# Patient Record
Sex: Female | Born: 1940
Health system: Southern US, Community
[De-identification: ages and names within clinical notes are randomized; demographics above are authoritative.]

## PROBLEM LIST (undated history)

## (undated) DIAGNOSIS — L03113 Cellulitis of right upper limb: Secondary | ICD-10-CM

## (undated) DIAGNOSIS — Z889 Allergy status to unspecified drugs, medicaments and biological substances status: Secondary | ICD-10-CM

## (undated) DIAGNOSIS — K219 Gastro-esophageal reflux disease without esophagitis: Secondary | ICD-10-CM

## (undated) DIAGNOSIS — Z9289 Personal history of other medical treatment: Secondary | ICD-10-CM

## (undated) DIAGNOSIS — Z950 Presence of cardiac pacemaker: Secondary | ICD-10-CM

## (undated) DIAGNOSIS — G43909 Migraine, unspecified, not intractable, without status migrainosus: Secondary | ICD-10-CM

## (undated) DIAGNOSIS — I480 Paroxysmal atrial fibrillation: Secondary | ICD-10-CM

## (undated) DIAGNOSIS — K862 Cyst of pancreas: Secondary | ICD-10-CM

## (undated) DIAGNOSIS — D649 Anemia, unspecified: Secondary | ICD-10-CM

## (undated) DIAGNOSIS — I10 Essential (primary) hypertension: Secondary | ICD-10-CM

## (undated) DIAGNOSIS — R079 Chest pain, unspecified: Secondary | ICD-10-CM

## (undated) DIAGNOSIS — E0590 Thyrotoxicosis, unspecified without thyrotoxic crisis or storm: Secondary | ICD-10-CM

## (undated) DIAGNOSIS — M1990 Unspecified osteoarthritis, unspecified site: Secondary | ICD-10-CM

## (undated) DIAGNOSIS — E232 Diabetes insipidus: Secondary | ICD-10-CM

## (undated) DIAGNOSIS — Z87442 Personal history of urinary calculi: Secondary | ICD-10-CM

## (undated) DIAGNOSIS — F419 Anxiety disorder, unspecified: Secondary | ICD-10-CM

## (undated) DIAGNOSIS — E785 Hyperlipidemia, unspecified: Secondary | ICD-10-CM

## (undated) DIAGNOSIS — C641 Malignant neoplasm of right kidney, except renal pelvis: Secondary | ICD-10-CM

## (undated) DIAGNOSIS — K589 Irritable bowel syndrome without diarrhea: Secondary | ICD-10-CM

## (undated) DIAGNOSIS — C3490 Malignant neoplasm of unspecified part of unspecified bronchus or lung: Secondary | ICD-10-CM

## (undated) DIAGNOSIS — IMO0002 Reserved for concepts with insufficient information to code with codable children: Secondary | ICD-10-CM

## (undated) HISTORY — DX: Essential (primary) hypertension: I10

## (undated) HISTORY — DX: Paroxysmal atrial fibrillation: I48.0

## (undated) HISTORY — DX: Migraine, unspecified, not intractable, without status migrainosus: G43.909

## (undated) HISTORY — DX: Reserved for concepts with insufficient information to code with codable children: IMO0002

## (undated) HISTORY — DX: Personal history of urinary calculi: Z87.442

## (undated) HISTORY — PX: CERVICAL SPINE SURGERY: SHX589

## (undated) HISTORY — PX: HAMMER TOE SURGERY: SHX385

## (undated) HISTORY — PX: BREAST SURGERY: SHX581

## (undated) HISTORY — PX: HERNIA REPAIR: SHX51

## (undated) HISTORY — DX: Personal history of other medical treatment: Z92.89

## (undated) HISTORY — DX: Hyperlipidemia, unspecified: E78.5

## (undated) HISTORY — DX: Diabetes insipidus: E23.2

## (undated) HISTORY — DX: Irritable bowel syndrome, unspecified: K58.9

## (undated) HISTORY — DX: Anemia, unspecified: D64.9

## (undated) HISTORY — PX: TUBAL LIGATION: SHX77

## (undated) HISTORY — PX: ABDOMINAL HYSTERECTOMY: SHX81

## (undated) HISTORY — DX: Cellulitis of right upper limb: L03.113

## (undated) HISTORY — PX: COLONOSCOPY: SHX174

---
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
## 2000-11-02 ENCOUNTER — Emergency Department (HOSPITAL_COMMUNITY): Admission: EM | Admit: 2000-11-02 | Discharge: 2000-11-02 | Payer: Self-pay

## 2002-11-17 ENCOUNTER — Encounter: Payer: Self-pay | Admitting: Cardiology

## 2002-11-23 ENCOUNTER — Encounter: Payer: Self-pay | Admitting: *Deleted

## 2002-11-23 ENCOUNTER — Ambulatory Visit (HOSPITAL_COMMUNITY): Admission: RE | Admit: 2002-11-23 | Discharge: 2002-11-24 | Payer: Self-pay | Admitting: Cardiology

## 2002-11-23 ENCOUNTER — Encounter: Payer: Self-pay | Admitting: Cardiology

## 2002-12-08 ENCOUNTER — Encounter: Payer: Self-pay | Admitting: Cardiology

## 2003-08-06 HISTORY — PX: BREAST EXCISIONAL BIOPSY: SUR124

## 2004-04-06 ENCOUNTER — Ambulatory Visit (HOSPITAL_COMMUNITY): Admission: RE | Admit: 2004-04-06 | Discharge: 2004-04-06 | Payer: Self-pay | Admitting: Internal Medicine

## 2004-09-06 ENCOUNTER — Ambulatory Visit: Payer: Self-pay | Admitting: Internal Medicine

## 2004-09-07 ENCOUNTER — Ambulatory Visit (HOSPITAL_COMMUNITY): Admission: RE | Admit: 2004-09-07 | Discharge: 2004-09-07 | Payer: Self-pay | Admitting: Internal Medicine

## 2004-12-17 ENCOUNTER — Ambulatory Visit: Payer: Self-pay | Admitting: Internal Medicine

## 2004-12-24 ENCOUNTER — Encounter: Payer: Self-pay | Admitting: Cardiology

## 2005-04-01 ENCOUNTER — Ambulatory Visit (HOSPITAL_COMMUNITY): Admission: RE | Admit: 2005-04-01 | Discharge: 2005-04-01 | Payer: Self-pay | Admitting: Internal Medicine

## 2005-04-23 ENCOUNTER — Ambulatory Visit (HOSPITAL_COMMUNITY): Admission: RE | Admit: 2005-04-23 | Discharge: 2005-04-23 | Payer: Self-pay | Admitting: Ophthalmology

## 2005-07-11 ENCOUNTER — Ambulatory Visit: Payer: Self-pay | Admitting: Internal Medicine

## 2006-02-19 ENCOUNTER — Ambulatory Visit: Payer: Self-pay | Admitting: Internal Medicine

## 2006-02-25 ENCOUNTER — Ambulatory Visit (HOSPITAL_COMMUNITY): Admission: RE | Admit: 2006-02-25 | Discharge: 2006-02-25 | Payer: Self-pay | Admitting: Internal Medicine

## 2006-07-11 ENCOUNTER — Ambulatory Visit: Payer: Self-pay | Admitting: Internal Medicine

## 2006-09-04 ENCOUNTER — Ambulatory Visit (HOSPITAL_COMMUNITY): Admission: RE | Admit: 2006-09-04 | Discharge: 2006-09-04 | Payer: Self-pay | Admitting: Internal Medicine

## 2006-09-04 ENCOUNTER — Ambulatory Visit: Payer: Self-pay | Admitting: Internal Medicine

## 2006-09-19 ENCOUNTER — Ambulatory Visit: Payer: Self-pay | Admitting: Internal Medicine

## 2007-01-19 ENCOUNTER — Ambulatory Visit: Payer: Self-pay | Admitting: Gastroenterology

## 2007-12-23 ENCOUNTER — Encounter: Admission: RE | Admit: 2007-12-23 | Discharge: 2007-12-23 | Payer: Self-pay | Admitting: Gastroenterology

## 2008-02-17 ENCOUNTER — Encounter: Admission: RE | Admit: 2008-02-17 | Discharge: 2008-02-17 | Payer: Self-pay | Admitting: Gastroenterology

## 2008-05-11 ENCOUNTER — Ambulatory Visit (HOSPITAL_COMMUNITY): Admission: RE | Admit: 2008-05-11 | Discharge: 2008-05-11 | Payer: Self-pay | Admitting: Internal Medicine

## 2008-06-09 ENCOUNTER — Ambulatory Visit (HOSPITAL_COMMUNITY): Admission: RE | Admit: 2008-06-09 | Discharge: 2008-06-09 | Payer: Self-pay | Admitting: Internal Medicine

## 2008-07-06 ENCOUNTER — Encounter: Admission: RE | Admit: 2008-07-06 | Discharge: 2008-07-06 | Payer: Self-pay | Admitting: Gastroenterology

## 2008-08-19 ENCOUNTER — Ambulatory Visit (HOSPITAL_COMMUNITY): Admission: RE | Admit: 2008-08-19 | Discharge: 2008-08-19 | Payer: Self-pay | Admitting: Gastroenterology

## 2008-08-22 ENCOUNTER — Ambulatory Visit (HOSPITAL_COMMUNITY): Admission: RE | Admit: 2008-08-22 | Discharge: 2008-08-22 | Payer: Self-pay | Admitting: Ophthalmology

## 2009-05-15 ENCOUNTER — Ambulatory Visit: Payer: Self-pay | Admitting: Endocrinology

## 2009-05-15 DIAGNOSIS — IMO0002 Reserved for concepts with insufficient information to code with codable children: Secondary | ICD-10-CM

## 2009-05-15 DIAGNOSIS — E890 Postprocedural hypothyroidism: Secondary | ICD-10-CM

## 2009-05-15 DIAGNOSIS — F17200 Nicotine dependence, unspecified, uncomplicated: Secondary | ICD-10-CM

## 2009-05-15 DIAGNOSIS — E876 Hypokalemia: Secondary | ICD-10-CM

## 2009-05-15 DIAGNOSIS — I10 Essential (primary) hypertension: Secondary | ICD-10-CM

## 2009-05-15 DIAGNOSIS — F411 Generalized anxiety disorder: Secondary | ICD-10-CM

## 2009-05-15 LAB — CONVERTED CEMR LAB: TSH: 0.87 microintl units/mL (ref 0.35–5.50)

## 2009-08-10 ENCOUNTER — Encounter: Admission: RE | Admit: 2009-08-10 | Discharge: 2009-08-10 | Payer: Self-pay | Admitting: Gastroenterology

## 2009-08-21 ENCOUNTER — Encounter: Payer: Self-pay | Admitting: Cardiology

## 2009-09-18 ENCOUNTER — Encounter: Payer: Self-pay | Admitting: Cardiology

## 2009-09-19 ENCOUNTER — Telehealth (INDEPENDENT_AMBULATORY_CARE_PROVIDER_SITE_OTHER): Payer: Self-pay | Admitting: *Deleted

## 2009-09-27 ENCOUNTER — Encounter (INDEPENDENT_AMBULATORY_CARE_PROVIDER_SITE_OTHER): Payer: Self-pay | Admitting: *Deleted

## 2009-09-27 ENCOUNTER — Ambulatory Visit: Payer: Self-pay | Admitting: Cardiology

## 2009-09-27 DIAGNOSIS — I340 Nonrheumatic mitral (valve) insufficiency: Secondary | ICD-10-CM | POA: Insufficient documentation

## 2009-09-27 DIAGNOSIS — R0789 Other chest pain: Secondary | ICD-10-CM

## 2009-10-03 ENCOUNTER — Encounter: Payer: Self-pay | Admitting: Cardiology

## 2009-10-03 ENCOUNTER — Ambulatory Visit: Payer: Self-pay | Admitting: Cardiology

## 2009-10-10 ENCOUNTER — Encounter: Payer: Self-pay | Admitting: Cardiology

## 2009-10-11 ENCOUNTER — Ambulatory Visit (HOSPITAL_COMMUNITY): Admission: RE | Admit: 2009-10-11 | Discharge: 2009-10-11 | Payer: Self-pay | Admitting: Unknown Physician Specialty

## 2009-10-12 ENCOUNTER — Encounter (INDEPENDENT_AMBULATORY_CARE_PROVIDER_SITE_OTHER): Payer: Self-pay | Admitting: *Deleted

## 2009-10-17 ENCOUNTER — Encounter: Payer: Self-pay | Admitting: Cardiology

## 2009-10-17 ENCOUNTER — Ambulatory Visit: Payer: Self-pay | Admitting: Cardiology

## 2009-10-30 ENCOUNTER — Telehealth (INDEPENDENT_AMBULATORY_CARE_PROVIDER_SITE_OTHER): Payer: Self-pay | Admitting: *Deleted

## 2010-09-04 NOTE — Assessment & Plan Note (Signed)
Summary: np-cardiac eval   Visit Type:  Initial Consult Referring Provider:  Dr. Ebbie Ridge Primary Provider:  [name], MD  CC:  Heart Murmur/HTN.  History of Present Illness: The patient is referred for cardiac evaluation after she was noted to have a heart murmur and difficult to control hypertension. Her past cardiac history includes a catheterization demonstrating mild coronary plaque in 2004.  She recently saw a new primary physician in [place] where her daughter lives. The predominant complaint has been weight loss. She does describe some chest "tightness". This happened sporadically. She also has a "catching" discomfort that happens more with certain movements. The chest tightness is mild and sporadic. It is short lived and not associated with nausea vomiting or diaphoresis. She does not describe palpitations, presyncope or syncope. She does not describe shortness of breath, PND or orthopnea. Of note she reports fluctuating blood pressures but it sounds like it typically runs slightly high despite her ongoing therapy.  Preventive Screening-Counseling & Management  Alcohol-Tobacco     Smoking Status: current     Packs/Day: 0.5  Comments: Smoked off/on about 25 yrs  Current Medications (verified): 1)  Potassium Chloride Crys Cr 20 Meq Cr-Tabs (Potassium Chloride Crys Cr) .... Take Two Tablets By Mouth Twice A Day 2)  Propranolol Hcl 80 Mg Tabs (Propranolol Hcl) .... Take 1 Tablet By Mouth Once A Day 3)  Alprazolam 0.5 Mg Tabs (Alprazolam) .Marland Kitchen.. 1 Tab 3 Times Daily 4)  Indapamide 2.5 Mg Tabs (Indapamide) .... Take 1 Tablet By Mouth Once A Day 5)  Levothyroxine Sodium 88 Mcg Tabs (Levothyroxine Sodium) .... Take 1 Tablet By Mouth Once A Day 6)  Aspirin 81 Mg Tbec (Aspirin) .... Take One Tablet By Mouth Daily 7)  Super Calcium/d 600-125 Mg-Unit Tabs (Calcium-Vitamin D) .... Take 2 Tablets By Mouth Once Daily 8)  Fish Oil 1000 Mg Caps (Omega-3 Fatty Acids) .... Take 1  Capsule By Mouth Once A Day 9)  Tylenol 325 Mg Tabs (Acetaminophen) .... As Needed 10)  Amlodipine Besylate 2.5 Mg Tabs (Amlodipine Besylate) .... Take 1 Tablet By Mouth Once A Day  Allergies (verified): 1)  Codeine 2)  * Tetanus  Comments:  Nurse/Medical Assistant: The patient's medications were reviewed with the patient and were updated in the Medication List. Pt brought medication bottles to office visit.  Cyril Loosen, RN, BSN (September 27, 2009 1:51 PM)  Past History:  Past Medical History: HTN x years IBS Herniated Disc Hyperthyroid  Past Surgical History: Tubal Ligation Partial hysterectomy Cervical spine surgery Hammer toe surgery bilateral  Family History: Father "heart bursted" age 72.  Mother died age  85 sudden death.  Two daughters  have uncertain type of thyroid problems  Social History: Widowed Retired Four children Tobacco 1/2 ppd (previous 1ppd) x off and on since age 20sSmoking Status:  current Packs/Day:  0.5  Review of Systems       Wt loss (10 pounds 5 years), insomnia, polyuria.  Otherwis as stated in the HPI and negative for all other systems.  Vital Signs:  Patient profile:   70 year old female Height:      63 inches Weight:      113.50 pounds Pulse rate:   61 / minute BP sitting:   172 / 99  (left arm) Cuff size:   regular  Vitals Entered By: Cyril Loosen, RN, BSN (September 27, 2009 1:45 PM)  Serial Vital Signs/Assessments:  Time      Position  BP  Pulse  Resp  Temp     By 2:42 PM             175/96   60                    Carlye Grippe  CC: Heart Murmur/HTN   Physical Exam  General:  Well developed, well nourished, in no acute distress. Head:  normocephalic and atraumatic Eyes:  PERRLA/EOM intact; conjunctiva and lids normal. Mouth:  Edentulousl. Oral mucosa normal. Neck:  Neck supple, no JVD. No masses, thyromegaly or abnormal cervical nodes. Chest Wall:  no deformities or breast masses noted Lungs:  Clear  bilaterally to auscultation and percussion. Abdomen:  Bowel sounds positive; abdomen soft and non-tender without masses, organomegaly, or hernias noted. No hepatosplenomegaly. Msk:  Back normal, normal gait. Muscle strength and tone normal. Extremities:  No clubbing or cyanosis. Neurologic:  Alert and oriented x 3. Skin:  Intact without lesions or rashes. Cervical Nodes:  no significant adenopathy Axillary Nodes:  no significant adenopathy Inguinal Nodes:  no significant adenopathy Psych:  Normal affect.   Detailed Cardiovascular Exam  Neck    Carotids: Carotids full and equal bilaterally without bruits.      Neck Veins: Normal, no JVD.    Heart    Inspection: no deformities or lifts noted.      Palpation: normal PMI with no thrills palpable.      Auscultation: regular rate and rhythm, S1, S2 without murmurs, rubs, gallops, or clicks.    Vascular    Abdominal Aorta: no palpable masses, pulsations, or audible bruits.      Femoral Pulses: normal femoral pulses bilaterally.      Pedal Pulses: normal pedal pulses bilaterally.      Radial Pulses: normal radial pulses bilaterally.      Peripheral Circulation: no clubbing, cyanosis, or edema noted with normal capillary refill.     EKG  Procedure date:  09/27/2009  Findings:      sus bradycardia, rate 55, axis within normal limits, intervals within normal limits, no acute ST-T wave changes.  Impression & Recommendations:  Problem # 1:  MURMUR (ICD-785.2) Assessment Unchanged There was a mention of a heart murmur. However, I do not appreciate one. She is thin and perhaps a flow murmur was appreciated when she saw her primary physician but is not present today. No further evaluation for this is warranted.  Problem # 2:  HYPERTENSION (ICD-401.9)  I have suggested the addition of Norvasc 2.5 mg daily to her regimen. Orders: EKG w/ Interpretation (93000) GXT (GXT)  Her updated medication list for this problem includes:     Propranolol Hcl 80 Mg Tabs (Propranolol hcl) .Marland Kitchen... Take 1 tablet by mouth once a day    Indapamide 2.5 Mg Tabs (Indapamide) .Marland Kitchen... Take 1 tablet by mouth once a day    Aspirin 81 Mg Tbec (Aspirin) .Marland Kitchen... Take one tablet by mouth daily    Amlodipine Besylate 2.5 Mg Tabs (Amlodipine besylate) .Marland Kitchen... Take 1 tablet by mouth once a day  Problem # 3:  CHEST DISCOMFORT (ICD-786.59) She does have some chest discomfort. It is atypical. I think the pretest probability of obstructive coronary disease is low. Plain old exercise treadmill testing(POET) is indicated  Problem # 4:  SMOKER (ICD-305.1) She understands the need to stop smoking.  Patient Instructions: 1)  Your physician has requested that you have an exercise tolerance test.  For further information please visit [email or web address]  Please also follow instruction sheet,  as given. 2)  Your physician has recommended you make the following change in your medication: START AMLODIPINE BESYLATE 2.5MG  DAILY. This medication has been sent to your pharmacy. 3)  No follow up appointment needed. Prescriptions: AMLODIPINE BESYLATE 2.5 MG TABS (AMLODIPINE BESYLATE) Take 1 tablet by mouth once a day  #30 x 6   Entered by:   Carlye Grippe   Authorized by:   Rollene Rotunda, MD, [facility]   Signed by:   Carlye Grippe on 09/27/2009   Method used:   Electronically to        [facility]* (retail)       [address]       [place], [place]  [address]       Ph: [phone]       Fax: [phone]   RxID:   [id number]   Appended Document: np-cardiac eval faxed to Mickel Fuchs.

## 2010-09-04 NOTE — Letter (Signed)
Summary: Graded Exercise Tolerance Test  [facility] [address]   [place], [place] [address]   Phone: [phone]  Fax: [phone]      [facility] Cardiovascular Services  Graded Exercise Tolerance Test    [facility]  Appointment Date:_  Appointment Time:_   Your doctor has ordered a stress test to help determine the condition of your  heart during exercise. If you take blood pressure medicine , ask your doctor if you should take it the day of your test.  YOU MAY TAKE YOUR MEDICATION THE DAY OF YOUR TEST.You may eat a light meal before your test.  Please be sure to bring the copy of your order with you.   You should dress comfortably, for example: Sweat pants, shorts, or skirt, Loose                                                                                                       short sleeved T-shirt, Rubber soled lace-up shoes (tennis shoes)  You will need to arrive 15 minutes before your appointment time. You will also need to enter at the Main Entrance of the hospital and go to the registration desk. They will direct you to the Cardiovascular Department on the third floor.  You will need to plan on being at the hospital for one hour from registration for this appointment.

## 2010-09-04 NOTE — Letter (Signed)
Summary: External Correspondence/ OFFICE NOTES 09/18/09 THRU 09/10/07 DAY  External Correspondence/ OFFICE NOTES 09/18/09 THRU 09/10/07 DAYSPRINGS   Imported By: Dorise Hiss 09/25/2009 16:35:01  _____________________________________________________________________  External Attachment:    Type:   Image     Comment:   External Document

## 2010-09-04 NOTE — Progress Notes (Signed)
Summary: Office Visit  Office Visit   Imported By: Zachary George 09/26/2009 16:54:21  _____________________________________________________________________  External Attachment:    Type:   Image     Comment:   External Document

## 2010-09-04 NOTE — Progress Notes (Signed)
Summary: Records request from [facility]  Request for records received from [facility]. Request forwarded to [employer]. Wilder Glade  September 19, 2009 4:31 PM

## 2010-09-04 NOTE — Progress Notes (Signed)
Summary: returning call  Phone Note Call from Patient Call back at Home Phone [phone]   Caller: Patient Call For: Vicky Summary of Call: calling to get test scheduled. Initial call taken by: Carlye Grippe,  October 30, 2009 4:53 PM  Follow-up for Phone Call        called and left a message with Patient. Patient had her Lexiscan test done what else am I suppose to be scheduling? Follow-up by: Zachary George,  November 03, 2009 9:39 AM  Additional Follow-up for Phone Call Additional follow up Details #1::        patient informed via message machine of normal stress test results.  Additional Follow-up by: Carlye Grippe,  November 07, 2009 9:13 AM

## 2010-09-04 NOTE — Cardiovascular Report (Signed)
Summary: Cardiac Cath Other  Cardiac Cath Other   Imported By: Zachary George 09/26/2009 16:55:02  _____________________________________________________________________  External Attachment:    Type:   Image     Comment:   External Document

## 2010-09-04 NOTE — Letter (Signed)
Summary: [facility], [facility] at [facility] [address]   [place], [place] [address]   Phone: [phone]  Fax: [phone]    10/12/2009  [facility] [address] [place], [place]  [address]  Dear Ms. Sublett,   Enclosed is a copy of your Hospital order for the [facility] that was ordered by Dr. Antoine Poche.  The hospital does not schedule these test in the afternoons. If the date is not suitable with you please call our office at [phone] ext [phone] and I will be happy to re-schedule. I was unable to reach you by phone.           Sincerely,   Zachary George Patient Care Coordinator

## 2010-09-04 NOTE — Miscellaneous (Signed)
Summary: LEXISCAN ORDER  Clinical Lists Changes  Orders: Added new Referral order of Nuclear Med (Nuc Med) - Signed

## 2010-09-04 NOTE — Progress Notes (Signed)
Summary: Office Visit  Office Visit   Imported By: Zachary George 09/26/2009 16:55:29  _____________________________________________________________________  External Attachment:    Type:   Image     Comment:   External Document

## 2010-12-18 NOTE — Assessment & Plan Note (Signed)
NAMEARTHURINE, Nichols                  CHART#:  [id number]   DATE:  01/19/2007                       DOB:  06-29-1941   CHIEF COMPLAINT:  Abdominal soreness and nausea.   SUBJECTIVE:  Caroline Nichols is a 70 year old female who has had extensive  workup for chronic abdominal pain.  Please see Dr. Tiffany Kocher  September 19, 2006, office note and Dr. Patty Sermons detailed note from  September 04, 2006.  She has history of adrenal insufficiency.  She also  has history of cystic lesion in the head of the pancreas.  She had a CT  of the abdomen with and without contrast at [facility].  She was found to have two tiny cystic  lesions in the pancreas, possibly pseudocyst or small cystic neoplasms,  nephrolithiasis, renal cysts and atherosclerosis.  She had a recent  hydrogen breath test for bacterial overgrowth which was negative.  It is  quite concerning that she is having such severe hypoglycemia.  She tells  me she continues to have episodes of sweating.  She has discontinued her  Metamucil due to loose stools.  She was having 5-6 a day, now she is  having about two per day since she stopped.  She denies any rectal  bleeding or melena.  She complains of severe upper abdominal pain.  She  also has bilateral lower quadrant pain with defecation.  She describes  numbness in her mouth and dry mouth.  She tells me she is off Librax as  she ran out, and she tells me it did not make a difference.  Therefore,  she does not care to resume it.  She tells me she has had weight loss.  She is eating three meals a day, occasional snacks.  She complains of a  significant amount of gas and bloating.  Denies any fever or chills.   CURRENT MEDICATIONS:  Take the list from January 18, 2007.   ALLERGIES:  CODEINE, TETANUS.   OBJECTIVE:  VITAL SIGNS:  Weight 110.5 pounds, height 62-1/2 inches,  temperature 98.5, blood pressure 122/78, pulse 72.  GENERAL:  Caroline Nichols is an elderly female  who is alert, oriented,  pleasant, cooperative and in no acute distress.  HEENT:  Pupils are clear, nonicteric.  Conjunctivae are pink.  Oropharynx pink and moist without any lesions.  CHEST:  Heart regular rate and rhythm.  Normal S1, S2.  ABDOMEN:  Positive bowel sounds x4.  No bruits auscultated.  Soft,  nondistended.  She does have moderate tenderness to her entire abdomen  on deep palpation.  There is no rebound tenderness or guarding.  No  hepatosplenomegaly or mass.  EXTREMITIES:  Without clubbing or edema bilaterally.   IMPRESSION:  Caroline Nichols is a 70 year old female with multiple issues.  She had symptoms of weakness.  She has severe episodes of hypoglycemia.  She has possible history of adrenal insufficiency, but has had two  normal fasting cortisol levels on prednisone therapy recently.  She has  been seen in conjunction with [facility] and had a CT with pancreatic protocol which shows two tiny cystic  lesions in the pancreas, possible pseudocyst or small cystic neoplasms,  nephrolithiasis, renal cysts and atherosclerosis.  I feel IBS could be  contributing but not necessarily  the cause of all of her symptoms.   PLAN:  1. Irritable bowel syndrome literature given for her review.  2. Would like her to increase her Levsin to before meals and at      bedtime.  3. I would like her to be seen and evaluated by Dr. Karilyn Cota, and she is      choosing to follow him to [place], [place].       Caroline Nichols, N.P.  Electronically Signed     Caroline Nichols, M.D.  Electronically Signed    KJ/MEDQ  D:  01/19/2007  T:  01/20/2007  Job:  [id number]   cc:   [name]

## 2010-12-21 NOTE — Discharge Summary (Signed)
NAME:  Caroline Nichols, Caroline Nichols                           ACCOUNT NO.:  [id number]   MEDICAL RECORD NO.:  [id number]                   PATIENT TYPE:  OIB   LOCATION:  4731                                 FACILITY:  [facility]   PHYSICIAN:  Salvadore Farber, M.D.             DATE OF BIRTH:  1940-11-25   DATE OF ADMISSION:  11/23/2002  DATE OF DISCHARGE:                           DISCHARGE SUMMARY - REFERRING   PROCEDURE:  1. Cardiac catheterization.  2. Left ventriculogram.   HOSPITAL COURSE:  Caroline Nichols is a 70 year old female with no known history of  coronary artery disease. She was referred to Dr. Andee Lineman by Dr. Dimas Aguas for  substernal chest pain dating back about a month. She smokes about a half a  pack a day, and her cardiac risk factors include hypertension. She also has  a family history of premature coronary artery disease. She was evaluated by  Dr. Andee Lineman who felt with suspicious symptoms and multiple cardiac risk  factors she needed further evaluation and catheterization. She was admitted  for catheterization on 11/23/02.   A cardiac catheterization showed mild disease in the left main LAD. The  circumflex had three OMs and there was a 30% stenosis in the mid OM, and the  circumflex was dominant. The RCA was nondominant and had no critical  disease. Her EF was greater than 55% with no wall motion abnormalities and  no MR. Medical management was recommended.   The patient spiked a temperature to 101.5 which responded to Tylenol and did  this again the following day. A UA was performed which showed only trace  blood, 0 to 2 WBCs, 0 to 2 RBCs, and rare bacteria. It was felt that this  does not indicated a urinary tract infection. Chest x-ray pending at the  time of dictation, but the patient's lungs are clear, and her oxygen  saturation is 96% on room air. She does have a productive cough. She was  felt that she was stable for discharge with her chest x-ray to be followed  up and the  patient to see Dr. Dimas Aguas later this week if her symptoms do not  resolve. Of note, her potassium post catheterization was 3.1, and she is on  potassium at 20 mEq b.i.d. on a daily basis, but she will receive a total of  80 mEq today and then resume her home dose with followup with Dr. Dimas Aguas.  Caroline Nichols was considered stable for discharge on 11/24/02.   LABORATORY DATA:  Hemoglobin 13.4, hematocrit 37.7, WBCs 4.5, platelets 197.  Sodium 137, potassium 3.1, chloride 102, CO2 31, BUN 9, creatinine 0.9,  glucose 102.   DISCHARGE CONDITION:  Stable.   DISCHARGE DIAGNOSES:  1. Substernal chest pain, no critical coronary artery disease by     catheterization this admission with medical therapy and risk factor     reduction recommended.  2. Ongoing tobacco use.  3. Hypertension.  4. Dyslipidemia.  5. Hypokalemia.  6. Family history of premature coronary artery disease.  7. Intolerance to tetanus vaccine and codeine.  8. History of hypothyroidism.  9. History of mitral valve prolapse.  10.      Status post tubal ligation, hysterectomy, and herniated disk     repair.   DISCHARGE INSTRUCTIONS:  Her activity level was to include no driving,  sexual, or strenuous activity for two days. She is to call the office with  problems with the catheterization site. She is to follow up with Dr. Dimas Aguas  as needed. She has an appointment with the P.A. or Dr. Andee Lineman on 5/5 at  1:30.   DISCHARGE MEDICATIONS:  1. Lipitor 10 mg q.d.  2. K-Dur 20 mEq b.i.d.  3. Citrucel q.d.  4. Vitamins q.d.  5. Trazodone b.i.d.  6. ________  XL 80 mg q.d.  7. Lozol 2.5 mg q.d.  8. Premarin q.d.  9. Xanax q.h.s.  10.      Synthroid 125 mcg q.d.     Caroline Nichols, P.A. [facility]                  Salvadore Farber, M.D.    RG/MEDQ  D:  11/24/2002  T:  11/25/2002  Job:  [id number]   cc:   [name]  [address]  [place]  [place] [address]  Fax: [phone]

## 2010-12-21 NOTE — Cardiovascular Report (Signed)
NAME:  Caroline Nichols, Caroline Nichols                           ACCOUNT NO.:  [id number]   MEDICAL RECORD NO.:  [id number]                   PATIENT TYPE:  OIB   LOCATION:  2899                                 FACILITY:  [facility]   PHYSICIAN:  Veneda Melter, M.D.                   DATE OF BIRTH:  04-14-1941   DATE OF PROCEDURE:  11/23/2002  DATE OF DISCHARGE:                              CARDIAC CATHETERIZATION   PROCEDURES PERFORMED:  1. Left heart catheterization.  2. Left ventriculogram.  3. Selective coronary angiography.   DIAGNOSES:  1. Mild coronary artery disease by angiogram.  2. Normal left ventricular systolic function.   HISTORY:  The patient is a 70 year old black female who presents with  substernal chest discomfort occurring with exertion.  The patient has a  history of hypertension and tobacco use as well as a family history of  coronary artery disease, and presented with increasing chest discomfort and  shortness of breath with activity.  She has had a negative stress test  several years ago, however, due to recurrence and progression of symptoms,  she is referred for cardiac assessment.   TECHNIQUE:  Informed consent was obtained.  The patient was brought to the  catheterization lab.  A 6-French sheath was placed in the right femoral  artery using the modified Seldinger technique.  Then 6-French JL4 and JR4  catheters were then used to engage the left and right coronary arteries, and  selective angiography performed in various projections using manual  injection of contrast.  A 6-French pigtail catheter was then advanced to the  left ventricle and a left ventriculogram performed using power injections of  contrast.  At the termination of the case, the catheters and sheath were  removed, and manual pressure applied until adequate hemostasis was achieved.  The patient tolerated the procedure well and was transferred to the floor in  stable condition.   FINDINGS:  1. Left Main  Trunk:  Large-caliber vessel with mild irregularities of 15% in     the mid section.  2. Left Anterior Descending:  This begins as a large-caliber vessel and     tapers as it wraps around the apex.  It provides several small diagonal     branches in the mid section.  The LAD has mild disease of 20% in the     proximal segment.  There is mild diffuse disease of 20% in the mid and     distal sections.  Corkscrewing of the vessel is noted distally consistent     with hypertrophic heart disease.  3. Left Circumflex Artery:  This is a medium-caliber vessel and provides a     large first marginal branch and proximal segment, 2 smaller marginal     branches distally.  The left circumflex system is dominant.  There is     diffuse narrowings of 30% in the AV circumflex  as well as mild disease in     the marginal branches.  4. Right Coronary Artery:  The right coronary artery is nondominant.  It is     a small-caliber vessel that provides 2 RV marginal branches.  There are     mild irregularities in the right coronary artery.  5. Left Ventriculogram:  Normal end-systolic and end-diastolic dimensions.     Overall, left ventricular function is well preserved.  Ejection fraction     is greater than 55%.  No mitral regurgitation.  LV pressure is 140/zero,     aortic is 140/70.  LVEDP equals 15.    ASSESSMENT AND PLAN:  The patient is a 70 year old female with noncritical  coronary artery disease and well-preserved left ventricular function.  Continue medical therapy and aggressive risk factor modification will be  pursued.                                               Veneda Melter, M.D.    NG/MEDQ  D:  11/23/2002  T:  11/24/2002  Job:  [id number]   cc:   [name]  [address]  [place]  [place] [address]  Fax: [phone]   Learta Codding, M.D. [facility]  [address]  [address]  [place]  [place] [address]

## 2011-05-06 LAB — MISCELLANEOUS TEST

## 2011-05-06 LAB — BASIC METABOLIC PANEL
CO2: 28
Calcium: 9.4
Chloride: 102
Creatinine, Ser: 0.62
GFR calc Af Amer: >60
GFR calc non Af Amer: >60
Glucose, Bld: 82
Sodium: 137

## 2011-05-06 LAB — CORTISOL: Cortisol, Plasma: 8.7

## 2011-05-07 LAB — GASTRIN: Gastrin: 48 pg/mL (ref 13–115)

## 2012-02-28 ENCOUNTER — Encounter: Payer: Self-pay | Admitting: *Deleted

## 2012-02-28 DIAGNOSIS — K589 Irritable bowel syndrome without diarrhea: Secondary | ICD-10-CM | POA: Insufficient documentation

## 2012-02-28 DIAGNOSIS — M47816 Spondylosis without myelopathy or radiculopathy, lumbar region: Secondary | ICD-10-CM | POA: Insufficient documentation

## 2012-06-26 ENCOUNTER — Encounter (INDEPENDENT_AMBULATORY_CARE_PROVIDER_SITE_OTHER): Payer: Self-pay | Admitting: Internal Medicine

## 2012-06-26 DIAGNOSIS — D72819 Decreased white blood cell count, unspecified: Secondary | ICD-10-CM

## 2012-06-26 DIAGNOSIS — D509 Iron deficiency anemia, unspecified: Secondary | ICD-10-CM

## 2012-06-26 DIAGNOSIS — R634 Abnormal weight loss: Secondary | ICD-10-CM

## 2012-06-26 DIAGNOSIS — E039 Hypothyroidism, unspecified: Secondary | ICD-10-CM

## 2012-07-06 DIAGNOSIS — D509 Iron deficiency anemia, unspecified: Secondary | ICD-10-CM

## 2012-07-13 ENCOUNTER — Encounter: Payer: Medicare Other | Admitting: Internal Medicine

## 2012-07-13 DIAGNOSIS — K5520 Angiodysplasia of colon without hemorrhage: Secondary | ICD-10-CM

## 2012-07-13 DIAGNOSIS — K909 Intestinal malabsorption, unspecified: Secondary | ICD-10-CM

## 2012-07-13 DIAGNOSIS — D72819 Decreased white blood cell count, unspecified: Secondary | ICD-10-CM

## 2012-07-13 DIAGNOSIS — D509 Iron deficiency anemia, unspecified: Secondary | ICD-10-CM

## 2012-07-20 DIAGNOSIS — D509 Iron deficiency anemia, unspecified: Secondary | ICD-10-CM

## 2012-10-21 ENCOUNTER — Encounter: Payer: Medicare Other | Admitting: Internal Medicine

## 2012-11-11 ENCOUNTER — Encounter (INDEPENDENT_AMBULATORY_CARE_PROVIDER_SITE_OTHER): Payer: Medicare Other

## 2012-11-11 DIAGNOSIS — D649 Anemia, unspecified: Secondary | ICD-10-CM

## 2012-11-11 DIAGNOSIS — D72819 Decreased white blood cell count, unspecified: Secondary | ICD-10-CM

## 2012-11-11 DIAGNOSIS — E119 Type 2 diabetes mellitus without complications: Secondary | ICD-10-CM

## 2012-12-09 DIAGNOSIS — N951 Menopausal and female climacteric states: Secondary | ICD-10-CM | POA: Insufficient documentation

## 2012-12-09 DIAGNOSIS — M4322 Fusion of spine, cervical region: Secondary | ICD-10-CM | POA: Insufficient documentation

## 2012-12-09 DIAGNOSIS — R634 Abnormal weight loss: Secondary | ICD-10-CM | POA: Insufficient documentation

## 2012-12-09 DIAGNOSIS — K922 Gastrointestinal hemorrhage, unspecified: Secondary | ICD-10-CM | POA: Insufficient documentation

## 2012-12-09 DIAGNOSIS — G629 Polyneuropathy, unspecified: Secondary | ICD-10-CM | POA: Insufficient documentation

## 2012-12-09 DIAGNOSIS — R001 Bradycardia, unspecified: Secondary | ICD-10-CM | POA: Insufficient documentation

## 2012-12-09 DIAGNOSIS — M25469 Effusion, unspecified knee: Secondary | ICD-10-CM | POA: Insufficient documentation

## 2012-12-09 DIAGNOSIS — IMO0001 Reserved for inherently not codable concepts without codable children: Secondary | ICD-10-CM | POA: Insufficient documentation

## 2012-12-09 DIAGNOSIS — R358 Other polyuria: Secondary | ICD-10-CM | POA: Insufficient documentation

## 2012-12-09 DIAGNOSIS — F17200 Nicotine dependence, unspecified, uncomplicated: Secondary | ICD-10-CM | POA: Insufficient documentation

## 2012-12-09 DIAGNOSIS — Z87891 Personal history of nicotine dependence: Secondary | ICD-10-CM | POA: Insufficient documentation

## 2012-12-26 ENCOUNTER — Emergency Department (HOSPITAL_COMMUNITY): Payer: Medicare Other

## 2012-12-26 ENCOUNTER — Inpatient Hospital Stay (HOSPITAL_COMMUNITY)
Admission: EM | Admit: 2012-12-26 | Discharge: 2013-01-04 | DRG: 602 | Disposition: A | Discharge status: Home Health | Payer: Medicare Other | Attending: Internal Medicine | Admitting: Internal Medicine

## 2012-12-26 ENCOUNTER — Encounter (HOSPITAL_COMMUNITY): Payer: Self-pay | Admitting: *Deleted

## 2012-12-26 DIAGNOSIS — L03119 Cellulitis of unspecified part of limb: Principal | ICD-10-CM

## 2012-12-26 DIAGNOSIS — I498 Other specified cardiac arrhythmias: Secondary | ICD-10-CM | POA: Diagnosis not present

## 2012-12-26 DIAGNOSIS — L02519 Cutaneous abscess of unspecified hand: Principal | ICD-10-CM | POA: Diagnosis present

## 2012-12-26 DIAGNOSIS — E876 Hypokalemia: Secondary | ICD-10-CM

## 2012-12-26 DIAGNOSIS — E41 Nutritional marasmus: Secondary | ICD-10-CM | POA: Diagnosis present

## 2012-12-26 DIAGNOSIS — Y9210 Unspecified residential institution as the place of occurrence of the external cause: Secondary | ICD-10-CM | POA: Diagnosis not present

## 2012-12-26 DIAGNOSIS — Z79899 Other long term (current) drug therapy: Secondary | ICD-10-CM

## 2012-12-26 DIAGNOSIS — IMO0002 Reserved for concepts with insufficient information to code with codable children: Secondary | ICD-10-CM

## 2012-12-26 DIAGNOSIS — M1990 Unspecified osteoarthritis, unspecified site: Secondary | ICD-10-CM

## 2012-12-26 DIAGNOSIS — R0789 Other chest pain: Secondary | ICD-10-CM

## 2012-12-26 DIAGNOSIS — L03011 Cellulitis of right finger: Secondary | ICD-10-CM | POA: Diagnosis present

## 2012-12-26 DIAGNOSIS — R197 Diarrhea, unspecified: Secondary | ICD-10-CM | POA: Diagnosis not present

## 2012-12-26 DIAGNOSIS — D649 Anemia, unspecified: Secondary | ICD-10-CM | POA: Diagnosis not present

## 2012-12-26 DIAGNOSIS — Z7982 Long term (current) use of aspirin: Secondary | ICD-10-CM

## 2012-12-26 DIAGNOSIS — E232 Diabetes insipidus: Secondary | ICD-10-CM | POA: Diagnosis present

## 2012-12-26 DIAGNOSIS — I440 Atrioventricular block, first degree: Secondary | ICD-10-CM | POA: Diagnosis not present

## 2012-12-26 DIAGNOSIS — M25519 Pain in unspecified shoulder: Secondary | ICD-10-CM | POA: Diagnosis present

## 2012-12-26 DIAGNOSIS — F17200 Nicotine dependence, unspecified, uncomplicated: Secondary | ICD-10-CM | POA: Diagnosis present

## 2012-12-26 DIAGNOSIS — M25511 Pain in right shoulder: Secondary | ICD-10-CM

## 2012-12-26 DIAGNOSIS — R079 Chest pain, unspecified: Secondary | ICD-10-CM

## 2012-12-26 DIAGNOSIS — E871 Hypo-osmolality and hyponatremia: Secondary | ICD-10-CM | POA: Diagnosis present

## 2012-12-26 DIAGNOSIS — L03113 Cellulitis of right upper limb: Secondary | ICD-10-CM

## 2012-12-26 DIAGNOSIS — I10 Essential (primary) hypertension: Secondary | ICD-10-CM | POA: Diagnosis present

## 2012-12-26 DIAGNOSIS — K589 Irritable bowel syndrome without diarrhea: Secondary | ICD-10-CM

## 2012-12-26 DIAGNOSIS — L03019 Cellulitis of unspecified finger: Secondary | ICD-10-CM | POA: Diagnosis present

## 2012-12-26 DIAGNOSIS — E890 Postprocedural hypothyroidism: Secondary | ICD-10-CM | POA: Diagnosis present

## 2012-12-26 DIAGNOSIS — K219 Gastro-esophageal reflux disease without esophagitis: Secondary | ICD-10-CM | POA: Diagnosis present

## 2012-12-26 DIAGNOSIS — T368X5A Adverse effect of other systemic antibiotics, initial encounter: Secondary | ICD-10-CM | POA: Diagnosis not present

## 2012-12-26 DIAGNOSIS — F411 Generalized anxiety disorder: Secondary | ICD-10-CM

## 2012-12-26 DIAGNOSIS — M069 Rheumatoid arthritis, unspecified: Secondary | ICD-10-CM | POA: Diagnosis present

## 2012-12-26 DIAGNOSIS — D72819 Decreased white blood cell count, unspecified: Secondary | ICD-10-CM | POA: Diagnosis not present

## 2012-12-26 DIAGNOSIS — R011 Cardiac murmur, unspecified: Secondary | ICD-10-CM

## 2012-12-26 DIAGNOSIS — E039 Hypothyroidism, unspecified: Secondary | ICD-10-CM

## 2012-12-26 HISTORY — DX: Cyst of pancreas: K86.2

## 2012-12-26 HISTORY — DX: Thyrotoxicosis, unspecified without thyrotoxic crisis or storm: E05.90

## 2012-12-26 HISTORY — DX: Unspecified osteoarthritis, unspecified site: M19.90

## 2012-12-26 LAB — CBC WITH DIFFERENTIAL/PLATELET
Basophils Absolute: 0 10*3/uL (ref 0.0–0.1)
HCT: 35.7 % — ABNORMAL LOW (ref 36.0–46.0)
Lymphocytes Relative: 49 % — ABNORMAL HIGH (ref 12–46)
Monocytes Relative: 23 % — ABNORMAL HIGH (ref 3–12)
Neutrophils Relative %: 28 % — ABNORMAL LOW (ref 43–77)
Platelets: 234 10*3/uL (ref 150–400)
RDW: 15.2 % (ref 11.5–15.5)
WBC: 5.1 10*3/uL (ref 4.0–10.5)

## 2012-12-26 LAB — COMPREHENSIVE METABOLIC PANEL
ALT: 14 U/L (ref 0–35)
AST: 18 U/L (ref 0–37)
CO2: 28 mEq/L (ref 19–32)
Chloride: 94 mEq/L — ABNORMAL LOW (ref 96–112)
GFR calc non Af Amer: >90 mL/min (ref >90)
Sodium: 133 mEq/L — ABNORMAL LOW (ref 135–145)
Total Bilirubin: 0.5 mg/dL (ref 0.3–1.2)

## 2012-12-26 LAB — SEDIMENTATION RATE: Sed Rate: 77 mm/hr — ABNORMAL HIGH (ref 0–22)

## 2012-12-26 MED ORDER — VANCOMYCIN HCL IN DEXTROSE 1-5 GM/200ML-% IV SOLN
1000.0000 mg | Freq: Once | INTRAVENOUS | Status: AC
  Administered 2012-12-26: 1000 mg via INTRAVENOUS
  Filled 2012-12-26: qty 200

## 2012-12-26 MED ORDER — MORPHINE SULFATE 4 MG/ML IJ SOLN
2.0000 mg | INTRAMUSCULAR | Status: DC | PRN
  Administered 2012-12-26 (×2): 2 mg via INTRAVENOUS
  Filled 2012-12-26 (×2): qty 1

## 2012-12-26 MED ORDER — SODIUM CHLORIDE 0.9 % IV BOLUS (SEPSIS)
500.0000 mL | Freq: Once | INTRAVENOUS | Status: AC
  Administered 2012-12-26: 500 mL via INTRAVENOUS

## 2012-12-26 MED ORDER — SODIUM CHLORIDE 0.9 % IV SOLN
INTRAVENOUS | Status: DC
  Administered 2012-12-26: 23:00:00 via INTRAVENOUS

## 2012-12-26 NOTE — ED Provider Notes (Signed)
History  This chart was scribed for Ward Givens, MD by Bennett Scrape, ED Scribe. This patient was seen in room APA03/APA03 and the patient's care was started at 6:46 PM.  CSN: [id number]  Arrival date & time 12/26/12  4098   First MD Initiated Contact with Patient 12/26/12 1846      Chief Complaint  Patient presents with  . Wrist Pain     The history is provided by the patient. No language interpreter was used.   HPI Comments: Caroline Nichols is a 72 y.o. female brought in by ambulance, who presents to the Emergency Department complaining of gradual onset, gradually worsening, constant left wrist pain described as aching with associated swelling. Pt states that she drove 3 days ago for 15 to 20 minutes and felt an aching pain in bilateral clavicles shortly afterwards. She reports that she then felt soreness in her entire left arm 2 days ago upon waking with radiation of the pain into her left wrist with swelling today.  She denies any known insect bites or injuries but states that there was a bruise in the right antecubital region earlier in the week after having blood drawnthat resolved on its own. She also had a small bruise on the dorsum of her right hand of uncertain etiology that has resolved.  She denies having itching prior to the onset. She denies having any prior skin infections. She reports prior arthritis diagnoses but denies being on any medications. She states that she experienced chills and a "dry throat" after having her thyroid medication changed from 25 mg to 112 mg in the past week and admits that at baseline her feet "burn and tingle" but she denies any recent fevers, chills or nausea, vomiting as associated symptoms. She does describe loss of appetite. She denies any recent admissions in the past 3 months. Pt is a current everyday smoker but denies alcohol use.  PCP is Dr. Dimas Aguas in [facility] Dr. Avel Peace is endocrinologist.   She is following up with [facility] for the thyroid, next  appointment is on June 11th.  Pt lives alone  Past Medical History  Diagnosis Date  . Hypertension   . Thyroid disease     hyper  . IBS (irritable bowel syndrome)   . Herniated disc   . Pancreatic cyst     Past Surgical History  Procedure Laterality Date  . Tubal ligation    . Cervical spine surgery    . Hammer toe surgery    . Other surgical history      partial hysterectomy  . Hernia repair      History reviewed. No pertinent family history.  History  Substance Use Topics  . Smoking status: Current Every Day Smoker -- 0.50 packs/day    Types: Cigarettes  . Smokeless tobacco: Not on file  . Alcohol Use: No  lives at home Lives alone  OB History   Grav Para Term Preterm Abortions TAB SAB Ect Mult Living   4 4 4              Review of Systems  Constitutional: Negative for fever, chills and appetite change.  Musculoskeletal: Positive for joint swelling (left wrist) and arthralgias (left wrist pain).  Skin: Positive for color change. Negative for wound.  All other systems reviewed and are negative.    Allergies  Codeine and Tetanus toxoid  Home Medications   Current Outpatient Rx  Name  Route  Sig  Dispense  Refill  . ALPRAZolam Prudy Feeler)  0.5 MG tablet   Oral   Take 0.5 mg by mouth 3 (three) times daily as needed for anxiety.          Marland Kitchen aspirin EC 81 MG tablet   Oral   Take 81 mg by mouth daily.         . Calcium-Vitamin D (SUPER CALCIUM/D) 600-125 MG-UNIT TABS   Oral   Take by mouth. Taking 2 daily         . desmopressin (DDAVP) 0.2 MG tablet   Oral   Take 0.2 mg by mouth at bedtime.         Marland Kitchen levothyroxine (SYNTHROID, LEVOTHROID) 112 MCG tablet   Oral   Take 112 mcg by mouth daily before breakfast.         . Omega-3 Fatty Acids (FISH OIL) 1000 MG CAPS   Oral   Take by mouth daily.         . potassium chloride (K-DUR,KLOR-CON) 10 MEQ tablet   Oral   Take 40 mEq by mouth 2 (two) times daily.         . propranolol (INDERAL) 80  MG tablet   Oral   Take 80 mg by mouth daily.           Triage Vitals: BP 123/76  Pulse 49  Temp(Src) 99.3 F (37.4 C) (Oral)  Resp 18  SpO2 99%  Vital signs normal except bradycardia   Physical Exam  Nursing note and vitals reviewed. Constitutional: She is oriented to person, place, and time. No distress.  Frail elderly female  HENT:  Head: Normocephalic and atraumatic.  Eyes: EOM are normal.  Neck: Neck supple. No tracheal deviation present.  Cardiovascular: Normal rate.   Pulmonary/Chest: Effort normal. No respiratory distress.    Erythema and swelling over the clavicular-sternal junction bilaterally   Musculoskeletal: Normal range of motion.       Hands: Flexion of the DIP of the right little finger that's congential (also on LLF). Diffuse erythema and swelling of the proximal middle finger, moderate erythema and swelling of dorsum of the right hand that extends over the ulnar styloid process and radial aspect of the right wrist, erythema extends over the MCP joint of the right thumb, small puncture type erythematous dot in the middle of the right palm, red streak from the right ulnar prominence that is 4 cm long  Lymphadenopathy:    She has no axillary adenopathy.  But the right axilla is tender to palpation  Neurological: She is alert and oriented to person, place, and time.  Skin: Skin is warm and dry.  Psychiatric: She has a normal mood and affect. Her behavior is normal.    ED Course  Procedures (including critical care time)  Medications  morphine 4 MG/ML injection 2 mg (2 mg Intravenous Given 12/26/12 2212)  sodium chloride 0.9 % bolus 500 mL (0 mLs Intravenous Stopped 12/26/12 2230)  vancomycin (VANCOCIN) IVPB 1000 mg/200 mL premix (0 mg Intravenous Stopped 12/26/12 2208)    DIAGNOSTIC STUDIES: Oxygen Saturation is 99% on room air, normal by my interpretation.    COORDINATION OF CARE: 6:58 PM-Discussed treatment plan which includes medications and xrays  of the clavicles and right wrist with pt at bedside and pt agreed to plan.   10:00 PM-Consult complete with Dr. Rito Ehrlich, hospitalist. Patient case explained and discussed. Dr. Rito Ehrlich agrees to evaluate the patient in the ED for further evaluation and treatment. Call ended at 10:04 PM.  Results for orders placed during  the hospital encounter of 12/26/12  CULTURE, BLOOD (ROUTINE X 2)      Result Value Range   Specimen Description LEFT ANTECUBITAL     Special Requests BOTTLES DRAWN AEROBIC AND ANAEROBIC 4CC     Culture PENDING     Report Status PENDING    CULTURE, BLOOD (ROUTINE X 2)      Result Value Range   Specimen Description BLOOD LEFT HAND     Special Requests BOTTLES DRAWN AEROBIC AND ANAEROBIC 4CC     Culture PENDING     Report Status PENDING    CBC WITH DIFFERENTIAL      Result Value Range   WBC 5.1  4.0 - 10.5 K/uL   RBC 3.94  3.87 - 5.11 MIL/uL   Hemoglobin 12.2  12.0 - 15.0 g/dL   HCT 47.8 (*) 29.5 - 62.1 %   MCV 90.6  78.0 - 100.0 fL   MCH 31.0  26.0 - 34.0 pg   MCHC 34.2  30.0 - 36.0 g/dL   RDW 30.8  65.7 - 84.6 %   Platelets 234  150 - 400 K/uL   Neutrophils Relative % 28 (*) 43 - 77 %   Lymphocytes Relative 49 (*) 12 - 46 %   Monocytes Relative 23 (*) 3 - 12 %   Eosinophils Relative 0  0 - 5 %   Basophils Relative 0  0 - 1 %   Neutro Abs 1.4 (*) 1.7 - 7.7 K/uL   Lymphs Abs 2.5  0.7 - 4.0 K/uL   Monocytes Absolute 1.2 (*) 0.1 - 1.0 K/uL   Eosinophils Absolute 0.0  0.0 - 0.7 K/uL   Basophils Absolute 0.0  0.0 - 0.1 K/uL  COMPREHENSIVE METABOLIC PANEL      Result Value Range   Sodium 133 (*) 135 - 145 mEq/L   Potassium 3.3 (*) 3.5 - 5.1 mEq/L   Chloride 94 (*) 96 - 112 mEq/L   CO2 28  19 - 32 mEq/L   Glucose, Bld 111 (*) 70 - 99 mg/dL   BUN 10  6 - 23 mg/dL   Creatinine, Ser 9.62  0.50 - 1.10 mg/dL   Calcium 95.2  8.4 - 84.1 mg/dL   Total Protein 8.2  6.0 - 8.3 g/dL   Albumin 3.5  3.5 - 5.2 g/dL   AST 18  0 - 37 U/L   ALT 14  0 - 35 U/L   Alkaline  Phosphatase 88  39 - 117 U/L   Total Bilirubin 0.5  0.3 - 1.2 mg/dL   GFR calc non Af Amer >90  >90 mL/min   GFR calc Af Amer >90  >90 mL/min  SEDIMENTATION RATE      Result Value Range   Sed Rate 77 (*) 0 - 22 mm/hr   Laboratory interpretation all normal except for natremia, hypokalemia, very elevated sedimentation rate    Dg Clavicle Left  12/26/2012   *RADIOLOGY REPORT*  Clinical Data: Painful swollen sternoclavicular joint and shoulder pain  LEFT CLAVICLE - 2+ VIEWS  Comparison: Chest radiograph 10/22/2012  Findings: There is mild degenerative change at the acromioclavicular joint.  Artifact overlies the distal clavicle on one view.  There is no clavicular destruction or fracture.  Mild left glenohumeral joint degenerative change.  IMPRESSION: No acute osseous abnormality of the left clavicle.  If there is strong clinical suspicion for sternoclavicular osteomyelitis or other radiographically occult finding, consider CT of the upper chest with contrast for further evaluation (contrast should  be injected in the contralateral right side).   Original Report Authenticated By: Christiana Pellant, M.D.   Dg Clavicle Right  12/26/2012   *RADIOLOGY REPORT*  Clinical Data: Pain and swelling of the sternoclavicular joint  RIGHT CLAVICLE - 2+ VIEWS  Comparison: The chest radiograph 10/22/2012  Findings: No clavicular fracture identified.  Mild right AC joint degenerative change.  Right lung apex is clear.  IMPRESSION: No acute abnormality.   Original Report Authenticated By: Christiana Pellant, M.D.   Dg Hand Complete Right  12/26/2012   *RADIOLOGY REPORT*  Clinical Data: Right hand pain and swelling and  RIGHT HAND - COMPLETE 3+ VIEW  Comparison: None.  Findings: There is no evidence of fracture or dislocation. Significant degenerative change is noted at the first carpometacarpal joint, with mild surrounding bone formation and joint space loss.  Remaining visualized joint spaces are grossly preserved.  The  carpal rows are grossly intact, and demonstrate normal alignment.  No significant soft tissue abnormalities are characterized on radiograph.  IMPRESSION:  1.  No evidence of fracture or dislocation. 2.  Significant osteoarthritis at the first carpometacarpal joint.   Original Report Authenticated By: Tonia Ghent, M.D.     1. Cellulitis of multiple sites of right hand and fingers   2. Hypokalemia   3. Hyponatremia   4. Unspecified essential hypertension   5. Cellulitis of hand, right   6. Cellulitis of wrist   7. Cellulitis of fifth finger, right    Disposition per Dr Lucy Chris, MD, FACEP    MDM    I personally performed the services described in this documentation, which was scribed in my presence. The recorded information has been reviewed and considered.  Devoria Albe, MD, Armando Gang    Ward Givens, MD 12/26/12 [phone]

## 2012-12-26 NOTE — ED Notes (Signed)
Patient with pain w/out injury to R wrist and 5th finger.  Swelling on dorsum of wrist and 5th finger.

## 2012-12-26 NOTE — H&P (Signed)
[facility] History and Physical  [name]:[id number] DOB: 11-18-1940 DOA: 12/26/2012   PCP: [name], MD  Specialists: She is followed by Dr. Fransico Him, endocrinologist  Chief Complaint: Pain in the right hand and the clavicular bones  HPI: Caroline Nichols is a 72 y.o. female with a past medical history of hypothyroidism, acid reflux disease, hypertension, who was in her usual state of health till Friday morning, when she noticed that she had pain and swelling in the right fifth finger. The swelling and the pain started migrating down to her hand and into her wrist. She also noticed pain in her collar bones. The symptoms started getting worse over the course of the day yesterday and today and so, she decided to come in to the hospital. She's had chills and she thinks she may have had a fever, but she's not sure. She's unable to move that right hand and the right wrist. She's unable to make a fist. Pain is 10/10 in intensity. The hand was warm to touch. She denies any injuries or insect bites, but cannot be 100% sure. She feels pain in her muscles and bones. She has been nauseous without any vomiting. She is very concerned about her symptoms.  Home Medications: Prior to Admission medications   Medication Sig Start Date End Date Taking? Authorizing Provider  ALPRAZolam Prudy Feeler) 0.5 MG tablet Take 0.5 mg by mouth 3 (three) times daily as needed for anxiety.    Yes Historical Provider, MD  aspirin EC 81 MG tablet Take 81 mg by mouth daily.   Yes Historical Provider, MD  Calcium-Vitamin D (SUPER CALCIUM/D) 600-125 MG-UNIT TABS Take by mouth. Taking 2 daily   Yes Historical Provider, MD  desmopressin (DDAVP) 0.2 MG tablet Take 0.2 mg by mouth at bedtime.   Yes Historical Provider, MD  levothyroxine (SYNTHROID, LEVOTHROID) 112 MCG tablet Take 112 mcg by mouth daily before breakfast.   Yes Historical Provider, MD  Omega-3 Fatty Acids (FISH OIL) 1000 MG CAPS Take by mouth daily.   Yes  Historical Provider, MD  potassium chloride (K-DUR,KLOR-CON) 10 MEQ tablet Take 40 mEq by mouth 2 (two) times daily.   Yes Historical Provider, MD  propranolol (INDERAL) 80 MG tablet Take 80 mg by mouth daily.   Yes Historical Provider, MD    Allergies:  Allergies  Allergen Reactions  . Codeine     REACTION: NAUSEA  . Tetanus Toxoid     REACTION: ARM SWELLING,REDNESS    Past Medical History: Past Medical History  Diagnosis Date  . Hypertension   . Thyroid disease     hyper  . IBS (irritable bowel syndrome)   . Herniated disc   . Pancreatic cyst     Past Surgical History  Procedure Laterality Date  . Tubal ligation    . Cervical spine surgery    . Hammer toe surgery    . Other surgical history      partial hysterectomy  . Hernia repair      Social History:  reports that she has been smoking Cigarettes.  She has been smoking about 0.50 packs per day. She does not have any smokeless tobacco history on file. She reports that she does not drink alcohol or use illicit drugs.  Living Situation: She lives by herself Activity Level: Usually independent with daily activities. Occasionally uses a cane   Family History: There is history of asthma, heart attack, diabetes, and renal failure in the family  Review of Systems - History  obtained from the patient General ROS: positive for  - chills Psychological ROS: positive for - anxiety Ophthalmic ROS: negative ENT ROS: negative Allergy and Immunology ROS: negative Hematological and Lymphatic ROS: negative Endocrine ROS: negative Respiratory ROS: no cough, shortness of breath, or wheezing Cardiovascular ROS: pain over clavicular bones Gastrointestinal ROS: no abdominal pain, change in bowel habits, or black or bloody stools Genito-Urinary ROS: no dysuria, trouble voiding, or hematuria Musculoskeletal ROS: as in hpi Neurological ROS: no TIA or stroke symptoms Dermatological ROS: negative  Physical Examination  Filed Vitals:    12/26/12 1836 12/26/12 2259  BP: 123/76 165/91  Pulse: 49 65  Temp: 99.3 F (37.4 C) 97.8 F (36.6 C)  TempSrc: Oral Oral  Resp: 18 20  SpO2: 99% 99%    General appearance: alert, cooperative, appears stated age and no distress Head: Normocephalic, without obvious abnormality, atraumatic Eyes: conjunctivae/corneas clear. PERRL, EOM's intact.  Throat: lips, mucosa, and tongue normal; teeth and gums normal Neck: no adenopathy, no carotid bruit, no JVD, supple, symmetrical, trachea midline and thyroid not enlarged, symmetric, no tenderness/mass/nodules Resp: clear to auscultation bilaterally Cardio: regular rate and rhythm, S1, S2 normal, no murmur, click, rub or gallop GI: soft, non-tender; bowel sounds normal; no masses,  no organomegaly Extremities: Right hand is swollen. Inflammation and swelling of the right fifth digit is also noted. Erythema is noted involving the fifth digit as well as the hand dorsally as well as the wrist joint. Range of motion is severely restricted. Radial pulses are well palpable. Pulses: 2+ and symmetric Skin: Erythema over right wrist and right 5th finger. Lymph nodes: Cervical, supraclavicular, and axillary nodes normal. Neurologic: Alert and oriented x 3. No focal deficits.  Laboratory Data: Results for orders placed during the hospital encounter of 12/26/12 (from the past 48 hour(s))  CULTURE, BLOOD (ROUTINE X 2)     Status: None   Collection Time    12/26/12  7:39 PM      Result Value Range   Specimen Description LEFT ANTECUBITAL     Special Requests BOTTLES DRAWN AEROBIC AND ANAEROBIC 4CC     Culture PENDING     Report Status PENDING    CBC WITH DIFFERENTIAL     Status: Abnormal   Collection Time    12/26/12  7:45 PM      Result Value Range   WBC 5.1  4.0 - 10.5 K/uL   RBC 3.94  3.87 - 5.11 MIL/uL   Hemoglobin 12.2  12.0 - 15.0 g/dL   HCT 16.1 (*) 09.6 - 04.5 %   MCV 90.6  78.0 - 100.0 fL   MCH 31.0  26.0 - 34.0 pg   MCHC 34.2  30.0 -  36.0 g/dL   RDW 40.9  81.1 - 91.4 %   Platelets 234  150 - 400 K/uL   Neutrophils Relative % 28 (*) 43 - 77 %   Lymphocytes Relative 49 (*) 12 - 46 %   Monocytes Relative 23 (*) 3 - 12 %   Eosinophils Relative 0  0 - 5 %   Basophils Relative 0  0 - 1 %   Neutro Abs 1.4 (*) 1.7 - 7.7 K/uL   Lymphs Abs 2.5  0.7 - 4.0 K/uL   Monocytes Absolute 1.2 (*) 0.1 - 1.0 K/uL   Eosinophils Absolute 0.0  0.0 - 0.7 K/uL   Basophils Absolute 0.0  0.0 - 0.1 K/uL  COMPREHENSIVE METABOLIC PANEL     Status: Abnormal   Collection Time  12/26/12  7:45 PM      Result Value Range   Sodium 133 (*) 135 - 145 mEq/L   Potassium 3.3 (*) 3.5 - 5.1 mEq/L   Chloride 94 (*) 96 - 112 mEq/L   CO2 28  19 - 32 mEq/L   Glucose, Bld 111 (*) 70 - 99 mg/dL   BUN 10  6 - 23 mg/dL   Creatinine, Ser 4.09  0.50 - 1.10 mg/dL   Calcium 81.1  8.4 - 91.4 mg/dL   Total Protein 8.2  6.0 - 8.3 g/dL   Albumin 3.5  3.5 - 5.2 g/dL   AST 18  0 - 37 U/L   ALT 14  0 - 35 U/L   Alkaline Phosphatase 88  39 - 117 U/L   Total Bilirubin 0.5  0.3 - 1.2 mg/dL   GFR calc non Af Amer >90  >90 mL/min   GFR calc Af Amer >90  >90 mL/min   Comment:            The eGFR has been calculated     using the CKD EPI equation.     This calculation has not been     validated in all clinical     situations.     eGFR's persistently     <90 mL/min signify     possible Chronic Kidney Disease.  SEDIMENTATION RATE     Status: Abnormal   Collection Time    12/26/12  7:45 PM      Result Value Range   Sed Rate 77 (*) 0 - 22 mm/hr  CULTURE, BLOOD (ROUTINE X 2)     Status: None   Collection Time    12/26/12  7:48 PM      Result Value Range   Specimen Description BLOOD LEFT HAND     Special Requests BOTTLES DRAWN AEROBIC AND ANAEROBIC 4CC     Culture PENDING     Report Status PENDING      Radiology Reports: Dg Clavicle Left  12/26/2012   *RADIOLOGY REPORT*  Clinical Data: Painful swollen sternoclavicular joint and shoulder pain  LEFT CLAVICLE -  2+ VIEWS  Comparison: Chest radiograph 10/22/2012  Findings: There is mild degenerative change at the acromioclavicular joint.  Artifact overlies the distal clavicle on one view.  There is no clavicular destruction or fracture.  Mild left glenohumeral joint degenerative change.  IMPRESSION: No acute osseous abnormality of the left clavicle.  If there is strong clinical suspicion for sternoclavicular osteomyelitis or other radiographically occult finding, consider CT of the upper chest with contrast for further evaluation (contrast should be injected in the contralateral right side).   Original Report Authenticated By: Christiana Pellant, M.D.   Dg Clavicle Right  12/26/2012   *RADIOLOGY REPORT*  Clinical Data: Pain and swelling of the sternoclavicular joint  RIGHT CLAVICLE - 2+ VIEWS  Comparison: The chest radiograph 10/22/2012  Findings: No clavicular fracture identified.  Mild right AC joint degenerative change.  Right lung apex is clear.  IMPRESSION: No acute abnormality.   Original Report Authenticated By: Christiana Pellant, M.D.   Dg Hand Complete Right  12/26/2012   *RADIOLOGY REPORT*  Clinical Data: Right hand pain and swelling and  RIGHT HAND - COMPLETE 3+ VIEW  Comparison: None.  Findings: There is no evidence of fracture or dislocation. Significant degenerative change is noted at the first carpometacarpal joint, with mild surrounding bone formation and joint space loss.  Remaining visualized joint spaces are grossly preserved.  The carpal  rows are grossly intact, and demonstrate normal alignment.  No significant soft tissue abnormalities are characterized on radiograph.  IMPRESSION:  1.  No evidence of fracture or dislocation. 2.  Significant osteoarthritis at the first carpometacarpal joint.   Original Report Authenticated By: Tonia Ghent, M.D.    Problem List  Principal Problem:   Cellulitis of multiple sites of right hand and fingers Active Problems:   Other postablative hypothyroidism    SMOKER   HYPERTENSION   Pain in clavicular joint   Hypokalemia   Hyponatremia   Assessment: This is a 72 year old, African American female, who presents with right hand pain, and clavicle pain. White count is normal. However, she does have a low-grade fever. Concern is about tenosynovitis of the hand. Differential diagnoses include autoimmune process.  Plan: #1 right hand pain with possible cellulitis/tenosynovitis: I have discussed with the hand surgeon, Dr. Izora Ribas and the he will evaluate the patient when the patient arrives at [facility]. [facility]. She will be started on vancomycin. Blood cultures have been obtained. We will check ANA and rheumatoid factor level. Uric acid level will also be checked.  #2 inflammation of the sternoclavicular joints: Etiology is unclear. X-ray did not show anything obvious. However, we will proceed with CT scan of the chest to better delineate this area. ESR is noted to be elevated.  #2 hyponatremia and hyperkalemia: This will be repleted intravenously.  #3 history of hypothyroidism: Check TSH and free T4. Continue with l-thyroxine.  #5 recent diagnosis of diabetes insipidus: Continue with DDAVP.  #6 tobacco abuse: Nicotine patch will be prescribed.  #7 history of hypertension: Continue with antihypertensive regimen  DVT Prophylaxis: SCDs Code Status: He is a full code Family Communication: Discussed with the patient and her daughter  Disposition Plan: Unclear for now   Further management decisions will depend on results of further testing and patient's response to treatment.  [facility]  [facility] Pager [phone]  If 7PM-7AM, please contact night-coverage [email or web address] Password [facility]  12/26/2012, 11:05 PM

## 2012-12-27 ENCOUNTER — Inpatient Hospital Stay (HOSPITAL_COMMUNITY): Payer: Medicare Other

## 2012-12-27 ENCOUNTER — Encounter (HOSPITAL_COMMUNITY): Payer: Self-pay | Admitting: *Deleted

## 2012-12-27 ENCOUNTER — Other Ambulatory Visit (HOSPITAL_COMMUNITY): Payer: Medicare Other

## 2012-12-27 DIAGNOSIS — M129 Arthropathy, unspecified: Secondary | ICD-10-CM

## 2012-12-27 LAB — URINALYSIS, ROUTINE W REFLEX MICROSCOPIC
Bilirubin Urine: NEGATIVE
Glucose, UA: NEGATIVE mg/dL
Specific Gravity, Urine: 1.011 (ref 1.005–1.030)
Urobilinogen, UA: 0.2 mg/dL (ref 0.0–1.0)
pH: 7 (ref 5.0–8.0)

## 2012-12-27 LAB — CBC
HCT: 32.9 % — ABNORMAL LOW (ref 36.0–46.0)
MCV: 89.4 fL (ref 78.0–100.0)
RDW: 15.3 % (ref 11.5–15.5)
WBC: 4.8 10*3/uL (ref 4.0–10.5)

## 2012-12-27 LAB — COMPREHENSIVE METABOLIC PANEL
ALT: 12 U/L (ref 0–35)
AST: 16 U/L (ref 0–37)
Albumin: 3.1 g/dL — ABNORMAL LOW (ref 3.5–5.2)
CO2: 26 mEq/L (ref 19–32)
Calcium: 9.2 mg/dL (ref 8.4–10.5)
Creatinine, Ser: 0.47 mg/dL — ABNORMAL LOW (ref 0.50–1.10)
GFR calc non Af Amer: >90 mL/min (ref >90)
Sodium: 138 mEq/L (ref 135–145)
Total Protein: 7.3 g/dL (ref 6.0–8.3)

## 2012-12-27 LAB — URINE MICROSCOPIC-ADD ON

## 2012-12-27 LAB — GLUCOSE, CAPILLARY: Glucose-Capillary: 98 mg/dL (ref 70–99)

## 2012-12-27 LAB — HEMOGLOBIN A1C: Hgb A1c MFr Bld: 5.2 % (ref <5.7)

## 2012-12-27 LAB — RHEUMATOID FACTOR: Rhuematoid fact SerPl-aCnc: 86 IU/mL — ABNORMAL HIGH (ref <=14)

## 2012-12-27 MED ORDER — ONDANSETRON HCL 4 MG/2ML IJ SOLN
4.0000 mg | Freq: Four times a day (QID) | INTRAMUSCULAR | Status: DC | PRN
  Administered 2012-12-27 – 2013-01-03 (×3): 4 mg via INTRAVENOUS
  Filled 2012-12-27 (×3): qty 2

## 2012-12-27 MED ORDER — PROPRANOLOL HCL 80 MG PO TABS: 80.0000 mg | ORAL_TABLET | Freq: Every day | ORAL | Status: DC

## 2012-12-27 MED ORDER — POTASSIUM CHLORIDE IN NACL 20-0.9 MEQ/L-% IV SOLN
INTRAVENOUS | Status: DC
  Administered 2012-12-27: 100 mL/h via INTRAVENOUS
  Filled 2012-12-27 (×3): qty 1000

## 2012-12-27 MED ORDER — ENOXAPARIN SODIUM 40 MG/0.4ML ~~LOC~~ SOLN
40.0000 mg | SUBCUTANEOUS | Status: DC
  Filled 2012-12-27: qty 0.4

## 2012-12-27 MED ORDER — ALPRAZOLAM 0.5 MG PO TABS
0.5000 mg | ORAL_TABLET | Freq: Three times a day (TID) | ORAL | Status: DC | PRN
  Administered 2012-12-27 – 2013-01-04 (×19): 0.5 mg via ORAL
  Filled 2012-12-27 (×20): qty 1

## 2012-12-27 MED ORDER — ENOXAPARIN SODIUM 30 MG/0.3ML ~~LOC~~ SOLN
30.0000 mg | SUBCUTANEOUS | Status: DC
Start: 2012-12-27 — End: 2013-01-02
  Administered 2012-12-27 – 2013-01-01 (×6): 30 mg via SUBCUTANEOUS
  Filled 2012-12-27 (×7): qty 0.3

## 2012-12-27 MED ORDER — DESMOPRESSIN ACETATE 0.2 MG PO TABS
0.2000 mg | ORAL_TABLET | Freq: Every day | ORAL | Status: DC
  Administered 2012-12-27 – 2013-01-03 (×8): 0.2 mg via ORAL
  Filled 2012-12-27 (×12): qty 1

## 2012-12-27 MED ORDER — NAPROXEN 500 MG PO TABS
500.0000 mg | ORAL_TABLET | Freq: Three times a day (TID) | ORAL | Status: DC
  Administered 2012-12-27 – 2012-12-29 (×5): 500 mg via ORAL
  Filled 2012-12-27 (×9): qty 1

## 2012-12-27 MED ORDER — NICOTINE 14 MG/24HR TD PT24
14.0000 mg | MEDICATED_PATCH | Freq: Every day | TRANSDERMAL | Status: DC
  Administered 2012-12-27 – 2013-01-04 (×9): 14 mg via TRANSDERMAL
  Filled 2012-12-27 (×10): qty 1

## 2012-12-27 MED ORDER — PIPERACILLIN-TAZOBACTAM 3.375 G IVPB
3.3750 g | Freq: Three times a day (TID) | INTRAVENOUS | Status: DC
  Administered 2012-12-27 – 2012-12-29 (×5): 3.375 g via INTRAVENOUS
  Filled 2012-12-27 (×8): qty 50

## 2012-12-27 MED ORDER — POTASSIUM CHLORIDE CRYS ER 20 MEQ PO TBCR
40.0000 meq | EXTENDED_RELEASE_TABLET | Freq: Once | ORAL | Status: AC
  Administered 2012-12-27: 40 meq via ORAL
  Filled 2012-12-27: qty 2

## 2012-12-27 MED ORDER — LEVOTHYROXINE SODIUM 112 MCG PO TABS
112.0000 ug | ORAL_TABLET | Freq: Every day | ORAL | Status: DC
  Administered 2012-12-27 – 2012-12-31 (×5): 112 ug via ORAL
  Filled 2012-12-27 (×7): qty 1

## 2012-12-27 MED ORDER — HYDRALAZINE HCL 20 MG/ML IJ SOLN
10.0000 mg | Freq: Four times a day (QID) | INTRAMUSCULAR | Status: DC | PRN
  Filled 2012-12-27: qty 1

## 2012-12-27 MED ORDER — ACETAMINOPHEN 650 MG RE SUPP: 650.0000 mg | Freq: Four times a day (QID) | RECTAL | Status: DC | PRN

## 2012-12-27 MED ORDER — VANCOMYCIN HCL IN DEXTROSE 750-5 MG/150ML-% IV SOLN
750.0000 mg | INTRAVENOUS | Status: DC
  Administered 2012-12-27 – 2012-12-29 (×3): 750 mg via INTRAVENOUS
  Filled 2012-12-27 (×3): qty 150

## 2012-12-27 MED ORDER — OXYCODONE HCL 5 MG PO TABS
5.0000 mg | ORAL_TABLET | ORAL | Status: DC | PRN
  Administered 2012-12-30 – 2013-01-04 (×10): 5 mg via ORAL
  Filled 2012-12-27 (×10): qty 1

## 2012-12-27 MED ORDER — DOCUSATE SODIUM 100 MG PO CAPS
100.0000 mg | ORAL_CAPSULE | Freq: Two times a day (BID) | ORAL | Status: DC
  Administered 2012-12-27 (×2): 100 mg via ORAL
  Filled 2012-12-27 (×4): qty 1

## 2012-12-27 MED ORDER — PROPRANOLOL HCL ER 80 MG PO CP24
80.0000 mg | ORAL_CAPSULE | Freq: Every day | ORAL | Status: DC
  Administered 2012-12-27 – 2012-12-31 (×3): 80 mg via ORAL
  Filled 2012-12-27 (×6): qty 1

## 2012-12-27 MED ORDER — ACETAMINOPHEN 325 MG PO TABS
650.0000 mg | ORAL_TABLET | Freq: Four times a day (QID) | ORAL | Status: DC | PRN
  Administered 2012-12-31: 650 mg via ORAL
  Filled 2012-12-27: qty 2

## 2012-12-27 MED ORDER — IOHEXOL 300 MG/ML  SOLN
80.0000 mL | Freq: Once | INTRAMUSCULAR | Status: AC | PRN
  Administered 2012-12-27: 80 mL via INTRAVENOUS

## 2012-12-27 MED ORDER — ONDANSETRON HCL 4 MG PO TABS
4.0000 mg | ORAL_TABLET | Freq: Four times a day (QID) | ORAL | Status: DC | PRN
  Administered 2013-01-01: 4 mg via ORAL
  Filled 2012-12-27: qty 1

## 2012-12-27 MED ORDER — MORPHINE SULFATE 2 MG/ML IJ SOLN
1.0000 mg | INTRAMUSCULAR | Status: DC | PRN
  Administered 2012-12-27 (×2): 1 mg via INTRAVENOUS
  Filled 2012-12-27 (×2): qty 1

## 2012-12-27 MED ORDER — PANTOPRAZOLE SODIUM 40 MG PO TBEC
40.0000 mg | DELAYED_RELEASE_TABLET | Freq: Every day | ORAL | Status: DC
  Administered 2012-12-27 – 2012-12-29 (×3): 40 mg via ORAL
  Filled 2012-12-27 (×3): qty 1

## 2012-12-27 MED ORDER — ASPIRIN EC 81 MG PO TBEC
81.0000 mg | DELAYED_RELEASE_TABLET | Freq: Every day | ORAL | Status: DC
  Administered 2012-12-27 – 2013-01-04 (×9): 81 mg via ORAL
  Filled 2012-12-27 (×10): qty 1

## 2012-12-27 NOTE — Progress Notes (Signed)
At 0815, patient complaining of nausea. Small amount of emesis with slight greenish stringy sputum. Zofran given IV. IV hep locked OK with Dr. Jerral Ralph. Patient up to bedside commode with standby assist. Right hand swollen, tender. Brace on, secure. Patient remains NPO. Patient to CT chest via bed. To monitor comfort. Sherlyn Lees

## 2012-12-27 NOTE — Consult Note (Signed)
Reason for Consult:swelling in R finger, hand Referring Physician: Internal Medicine Cc:  My hand and finger hurt Caroline Nichols is an 72 y.o. right handed female.  HPI: pt was awakened Friday am with pain in RSF and R wrist, denies trauma, states it became worse and then presented to ER Sat.  States has had multiple episodes with joint swelling including knees which have had to be drained, both hands and occasionally toes.  Currently swelling, pain localized to [facility] and wrist  Past Medical History  Diagnosis Date  . Hypertension   . Thyroid disease     hyper  . IBS (irritable bowel syndrome)   . Herniated disc   . Pancreatic cyst   . Arthritis   . Hyperthyroidism     Past Surgical History  Procedure Laterality Date  . Tubal ligation    . Cervical spine surgery    . Hammer toe surgery    . Other surgical history      partial hysterectomy  . Hernia repair    . Abdominal hysterectomy      History reviewed. No pertinent family history.  Social History:  reports that she has been smoking Cigarettes.  She has been smoking about 0.50 packs per day. She does not have any smokeless tobacco history on file. She reports that she does not drink alcohol or use illicit drugs.  Allergies:  Allergies  Allergen Reactions  . Codeine     REACTION: NAUSEA  . Tetanus Toxoid     REACTION: ARM SWELLING,REDNESS    Medications: I have reviewed the patient's current medications.  Results for orders placed during the hospital encounter of 12/26/12 (from the past 48 hour(s))  CULTURE, BLOOD (ROUTINE X 2)     Status: None   Collection Time    12/26/12  7:39 PM      Result Value Range   Specimen Description LEFT ANTECUBITAL     Special Requests BOTTLES DRAWN AEROBIC AND ANAEROBIC 4CC     Culture NO GROWTH 1 DAY     Report Status PENDING    CBC WITH DIFFERENTIAL     Status: Abnormal   Collection Time    12/26/12  7:45 PM      Result Value Range   WBC 5.1  4.0 - 10.5 K/uL   RBC 3.94  3.87 -  5.11 MIL/uL   Hemoglobin 12.2  12.0 - 15.0 g/dL   HCT 16.1 (*) 09.6 - 04.5 %   MCV 90.6  78.0 - 100.0 fL   MCH 31.0  26.0 - 34.0 pg   MCHC 34.2  30.0 - 36.0 g/dL   RDW 40.9  81.1 - 91.4 %   Platelets 234  150 - 400 K/uL   Neutrophils Relative % 28 (*) 43 - 77 %   Lymphocytes Relative 49 (*) 12 - 46 %   Monocytes Relative 23 (*) 3 - 12 %   Eosinophils Relative 0  0 - 5 %   Basophils Relative 0  0 - 1 %   Neutro Abs 1.4 (*) 1.7 - 7.7 K/uL   Lymphs Abs 2.5  0.7 - 4.0 K/uL   Monocytes Absolute 1.2 (*) 0.1 - 1.0 K/uL   Eosinophils Absolute 0.0  0.0 - 0.7 K/uL   Basophils Absolute 0.0  0.0 - 0.1 K/uL  COMPREHENSIVE METABOLIC PANEL     Status: Abnormal   Collection Time    12/26/12  7:45 PM      Result Value Range  Sodium 133 (*) 135 - 145 mEq/L   Potassium 3.3 (*) 3.5 - 5.1 mEq/L   Chloride 94 (*) 96 - 112 mEq/L   CO2 28  19 - 32 mEq/L   Glucose, Bld 111 (*) 70 - 99 mg/dL   BUN 10  6 - 23 mg/dL   Creatinine, Ser 1.61  0.50 - 1.10 mg/dL   Calcium 09.6  8.4 - 04.5 mg/dL   Total Protein 8.2  6.0 - 8.3 g/dL   Albumin 3.5  3.5 - 5.2 g/dL   AST 18  0 - 37 U/L   ALT 14  0 - 35 U/L   Alkaline Phosphatase 88  39 - 117 U/L   Total Bilirubin 0.5  0.3 - 1.2 mg/dL   GFR calc non Af Amer >90  >90 mL/min   GFR calc Af Amer >90  >90 mL/min   Comment:            The eGFR has been calculated     using the CKD EPI equation.     This calculation has not been     validated in all clinical     situations.     eGFR's persistently     <90 mL/min signify     possible Chronic Kidney Disease.  SEDIMENTATION RATE     Status: Abnormal   Collection Time    12/26/12  7:45 PM      Result Value Range   Sed Rate 77 (*) 0 - 22 mm/hr  CULTURE, BLOOD (ROUTINE X 2)     Status: None   Collection Time    12/26/12  7:48 PM      Result Value Range   Specimen Description BLOOD LEFT HAND     Special Requests BOTTLES DRAWN AEROBIC AND ANAEROBIC 4CC     Culture NO GROWTH 1 DAY     Report Status PENDING     URIC ACID     Status: None   Collection Time    12/27/12  2:14 AM      Result Value Range   Uric Acid, Serum 2.7  2.4 - 7.0 mg/dL  CBC     Status: Abnormal   Collection Time    12/27/12  2:14 AM      Result Value Range   WBC 4.8  4.0 - 10.5 K/uL   RBC 3.68 (*) 3.87 - 5.11 MIL/uL   Hemoglobin 11.1 (*) 12.0 - 15.0 g/dL   HCT 40.9 (*) 81.1 - 91.4 %   MCV 89.4  78.0 - 100.0 fL   MCH 30.2  26.0 - 34.0 pg   MCHC 33.7  30.0 - 36.0 g/dL   RDW 78.2  95.6 - 21.3 %   Platelets 201  150 - 400 K/uL  COMPREHENSIVE METABOLIC PANEL     Status: Abnormal   Collection Time    12/27/12  2:14 AM      Result Value Range   Sodium 138  135 - 145 mEq/L   Potassium 3.3 (*) 3.5 - 5.1 mEq/L   Chloride 102  96 - 112 mEq/L   CO2 26  19 - 32 mEq/L   Glucose, Bld 120 (*) 70 - 99 mg/dL   BUN 7  6 - 23 mg/dL   Creatinine, Ser 0.86 (*) 0.50 - 1.10 mg/dL   Calcium 9.2  8.4 - 57.8 mg/dL   Total Protein 7.3  6.0 - 8.3 g/dL   Albumin 3.1 (*) 3.5 - 5.2 g/dL   AST  16  0 - 37 U/L   ALT 12  0 - 35 U/L   Alkaline Phosphatase 77  39 - 117 U/L   Total Bilirubin 0.6  0.3 - 1.2 mg/dL   GFR calc non Af Amer >90  >90 mL/min   GFR calc Af Amer >90  >90 mL/min   Comment:            The eGFR has been calculated     using the CKD EPI equation.     This calculation has not been     validated in all clinical     situations.     eGFR's persistently     <90 mL/min signify     possible Chronic Kidney Disease.  URINALYSIS, ROUTINE W REFLEX MICROSCOPIC     Status: Abnormal   Collection Time    12/27/12  5:15 AM      Result Value Range   Color, Urine YELLOW  YELLOW   APPearance CLEAR  CLEAR   Specific Gravity, Urine 1.011  1.005 - 1.030   pH 7.0  5.0 - 8.0   Glucose, UA NEGATIVE  NEGATIVE mg/dL   Hgb urine dipstick MODERATE (*) NEGATIVE   Bilirubin Urine NEGATIVE  NEGATIVE   Ketones, ur NEGATIVE  NEGATIVE mg/dL   Protein, ur NEGATIVE  NEGATIVE mg/dL   Urobilinogen, UA 0.2  0.0 - 1.0 mg/dL   Nitrite NEGATIVE   NEGATIVE   Leukocytes, UA NEGATIVE  NEGATIVE  URINE MICROSCOPIC-ADD ON     Status: Abnormal   Collection Time    12/27/12  5:15 AM      Result Value Range   Squamous Epithelial / LPF RARE  RARE   WBC, UA 0-2  <3 WBC/hpf   RBC / HPF 11-20  <3 RBC/hpf   Bacteria, UA FEW (*) RARE   Urine-Other MUCOUS PRESENT    GLUCOSE, CAPILLARY     Status: None   Collection Time    12/27/12  7:31 AM      Result Value Range   Glucose-Capillary 98  70 - 99 mg/dL   Comment 1 Documented in Chart     Comment 2 Notify RN      Dg Clavicle Left  12/26/2012   *RADIOLOGY REPORT*  Clinical Data: Painful swollen sternoclavicular joint and shoulder pain  LEFT CLAVICLE - 2+ VIEWS  Comparison: Chest radiograph 10/22/2012  Findings: There is mild degenerative change at the acromioclavicular joint.  Artifact overlies the distal clavicle on one view.  There is no clavicular destruction or fracture.  Mild left glenohumeral joint degenerative change.  IMPRESSION: No acute osseous abnormality of the left clavicle.  If there is strong clinical suspicion for sternoclavicular osteomyelitis or other radiographically occult finding, consider CT of the upper chest with contrast for further evaluation (contrast should be injected in the contralateral right side).   Original Report Authenticated By: Christiana Pellant, M.D.   Dg Clavicle Right  12/26/2012   *RADIOLOGY REPORT*  Clinical Data: Pain and swelling of the sternoclavicular joint  RIGHT CLAVICLE - 2+ VIEWS  Comparison: The chest radiograph 10/22/2012  Findings: No clavicular fracture identified.  Mild right AC joint degenerative change.  Right lung apex is clear.  IMPRESSION: No acute abnormality.   Original Report Authenticated By: Christiana Pellant, M.D.   Dg Hand Complete Right  12/26/2012   *RADIOLOGY REPORT*  Clinical Data: Right hand pain and swelling and  RIGHT HAND - COMPLETE 3+ VIEW  Comparison: None.  Findings: There is no evidence  of fracture or dislocation. Significant  degenerative change is noted at the first carpometacarpal joint, with mild surrounding bone formation and joint space loss.  Remaining visualized joint spaces are grossly preserved.  The carpal rows are grossly intact, and demonstrate normal alignment.  No significant soft tissue abnormalities are characterized on radiograph.  IMPRESSION:  1.  No evidence of fracture or dislocation. 2.  Significant osteoarthritis at the first carpometacarpal joint.   Original Report Authenticated By: Tonia Ghent, M.D.    Pertinent items are noted in HPI. Temp:  [97.8 F (36.6 C)-99.3 F (37.4 C)] 97.8 F (36.6 C) (05/25 0556) Pulse Rate:  [49-65] 57 (05/25 0556) Resp:  [18-20] 18 (05/25 0556) BP: (123-180)/(70-91) 163/70 mmHg (05/25 0815) SpO2:  [99 %-100 %] 99 % (05/25 0556) Weight:  [47.219 kg (104 lb 1.6 oz)] 47.219 kg (104 lb 1.6 oz) (05/25 0029) General appearance: alert and cooperative Resp: clear to auscultation bilaterally Cardio: regular rate and rhythm GI: soft, non-tender; bowel sounds normal; no masses,  no organomegaly Bilateral Sterno clavicular joints swollen painful to palpation, RSF with arthritic deformity but erythematous and swollen mostly along prox phalanx/pipj, no lbvious abscess or site of fluctuance; R wrist tender diffusely radially and ulnarly with some erythema extending to distal forearm. able to move fingers but extreme flexion hurts. grossly n/v intact; LUE wnl with evidence of arthritis of fingers   Assessment/Plan: Inflammation of RSF, R wrist - ? Whether this is infectious based on history of other joint involvement in past, no recent infections, WBC   Plan: Would continue BS abx for now, Add anti-inflammatory ( ?steroid), cont to follow results of inflammatory arthropathy; pt may eat as no surgical intervention warranted at this point.  Aunica Dauphinee CHRISTOPHER 12/27/2012, 11:07 AM

## 2012-12-27 NOTE — Progress Notes (Signed)
12/27/2012 To add zosyn to vancomycin for R hand cellulits/tenosynotivis.  Wt 47 kg. Creat 0.47 Plan: zosyn 3.375 gm IV q8h, infuse each dose over 4 hours Got vanc 1 gm 4/24 at 2036, to start vanc 750 q24 tonight We will f/u daily Herby Abraham, Pharm.D. [phone] 12/27/2012 11:24 AM

## 2012-12-27 NOTE — Progress Notes (Signed)
PATIENT DETAILS Name: Caroline Nichols Age: 72 y.o. Sex: female Date of Birth: [birth year] Admit Date: 12/26/2012 Admitting Physician Osvaldo Shipper, MD [id number], Caryn Bee, MD  Subjective: Hand essentially unchanged  Assessment/Plan: Principal Problem:   Cellulitis of multiple sites of right hand and fingers -not sure if this is infectious or a Rheumatologic -will add Zosyn, and at the same time start on Naprosyn -if no response to this-may need steroids -await Hand eval  Active Problems: Hx of Arthritis -given hx of multiple joints involved-likely she does have a rheumatologic disorder -await ANA, RA Factor, add CCP -trial on Naprosyn, if no improvement will try steroids  HTN -uncontrolled -will stop IVF and see if this improves, suspect pain playing a role as well.If still no improvement will add a second agent -c/w Inderal -on prn Hydralazine as well  Hypothyroidism -c/w Levothyroxine -TSH pending    SMOKER -counseled extensively -on transdermal nicotine  Diabetic Insipidus -c/w DDAVP -monitor Na carefully    Hypokalemia -replete and recheck  Disposition: Remain inpatient  DVT Prophylaxis: Prophylactic Lovenox  Code Status: Full code   Family Communication None  Procedures:  None  CONSULTS:  orthopedic surgery   MEDICATIONS: Scheduled Meds: . aspirin EC  81 mg Oral Daily  . desmopressin  0.2 mg Oral QHS  . docusate sodium  100 mg Oral BID  . levothyroxine  112 mcg Oral QAC breakfast  . naproxen  500 mg Oral TID WC  . nicotine  14 mg Transdermal Daily  . propranolol ER  80 mg Oral Daily  . vancomycin  750 mg Intravenous Q24H   Continuous Infusions: . 0.9 % NaCl with KCl 20 mEq / L 100 mL/hr (12/27/12 0143)   PRN Meds:.acetaminophen, acetaminophen, ALPRAZolam, hydrALAZINE, morphine injection, ondansetron (ZOFRAN) IV, ondansetron, oxyCODONE  Antibiotics: Anti-infectives   Start     Dose/Rate Route Frequency Ordered Stop   12/27/12 2200   vancomycin (VANCOCIN) IVPB 750 mg/150 ml premix     750 mg 150 mL/hr over 60 Minutes Intravenous Every 24 hours 12/27/12 0113     12/26/12 1945  vancomycin (VANCOCIN) IVPB 1000 mg/200 mL premix     1,000 mg 200 mL/hr over 60 Minutes Intravenous  Once 12/26/12 1940 12/26/12 2208       PHYSICAL EXAM: Vital signs in last 24 hours: Filed Vitals:   12/27/12 0029 12/27/12 0556 12/27/12 0648 12/27/12 0815  BP: 180/72 179/76 170/80 163/70  Pulse: 61 57    Temp: 98.1 F (36.7 C) 97.8 F (36.6 C)    TempSrc:      Resp: 18 18    Height: 5\' 2"  (1.575 m)     Weight: 47.219 kg (104 lb 1.6 oz)     SpO2: 100% 99%      Weight change:  Filed Weights   12/27/12 0029  Weight: 47.219 kg (104 lb 1.6 oz)   Body mass index is 19.04 kg/(m^2).   Gen Exam: Awake and alert with clear speech.   Neck: Supple, No JVD.   Chest: B/L Clear.   CVS: S1 S2 Regular, no murmurs.  Abdomen: soft, BS +, non tender, non distended.  Extremities: no edema, lower extremities warm to touch.Right hand is swollen. Inflammation and swelling of the right fifth digit is also noted. Erythema is noted involving the fifth digit as well as the hand dorsally as well as the wrist joint. Range of motion is severely restricted. Radial pulses are well palpable. Neurologic: Non Focal.   Skin: No Rash.   Wounds:  N/A.    Intake/Output from previous day:  Intake/Output Summary (Last 24 hours) at 12/27/12 1120 Last data filed at 12/27/12 0557  Gross per 24 hour  Intake      0 ml  Output      0 ml  Net      0 ml     LAB RESULTS: CBC  Recent Labs Lab 12/26/12 1945 12/27/12 0214  WBC 5.1 4.8  HGB 12.2 11.1*  HCT 35.7* 32.9*  PLT 234 201  MCV 90.6 89.4  MCH 31.0 30.2  MCHC 34.2 33.7  RDW 15.2 15.3  LYMPHSABS 2.5  --   MONOABS 1.2*  --   EOSABS 0.0  --   BASOSABS 0.0  --     Chemistries   Recent Labs Lab 12/26/12 1945 12/27/12 0214  NA 133* 138  K 3.3* 3.3*  CL 94* 102  CO2 28 26  GLUCOSE 111* 120*   BUN 10 7  CREATININE 0.57 0.47*  CALCIUM 10.1 9.2    CBG:  Recent Labs Lab 12/27/12 0731  GLUCAP 98    GFR Estimated Creatinine Clearance: 48.1 ml/min (by C-G formula based on Cr of 0.47).  Coagulation profile No results found for this basename: INR, PROTIME,  in the last 168 hours  Cardiac Enzymes No results found for this basename: CK, CKMB, TROPONINI, MYOGLOBIN,  in the last 168 hours  No components found with this basename: POCBNP,  No results found for this basename: DDIMER,  in the last 72 hours No results found for this basename: HGBA1C,  in the last 72 hours No results found for this basename: CHOL, HDL, LDLCALC, TRIG, CHOLHDL, LDLDIRECT,  in the last 72 hours No results found for this basename: TSH, T4TOTAL, FREET3, T3FREE, THYROIDAB,  in the last 72 hours No results found for this basename: VITAMINB12, FOLATE, FERRITIN, TIBC, IRON, RETICCTPCT,  in the last 72 hours No results found for this basename: LIPASE, AMYLASE,  in the last 72 hours  Urine Studies No results found for this basename: UACOL, UAPR, USPG, UPH, UTP, UGL, UKET, UBIL, UHGB, UNIT, UROB, ULEU, UEPI, UWBC, URBC, UBAC, CAST, CRYS, UCOM, BILUA,  in the last 72 hours  MICROBIOLOGY: Recent Results (from the past 240 hour(s))  CULTURE, BLOOD (ROUTINE X 2)     Status: None   Collection Time    12/26/12  7:39 PM      Result Value Range Status   Specimen Description LEFT ANTECUBITAL   Final   Special Requests BOTTLES DRAWN AEROBIC AND ANAEROBIC 4CC   Final   Culture NO GROWTH 1 DAY   Final   Report Status PENDING   Incomplete  CULTURE, BLOOD (ROUTINE X 2)     Status: None   Collection Time    12/26/12  7:48 PM      Result Value Range Status   Specimen Description BLOOD LEFT HAND   Final   Special Requests BOTTLES DRAWN AEROBIC AND ANAEROBIC 4CC   Final   Culture NO GROWTH 1 DAY   Final   Report Status PENDING   Incomplete    RADIOLOGY STUDIES/RESULTS: Dg Clavicle Left  12/26/2012   *RADIOLOGY  REPORT*  Clinical Data: Painful swollen sternoclavicular joint and shoulder pain  LEFT CLAVICLE - 2+ VIEWS  Comparison: Chest radiograph 10/22/2012  Findings: There is mild degenerative change at the acromioclavicular joint.  Artifact overlies the distal clavicle on one view.  There is no clavicular destruction or fracture.  Mild left glenohumeral joint degenerative change.  IMPRESSION: No acute osseous abnormality of the left clavicle.  If there is strong clinical suspicion for sternoclavicular osteomyelitis or other radiographically occult finding, consider CT of the upper chest with contrast for further evaluation (contrast should be injected in the contralateral right side).   Original Report Authenticated By: Christiana Pellant, M.D.   Dg Clavicle Right  12/26/2012   *RADIOLOGY REPORT*  Clinical Data: Pain and swelling of the sternoclavicular joint  RIGHT CLAVICLE - 2+ VIEWS  Comparison: The chest radiograph 10/22/2012  Findings: No clavicular fracture identified.  Mild right AC joint degenerative change.  Right lung apex is clear.  IMPRESSION: No acute abnormality.   Original Report Authenticated By: Christiana Pellant, M.D.   Dg Hand Complete Right  12/26/2012   *RADIOLOGY REPORT*  Clinical Data: Right hand pain and swelling and  RIGHT HAND - COMPLETE 3+ VIEW  Comparison: None.  Findings: There is no evidence of fracture or dislocation. Significant degenerative change is noted at the first carpometacarpal joint, with mild surrounding bone formation and joint space loss.  Remaining visualized joint spaces are grossly preserved.  The carpal rows are grossly intact, and demonstrate normal alignment.  No significant soft tissue abnormalities are characterized on radiograph.  IMPRESSION:  1.  No evidence of fracture or dislocation. 2.  Significant osteoarthritis at the first carpometacarpal joint.   Original Report Authenticated By: Tonia Ghent, M.D.    Jeoffrey Massed, MD  [facility] Pager:[phone]  If 7PM-7AM, please contact night-coverage [email or web address] Password [email or web address] 12/27/2012, 11:20 AM   LOS: 1 day

## 2012-12-27 NOTE — Progress Notes (Signed)
ANTIBIOTIC CONSULT NOTE - INITIAL  Pharmacy Consult for vancomycin  Indication: cellulitis  Allergies  Allergen Reactions  . Codeine     REACTION: NAUSEA  . Tetanus Toxoid     REACTION: ARM SWELLING,REDNESS    Patient Measurements: Height: 5\' 2"  (157.5 cm) Weight: 104 lb 1.6 oz (47.219 kg) IBW/kg (Calculated) : 50.1 Adjusted Body Weight:   Vital Signs: Temp: 98.1 F (36.7 C) (05/25 0029) Temp src: Oral (05/24 2259) BP: 180/72 mmHg (05/25 0029) Pulse Rate: 61 (05/25 0029) Intake/Output from previous day:   Intake/Output from this shift:    Labs:  Recent Labs  12/26/12 1945  WBC 5.1  HGB 12.2  PLT 234  CREATININE 0.57   Estimated Creatinine Clearance: 48.1 ml/min (by C-G formula based on Cr of 0.57). No results found for this basename: VANCOTROUGH, Leodis Binet, VANCORANDOM, GENTTROUGH, GENTPEAK, GENTRANDOM, TOBRATROUGH, TOBRAPEAK, TOBRARND, AMIKACINPEAK, AMIKACINTROU, AMIKACIN,  in the last 72 hours   Microbiology: Recent Results (from the past 720 hour(s))  CULTURE, BLOOD (ROUTINE X 2)     Status: None   Collection Time    12/26/12  7:39 PM      Result Value Range Status   Specimen Description LEFT ANTECUBITAL   Final   Special Requests BOTTLES DRAWN AEROBIC AND ANAEROBIC 4CC   Final   Culture PENDING   Incomplete   Report Status PENDING   Incomplete  CULTURE, BLOOD (ROUTINE X 2)     Status: None   Collection Time    12/26/12  7:48 PM      Result Value Range Status   Specimen Description BLOOD LEFT HAND   Final   Special Requests BOTTLES DRAWN AEROBIC AND ANAEROBIC 4CC   Final   Culture PENDING   Incomplete   Report Status PENDING   Incomplete    Medical History: Past Medical History  Diagnosis Date  . Hypertension   . Thyroid disease     hyper  . IBS (irritable bowel syndrome)   . Herniated disc   . Pancreatic cyst     Medications:  Prescriptions prior to admission  Medication Sig Dispense Refill  . ALPRAZolam (XANAX) 0.5 MG tablet Take 0.5  mg by mouth 3 (three) times daily as needed for anxiety.       Marland Kitchen aspirin EC 81 MG tablet Take 81 mg by mouth daily.      . Calcium-Vitamin D (SUPER CALCIUM/D) 600-125 MG-UNIT TABS Take by mouth. Taking 2 daily      . desmopressin (DDAVP) 0.2 MG tablet Take 0.2 mg by mouth at bedtime.      Marland Kitchen levothyroxine (SYNTHROID, LEVOTHROID) 112 MCG tablet Take 112 mcg by mouth daily before breakfast.      . Omega-3 Fatty Acids (FISH OIL) 1000 MG CAPS Take by mouth daily.      . potassium chloride (K-DUR,KLOR-CON) 10 MEQ tablet Take 40 mEq by mouth 2 (two) times daily.      . propranolol ER (INDERAL LA) 80 MG 24 hr capsule Take 80 mg by mouth daily.       Assessment: 72 yo with cellulitis.   Goal of Therapy:  vanc 10-15 mcg/ml  Plan:  Vancomycin 750mg  q24h next dose at 2200 5/25 Trough prn   Janice Coffin 12/27/2012,1:10 AM

## 2012-12-28 ENCOUNTER — Ambulatory Visit (HOSPITAL_COMMUNITY): Payer: Medicare Other

## 2012-12-28 DIAGNOSIS — F411 Generalized anxiety disorder: Secondary | ICD-10-CM

## 2012-12-28 LAB — CBC
HCT: 33.3 % — ABNORMAL LOW (ref 36.0–46.0)
Hemoglobin: 11.1 g/dL — ABNORMAL LOW (ref 12.0–15.0)
MCHC: 33.3 g/dL (ref 30.0–36.0)
MCV: 89.8 fL (ref 78.0–100.0)
RDW: 15.3 % (ref 11.5–15.5)

## 2012-12-28 LAB — GLUCOSE, CAPILLARY

## 2012-12-28 LAB — BASIC METABOLIC PANEL
BUN: 15 mg/dL (ref 6–23)
Creatinine, Ser: 0.53 mg/dL (ref 0.50–1.10)
GFR calc non Af Amer: >90 mL/min (ref >90)
Glucose, Bld: 99 mg/dL (ref 70–99)
Potassium: 3.9 mEq/L (ref 3.5–5.1)

## 2012-12-28 MED ORDER — GI COCKTAIL ~~LOC~~
30.0000 mL | Freq: Three times a day (TID) | ORAL | Status: DC | PRN
  Administered 2012-12-28: 30 mL via ORAL
  Filled 2012-12-28: qty 30

## 2012-12-28 MED ORDER — POTASSIUM CHLORIDE CRYS ER 10 MEQ PO TBCR
10.0000 meq | EXTENDED_RELEASE_TABLET | Freq: Every day | ORAL | Status: DC
  Administered 2012-12-29: 10 meq via ORAL
  Filled 2012-12-28 (×3): qty 1

## 2012-12-28 MED ORDER — SENNA 8.6 MG PO TABS
2.0000 | ORAL_TABLET | Freq: Two times a day (BID) | ORAL | Status: DC
  Administered 2012-12-28 – 2013-01-04 (×12): 17.2 mg via ORAL
  Filled 2012-12-28 (×17): qty 2

## 2012-12-28 MED ORDER — BISACODYL 10 MG RE SUPP
10.0000 mg | Freq: Every day | RECTAL | Status: DC | PRN
Start: 2012-12-28 — End: 2013-01-04
  Administered 2012-12-28: 10 mg via RECTAL
  Filled 2012-12-28 (×2): qty 1

## 2012-12-28 MED ORDER — GLUCERNA SHAKE PO LIQD
237.0000 mL | ORAL | Status: DC
  Administered 2012-12-28 – 2013-01-02 (×3): 237 mL via ORAL

## 2012-12-28 NOTE — Progress Notes (Signed)
Patient feels that her throat is swollen.  Patient has recently received a gi cocktail.  She has good air movement through out, no stridor or wheezes noted.  BP 149/78, SR 50s, RR 16, O2 sat 100% on RA.  Patient states that it is difficult to swallow and it feels as if something is in her throat.  She had a good cough and swallows water easily.  MD at bedside to assess patient.  PCXR done.  RN to call if assistance needed.

## 2012-12-28 NOTE — Progress Notes (Signed)
PATIENT DETAILS Name: Caroline Nichols Age: 72 y.o. Sex: female Date of Birth: 1941-05-26 Admit Date: 12/26/2012 Admitting Physician Osvaldo Shipper, MD UJW:[id number], Caryn Bee, MD  Subjective: Significant Improvement-able to make a fist, decrease in erythema and swelling of the 5th finger and dorsum of the hand  Assessment/Plan: Principal Problem:   Cellulitis of multiple sites of right hand and fingers -not sure if this is infectious or a Rheumatologic -c/w Vanco and  Zosyn,  -c/w a Naprosyn -since responsding to above-hold off on steroids -RA Factor significantly elevated, Anti-CCP-pending-she very well could have Rh. Arthritis -ANA-pending  Active Problems: Hx of Arthritis -given hx of multiple joints involved-likely she does have a rheumatoid arthritis -trial on Naprosyn seems to be working along with antibiotics,suspect we can hold off on steroids for the time being, and follow he clinical course  HTN -better controlled off IVF -c/w Inderal -on prn Hydralazine as well  Hypothyroidism -c/w Levothyroxine -TSH slightly elevated at 11/7    SMOKER -counseled extensively -on transdermal nicotine  Diabetic Insipidus -c/w DDAVP -monitor Na carefully    Hypokalemia -resolved -check periodically  Disposition: Remain inpatient  DVT Prophylaxis: Prophylactic Lovenox  Code Status: Full code   Family Communication None  Procedures:  None  CONSULTS:  orthopedic surgery   MEDICATIONS: Scheduled Meds: . aspirin EC  81 mg Oral Daily  . desmopressin  0.2 mg Oral QHS  . docusate sodium  100 mg Oral BID  . enoxaparin (LOVENOX) injection  30 mg Subcutaneous Q24H  . levothyroxine  112 mcg Oral QAC breakfast  . naproxen  500 mg Oral TID WC  . nicotine  14 mg Transdermal Daily  . pantoprazole  40 mg Oral Q1200  . piperacillin-tazobactam (ZOSYN)  IV  3.375 g Intravenous Q8H  . propranolol ER  80 mg Oral Daily  . vancomycin  750 mg Intravenous Q24H   Continuous  Infusions:   PRN Meds:.acetaminophen, acetaminophen, ALPRAZolam, hydrALAZINE, morphine injection, ondansetron (ZOFRAN) IV, ondansetron, oxyCODONE  Antibiotics: Anti-infectives   Start     Dose/Rate Route Frequency Ordered Stop   12/27/12 2200  vancomycin (VANCOCIN) IVPB 750 mg/150 ml premix     750 mg 150 mL/hr over 60 Minutes Intravenous Every 24 hours 12/27/12 0113     12/27/12 1400  piperacillin-tazobactam (ZOSYN) IVPB 3.375 g     3.375 g 12.5 mL/hr over 240 Minutes Intravenous 3 times per day 12/27/12 1120     12/26/12 1945  vancomycin (VANCOCIN) IVPB 1000 mg/200 mL premix     1,000 mg 200 mL/hr over 60 Minutes Intravenous  Once 12/26/12 1940 12/26/12 2208       PHYSICAL EXAM: Vital signs in last 24 hours: Filed Vitals:   12/27/12 0815 12/27/12 1428 12/27/12 2125 12/28/12 0531  BP: 163/70 152/76 128/68 131/69  Pulse:  94 54 61  Temp:  97.6 F (36.4 C) 97.5 F (36.4 C) 97.9 F (36.6 C)  TempSrc:  Oral Oral Oral  Resp:  18 18 18   Height:      Weight:      SpO2:  95% 100% 100%    Weight change:  Filed Weights   12/27/12 0029  Weight: 47.219 kg (104 lb 1.6 oz)   Body mass index is 19.04 kg/(m^2).   Gen Exam: Awake and alert with clear speech.   Neck: Supple, No JVD.   Chest: B/L Clear.   CVS: S1 S2 Regular, no murmurs.  Abdomen: soft, BS +, non tender, non distended.  Extremities: no edema, lower extremities warm  to touch.Right hand is swollen. Inflammation and swelling of the right fifth digit is also noted. Significant decrease in Erythema and swelling is noted in the fifth digit as well as the hand dorsally,some erythema/swelling however persists proximally to the wrist joint on the palmer aspect. Range of motion is significantly improved, patient is able to make a fist. Radial pulses are well palpable. Neurologic: Non Focal.   Skin: No Rash.   Wounds: N/A.    Intake/Output from previous day:  Intake/Output Summary (Last 24 hours) at 12/28/12 0732 Last data  filed at 12/27/12 2300  Gross per 24 hour  Intake    480 ml  Output    300 ml  Net    180 ml     LAB RESULTS: CBC  Recent Labs Lab 12/26/12 1945 12/27/12 0214 12/28/12 0355  WBC 5.1 4.8 3.4*  HGB 12.2 11.1* 11.1*  HCT 35.7* 32.9* 33.3*  PLT 234 201 190  MCV 90.6 89.4 89.8  MCH 31.0 30.2 29.9  MCHC 34.2 33.7 33.3  RDW 15.2 15.3 15.3  LYMPHSABS 2.5  --   --   MONOABS 1.2*  --   --   EOSABS 0.0  --   --   BASOSABS 0.0  --   --     Chemistries   Recent Labs Lab 12/26/12 1945 12/27/12 0214 12/28/12 0355  NA 133* 138 131*  K 3.3* 3.3* 3.9  CL 94* 102 96  CO2 28 26 22   GLUCOSE 111* 120* 99  BUN 10 7 15   CREATININE 0.57 0.47* 0.53  CALCIUM 10.1 9.2 9.3    CBG:  Recent Labs Lab 12/27/12 0731 12/27/12 2132  GLUCAP 98 96    GFR Estimated Creatinine Clearance: 48.1 ml/min (by C-G formula based on Cr of 0.53).  Coagulation profile No results found for this basename: INR, PROTIME,  in the last 168 hours  Cardiac Enzymes No results found for this basename: CK, CKMB, TROPONINI, MYOGLOBIN,  in the last 168 hours  No components found with this basename: POCBNP,  No results found for this basename: DDIMER,  in the last 72 hours  Recent Labs  12/27/12 0214  HGBA1C 5.2   No results found for this basename: CHOL, HDL, LDLCALC, TRIG, CHOLHDL, LDLDIRECT,  in the last 72 hours  Recent Labs  12/27/12 0214  TSH 11.793*   No results found for this basename: VITAMINB12, FOLATE, FERRITIN, TIBC, IRON, RETICCTPCT,  in the last 72 hours No results found for this basename: LIPASE, AMYLASE,  in the last 72 hours  Urine Studies No results found for this basename: UACOL, UAPR, USPG, UPH, UTP, UGL, UKET, UBIL, UHGB, UNIT, UROB, ULEU, UEPI, UWBC, URBC, UBAC, CAST, CRYS, UCOM, BILUA,  in the last 72 hours  MICROBIOLOGY: Recent Results (from the past 240 hour(s))  CULTURE, BLOOD (ROUTINE X 2)     Status: None   Collection Time    12/26/12  7:39 PM      Result Value  Range Status   Specimen Description LEFT ANTECUBITAL   Final   Special Requests BOTTLES DRAWN AEROBIC AND ANAEROBIC 4CC   Final   Culture NO GROWTH 1 DAY   Final   Report Status PENDING   Incomplete  CULTURE, BLOOD (ROUTINE X 2)     Status: None   Collection Time    12/26/12  7:48 PM      Result Value Range Status   Specimen Description BLOOD LEFT HAND   Final   Special Requests BOTTLES  DRAWN AEROBIC AND ANAEROBIC 4CC   Final   Culture NO GROWTH 1 DAY   Final   Report Status PENDING   Incomplete    RADIOLOGY STUDIES/RESULTS: Dg Clavicle Left  12/26/2012   *RADIOLOGY REPORT*  Clinical Data: Painful swollen sternoclavicular joint and shoulder pain  LEFT CLAVICLE - 2+ VIEWS  Comparison: Chest radiograph 10/22/2012  Findings: There is mild degenerative change at the acromioclavicular joint.  Artifact overlies the distal clavicle on one view.  There is no clavicular destruction or fracture.  Mild left glenohumeral joint degenerative change.  IMPRESSION: No acute osseous abnormality of the left clavicle.  If there is strong clinical suspicion for sternoclavicular osteomyelitis or other radiographically occult finding, consider CT of the upper chest with contrast for further evaluation (contrast should be injected in the contralateral right side).   Original Report Authenticated By: Christiana Pellant, M.D.   Dg Clavicle Right  12/26/2012   *RADIOLOGY REPORT*  Clinical Data: Pain and swelling of the sternoclavicular joint  RIGHT CLAVICLE - 2+ VIEWS  Comparison: The chest radiograph 10/22/2012  Findings: No clavicular fracture identified.  Mild right AC joint degenerative change.  Right lung apex is clear.  IMPRESSION: No acute abnormality.   Original Report Authenticated By: Christiana Pellant, M.D.   Dg Hand Complete Right  12/26/2012   *RADIOLOGY REPORT*  Clinical Data: Right hand pain and swelling and  RIGHT HAND - COMPLETE 3+ VIEW  Comparison: None.  Findings: There is no evidence of fracture or  dislocation. Significant degenerative change is noted at the first carpometacarpal joint, with mild surrounding bone formation and joint space loss.  Remaining visualized joint spaces are grossly preserved.  The carpal rows are grossly intact, and demonstrate normal alignment.  No significant soft tissue abnormalities are characterized on radiograph.  IMPRESSION:  1.  No evidence of fracture or dislocation. 2.  Significant osteoarthritis at the first carpometacarpal joint.   Original Report Authenticated By: Tonia Ghent, M.D.    Jeoffrey Massed, MD  [facility] Pager:[phone]  If 7PM-7AM, please contact night-coverage [email or web address] Password [facility] 12/28/2012, 7:32 AM   LOS: 2 days

## 2012-12-28 NOTE — Progress Notes (Signed)
Following a dose of GI cocktail-patient had a brief episode of "choking sensation", I was present to evaluate the patient, she had no stridor, and suspected an anxiety component as well. She was given assurance, and now on follow up, all her symptoms have resolved. She is back to her usual baseline, have d/c'd GI cocktail.

## 2012-12-28 NOTE — Progress Notes (Signed)
Orthopedic Tech Progress Note Patient Details:  Caroline Nichols September 28, 1940 [id number]  Patient ID: Caroline Nichols, female   DOB: 07-10-1941, 72 y.o.   MRN: [id number]   Caroline Nichols 12/28/2012, 11:34 AM Pt has right wrist splint on.

## 2012-12-28 NOTE — Progress Notes (Signed)
INITIAL NUTRITION ASSESSMENT  DOCUMENTATION CODES Per approved criteria  -Severe malnutrition in the context of chronic illness   INTERVENTION: 1. Add Glucerna Shake po daily, each supplement provides 220 kcal and 10 grams of protein. 2. Discussed Gluten-Free diet restrictions - pt would like to continue these restrictions while here. Discussed that this diet is limiting, however pt verbalized understanding. 3. RD to continue to follow nutrition care plan  NUTRITION DIAGNOSIS: Inadequate oral intake related to variable appetite and intermittent GI distress as evidenced by pt report and ongoing wt loss.   Goal: Intake to meet >90% of estimated nutrition needs.  Monitor:  weight trends, lab trends, I/O's, PO intake, supplement tolerance  Reason for Assessment: Malnutrition Screening  72 y.o. female  Admitting Dx: Cellulitis of multiple sites of right hand and fingers  ASSESSMENT: Admitted with wrist pain and swelling, chills, loss of appetite. Smokes daily. Work-up reveals possible RA. Ortho saw pt 5/25 and recommended no surgical interventions at this time.  Pt with poor appetite and wt loss x last few years. Pt reports several medical issues. Has been told to avoid cookies, cakes, sweets, and dairy products 2/2 blood sugar issues. Currently ordered for Gluten-Free diet. She states that she limits her grain intake 2/2 blood sugar issues. Dietary recall as folllows:  B: eggs, oatmeal/grits, Rice-Krispies and Almond or Coconut Milk L: brown rice, vegetable (cauliflower, broccoli, green beans, spinach, cabbage, brussel spouts) D: baked/grilled pork chops/chicken; variety of vegetables S: whole grain bread with peanut butter OR Boost Drink mostly water.  Checks blood sugars at home. In AM, run 80-90. In PM, run 110 - 130.  Pt with IBS, dx 7 years ago. Pt increased fiber content of foods since then. Pt has been tested for Celiac's Disease in the past and results were negative.    Nutrition Focused Physical Exam:  Subcutaneous Fat:  Orbital Region: WNL Upper Arm Region: severely depleted Thoracic and Lumbar Region: severely depleted  Muscle:  Temple Region: severely depleted Clavicle Bone Region: severely depleted Clavicle and Acromion Bone Region: severely depleted Scapular Bone Region: n/a Dorsal Hand: severely depleted Patellar Region: severely depleted Anterior Thigh Region: severely depleted Posterior Calf Region: severely depleted  Edema: none  Pt meets criteria for severe MALNUTRITION in the context of chronic illness as evidenced by severe muscle and fat mass loss.  Agreeable to receiving Glucerna Shakes po daily. Pt very concerned about her weight status. Encouraged pt to continue Boost shakes at home and small frequent meals.   Height: Ht Readings from Last 1 Encounters:  12/27/12 5\' 2"  (1.575 m)    Weight: Wt Readings from Last 1 Encounters:  12/27/12 104 lb 1.6 oz (47.219 kg)    Ideal Body Weight: 110 lb  % Ideal Body Weight: 95%  Wt Readings from Last 10 Encounters:  12/27/12 104 lb 1.6 oz (47.219 kg)  09/27/09 113 lb 8 oz (51.483 kg)  05/15/09 119 lb 2.1 oz (54.038 kg)    Usual Body Weight: 125-130 lb approx 2 years ago  % Usual Body Weight: 82%  BMI:  Body mass index is 19.04 kg/(m^2). WNL  Estimated Nutritional Needs: Kcal: 1410 - 1645 kcal Protein: 55 - 65 grams Fluid: at least 1.4 liters daily  Skin: intact  Diet Order: Gluten Restricted  EDUCATION NEEDS: -No education needs identified at this time   Intake/Output Summary (Last 24 hours) at 12/28/12 0940 Last data filed at 12/27/12 2300  Gross per 24 hour  Intake    480 ml  Output    300 ml  Net    180 ml    Last BM: 5/24  Labs:   Recent Labs Lab 12/26/12 1945 12/27/12 0214 12/28/12 0355  NA 133* 138 131*  K 3.3* 3.3* 3.9  CL 94* 102 96  CO2 28 26 22   BUN 10 7 15   CREATININE 0.57 0.47* 0.53  CALCIUM 10.1 9.2 9.3  GLUCOSE 111* 120* 99     CBG (last 3)   Recent Labs  12/27/12 0731 12/27/12 2132  GLUCAP 98 96    Scheduled Meds: . aspirin EC  81 mg Oral Daily  . desmopressin  0.2 mg Oral QHS  . enoxaparin (LOVENOX) injection  30 mg Subcutaneous Q24H  . levothyroxine  112 mcg Oral QAC breakfast  . naproxen  500 mg Oral TID WC  . nicotine  14 mg Transdermal Daily  . pantoprazole  40 mg Oral Q1200  . piperacillin-tazobactam (ZOSYN)  IV  3.375 g Intravenous Q8H  . propranolol ER  80 mg Oral Daily  . senna  2 tablet Oral BID  . vancomycin  750 mg Intravenous Q24H    Continuous Infusions:   Past Medical History  Diagnosis Date  . Hypertension   . Thyroid disease     hyper  . IBS (irritable bowel syndrome)   . Herniated disc   . Pancreatic cyst   . Arthritis   . Hyperthyroidism     Past Surgical History  Procedure Laterality Date  . Tubal ligation    . Cervical spine surgery    . Hammer toe surgery    . Other surgical history      partial hysterectomy  . Hernia repair    . Abdominal hysterectomy      Jarold Motto MS, RD, LDN Pager: [phone] After-hours pager: [phone]

## 2012-12-28 NOTE — Progress Notes (Signed)
UR COMPLETED  

## 2012-12-29 DIAGNOSIS — E890 Postprocedural hypothyroidism: Secondary | ICD-10-CM

## 2012-12-29 LAB — BASIC METABOLIC PANEL
BUN: 9 mg/dL (ref 6–23)
Chloride: 89 mEq/L — ABNORMAL LOW (ref 96–112)
GFR calc Af Amer: >90 mL/min (ref >90)
GFR calc non Af Amer: >90 mL/min (ref >90)
Potassium: 3.1 mEq/L — ABNORMAL LOW (ref 3.5–5.1)
Sodium: 125 mEq/L — ABNORMAL LOW (ref 135–145)

## 2012-12-29 LAB — CBC
HCT: 28.5 % — ABNORMAL LOW (ref 36.0–46.0)
Hemoglobin: 10 g/dL — ABNORMAL LOW (ref 12.0–15.0)
MCHC: 35.1 g/dL (ref 30.0–36.0)
RDW: 14.5 % (ref 11.5–15.5)
WBC: 2 10*3/uL — ABNORMAL LOW (ref 4.0–10.5)

## 2012-12-29 LAB — CYCLIC CITRUL PEPTIDE ANTIBODY, IGG: Cyclic Citrullin Peptide Ab: <2 U/mL (ref 0.0–5.0)

## 2012-12-29 LAB — OSMOLALITY: Osmolality: 254 mOsm/kg — ABNORMAL LOW (ref 275–300)

## 2012-12-29 LAB — SODIUM, URINE, RANDOM: Sodium, Ur: 16 mEq/L

## 2012-12-29 LAB — GLUCOSE, CAPILLARY: Glucose-Capillary: 87 mg/dL (ref 70–99)

## 2012-12-29 MED ORDER — VANCOMYCIN HCL 500 MG IV SOLR
500.0000 mg | Freq: Two times a day (BID) | INTRAVENOUS | Status: DC
  Administered 2012-12-30 – 2012-12-31 (×4): 500 mg via INTRAVENOUS
  Filled 2012-12-29 (×6): qty 500

## 2012-12-29 MED ORDER — NAPROXEN 250 MG PO TABS
250.0000 mg | ORAL_TABLET | Freq: Two times a day (BID) | ORAL | Status: DC
Start: 2012-12-29 — End: 2013-01-03
  Administered 2012-12-29 – 2013-01-03 (×10): 250 mg via ORAL
  Filled 2012-12-29 (×12): qty 1

## 2012-12-29 MED ORDER — SACCHAROMYCES BOULARDII 250 MG PO CAPS
250.0000 mg | ORAL_CAPSULE | Freq: Two times a day (BID) | ORAL | Status: DC
  Administered 2012-12-29 – 2013-01-04 (×12): 250 mg via ORAL
  Filled 2012-12-29 (×13): qty 1

## 2012-12-29 MED ORDER — SODIUM CHLORIDE 0.9 % IV SOLN
INTRAVENOUS | Status: DC
  Administered 2012-12-29: 18:00:00 via INTRAVENOUS

## 2012-12-29 MED ORDER — POTASSIUM CHLORIDE CRYS ER 20 MEQ PO TBCR
40.0000 meq | EXTENDED_RELEASE_TABLET | Freq: Once | ORAL | Status: AC
  Administered 2012-12-29: 40 meq via ORAL
  Filled 2012-12-29 (×2): qty 2

## 2012-12-29 NOTE — Evaluation (Signed)
Physical Therapy Evaluation Patient Details Name: UMAIMA SCHOLTEN MRN: [id number] DOB: 06-Dec-1940 Today's Date: 12/29/2012 Time: 4098-1191 PT Time Calculation (min): 13 min  PT Assessment / Plan / Recommendation Clinical Impression  Pt is a 72 year old female admitted for cellulitis of multiple sites of right hand and fingers.  Pt reports she does not feel well since taking all the meds she has been given here.  Pt would benefit from acute PT services in order to improve independence with transfers and ambulation to prepare for d/c to next venue.  Discussed HHPT vs SNF and pt would like to talk with case manager about her options stating she would like to take things one day at a time.    PT Assessment  Patient needs continued PT services    Follow Up Recommendations  Supervision/Assistance - 24 hour;SNF    Does the patient have the potential to tolerate intense rehabilitation      Barriers to Discharge        Equipment Recommendations  None recommended by PT    Recommendations for Other Services OT consult   Frequency Min 3X/week    Precautions / Restrictions Precautions Precautions: Fall   Pertinent Vitals/Pain n/a     Mobility  Bed Mobility Bed Mobility: Sit to Supine Sit to Supine: 5: Supervision;HOB elevated Transfers Transfers: Sit to Stand;Stand to Sit Sit to Stand: 4: Min guard;With upper extremity assist;From toilet Stand to Sit: 4: Min guard;With upper extremity assist;To bed Details for Transfer Assistance: pt assisted off toilet upon entering room, required assist for undergarments on R side, min/guard for safety Ambulation/Gait Ambulation/Gait Assistance: 4: Min guard Ambulation Distance (Feet): 30 Feet Assistive device: None Ambulation/Gait Assistance Details: pt ambulated around room with IV pole, slow gait, pt reports weakness, declined RW, usually uses SPC Gait Pattern: Step-through pattern;Narrow base of support;Decreased stride length Gait velocity:  slow cautious gait    Exercises     PT Diagnosis: Difficulty walking;Generalized weakness  PT Problem List: Decreased strength;Decreased activity tolerance;Decreased mobility PT Treatment Interventions: Gait training;DME instruction;Stair training;Functional mobility training;Therapeutic activities;Therapeutic exercise;Patient/family education   PT Goals Acute Rehab PT Goals PT Goal Formulation: With patient Time For Goal Achievement: 01/12/13 Potential to Achieve Goals: Good Pt will go Sit to Stand: with modified independence PT Goal: Sit to Stand - Progress: Goal set today Pt will go Stand to Sit: with modified independence PT Goal: Stand to Sit - Progress: Goal set today Pt will Ambulate: 51 - 150 feet;with modified independence;with least restrictive assistive device PT Goal: Ambulate - Progress: Goal set today Pt will Perform Home Exercise Program: with supervision, verbal cues required/provided PT Goal: Perform Home Exercise Program - Progress: Goal set today  Visit Information  Last PT Received On: 12/29/12 Assistance Needed: +1    Subjective Data  Subjective: I'm feeling real weak.  I'll walk around the room but I'm not ready for the hallway yet.   Prior Functioning  Home Living Lives With: Alone Type of Home: House Home Access: Stairs to enter Entrance Stairs-Number of Steps: 3-4 Entrance Stairs-Rails: None Home Layout: One level Home Adaptive Equipment: Straight cane Prior Function Level of Independence: Independent with assistive device(s) Communication Communication: No difficulties Dominant Hand: Right    Cognition  Cognition Arousal/Alertness: Awake/alert Behavior During Therapy: WFL for tasks assessed/performed Overall Cognitive Status: Within Functional Limits for tasks assessed    Extremity/Trunk Assessment Right Upper Extremity Assessment RUE ROM/Strength/Tone: Deficits RUE ROM/Strength/Tone Deficits: unable to extend all fingers, poor grip  strength Left  Upper Extremity Assessment LUE ROM/Strength/Tone: WFL for tasks assessed Right Lower Extremity Assessment RLE ROM/Strength/Tone: Deficits RLE ROM/Strength/Tone Deficits: grossly at least 3+/5 through functional observation however pt reports increased weakness Left Lower Extremity Assessment LLE ROM/Strength/Tone: Deficits LLE ROM/Strength/Tone Deficits: grossly at least 3+/5 through functional observation however pt reports increased weakness   Balance    End of Session PT - End of Session Activity Tolerance: Patient limited by fatigue Patient left: in bed;with call bell/phone within reach;with family/visitor present  GP     Mani Celestin,KATHrine E 12/29/2012, 2:55 PM Zenovia Jarred, PT, DPT 12/29/2012 Pager: [phone]

## 2012-12-29 NOTE — Progress Notes (Signed)
Blood culture results in from [facility] drawn Saturday 12/26/12 (Gram positive cocci in clusters).

## 2012-12-29 NOTE — Progress Notes (Signed)
Repeat Labs shows Na at 125, along with mild hypokalemia and mild leukocpenia. Patient claims to have had diarrhea today, also looks slightly on the dry side as well. Will check c diff PCR, serum osm, urine Osm and Na and gently hydrate overnight.  Belly very is soft on exam. Will continue to monitor Updated daughter at bedside.

## 2012-12-29 NOTE — Progress Notes (Signed)
PATIENT DETAILS Name: Caroline Nichols Age: 72 y.o. Sex: female Date of Birth: 08-Apr-1941 Admit Date: 12/26/2012 Admitting Physician Osvaldo Shipper, MD ZOX:WRUEAV, Caryn Bee, MD  Brief summary  Patient is a 72 year old African American female with a past medical history of "arthritis", hypertension, chronic abdominal pain, hypothyroidism, anxiety, diabetes insipidus on DDAVP who presented with pain and swelling of her right hand and her sternoclavicular joint. She was initially assessed in any event hospital, and then transferred to [facility] for their evaluation. She was seen by hand surgery-Dr. Izora Ribas, who recommended continued IV antibiotics and anti-inflammatories. Her RA factor is significantly elevated, ANA and anti-CCP is still pending. She very well may have underlying rheumatoid arthritis. Her right hand swelling and sternoclavicular joint swelling are significantly better with empiric antibiotics and anti-inflammatory in the form of naproxen. Prednisone was considered, however since she is responding to the above therapy, we are currently holding off- till we are sure that she has no bacteremia and this is not definite infection.  Subjective: Feels weak today. He claims her "stomach hurts". Tolerating diet.  Assessment/Plan: Principal Problem:   Cellulitis of multiple sites of right hand and fingers -not sure if this is infectious or a Rheumatologic, however significant improvement with empiric antibiotics (vancomycin and Zosyn, and naproxen. Today she is easily able to make fist, erythema has almost resolved, swelling is significantly better and has almost resolved as well. -c/w Vanco (Day 4), however will stop Zosyn (day 3) -c/w  Naprosyn- but decrease the dose to 250 mg twice a day-this patient complains of some stomach pain. -since responding to above-hold off on steroids -RA Factor significantly elevated, Anti-CCP-pending-she very well could have Rh.  Arthritis -ANA-pending  Active Problems: Gram positive bacteremia - Blood cultures drawn at [facility] on 5/24-one set is positive for gram-positive cocci in clusters. Await further sensitivities. Already on IV vancomycin. If it comes back positive for staph, he may need further workup.  Hx of Arthritis -given hx of multiple joints involved-likely she does have a rheumatoid arthritis -trial on Naprosyn seems to be working along with antibiotics,suspect we can hold off on steroids for the time being, and follow  clinical course - Her sternoclavicular joint is significantly better as well.  HTN -better controlled off IVF -c/w Inderal -on prn Hydralazine as well  Hypothyroidism -c/w Levothyroxine -TSH slightly elevated at 11/7    SMOKER -counseled extensively -on transdermal nicotine  Diabetic Insipidus -c/w DDAVP -monitor Na carefully    Hypokalemia -resolved -check periodically  Episode of "choking sensation" on 5/26 - This has resolved with just assurance this-suspect this is more from anxiety.  Anxiety - Suspect this is a bit component to ongoing symptoms. Continue with Xanax.  History of chronic abdominal pain - Reviewed outpatient notes-? IBS- apparently she has had quite a extensive workup in the past. - Her belly is soft, she is on a PPI-I have decreased naproxen to 250 mg twice a day (she was on 500 mg 3 times a day until yesterday)  Severe malnutrition in the context of chronic illness  - Continue with supplements  Disposition: Remain inpatient  DVT Prophylaxis: Prophylactic Lovenox  Code Status: Full code   Family Communication None  Procedures:  None  CONSULTS:  orthopedic surgery   MEDICATIONS: Scheduled Meds: . aspirin EC  81 mg Oral Daily  . desmopressin  0.2 mg Oral QHS  . enoxaparin (LOVENOX) injection  30 mg Subcutaneous Q24H  . feeding supplement  237 mL Oral Q24H  .  levothyroxine  112 mcg Oral QAC breakfast  . naproxen  250 mg  Oral BID WC  . nicotine  14 mg Transdermal Daily  . pantoprazole  40 mg Oral Q1200  . potassium chloride  10 mEq Oral Daily  . propranolol ER  80 mg Oral Daily  . senna  2 tablet Oral BID  . vancomycin  750 mg Intravenous Q24H   Continuous Infusions:   PRN Meds:.acetaminophen, acetaminophen, ALPRAZolam, bisacodyl, hydrALAZINE, morphine injection, ondansetron (ZOFRAN) IV, ondansetron, oxyCODONE  Antibiotics: Anti-infectives   Start     Dose/Rate Route Frequency Ordered Stop   12/27/12 2200  vancomycin (VANCOCIN) IVPB 750 mg/150 ml premix     750 mg 150 mL/hr over 60 Minutes Intravenous Every 24 hours 12/27/12 0113     12/27/12 1400  piperacillin-tazobactam (ZOSYN) IVPB 3.375 g  Status:  Discontinued     3.375 g 12.5 mL/hr over 240 Minutes Intravenous 3 times per day 12/27/12 1120 12/29/12 0823   12/26/12 1945  vancomycin (VANCOCIN) IVPB 1000 mg/200 mL premix     1,000 mg 200 mL/hr over 60 Minutes Intravenous  Once 12/26/12 1940 12/26/12 2208       PHYSICAL EXAM: Vital signs in last 24 hours: Filed Vitals:   12/28/12 1529 12/28/12 2104 12/29/12 0504 12/29/12 0521  BP:  161/72 176/83 138/60  Pulse: 54 50 58   Temp:  97.9 F (36.6 C) 97.4 F (36.3 C)   TempSrc:      Resp: 16 16 18    Height:      Weight:      SpO2: 100% 100% 100%     Weight change:  Filed Weights   12/27/12 0029  Weight: 47.219 kg (104 lb 1.6 oz)   Body mass index is 19.04 kg/(m^2).   Gen Exam: Awake and alert with clear speech.   Neck: Supple, No JVD.   Chest: B/L Clear.   CVS: S1 S2 Regular, no murmurs.  Abdomen: soft, BS +, non tender, non distended.  Extremities: no edema, lower extremities warm to touch.Right hand is swollen. Inflammation and swelling of the right fifth digit is also noted. Significant decrease in Erythema and swelling is noted in the fifth digit as well as the hand dorsally,some erythema/swelling however persists proximally to the wrist joint on the palmer aspect. Range of  motion is significantly improved, patient is able to make a fist. Radial pulses are well palpable. Neurologic: Non Focal.   Skin: No Rash.   Wounds: N/A.    Intake/Output from previous day:  Intake/Output Summary (Last 24 hours) at 12/29/12 1020 Last data filed at 12/29/12 0900  Gross per 24 hour  Intake    360 ml  Output      0 ml  Net    360 ml     LAB RESULTS: CBC  Recent Labs Lab 12/26/12 1945 12/27/12 0214 12/28/12 0355  WBC 5.1 4.8 3.4*  HGB 12.2 11.1* 11.1*  HCT 35.7* 32.9* 33.3*  PLT 234 201 190  MCV 90.6 89.4 89.8  MCH 31.0 30.2 29.9  MCHC 34.2 33.7 33.3  RDW 15.2 15.3 15.3  LYMPHSABS 2.5  --   --   MONOABS 1.2*  --   --   EOSABS 0.0  --   --   BASOSABS 0.0  --   --     Chemistries   Recent Labs Lab 12/26/12 1945 12/27/12 0214 12/28/12 0355  NA 133* 138 131*  K 3.3* 3.3* 3.9  CL 94* 102 96  CO2 28 26 22   GLUCOSE 111* 120* 99  BUN 10 7 15   CREATININE 0.57 0.47* 0.53  CALCIUM 10.1 9.2 9.3    CBG:  Recent Labs Lab 12/27/12 0731 12/27/12 2132 12/28/12 1240 12/29/12 0717  GLUCAP 98 96 102* 87    GFR Estimated Creatinine Clearance: 48.1 ml/min (by C-G formula based on Cr of 0.53).  Coagulation profile No results found for this basename: INR, PROTIME,  in the last 168 hours  Cardiac Enzymes No results found for this basename: CK, CKMB, TROPONINI, MYOGLOBIN,  in the last 168 hours  No components found with this basename: POCBNP,  No results found for this basename: DDIMER,  in the last 72 hours  Recent Labs  12/27/12 0214  HGBA1C 5.2   No results found for this basename: CHOL, HDL, LDLCALC, TRIG, CHOLHDL, LDLDIRECT,  in the last 72 hours  Recent Labs  12/27/12 0214  TSH 11.793*   No results found for this basename: VITAMINB12, FOLATE, FERRITIN, TIBC, IRON, RETICCTPCT,  in the last 72 hours No results found for this basename: LIPASE, AMYLASE,  in the last 72 hours  Urine Studies No results found for this basename: UACOL,  UAPR, USPG, UPH, UTP, UGL, UKET, UBIL, UHGB, UNIT, UROB, ULEU, UEPI, UWBC, URBC, UBAC, CAST, CRYS, UCOM, BILUA,  in the last 72 hours  MICROBIOLOGY: Recent Results (from the past 240 hour(s))  CULTURE, BLOOD (ROUTINE X 2)     Status: None   Collection Time    12/26/12  7:39 PM      Result Value Range Status   Specimen Description LEFT ANTECUBITAL   Final   Special Requests BOTTLES DRAWN AEROBIC AND ANAEROBIC 4CC   Final   Culture     Final   Value: GRAM POSITIVE COCCI IN CLUSTERS     Gram Stain Report Called to,Read Back By and Verified With: SHARON [facility] RN ON [id number] AT 0600 BY RESSEGGER R   Report Status PENDING   Incomplete  CULTURE, BLOOD (ROUTINE X 2)     Status: None   Collection Time    12/26/12  7:48 PM      Result Value Range Status   Specimen Description BLOOD LEFT HAND   Final   Special Requests BOTTLES DRAWN AEROBIC AND ANAEROBIC 4CC   Final   Culture NO GROWTH 3 DAYS   Final   Report Status PENDING   Incomplete    RADIOLOGY STUDIES/RESULTS: Dg Clavicle Left  12/26/2012   *RADIOLOGY REPORT*  Clinical Data: Painful swollen sternoclavicular joint and shoulder pain  LEFT CLAVICLE - 2+ VIEWS  Comparison: Chest radiograph 10/22/2012  Findings: There is mild degenerative change at the acromioclavicular joint.  Artifact overlies the distal clavicle on one view.  There is no clavicular destruction or fracture.  Mild left glenohumeral joint degenerative change.  IMPRESSION: No acute osseous abnormality of the left clavicle.  If there is strong clinical suspicion for sternoclavicular osteomyelitis or other radiographically occult finding, consider CT of the upper chest with contrast for further evaluation (contrast should be injected in the contralateral right side).   Original Report Authenticated By: Christiana Pellant, M.D.   Dg Clavicle Right  12/26/2012   *RADIOLOGY REPORT*  Clinical Data: Pain and swelling of the sternoclavicular joint  RIGHT CLAVICLE - 2+ VIEWS  Comparison: The  chest radiograph 10/22/2012  Findings: No clavicular fracture identified.  Mild right AC joint degenerative change.  Right lung apex is clear.  IMPRESSION: No acute abnormality.   Original Report Authenticated  By: Christiana Pellant, M.D.   Dg Hand Complete Right  12/26/2012   *RADIOLOGY REPORT*  Clinical Data: Right hand pain and swelling and  RIGHT HAND - COMPLETE 3+ VIEW  Comparison: None.  Findings: There is no evidence of fracture or dislocation. Significant degenerative change is noted at the first carpometacarpal joint, with mild surrounding bone formation and joint space loss.  Remaining visualized joint spaces are grossly preserved.  The carpal rows are grossly intact, and demonstrate normal alignment.  No significant soft tissue abnormalities are characterized on radiograph.  IMPRESSION:  1.  No evidence of fracture or dislocation. 2.  Significant osteoarthritis at the first carpometacarpal joint.   Original Report Authenticated By: Tonia Ghent, M.D.    Jeoffrey Massed, MD  [facility] Pager:[phone]  If 7PM-7AM, please contact night-coverage [email or web address] Password [email or web address] 12/29/2012, 10:20 AM   LOS: 3 days

## 2012-12-29 NOTE — Progress Notes (Signed)
ANTIBIOTIC CONSULT: VANCOMYCIN  Indication: rt hand cellulitis Vancomycin trough= <5 on 750 mg q24hrs  Vancomycin trough is subtherapeutic. All doses charted. Will give and extra 500 mg Vancomycin tonight and then start Vancomycin 500 mg q12h tomorrow. Will check levels when appropriate.  Cardell Peach, Pharm D

## 2012-12-30 ENCOUNTER — Inpatient Hospital Stay (HOSPITAL_COMMUNITY): Payer: Medicare Other

## 2012-12-30 LAB — BASIC METABOLIC PANEL
Calcium: 8.6 mg/dL (ref 8.4–10.5)
Creatinine, Ser: 0.42 mg/dL — ABNORMAL LOW (ref 0.50–1.10)
GFR calc Af Amer: >90 mL/min (ref >90)
GFR calc non Af Amer: >90 mL/min (ref >90)
Sodium: 126 mEq/L — ABNORMAL LOW (ref 135–145)

## 2012-12-30 LAB — CBC
MCH: 29.3 pg (ref 26.0–34.0)
MCHC: 34.5 g/dL (ref 30.0–36.0)
MCV: 84.8 fL (ref 78.0–100.0)
Platelets: 189 10*3/uL (ref 150–400)
RBC: 3.69 MIL/uL — ABNORMAL LOW (ref 3.87–5.11)
RDW: 14.3 % (ref 11.5–15.5)

## 2012-12-30 LAB — OSMOLALITY, URINE: Osmolality, Ur: 116 mOsm/kg — ABNORMAL LOW (ref 390–1090)

## 2012-12-30 MED ORDER — POTASSIUM CHLORIDE CRYS ER 20 MEQ PO TBCR
40.0000 meq | EXTENDED_RELEASE_TABLET | Freq: Two times a day (BID) | ORAL | Status: AC
  Administered 2012-12-30 (×2): 40 meq via ORAL
  Filled 2012-12-30 (×2): qty 2

## 2012-12-30 MED ORDER — PANTOPRAZOLE SODIUM 40 MG PO TBEC
40.0000 mg | DELAYED_RELEASE_TABLET | Freq: Two times a day (BID) | ORAL | Status: DC
  Administered 2012-12-30 – 2013-01-04 (×10): 40 mg via ORAL
  Filled 2012-12-30 (×11): qty 1

## 2012-12-30 NOTE — Progress Notes (Signed)
PATIENT DETAILS Name: Caroline Nichols Age: 72 y.o. Sex: female Date of Birth: [birth year] Admit Date: 12/26/2012 Admitting Physician Osvaldo Shipper, MD ZOX:WRUEAV, Caryn Bee, MD  Brief summary   Patient is a 72 year old African American female with a past medical history of "arthritis", hypertension, chronic abdominal pain, hypothyroidism, anxiety, diabetes insipidus on DDAVP who presented with pain and swelling of her right hand and her sternoclavicular joint. She was initially assessed in any event hospital, and then transferred to [facility] for their evaluation. She was seen by hand surgery-Dr. Izora Ribas, who recommended continued IV antibiotics and anti-inflammatories. Her RA factor is significantly elevated, ANA and anti-CCP is still pending. She very well may have underlying rheumatoid arthritis. Her right hand swelling and sternoclavicular joint swelling are significantly better with empiric antibiotics and anti-inflammatory in the form of naproxen. Prednisone was considered, however since she is responding to the above therapy, we are currently holding off- till we are sure that she has no bacteremia and this is not definite infection.    Subjective:  Feels overall better, no fever chills, no headache or chest pain, few loose stools, her right wrist pain is slightly worse with some swelling since this morning.   Assessment/Plan:    Cellulitis of multiple sites of right hand and fingers -not sure if this is infectious or a Rheumatologic, however significant improvement with empiric antibiotics (vancomycin and Zosyn, and naproxen.  -c/w Vanco (Day 4), however will stop Zosyn (day 3) -c/w  Naprosyn- but decrease the dose to 250 mg twice a day-this patient complains of some stomach pain. -Her RA Factor significantly elevated, Anti-CCP-pending-she very well could have Rh. Arthritis -ANA-negative I discussed her case with Dr. [name] hand surgeon who has seen the patient in the past,  patient seems to have slightly more fluid in her right wrist today, I will repeat her right wrist x-ray, have requested Dr. [name] to see the patient in the afternoon and try to attempt and aspiration to help [place] decide whether this is inflammation versus infectious arthritis.    Gram positive bacteremia - Blood cultures drawn at [facility] on 5/24-one set is positive for gram-positive cocci in clusters. Await further sensitivities. Already on IV vancomycin. Likely contamination we will monitor closely.    Hx of Arthritis -given hx of multiple joints involved-likely she does have a rheumatoid arthritis -trial on Naprosyn seems to be working along with antibiotics,suspect we can hold off on steroids for the time being, and follow  clinical course - Her sternoclavicular joint is significantly better as well.   HTN -c/w Inderal -on prn Hydralazine as well   Hypothyroidism -c/w Levothyroxine -TSH slightly elevated at 11/7 Repeat TSH post discharge in 4 weeks once she is better from acute illness.      SMOKER -counseled extensively -on transdermal nicotine    Diabetic Insipidus with hyponatremia -c/w DDAVP Urine osmolality and urine sodium and low, she is improved somewhat with normal saline which will be continued, repeat BMP in the morning.     Episode of "choking sensation" on 5/26 - This has resolved with just assurance this-suspect this is more from anxiety.    Anxiety - Suspect this is a bit component to ongoing symptoms. Continue with Xanax.    History of chronic abdominal pain - Reviewed outpatient notes-? IBS- apparently she has had quite a extensive workup in the past. He confirms that this pain is chronic. - Her belly is soft, she is on a PPI we'll increase the dose to  twice a day-I have decreased naproxen to 250 mg twice a day (she was on 500 mg 3 times a day until yesterday)    Severe malnutrition in the context of chronic illness  - Continue with  supplements    Diarrhea likely antibiotic induced.  Rule out C. Difficile     Disposition: Remain inpatient  DVT Prophylaxis: Prophylactic Lovenox  Code Status: Full code   Family Communication None  Procedures:  None  CONSULTS:  orthopedic surgery   MEDICATIONS: Scheduled Meds: . aspirin EC  81 mg Oral Daily  . desmopressin  0.2 mg Oral QHS  . enoxaparin (LOVENOX) injection  30 mg Subcutaneous Q24H  . feeding supplement  237 mL Oral Q24H  . levothyroxine  112 mcg Oral QAC breakfast  . naproxen  250 mg Oral BID WC  . nicotine  14 mg Transdermal Daily  . pantoprazole  40 mg Oral Q1200  . potassium chloride  40 mEq Oral BID  . propranolol ER  80 mg Oral Daily  . saccharomyces boulardii  250 mg Oral BID  . senna  2 tablet Oral BID  . vancomycin  500 mg Intravenous Q12H   Continuous Infusions:   PRN Meds:.acetaminophen, acetaminophen, ALPRAZolam, bisacodyl, hydrALAZINE, morphine injection, ondansetron (ZOFRAN) IV, ondansetron, oxyCODONE  Antibiotics: Anti-infectives   Start     Dose/Rate Route Frequency Ordered Stop   12/29/12 2300  vancomycin (VANCOCIN) 500 mg in sodium chloride 0.9 % 100 mL IVPB     500 mg 100 mL/hr over 60 Minutes Intravenous Every 12 hours 12/29/12 2248     12/27/12 2200  vancomycin (VANCOCIN) IVPB 750 mg/150 ml premix  Status:  Discontinued     750 mg 150 mL/hr over 60 Minutes Intravenous Every 24 hours 12/27/12 0113 12/29/12 2251   12/27/12 1400  piperacillin-tazobactam (ZOSYN) IVPB 3.375 g  Status:  Discontinued     3.375 g 12.5 mL/hr over 240 Minutes Intravenous 3 times per day 12/27/12 1120 12/29/12 0823   12/26/12 1945  vancomycin (VANCOCIN) IVPB 1000 mg/200 mL premix     1,000 mg 200 mL/hr over 60 Minutes Intravenous  Once 12/26/12 1940 12/26/12 2208       PHYSICAL EXAM: Vital signs in last 24 hours: Filed Vitals:   12/29/12 0521 12/29/12 1358 12/29/12 2100 12/30/12 0615  BP: 138/60 162/76 154/99 145/86  Pulse:  53 54  50  Temp:  97.9 F (36.6 C) 97.6 F (36.4 C) 97.6 F (36.4 C)  TempSrc:      Resp:  16 18 18   Height:      Weight:      SpO2:  96% 98% 99%    Weight change:  Filed Weights   12/27/12 0029  Weight: 47.219 kg (104 lb 1.6 oz)   Body mass index is 19.04 kg/(m^2).   Gen Exam: Awake and alert with clear speech.   Neck: Supple, No JVD.   Chest: B/L Clear.   CVS: S1 S2 Regular, no murmurs.  Abdomen: soft, BS +, non tender, non distended.  Extremities: no edema, lower extremities warm to touch.Right hand is swollen. Inflammation and swelling of the right fifth digit is also noted. Significant decrease in Erythema and swelling is noted in the fifth digit as well as the hand dorsally,some erythema/swelling however persists proximally to the wrist joint on the palmer aspect. Range of motion is significantly improved, patient is able to make a fist. Radial pulses are well palpable. Neurologic: Non Focal.   Skin: No Rash.  Wounds: N/A.    Intake/Output from previous day:  Intake/Output Summary (Last 24 hours) at 12/30/12 0936 Last data filed at 12/29/12 1700  Gross per 24 hour  Intake    480 ml  Output      0 ml  Net    480 ml     LAB RESULTS: CBC  Recent Labs Lab 12/26/12 1945 12/27/12 0214 12/28/12 0355 12/29/12 1133 12/30/12 0518  WBC 5.1 4.8 3.4* 2.0* 2.0*  HGB 12.2 11.1* 11.1* 10.0* 10.8*  HCT 35.7* 32.9* 33.3* 28.5* 31.3*  PLT 234 201 190 183 189  MCV 90.6 89.4 89.8 86.1 84.8  MCH 31.0 30.2 29.9 30.2 29.3  MCHC 34.2 33.7 33.3 35.1 34.5  RDW 15.2 15.3 15.3 14.5 14.3  LYMPHSABS 2.5  --   --   --   --   MONOABS 1.2*  --   --   --   --   EOSABS 0.0  --   --   --   --   BASOSABS 0.0  --   --   --   --     Chemistries   Recent Labs Lab 12/26/12 1945 12/27/12 0214 12/28/12 0355 12/29/12 1133 12/30/12 0518  NA 133* 138 131* 125* 126*  K 3.3* 3.3* 3.9 3.1* 3.2*  CL 94* 102 96 89* 91*  CO2 28 26 22 22 22   GLUCOSE 111* 120* 99 75 73  BUN 10 7 15 9 6    CREATININE 0.57 0.47* 0.53 0.55 0.42*  CALCIUM 10.1 9.2 9.3 8.4 8.6    CBG:  Recent Labs Lab 12/27/12 0731 12/27/12 2132 12/28/12 1240 12/29/12 0717  GLUCAP 98 96 102* 87    GFR Estimated Creatinine Clearance: 48.1 ml/min (by C-G formula based on Cr of 0.42).  Coagulation profile No results found for this basename: INR, PROTIME,  in the last 168 hours  Cardiac Enzymes No results found for this basename: CK, CKMB, TROPONINI, MYOGLOBIN,  in the last 168 hours  No components found with this basename: POCBNP,  No results found for this basename: DDIMER,  in the last 72 hours No results found for this basename: HGBA1C,  in the last 72 hours No results found for this basename: CHOL, HDL, LDLCALC, TRIG, CHOLHDL, LDLDIRECT,  in the last 72 hours No results found for this basename: TSH, T4TOTAL, FREET3, T3FREE, THYROIDAB,  in the last 72 hours No results found for this basename: VITAMINB12, FOLATE, FERRITIN, TIBC, IRON, RETICCTPCT,  in the last 72 hours No results found for this basename: LIPASE, AMYLASE,  in the last 72 hours  Urine Studies No results found for this basename: UACOL, UAPR, USPG, UPH, UTP, UGL, UKET, UBIL, UHGB, UNIT, UROB, ULEU, UEPI, UWBC, URBC, UBAC, CAST, CRYS, UCOM, BILUA,  in the last 72 hours  MICROBIOLOGY: Recent Results (from the past 240 hour(s))  CULTURE, BLOOD (ROUTINE X 2)     Status: None   Collection Time    12/26/12  7:39 PM      Result Value Range Status   Specimen Description LEFT ANTECUBITAL   Final   Special Requests BOTTLES DRAWN AEROBIC AND ANAEROBIC 4CC   Final   Culture     Final   Value: GRAM POSITIVE COCCI IN CLUSTERS     Gram Stain Report Called to,Read Back By and Verified With: [facility] RN ON [id number] AT 0600 BY RESSEGGER R     Performed at [facility]   Report Status PENDING   Incomplete  CULTURE, BLOOD (  ROUTINE X 2)     Status: None   Collection Time    12/26/12  7:48 PM      Result Value Range Status   Specimen  Description BLOOD LEFT HAND   Final   Special Requests BOTTLES DRAWN AEROBIC AND ANAEROBIC 4CC   Final   Culture NO GROWTH 3 DAYS   Final   Report Status PENDING   Incomplete  CLOSTRIDIUM DIFFICILE BY PCR     Status: None   Collection Time    12/29/12 10:31 PM      Result Value Range Status   C difficile by pcr NEGATIVE  NEGATIVE Final    RADIOLOGY STUDIES/RESULTS: Dg Clavicle Left  12/26/2012   *RADIOLOGY REPORT*  Clinical Data: Painful swollen sternoclavicular joint and shoulder pain  LEFT CLAVICLE - 2+ VIEWS  Comparison: Chest radiograph 10/22/2012  Findings: There is mild degenerative change at the acromioclavicular joint.  Artifact overlies the distal clavicle on one view.  There is no clavicular destruction or fracture.  Mild left glenohumeral joint degenerative change.  IMPRESSION: No acute osseous abnormality of the left clavicle.  If there is strong clinical suspicion for sternoclavicular osteomyelitis or other radiographically occult finding, consider CT of the upper chest with contrast for further evaluation (contrast should be injected in the contralateral right side).   Original Report Authenticated By: Christiana Pellant, M.D.   Dg Clavicle Right  12/26/2012   *RADIOLOGY REPORT*  Clinical Data: Pain and swelling of the sternoclavicular joint  RIGHT CLAVICLE - 2+ VIEWS  Comparison: The chest radiograph 10/22/2012  Findings: No clavicular fracture identified.  Mild right AC joint degenerative change.  Right lung apex is clear.  IMPRESSION: No acute abnormality.   Original Report Authenticated By: Christiana Pellant, M.D.   Dg Hand Complete Right  12/26/2012   *RADIOLOGY REPORT*  Clinical Data: Right hand pain and swelling and  RIGHT HAND - COMPLETE 3+ VIEW  Comparison: None.  Findings: There is no evidence of fracture or dislocation. Significant degenerative change is noted at the first carpometacarpal joint, with mild surrounding bone formation and joint space loss.  Remaining visualized  joint spaces are grossly preserved.  The carpal rows are grossly intact, and demonstrate normal alignment.  No significant soft tissue abnormalities are characterized on radiograph.  IMPRESSION:  1.  No evidence of fracture or dislocation. 2.  Significant osteoarthritis at the first carpometacarpal joint.   Original Report Authenticated By: Tonia Ghent, M.D.    Leroy Sea, MD  [facility] Pager:[phone]  If 7PM-7AM, please contact night-coverage [email or web address] Password [facility] 12/30/2012, 9:36 AM   LOS: 4 days

## 2012-12-30 NOTE — Evaluation (Signed)
Occupational Therapy Evaluation Patient Details Name: Caroline Nichols MRN: [id number] DOB: 1941/07/17 Today's Date: 12/30/2012 Time: 4098-1191 OT Time Calculation (min): 29 min  OT Assessment / Plan / Recommendation Clinical Impression    Pt is a 72 year old female admitted for cellulitis of multiple sites of right hand and fingers. Pt would benefit from acute OT services in order to improve independence to prepare for d/c. Pt and one daughter spoke about pt d/c'ing to stay with other daughter for a while, but the daughter will not be available 24/7 for assist.      OT Assessment  Patient needs continued OT Services    Follow Up Recommendations  Supervision/Assistance - 24 hour;SNF    Barriers to Discharge Decreased caregiver support    Equipment Recommendations  3 in 1 bedside comode    Recommendations for Other Services    Frequency  Min 2X/week    Precautions / Restrictions Precautions Precautions: Fall Restrictions Weight Bearing Restrictions: No   Pertinent Vitals/Pain Pain 5/10 in right hand/forearm. Nurse notified. Iced and elevated Right UE.     ADL  Eating/Feeding: Independent Where Assessed - Eating/Feeding: Edge of bed Grooming: Set up Where Assessed - Grooming: Unsupported sitting Upper Body Bathing: Minimal assistance Where Assessed - Upper Body Bathing: Unsupported sitting Lower Body Bathing: Minimal assistance Where Assessed - Lower Body Bathing: Supported sit to stand Upper Body Dressing: Minimal assistance Where Assessed - Upper Body Dressing: Unsupported sitting Lower Body Dressing: Minimal assistance Where Assessed - Lower Body Dressing: Supported sit to stand Toilet Transfer: Simulated;Min Pension scheme manager Method: Sit to Barista: Regular height toilet Tub/Shower Transfer Method: Not assessed Equipment Used: Upper extremity splints;Other (comment) (straight cane) ADL Comments: Pt at Min A level for ADLs. Able to don/doff  socks while sitting on EOB. Performed gentle ROM of rt digits. Iced and elevated at end of session. Reports having difficulty with buttons at times.    OT Diagnosis: Acute pain;Generalized weakness  OT Problem List: Decreased strength;Decreased range of motion;Decreased activity tolerance;Impaired balance (sitting and/or standing);Decreased knowledge of use of DME or AE;Pain;Impaired UE functional use OT Treatment Interventions: Self-care/ADL training;Therapeutic exercise;DME and/or AE instruction;Therapeutic activities;Patient/family education;Balance training   OT Goals Acute Rehab OT Goals OT Goal Formulation: With patient Time For Goal Achievement: 01/06/13 Potential to Achieve Goals: Good ADL Goals Pt Will Perform Upper Body Bathing: with modified independence;Sitting, chair ADL Goal: Upper Body Bathing - Progress: Goal set today Pt Will Perform Lower Body Bathing: with modified independence;Sit to stand from chair ADL Goal: Lower Body Bathing - Progress: Goal set today Pt Will Perform Upper Body Dressing: with modified independence;Sitting, chair;Sitting, bed ADL Goal: Upper Body Dressing - Progress: Goal set today Pt Will Perform Lower Body Dressing: with modified independence;Sit to stand from bed;Sit to stand from chair ADL Goal: Lower Body Dressing - Progress: Goal set today Pt Will Transfer to Toilet: with modified independence;Ambulation ADL Goal: Toilet Transfer - Progress: Goal set today Pt Will Perform Toileting - Clothing Manipulation: with modified independence;Standing ADL Goal: Toileting - Clothing Manipulation - Progress: Goal set today Pt Will Perform Toileting - Hygiene: with modified independence;Sit to stand from 3-in-1/toilet;Sitting on 3-in-1 or toilet ADL Goal: Toileting - Hygiene - Progress: Goal set today Pt Will Perform Tub/Shower Transfer: Tub transfer;with supervision;with DME;Ambulation ADL Goal: Tub/Shower Transfer - Progress: Goal set today Miscellaneous  OT Goals Miscellaneous OT Goal #1: Pt will be independent of gentle ROM exercises of Rt digits.  OT Goal: Miscellaneous Goal #1 -  Progress: Goal set today  Visit Information  Last OT Received On: 12/30/12 Assistance Needed: +1    Subjective Data      Prior Functioning     Home Living Lives With: Alone Available Help at Discharge: Family;Available PRN/intermittently (pt planning to stay with daughter) Type of Home: House Home Access: Stairs to enter [employer] of Steps: 3-4 Entrance Stairs-Rails: None Home Layout: One level Bathroom Shower/Tub: Engineer, manufacturing systems: Standard Home Adaptive Equipment: Straight cane Prior Function Level of Independence: Independent with assistive device(s) Communication Communication: No difficulties Dominant Hand: Right         Vision/Perception     Cognition  Cognition Arousal/Alertness: Awake/alert Behavior During Therapy: WFL for tasks assessed/performed Overall Cognitive Status: Within Functional Limits for tasks assessed    Extremity/Trunk Assessment Right Upper Extremity Assessment RUE ROM/Strength/Tone: Deficits RUE ROM/Strength/Tone Deficits: unable to make full fist: Pt with approx 150 degrees shoulder flexion Left Upper Extremity Assessment LUE ROM/Strength/Tone: Deficits LUE ROM/Strength/Tone Deficits: Pt with approx 150 degrees shoulder flexion     Mobility Bed Mobility Bed Mobility: Sit to Supine Sit to Supine: 6: Modified independent (Device/Increase time) Details for Bed Mobility Assistance: increased time Transfers Transfers: Sit to Stand;Stand to Sit Sit to Stand: From bed;From toilet;4: Min guard Stand to Sit: 5: Supervision;To bed;4: Min guard;To toilet Details for Transfer Assistance: supervision to sit on bed and minguard to sit on simulated regular height toilet for safety.     Exercise     Balance Balance Balance Assessed: No   End of Session OT - End of Session Equipment  Utilized During Treatment: Other (comment) (UE splint and straight cane) Activity Tolerance: Patient tolerated treatment well Patient left: in bed;with family/visitor present Nurse Communication: Other (comment) (pain level)  GO     Earlie Raveling OTR/L [phone] 12/30/2012, 4:46 PM

## 2012-12-30 NOTE — Progress Notes (Signed)
Physical Therapy Treatment Patient Details Name: Caroline Nichols MRN: [id number] DOB: June 18, 1941 Today's Date: 12/30/2012 Time: 4098-1191 PT Time Calculation (min): 17 min  PT Assessment / Plan / Recommendation Comments on Treatment Session  pt progressling slowly. Cont to demo unsteady gt. Recommend R platform RW to increase stability. Will cont to f/u with pt to maximize mobility.    Follow Up Recommendations  Supervision/Assistance - 24 hour;SNF     Does the patient have the potential to tolerate intense rehabilitation     Barriers to Discharge        Equipment Recommendations   (3 in 1; platfrom RW)    Recommendations for Other Services    Frequency Min 3X/week   Plan Discharge plan remains appropriate;Frequency needs to be updated    Precautions / Restrictions Precautions Precautions: Fall Restrictions Weight Bearing Restrictions: No   Pertinent Vitals/Pain 5/10 in R hand; reports it is "much better this afternoon than it was this morning"    Mobility  Bed Mobility Bed Mobility: Supine to Sit Sit to Supine: 6: Modified independent (Device/Increase time);HOB elevated;With rail Details for Bed Mobility Assistance: demo good technique requires increased time due to pain and weakness  Transfers Transfers: Sit to Stand;Stand to Sit Sit to Stand: From bed;4: Min guard Stand to Sit: 5: Supervision;To bed Details for Transfer Assistance: verbal cues for safety; assistance for safety and to steady  Ambulation/Gait Ambulation/Gait Assistance: 4: Min guard Ambulation Distance (Feet): 40 Feet Assistive device: Straight cane Ambulation/Gait Assistance Details: pt unsteady with gt; discussed balance deficits with pt; pt feels as though she is slightly unsteady and weak; agreeable to platform RW Gait Pattern: Step-through pattern;Narrow base of support Gait velocity: slow cautious gait Stairs: No Wheelchair Mobility Wheelchair Mobility: No    Exercises     PT Diagnosis:     PT Problem List:   PT Treatment Interventions:     PT Goals Acute Rehab PT Goals PT Goal Formulation: With patient Time For Goal Achievement: 01/12/13 Potential to Achieve Goals: Good PT Goal: Sit to Stand - Progress: Progressing toward goal PT Goal: Stand to Sit - Progress: Progressing toward goal PT Goal: Ambulate - Progress: Progressing toward goal PT Goal: Perform Home Exercise Program - Progress: Progressing toward goal  Visit Information  Last PT Received On: 12/30/12 Assistance Needed: +1 PT/OT Co-Evaluation/Treatment: Yes    Subjective Data  Subjective: pt lying supine with daughter present; agreeable to therapy  Patient Stated Goal: home with daughter   Cognition  Cognition Arousal/Alertness: Awake/alert Behavior During Therapy: WFL for tasks assessed/performed Overall Cognitive Status: Within Functional Limits for tasks assessed    Balance  Balance Balance Assessed: No  End of Session PT - End of Session Equipment Utilized During Treatment: Gait belt Activity Tolerance: Patient tolerated treatment well Patient left: in bed;with family/visitor present;Other (comment) (OT present; pt sitting on EOB) Nurse Communication: Mobility status;Other (comment) (DME NEEDs to case manager)   GP     Shelva Majestic [place], [place] [phone] 12/30/2012, 3:57 PM

## 2012-12-30 NOTE — Progress Notes (Signed)
S:  Pt states wrist, hand started hurting today after BP was taken on that arm  O:Blood pressure 140/76, pulse 56, temperature 97.9 F (36.6 C), temperature source Oral, resp. rate 16, height 5\' 2"  (1.575 m), weight 47.219 kg (104 lb 1.6 oz), SpO2 98.00%. Results for orders placed during the hospital encounter of 12/26/12  CULTURE, BLOOD (ROUTINE X 2)     Status: None   Collection Time    12/26/12  7:39 PM      Result Value Range Status   Specimen Description BLOOD LEFT ANTECUBITAL   Final   Special Requests BOTTLES DRAWN AEROBIC AND ANAEROBIC 4CC   Final   Culture  Setup Time 12/29/2012 14:36   Final   Culture     Final   Value: GRAM POSITIVE COCCI IN CLUSTERS     Note: Gram Stain Report Called to,Read Back By and Verified With: SHARON [facility] 12/29/12 0600 RESSEGGER R  Performed at [facility]   Report Status PENDING   Incomplete  CULTURE, BLOOD (ROUTINE X 2)     Status: None   Collection Time    12/26/12  7:48 PM      Result Value Range Status   Specimen Description BLOOD LEFT HAND   Final   Special Requests BOTTLES DRAWN AEROBIC AND ANAEROBIC 4CC   Final   Culture NO GROWTH 4 DAYS   Final   Report Status PENDING   Incomplete  CLOSTRIDIUM DIFFICILE BY PCR     Status: None   Collection Time    12/29/12 10:31 PM      Result Value Range Status   C difficile by pcr NEGATIVE  NEGATIVE Final   R wrist with some erythema, swelling, almost seems superficial ( tendinitis related rather that wrist joint), painful to move wrist and to extend, flex fingers, SF with less erythema, swelling than the other day. R Wrist prepped and aspirated at bedside, minimal fluid obtained, sent to lab for cell count, gstain, cx    A: erythema R wrist - ?inflammatory vs infectious - still favor inflammatory, but will see with fluid analysis   P:  Would cont abx, ? Need for IV, await fluid cx results, cont anti-inflammatories, splint R hand for comfort

## 2012-12-31 LAB — BASIC METABOLIC PANEL
Calcium: 9.5 mg/dL (ref 8.4–10.5)
GFR calc Af Amer: >90 mL/min (ref >90)
GFR calc non Af Amer: >90 mL/min (ref >90)
Glucose, Bld: 88 mg/dL (ref 70–99)
Potassium: 4.5 mEq/L (ref 3.5–5.1)
Sodium: 129 mEq/L — ABNORMAL LOW (ref 135–145)

## 2012-12-31 LAB — TROPONIN I: Troponin I: <0.3 ng/mL (ref <0.30)

## 2012-12-31 LAB — CULTURE, BLOOD (ROUTINE X 2)

## 2012-12-31 LAB — GLUCOSE, CAPILLARY: Glucose-Capillary: 85 mg/dL (ref 70–99)

## 2012-12-31 LAB — VANCOMYCIN, TROUGH: Vancomycin Tr: 5.7 ug/mL — ABNORMAL LOW (ref 10.0–20.0)

## 2012-12-31 MED ORDER — LOPERAMIDE HCL 2 MG PO CAPS
2.0000 mg | ORAL_CAPSULE | ORAL | Status: DC | PRN
  Filled 2012-12-31: qty 1

## 2012-12-31 MED ORDER — VANCOMYCIN HCL IN DEXTROSE 750-5 MG/150ML-% IV SOLN
750.0000 mg | Freq: Two times a day (BID) | INTRAVENOUS | Status: DC
  Administered 2012-12-31 – 2013-01-04 (×8): 750 mg via INTRAVENOUS
  Filled 2012-12-31 (×9): qty 150

## 2012-12-31 MED ORDER — LEVOTHYROXINE SODIUM 125 MCG PO TABS
125.0000 ug | ORAL_TABLET | Freq: Every day | ORAL | Status: DC
  Administered 2013-01-01 – 2013-01-04 (×4): 125 ug via ORAL
  Filled 2012-12-31 (×6): qty 1

## 2012-12-31 MED ORDER — SODIUM CHLORIDE 0.9 % IV SOLN
INTRAVENOUS | Status: AC
  Administered 2012-12-31: 09:00:00 via INTRAVENOUS

## 2012-12-31 MED ORDER — MORPHINE SULFATE 2 MG/ML IJ SOLN: 0.5000 mg | INTRAMUSCULAR | Status: DC | PRN

## 2012-12-31 MED ORDER — NITROGLYCERIN 0.4 MG SL SUBL
0.4000 mg | SUBLINGUAL_TABLET | SUBLINGUAL | Status: DC | PRN
  Administered 2012-12-31 (×3): 0.4 mg via SUBLINGUAL

## 2012-12-31 MED ORDER — PROPRANOLOL HCL 40 MG PO TABS: 40.0000 mg | ORAL_TABLET | Freq: Once | ORAL | Status: DC

## 2012-12-31 MED ORDER — NITROGLYCERIN 0.4 MG SL SUBL
SUBLINGUAL_TABLET | SUBLINGUAL | Status: AC
  Administered 2012-12-31: 23:00:00
  Filled 2012-12-31: qty 25

## 2012-12-31 MED ORDER — ATROPINE SULFATE 1 MG/ML IJ SOLN
1.0000 mg | Freq: Every day | INTRAMUSCULAR | Status: DC | PRN
  Filled 2012-12-31: qty 1

## 2012-12-31 NOTE — Progress Notes (Signed)
Patient HR dropped to 29.Patient asymptomatic laying in bed. RN notified by [employer]. MD notified. Decreased propranolol dose in AM to 40mg . Will draw TSH now. 1mg  atropine bedside to be administered if HR<30 or systolic BP<100. Ambulating patient now per MD order. Will continue to monitor.

## 2012-12-31 NOTE — Care Management Note (Addendum)
CARE MANAGEMENT NOTE 12/31/2012  Patient:  Caroline Nichols, Caroline Nichols   Account Number:  [id number]  Date Initiated:  12/31/2012  Documentation initiated by:  Vance Peper  Subjective/Objective Assessment:   72 yr old female admited with right wrist swelling and pain.     Action/Plan:   patient will need DME for home. She will go home with daughter Demetrios Isaacs : [address] [place] [address]  [phone]  Choice offered to daughter.    Anticipated DC Date:  01/01/2013   Anticipated DC Plan:  HOME W HOME HEALTH SERVICES      DC Planning Services  CM consult      [facility]  HOME HEALTH  DURABLE MEDICAL EQUIPMENT   Choice offered to / List presented to:  C-4 Adult Children   DME arranged  3-N-1  WALKER - PLATFORM      DME agency  [facility]     HH arranged  HH-2 PT  HH-4 NURSE'S AIDE  HH-3 OT      HH agency  [facility]   Status of service:  Completed, signed off Medicare Important Message given?   (If response is "NO", the following Medicare IM given date fields will be blank) Date Medicare IM given:   Date Additional Medicare IM given:    Discharge Disposition:  HOME W HOME HEALTH SERVICES  Per UR Regulation:    If discussed at Long Length of Stay Meetings, dates discussed:    Comments:

## 2012-12-31 NOTE — Progress Notes (Signed)
Occupational Therapy Treatment Patient Details Name: Caroline Nichols MRN: [id number] DOB: [birth year] Today's Date: 12/31/2012 Time: 8295-6213 OT Time Calculation (min): 24 min  OT Assessment / Plan / Recommendation Comments on Treatment Session  Pt reports improved pain and movement Rt. UE.  Pt moves very cautiously, and requires encouragement to do activities for herself.  Session limited due to c/o nausea    Follow Up Recommendations  Supervision/Assistance - 24 hour;SNF    Barriers to Discharge       Equipment Recommendations  3 in 1 bedside comode    Recommendations for Other Services    Frequency Min 2X/week   Plan Discharge plan remains appropriate    Precautions / Restrictions Precautions Precautions: Fall Restrictions Weight Bearing Restrictions: No   Pertinent Vitals/Pain     ADL  Eating/Feeding: Set up Where Assessed - Eating/Feeding: Edge of bed ADL Comments: Pt with c/o nausea which limites activity today.  Gentle ROM performed digits, wrist and hand, with encouragement    OT Diagnosis:    OT Problem List:   OT Treatment Interventions:     OT Goals Acute Rehab OT Goals OT Goal Formulation: With patient Time For Goal Achievement: 01/06/13 Potential to Achieve Goals: Good ADL Goals Pt Will Perform Upper Body Bathing: with modified independence;Sitting, chair Pt Will Perform Lower Body Bathing: with modified independence;Sit to stand from chair Pt Will Perform Upper Body Dressing: with modified independence;Sitting, chair;Sitting, bed Pt Will Perform Lower Body Dressing: with modified independence;Sit to stand from bed;Sit to stand from chair Pt Will Transfer to Toilet: with modified independence;Ambulation Pt Will Perform Toileting - Clothing Manipulation: with modified independence;Standing Pt Will Perform Toileting - Hygiene: with modified independence;Sit to stand from 3-in-1/toilet;Sitting on 3-in-1 or toilet Pt Will Perform Tub/Shower Transfer: Tub  transfer;with supervision;with DME;Ambulation Miscellaneous OT Goals Miscellaneous OT Goal #1: Pt will be independent of gentle ROM exercises of Rt digits.  OT Goal: Miscellaneous Goal #1 - Progress: Progressing toward goals  Visit Information  Last OT Received On: 12/31/12 Assistance Needed: +1    Subjective Data      Prior Functioning       Cognition  Cognition Arousal/Alertness: Awake/alert Behavior During Therapy: WFL for tasks assessed/performed Overall Cognitive Status: Within Functional Limits for tasks assessed    Mobility  Bed Mobility Bed Mobility: Supine to Sit;Sitting - Scoot to Edge of Bed Supine to Sit: 6: Modified independent (Device/Increase time);With rails Sitting - Scoot to Edge of Bed: 6: Modified independent (Device/Increase time)    Exercises  General Exercises - Upper Extremity Shoulder Flexion: AROM;Right;10 reps;Supine Wrist Flexion: AAROM;Right;10 reps;Supine Wrist Extension: AAROM;Right;10 reps;Supine Digit Composite Flexion: AROM;Right;10 reps;Supine Composite Extension: AROM;Right;10 reps;Supine Hand Exercises Forearm Supination: AROM;10 reps;Right;Supine Forearm Pronation: AROM;Right;10 reps;Supine Wrist Ulnar Deviation: AAROM;Right;10 reps;Supine Wrist Radial Deviation: AAROM;Right;10 reps;Supine Other Exercises Other Exercises: Thumb circles x 10 reps Other Exercises: Thumb flexion/ext x 10 reps Other Exercises: Thumb IP flex/ext x 10 reps   Balance     End of Session OT - End of Session Activity Tolerance: Patient tolerated treatment well Patient left: in bed;with call bell/phone within reach;with family/visitor present  GO     Hyun Reali, Ursula Alert M 12/31/2012, 12:12 PM

## 2012-12-31 NOTE — Progress Notes (Addendum)
PATIENT DETAILS Name: Caroline Nichols Age: 72 y.o. Sex: female Date of Birth: 1941-05-09 Admit Date: 12/26/2012 Admitting Physician Osvaldo Shipper, MD ZOX:[id number], Caryn Bee, MD  Brief summary   Patient is a 72 year old African American female with a past medical history of "arthritis", hypertension, chronic abdominal pain, hypothyroidism, anxiety, diabetes insipidus on DDAVP who presented with pain and swelling of her right hand and her sternoclavicular joint. She was initially assessed in any event hospital, and then transferred to [facility] for their evaluation. She was seen by hand surgery-Dr. Izora Ribas, who recommended continued IV antibiotics and anti-inflammatories. Her RA factor is significantly elevated, ANA and anti-CCP is still pending. She very well may have underlying rheumatoid arthritis. Her right hand swelling and sternoclavicular joint swelling are significantly better with empiric antibiotics and anti-inflammatory in the form of naproxen. Prednisone was considered, however since she is responding to the above therapy, we are currently holding off- till we are sure that she has no bacteremia and this is not definite infection.    Subjective:  Feels overall better, no fever chills, no headache or chest pain, few loose stools, her right wrist pain is slightly worse with some swelling since this morning.   Assessment/Plan:    Cellulitis of multiple sites of right hand and fingers -not sure if this is infectious or a Rheumatologic, however significant improvement with empiric antibiotics (vancomycin and Zosyn, and naproxen) -c/w Vanco (Day 6)  -c/w  Naprosyn- but decrease the dose to 250 mg twice a day-this patient complains of some stomach pain. -Her RA Factor significantly elevated, Anti-CCP-pending-she very well could have Rh. Arthritis -ANA-negative I discussed her case with Dr. [name] hand surgeon on 12/30/2012, Dr. [name] aspirated her right wrist, unfortunately fluid  was minimal it could only be set up for cultures, since infection is still a possibility but we'll wait for cultures for the next 48 hours, if cultures are negative please discuss with Dr. [name] again if a therapeutic trial of steroids will be prudent..    Gram positive bacteremia - Blood cultures drawn at [facility] on 5/24-one set is positive for gram-positive cocci in clusters. Await further sensitivities. Already on IV vancomycin. Likely contamination we will monitor closely.    Hx of Arthritis -given hx of multiple joints involved-likely she does have a rheumatoid arthritis -trial on Naprosyn seems to be working along with antibiotics,suspect we can hold off on steroids for the time being, and follow  clinical course - Her sternoclavicular joint is significantly better as well.   HTN -D/C Inderal, add Norvasc -on prn Hydralazine as well   Hypothyroidism -c/w Levothyroxine -TSH slightly elevated at 11/7. Repeat TSH ,with free T3 and T4      SMOKER -counseled extensively -on transdermal nicotine    Diabetic Insipidus with hyponatremia -c/w DDAVP Urine osmolality and urine sodium and low, sodium is improving on normal saline, this really represents true dehydration, continue normal saline, repeat BMP again on 01/01/2013     Episode of "choking sensation" on 5/26 - This has resolved with just assurance this-suspect this is more from anxiety.    Anxiety - Suspect this is a bit component to ongoing symptoms. Continue with Xanax.    History of chronic abdominal pain - Reviewed outpatient notes-? IBS- apparently she has had quite a extensive workup in the past. He confirms that this pain is chronic. - Her belly is soft, she is on a PPI we'll increase the dose to twice a day-I have decreased naproxen to 250  mg twice a day (she was on 500 mg 3 times a day until yesterday)    Severe malnutrition in the context of chronic illness  - Continue with  supplements    Diarrhea likely antibiotic induced.  Diarrhea is antibiotic induced C. difficile negative, will add Imodium when necessary.   Addendum at 6.55 was paged pt had an episode of asymptomatic S.Brady in bed, will reduce Narcotics, DC Inderal, Atropine PRN, check Trop, TSH, Echo, BP stable, H rate up within seconds by itself, tele. Synthroid increased for now as TSH mildly high.     Disposition: Remain inpatient  DVT Prophylaxis: Prophylactic Lovenox  Code Status: Full code   Family Communication None  Procedures:  Right wrist fluid aspiration by Dr. [name] on 12/30/2012  CONSULTS:  orthopedic surgery   MEDICATIONS: Scheduled Meds: . aspirin EC  81 mg Oral Daily  . desmopressin  0.2 mg Oral QHS  . enoxaparin (LOVENOX) injection  30 mg Subcutaneous Q24H  . feeding supplement  237 mL Oral Q24H  . levothyroxine  112 mcg Oral QAC breakfast  . naproxen  250 mg Oral BID WC  . nicotine  14 mg Transdermal Daily  . pantoprazole  40 mg Oral BID  . propranolol ER  80 mg Oral Daily  . saccharomyces boulardii  250 mg Oral BID  . senna  2 tablet Oral BID  . vancomycin  500 mg Intravenous Q12H   Continuous Infusions: . sodium chloride 75 mL/hr at 12/31/12 0912   PRN Meds:.acetaminophen, acetaminophen, ALPRAZolam, bisacodyl, hydrALAZINE, morphine injection, ondansetron (ZOFRAN) IV, ondansetron, oxyCODONE  Antibiotics: Anti-infectives   Start     Dose/Rate Route Frequency Ordered Stop   12/29/12 2300  vancomycin (VANCOCIN) 500 mg in sodium chloride 0.9 % 100 mL IVPB     500 mg 100 mL/hr over 60 Minutes Intravenous Every 12 hours 12/29/12 2248     12/27/12 2200  vancomycin (VANCOCIN) IVPB 750 mg/150 ml premix  Status:  Discontinued     750 mg 150 mL/hr over 60 Minutes Intravenous Every 24 hours 12/27/12 0113 12/29/12 2251   12/27/12 1400  piperacillin-tazobactam (ZOSYN) IVPB 3.375 g  Status:  Discontinued     3.375 g 12.5 mL/hr over 240 Minutes Intravenous 3  times per day 12/27/12 1120 12/29/12 0823   12/26/12 1945  vancomycin (VANCOCIN) IVPB 1000 mg/200 mL premix     1,000 mg 200 mL/hr over 60 Minutes Intravenous  Once 12/26/12 1940 12/26/12 2208       PHYSICAL EXAM: Vital signs in last 24 hours: Filed Vitals:   12/30/12 0615 12/30/12 1400 12/30/12 2012 12/31/12 0524  BP: 145/86 140/76 145/55 171/74  Pulse: 50 56 51 69  Temp: 97.6 F (36.4 C) 97.9 F (36.6 C) 97.9 F (36.6 C) 97.5 F (36.4 C)  TempSrc:   Oral Oral  Resp: 18 16 16 18   Height:      Weight:      SpO2: 99% 98% 99% 96%    Weight change:  Filed Weights   12/27/12 0029  Weight: 47.219 kg (104 lb 1.6 oz)   Body mass index is 19.04 kg/(m^2).   Gen Exam: Awake and alert with clear speech.   Neck: Supple, No JVD.   Chest: B/L Clear.   CVS: S1 S2 Regular, no murmurs.  Abdomen: soft, BS +, non tender, non distended.  Extremities: no edema, lower extremities warm to touch.Right hand is swollen. Inflammation and swelling of the right fifth digit is also noted. Significant decrease in  Erythema and swelling is noted in the fifth digit as well as the hand dorsally,some erythema/swelling however persists proximally to the wrist joint on the palmer aspect. Range of motion is significantly improved, patient is able to make a fist. Radial pulses are well palpable. Neurologic: Non Focal.   Skin: No Rash.   Wounds: N/A.    Intake/Output from previous day:  Intake/Output Summary (Last 24 hours) at 12/31/12 1019 Last data filed at 12/30/12 2313  Gross per 24 hour  Intake    340 ml  Output      0 ml  Net    340 ml     LAB RESULTS: CBC  Recent Labs Lab 12/26/12 1945 12/27/12 0214 12/28/12 0355 12/29/12 1133 12/30/12 0518  WBC 5.1 4.8 3.4* 2.0* 2.0*  HGB 12.2 11.1* 11.1* 10.0* 10.8*  HCT 35.7* 32.9* 33.3* 28.5* 31.3*  PLT 234 201 190 183 189  MCV 90.6 89.4 89.8 86.1 84.8  MCH 31.0 30.2 29.9 30.2 29.3  MCHC 34.2 33.7 33.3 35.1 34.5  RDW 15.2 15.3 15.3 14.5 14.3   LYMPHSABS 2.5  --   --   --   --   MONOABS 1.2*  --   --   --   --   EOSABS 0.0  --   --   --   --   BASOSABS 0.0  --   --   --   --     Chemistries   Recent Labs Lab 12/27/12 0214 12/28/12 0355 12/29/12 1133 12/30/12 0518 12/31/12 0518  NA 138 131* 125* 126* 129*  K 3.3* 3.9 3.1* 3.2* 4.5  CL 102 96 89* 91* 96  CO2 26 22 22 22 22   GLUCOSE 120* 99 75 73 88  BUN 7 15 9 6 10   CREATININE 0.47* 0.53 0.55 0.42* 0.54  CALCIUM 9.2 9.3 8.4 8.6 9.5    CBG:  Recent Labs Lab 12/27/12 0731 12/27/12 2132 12/28/12 1240 12/29/12 0717  GLUCAP 98 96 102* 87    GFR Estimated Creatinine Clearance: 48.1 ml/min (by C-G formula based on Cr of 0.54).  Coagulation profile No results found for this basename: INR, PROTIME,  in the last 168 hours  Cardiac Enzymes No results found for this basename: CK, CKMB, TROPONINI, MYOGLOBIN,  in the last 168 hours  No components found with this basename: POCBNP,  No results found for this basename: DDIMER,  in the last 72 hours No results found for this basename: HGBA1C,  in the last 72 hours No results found for this basename: CHOL, HDL, LDLCALC, TRIG, CHOLHDL, LDLDIRECT,  in the last 72 hours No results found for this basename: TSH, T4TOTAL, FREET3, T3FREE, THYROIDAB,  in the last 72 hours No results found for this basename: VITAMINB12, FOLATE, FERRITIN, TIBC, IRON, RETICCTPCT,  in the last 72 hours No results found for this basename: LIPASE, AMYLASE,  in the last 72 hours  Urine Studies No results found for this basename: UACOL, UAPR, USPG, UPH, UTP, UGL, UKET, UBIL, UHGB, UNIT, UROB, ULEU, UEPI, UWBC, URBC, UBAC, CAST, CRYS, UCOM, BILUA,  in the last 72 hours  MICROBIOLOGY: Recent Results (from the past 240 hour(s))  CULTURE, BLOOD (ROUTINE X 2)     Status: None   Collection Time    12/26/12  7:39 PM      Result Value Range Status   Specimen Description BLOOD LEFT ANTECUBITAL   Final   Special Requests BOTTLES DRAWN AEROBIC AND  ANAEROBIC 4CC   Final   Culture  Setup Time 12/29/2012 14:36   Final   Culture     Final   Value: GRAM POSITIVE COCCI IN CLUSTERS     Note: Gram Stain Report Called to,Read Back By and Verified With: SHARON [facility] 12/29/12 0600 RESSEGGER R  Performed at [facility]   Report Status PENDING   Incomplete  CULTURE, BLOOD (ROUTINE X 2)     Status: None   Collection Time    12/26/12  7:48 PM      Result Value Range Status   Specimen Description BLOOD LEFT HAND   Final   Special Requests BOTTLES DRAWN AEROBIC AND ANAEROBIC 4CC   Final   Culture NO GROWTH 4 DAYS   Final   Report Status PENDING   Incomplete  CLOSTRIDIUM DIFFICILE BY PCR     Status: None   Collection Time    12/29/12 10:31 PM      Result Value Range Status   C difficile by pcr NEGATIVE  NEGATIVE Final    RADIOLOGY STUDIES/RESULTS: Dg Clavicle Left  12/26/2012   *RADIOLOGY REPORT*  Clinical Data: Painful swollen sternoclavicular joint and shoulder pain  LEFT CLAVICLE - 2+ VIEWS  Comparison: Chest radiograph 10/22/2012  Findings: There is mild degenerative change at the acromioclavicular joint.  Artifact overlies the distal clavicle on one view.  There is no clavicular destruction or fracture.  Mild left glenohumeral joint degenerative change.  IMPRESSION: No acute osseous abnormality of the left clavicle.  If there is strong clinical suspicion for sternoclavicular osteomyelitis or other radiographically occult finding, consider CT of the upper chest with contrast for further evaluation (contrast should be injected in the contralateral right side).   Original Report Authenticated By: Christiana Pellant, M.D.   Dg Clavicle Right  12/26/2012   *RADIOLOGY REPORT*  Clinical Data: Pain and swelling of the sternoclavicular joint  RIGHT CLAVICLE - 2+ VIEWS  Comparison: The chest radiograph 10/22/2012  Findings: No clavicular fracture identified.  Mild right AC joint degenerative change.  Right lung apex is clear.  IMPRESSION: No acute  abnormality.   Original Report Authenticated By: Christiana Pellant, M.D.   Dg Hand Complete Right  12/26/2012   *RADIOLOGY REPORT*  Clinical Data: Right hand pain and swelling and  RIGHT HAND - COMPLETE 3+ VIEW  Comparison: None.  Findings: There is no evidence of fracture or dislocation. Significant degenerative change is noted at the first carpometacarpal joint, with mild surrounding bone formation and joint space loss.  Remaining visualized joint spaces are grossly preserved.  The carpal rows are grossly intact, and demonstrate normal alignment.  No significant soft tissue abnormalities are characterized on radiograph.  IMPRESSION:  1.  No evidence of fracture or dislocation. 2.  Significant osteoarthritis at the first carpometacarpal joint.   Original Report Authenticated By: Tonia Ghent, M.D.    Leroy Sea, MD  [facility] Pager:[phone]  If 7PM-7AM, please contact night-coverage [email or web address] Password [email or web address] 12/31/2012, 10:19 AM   LOS: 5 days

## 2012-12-31 NOTE — Progress Notes (Signed)
ANTIBIOTIC CONSULT NOTE - FOLLOW UP  Pharmacy Consult for Vancomycin Indication: right hand cellulitis, positive blood culture  Allergies  Allergen Reactions  . Codeine     REACTION: NAUSEA  . Tetanus Toxoid     REACTION: ARM SWELLING,REDNESS    Patient Measurements: Height: 5\' 2"  (157.5 cm) Weight: 104 lb 1.6 oz (47.219 kg) IBW/kg (Calculated) : 50.1  Vital Signs: Temp: 97.5 F (36.4 C) (05/29 0524) Temp src: Oral (05/29 0524) BP: 171/74 mmHg (05/29 0524) Pulse Rate: 69 (05/29 0524) Intake/Output from previous day: 05/28 0701 - 05/29 0700 In: 340 [P.O.:240; IV Piggyback:100] Out: -  Intake/Output from this shift:    Labs:  Recent Labs  12/29/12 1133 12/30/12 0518 12/31/12 0518  WBC 2.0* 2.0*  --   HGB 10.0* 10.8*  --   PLT 183 189  --   CREATININE 0.55 0.42* 0.54   Estimated Creatinine Clearance: 48.1 ml/min (by C-G formula based on Cr of 0.54).  Recent Labs  12/29/12 2106 12/31/12 0903  VANCOTROUGH <5.0* 5.7*     Microbiology: Recent Results (from the past 720 hour(s))  CULTURE, BLOOD (ROUTINE X 2)     Status: None   Collection Time    12/26/12  7:39 PM      Result Value Range Status   Specimen Description BLOOD LEFT ANTECUBITAL   Final   Special Requests BOTTLES DRAWN AEROBIC AND ANAEROBIC 4CC   Final   Culture  Setup Time 12/29/2012 14:36   Final   Culture     Final   Value: GRAM POSITIVE COCCI IN CLUSTERS     Note: Gram Stain Report Called to,Read Back By and Verified With: SHARON [facility] 12/29/12 0600 RESSEGGER R  Performed at [facility]   Report Status PENDING   Incomplete  CULTURE, BLOOD (ROUTINE X 2)     Status: None   Collection Time    12/26/12  7:48 PM      Result Value Range Status   Specimen Description BLOOD LEFT HAND   Final   Special Requests BOTTLES DRAWN AEROBIC AND ANAEROBIC 4CC   Final   Culture NO GROWTH 5 DAYS   Final   Report Status 12/31/2012 FINAL   Final  CLOSTRIDIUM DIFFICILE BY PCR     Status: None   Collection Time    12/29/12 10:31 PM      Result Value Range Status   C difficile by pcr NEGATIVE  NEGATIVE Final  BODY FLUID CULTURE     Status: None   Collection Time    12/30/12  6:25 PM      Result Value Range Status   Specimen Description SYNOVIAL RIGHT WRIST   Final   Special Requests NONE   Final   Gram Stain     Final   Value: ABUNDANT WBC PRESENT, PREDOMINANTLY PMN     NO ORGANISMS SEEN   Culture PENDING   Incomplete   Report Status PENDING   Incomplete    Anti-infectives   Start     Dose/Rate Route Frequency Ordered Stop   12/29/12 2300  vancomycin (VANCOCIN) 500 mg in sodium chloride 0.9 % 100 mL IVPB     500 mg 100 mL/hr over 60 Minutes Intravenous Every 12 hours 12/29/12 2248     12/27/12 2200  vancomycin (VANCOCIN) IVPB 750 mg/150 ml premix  Status:  Discontinued     750 mg 150 mL/hr over 60 Minutes Intravenous Every 24 hours 12/27/12 0113 12/29/12 2251   12/27/12 1400  piperacillin-tazobactam (  ZOSYN) IVPB 3.375 g  Status:  Discontinued     3.375 g 12.5 mL/hr over 240 Minutes Intravenous 3 times per day 12/27/12 1120 12/29/12 0823   12/26/12 1945  vancomycin (VANCOCIN) IVPB 1000 mg/200 mL premix     1,000 mg 200 mL/hr over 60 Minutes Intravenous  Once 12/26/12 1940 12/26/12 2208      Assessment: 72 year old female on Day #6 of Vancomycin for cellulitis and positive blood culture.  Her Vancomycin trough remains subtherapeutic.   Goal of Therapy:  Vancomycin trough level 10-15 mcg/ml  Plan:  Increase Vancomycin to 750mg  IV q12h Await blood culture results  Estella Husk, Pharm.D., BCPS Clinical Pharmacist  Phone [phone] or [phone] Pager [phone] 12/31/2012, 11:53 AM

## 2013-01-01 DIAGNOSIS — R079 Chest pain, unspecified: Secondary | ICD-10-CM

## 2013-01-01 DIAGNOSIS — I498 Other specified cardiac arrhythmias: Secondary | ICD-10-CM

## 2013-01-01 DIAGNOSIS — I369 Nonrheumatic tricuspid valve disorder, unspecified: Secondary | ICD-10-CM

## 2013-01-01 DIAGNOSIS — E039 Hypothyroidism, unspecified: Secondary | ICD-10-CM

## 2013-01-01 LAB — CULTURE, BLOOD (ROUTINE X 2)

## 2013-01-01 LAB — MRSA PCR SCREENING: MRSA by PCR: NEGATIVE

## 2013-01-01 LAB — CBC WITH DIFFERENTIAL/PLATELET
Eosinophils Relative: 1 % (ref 0–5)
HCT: 34.8 % — ABNORMAL LOW (ref 36.0–46.0)
Lymphs Abs: 1 10*3/uL (ref 0.7–4.0)
MCV: 88.5 fL (ref 78.0–100.0)
Monocytes Relative: 20 % — ABNORMAL HIGH (ref 3–12)
Neutro Abs: 0.5 10*3/uL — ABNORMAL LOW (ref 1.7–7.7)
RBC: 3.93 MIL/uL (ref 3.87–5.11)
WBC: 1.9 10*3/uL — ABNORMAL LOW (ref 4.0–10.5)

## 2013-01-01 LAB — BASIC METABOLIC PANEL
CO2: 28 mEq/L (ref 19–32)
Chloride: 95 mEq/L — ABNORMAL LOW (ref 96–112)
Creatinine, Ser: 0.55 mg/dL (ref 0.50–1.10)

## 2013-01-01 LAB — GLUCOSE, CAPILLARY

## 2013-01-01 LAB — TROPONIN I: Troponin I: <0.3 ng/mL (ref <0.30)

## 2013-01-01 LAB — PATHOLOGIST SMEAR REVIEW

## 2013-01-01 MED ORDER — HYDRALAZINE HCL 20 MG/ML IJ SOLN: 5.0000 mg | Freq: Four times a day (QID) | INTRAMUSCULAR | Status: DC | PRN

## 2013-01-01 MED ORDER — ASPIRIN 325 MG PO TABS
325.0000 mg | ORAL_TABLET | Freq: Once | ORAL | Status: AC
  Administered 2013-01-01: 325 mg via ORAL
  Filled 2013-01-01: qty 1

## 2013-01-01 MED ORDER — AMLODIPINE BESYLATE 10 MG PO TABS
10.0000 mg | ORAL_TABLET | Freq: Every day | ORAL | Status: DC
  Administered 2013-01-01 – 2013-01-04 (×4): 10 mg via ORAL
  Filled 2013-01-01 (×5): qty 1

## 2013-01-01 MED ORDER — HYDRALAZINE HCL 10 MG PO TABS
10.0000 mg | ORAL_TABLET | Freq: Three times a day (TID) | ORAL | Status: DC
  Administered 2013-01-01 – 2013-01-02 (×3): 10 mg via ORAL
  Filled 2013-01-01 (×7): qty 1

## 2013-01-01 NOTE — Progress Notes (Signed)
Physical Therapy Treatment Patient Details Name: Caroline Nichols MRN: [id number] DOB: [birth year] Today's Date: 01/01/2013 Time: 0865-7846 PT Time Calculation (min): 15 min  PT Assessment / Plan / Recommendation Comments on Treatment Session  Pt able to increase ambulation distance and overall improvement with mobility.  Needs increase assistance with commode.    Follow Up Recommendations  Supervision/Assistance - 24 hour;SNF     Equipment Recommendations  None recommended by PT    Frequency Min 3X/week   Plan Discharge plan remains appropriate;Frequency needs to be updated    Precautions / Restrictions Precautions Precautions: Fall Restrictions Weight Bearing Restrictions: No   Pertinent Vitals/Pain No c/o pain; vitals maintained    Mobility  Bed Mobility Bed Mobility:  (Pt sitting EOB when entering) Sit to Supine: 6: Modified independent (Device/Increase time) Transfers Transfers: Sit to Stand;Stand to Sit Sit to Stand: 4: Min assist;From bed;From toilet Stand to Sit: 4: Min assist;To bed;To toilet Details for Transfer Assistance: (A) to initiate transfer and slowly descend from toliet seat with max cues for hand placement.  Pt continues to keep hands on RW after multiple cues. Ambulation/Gait Ambulation/Gait Assistance: 4: Min guard Ambulation Distance (Feet): 100 Feet Assistive device: Right platform walker Ambulation/Gait Assistance Details: Pt able to increase ambulation distance and moving well with platform RW however may attempt to advance to [facility] next session.  Pt unwilling due to LE weakness this session Gait Pattern: Step-through pattern;Narrow base of support Gait velocity: slow cautious gait Stairs: No Wheelchair Mobility Wheelchair Mobility: No    Exercises     PT Diagnosis:    PT Problem List:   PT Treatment Interventions:     PT Goals Acute Rehab PT Goals PT Goal Formulation: With patient Time For Goal Achievement: 01/12/13 Potential to Achieve  Goals: Good Pt will go Sit to Stand: with modified independence PT Goal: Sit to Stand - Progress: Progressing toward goal Pt will go Stand to Sit: with modified independence PT Goal: Stand to Sit - Progress: Progressing toward goal Pt will Ambulate: 51 - 150 feet;with modified independence;with least restrictive assistive device PT Goal: Ambulate - Progress: Progressing toward goal  Visit Information  Last PT Received On: 01/01/13 Assistance Needed: +1    Subjective Data  Subjective: I need to use the bathroom. Patient Stated Goal: To go home with dtr   Cognition  Cognition Arousal/Alertness: Awake/alert Behavior During Therapy: WFL for tasks assessed/performed Overall Cognitive Status: Within Functional Limits for tasks assessed    Balance     End of Session PT - End of Session Equipment Utilized During Treatment: Gait belt Activity Tolerance: Patient tolerated treatment well Patient left: in bed;with family/visitor present;Other (comment) (With [place] tech present for procedure) Nurse Communication: Mobility status   GP     Omolola Mittman 01/01/2013, 10:56 AM Jake Shark, PT DPT [phone]

## 2013-01-01 NOTE — Progress Notes (Signed)
NUTRITION FOLLOW UP  Intervention:   1.  Supplements; continue Glucerna.  Increased to BID. 2.  Modify diet; requested food service allow pt to order foods as desire keeping Gluten restriction in mind.  Pt does not follow Gluten-free diet strictly at home and is capable of making choices r/t food.  RD discussed with food service staff 3.  RD to follow care plan.   Nutrition Dx:   Inadequate oral intake, ongoing.  Monitor:   1.  Food/Beverage; improvement in intake with increased choice and diet variability. 2.  Wt/wt change; deter loss  Assessment:   Admitted with wrist pain and swelling, chills, loss of appetite. Smokes daily. Work-up reveals possible RA. Ortho saw pt 5/25 and recommended no surgical interventions at this time.    Pt with poor appetite and wt loss x last few years. Pt reports several medical issues with several medical providers involved in care. Pt has received nutrition-related advice from all providers- GI specialists, endocrinologists, PCPs and has attempted to integrate all recommendations into a diet plan for herself. Pt only consumes whole grains, avoids products with >5g sugar (including some fruits and vegetables), and eats small portions.  Pt states she checks her blood sugars BID and has had reading up to 269 mg/dL.  Pt reports she has taken GTT in the past (last one >2 years ago) and passed.  Note pt's glucose typically runs wnl in AM and PM. She reports 20 lbs wt loss and was found to meet criteria for severe malnutrition of chronic illness based on body composition which showed severe fat and muscle loss. Pt would greatly benefit from outpatient education/counseling regarding improving nutrition status with delicate nutrition/GI-related needs.     Height: Ht Readings from Last 1 Encounters:  12/27/12 5\' 2"  (1.575 m)    Weight Status:   Wt Readings from Last 1 Encounters:  01/01/13 104 lb 0.9 oz (47.2 kg)    Re-estimated needs:  Kcal:  1410-1645 Protein: 55-65g Fluid: >1.4 L/day  Skin: non-pitting edema  Diet Order: Gluten Restricted   Intake/Output Summary (Last 24 hours) at 01/01/13 1159 Last data filed at 01/01/13 0300  Gross per 24 hour  Intake   1725 ml  Output      0 ml  Net   1725 ml    Last BM: 5/28   Labs:   Recent Labs Lab 12/30/12 0518 12/31/12 0518 01/01/13 0111  NA 126* 129* 133*  K 3.2* 4.5 3.6  CL 91* 96 95*  CO2 22 22 28   BUN 6 10 12   CREATININE 0.42* 0.54 0.55  CALCIUM 8.6 9.5 9.4  GLUCOSE 73 88 88    CBG (last 3)   Recent Labs  12/31/12 1105  GLUCAP 85    Scheduled Meds: . amLODipine  10 mg Oral Daily  . aspirin EC  81 mg Oral Daily  . desmopressin  0.2 mg Oral QHS  . enoxaparin (LOVENOX) injection  30 mg Subcutaneous Q24H  . feeding supplement  237 mL Oral Q24H  . levothyroxine  125 mcg Oral QAC breakfast  . naproxen  250 mg Oral BID WC  . nicotine  14 mg Transdermal Daily  . pantoprazole  40 mg Oral BID  . saccharomyces boulardii  250 mg Oral BID  . senna  2 tablet Oral BID  . vancomycin  750 mg Intravenous Q12H    Continuous Infusions:   Loyce Dys, MS RD LDN Clinical Inpatient Dietitian Pager: [phone] Weekend/After hours pager: [phone]

## 2013-01-01 NOTE — Progress Notes (Signed)
Pt transferred to 2300, rm 16. Report given to [facility] RN.

## 2013-01-01 NOTE — Progress Notes (Signed)
Pt stated she was having left chest and left arm discomfort,  skin warm and dry, sats 97% RA. BP 184/72, no shortness of breath. Pt put on 2 liters o2, and EKG done.  PA [place] advised, order for nitro x  3 if needed.

## 2013-01-01 NOTE — Progress Notes (Signed)
Pt received from transport team.  Telemetry shows sinus rhythm at a rate of 45-65 bts/min.  No ectopy noted and the patient complains of a reproducible pain under the left breast that increases with inspiration.  BP is 174/84 and O2 saturation is 100% on room air.  Oriented to room, plan of care and educated on her fall risk along with precautions.  Verbalizes understanding to call for help when getting out of bed.  Will continue to monitor for bradycardia that sustains below 40 bts/min.

## 2013-01-01 NOTE — Progress Notes (Signed)
No relief in chest discomfort after 3 nitros,  expresses relief in arm discomfort. BP 102/60. Sats 100% on 2 liters. Pt having more frequent brady episodes down to 35 lasting about 10 secs. PA [place] enroute.

## 2013-01-01 NOTE — Progress Notes (Signed)
Pt examined and chart reviewed. Around 10:15pm the patient started complaining of mild 3/10 left sided chest pain with radiation to her left shoulder. VSS, BP elevated in the 160s at that time. SL nitro x 3 provided some relief but not completely. Also, the patient began experiencing periods of bradycardia, at times dropping as low as 29 briefly. This began at 1900. She has had 3 bradys since then. She denied dizziness, dyspnea, but did admit to feeling "tired". Current HR sustaining in the 40s, BP remains in the 160s systolic. She is awake, alert, and oriented. NAD. Lungs CTA, heart bradycardic with 2/6 SEM heard best at right upper sternal border. Abd soft, NT. No LE edema. EKG shows sinus brady with first degree AV block. Troponin at 2000 normal. Plan at this time is to transfer patient to SDU, use PRN atropine as currently ordered. Pt has been on propranolol at home but it does not appear that she has received it since her admission. TSH and T4 pending although earlier this week T4 was normal. Discussed with Dr. Tresa Endo, cardiology. Will give a dose of ASA and he will evaluate the patient and give further recommendations. ECHO scheduled for AM.

## 2013-01-01 NOTE — Progress Notes (Signed)
Patient: Caroline Nichols Date of Encounter: 01/01/2013, 8:25 AM Admit date: 12/26/2012     Subjective  Ms. Caroline Nichols report constant chest pain under left breast. No change with respiration but intensifies with palpation of left chest. She denies SOB, dizziness or syncope.   Objective  Physical Exam: Vitals: BP 167/86  Pulse 64  Temp(Src) 98.3 F (36.8 C) (Oral)  Resp 16  Ht 5\' 2"  (1.575 m)  Wt 104 lb 0.9 oz (47.2 kg)  BMI 19.03 kg/m2  SpO2 99% General: Thin 72 year old female in no acute distress. Neck: Supple. JVD not elevated. Tenderness and erythema right clavicular area. Lungs: Clear bilaterally to auscultation without wheezes, rales, or rhonchi. Breathing is unlabored. Heart: RRR S1 S2 without murmur, rub or gallop. Chest pain reproducible on palpation of left chest. Abdomen: Soft, non-distended. Tender to palpation. Extremities: No clubbing or cyanosis. No edema.  Distal pedal pulses are 2+ and equal bilaterally. Neuro: Alert and oriented X 3. Moves all extremities spontaneously. No focal deficits.  Intake/Output:  Intake/Output Summary (Last 24 hours) at 01/01/13 0825 Last data filed at 01/01/13 0300  Gross per 24 hour  Intake   1725 ml  Output      0 ml  Net   1725 ml    Inpatient Medications:  . amLODipine  10 mg Oral Daily  . aspirin EC  81 mg Oral Daily  . desmopressin  0.2 mg Oral QHS  . enoxaparin (LOVENOX) injection  30 mg Subcutaneous Q24H  . feeding supplement  237 mL Oral Q24H  . levothyroxine  125 mcg Oral QAC breakfast  . naproxen  250 mg Oral BID WC  . nicotine  14 mg Transdermal Daily  . pantoprazole  40 mg Oral BID  . saccharomyces boulardii  250 mg Oral BID  . senna  2 tablet Oral BID  . vancomycin  750 mg Intravenous Q12H   . sodium chloride 75 mL/hr at 01/01/13 0300    Labs:  Recent Labs  12/31/12 0518 01/01/13 0111  NA 129* 133*  K 4.5 3.6  CL 96 95*  CO2 22 28  GLUCOSE 88 88  BUN 10 12  CREATININE 0.54 0.55  CALCIUM 9.5 9.4      Recent Labs  12/30/12 0518 01/01/13 0111  WBC 2.0* 1.9*  NEUTROABS  --  0.5*  HGB 10.8* 11.8*  HCT 31.3* 34.8*  MCV 84.8 88.5  PLT 189 220    Recent Labs  12/31/12 1920 01/01/13 0111  TROPONINI <0.30 <0.30    Radiology/Studies: Ct Chest W Contrast  12/27/2012   *RADIOLOGY REPORT*  Clinical Data: Pain and inflammation in the region of the sternoclavicular joints.  Clinical concern for osteomyelitis.  CT CHEST WITH CONTRAST  Technique:  Multidetector CT imaging of the chest was performed following the standard protocol during bolus administration of intravenous contrast.  Contrast: 80mL OMNIPAQUE IOHEXOL 300 MG/ML  SOLN  Comparison: Bilateral clavicle radiographs obtained yesterday.  Findings: Subarticular cyst formation and bony irregularity on the manubrial side of the right sternoclavicular joint.  These changes are well corticated.  Mild right inferior clavicle spur formation. Normal appearing left sternoclavicular joint.  No abnormal fluid collections.  Thoracic spine degenerative changes.  Mild bullous changes in both lungs, most pronounced in the upper lobes.  No lung masses or enlarged lymph nodes.  Unremarkable upper abdomen.  IMPRESSION:  1.  Right sternoclavicular osteoarthritis. 2.  No evidence of osteomyelitis. 3.  COPD with centrilobular and paraseptal emphysema.  Original Report Authenticated By: Beckie Salts, M.D.   Dg Chest Port 1 View  12/28/2012   *RADIOLOGY REPORT*  Clinical Data: Shortness of breath.  PORTABLE CHEST - 1 VIEW  Comparison: 10/22/2012.  Findings: The heart is borderline enlarged but stable.  Mild stable emphysematous changes and upper lobe scarring.  No infiltrates, edema or effusions.  IMPRESSION:  1.  Chronic lung changes but no acute pulmonary findings. 2.  Stable borderline cardiac enlargement.   Original Report Authenticated By: Rudie Meyer, M.D.    Echocardiogram: pending Telemetry: SR in 60s currently 12-lead ECG: last night shows SR with no  acute ST-T wave abnormalities; this AM pending  Cardiac cath April 2004 FINDINGS:  1. Left Main Trunk: Large-caliber vessel with mild irregularities of 15% in  the mid section.  2. Left Anterior Descending: This begins as a large-caliber vessel and  tapers as it wraps around the apex. It provides several small diagonal  branches in the mid section. The LAD has mild disease of 20% in the  proximal segment. There is mild diffuse disease of 20% in the mid and  distal sections. Corkscrewing of the vessel is noted distally consistent  with hypertrophic heart disease.  3. Left Circumflex Artery: This is a medium-caliber vessel and provides a  large first marginal branch and proximal segment, 2 smaller marginal  branches distally. The left circumflex system is dominant. There is  diffuse narrowings of 30% in the AV circumflex as well as mild disease in  the marginal branches.  4. Right Coronary Artery: The right coronary artery is nondominant. It is  a small-caliber vessel that provides 2 RV marginal branches. There are  mild irregularities in the right coronary artery.  5. Left Ventriculogram: Normal end-systolic and end-diastolic dimensions.  Overall, left ventricular function is well preserved. Ejection fraction  is greater than 55%. No mitral regurgitation. LV pressure is 140/zero,  aortic is 140/70. LVEDP equals 15.  ASSESSMENT AND PLAN: The patient is a 72 year old female with noncritical  coronary artery disease and well-preserved left ventricular function.  Continue medical therapy and aggressive risk factor modification will be  pursued.    Assessment and Plan  Ms. Caroline Nichols is a 72 yo woman with hypertension, hypothyroidism, tobacco abuse, arthritis, chronic abdominal pain, DI on ddavp here being treated for arthritis/tenosynovitis/probable RA who had bradycardia and some chest/upper abdominal discomfort.   1. Chest pain  - reproducible on palpation of chest  - CEs negative  - echo  pending  - h/o nonobstructive CAD by cath 2004; previously followed by Dr. Andee Lineman; last seen by Dr. Antoine Poche in 2011 2. Bradycardia  - minimally symptomatic  - no evidence of significant AV block or pause  - BB now discontinued  Dr. Ladona Ridgel to see Signed, Rick Duff PA-C  EP Attending  Agree with above exam, assessment and plan.  Leonia Reeves.D.

## 2013-01-01 NOTE — Progress Notes (Signed)
  Echocardiogram 2D Echocardiogram has been performed.  Cathie Beams 01/01/2013, 10:48 AM

## 2013-01-01 NOTE — Progress Notes (Signed)
PA Harduk assessing pt, BP 169/723, same discomfort in left chest area, rated 2-3. PA advised  Pt would be moving pt to step-down unit.

## 2013-01-01 NOTE — Consult Note (Addendum)
Reason for Consult: Bradycardia with some chest tenderness Referring Physician: L. Harduk  Caroline Nichols is an 72 y.o. female.  HPI: Caroline Nichols is a 72 yo woman with PMH of arthritis, possible RA, hypertension, chronic abdominal pain, hypothryoidism, anxiety, DI on ddavp initially admitted with pain/swelling of her right hand and sternoclavicular joint transferred from OSH and seen by Dr. Izora Ribas, hand surgery, with current therapy on anti-inflammatories and antibiotics while some investigation ongoing. She has an elevated RA but the complete rheumatologic panel is not resulted. Cardiology consulted given some transient brief periods of bradycardia and when patient further questioned, she mentioned she had some left upper abdominal/diaphragmatic chest tenderness. She tells me she initially thought the pain was like prior episodes of reflux except the current pain is more leftward, less central and slightly different character. She says the pain has been as bad as 4/10 and currently 2/10. She did have some mild relief with sublingual nitroglycerin. The pain is reproducible with palpation and localizes to the 4th/5th intercostal rib space in the midclavicular line. She has some associated weakness with the bradycardia but no lightheadedness/pre-syncope or syncope. She tells me both her parents died of cardiovascular disease in their 8s. She is a current smoker - recently down to 3 cigarettes daily now on nicotine patch and motivated to quit. She also tells me she hasn't slept the entire time she's at the hosiptal and just wants to get some rest.     Past Medical History  Diagnosis Date  . Hypertension   . Thyroid disease     hyper  . IBS (irritable bowel syndrome)   . Herniated disc   . Pancreatic cyst   . Arthritis   . Hyperthyroidism     Past Surgical History  Procedure Laterality Date  . Tubal ligation    . Cervical spine surgery    . Hammer toe surgery    . Other surgical history     partial hysterectomy  . Hernia repair    . Abdominal hysterectomy      Family history: mother/father both died of cardiovascular diseases in their 19s  Social History:  reports that she has been smoking Cigarettes.  She has been smoking about 0.50 packs per day. She does not have any smokeless tobacco history on file. She reports that she does not drink alcohol or use illicit drugs.  Allergies:  Allergies  Allergen Reactions  . Codeine     REACTION: NAUSEA  . Tetanus Toxoid     REACTION: ARM SWELLING,REDNESS    Medications:  I have reviewed the patient's current medications. Prior to Admission:  Prescriptions prior to admission  Medication Sig Dispense Refill  . ALPRAZolam (XANAX) 0.5 MG tablet Take 0.5 mg by mouth 3 (three) times daily as needed for anxiety.       Marland Kitchen aspirin EC 81 MG tablet Take 81 mg by mouth daily.      . Calcium-Vitamin D (SUPER CALCIUM/D) 600-125 MG-UNIT TABS Take by mouth. Taking 2 daily      . desmopressin (DDAVP) 0.2 MG tablet Take 0.2 mg by mouth at bedtime.      Marland Kitchen levothyroxine (SYNTHROID, LEVOTHROID) 112 MCG tablet Take 112 mcg by mouth daily before breakfast.      . Omega-3 Fatty Acids (FISH OIL) 1000 MG CAPS Take by mouth daily.      . potassium chloride (K-DUR,KLOR-CON) 10 MEQ tablet Take 40 mEq by mouth 2 (two) times daily.      . propranolol ER (  INDERAL LA) 80 MG 24 hr capsule Take 80 mg by mouth daily.       Scheduled: . aspirin EC  81 mg Oral Daily  . desmopressin  0.2 mg Oral QHS  . enoxaparin (LOVENOX) injection  30 mg Subcutaneous Q24H  . feeding supplement  237 mL Oral Q24H  . levothyroxine  125 mcg Oral QAC breakfast  . naproxen  250 mg Oral BID WC  . nicotine  14 mg Transdermal Daily  . nitroGLYCERIN      . pantoprazole  40 mg Oral BID  . saccharomyces boulardii  250 mg Oral BID  . senna  2 tablet Oral BID  . vancomycin  750 mg Intravenous Q12H    Results for orders placed during the hospital encounter of 12/26/12 (from the past 48  hour(s))  CBC     Status: Abnormal   Collection Time    12/30/12  5:18 AM      Result Value Range   WBC 2.0 (*) 4.0 - 10.5 K/uL   RBC 3.69 (*) 3.87 - 5.11 MIL/uL   Hemoglobin 10.8 (*) 12.0 - 15.0 g/dL   HCT 40.9 (*) 81.1 - 91.4 %   MCV 84.8  78.0 - 100.0 fL   MCH 29.3  26.0 - 34.0 pg   MCHC 34.5  30.0 - 36.0 g/dL   RDW 78.2  95.6 - 21.3 %   Platelets 189  150 - 400 K/uL  BASIC METABOLIC PANEL     Status: Abnormal   Collection Time    12/30/12  5:18 AM      Result Value Range   Sodium 126 (*) 135 - 145 mEq/L   Potassium 3.2 (*) 3.5 - 5.1 mEq/L   Chloride 91 (*) 96 - 112 mEq/L   CO2 22  19 - 32 mEq/L   Glucose, Bld 73  70 - 99 mg/dL   BUN 6  6 - 23 mg/dL   Creatinine, Ser 0.86 (*) 0.50 - 1.10 mg/dL   Calcium 8.6  8.4 - 57.8 mg/dL   GFR calc non Af Amer >90  >90 mL/min   GFR calc Af Amer >90  >90 mL/min   Comment:            The eGFR has been calculated     using the CKD EPI equation.     This calculation has not been     validated in all clinical     situations.     eGFR's persistently     <90 mL/min signify     possible Chronic Kidney Disease.  BODY FLUID CULTURE     Status: None   Collection Time    12/30/12  6:25 PM      Result Value Range   Specimen Description SYNOVIAL RIGHT WRIST     Special Requests NONE     Gram Stain       Value: ABUNDANT WBC PRESENT, PREDOMINANTLY PMN     NO ORGANISMS SEEN   Culture PENDING     Report Status PENDING    BASIC METABOLIC PANEL     Status: Abnormal   Collection Time    12/31/12  5:18 AM      Result Value Range   Sodium 129 (*) 135 - 145 mEq/L   Potassium 4.5  3.5 - 5.1 mEq/L   Chloride 96  96 - 112 mEq/L   CO2 22  19 - 32 mEq/L   Glucose, Bld 88  70 - 99 mg/dL  BUN 10  6 - 23 mg/dL   Creatinine, Ser 1.61  0.50 - 1.10 mg/dL   Calcium 9.5  8.4 - 09.6 mg/dL   GFR calc non Af Amer >90  >90 mL/min   GFR calc Af Amer >90  >90 mL/min   Comment:            The eGFR has been calculated     using the CKD EPI equation.      This calculation has not been     validated in all clinical     situations.     eGFR's persistently     <90 mL/min signify     possible Chronic Kidney Disease.  VANCOMYCIN, TROUGH     Status: Abnormal   Collection Time    12/31/12  9:03 AM      Result Value Range   Vancomycin Tr 5.7 (*) 10.0 - 20.0 ug/mL  GLUCOSE, CAPILLARY     Status: None   Collection Time    12/31/12 11:05 AM      Result Value Range   Glucose-Capillary 85  70 - 99 mg/dL  TROPONIN I     Status: None   Collection Time    12/31/12  7:20 PM      Result Value Range   Troponin I <0.30  <0.30 ng/mL   Comment:            Due to the release kinetics of cTnI,     a negative result within the first hours     of the onset of symptoms does not rule out     myocardial infarction with certainty.     If myocardial infarction is still suspected,     repeat the test at appropriate intervals.  TSH     Status: Abnormal   Collection Time    12/31/12  7:50 PM      Result Value Range   TSH 12.303 (*) 0.350 - 4.500 uIU/mL  T3, FREE     Status: Abnormal   Collection Time    12/31/12  7:50 PM      Result Value Range   T3, Free 2.0 (*) 2.3 - 4.2 pg/mL  T4     Status: None   Collection Time    12/31/12  7:50 PM      Result Value Range   T4, Total 11.6  5.0 - 12.5 ug/dL  BASIC METABOLIC PANEL     Status: Abnormal   Collection Time    01/01/13  1:11 AM      Result Value Range   Sodium 133 (*) 135 - 145 mEq/L   Potassium 3.6  3.5 - 5.1 mEq/L   Comment: DELTA CHECK NOTED   Chloride 95 (*) 96 - 112 mEq/L   CO2 28  19 - 32 mEq/L   Glucose, Bld 88  70 - 99 mg/dL   BUN 12  6 - 23 mg/dL   Creatinine, Ser 0.45  0.50 - 1.10 mg/dL   Calcium 9.4  8.4 - 40.9 mg/dL   GFR calc non Af Amer >90  >90 mL/min   GFR calc Af Amer >90  >90 mL/min   Comment:            The eGFR has been calculated     using the CKD EPI equation.     This calculation has not been     validated in all clinical     situations.     eGFR's persistently      <  90 mL/min signify     possible Chronic Kidney Disease.  CBC WITH DIFFERENTIAL     Status: Abnormal (Preliminary result)   Collection Time    01/01/13  1:11 AM      Result Value Range   WBC 1.9 (*) 4.0 - 10.5 K/uL   RBC 3.93  3.87 - 5.11 MIL/uL   Hemoglobin 11.8 (*) 12.0 - 15.0 g/dL   HCT 16.1 (*) 09.6 - 04.5 %   MCV 88.5  78.0 - 100.0 fL   MCH 30.0  26.0 - 34.0 pg   MCHC 33.9  30.0 - 36.0 g/dL   RDW 40.9  81.1 - 91.4 %   Platelets 220  150 - 400 K/uL   Neutrophils Relative % PENDING  43 - 77 %   Neutro Abs PENDING  1.7 - 7.7 K/uL   Band Neutrophils PENDING  0 - 10 %   Lymphocytes Relative PENDING  12 - 46 %   Lymphs Abs PENDING  0.7 - 4.0 K/uL   Monocytes Relative PENDING  3 - 12 %   Monocytes Absolute PENDING  0.1 - 1.0 K/uL   Eosinophils Relative PENDING  0 - 5 %   Eosinophils Absolute PENDING  0.0 - 0.7 K/uL   Basophils Relative PENDING  0 - 1 %   Basophils Absolute PENDING  0.0 - 0.1 K/uL   WBC Morphology PENDING     RBC Morphology PENDING     Smear Review PENDING     nRBC PENDING  0 /100 WBC   Metamyelocytes Relative PENDING     Myelocytes PENDING     Promyelocytes Absolute PENDING     Blasts PENDING    PRO B NATRIURETIC PEPTIDE     Status: Abnormal   Collection Time    01/01/13  1:11 AM      Result Value Range   Pro B Natriuretic peptide (BNP) 582.3 (*) 0 - 125 pg/mL  TROPONIN I     Status: None   Collection Time    01/01/13  1:11 AM      Result Value Range   Troponin I <0.30  <0.30 ng/mL   Comment:            Due to the release kinetics of cTnI,     a negative result within the first hours     of the onset of symptoms does not rule out     myocardial infarction with certainty.     If myocardial infarction is still suspected,     repeat the test at appropriate intervals.    Dg Wrist Complete Right  12/30/2012   *RADIOLOGY REPORT*  Clinical Data: Right wrist pain.  Arthritis. No trauma history submitted.  RIGHT WRIST - COMPLETE 3+ VIEW  Comparison:  12/26/2012 from [facility].  Findings: Scaphoid intact. No acute fracture or dislocation. Redemonstration of advanced osteoarthritis involving the base of thumb and the radial aspect of the carpal bones.  There is also subchondral cyst formation within the triquetrum, likely degenerative.  IMPRESSION: Osteoarthritis, without acute osseous finding.   Original Report Authenticated By: Jeronimo Greaves, M.D.    Review of Systems  Constitutional: Positive for malaise/fatigue.  HENT: Positive for hearing loss. Negative for ear pain and neck pain.   Eyes: Positive for pain. Negative for photophobia.  Respiratory: Negative for cough and hemoptysis.   Cardiovascular: Positive for chest pain. Negative for palpitations, orthopnea and leg swelling.  Gastrointestinal: Positive for heartburn. Negative for nausea, vomiting and abdominal  pain.  Genitourinary: Negative for frequency and hematuria.  Musculoskeletal: Positive for joint pain.  Skin: Negative for rash.  Neurological: Positive for weakness. Negative for dizziness, tingling, tremors and headaches.  Psychiatric/Behavioral: Positive for substance abuse. Negative for suicidal ideas and hallucinations. The patient is nervous/anxious.    Blood pressure 160/74, pulse 63, temperature 98.1 F (36.7 C), temperature source Oral, resp. rate 18, height 5\' 2"  (1.575 m), weight 47.219 kg (104 lb 1.6 oz), SpO2 99.00%. Physical Exam  Nursing note and vitals reviewed. Constitutional: She is oriented to person, place, and time. No distress.  Thin woman, NAD  HENT:  Head: Normocephalic and atraumatic.  Nose: Nose normal.  Mouth/Throat: Oropharynx is clear and moist. No oropharyngeal exudate.  Eyes: Conjunctivae and EOM are normal. Pupils are equal, round, and reactive to light. No scleral icterus.  Neck: Normal range of motion. Neck supple. No tracheal deviation present. No thyromegaly present.  JVP 2 cm above clavicle at 45 degrees  Cardiovascular: Normal  rate, regular rhythm and intact distal pulses.  Exam reveals no gallop.   Murmur heard. II/VI SEM at LSB   Respiratory: Effort normal. No respiratory distress. She has no wheezes. She has no rales. She exhibits tenderness.  Fairly CTAB anteriorly  GI: Soft. Bowel sounds are normal. She exhibits no distension. There is no rebound.  Musculoskeletal: Normal range of motion. She exhibits tenderness. She exhibits no edema.  Tender hands/finger joints right > left  Neurological: She is alert and oriented to person, place, and time. No cranial nerve deficit. Coordination normal.  Skin: Skin is warm and dry. She is not diaphoretic. No erythema.  Psychiatric: She has a normal mood and affect. Her behavior is normal.  ECG reviewed from old and currently: sinus bradycardia with 1st degree AV Block, PR 224 msec, HR 58; telemetry strips reviewed and several very brief periods of 2 to less than 3 second pauses/bradycardia and one period of junctional rhythm with HR 40s  Labs reviewed; wbc 2, h/h 10.8/31.3, plt 189, na 129, K 4.5 to 3.6, cr 0.54, troponin negative, bnp 582  Problem List Bradycardia Chest Pain Hyponatremia Leukopenia Diabetes Insipidus Hypothyroidism Hypokalemia Anemia Arthritis/RA Tobacco abuse GERD Subtle LSB murmur - may be 2/2 aortic sclerosis or TR  Assessment/Plan: 72 yo woman with PMH of hypertension, hypothyroidism, tobacco abuse, arthritis, DI on ddavp here being treated for arthritis/tenosynovitis/RA who had bradycardia and some chest/upper abdominal discomfort. Differential is certainly broad - musculoskeletal - arthritis/RA pain being very possible, coronary disease also reasonable given age/risk factors/family history/tobacco use, GERD among other etiologies. She has prior ECGs with sinus bradycardia and she does have a prescription for home propranolol which has not been given in this hospitalization to my knowledge. Differential for bradycardia - sinus with all < 3 second  brief pauses and one run of junctional bradycardia can certainly have vagal stimulus with pain, anxiety, etc. At this time, I favor continued telemetry, trend troponins, treat anxiety, trial GI cocktail, gradually reduce blood pressure with non-AV nodal blockers (amlodipine, hydralazine seam like reasonable agents). Echo in AM also reasonable.  - already received 324 mg asa - echo in AM - trend troponins - ECG for any symptoms; on telemetry - pain medication deferred to primary team - use clinical story overnight and surface TTE prior to considering need for stress imaging - trial GI cocktail - gradually reduce blood pressure - may respond to anxiety reduction, rest; otherwise, amlodipine or hydralazine are reasonable agents to start with - if symptoms change,  will readdress potential for UA; please call if clinical changes/questions - hold AV nodal blockers - smoking cessation counseling; patient is motivated to quit and has already reduced  - repeat chest x-ray  Jayquan Bradsher 01/01/2013, 2:46 AM

## 2013-01-01 NOTE — Progress Notes (Addendum)
[facility] PROGRESS NOTE  Caroline Nichols BJY:[id number] DOB: 08-01-1941 DOA: 12/26/2012 PCP: [name], MD  Brief narrative Patient is a 72 year old African American female with a past medical history of "arthritis", hypertension, chronic abdominal pain, hypothyroidism, anxiety, diabetes insipidus on DDAVP who presented with pain and swelling of her right hand and her sternoclavicular joint. She was initially assessed in [facility], and then [facility]. She was seen by hand surgery-Dr. Izora Ribas, who recommended continued IV antibiotics and anti-inflammatories. Her RA factor is significantly elevated, ANA and anti-CCP is still pending. She very well may have underlying rheumatoid arthritis. Her right hand swelling and sternoclavicular joint swelling are significantly better with empiric antibiotics and anti-inflammatory in the form of naproxen. Prednisone was considered, however since she is responding to the above therapy, we are currently holding off- till we are sure that she has no bacteremia and this is not definite infection.    Assessment/Plan: Cellulitis of multiple sites of right hand and fingers  -not sure if this is infectious or Rheumatologic, however significant improvement with empiric antibiotics (vancomycin and Zosyn, and naproxen)  -c/w Vanco (Day 6)  -c/w Naprosyn- but decrease the dose to 250 mg twice a day-this patient complains of some stomach pain.  -Her RA Factor significantly elevated, Anti-CCP-was less than 2  -ANA-negative  Dr. Thedore Mins discussed her case with Dr. [name] hand surgeon on 12/30/2012, Dr. [name] aspirated her right wrist, unfortunately fluid was minimal it could only be set up for cultures, since infection is still a possibility but we'll wait for final cultures. Culture thus far are negative to date.  Gram positive bacteremia-contaminant  - 1 of 2 blood cultures on 12/26/12 grew coagulase negative  staph.  Hx of Arthritis  -given hx of multiple joints involved-likely she does have a rheumatoid arthritis  -trial on Naprosyn seems to be working along with antibiotics,suspect we can hold off on steroids for the time being, and follow clinical course  - Her sternoclavicular joint is significantly better as well.   HTN  -D/C Inderal, add Norvasc. Uncontrolled. Start oral hydralazine  -on prn Hydralazine as well   Hypothyroidism  -c/w Levothyroxine  -TSH slightly elevated at 11/7. Repeat TSH: 12.3, free T3:2 and total T4-11.6. We'll increase Synthroid and recommend followup of TSH in 4-6 weeks.  SMOKER  -counseled extensively  -on transdermal nicotine   Diabetic Insipidus with hyponatremia  -c/w DDAVP  Urine osmolality and urine sodium and low, sodium is improving on normal saline, this really represents true dehydration,. Clinically appears euvolemic. DC IV fluids and follow BMP.  Episode of "choking sensation" on 5/26  - This has resolved with just assurance this-suspect this is more from anxiety.   Anxiety  - Suspect this is a bit component to ongoing symptoms. Continue with Xanax.   History of chronic abdominal pain  - Reviewed outpatient notes-? IBS- apparently she has had quite a extensive workup in the past. She confirms that this pain is chronic.  - Her belly is soft, she is on a PPI we'll increase the dose to twice a day-decreased naproxen to 250 mg twice a day (she was on 500 mg 3 times a day until yesterday)   Severe malnutrition in the context of chronic illness  - Continue with supplements   Diarrhea likely antibiotic induced.  Diarrhea is antibiotic induced C. difficile negative, will add Imodium when necessary.   Asymptomatic bradycardia Inderal discontinued. Synthroid increased for elevated TSH. Heart rate  is stable. Troponins x4: Negative. 2-D echo pending. Cardiology input appreciated  Anemia and leukopenia: Stable. Unclear etiology.  Code Status:  Full Family Communication: None  Disposition Plan: Remains in patient   Consultants:  Orthopedics  Cardiology  Procedures:  Right wrist fluid aspiration by Dr. [name] on 12/30/2012   Antibiotics:  IV vancomycin   HPI/Subjective: Right wrist pain has significantly improved compared to admission. Denies chest pain, dyspnea or palpitations or dizziness. Overnight events noted- transferred to step down secondary bradycardia and pauses on monitor.  Objective: Filed Vitals:   01/01/13 0359 01/01/13 0800 01/01/13 0940 01/01/13 1303  BP: 167/86 184/83 184/83 132/70  Pulse: 64 63  59  Temp: 98.3 F (36.8 C) 98.3 F (36.8 C)  98.1 F (36.7 C)  TempSrc: Oral Oral  Oral  Resp: 16 19  20   Height:      Weight: 47.2 kg (104 lb 0.9 oz)     SpO2: 99% 98%  98%    Intake/Output Summary (Last 24 hours) at 01/01/13 1416 Last data filed at 01/01/13 0300  Gross per 24 hour  Intake   1725 ml  Output      0 ml  Net   1725 ml   Filed Weights   12/27/12 0029 01/01/13 0359  Weight: 47.219 kg (104 lb 1.6 oz) 47.2 kg (104 lb 0.9 oz)    Exam:   General exam: Comfortable.  Respiratory system: Clear. No increased work of breathing.  Cardiovascular system: S1 & S2 heard, RRR. No JVD, murmurs, gallops, clicks or pedal edema. Telemetry: Sinus bradycardia in the 50s-sinus rhythm in the 60s. Overnight had some sinus bradycardia in the 40s with pause of 2.29 seconds.  Gastrointestinal system: Abdomen is nondistended, soft and nontender. Normal bowel sounds heard.  Central nervous system: Alert and oriented. No focal neurological deficits.  Extremities: Symmetric 5 x 5 power. Right wrist mildly warm but not significantly swollen or tender. Has immobilizer on the wrist.   Data Reviewed: Basic Metabolic Panel:  Recent Labs Lab 12/28/12 0355 12/29/12 1133 12/30/12 0518 12/31/12 0518 01/01/13 0111  NA 131* 125* 126* 129* 133*  K 3.9 3.1* 3.2* 4.5 3.6  CL 96 89* 91* 96 95*  CO2 22  22 22 22 28   GLUCOSE 99 75 73 88 88  BUN 15 9 6 10 12   CREATININE 0.53 0.55 0.42* 0.54 0.55  CALCIUM 9.3 8.4 8.6 9.5 9.4   Liver Function Tests:  Recent Labs Lab 12/26/12 1945 12/27/12 0214  AST 18 16  ALT 14 12  ALKPHOS 88 77  BILITOT 0.5 0.6  PROT 8.2 7.3  ALBUMIN 3.5 3.1*   No results found for this basename: LIPASE, AMYLASE,  in the last 168 hours No results found for this basename: AMMONIA,  in the last 168 hours CBC:  Recent Labs Lab 12/26/12 1945 12/27/12 0214 12/28/12 0355 12/29/12 1133 12/30/12 0518 01/01/13 0111  WBC 5.1 4.8 3.4* 2.0* 2.0* 1.9*  NEUTROABS 1.4*  --   --   --   --  0.5*  HGB 12.2 11.1* 11.1* 10.0* 10.8* 11.8*  HCT 35.7* 32.9* 33.3* 28.5* 31.3* 34.8*  MCV 90.6 89.4 89.8 86.1 84.8 88.5  PLT 234 201 190 183 189 220   Cardiac Enzymes:  Recent Labs Lab 12/31/12 1920 01/01/13 0111 01/01/13 0755 01/01/13 1220  TROPONINI <0.30 <0.30 <0.30 <0.30   BNP (last 3 results)  Recent Labs  01/01/13 0111  PROBNP 582.3*   CBG:  Recent Labs Lab 12/27/12 0731 12/27/12  2132 12/28/12 1240 12/29/12 0717 12/31/12 1105  GLUCAP 98 96 102* 87 85    Recent Results (from the past 240 hour(s))  CULTURE, BLOOD (ROUTINE X 2)     Status: None   Collection Time    12/26/12  7:39 PM      Result Value Range Status   Specimen Description BLOOD LEFT ANTECUBITAL   Final   Special Requests BOTTLES DRAWN AEROBIC AND ANAEROBIC 4CC   Final   Culture  Setup Time 12/29/2012 14:36   Final   Culture     Final   Value: STAPHYLOCOCCUS SPECIES (COAGULASE NEGATIVE)     Note: THE SIGNIFICANCE OF ISOLATING THIS ORGANISM FROM A SINGLE VENIPUNCTURE CANNOT BE PREDICTED WITHOUT FURTHER CLINICAL AND CULTURE CORRELATION. SUSCEPTIBILITIES AVAILABLE ONLY ON REQUEST.     Note: Gram Stain Report Called to,Read Back By and Verified With: SHARON [facility] 12/29/12 0600 RESSEGGER R  Performed at [facility]   Report Status 01/01/2013 FINAL   Final  CULTURE, BLOOD (ROUTINE  X 2)     Status: None   Collection Time    12/26/12  7:48 PM      Result Value Range Status   Specimen Description BLOOD LEFT HAND   Final   Special Requests BOTTLES DRAWN AEROBIC AND ANAEROBIC 4CC   Final   Culture NO GROWTH 5 DAYS   Final   Report Status 12/31/2012 FINAL   Final  CLOSTRIDIUM DIFFICILE BY PCR     Status: None   Collection Time    12/29/12 10:31 PM      Result Value Range Status   C difficile by pcr NEGATIVE  NEGATIVE Final  BODY FLUID CULTURE     Status: None   Collection Time    12/30/12  6:25 PM      Result Value Range Status   Specimen Description SYNOVIAL RIGHT WRIST   Final   Special Requests NONE   Final   Gram Stain     Final   Value: ABUNDANT WBC PRESENT, PREDOMINANTLY PMN     NO ORGANISMS SEEN   Culture NO GROWTH 1 DAY   Final   Report Status PENDING   Incomplete  MRSA PCR SCREENING     Status: None   Collection Time    01/01/13  3:19 AM      Result Value Range Status   MRSA by PCR NEGATIVE  NEGATIVE Final   Comment:            The GeneXpert MRSA Assay (FDA     approved for NASAL specimens     only), is one component of a     comprehensive MRSA colonization     surveillance program. It is not     intended to diagnose MRSA     infection nor to guide or     monitor treatment for     MRSA infections.     Studies: No results found.   Additional labs:   Scheduled Meds: . amLODipine  10 mg Oral Daily  . aspirin EC  81 mg Oral Daily  . desmopressin  0.2 mg Oral QHS  . enoxaparin (LOVENOX) injection  30 mg Subcutaneous Q24H  . feeding supplement  237 mL Oral Q24H  . levothyroxine  125 mcg Oral QAC breakfast  . naproxen  250 mg Oral BID WC  . nicotine  14 mg Transdermal Daily  . pantoprazole  40 mg Oral BID  . saccharomyces boulardii  250 mg Oral BID  .  senna  2 tablet Oral BID  . vancomycin  750 mg Intravenous Q12H   Continuous Infusions:    Principal Problem:   Cellulitis of multiple sites of right hand and fingers Active  Problems:   Other postablative hypothyroidism   SMOKER   HYPERTENSION   Pain in clavicular joint   Hypokalemia   Hyponatremia    Time spent: 25 minutes    [facility]  [facility] Pager [phone].   If 8PM-8AM, please contact night-coverage at [email or web address], password [facility] 01/01/2013, 2:16 PM  LOS: 6 days

## 2013-01-02 ENCOUNTER — Other Ambulatory Visit: Payer: Self-pay | Admitting: Physician Assistant

## 2013-01-02 ENCOUNTER — Inpatient Hospital Stay (HOSPITAL_COMMUNITY): Payer: Medicare Other

## 2013-01-02 DIAGNOSIS — R079 Chest pain, unspecified: Secondary | ICD-10-CM

## 2013-01-02 LAB — BASIC METABOLIC PANEL
CO2: 27 mEq/L (ref 19–32)
GFR calc non Af Amer: >90 mL/min (ref >90)
Glucose, Bld: 96 mg/dL (ref 70–99)
Potassium: 3.7 mEq/L (ref 3.5–5.1)
Sodium: 135 mEq/L (ref 135–145)

## 2013-01-02 LAB — CBC
Hemoglobin: 12.8 g/dL (ref 12.0–15.0)
MCHC: 33.7 g/dL (ref 30.0–36.0)
RBC: 4.28 MIL/uL (ref 3.87–5.11)

## 2013-01-02 LAB — VANCOMYCIN, TROUGH: Vancomycin Tr: 9.3 ug/mL — ABNORMAL LOW (ref 10.0–20.0)

## 2013-01-02 MED ORDER — ALBUTEROL SULFATE (5 MG/ML) 0.5% IN NEBU
INHALATION_SOLUTION | RESPIRATORY_TRACT | Status: AC
  Administered 2013-01-02: 2.5 mg
  Filled 2013-01-02: qty 0.5

## 2013-01-02 MED ORDER — LISINOPRIL 10 MG PO TABS
10.0000 mg | ORAL_TABLET | Freq: Every day | ORAL | Status: DC
  Administered 2013-01-02 – 2013-01-04 (×3): 10 mg via ORAL
  Filled 2013-01-02 (×3): qty 1

## 2013-01-02 MED ORDER — ENOXAPARIN SODIUM 40 MG/0.4ML ~~LOC~~ SOLN
40.0000 mg | SUBCUTANEOUS | Status: DC
  Administered 2013-01-02 – 2013-01-04 (×3): 40 mg via SUBCUTANEOUS
  Filled 2013-01-02 (×3): qty 0.4

## 2013-01-02 MED ORDER — IPRATROPIUM BROMIDE 0.02 % IN SOLN
RESPIRATORY_TRACT | Status: AC
  Administered 2013-01-02: 0.5 mg
  Filled 2013-01-02: qty 2.5

## 2013-01-02 NOTE — Progress Notes (Signed)
Patient ID: Caroline Nichols, female   DOB: 29-Jul-1941, 72 y.o.   MRN: [id number]    SUBJECTIVE: Patient reports some lower chest discomfort early this morning that resolved with belching.  She has ruled out for MI.    Marland Kitchen amLODipine  10 mg Oral Daily  . aspirin EC  81 mg Oral Daily  . desmopressin  0.2 mg Oral QHS  . enoxaparin (LOVENOX) injection  40 mg Subcutaneous Q24H  . feeding supplement  237 mL Oral Q24H  . levothyroxine  125 mcg Oral QAC breakfast  . lisinopril  10 mg Oral Daily  . naproxen  250 mg Oral BID WC  . nicotine  14 mg Transdermal Daily  . pantoprazole  40 mg Oral BID  . saccharomyces boulardii  250 mg Oral BID  . senna  2 tablet Oral BID  . vancomycin  750 mg Intravenous Q12H      Filed Vitals:   01/02/13 0400 01/02/13 0605 01/02/13 0700 01/02/13 0930  BP: 154/74 143/72 158/74   Pulse: 75 66 61   Temp: 98.2 F (36.8 C)  98 F (36.7 C)   TempSrc: Oral     Resp: 19 20 17    Height:      Weight:      SpO2: 100% 100% 100% 89%    Intake/Output Summary (Last 24 hours) at 01/02/13 1126 Last data filed at 01/02/13 0900  Gross per 24 hour  Intake    890 ml  Output      0 ml  Net    890 ml    LABS: Basic Metabolic Panel:  Recent Labs  40/98/11 0111 01/02/13 0515  NA 133* 135  K 3.6 3.7  CL 95* 96  CO2 28 27  GLUCOSE 88 96  BUN 12 15  CREATININE 0.55 0.59  CALCIUM 9.4 9.9   Liver Function Tests: No results found for this basename: AST, ALT, ALKPHOS, BILITOT, PROT, ALBUMIN,  in the last 72 hours No results found for this basename: LIPASE, AMYLASE,  in the last 72 hours CBC:  Recent Labs  01/01/13 0111 01/02/13 0515  WBC 1.9* 3.3*  NEUTROABS 0.5*  --   HGB 11.8* 12.8  HCT 34.8* 38.0  MCV 88.5 88.8  PLT 220 236   Cardiac Enzymes:  Recent Labs  01/01/13 0111 01/01/13 0755 01/01/13 1220  TROPONINI <0.30 <0.30 <0.30   BNP: No components found with this basename: POCBNP,  D-Dimer: No results found for this basename: DDIMER,  in the  last 72 hours Hemoglobin A1C: No results found for this basename: HGBA1C,  in the last 72 hours Fasting Lipid Panel: No results found for this basename: CHOL, HDL, LDLCALC, TRIG, CHOLHDL, LDLDIRECT,  in the last 72 hours Thyroid Function Tests:  Recent Labs  12/31/12 1950  TSH 12.303*  T4TOTAL 11.6  T3FREE 2.0*   Anemia Panel: No results found for this basename: VITAMINB12, FOLATE, FERRITIN, TIBC, IRON, RETICCTPCT,  in the last 72 hours  RADIOLOGY: Dg Clavicle Left  12/26/2012   *RADIOLOGY REPORT*  Clinical Data: Painful swollen sternoclavicular joint and shoulder pain  LEFT CLAVICLE - 2+ VIEWS  Comparison: Chest radiograph 10/22/2012  Findings: There is mild degenerative change at the acromioclavicular joint.  Artifact overlies the distal clavicle on one view.  There is no clavicular destruction or fracture.  Mild left glenohumeral joint degenerative change.  IMPRESSION: No acute osseous abnormality of the left clavicle.  If there is strong clinical suspicion for sternoclavicular osteomyelitis or other radiographically occult  finding, consider CT of the upper chest with contrast for further evaluation (contrast should be injected in the contralateral right side).   Original Report Authenticated By: Christiana Pellant, M.D.   Dg Clavicle Right  12/26/2012   *RADIOLOGY REPORT*  Clinical Data: Pain and swelling of the sternoclavicular joint  RIGHT CLAVICLE - 2+ VIEWS  Comparison: The chest radiograph 10/22/2012  Findings: No clavicular fracture identified.  Mild right AC joint degenerative change.  Right lung apex is clear.  IMPRESSION: No acute abnormality.   Original Report Authenticated By: Christiana Pellant, M.D.   Dg Wrist Complete Right  12/30/2012   *RADIOLOGY REPORT*  Clinical Data: Right wrist pain.  Arthritis. No trauma history submitted.  RIGHT WRIST - COMPLETE 3+ VIEW  Comparison: 12/26/2012 from [facility].  Findings: Scaphoid intact. No acute fracture or dislocation.  Redemonstration of advanced osteoarthritis involving the base of thumb and the radial aspect of the carpal bones.  There is also subchondral cyst formation within the triquetrum, likely degenerative.  IMPRESSION: Osteoarthritis, without acute osseous finding.   Original Report Authenticated By: Jeronimo Greaves, M.D.   Ct Chest W Contrast  12/27/2012   *RADIOLOGY REPORT*  Clinical Data: Pain and inflammation in the region of the sternoclavicular joints.  Clinical concern for osteomyelitis.  CT CHEST WITH CONTRAST  Technique:  Multidetector CT imaging of the chest was performed following the standard protocol during bolus administration of intravenous contrast.  Contrast: 80mL OMNIPAQUE IOHEXOL 300 MG/ML  SOLN  Comparison: Bilateral clavicle radiographs obtained yesterday.  Findings: Subarticular cyst formation and bony irregularity on the manubrial side of the right sternoclavicular joint.  These changes are well corticated.  Mild right inferior clavicle spur formation. Normal appearing left sternoclavicular joint.  No abnormal fluid collections.  Thoracic spine degenerative changes.  Mild bullous changes in both lungs, most pronounced in the upper lobes.  No lung masses or enlarged lymph nodes.  Unremarkable upper abdomen.  IMPRESSION:  1.  Right sternoclavicular osteoarthritis. 2.  No evidence of osteomyelitis. 3.  COPD with centrilobular and paraseptal emphysema.   Original Report Authenticated By: Beckie Salts, M.D.   Dg Chest Port 1 View  12/28/2012   *RADIOLOGY REPORT*  Clinical Data: Shortness of breath.  PORTABLE CHEST - 1 VIEW  Comparison: 10/22/2012.  Findings: The heart is borderline enlarged but stable.  Mild stable emphysematous changes and upper lobe scarring.  No infiltrates, edema or effusions.  IMPRESSION:  1.  Chronic lung changes but no acute pulmonary findings. 2.  Stable borderline cardiac enlargement.   Original Report Authenticated By: Rudie Meyer, M.D.   Dg Hand Complete Right  12/26/2012    *RADIOLOGY REPORT*  Clinical Data: Right hand pain and swelling and  RIGHT HAND - COMPLETE 3+ VIEW  Comparison: None.  Findings: There is no evidence of fracture or dislocation. Significant degenerative change is noted at the first carpometacarpal joint, with mild surrounding bone formation and joint space loss.  Remaining visualized joint spaces are grossly preserved.  The carpal rows are grossly intact, and demonstrate normal alignment.  No significant soft tissue abnormalities are characterized on radiograph.  IMPRESSION:  1.  No evidence of fracture or dislocation. 2.  Significant osteoarthritis at the first carpometacarpal joint.   Original Report Authenticated By: Tonia Ghent, M.D.    PHYSICAL EXAM General: NAD Neck: No JVD, no thyromegaly or thyroid nodule.  Lungs: Clear to auscultation bilaterally with normal respiratory effort. CV: Nondisplaced PMI.  Heart regular S1/S2, no S3/S4, no murmur.  No peripheral  edema.   Abdomen: Soft, nontender, no hepatosplenomegaly, no distention.  Neurologic: Alert and oriented x 3.  Psych: Normal affect. Extremities: No clubbing or cyanosis. Arthritic changes in hands.   TELEMETRY: Reviewed telemetry pt in NSR, no significant bradycardia overnight on telemetry.   ASSESSMENT AND PLAN: 72 yo with multiple medical problems, cardiology asked to evaluate for chest pain and bradycardia. 1. Bradycardia: No significant events overnight.  Avoid nodal blockers. 2. HTN: Would use lisinopril instead of hydralazine as long as no contraindication (do not see one).  3. Chest pain: Sounds GI-related to me, belching helps.  Cardiac enzymes negative and echo unremarkable.  I will set up stress test as an outpatient.   Call with further questions.   Marca Ancona 01/02/2013 11:31 AM

## 2013-01-02 NOTE — Progress Notes (Signed)
Per request of Dr. Shirlee Latch, I left message on our scheduling voicemail for [facility] and f/u appointment in our office. Our office will call patient with these appts. Jilliam Bellmore PA-C

## 2013-01-02 NOTE — Progress Notes (Signed)
Pt desats during telephone calls and eating. High anxiety level, frequent (hourly) urination. Pt required non rebreather mask and breathing tx at 09:00 am post xanax. Will continue to provide a supportive environment.

## 2013-01-02 NOTE — Progress Notes (Signed)
ANTIBIOTIC CONSULT NOTE - FOLLOW UP  Pharmacy Consult for Vancomycin Indication: right hand cellulitis, positive blood culture  Allergies  Allergen Reactions  . Codeine     REACTION: NAUSEA  . Tetanus Toxoid     REACTION: ARM SWELLING,REDNESS    Patient Measurements: Height: 5\' 2"  (157.5 cm) Weight: 104 lb 0.9 oz (47.2 kg) IBW/kg (Calculated) : 50.1  Vital Signs: Temp: 97.8 F (36.6 C) (05/31 1600) BP: 137/79 mmHg (05/31 1600) Pulse Rate: 80 (05/31 1600) Intake/Output from previous day: 05/30 0701 - 05/31 0700 In: 650 [P.O.:350; IV Piggyback:300] Out: -  Intake/Output from this shift:    Labs:  Recent Labs  12/31/12 0518 01/01/13 0111 01/02/13 0515  WBC  --  1.9* 3.3*  HGB  --  11.8* 12.8  PLT  --  220 236  CREATININE 0.54 0.55 0.59   Estimated Creatinine Clearance: 48.1 ml/min (by C-G formula based on Cr of 0.59).  Recent Labs  12/31/12 0903 01/02/13 2100  VANCOTROUGH 5.7* 9.3*    Assessment: 72 year old female on Day #8 of Vancomycin for cellulitis and positive blood culture, which revealed 1/2 CoNS, which is likely a contaminant.  Her Vancomycin trough is low, but will accumulate to goal with repeated dosing. Also noted plans to possibly switch to PO abx in AM. Pt is afebrile, WBC is nml and Scr has remained <1.  Goal of Therapy:  Vancomycin trough level 10-15 mcg/ml  Plan:  Continue Vancomycin 750mg  IV q12h Will monitor cx/spec/sens, renal fn and clinical status daily.  Thanks, Neosha Switalski K. Allena Katz, PharmD, BCPS.  Clinical Pharmacist Pager [phone]. 01/02/2013 9:46 PM

## 2013-01-02 NOTE — Progress Notes (Signed)
[facility] PROGRESS NOTE  Caroline Nichols ZOX:[id number] DOB: Sep 03, 1940 DOA: 12/26/2012 PCP: [name], MD  Brief narrative Patient is a 72 year old African American female with a past medical history of "arthritis", hypertension, chronic abdominal pain, hypothyroidism, anxiety, diabetes insipidus on DDAVP who presented with pain and swelling of her right hand and her sternoclavicular joint. She was initially assessed in [facility], and then transferred to [facility] for their evaluation. She was seen by hand surgery-Dr. Izora Ribas, who recommended continued IV antibiotics and anti-inflammatories. Her RA factor is significantly elevated, ANA and anti-CCP is still pending. She very well may have underlying rheumatoid arthritis. Her right hand swelling and sternoclavicular joint swelling are significantly better with empiric antibiotics and anti-inflammatory in the form of naproxen. Prednisone was considered, however since she is responding to the above therapy, we are currently holding off- till we are sure that she has no bacteremia and this is not definite infection.    Assessment/Plan: Cellulitis of multiple sites of right hand and fingers  -not sure if this is infectious or Rheumatologic, however significant improvement with empiric antibiotics (vancomycin and Zosyn, and naproxen)  -c/w Vanco -c/w Naprosyn- but decrease the dose to 250 mg twice a day-secondary to GI side effects.  -Her RA Factor significantly elevated, Anti-CCP-was less than 2 (negative)  -ANA-negative  Dr. Thedore Mins discussed her case with Dr. [name] hand surgeon on 12/30/2012, Dr. [name] aspirated her right wrist, unfortunately fluid was minimal it could only be set up for cultures, since infection is still a possibility but we'll wait for final cultures. Culture thus far are negative to date-we'll consider changing to by mouth antibiotics in the a.m..  Gram positive bacteremia-contaminant  - 1 of 2 blood  cultures on 12/26/12 grew coagulase negative staph.  Hx of Arthritis  -given hx of multiple joints involved-likely she does have a rheumatoid arthritis  -trial on Naprosyn seems to be working along with antibiotics,suspect we can hold off on steroids for the time being, and follow clinical course  - Her sternoclavicular joint is significantly better as well.   HTN  -D/C Inderal, add Norvasc. Uncontrolled. Change hydralazine to lisinopril as per cardiology advice.  -on prn Hydralazine as well   Hypothyroidism  -c/w Levothyroxine  -TSH slightly elevated at 11/7. Repeat TSH: 12.3, free T3:2 and total T4-11.6. We'll increase Synthroid and recommend followup of TSH in 4-6 weeks.  SMOKER  -counseled extensively  -on transdermal nicotine   Diabetic Insipidus with hyponatremia  -c/w DDAVP  Urine osmolality and urine sodium and low, sodium is improving on normal saline, this really represents true dehydration,. Clinically appears euvolemic. DC IV fluids and follow BMP.  Episode of "choking sensation" on 5/26  - This has resolved with just assurance this-suspect this is more from anxiety.   Anxiety  - Suspect this is a bit component to ongoing symptoms. Continue with Xanax.   History of chronic abdominal pain  - Reviewed outpatient notes-? IBS- apparently she has had quite a extensive workup in the past. She confirms that this pain is chronic.  - Her belly is soft, she is on a PPI we'll increase the dose to twice a day-decreased naproxen to 250 mg twice a day. Complains of epigastric pain when hungry and improves after by mouth intake and belching.  Severe malnutrition in the context of chronic illness  - Continue with supplements   Diarrhea likely antibiotic induced.  Diarrhea is antibiotic induced C. difficile negative, will add Imodium when necessary. Seems  to have resolved.  Asymptomatic bradycardia/? Chest pain Inderal discontinued. Synthroid increased for elevated TSH. Heart rate  is stable. Troponins x4: Negative. 2-D echo with normal EF. Cardiology input appreciated-arrange for outpatient stress test. Chest pain is likely noncardiac/GI  Anemia and leukopenia: Stable. Unclear etiology.  Code Status: Full Family Communication: Discussed with daughter, [name] via phone.  Disposition Plan: Remains in patient. Continue monitoring in the step down unit for additional 24 hours. Patient states that she lives alone and does not think that she can return and manage herself. Requested social work consult for possible NH placement.    Consultants:  Orthopedics  Cardiology  Procedures:  Right wrist fluid aspiration by Dr. [name] on 12/30/2012   Antibiotics:  IV vancomycin   HPI/Subjective: Right wrist pain has significantly improved compared to admission/Almost resolved. Denies chest pain, dyspnea or palpitations or dizziness.  epigastric intermittent pain, worse when hungry and improves after eating and belching. O2 saturations were bleeding low this morning secondary to defective sensor-resolved after changing sensor.  Objective: Filed Vitals:   01/02/13 0700 01/02/13 0930 01/02/13 1200 01/02/13 1300  BP: 158/74  158/89   Pulse: 61  63 71  Temp: 98 F (36.7 C)  98 F (36.7 C)   TempSrc:      Resp: 17  23 24   Height:      Weight:      SpO2: 100% 89% 94% 97%    Intake/Output Summary (Last 24 hours) at 01/02/13 1540 Last data filed at 01/02/13 0900  Gross per 24 hour  Intake    890 ml  Output      0 ml  Net    890 ml   Filed Weights   12/27/12 0029 01/01/13 0359  Weight: 47.219 kg (104 lb 1.6 oz) 47.2 kg (104 lb 0.9 oz)    Exam:   General exam: Comfortable.  Respiratory system: Clear. No increased work of breathing.  Cardiovascular system: S1 & S2 heard, RRR. No JVD, murmurs, gallops, clicks or pedal edema. Telemetry:  mostly sinus rhythm an occasional sinus bradycardia in the 50s.  Gastrointestinal system: Abdomen is nondistended, soft  and  minimal epigastric tenderness without rigidity, guarding or rebound. Normal bowel sounds heard.  Central nervous system: Alert and oriented. No focal neurological deficits.  Extremities: Symmetric 5 x 5 power. Right wrist without any acute signs at this time. Has immobilizer on the wrist.   Data Reviewed: Basic Metabolic Panel:  Recent Labs Lab 12/29/12 1133 12/30/12 0518 12/31/12 0518 01/01/13 0111 01/02/13 0515  NA 125* 126* 129* 133* 135  K 3.1* 3.2* 4.5 3.6 3.7  CL 89* 91* 96 95* 96  CO2 22 22 22 28 27   GLUCOSE 75 73 88 88 96  BUN 9 6 10 12 15   CREATININE 0.55 0.42* 0.54 0.55 0.59  CALCIUM 8.4 8.6 9.5 9.4 9.9   Liver Function Tests:  Recent Labs Lab 12/26/12 1945 12/27/12 0214  AST 18 16  ALT 14 12  ALKPHOS 88 77  BILITOT 0.5 0.6  PROT 8.2 7.3  ALBUMIN 3.5 3.1*   No results found for this basename: LIPASE, AMYLASE,  in the last 168 hours No results found for this basename: AMMONIA,  in the last 168 hours CBC:  Recent Labs Lab 12/26/12 1945  12/28/12 0355 12/29/12 1133 12/30/12 0518 01/01/13 0111 01/02/13 0515  WBC 5.1  < > 3.4* 2.0* 2.0* 1.9* 3.3*  NEUTROABS 1.4*  --   --   --   --  0.5*  --   HGB 12.2  < > 11.1* 10.0* 10.8* 11.8* 12.8  HCT 35.7*  < > 33.3* 28.5* 31.3* 34.8* 38.0  MCV 90.6  < > 89.8 86.1 84.8 88.5 88.8  PLT 234  < > 190 183 189 220 236  < > = values in this interval not displayed. Cardiac Enzymes:  Recent Labs Lab 12/31/12 1920 01/01/13 0111 01/01/13 0755 01/01/13 1220  TROPONINI <0.30 <0.30 <0.30 <0.30   BNP (last 3 results)  Recent Labs  01/01/13 0111  PROBNP 582.3*   CBG:  Recent Labs Lab 12/27/12 2132 12/28/12 1240 12/29/12 0717 12/31/12 1105 01/01/13 1721  GLUCAP 96 102* 87 85 92    Recent Results (from the past 240 hour(s))  CULTURE, BLOOD (ROUTINE X 2)     Status: None   Collection Time    12/26/12  7:39 PM      Result Value Range Status   Specimen Description BLOOD LEFT ANTECUBITAL   Final    Special Requests BOTTLES DRAWN AEROBIC AND ANAEROBIC 4CC   Final   Culture  Setup Time 12/29/2012 14:36   Final   Culture     Final   Value: STAPHYLOCOCCUS SPECIES (COAGULASE NEGATIVE)     Note: THE SIGNIFICANCE OF ISOLATING THIS ORGANISM FROM A SINGLE VENIPUNCTURE CANNOT BE PREDICTED WITHOUT FURTHER CLINICAL AND CULTURE CORRELATION. SUSCEPTIBILITIES AVAILABLE ONLY ON REQUEST.     Note: Gram Stain Report Called to,Read Back By and Verified With: SHARON [facility] 12/29/12 0600 RESSEGGER R  Performed at [facility]   Report Status 01/01/2013 FINAL   Final  CULTURE, BLOOD (ROUTINE X 2)     Status: None   Collection Time    12/26/12  7:48 PM      Result Value Range Status   Specimen Description BLOOD LEFT HAND   Final   Special Requests BOTTLES DRAWN AEROBIC AND ANAEROBIC 4CC   Final   Culture NO GROWTH 5 DAYS   Final   Report Status 12/31/2012 FINAL   Final  CLOSTRIDIUM DIFFICILE BY PCR     Status: None   Collection Time    12/29/12 10:31 PM      Result Value Range Status   C difficile by pcr NEGATIVE  NEGATIVE Final  BODY FLUID CULTURE     Status: None   Collection Time    12/30/12  6:25 PM      Result Value Range Status   Specimen Description SYNOVIAL RIGHT WRIST   Final   Special Requests NONE   Final   Gram Stain     Final   Value: ABUNDANT WBC PRESENT, PREDOMINANTLY PMN     NO ORGANISMS SEEN   Culture NO GROWTH 2 DAYS   Final   Report Status PENDING   Incomplete  MRSA PCR SCREENING     Status: None   Collection Time    01/01/13  3:19 AM      Result Value Range Status   MRSA by PCR NEGATIVE  NEGATIVE Final   Comment:            The GeneXpert MRSA Assay (FDA     approved for NASAL specimens     only), is one component of a     comprehensive MRSA colonization     surveillance program. It is not     intended to diagnose MRSA     infection nor to guide or     monitor treatment for     MRSA infections.  Studies: No results found.   Additional  labs:   Scheduled Meds: . amLODipine  10 mg Oral Daily  . aspirin EC  81 mg Oral Daily  . desmopressin  0.2 mg Oral QHS  . enoxaparin (LOVENOX) injection  40 mg Subcutaneous Q24H  . feeding supplement  237 mL Oral Q24H  . levothyroxine  125 mcg Oral QAC breakfast  . lisinopril  10 mg Oral Daily  . naproxen  250 mg Oral BID WC  . nicotine  14 mg Transdermal Daily  . pantoprazole  40 mg Oral BID  . saccharomyces boulardii  250 mg Oral BID  . senna  2 tablet Oral BID  . vancomycin  750 mg Intravenous Q12H   Continuous Infusions:    Principal Problem:   Cellulitis of multiple sites of right hand and fingers Active Problems:   Other postablative hypothyroidism   SMOKER   HYPERTENSION   Pain in clavicular joint   Hypokalemia   Hyponatremia   Unspecified hypothyroidism  Additional Labs:  2 D Echo: Impressions:  - Normal LV size and systolic function, EF 60-65%. Normal RV size and systolic function. No significant valvular abnormalities.     Time spent: 25 minutes    [facility]  [facility] Pager [phone].   If 8PM-8AM, please contact night-coverage at [email or web address], password [facility] 01/02/2013, 3:40 PM  LOS: 7 days

## 2013-01-03 LAB — BODY FLUID CULTURE

## 2013-01-03 NOTE — Progress Notes (Signed)
[facility] PROGRESS NOTE  Caroline Nichols WUJ:[id number] DOB: 1940-11-11 DOA: 12/26/2012 PCP: [name], MD  Brief narrative Patient is a 72 year old African American female with a past medical history of "arthritis", hypertension, chronic abdominal pain, hypothyroidism, anxiety, diabetes insipidus on DDAVP who presented with pain and swelling of her right hand and her sternoclavicular joint. She was initially assessed in [facility], and then transferred to [facility] for their evaluation. She was seen by hand surgery-Dr. Izora Ribas, who recommended continued IV antibiotics and anti-inflammatories. Her RA factor is significantly elevated, ANA and anti-CCP is still pending. She very well may have underlying rheumatoid arthritis. Her right hand swelling and sternoclavicular joint swelling are significantly better with empiric antibiotics and anti-inflammatory in the form of naproxen. Prednisone was considered, however since she is responding to the above therapy, we are currently holding off- till we are sure that she has no bacteremia and this is not definite infection.    Assessment/Plan: Cellulitis of multiple sites of right wrist, hand and fingers  -not sure if this is infectious or Rheumatologic, however significant improvement with empiric antibiotics (vancomycin and Zosyn, and naproxen)  -c/w Vanco -c/w Naprosyn- but decrease the dose to 250 mg twice a day-secondary to GI side effects. Complains of nausea- d/c Naprosyn -Her RA Factor significantly elevated, Anti-CCP-was less than 2 (negative)  -ANA-negative  Dr. Thedore Mins discussed her case with Dr. [name] hand surgeon on 12/30/2012, Dr. [name] aspirated her right wrist, unfortunately fluid was minimal it could only be set up for cultures, since infection is still a possibility but we'll wait for final cultures. Culture thus far are negative to date-we'll consider changing to by mouth antibiotics in the a.m. pending final  culture results and discussion with hand surgeon .  Gram positive bacteremia-contaminant  - 1 of 2 blood cultures on 12/26/12 grew coagulase negative staph.  Hx of Arthritis  -given hx of multiple joints involved-likely she does have a rheumatoid arthritis  -trial on Naprosyn seems to be working along with antibiotics,suspect we can hold off on steroids for the time being, and follow clinical course  - Her sternoclavicular joint is significantly better as well. Due to possible GI side effects-DC Naprosyn and use non NSAIDs for pain  HTN  -D/C Inderal, add Norvasc. Uncontrolled. Started newly on lisinopril on 5/31  -on prn Hydralazine as well   Hypothyroidism  -c/w Levothyroxine  -TSH slightly elevated at 11/7. Repeat TSH: 12.3, free T3:2 and total T4-11.6. We'll increase Synthroid  (from 112 MCG >125 MCG) and recommend followup of TSH in 4-6 weeks.  SMOKER  -counseled extensively  -on transdermal nicotine   Diabetic Insipidus with hyponatremia  -c/w DDAVP  Urine osmolality and urine sodium and low, sodium is improving on normal saline, this really represents true dehydration,. Clinically appears euvolemic. DC IV fluids and follow BMP.  Episode of "choking sensation" on 5/26  - This has resolved with just assurance this-suspect this is more from anxiety.   Anxiety  - Suspect this is a bit component to ongoing symptoms. Continue with Xanax.   History of chronic abdominal pain  - Reviewed outpatient notes-? IBS- apparently she has had quite a extensive workup in the past. She confirms that this pain is chronic.  - Her belly is soft, she is on a PPI we'll increase the dose to twice a day-decreased naproxen to 250 mg twice a day. Complains of epigastric pain when hungry and improves after by mouth intake and belching.DC Naprosyn.  Severe  malnutrition in the context of chronic illness  - Continue with supplements   Diarrhea likely antibiotic induced.  Diarrhea is antibiotic induced  C. difficile negative, will add Imodium when necessary. Seems to have resolved.  Asymptomatic bradycardia/? Chest pain Inderal discontinued. Synthroid increased for elevated TSH. Heart rate is stable. Troponins x4: Negative. 2-D echo with normal EF. Cardiology input appreciated-arrange for outpatient stress test. Chest pain is likely noncardiac/GI  Anemia and leukopenia: Stable. Unclear etiology.  Code Status: Full Family Communication: Discussed with daughter, [name] via phone on 5/31.  Disposition Plan:  transfer to telemetry. Patient states that she lives alone and does not think that she can return and manage herself. Requested social work consult for possible NH placement.    Consultants:  Orthopedics  Cardiology  Procedures:  Right wrist fluid aspiration by Dr. [name] on 12/30/2012   Antibiotics:  IV vancomycin   HPI/Subjective:  Right wrist pain minimal and intermittent. Nausea but no vomiting.   Objective: Filed Vitals:   01/03/13 0020 01/03/13 0425 01/03/13 0800 01/03/13 1200  BP: 113/62 111/58 121/71 131/71  Pulse: 71 70 63 64  Temp: 98 F (36.7 C) 98.3 F (36.8 C) 97.6 F (36.4 C) 98 F (36.7 C)  TempSrc: Oral Oral Oral Oral  Resp: 17 20 20 22   Height:      Weight:      SpO2: 100% 97% 100% 100%    Intake/Output Summary (Last 24 hours) at 01/03/13 1252 Last data filed at 01/03/13 0853  Gross per 24 hour  Intake    950 ml  Output      0 ml  Net    950 ml   Filed Weights   12/27/12 0029 01/01/13 0359  Weight: 47.219 kg (104 lb 1.6 oz) 47.2 kg (104 lb 0.9 oz)    Exam:   General exam: Comfortable.  Respiratory system: Clear. No increased work of breathing.  Cardiovascular system: S1 & S2 heard, RRR. No JVD, murmurs, gallops, clicks or pedal edema. Telemetry:  mostly sinus rhythm and occasional sinus bradycardia in the 50s.  Gastrointestinal system: Abdomen is nondistended, soft and  minimal epigastric tenderness without rigidity, guarding  or rebound. Normal bowel sounds heard.  Central nervous system: Alert and oriented. No focal neurological deficits.  Extremities: Symmetric 5 x 5 power. Right wrist without any acute signs at this time. Has immobilizer on the wrist.   Data Reviewed: Basic Metabolic Panel:  Recent Labs Lab 12/29/12 1133 12/30/12 0518 12/31/12 0518 01/01/13 0111 01/02/13 0515  NA 125* 126* 129* 133* 135  K 3.1* 3.2* 4.5 3.6 3.7  CL 89* 91* 96 95* 96  CO2 22 22 22 28 27   GLUCOSE 75 73 88 88 96  BUN 9 6 10 12 15   CREATININE 0.55 0.42* 0.54 0.55 0.59  CALCIUM 8.4 8.6 9.5 9.4 9.9   Liver Function Tests: No results found for this basename: AST, ALT, ALKPHOS, BILITOT, PROT, ALBUMIN,  in the last 168 hours No results found for this basename: LIPASE, AMYLASE,  in the last 168 hours No results found for this basename: AMMONIA,  in the last 168 hours CBC:  Recent Labs Lab 12/28/12 0355 12/29/12 1133 12/30/12 0518 01/01/13 0111 01/02/13 0515  WBC 3.4* 2.0* 2.0* 1.9* 3.3*  NEUTROABS  --   --   --  0.5*  --   HGB 11.1* 10.0* 10.8* 11.8* 12.8  HCT 33.3* 28.5* 31.3* 34.8* 38.0  MCV 89.8 86.1 84.8 88.5 88.8  PLT 190 183 189  220 236   Cardiac Enzymes:  Recent Labs Lab 12/31/12 1920 01/01/13 0111 01/01/13 0755 01/01/13 1220  TROPONINI <0.30 <0.30 <0.30 <0.30   BNP (last 3 results)  Recent Labs  01/01/13 0111  PROBNP 582.3*   CBG:  Recent Labs Lab 12/28/12 1240 12/29/12 0717 12/31/12 1105 01/01/13 1721 01/03/13 0019  GLUCAP 102* 87 85 92 98    Recent Results (from the past 240 hour(s))  CULTURE, BLOOD (ROUTINE X 2)     Status: None   Collection Time    12/26/12  7:39 PM      Result Value Range Status   Specimen Description BLOOD LEFT ANTECUBITAL   Final   Special Requests BOTTLES DRAWN AEROBIC AND ANAEROBIC 4CC   Final   Culture  Setup Time 12/29/2012 14:36   Final   Culture     Final   Value: STAPHYLOCOCCUS SPECIES (COAGULASE NEGATIVE)     Note: THE SIGNIFICANCE OF  ISOLATING THIS ORGANISM FROM A SINGLE VENIPUNCTURE CANNOT BE PREDICTED WITHOUT FURTHER CLINICAL AND CULTURE CORRELATION. SUSCEPTIBILITIES AVAILABLE ONLY ON REQUEST.     Note: Gram Stain Report Called to,Read Back By and Verified With: SHARON [facility] 12/29/12 0600 RESSEGGER R  Performed at [facility]   Report Status 01/01/2013 FINAL   Final  CULTURE, BLOOD (ROUTINE X 2)     Status: None   Collection Time    12/26/12  7:48 PM      Result Value Range Status   Specimen Description BLOOD LEFT HAND   Final   Special Requests BOTTLES DRAWN AEROBIC AND ANAEROBIC 4CC   Final   Culture NO GROWTH 5 DAYS   Final   Report Status 12/31/2012 FINAL   Final  CLOSTRIDIUM DIFFICILE BY PCR     Status: None   Collection Time    12/29/12 10:31 PM      Result Value Range Status   C difficile by pcr NEGATIVE  NEGATIVE Final  BODY FLUID CULTURE     Status: None   Collection Time    12/30/12  6:25 PM      Result Value Range Status   Specimen Description SYNOVIAL RIGHT WRIST   Final   Special Requests NONE   Final   Gram Stain     Final   Value: ABUNDANT WBC PRESENT, PREDOMINANTLY PMN     NO ORGANISMS SEEN   Culture NO GROWTH 2 DAYS   Final   Report Status PENDING   Incomplete  MRSA PCR SCREENING     Status: None   Collection Time    01/01/13  3:19 AM      Result Value Range Status   MRSA by PCR NEGATIVE  NEGATIVE Final   Comment:            The GeneXpert MRSA Assay (FDA     approved for NASAL specimens     only), is one component of a     comprehensive MRSA colonization     surveillance program. It is not     intended to diagnose MRSA     infection nor to guide or     monitor treatment for     MRSA infections.     Studies: No results found.   Additional labs:   Scheduled Meds: . amLODipine  10 mg Oral Daily  . aspirin EC  81 mg Oral Daily  . desmopressin  0.2 mg Oral QHS  . enoxaparin (LOVENOX) injection  40 mg Subcutaneous Q24H  . feeding supplement  237 mL Oral Q24H  .  levothyroxine  125 mcg Oral QAC breakfast  . lisinopril  10 mg Oral Daily  . naproxen  250 mg Oral BID WC  . nicotine  14 mg Transdermal Daily  . pantoprazole  40 mg Oral BID  . saccharomyces boulardii  250 mg Oral BID  . senna  2 tablet Oral BID  . vancomycin  750 mg Intravenous Q12H   Continuous Infusions:    Principal Problem:   Cellulitis of multiple sites of right hand and fingers Active Problems:   Other postablative hypothyroidism   SMOKER   HYPERTENSION   Pain in clavicular joint   Hypokalemia   Hyponatremia   Unspecified hypothyroidism  Additional Labs:  2 D Echo: Impressions:  - Normal LV size and systolic function, EF 60-65%. Normal RV size and systolic function. No significant valvular abnormalities.     Time spent: 25 minutes    [facility]  [facility] Pager [phone].   If 8PM-8AM, please contact night-coverage at [email or web address], password [facility] 01/03/2013, 12:52 PM  LOS: 8 days

## 2013-01-04 DIAGNOSIS — F17200 Nicotine dependence, unspecified, uncomplicated: Secondary | ICD-10-CM

## 2013-01-04 MED ORDER — AMLODIPINE BESYLATE 10 MG PO TABS: 10.0000 mg | ORAL_TABLET | Freq: Every day | ORAL | Status: DC

## 2013-01-04 MED ORDER — NICOTINE 14 MG/24HR TD PT24: 1.0000 | MEDICATED_PATCH | Freq: Every day | TRANSDERMAL | Status: DC

## 2013-01-04 MED ORDER — LEVOTHYROXINE SODIUM 125 MCG PO TABS: 125.0000 ug | ORAL_TABLET | Freq: Every day | ORAL | Status: DC

## 2013-01-04 MED ORDER — GLUCERNA SHAKE PO LIQD: 237.0000 mL | ORAL | Status: DC

## 2013-01-04 MED ORDER — ACETAMINOPHEN 325 MG PO TABS: 650.0000 mg | ORAL_TABLET | Freq: Four times a day (QID) | ORAL | Status: AC | PRN

## 2013-01-04 MED ORDER — PANTOPRAZOLE SODIUM 40 MG PO TBEC: 40.0000 mg | DELAYED_RELEASE_TABLET | Freq: Every day | ORAL | Status: DC

## 2013-01-04 MED ORDER — LISINOPRIL 10 MG PO TABS: 10.0000 mg | ORAL_TABLET | Freq: Every day | ORAL | Status: DC

## 2013-01-04 NOTE — Discharge Summary (Signed)
Physician Discharge Summary  Caroline Nichols QMV:[id number] DOB: 11/04/1940 DOA: 12/26/2012  PCP: [name], MD  Admit date: 12/26/2012 Discharge date: 01/04/2013  Time spent: Greater than 30 minutes  Recommendations for Outpatient Follow-up:  1. [facility]: MD's office will call with appointment for OP stress test and follow up. 2. Dr. [name], PCP in 1week with repeat labs (CBC). 3. Recommend out Caroline Nichols Rheumatology consult. 4. Recommend repeating TFT's in 4-6 weeks. 5. Home Health PT, OT, Aide, 3 n 1, tub bench, walker platform & rolling walker.  Discharge Diagnoses:  Principal Problem:   Cellulitis of multiple sites of right hand and fingers Active Problems:   Other postablative hypothyroidism   SMOKER   HYPERTENSION   Pain in clavicular joint   Hypokalemia   Hyponatremia   Unspecified hypothyroidism   Discharge Condition: Improved & Stable  Diet recommendation: Heart Healthy diet.  Filed Weights   12/27/12 0029 01/01/13 0359  Weight: 47.219 kg (104 lb 1.6 oz) 47.2 kg (104 lb 0.9 oz)    History of present illness:  Caroline Nichols is a 72 year old African American female with a past medical history of "arthritis", hypertension, chronic abdominal pain, hypothyroidism, anxiety, diabetes insipidus on DDAVP who presented with pain and swelling of her right hand and her sternoclavicular joint. She was initially assessed in [facility], and then transferred to [facility] for their evaluation.   Hospital Course:  1. Cellulitis of multiple sites of right wrist, hand and fingers: not sure if this is infectious or Rheumatologic. She was empirically treated with antibiotics (vancomycin and 2 days Zosyn, and naproxen). Her RA Factor significantly elevated, Anti-CCP-was less than 2 (negative). ANA-negative. Dr. [name] hand surgeon aspirated her right wrist, unfortunately fluid was minimal it could only be set up for cultures. Culture negative. She completed 9 days  Vancomycin. Discussed with Dr. [name] on day of DC and agreed to DC without any further Abx. 2. Gram positive bacteremia-contaminant. 1 of 2 blood cultures on 12/26/12 grew coagulase negative staph.  3. Hx of Arthritis: given hx of multiple joints involved-likely she does have a rheumatoid arthritis. She was treated with Naprosyn and responded well but started having GI symptoms and same was DCed. Her sternoclavicular joint is significantly better as well.  4. HTN: She developed asymptomatic bradycardia and was transferred to [facility] unit. D/Ced Inderal, add Norvasc. Started newly on lisinopril on 5/31. Improved. 5. Hypothyroidism: c/w Levothyroxine. TSH slightly elevated at 11/7. Repeat TSH: 12.3, free T3:2 and total T4-11.6. We'll increase Synthroid (from 112 MCG >125 MCG) and recommend followup of TSH in 4-6 weeks.  6. SMOKER: counseled extensively. On transdermal nicotine  7. Diabetic Insipidus with hyponatremia: c/w DDAVP. Stable. 8. Episode of "choking sensation" on 5/26: This has resolved with just assurance this-suspect this is more from anxiety.  9. Anxiety: Continue with Xanax.  10. History of chronic abdominal pain: Reviewed outpatient notes-? IBS- apparently she has had quite a extensive workup in the past. She confirms that this pain is chronic. Her belly is soft, she is on a PPI we'll increase the dose to twice a day-DCed naproxen. Complains of epigastric pain when hungry and improves after by mouth intake and belching. 11. Severe malnutrition in the context of chronic illness: Continue with supplements.  12. Diarrhea likely antibiotic induced: Diarrhea is antibiotic induced C. difficile negative. Seems to have resolved.  13. Asymptomatic bradycardia/? Chest pain: Inderal discontinued. Synthroid increased for elevated TSH. Heart rate is stable. Troponins x4: Negative. 2-D echo with  normal EF. Cardiology consulted-arranged for outpatient stress test. Chest pain is likely noncardiac/GI.   14. Anemia and leukopenia: Stable. Unclear etiology.  OP FU.   Procedures:  Right wrist fluid aspiration by Dr. [name] on 12/30/2012    Consultations:  Orthopedics.  Cardiology  Discharge Exam:  Complaints: Denies any complaints. States that she feels "great". No pain or symptoms in right had/wrist. Now declines SNF and wishes to go home with family.  Filed Vitals:   01/04/13 0400 01/04/13 0818 01/04/13 0955 01/04/13 1136  BP: 122/68 178/95 130/74 121/73  Pulse: 64 70  100  Temp: 98.1 F (36.7 C) 98.4 F (36.9 C)  98.8 F (37.1 C)  TempSrc: Oral Oral  Oral  Resp: 17 20  18   Height:      Weight:      SpO2: 97% 99%       General exam: Comfortable.   Respiratory system: Clear. No increased work of breathing.   Cardiovascular system: S1 & S2 heard, RRR. No JVD, murmurs, gallops, clicks or pedal edema. Telemetry: mostly sinus rhythm and occasional sinus bradycardia in the 50s.   Gastrointestinal system: Abdomen is nondistended, soft and non tender. Normal bowel sounds heard.   Central nervous system: Alert and oriented. No focal neurological deficits.   Extremities: Symmetric 5 x 5 power. Right wrist without any acute signs at this time. Has immobilizer on the wrist.   Discharge Instructions      Discharge Orders   Future Orders Complete By Expires     Call MD for:  extreme fatigue  As directed     Call MD for:  persistant dizziness or light-headedness  As directed     Call MD for:  redness, tenderness, or signs of infection (pain, swelling, redness, odor or green/yellow discharge around incision site)  As directed     Call MD for:  severe uncontrolled pain  As directed     Call MD for:  temperature >100.4  As directed     Diet - low sodium heart healthy  As directed     Increase activity slowly  As directed         Medication List    STOP taking these medications       potassium chloride 10 MEQ tablet  Commonly known as:  K-DUR,KLOR-CON      propranolol ER 80 MG 24 hr capsule  Commonly known as:  INDERAL LA      TAKE these medications       acetaminophen 325 MG tablet  Commonly known as:  TYLENOL  Take 2 tablets (650 mg total) by mouth every 6 (six) hours as needed for pain.     ALPRAZolam 0.5 MG tablet  Commonly known as:  XANAX  Take 0.5 mg by mouth 3 (three) times daily as needed for anxiety.     amLODipine 10 MG tablet  Commonly known as:  NORVASC  Take 1 tablet (10 mg total) by mouth daily.     aspirin EC 81 MG tablet  Take 81 mg by mouth daily.     desmopressin 0.2 MG tablet  Commonly known as:  DDAVP  Take 0.2 mg by mouth at bedtime.     feeding supplement Liqd  Take 237 mLs by mouth daily.     Fish Oil 1000 MG Caps  Take by mouth daily.     levothyroxine 125 MCG tablet  Commonly known as:  SYNTHROID, LEVOTHROID  Take 1 tablet (125 mcg total) by mouth daily  before breakfast.     lisinopril 10 MG tablet  Commonly known as:  PRINIVIL,ZESTRIL  Take 1 tablet (10 mg total) by mouth daily.     nicotine 14 mg/24hr patch  Commonly known as:  NICODERM CQ - dosed in mg/24 hours  Place 1 patch onto the skin daily.     pantoprazole 40 MG tablet  Commonly known as:  PROTONIX  Take 1 tablet (40 mg total) by mouth daily.     SUPER CALCIUM/D 600-125 MG-UNIT Tabs  Generic drug:  Calcium-Vitamin D  Take by mouth. Taking 2 daily       Follow-up Information   Follow up with [facility]. (Our office will call you for appointment for your stress test and followup. Call office if you have not heard from [place] within 3-4 days.)    Contact information:   [address] [place] [address] [phone]       Follow up with [name], MD. Schedule an appointment as soon as possible for a visit in 1 week. (To be seen with repeat labs (CBC).)    Contact information:   [address] [place] [address] [phone]        The results of significant diagnostics from this  hospitalization (including imaging, microbiology, ancillary and laboratory) are listed below for reference.    Significant Diagnostic Studies: Dg Clavicle Left  12/26/2012   *RADIOLOGY REPORT*  Clinical Data: Painful swollen sternoclavicular joint and shoulder pain  LEFT CLAVICLE - 2+ VIEWS  Comparison: Chest radiograph 10/22/2012  Findings: There is mild degenerative change at the acromioclavicular joint.  Artifact overlies the distal clavicle on one view.  There is no clavicular destruction or fracture.  Mild left glenohumeral joint degenerative change.  IMPRESSION: No acute osseous abnormality of the left clavicle.  If there is strong clinical suspicion for sternoclavicular osteomyelitis or other radiographically occult finding, consider CT of the upper chest with contrast for further evaluation (contrast should be injected in the contralateral right side).   Original Report Authenticated By: Christiana Pellant, M.D.   Dg Clavicle Right  12/26/2012   *RADIOLOGY REPORT*  Clinical Data: Pain and swelling of the sternoclavicular joint  RIGHT CLAVICLE - 2+ VIEWS  Comparison: The chest radiograph 10/22/2012  Findings: No clavicular fracture identified.  Mild right AC joint degenerative change.  Right lung apex is clear.  IMPRESSION: No acute abnormality.   Original Report Authenticated By: Christiana Pellant, M.D.   Dg Wrist Complete Right  12/30/2012   *RADIOLOGY REPORT*  Clinical Data: Right wrist pain.  Arthritis. No trauma history submitted.  RIGHT WRIST - COMPLETE 3+ VIEW  Comparison: 12/26/2012 from [facility]  Findings: Scaphoid intact. No acute fracture or dislocation. Redemonstration of advanced osteoarthritis involving the base of thumb and the radial aspect of the carpal bones.  There is also subchondral cyst formation within the triquetrum, likely degenerative.  IMPRESSION: Osteoarthritis, without acute osseous finding.   Original Report Authenticated By: Jeronimo Greaves, M.D.   Ct Chest W  Contrast  12/27/2012   *RADIOLOGY REPORT*  Clinical Data: Pain and inflammation in the region of the sternoclavicular joints.  Clinical concern for osteomyelitis.  CT CHEST WITH CONTRAST  Technique:  Multidetector CT imaging of the chest was performed following the standard protocol during bolus administration of intravenous contrast.  Contrast: 80mL OMNIPAQUE IOHEXOL 300 MG/ML  SOLN  Comparison: Bilateral clavicle radiographs obtained yesterday.  Findings: Subarticular cyst formation and bony irregularity on the manubrial side of  the right sternoclavicular joint.  These changes are well corticated.  Mild right inferior clavicle spur formation. Normal appearing left sternoclavicular joint.  No abnormal fluid collections.  Thoracic spine degenerative changes.  Mild bullous changes in both lungs, most pronounced in the upper lobes.  No lung masses or enlarged lymph nodes.  Unremarkable upper abdomen.  IMPRESSION:  1.  Right sternoclavicular osteoarthritis. 2.  No evidence of osteomyelitis. 3.  COPD with centrilobular and paraseptal emphysema.   Original Report Authenticated By: Beckie Salts, M.D.   Dg Chest Port 1 View  12/28/2012   *RADIOLOGY REPORT*  Clinical Data: Shortness of breath.  PORTABLE CHEST - 1 VIEW  Comparison: 10/22/2012.  Findings: The heart is borderline enlarged but stable.  Mild stable emphysematous changes and upper lobe scarring.  No infiltrates, edema or effusions.  IMPRESSION:  1.  Chronic lung changes but no acute pulmonary findings. 2.  Stable borderline cardiac enlargement.   Original Report Authenticated By: Rudie Meyer, M.D.   Dg Hand Complete Right  12/26/2012   *RADIOLOGY REPORT*  Clinical Data: Right hand pain and swelling and  RIGHT HAND - COMPLETE 3+ VIEW  Comparison: None.  Findings: There is no evidence of fracture or dislocation. Significant degenerative change is noted at the first carpometacarpal joint, with mild surrounding bone formation and joint space loss.  Remaining  visualized joint spaces are grossly preserved.  The carpal rows are grossly intact, and demonstrate normal alignment.  No significant soft tissue abnormalities are characterized on radiograph.  IMPRESSION:  1.  No evidence of fracture or dislocation. 2.  Significant osteoarthritis at the first carpometacarpal joint.   Original Report Authenticated By: Tonia Ghent, M.D.    Microbiology: Recent Results (from the past 240 hour(s))  CULTURE, BLOOD (ROUTINE X 2)     Status: None   Collection Time    12/26/12  7:39 PM      Result Value Range Status   Specimen Description BLOOD LEFT ANTECUBITAL   Final   Special Requests BOTTLES DRAWN AEROBIC AND ANAEROBIC 4CC   Final   Culture  Setup Time 12/29/2012 14:36   Final   Culture     Final   Value: STAPHYLOCOCCUS SPECIES (COAGULASE NEGATIVE)     Note: THE SIGNIFICANCE OF ISOLATING THIS ORGANISM FROM A SINGLE VENIPUNCTURE CANNOT BE PREDICTED WITHOUT FURTHER CLINICAL AND CULTURE CORRELATION. SUSCEPTIBILITIES AVAILABLE ONLY ON REQUEST.     Note: Gram Stain Report Called to,Read Back By and Verified With: [facility] 12/29/12 0600 RESSEGGER R  Performed at [facility]   Report Status 01/01/2013 FINAL   Final  CULTURE, BLOOD (ROUTINE X 2)     Status: None   Collection Time    12/26/12  7:48 PM      Result Value Range Status   Specimen Description BLOOD LEFT HAND   Final   Special Requests BOTTLES DRAWN AEROBIC AND ANAEROBIC 4CC   Final   Culture NO GROWTH 5 DAYS   Final   Report Status 12/31/2012 FINAL   Final  CLOSTRIDIUM DIFFICILE BY PCR     Status: None   Collection Time    12/29/12 10:31 PM      Result Value Range Status   C difficile by pcr NEGATIVE  NEGATIVE Final  BODY FLUID CULTURE     Status: None   Collection Time    12/30/12  6:25 PM      Result Value Range Status   Specimen Description SYNOVIAL RIGHT WRIST   Final  Special Requests NONE   Final   Gram Stain     Final   Value: ABUNDANT WBC PRESENT, PREDOMINANTLY PMN      NO ORGANISMS SEEN   Culture NO GROWTH 3 DAYS   Final   Report Status 01/03/2013 FINAL   Final  MRSA PCR SCREENING     Status: None   Collection Time    01/01/13  3:19 AM      Result Value Range Status   MRSA by PCR NEGATIVE  NEGATIVE Final   Comment:            The GeneXpert MRSA Assay (FDA     approved for NASAL specimens     only), is one component of a     comprehensive MRSA colonization     surveillance program. It is not     intended to diagnose MRSA     infection nor to guide or     monitor treatment for     MRSA infections.     Labs: Basic Metabolic Panel:  Recent Labs Lab 12/29/12 1133 12/30/12 0518 12/31/12 0518 01/01/13 0111 01/02/13 0515  NA 125* 126* 129* 133* 135  K 3.1* 3.2* 4.5 3.6 3.7  CL 89* 91* 96 95* 96  CO2 22 22 22 28 27   GLUCOSE 75 73 88 88 96  BUN 9 6 10 12 15   CREATININE 0.55 0.42* 0.54 0.55 0.59  CALCIUM 8.4 8.6 9.5 9.4 9.9   Liver Function Tests: No results found for this basename: AST, ALT, ALKPHOS, BILITOT, PROT, ALBUMIN,  in the last 168 hours No results found for this basename: LIPASE, AMYLASE,  in the last 168 hours No results found for this basename: AMMONIA,  in the last 168 hours CBC:  Recent Labs Lab 12/29/12 1133 12/30/12 0518 01/01/13 0111 01/02/13 0515  WBC 2.0* 2.0* 1.9* 3.3*  NEUTROABS  --   --  0.5*  --   HGB 10.0* 10.8* 11.8* 12.8  HCT 28.5* 31.3* 34.8* 38.0  MCV 86.1 84.8 88.5 88.8  PLT 183 189 220 236   Cardiac Enzymes:  Recent Labs Lab 12/31/12 1920 01/01/13 0111 01/01/13 0755 01/01/13 1220  TROPONINI <0.30 <0.30 <0.30 <0.30   BNP: BNP (last 3 results)  Recent Labs  01/01/13 0111  PROBNP 582.3*   CBG:  Recent Labs Lab 12/29/12 0717 12/31/12 1105 01/01/13 1721 01/03/13 0019  GLUCAP 87 85 92 98    Additional labs:  Serum Osm: 254. Urine Osmolarity: 116. Urine Na: 16  TSH: 11.793 &12.303, Free T3: 1.53 & 2, Total T4: 11.6  A1C: 5.2  ANA: Negative.   Cyclic Citrullin Peptide Ab: <  2  RF: 86   Signed:  Monta Police  [facility] 01/04/2013, 3:03 PM

## 2013-01-07 ENCOUNTER — Encounter: Payer: Self-pay | Admitting: *Deleted

## 2013-01-11 ENCOUNTER — Encounter: Payer: Self-pay | Admitting: Cardiology

## 2013-02-02 ENCOUNTER — Ambulatory Visit: Payer: Self-pay | Admitting: Oncology

## 2013-02-02 DIAGNOSIS — D649 Anemia, unspecified: Secondary | ICD-10-CM

## 2013-02-02 DIAGNOSIS — Z9289 Personal history of other medical treatment: Secondary | ICD-10-CM

## 2013-02-02 HISTORY — DX: Anemia, unspecified: D64.9

## 2013-02-02 HISTORY — DX: Personal history of other medical treatment: Z92.89

## 2013-02-22 ENCOUNTER — Inpatient Hospital Stay: Payer: Self-pay | Admitting: Internal Medicine

## 2013-02-22 LAB — COMPREHENSIVE METABOLIC PANEL
Albumin: 3.4 g/dL (ref 3.4–5.0)
Anion Gap: 4 — ABNORMAL LOW (ref 7–16)
BUN: 19 mg/dL — ABNORMAL HIGH (ref 7–18)
Bilirubin,Total: 0.2 mg/dL (ref 0.2–1.0)
Calcium, Total: 8.7 mg/dL (ref 8.5–10.1)
Chloride: 106 mmol/L (ref 98–107)
Co2: 26 mmol/L (ref 21–32)
Creatinine: 0.79 mg/dL (ref 0.60–1.30)
EGFR (Non-African Amer.): >60
Glucose: 83 mg/dL (ref 65–99)
Osmolality: 273 (ref 275–301)
SGOT(AST): 64 U/L — ABNORMAL HIGH (ref 15–37)
SGPT (ALT): 60 U/L (ref 12–78)
Total Protein: 7.6 g/dL (ref 6.4–8.2)

## 2013-02-22 LAB — URINALYSIS, COMPLETE
Glucose,UR: NEGATIVE mg/dL (ref 0–75)
Ketone: NEGATIVE
Ph: 6 (ref 4.5–8.0)
Protein: NEGATIVE
RBC,UR: <1 /HPF (ref 0–5)
Squamous Epithelial: <1
WBC UR: <1 /HPF (ref 0–5)

## 2013-02-22 LAB — TROPONIN I: Troponin-I: <0.02 ng/mL

## 2013-02-22 LAB — CBC
HCT: 19.9 % — ABNORMAL LOW (ref 35.0–47.0)
HGB: 6.3 g/dL — ABNORMAL LOW (ref 12.0–16.0)
MCH: 27.9 pg (ref 26.0–34.0)
MCHC: 31.9 g/dL — ABNORMAL LOW (ref 32.0–36.0)
MCV: 88 fL (ref 80–100)
Platelet: 195 10*3/uL (ref 150–440)
WBC: 3.8 10*3/uL (ref 3.6–11.0)

## 2013-02-22 LAB — FOLATE: Folic Acid: 17.2 ng/mL (ref 3.1–100.0)

## 2013-02-22 LAB — IRON AND TIBC: Iron: 29 ug/dL — ABNORMAL LOW (ref 50–170)

## 2013-02-22 LAB — FERRITIN: Ferritin (ARMC): 25 ng/mL (ref 8–388)

## 2013-02-22 LAB — CK TOTAL AND CKMB (NOT AT ARMC): CK, Total: 244 U/L — ABNORMAL HIGH (ref 21–215)

## 2013-02-23 LAB — CBC WITH DIFFERENTIAL/PLATELET
Basophil #: 0 10*3/uL (ref 0.0–0.1)
Basophil %: 0.6 %
Eosinophil %: 0.5 %
HCT: 21.3 % — ABNORMAL LOW (ref 35.0–47.0)
Lymphocyte #: 0.9 10*3/uL — ABNORMAL LOW (ref 1.0–3.6)
MCH: 28.3 pg (ref 26.0–34.0)
MCHC: 33.4 g/dL (ref 32.0–36.0)
MCV: 85 fL (ref 80–100)
Monocyte #: 1.3 x10 3/mm — ABNORMAL HIGH (ref 0.2–0.9)
Monocyte %: 36.2 %
RBC: 2.51 10*6/uL — ABNORMAL LOW (ref 3.80–5.20)
RDW: 18.8 % — ABNORMAL HIGH (ref 11.5–14.5)
WBC: 3.5 10*3/uL — ABNORMAL LOW (ref 3.6–11.0)

## 2013-02-23 LAB — BASIC METABOLIC PANEL
Anion Gap: 4 — ABNORMAL LOW (ref 7–16)
Calcium, Total: 8.6 mg/dL (ref 8.5–10.1)
Chloride: 108 mmol/L — ABNORMAL HIGH (ref 98–107)
Creatinine: 0.58 mg/dL — ABNORMAL LOW (ref 0.60–1.30)
EGFR (African American): >60
Glucose: 78 mg/dL (ref 65–99)
Osmolality: 277 (ref 275–301)
Sodium: 139 mmol/L (ref 136–145)

## 2013-02-24 LAB — CBC WITH DIFFERENTIAL/PLATELET
Basophil %: 0.5 %
Eosinophil %: 0.4 %
HCT: 26.1 % — ABNORMAL LOW (ref 35.0–47.0)
HGB: 8.9 g/dL — ABNORMAL LOW (ref 12.0–16.0)
Lymphocyte %: 25.5 %
Monocyte #: 1.7 x10 3/mm — ABNORMAL HIGH (ref 0.2–0.9)
Neutrophil #: 1.8 10*3/uL (ref 1.4–6.5)
Neutrophil %: 37.8 %
RBC: 3.04 10*6/uL — ABNORMAL LOW (ref 3.80–5.20)
RDW: 17.7 % — ABNORMAL HIGH (ref 11.5–14.5)
WBC: 4.7 10*3/uL (ref 3.6–11.0)

## 2013-03-05 ENCOUNTER — Ambulatory Visit: Payer: Self-pay | Admitting: Oncology

## 2013-03-09 ENCOUNTER — Emergency Department: Payer: Self-pay | Admitting: Emergency Medicine

## 2013-03-09 LAB — URINALYSIS, COMPLETE
Bacteria: NONE SEEN
Bilirubin,UR: NEGATIVE
Glucose,UR: NEGATIVE mg/dL (ref 0–75)
Leukocyte Esterase: NEGATIVE
Nitrite: NEGATIVE
Ph: 6 (ref 4.5–8.0)
Protein: NEGATIVE
Specific Gravity: 1.008 (ref 1.003–1.030)

## 2013-03-09 LAB — CBC
HCT: 32.9 % — ABNORMAL LOW (ref 35.0–47.0)
HGB: 11 g/dL — ABNORMAL LOW (ref 12.0–16.0)
MCH: 29.4 pg (ref 26.0–34.0)
MCHC: 33.3 g/dL (ref 32.0–36.0)
MCV: 88 fL (ref 80–100)
Platelet: 134 10*3/uL — ABNORMAL LOW (ref 150–440)
RBC: 3.73 10*6/uL — ABNORMAL LOW (ref 3.80–5.20)

## 2013-03-09 LAB — BASIC METABOLIC PANEL
Creatinine: 0.44 mg/dL — ABNORMAL LOW (ref 0.60–1.30)
EGFR (African American): >60
EGFR (Non-African Amer.): >60
Glucose: 79 mg/dL (ref 65–99)
Osmolality: 270 (ref 275–301)
Potassium: 3.9 mmol/L (ref 3.5–5.1)
Sodium: 136 mmol/L (ref 136–145)

## 2013-03-09 LAB — TROPONIN I
Troponin-I: <0.02 ng/mL
Troponin-I: <0.02 ng/mL

## 2013-03-10 DIAGNOSIS — E039 Hypothyroidism, unspecified: Secondary | ICD-10-CM | POA: Insufficient documentation

## 2013-03-18 ENCOUNTER — Encounter: Payer: Self-pay | Admitting: Adult Health

## 2013-03-18 ENCOUNTER — Ambulatory Visit (INDEPENDENT_AMBULATORY_CARE_PROVIDER_SITE_OTHER): Payer: Medicare Other | Admitting: Adult Health

## 2013-03-18 VITALS — BP 156/66 | HR 68 | Temp 97.8°F | Resp 12 | Ht 62.0 in | Wt 122.0 lb

## 2013-03-18 DIAGNOSIS — D638 Anemia in other chronic diseases classified elsewhere: Secondary | ICD-10-CM

## 2013-03-18 DIAGNOSIS — I10 Essential (primary) hypertension: Secondary | ICD-10-CM

## 2013-03-18 DIAGNOSIS — Z87891 Personal history of nicotine dependence: Secondary | ICD-10-CM

## 2013-03-18 DIAGNOSIS — M2550 Pain in unspecified joint: Secondary | ICD-10-CM

## 2013-03-18 DIAGNOSIS — D649 Anemia, unspecified: Secondary | ICD-10-CM

## 2013-03-18 DIAGNOSIS — E039 Hypothyroidism, unspecified: Secondary | ICD-10-CM

## 2013-03-18 NOTE — Patient Instructions (Addendum)
   Thank you for choosing [place] for your health care needs.  Please have your labs drawn prior to leaving the office.  The results will be available through MyChart for your convenience. Please remember to activate this. The activation code is located at the end of this form.  We can also call you with the results if you prefer.  I will request your medical records from previous medical providers.  Please feel free to call with any questions or concerns.

## 2013-03-18 NOTE — Progress Notes (Signed)
Subjective:    Patient ID: Caroline Nichols, female    DOB: [birth year], 72 y.o.   MRN: [id number]  HPI  Caroline Nichols is a pleasant 72 y/o female who presents to clinic to establish care. She recently moved from her home in [place], [place] to [facility] and, therefore, too far to continue to visit her previous PCP, Dr. [name], in [place]. Also, patient was recently hospitalized at [facility] from 02/22/13 to 02/24/13 for severe anemia. She is s/p transfusion x 3 units PRBCs. Hospital w/u revealed AOCD, iron deficiency. Previous to hospitalization, pt had GI w/u with endoscopy and colonoscopy. EGD was negative; however, colonoscopy showed rectal tear s/p clips and resolution of problem. I will request medical records from previous PCP, consultations and recent hospitalization.     Past Medical History  Diagnosis Date  . Hypertension   . Hyperthyroidism     s/p radiation therapy, now hypothyroidism  . IBS (irritable bowel syndrome)   . Herniated disc   . Pancreatic cyst   . Arthritis     Possible RA - Follow up appt with Dr. Gavin Potters  . H/O transfusion of packed red blood cells 02/2013    3 units PRBC for severe anemia 02/2013  . Anemia 02/2013    F/U with Dr. Orlie Dakin for Feraheme injections  . Hyperlipidemia   . Allergy   . History of kidney stones   . Migraine   . Cellulitis of arm, right   . Paroxysmal a-fib   . Diabetes insipidus     On desmopressin     Past Surgical History  Procedure Laterality Date  . Tubal ligation    . Cervical spine surgery      Bone fusion  . Hammer toe surgery    . Hernia repair    . Abdominal hysterectomy    . Breast surgery  1990s    Biopsy     Family History  Problem Relation Age of [place]  . Heart disease Mother   . Heart disease Father   . Diabetes Brother   . Kidney disease Brother   . Stroke Daughter     due to medication reaction     History   Social History  . Marital Status: Widowed    Spouse Name: N/A    Number of  Children: 4  . Years of Education: 12   Occupational History  . Textiles     Retired  . [employer]     Retired   Social History Main Topics  . Smoking status: Former Smoker -- 0.50 packs/day for 30 years    Types: Cigarettes    Quit date: 12/03/2012  . Smokeless tobacco: Never Used  . Alcohol Use: No  . Drug Use: No  . Sexual Activity: No   Other Topics Concern  . Not on file   Social History Narrative   Caroline Nichols was born and reared in [place]. She is a widow since 18. She was married for 48 years. They had 4 children (daughters). She is currently leaving at [facility] since June. 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 She enjoys shopping. She also enjoys music, word puzzles and she loves spending time with her family. She loves attending church. One of her favorite things is wearing hats. She absolutely loves hats!     Review of Systems  Constitutional: Negative for fatigue.  Respiratory: Negative.   Cardiovascular: Negative.   Gastrointestinal: Negative for vomiting, abdominal pain, diarrhea, constipation and blood in stool.       Nausea with iron supplement. Better if takes with food.  Genitourinary: Negative.   Musculoskeletal: Positive for arthralgias and gait problem.       Using walker for balance. Multiple joints with pain, decreased ROM bil upper extremities. Inability to fully raise arms above her head.  Allergic/Immunologic:       Seasonal allergies  Neurological: Positive for weakness and headaches. Negative for dizziness and light-headedness.       Occasional headaches  Psychiatric/Behavioral: Negative for confusion. The patient is nervous/anxious.        No feeling or hx of depression.    BP 156/66  Pulse 68  Temp(Src) 97.8 F (36.6 C) (Oral)  Resp 12  Ht 5\' 2"  (1.575 m)  Wt 122 lb (55.339 kg)  BMI 22.31 kg/m2  SpO2 99%    Objective:    Physical Exam  Constitutional: She is oriented to person, place, and time. She appears well-developed and well-nourished. No distress.  HENT:  Head: Normocephalic and atraumatic.  Right Ear: External ear normal.  Left Ear: External ear normal.  Nose: Nose normal.  Mouth/Throat: Oropharynx is clear and moist.  Edentulous. Upper and lower dentures in place.  Eyes: Conjunctivae and EOM are normal. Pupils are equal, round, and reactive to light.  Neck: Normal range of motion. Neck supple. No tracheal deviation present. No thyromegaly present.  Cardiovascular: Normal rate, regular rhythm, S1 normal, S2 normal, normal heart sounds and intact distal pulses.  Exam reveals no gallop.   No murmur heard. Pulmonary/Chest: Effort normal and breath sounds normal. No respiratory distress. She has no wheezes. She has no rales.  Abdominal: Soft. Bowel sounds are normal. She exhibits no distension. There is no tenderness. There is no rebound and no guarding.  Musculoskeletal: She exhibits no edema and no tenderness.       Right hand: Normal sensation noted. Decreased strength noted.       Left hand: Normal sensation noted. Decreased strength noted.       Right upper leg: Normal. She exhibits no swelling, no edema and no deformity.       Left upper leg: Normal. She exhibits no swelling, no edema and no deformity.  Bilateral upper extremity strength 3/5; bilateral lower extremity strength 4/5. Good "get up and go". Steady gait. Decreased ROM bilateral upper extremities.  Neurological: She is alert and oriented to person, place, and time. Coordination normal.  Good balance.   Skin: Skin is warm and dry.  Psychiatric: She has a normal mood and affect. Her behavior is normal. Judgment and thought content normal.      Assessment & Plan:

## 2013-03-19 LAB — CBC WITH DIFFERENTIAL/PLATELET
Basophils Absolute: 0 10*3/uL (ref 0.0–0.1)
HCT: 33.8 % — ABNORMAL LOW (ref 36.0–46.0)
Lymphs Abs: 1.2 10*3/uL (ref 0.7–4.0)
MCV: 89.2 fl (ref 78.0–100.0)
Monocytes Absolute: 0.1 10*3/uL (ref 0.1–1.0)
Neutro Abs: 9.3 10*3/uL — ABNORMAL HIGH (ref 1.4–7.7)
Platelets: 365 10*3/uL (ref 150.0–400.0)
RDW: 20.2 % — ABNORMAL HIGH (ref 11.5–14.6)

## 2013-03-19 LAB — BASIC METABOLIC PANEL
Calcium: 9.3 mg/dL (ref 8.4–10.5)
Creatinine, Ser: 0.7 mg/dL (ref 0.4–1.2)
GFR: 102.49 mL/min (ref >60.00)
Glucose, Bld: 139 mg/dL — ABNORMAL HIGH (ref 70–99)
Sodium: 135 mEq/L (ref 135–145)

## 2013-03-20 ENCOUNTER — Telehealth: Payer: Self-pay | Admitting: Adult Health

## 2013-03-20 ENCOUNTER — Encounter: Payer: Self-pay | Admitting: Adult Health

## 2013-03-20 DIAGNOSIS — Z87891 Personal history of nicotine dependence: Secondary | ICD-10-CM | POA: Insufficient documentation

## 2013-03-20 DIAGNOSIS — E039 Hypothyroidism, unspecified: Secondary | ICD-10-CM | POA: Insufficient documentation

## 2013-03-20 DIAGNOSIS — M2550 Pain in unspecified joint: Secondary | ICD-10-CM | POA: Insufficient documentation

## 2013-03-20 DIAGNOSIS — D61818 Other pancytopenia: Secondary | ICD-10-CM | POA: Insufficient documentation

## 2013-03-20 NOTE — Assessment & Plan Note (Signed)
Decreased ROM bilateral UE and pain in multiple joints. Pt has appt with Dr. Gavin Potters for evaluation possible RA.

## 2013-03-20 NOTE — Assessment & Plan Note (Addendum)
Pt is s/p 3 units PRBCs during hospitalization 7/21- 7/23. Will check her cbc. She is scheduled to follow up with Dr. Orlie Dakin who saw her during hospitalization.

## 2013-03-20 NOTE — Assessment & Plan Note (Signed)
Patient currently on lisinopril and amlodipine. Continue to follow. Check bmet.

## 2013-03-20 NOTE — Assessment & Plan Note (Signed)
Continue levothyroxine 125 mcg daily. Request records from endocrinology. Patient with f/u appt soon.

## 2013-03-20 NOTE — Telephone Encounter (Signed)
Discharge summary reviewed from recent hospitalization. ASA was discontinued given severe anemia. Noted on her medication list. Please have patient stop taking ASA at this time.

## 2013-03-20 NOTE — Assessment & Plan Note (Signed)
Recently quit smoking. Continue to encourage remaining smoke free.

## 2013-03-20 NOTE — Assessment & Plan Note (Deleted)
Recently quit smoking. Continue to encourage remaining smoke free.  

## 2013-03-22 NOTE — Telephone Encounter (Signed)
Left message for pt to return my call.

## 2013-03-23 ENCOUNTER — Ambulatory Visit: Payer: Medicare Other | Admitting: Cardiology

## 2013-03-23 NOTE — Telephone Encounter (Signed)
Pt notified to stop ASA,verbalized understanding 

## 2013-03-26 ENCOUNTER — Encounter: Payer: Self-pay | Admitting: Cardiovascular Disease

## 2013-03-26 ENCOUNTER — Ambulatory Visit (INDEPENDENT_AMBULATORY_CARE_PROVIDER_SITE_OTHER): Payer: Medicare Other | Admitting: Cardiovascular Disease

## 2013-03-26 VITALS — BP 120/62 | HR 79 | Ht 62.0 in | Wt 124.4 lb

## 2013-03-26 DIAGNOSIS — R079 Chest pain, unspecified: Secondary | ICD-10-CM

## 2013-03-26 DIAGNOSIS — I10 Essential (primary) hypertension: Secondary | ICD-10-CM

## 2013-03-26 DIAGNOSIS — D638 Anemia in other chronic diseases classified elsewhere: Secondary | ICD-10-CM

## 2013-03-26 DIAGNOSIS — R0789 Other chest pain: Secondary | ICD-10-CM

## 2013-03-26 DIAGNOSIS — R0989 Other specified symptoms and signs involving the circulatory and respiratory systems: Secondary | ICD-10-CM

## 2013-03-26 DIAGNOSIS — F17200 Nicotine dependence, unspecified, uncomplicated: Secondary | ICD-10-CM

## 2013-03-26 HISTORY — DX: Chest pain, unspecified: R07.9

## 2013-03-26 MED ORDER — NITROGLYCERIN 0.4 MG SL SUBL: 0.4000 mg | SUBLINGUAL_TABLET | SUBLINGUAL | Status: DC | PRN

## 2013-03-26 NOTE — Assessment & Plan Note (Signed)
One episode of chest pain on 03/09/2013. Resolved without significant intervention. Workup in the hospital was negative. No further episodes since then despite aggressive walking regiment. We have discussed the various treatment options with her. We'll provide her with nitroglycerin for any recurrent pain. She would like to hold off on any stress testing at this time. She had previous severe reaction to a "stress test". Suspect she did not do well on a treadmill study. We did explain that we have lexiscan. We will reserve this for any recurrent pain. She is at high risk given her long smoking history.

## 2013-03-26 NOTE — Progress Notes (Signed)
Patient ID: Caroline Nichols, female    DOB: 26-Dec-1940, 72 y.o.   MRN: [id number]  HPI Comments: 72 yo woman prior long history of smoking, IBS, hypertension, anemia with prior blood transfusions , hospitalized at [facility] from 02/22/13 to 02/24/13 for severe anemia, transfusion x 3 units PRBCs. Hospital w/u revealed AOCD, iron deficiency. Previous to hospitalization, pt had GI w/u with endoscopy and colonoscopy. EGD was negative; however, colonoscopy showed rectal tear s/p clips and resolution of problem.   She reports having recent evaluation at the hospital August 5 for chest pain. She had CT scan of the chest for elevated d-dimer and pleuritic chest pain. No PE noted CT scan of the chest did not make any mention of atherosclerotic disease she described her pain as acute, like a"gas" pain. She has never had pain like this before. She does report having a hiatal hernia the details are uncertain. Pain presented before lunchtime, while she was waiting to eat. Symptoms seemed to resolve in the emergency room.    Since then she has been walking once, sometimes twice a day for exercise morning and evening with other family members. She has a relatively high exercise tolerance. No reproducible chest pain with exertion  In may 2014, she had admission for cellulitis requiring long course of antibiotic  She was told that she had an arrhythmia in the hospital EKG shows normal sinus rhythm with rare APCs  EKG today shows normal sinus rhythm with PVCs, rate 79 beats per minute, no significant ST or T wave changes     Past Medical History . Hypertension  . Hyperthyroidism    s/p radiation therapy, now hypothyroidism . IBS (irritable bowel syndrome)  . Herniated disc  . Pancreatic cyst  . Arthritis    Possible RA - Follow up appt with Dr. Gavin Potters . H/O transfusion of packed red blood cells 02/2013   3 units PRBC for severe anemia 02/2013 . Anemia 02/2013   F/U with Dr. Orlie Dakin for Feraheme  injections . Hyperlipidemia  . Allergy  . History of kidney stones  . Migraine  . Cellulitis of arm, right  . Diabetes insipidus    On desmopressin    Outpatient Encounter Prescriptions as of 03/26/2013  Medication Sig Dispense Refill  . acetaminophen (TYLENOL) 325 MG tablet Take 2 tablets (650 mg total) by mouth every 6 (six) hours as needed for pain.      Marland Kitchen ALPRAZolam (XANAX) 0.5 MG tablet Take 0.5 mg by mouth 3 (three) times daily as needed for anxiety.       Marland Kitchen amLODipine (NORVASC) 10 MG tablet Take 1 tablet (10 mg total) by mouth daily.  30 tablet  0  . aspirin EC 81 MG tablet Take 81 mg by mouth daily.      . Calcium-Vitamin D (SUPER CALCIUM/D) 600-125 MG-UNIT TABS Take by mouth. Taking 2 daily      . desmopressin (DDAVP) 0.2 MG tablet Take 0.1 mg by mouth at bedtime.       . feeding supplement (GLUCERNA SHAKE) LIQD Take 237 mLs by mouth daily.      Marland Kitchen levothyroxine (SYNTHROID, LEVOTHROID) 125 MCG tablet Take 1 tablet (125 mcg total) by mouth daily before breakfast.  30 tablet  0  . lisinopril (PRINIVIL,ZESTRIL) 10 MG tablet Take 1 tablet (10 mg total) by mouth daily.  30 tablet  0  . Omega-3 Fatty Acids (FISH OIL) 1000 MG CAPS Take by mouth daily.      . pantoprazole (PROTONIX) 40  MG tablet Take 1 tablet (40 mg total) by mouth daily.  30 tablet  0  . predniSONE (DELTASONE) 20 MG tablet Take 20 mg by mouth daily.       . traMADol (ULTRAM) 50 MG tablet Take 50 mg by mouth every 4 (four) hours as needed.       . vitamin C (ASCORBIC ACID) 500 MG tablet Take 500 mg by mouth daily.        Review of Systems  Constitutional: Negative.   HENT: Negative.   Eyes: Negative.   Respiratory: Negative.   Cardiovascular: Negative.   Gastrointestinal: Negative.   Musculoskeletal: Negative.   Skin: Negative.   Neurological: Negative.   Psychiatric/Behavioral: Negative.   All other systems reviewed and are negative.    BP 120/62  Pulse 79  Ht 5\' 2"  (1.575 m)  Wt 124 lb 6.4 oz (56.427  kg)  BMI 22.75 kg/m2   Physical Exam  Nursing note and vitals reviewed. Constitutional: She is oriented to person, place, and time. She appears well-developed and well-nourished.  HENT:  Head: Normocephalic.  Nose: Nose normal.  Mouth/Throat: Oropharynx is clear and moist.  Eyes: Conjunctivae are normal. Pupils are equal, round, and reactive to light.  Neck: Normal range of motion. Neck supple. No JVD present.  Cardiovascular: Normal rate, regular rhythm, S1 normal, S2 normal, normal heart sounds and intact distal pulses.  Exam reveals no gallop and no friction rub.   No murmur heard. Pulmonary/Chest: Effort normal and breath sounds normal. No respiratory distress. She has no wheezes. She has no rales. She exhibits no tenderness.  Abdominal: Soft. Bowel sounds are normal. She exhibits no distension. There is no tenderness.  Musculoskeletal: Normal range of motion. She exhibits no edema and no tenderness.  Lymphadenopathy:    She has no cervical adenopathy.  Neurological: She is alert and oriented to person, place, and time. Coordination normal.  Skin: Skin is warm and dry. No rash noted. No erythema.  Psychiatric: She has a normal mood and affect. Her behavior is normal. Judgment and thought content normal.    Assessment and Plan

## 2013-03-26 NOTE — Assessment & Plan Note (Signed)
She stopped smoking in May 2014

## 2013-03-26 NOTE — Assessment & Plan Note (Signed)
1+ carotid bruit on the left. Likely from underlying carotid arterial disease. We'll discuss carotid ultrasound on her next visit. Likely mild to moderate in nature given the intensity.

## 2013-03-26 NOTE — Patient Instructions (Addendum)
You are doing well. Take nitro for chest pain  Call the office if you have more chest pain episodes  Please call us if you have new issues that need to be addressed before your next appt.  Your physician wants you to follow-up in: 6 months.  You will receive a reminder letter in the mail two months in advance. If you don't receive a letter, please call our office to schedule the follow-up appointment.

## 2013-03-26 NOTE — Assessment & Plan Note (Signed)
Blood pressure is well controlled on today's visit. No changes made to the medications. 

## 2013-03-26 NOTE — Assessment & Plan Note (Signed)
History of chronic anemia requiring frequent blood transfusions. She is followed by hematology.

## 2013-04-22 ENCOUNTER — Encounter: Payer: Self-pay | Admitting: Adult Health

## 2013-04-22 ENCOUNTER — Ambulatory Visit (INDEPENDENT_AMBULATORY_CARE_PROVIDER_SITE_OTHER): Payer: Medicare Other | Admitting: Adult Health

## 2013-04-22 VITALS — BP 128/70 | HR 82 | Temp 98.0°F | Resp 12 | Ht 62.0 in | Wt 133.0 lb

## 2013-04-22 DIAGNOSIS — M2550 Pain in unspecified joint: Secondary | ICD-10-CM

## 2013-04-22 DIAGNOSIS — D649 Anemia, unspecified: Secondary | ICD-10-CM

## 2013-04-22 DIAGNOSIS — D509 Iron deficiency anemia, unspecified: Secondary | ICD-10-CM

## 2013-04-22 DIAGNOSIS — I10 Essential (primary) hypertension: Secondary | ICD-10-CM

## 2013-04-22 LAB — IRON: Iron: 143 ug/dL (ref 42–145)

## 2013-04-22 LAB — CBC WITH DIFFERENTIAL/PLATELET
Basophils Absolute: 0 10*3/uL (ref 0.0–0.1)
Eosinophils Absolute: 0 10*3/uL (ref 0.0–0.7)
Hemoglobin: 11.5 g/dL — ABNORMAL LOW (ref 12.0–15.0)
Lymphocytes Relative: 24 % (ref 12.0–46.0)
Monocytes Relative: 9.5 % (ref 3.0–12.0)
Neutro Abs: 3.5 10*3/uL (ref 1.4–7.7)
Neutrophils Relative %: 66.2 % (ref 43.0–77.0)
RDW: 19.4 % — ABNORMAL HIGH (ref 11.5–14.6)

## 2013-04-22 LAB — VITAMIN B12: Vitamin B-12: 913 pg/mL — ABNORMAL HIGH (ref 211–911)

## 2013-04-22 LAB — FERRITIN: Ferritin: 58.9 ng/mL (ref 10.0–291.0)

## 2013-04-22 NOTE — Assessment & Plan Note (Signed)
She saw Dr. Gavin Potters who has started her on methotrexate, prednisone. Symptoms improved. She has a follow up appt with him next month.

## 2013-04-22 NOTE — Progress Notes (Signed)
Subjective:    Patient ID: Caroline Nichols, female    DOB: 04-14-1941, 72 y.o.   MRN: [id number]  HPI  Patient is a pleasant 72 year old female who presents to clinic for followup of hypertension and anemia. She was asked to stop her aspirin secondary to severe anemia. She denies any blood in her stool, although she reports stools are black. The patient is on iron supplement. She reports taking her medication exactly as prescribed. Overall, she is feeling extremely well. She is in good spirits and very jolly. She has seen Dr. Gavin Potters for polyarthralgia and has been started her on methotrexate and prednisone. She reports having a followup appointment with Dr. Gavin Potters next month. As to her hypertension, blood pressure is very well controlled on current medication regimen. She is walking daily. Has nitroglycerin tablets in the event of angina. She reports not having to use medication.   Current Outpatient Prescriptions on File Prior to Visit  Medication Sig Dispense Refill  . acetaminophen (TYLENOL) 325 MG tablet Take 2 tablets (650 mg total) by mouth every 6 (six) hours as needed for pain.      Marland Kitchen ALPRAZolam (XANAX) 0.5 MG tablet Take 0.5 mg by mouth 3 (three) times daily as needed for anxiety.       Marland Kitchen amLODipine (NORVASC) 10 MG tablet Take 1 tablet (10 mg total) by mouth daily.  30 tablet  0  . aspirin EC 81 MG tablet Take 81 mg by mouth daily.      . Calcium-Vitamin D (SUPER CALCIUM/D) 600-125 MG-UNIT TABS Take by mouth. Taking 2 daily      . desmopressin (DDAVP) 0.2 MG tablet Take 0.1 mg by mouth at bedtime.       . feeding supplement (GLUCERNA SHAKE) LIQD Take 237 mLs by mouth daily.      Marland Kitchen lisinopril (PRINIVIL,ZESTRIL) 10 MG tablet Take 1 tablet (10 mg total) by mouth daily.  30 tablet  0  . nitroGLYCERIN (NITROSTAT) 0.4 MG SL tablet Place 1 tablet (0.4 mg total) under the tongue every 5 (five) minutes as needed.  25 tablet  3  . Omega-3 Fatty Acids (FISH OIL) 1000 MG CAPS Take by mouth daily.       . pantoprazole (PROTONIX) 40 MG tablet Take 1 tablet (40 mg total) by mouth daily.  30 tablet  0  . traMADol (ULTRAM) 50 MG tablet Take 50 mg by mouth every 4 (four) hours as needed.       . vitamin C (ASCORBIC ACID) 500 MG tablet Take 500 mg by mouth daily.       No current facility-administered medications on file prior to visit.     Review of Systems  Constitutional: Negative.   HENT: Negative.   Respiratory: Negative.   Cardiovascular: Negative.   Gastrointestinal: Negative for nausea, vomiting, abdominal pain, diarrhea, constipation and blood in stool.  Genitourinary: Negative.   Neurological: Negative.   Psychiatric/Behavioral: Negative.   All other systems reviewed and are negative.      Objective:   Physical Exam  Constitutional: She is oriented to person, place, and time. She appears well-developed and well-nourished. No distress.  HENT:  Head: Normocephalic and atraumatic.  Cardiovascular: Normal rate, regular rhythm and normal heart sounds.  Exam reveals no gallop and no friction rub.   No murmur heard. Pulmonary/Chest: Effort normal and breath sounds normal. No respiratory distress. She has no wheezes. She has no rales.  Abdominal: Soft. Bowel sounds are normal.  Musculoskeletal: Normal range of motion.  Lymphadenopathy:    She has no cervical adenopathy.  Neurological: She is alert and oriented to person, place, and time.  Skin: Skin is warm and dry.  Psychiatric: She has a normal mood and affect. Her behavior is normal. Judgment and thought content normal.   BP 128/70  Pulse 82  Temp(Src) 98 F (36.7 C) (Oral)  Resp 12  Ht 5\' 2"  (1.575 m)  Wt 133 lb (60.328 kg)  BMI 24.32 kg/m2  SpO2 98%     Assessment & Plan:

## 2013-04-22 NOTE — Assessment & Plan Note (Addendum)
Blood pressure well controlled. B/P 128/70. Continue current medication regimen. Continue to follow.

## 2013-04-22 NOTE — Assessment & Plan Note (Signed)
Denies weakness or fatigue. Check cbc, iron, ferritin. Continue to follow.

## 2013-06-21 ENCOUNTER — Telehealth: Payer: Self-pay | Admitting: Adult Health

## 2013-06-21 NOTE — Telephone Encounter (Signed)
The patient is wanting a referral to an Endocrinologist.

## 2013-06-21 NOTE — Telephone Encounter (Signed)
LM, requesting why needing endocrinologist referral (thyroid?), if she is currently seeing one? Requested call back.

## 2013-06-23 NOTE — Telephone Encounter (Signed)
Left message for pt to return my call.

## 2013-06-24 ENCOUNTER — Other Ambulatory Visit: Payer: Self-pay | Admitting: Adult Health

## 2013-06-24 DIAGNOSIS — E039 Hypothyroidism, unspecified: Secondary | ICD-10-CM

## 2013-06-24 NOTE — Telephone Encounter (Signed)
Pt's daugher, Toniann Fail, returned call. Pt currently seeing [facility] endocrinologist, but would like more local, [place] endocrinologist for continuity of care. Requests [facility] office. States has had issues in the past with multiple doctors monitoring and changing meds often. Advised once referral placed, we would contact them with an appointment.

## 2013-06-25 ENCOUNTER — Encounter: Payer: Self-pay | Admitting: Emergency Medicine

## 2013-08-02 ENCOUNTER — Ambulatory Visit: Payer: Self-pay | Admitting: Oncology

## 2013-08-02 ENCOUNTER — Ambulatory Visit: Payer: Self-pay | Admitting: Hematology and Oncology

## 2013-08-02 LAB — IRON AND TIBC
Iron Bind.Cap.(Total): 331 ug/dL (ref 250–450)
Iron Saturation: 12 %
Iron: 41 ug/dL — ABNORMAL LOW (ref 50–170)
Unbound Iron-Bind.Cap.: 290 ug/dL

## 2013-08-02 LAB — CBC CANCER CENTER
Basophil %: 0.7 %
HCT: 34.8 % — ABNORMAL LOW (ref 35.0–47.0)
Lymphocyte #: 0.9 x10 3/mm — ABNORMAL LOW (ref 1.0–3.6)
Lymphocyte %: 17.7 %
MCHC: 33.4 g/dL (ref 32.0–36.0)
MCV: 89 fL (ref 80–100)
Monocyte #: 2 x10 3/mm — ABNORMAL HIGH (ref 0.2–0.9)
Monocyte %: 38.2 %
Neutrophil #: 2.3 x10 3/mm (ref 1.4–6.5)
Neutrophil %: 43.1 %
RBC: 3.9 10*6/uL (ref 3.80–5.20)
RDW: 15.1 % — ABNORMAL HIGH (ref 11.5–14.5)
WBC: 5.3 x10 3/mm (ref 3.6–11.0)

## 2013-08-02 LAB — LACTATE DEHYDROGENASE: LDH: 514 U/L — ABNORMAL HIGH (ref 81–246)

## 2013-08-03 ENCOUNTER — Ambulatory Visit: Payer: Self-pay | Admitting: Internal Medicine

## 2013-08-03 ENCOUNTER — Ambulatory Visit (INDEPENDENT_AMBULATORY_CARE_PROVIDER_SITE_OTHER): Payer: Medicare Other | Admitting: Internal Medicine

## 2013-08-03 ENCOUNTER — Telehealth: Payer: Self-pay | Admitting: Internal Medicine

## 2013-08-03 ENCOUNTER — Encounter: Payer: Self-pay | Admitting: Internal Medicine

## 2013-08-03 VITALS — BP 124/66 | HR 90 | Temp 98.4°F | Wt 144.0 lb

## 2013-08-03 DIAGNOSIS — J441 Chronic obstructive pulmonary disease with (acute) exacerbation: Secondary | ICD-10-CM

## 2013-08-03 DIAGNOSIS — F17200 Nicotine dependence, unspecified, uncomplicated: Secondary | ICD-10-CM

## 2013-08-03 LAB — PROT IMMUNOELECTROPHORES(ARMC)

## 2013-08-03 MED ORDER — PREDNISONE (PAK) 10 MG PO TABS: ORAL_TABLET | ORAL | Status: DC

## 2013-08-03 MED ORDER — LEVOFLOXACIN 500 MG PO TABS: 500.0000 mg | ORAL_TABLET | Freq: Every day | ORAL | Status: DC

## 2013-08-03 NOTE — Telephone Encounter (Signed)
Her chest x ray was normal.  No pneumonia.

## 2013-08-03 NOTE — Assessment & Plan Note (Addendum)
Current presentation suggest viral etiology. However she has COPD.  Will treat supportively, including levaquin andprednisone taper.  instructed to  Go to ER if symptoms fail to resolve in a few days or progress to include fevers, chest , facial or ear pain, or purulent/blood streaked sputum or nasal discharge. Checking urinary legionella antigen.,

## 2013-08-03 NOTE — Patient Instructions (Signed)
You are having a COPD exacerbation caused by a viral infection,  If you become more short of breath than you are now,  You need to go to the nearest ER immediately or call 911  I will try to treat you at home with the following:  Prednisone taper for 6 days,  60 mg daily for 1 day,  Then  taper by 1 tablet daily until gone Levaquin 1 tablet daily for 7 days (antibiotic)   Use the dulera inhaler 2 puffs twice daily until it is gone   Return on Friday to make sure you are improving   Please take a probiotic ( Align, Floraque or Culturelle) while you are on the antibiotic to prevent a serious antibiotic associated diarrhea  Called clostridium dificile colitis

## 2013-08-03 NOTE — Progress Notes (Signed)
Pre-visit discussion using our clinic review tool. No additional management support is needed unless otherwise documented below in the visit note.  

## 2013-08-03 NOTE — Progress Notes (Signed)
Patient ID: Caroline Nichols, female   DOB: [birth year], 71 y.o.   MRN: [id number]  .  Patient Active Problem List   Diagnosis Date Noted  . COPD exacerbation 08/03/2013  . Anemia, iron deficiency 04/22/2013  . Chest pain 03/26/2013  . Left carotid bruit 03/26/2013  . Anemia of chronic disease 03/20/2013  . Polyarthralgia 03/20/2013  . Former smoker 03/20/2013  . Hypothyroidism 03/20/2013  . Cellulitis of multiple sites of right hand and fingers 12/26/2012  . Pain in clavicular joint 12/26/2012  . Hyponatremia 12/26/2012  . IBS (irritable bowel syndrome)   . Herniated disc   . MURMUR 09/27/2009  . CHEST DISCOMFORT 09/27/2009  . Other postablative hypothyroidism 05/15/2009  . HYPOKALEMIA 05/15/2009  . ANXIETY STATE, UNSPECIFIED 05/15/2009  . HYPERTENSION 05/15/2009  . DEGENERATIVE DISC DISEASE 05/15/2009    Subjective:  CC:   Chief Complaint  Patient presents with  . Cough    Coughing up alot of mucus, started to get worse yesterday.   . Sore Throat    Since yesterday but no temperature. Been hoarse.     HPI:   Caroline Frein Moyeris a 72 y.o. female who presents  Past Medical History  Diagnosis Date  . Hypertension   . Hyperthyroidism     s/p radiation therapy, now hypothyroidism  . IBS (irritable bowel syndrome)   . Herniated disc   . Pancreatic cyst   . Arthritis     Possible RA - Follow up appt with Dr. Gavin Potters  . H/O transfusion of packed red blood cells 02/2013    3 units PRBC for severe anemia 02/2013  . Anemia 02/2013    F/U with Dr. Orlie Dakin for Feraheme injections  . Hyperlipidemia   . Allergy   . History of kidney stones   . Migraine   . Cellulitis of arm, right   . Paroxysmal a-fib   . Diabetes insipidus     On desmopressin    Past Surgical History  Procedure Laterality Date  . Tubal ligation    . Cervical spine surgery      Bone fusion  . Hammer toe surgery    . Hernia repair    . Abdominal hysterectomy    . Breast surgery  1990s    Biopsy        The following portions of the patient's history were reviewed and updated as appropriate: Allergies, current medications, and problem list.    Review of Systems:   12 Pt  review of systems was negative except those addressed in the HPI,     History   Social History  . Marital Status: Widowed    Spouse Name: N/A    Number of Children: 4  . Years of Education: 12   Occupational History  . Textiles     Retired  . [employer]     Retired   Social History Main Topics  . Smoking status: Former Smoker -- 0.50 packs/day for 30 years    Types: Cigarettes    Quit date: 12/03/2012  . Smokeless tobacco: Never Used  . Alcohol Use: No  . Drug Use: No  . Sexual Activity: No   Other Topics Concern  . Not on file   Social History Narrative   Ms. Vinas was born and reared in [place]. She is a widow since 17. She was married for 48 years. They had 4 children (daughters). She is currently leaving at [employer] since June.  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 She enjoys shopping. She also enjoys music, word puzzles and she loves spending time with her family. She loves attending church. One of her favorite things is wearing hats. She absolutely loves hats!    Objective:  Filed Vitals:   08/03/13 0856  BP: 124/66  Pulse: 90  Temp: 98.4 F (36.9 C)     General appearance: alert, cooperative and appears stated age. NAD Ears: normal TM's and external ear canals both ears Throat: lips, mucosa, and tongue normal; teeth and gums normal. Voice hoarse Neck: no adenopathy, no carotid bruit, supple, symmetrical, trachea midline and thyroid not enlarged, symmetric, no tenderness/mass/nodules Back: symmetric, no curvature. ROM normal. No CVA tenderness. Lungs: bilateral exp wheezing and ronchi,  No use of accessory muscles Heart: regular rate and rhythm, S1, S2  normal, no murmur, click, rub or gallop Abdomen: soft, non-tender; bowel sounds normal; no masses,  no organomegaly Pulses: 2+ and symmetric Skin: Skin color, texture, turgor normal. No rashes or lesions Lymph nodes: Cervical, supraclavicular, and axillary nodes normal.  Assessment and Plan: COPD exacerbation Current presentation suggest viral etiology. However she has COPD.  Will treat supportively, including levaquin andprednisone taper.  instructed to  Go to ER if symptoms fail to resolve in a few days or progress to include fevers, chest , facial or ear pain, or purulent/blood streaked sputum or nasal discharge. CXR XR was normal.  Checking urinary legionella antigen.,    Updated Medication List Outpatient Encounter Prescriptions as of 08/03/2013  Medication Sig  . acetaminophen (TYLENOL) 325 MG tablet Take 2 tablets (650 mg total) by mouth every 6 (six) hours as needed for pain.  Marland Kitchen ALPRAZolam (XANAX) 0.5 MG tablet Take 0.5 mg by mouth 3 (three) times daily as needed for anxiety.   Marland Kitchen amLODipine (NORVASC) 10 MG tablet Take 1 tablet (10 mg total) by mouth daily.  . Calcium-Vitamin D (SUPER CALCIUM/D) 600-125 MG-UNIT TABS Take by mouth. Taking 2 daily  . desmopressin (DDAVP) 0.2 MG tablet Take 0.1 mg by mouth at bedtime.   . folic acid (FOLVITE) 1 MG tablet Take 3 mg by mouth daily.   . hydroxychloroquine (PLAQUENIL) 200 MG tablet Take 200 mg by mouth daily.   Marland Kitchen levothyroxine (SYNTHROID, LEVOTHROID) 112 MCG tablet Take 112 mcg by mouth daily before breakfast.  . lisinopril (PRINIVIL,ZESTRIL) 10 MG tablet Take 1 tablet (10 mg total) by mouth daily.  . nitroGLYCERIN (NITROSTAT) 0.4 MG SL tablet Place 1 tablet (0.4 mg total) under the tongue every 5 (five) minutes as needed.  . Omega-3 Fatty Acids (FISH OIL) 1000 MG CAPS Take by mouth daily.  . pantoprazole (PROTONIX) 40 MG tablet Take 1 tablet (40 mg total) by mouth daily.  . vitamin C (ASCORBIC ACID) 500 MG tablet Take 500 mg by mouth  daily.  Marland Kitchen aspirin EC 81 MG tablet Take 81 mg by mouth daily.  . feeding supplement (GLUCERNA SHAKE) LIQD Take 237 mLs by mouth daily.  Marland Kitchen FLUVIRIN INJ injection   . levofloxacin (LEVAQUIN) 500 MG tablet Take 1 tablet (500 mg total) by mouth daily.  . methotrexate (RHEUMATREX) 2.5 MG tablet Take 10 mg by mouth once a week.   . predniSONE (STERAPRED UNI-PAK) 10 MG tablet 6 tablets on Day 1 , then reduce by 1 tablet daily until gone  . traMADol (ULTRAM) 50 MG tablet Take 50 mg by mouth every 4 (four) hours as needed.   . [  DISCONTINUED] predniSONE (DELTASONE) 5 MG tablet 15 mg daily.

## 2013-08-04 ENCOUNTER — Telehealth: Payer: Self-pay | Admitting: Adult Health

## 2013-08-04 MED ORDER — BENZONATATE 200 MG PO CAPS: 200.0000 mg | ORAL_CAPSULE | Freq: Three times a day (TID) | ORAL | Status: DC | PRN

## 2013-08-04 NOTE — Telephone Encounter (Signed)
Left message for pt to return my call to discuss further

## 2013-08-04 NOTE — Telephone Encounter (Signed)
The patient was seen by Dr. Darrick Huntsman yesterday. She has a cough that has gotten worse and she would like something called into the pharmacy.

## 2013-08-04 NOTE — Telephone Encounter (Signed)
We can try a non narcotic cough suppressant,  Tessalon,  I will call it to pharmacy

## 2013-08-04 NOTE — Telephone Encounter (Signed)
Left message, notifying of results.  

## 2013-08-04 NOTE — Telephone Encounter (Signed)
I spoke with patients daughter & informed her of the normal x-ray result. Also informed her to give the medication more time to work since she just started on it yesterday.

## 2013-08-04 NOTE — Telephone Encounter (Signed)
Pt.notified

## 2013-08-05 ENCOUNTER — Ambulatory Visit: Payer: Self-pay | Admitting: Hematology and Oncology

## 2013-08-05 ENCOUNTER — Ambulatory Visit: Payer: Self-pay | Admitting: Oncology

## 2013-08-06 ENCOUNTER — Ambulatory Visit: Payer: Medicare Other | Admitting: Adult Health

## 2013-08-06 ENCOUNTER — Telehealth: Payer: Self-pay | Admitting: Adult Health

## 2013-08-06 ENCOUNTER — Ambulatory Visit (INDEPENDENT_AMBULATORY_CARE_PROVIDER_SITE_OTHER): Payer: Medicare Other | Admitting: Adult Health

## 2013-08-06 ENCOUNTER — Encounter: Payer: Self-pay | Admitting: Adult Health

## 2013-08-06 ENCOUNTER — Other Ambulatory Visit: Payer: Self-pay | Admitting: Adult Health

## 2013-08-06 VITALS — BP 138/70 | HR 79 | Temp 98.0°F | Resp 16 | Wt 144.0 lb

## 2013-08-06 DIAGNOSIS — Z1239 Encounter for other screening for malignant neoplasm of breast: Secondary | ICD-10-CM

## 2013-08-06 DIAGNOSIS — J441 Chronic obstructive pulmonary disease with (acute) exacerbation: Secondary | ICD-10-CM | POA: Diagnosis not present

## 2013-08-06 NOTE — Telephone Encounter (Signed)
Pt notified at her appointment.

## 2013-08-06 NOTE — Telephone Encounter (Signed)
Pt stated it is time for her mammogram   This will  Her first time getting mammogram she would like to go to [facility]

## 2013-08-06 NOTE — Progress Notes (Signed)
Subjective:    Patient ID: Caroline Nichols, female    DOB: Feb 21, 1941, 73 y.o.   MRN: [id number]  HPI  Patient is a pleasant 73 year old female who was recently seen in clinic on 08/03/2013 and treated for COPD exacerbation. Patient presents for followup today.  She reports significant improvement in her symptoms. Continues taking her prednisone taper and antibiotics as prescribed.  Current Outpatient Prescriptions on File Prior to Visit  Medication Sig Dispense Refill  . acetaminophen (TYLENOL) 325 MG tablet Take 2 tablets (650 mg total) by mouth every 6 (six) hours as needed for pain.      Marland Kitchen ALPRAZolam (XANAX) 0.5 MG tablet Take 0.5 mg by mouth 3 (three) times daily as needed for anxiety.       Marland Kitchen amLODipine (NORVASC) 10 MG tablet Take 1 tablet (10 mg total) by mouth daily.  30 tablet  0  . benzonatate (TESSALON) 200 MG capsule Take 1 capsule (200 mg total) by mouth 3 (three) times daily as needed for cough.  60 capsule  2  . Calcium-Vitamin D (SUPER CALCIUM/D) 600-125 MG-UNIT TABS Take by mouth. Taking 2 daily      . desmopressin (DDAVP) 0.2 MG tablet Take 0.1 mg by mouth at bedtime.       . folic acid (FOLVITE) 1 MG tablet Take 3 mg by mouth daily.       . hydroxychloroquine (PLAQUENIL) 200 MG tablet Take 200 mg by mouth daily.       Marland Kitchen levofloxacin (LEVAQUIN) 500 MG tablet Take 1 tablet (500 mg total) by mouth daily.  7 tablet  0  . levothyroxine (SYNTHROID, LEVOTHROID) 112 MCG tablet Take 112 mcg by mouth daily before breakfast.      . lisinopril (PRINIVIL,ZESTRIL) 10 MG tablet Take 1 tablet (10 mg total) by mouth daily.  30 tablet  0  . methotrexate (RHEUMATREX) 2.5 MG tablet Take 10 mg by mouth once a week.       . nitroGLYCERIN (NITROSTAT) 0.4 MG SL tablet Place 1 tablet (0.4 mg total) under the tongue every 5 (five) minutes as needed.  25 tablet  3  . Omega-3 Fatty Acids (FISH OIL) 1000 MG CAPS Take by mouth daily.      . pantoprazole (PROTONIX) 40 MG tablet Take 1 tablet (40 mg  total) by mouth daily.  30 tablet  0  . predniSONE (STERAPRED UNI-PAK) 10 MG tablet 6 tablets on Day 1 , then reduce by 1 tablet daily until gone  21 tablet  0  . traMADol (ULTRAM) 50 MG tablet Take 50 mg by mouth every 4 (four) hours as needed.       . vitamin C (ASCORBIC ACID) 500 MG tablet Take 500 mg by mouth daily.       No current facility-administered medications on file prior to visit.         Review of Systems  Constitutional: Negative for fever and chills.  HENT: Positive for congestion and postnasal drip. Negative for sore throat.   Respiratory: Negative for chest tightness, shortness of breath and wheezing.   Cardiovascular: Negative.        Objective:   Physical Exam  Constitutional: She is oriented to person, place, and time. She appears well-developed and well-nourished. No distress.  HENT:  Head: Normocephalic and atraumatic.  Cardiovascular: Normal rate, regular rhythm and normal heart sounds.   Pulmonary/Chest: Effort normal and breath sounds normal. No respiratory distress. She has no wheezes. She has no rales.  Good  air movement throughout all lung fields  Lymphadenopathy:    She has no cervical adenopathy.  Neurological: She is alert and oriented to person, place, and time.  Skin: Skin is warm and dry.  Psychiatric: She has a normal mood and affect. Her behavior is normal. Judgment and thought content normal.          Assessment & Plan:

## 2013-08-06 NOTE — Telephone Encounter (Signed)
Ordered

## 2013-08-06 NOTE — Assessment & Plan Note (Signed)
Followup visit from 08/03/2013. Patient continues with prednisone taper as well as Levaquin. She is doing well. Symptoms seem to be improving nicely. RTC if symptoms worsen or if incomplete resolution.

## 2013-08-06 NOTE — Progress Notes (Signed)
Pre visit review using our clinic review tool, if applicable. No additional management support is needed unless otherwise documented below in the visit note. 

## 2013-08-07 LAB — LEGIONELLA ANTIGEN, URINE: Result - LGAGUR: NEGATIVE

## 2013-08-17 ENCOUNTER — Encounter: Payer: Self-pay | Admitting: Emergency Medicine

## 2013-08-18 ENCOUNTER — Ambulatory Visit (INDEPENDENT_AMBULATORY_CARE_PROVIDER_SITE_OTHER): Payer: Medicare Other | Admitting: Internal Medicine

## 2013-08-18 ENCOUNTER — Ambulatory Visit: Payer: Medicare Other | Admitting: Internal Medicine

## 2013-08-18 ENCOUNTER — Encounter: Payer: Self-pay | Admitting: Internal Medicine

## 2013-08-18 VITALS — BP 130/64 | HR 82 | Temp 98.4°F | Ht 62.0 in | Wt 145.8 lb

## 2013-08-18 DIAGNOSIS — E890 Postprocedural hypothyroidism: Secondary | ICD-10-CM | POA: Diagnosis not present

## 2013-08-18 LAB — TSH: TSH: 1 u[IU]/mL (ref 0.35–5.50)

## 2013-08-18 LAB — T4, FREE: FREE T4: 1.19 ng/dL (ref 0.60–1.60)

## 2013-08-18 NOTE — Progress Notes (Signed)
Pre-visit discussion using our clinic review tool. No additional management support is needed unless otherwise documented below in the visit note.  

## 2013-08-18 NOTE — Progress Notes (Signed)
Patient ID: Caroline Nichols, female   DOB: June 20, 1941, 73 y.o.   MRN: [id number]   HPI  Caroline Nichols is a 73 y.o.-year-old female, referred by her PCP, Rey,Raquel, NP, for evaluation for hypothyroidism. She saw Dr Albertine Patricia at [facility], would like to be seen locally.  Pt. has been dx with hyperthyroidism and "inverted goiter" in 1980s >> Dr Severe has sent her for RAI ablation >> postablative hypothyroidism >> started on Levothyroxine 88 >> 125 >> 112 mcg (had variability in the TFTs in the past).  She takes Levothyroxine 112 mcg: - fasting - with water - separated by >30 min from b'fast  - takes calcium at night - takes iron along calcium at night - takes PPIs (Protonix) at lunch  She was on Prednisone taper for URI >> last dose 1 week ago.  I reviewed pt's thyroid tests: - no recent TFTs or recent changes in levothyroxine dose Lab Results  Component Value Date   TSH 12.303* 12/31/2012   TSH 11.793* 12/27/2012   TSH 0.87 05/15/2009   FREET4 1.53 12/27/2012    Pt denies feeling nodules in neck, hoarseness, dysphagia/odynophagia, SOB with lying down.  Pt describes: - + cold and hot intolerance - + weight gain >> after she initially lost weight when she was sick - + fatigue - + constipation/+ diarrhea - + dry skin - + hair falling - no depression - + anxiety  She has no FH of thyroid disorders. No FH of thyroid cancer.  No h/o radiation tx to head or neck, except RAI ablation. No recent use of iodine supplements.  ROS: Constitutional: + weight gain - after Prednisone, + fatigue, no subjective hyperthermia/hypothermia, + poor sleep Eyes: no blurry vision, no xerophthalmia ENT: no sore throat, no  nodules palpated in throat, no dysphagia/odynophagia, + occasional hoarseness Cardiovascular: no CP/SOB/palpitations/+ leg swelling Respiratory: no cough/SOB Gastrointestinal: + N/+ V/no D/C Musculoskeletal: + muscle/+ joint aches Skin: no rashes Neurological: no  tremors/numbness/tingling/dizziness Psychiatric: no depression/+ anxiety  Past Medical History  Diagnosis Date  . Hypertension   . Hyperthyroidism     s/p radiation therapy, now hypothyroidism  . IBS (irritable bowel syndrome)   . Herniated disc   . Pancreatic cyst   . Arthritis     Possible RA - Follow up appt with Dr. Jefm Bryant  . H/O transfusion of packed red blood cells 02/2013    3 units PRBC for severe anemia 02/2013  . Anemia 02/2013    F/U with Dr. Grayland Ormond for Feraheme injections  . Hyperlipidemia   . Allergy   . History of kidney stones   . Migraine   . Cellulitis of arm, right   . Paroxysmal a-fib   . Diabetes insipidus     On desmopressin   Past Surgical History  Procedure Laterality Date  . Tubal ligation    . Cervical spine surgery      Bone fusion  . Hammer toe surgery    . Hernia repair    . Abdominal hysterectomy    . Breast surgery  1990s    Biopsy   History   Social History  . Marital Status: Widowed    Spouse Name: N/A    Number of Children: 4  . Years of Education: 12   Occupational History  . Textiles     Retired  . [employer] [place]     Retired   Social History Main Topics  . Smoking status: Former Smoker -- 0.50 packs/day  for 30 years    Types: Cigarettes    Quit date: 12/03/2012  . Smokeless tobacco: Never Used  . Alcohol Use: No  . Drug Use: No  . Sexual Activity: No   Other Topics Concern  . Not on file   Social History Narrative   Ms. Ramseur was born and reared in [place]. She is a widow since 59. She was married for 48 years. They had 4 children (daughters). She is currently leaving at [facility] since June. 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 She enjoys shopping. She also enjoys music, word puzzles and she loves spending time with her family. She loves attending church. One of her favorite things is wearing hats. She  absolutely loves hats!   Current Outpatient Prescriptions on File Prior to Visit  Medication Sig Dispense Refill  . acetaminophen (TYLENOL) 325 MG tablet Take 2 tablets (650 mg total) by mouth every 6 (six) hours as needed for pain.      Marland Kitchen ALPRAZolam (XANAX) 0.5 MG tablet Take 0.5 mg by mouth 3 (three) times daily as needed for anxiety.       Marland Kitchen amLODipine (NORVASC) 10 MG tablet Take 1 tablet (10 mg total) by mouth daily.  30 tablet  0  . Calcium-Vitamin D (SUPER CALCIUM/D) 600-125 MG-UNIT TABS Take by mouth. Taking 2 daily      . desmopressin (DDAVP) 0.2 MG tablet Take 0.1 mg by mouth at bedtime.       . folic acid (FOLVITE) 1 MG tablet Take 3 mg by mouth daily.       . hydroxychloroquine (PLAQUENIL) 200 MG tablet Take 200 mg by mouth daily.       Marland Kitchen levothyroxine (SYNTHROID, LEVOTHROID) 112 MCG tablet Take 112 mcg by mouth daily before breakfast.      . lisinopril (PRINIVIL,ZESTRIL) 10 MG tablet Take 1 tablet (10 mg total) by mouth daily.  30 tablet  0  . nitroGLYCERIN (NITROSTAT) 0.4 MG SL tablet Place 1 tablet (0.4 mg total) under the tongue every 5 (five) minutes as needed.  25 tablet  3  . Omega-3 Fatty Acids (FISH OIL) 1000 MG CAPS Take by mouth daily.      . pantoprazole (PROTONIX) 40 MG tablet Take 1 tablet (40 mg total) by mouth daily.  30 tablet  0  . vitamin C (ASCORBIC ACID) 500 MG tablet Take 500 mg by mouth daily.      . benzonatate (TESSALON) 200 MG capsule Take 1 capsule (200 mg total) by mouth 3 (three) times daily as needed for cough.  60 capsule  2  . levofloxacin (LEVAQUIN) 500 MG tablet Take 1 tablet (500 mg total) by mouth daily.  7 tablet  0  . methotrexate (RHEUMATREX) 2.5 MG tablet Take 10 mg by mouth once a week.       . predniSONE (STERAPRED UNI-PAK) 10 MG tablet 6 tablets on Day 1 , then reduce by 1 tablet daily until gone  21 tablet  0  . traMADol (ULTRAM) 50 MG tablet Take 50 mg by mouth every 4 (four) hours as needed.        No current facility-administered  medications on file prior to visit.   Allergies  Allergen Reactions  . Codeine     REACTION: NAUSEA  . Tetanus Toxoid     REACTION: ARM SWELLING,REDNESS  . Tramadol Nausea And Vomiting   Family History  Problem  Relation Age of Onset  . Heart disease Mother   . Heart disease Father   . Diabetes Brother   . Kidney disease Brother   . Stroke Daughter     due to medication reaction   PE: BP 130/64  Pulse 82  Temp(Src) 98.4 F (36.9 C) (Oral)  Ht 5\' 2"  (1.575 m)  Wt 145 lb 12 oz (66.112 kg)  BMI 26.65 kg/m2  SpO2 96% Wt Readings from Last 3 Encounters:  08/18/13 145 lb 12 oz (66.112 kg)  08/06/13 144 lb (65.318 kg)  08/03/13 144 lb (65.318 kg)   Constitutional: normal weight, in NAD Eyes: PERRLA, EOMI, + exophthalmos, no lid lag, no stare ENT: moist mucous membranes, no thyromegaly, no thyroid nodules palpated, no cervical lymphadenopathy Cardiovascular: RRR, No MRG Respiratory: CTA B Gastrointestinal: abdomen soft, NT, ND, BS+ Musculoskeletal: no deformities, strength intact in all 4 Skin: moist, warm, no rashes Neurological: no tremor with outstretched hands, DTR normal in all 4  ASSESSMENT: 1. Hypothyroidism - post RAI tx for Hyperthyroidism (likely Graves ds)  PLAN:  1. Patient with long-standing iatrogenic hypothyroidism, on levothyroxine therapy. She appears euthyroid. - We discussed about correct intake of levothyroxine, fasting, with water, separated by at least 30 minutes from breakfast, and separated by more than 4 hours from calcium, iron, multivitamins, acid reflux medications (PPIs). - She does not appear to have a goiter, thyroid nodules, or neck compression symptoms - we'll check thyroid tests today: TSH, free T4 - If these are abnormal, she will need to return in 6-8 weeks for repeat labs - If these are normal, I will see her back in 4 months  Office Visit on 08/18/2013  Component Date Value Range Status  . Free T4 08/18/2013 1.19  0.60 - 1.60 ng/dL  Final  . TSH 08/18/2013 1.00  0.35 - 5.50 uIU/mL Final   Will repeat tests when she comes back in 4 mo.

## 2013-08-18 NOTE — Patient Instructions (Signed)
Please stop at the lab. We will let you know the results ASAP. Please come back for a follow-up appointment in 4 months.

## 2013-08-19 DIAGNOSIS — H4011X0 Primary open-angle glaucoma, stage unspecified: Secondary | ICD-10-CM | POA: Diagnosis not present

## 2013-08-19 DIAGNOSIS — H2510 Age-related nuclear cataract, unspecified eye: Secondary | ICD-10-CM | POA: Diagnosis not present

## 2013-08-19 DIAGNOSIS — H409 Unspecified glaucoma: Secondary | ICD-10-CM | POA: Diagnosis not present

## 2013-08-23 DIAGNOSIS — M069 Rheumatoid arthritis, unspecified: Secondary | ICD-10-CM | POA: Diagnosis not present

## 2013-08-23 DIAGNOSIS — G609 Hereditary and idiopathic neuropathy, unspecified: Secondary | ICD-10-CM | POA: Diagnosis not present

## 2013-08-26 DIAGNOSIS — M064 Inflammatory polyarthropathy: Secondary | ICD-10-CM | POA: Diagnosis not present

## 2013-08-26 DIAGNOSIS — D72819 Decreased white blood cell count, unspecified: Secondary | ICD-10-CM | POA: Diagnosis not present

## 2013-08-26 DIAGNOSIS — D649 Anemia, unspecified: Secondary | ICD-10-CM | POA: Diagnosis not present

## 2013-08-26 DIAGNOSIS — G609 Hereditary and idiopathic neuropathy, unspecified: Secondary | ICD-10-CM | POA: Diagnosis not present

## 2013-08-30 ENCOUNTER — Encounter: Payer: Self-pay | Admitting: Internal Medicine

## 2013-09-01 ENCOUNTER — Telehealth: Payer: Self-pay | Admitting: Adult Health

## 2013-09-01 NOTE — Telephone Encounter (Signed)
Relevant patient education mailed to patient.  

## 2013-09-02 ENCOUNTER — Ambulatory Visit: Payer: Self-pay | Admitting: Oncology

## 2013-09-02 DIAGNOSIS — Z90710 Acquired absence of both cervix and uterus: Secondary | ICD-10-CM | POA: Diagnosis not present

## 2013-09-02 DIAGNOSIS — E119 Type 2 diabetes mellitus without complications: Secondary | ICD-10-CM | POA: Diagnosis not present

## 2013-09-02 DIAGNOSIS — E039 Hypothyroidism, unspecified: Secondary | ICD-10-CM | POA: Diagnosis not present

## 2013-09-02 DIAGNOSIS — Z79899 Other long term (current) drug therapy: Secondary | ICD-10-CM | POA: Diagnosis not present

## 2013-09-02 DIAGNOSIS — I10 Essential (primary) hypertension: Secondary | ICD-10-CM | POA: Diagnosis not present

## 2013-09-02 DIAGNOSIS — D72819 Decreased white blood cell count, unspecified: Secondary | ICD-10-CM | POA: Diagnosis not present

## 2013-09-02 LAB — CBC CANCER CENTER
BASOS ABS: 0 x10 3/mm (ref 0.0–0.1)
BASOS PCT: 0.4 %
Eosinophil #: 0 x10 3/mm (ref 0.0–0.7)
Eosinophil %: 0.7 %
HCT: 36.2 % (ref 35.0–47.0)
HGB: 11.6 g/dL — ABNORMAL LOW (ref 12.0–16.0)
LYMPHS ABS: 0.8 x10 3/mm — AB (ref 1.0–3.6)
Lymphocyte %: 33.6 %
MCH: 28.8 pg (ref 26.0–34.0)
MCHC: 32.2 g/dL (ref 32.0–36.0)
MCV: 89 fL (ref 80–100)
Monocyte #: 1.1 x10 3/mm — ABNORMAL HIGH (ref 0.2–0.9)
Monocyte %: 42.9 %
NEUTROS PCT: 22.4 %
Neutrophil #: 0.6 x10 3/mm — ABNORMAL LOW (ref 1.4–6.5)
Platelet: 114 x10 3/mm — ABNORMAL LOW (ref 150–440)
RBC: 4.05 10*6/uL (ref 3.80–5.20)
RDW: 15.3 % — ABNORMAL HIGH (ref 11.5–14.5)
WBC: 2.5 x10 3/mm — AB (ref 3.6–11.0)

## 2013-09-07 ENCOUNTER — Ambulatory Visit: Payer: Self-pay | Admitting: Adult Health

## 2013-09-07 DIAGNOSIS — R922 Inconclusive mammogram: Secondary | ICD-10-CM | POA: Diagnosis not present

## 2013-09-07 DIAGNOSIS — Z1231 Encounter for screening mammogram for malignant neoplasm of breast: Secondary | ICD-10-CM | POA: Diagnosis not present

## 2013-09-08 DIAGNOSIS — H4011X0 Primary open-angle glaucoma, stage unspecified: Secondary | ICD-10-CM | POA: Diagnosis not present

## 2013-09-08 DIAGNOSIS — H409 Unspecified glaucoma: Secondary | ICD-10-CM | POA: Diagnosis not present

## 2013-09-08 DIAGNOSIS — H2510 Age-related nuclear cataract, unspecified eye: Secondary | ICD-10-CM | POA: Diagnosis not present

## 2013-09-23 DIAGNOSIS — R0602 Shortness of breath: Secondary | ICD-10-CM | POA: Diagnosis not present

## 2013-09-23 DIAGNOSIS — M545 Low back pain, unspecified: Secondary | ICD-10-CM | POA: Diagnosis not present

## 2013-09-23 DIAGNOSIS — M064 Inflammatory polyarthropathy: Secondary | ICD-10-CM | POA: Diagnosis not present

## 2013-09-23 DIAGNOSIS — R609 Edema, unspecified: Secondary | ICD-10-CM | POA: Diagnosis not present

## 2013-09-27 ENCOUNTER — Encounter: Payer: Self-pay | Admitting: Adult Health

## 2013-10-07 ENCOUNTER — Encounter: Payer: Self-pay | Admitting: Adult Health

## 2013-10-07 ENCOUNTER — Ambulatory Visit (INDEPENDENT_AMBULATORY_CARE_PROVIDER_SITE_OTHER): Payer: Medicare Other | Admitting: Adult Health

## 2013-10-07 VITALS — BP 122/60 | HR 78 | Resp 16 | Wt 146.0 lb

## 2013-10-07 DIAGNOSIS — I739 Peripheral vascular disease, unspecified: Secondary | ICD-10-CM | POA: Diagnosis not present

## 2013-10-07 DIAGNOSIS — R609 Edema, unspecified: Secondary | ICD-10-CM

## 2013-10-07 DIAGNOSIS — I70219 Atherosclerosis of native arteries of extremities with intermittent claudication, unspecified extremity: Secondary | ICD-10-CM | POA: Insufficient documentation

## 2013-10-07 DIAGNOSIS — R0989 Other specified symptoms and signs involving the circulatory and respiratory systems: Secondary | ICD-10-CM

## 2013-10-07 DIAGNOSIS — I8310 Varicose veins of unspecified lower extremity with inflammation: Secondary | ICD-10-CM | POA: Diagnosis not present

## 2013-10-07 DIAGNOSIS — I872 Venous insufficiency (chronic) (peripheral): Secondary | ICD-10-CM | POA: Insufficient documentation

## 2013-10-07 DIAGNOSIS — R600 Localized edema: Secondary | ICD-10-CM

## 2013-10-07 MED ORDER — GABAPENTIN 300 MG PO CAPS: 300.0000 mg | ORAL_CAPSULE | Freq: Three times a day (TID) | ORAL | Status: DC

## 2013-10-07 NOTE — Patient Instructions (Signed)
  For your nerve ending pain - take Gabapentin (neurontin) 300 mg three times a day.   You can finish up your current prescription of Gabapentin but these are only 100 mg so you will need to take 3 capsules to equal the 300 mg dose.  I am referring you to [facility] to evaluate your lower extremity circulation.

## 2013-10-07 NOTE — Progress Notes (Signed)
Pre visit review using our clinic review tool, if applicable. No additional management support is needed unless otherwise documented below in the visit note. 

## 2013-10-07 NOTE — Progress Notes (Signed)
Patient ID: Caroline Nichols, female   DOB: 08-05-1941, 73 y.o.   MRN: [id number]    Subjective:    Patient ID: Caroline Nichols, female    DOB: April 18, 1941, 73 y.o.   MRN: [id number]  HPI  Pt is a very pleasant and spry 73 y/o female with multiple medical problems as listed below who presents to clinic with concerns of lower extremity edema and skin discoloration. She presents to clinic with one of her daughters who reports that pt's legs sometimes get so swollen that "they look like they are going to pop". Minimal edema today. Edema right around ankles. She is also concerned about the darkening of the skin around her ankles. She takes amlodipine for blood pressure in addition to other meds listed below.   Past Medical History  Diagnosis Date  . Hypertension   . Hyperthyroidism     s/p radiation therapy, now hypothyroidism  . IBS (irritable bowel syndrome)   . Herniated disc   . Pancreatic cyst   . Arthritis     Possible RA - Follow up appt with Dr. Jefm Bryant  . H/O transfusion of packed red blood cells 02/2013    3 units PRBC for severe anemia 02/2013  . Anemia 02/2013    F/U with Dr. Grayland Ormond for Feraheme injections  . Hyperlipidemia   . Allergy   . History of kidney stones   . Migraine   . Cellulitis of arm, right   . Paroxysmal a-fib   . Diabetes insipidus     On desmopressin     Current Outpatient Prescriptions on File Prior to Visit  Medication Sig Dispense Refill  . ALPRAZolam (XANAX) 0.5 MG tablet Take 0.5 mg by mouth 3 (three) times daily as needed for anxiety.       . Calcium-Vitamin D (SUPER CALCIUM/D) 600-125 MG-UNIT TABS Take by mouth. Taking 2 daily      . Cholecalciferol (D3-1000 PO) Take by mouth daily.      Marland Kitchen desmopressin (DDAVP) 0.2 MG tablet Take 0.1 mg by mouth at bedtime.       . ferrous sulfate 325 (65 FE) MG tablet Take 325 mg by mouth daily with breakfast.      . folic acid (FOLVITE) 1 MG tablet Take 1 mg by mouth 3 (three) times daily.       .  hydroxychloroquine (PLAQUENIL) 200 MG tablet Take 400 mg by mouth daily.       Marland Kitchen levothyroxine (SYNTHROID, LEVOTHROID) 112 MCG tablet Take 112 mcg by mouth daily before breakfast.      . lisinopril (PRINIVIL,ZESTRIL) 10 MG tablet Take 1 tablet (10 mg total) by mouth daily.  30 tablet  0  . Omega-3 Fatty Acids (FISH OIL) 1000 MG CAPS Take by mouth daily.      . pantoprazole (PROTONIX) 40 MG tablet Take 1 tablet (40 mg total) by mouth daily.  30 tablet  0  . vitamin C (ASCORBIC ACID) 500 MG tablet Take 500 mg by mouth daily.      Marland Kitchen acetaminophen (TYLENOL) 325 MG tablet Take 2 tablets (650 mg total) by mouth every 6 (six) hours as needed for pain.      . nitroGLYCERIN (NITROSTAT) 0.4 MG SL tablet Place 1 tablet (0.4 mg total) under the tongue every 5 (five) minutes as needed.  25 tablet  3   No current facility-administered medications on file prior to visit.     Review of Systems  Constitutional: Negative for fever, chills, activity change,  appetite change, fatigue and unexpected weight change.  Respiratory: Negative.   Cardiovascular: Positive for leg swelling.  Musculoskeletal: Positive for arthralgias.  Skin: Positive for color change (of skin around bilateral ankles).  Neurological: Negative for dizziness, syncope, weakness and light-headedness.  All other systems reviewed and are negative.       Objective:  BP 122/60  Pulse 78  Resp 16  Wt 146 lb (66.225 kg)  SpO2 97%   Physical Exam  Constitutional: She is oriented to person, place, and time. She appears well-developed and well-nourished. No distress.  Pt is in good spirits. She is well groomed.  Cardiovascular: Normal rate, regular rhythm and intact distal pulses.   Pulmonary/Chest: Effort normal. No respiratory distress.  Musculoskeletal: Normal range of motion. She exhibits edema. She exhibits no tenderness.  Neurological: She is alert and oriented to person, place, and time. Coordination normal.  Skin: Skin is warm. No  rash noted. No erythema.  Stasis dermatitis bilateral lower extremities  Psychiatric: She has a normal mood and affect. Her behavior is normal. Judgment and thought content normal.      Assessment & Plan:   1. Edema of both legs Suspect may be 2/2 amlodipine. Will discontinue medication. She will monitor b/p and we will make changes to meds accordingly.  2. Poor circulation of extremity Achy legs, swelling and discoloration of skin which I suspect is dermatitis secondary to venous stasis. Will refer to [facility] for evaluation and further recommendations. Pt is a very active female and symptoms are altering her QOL. Recommend support hose. She has a pair but finds it very difficult to put them on. Discussed knee hi hose that may be easier for her to put on.  - Ambulatory referral to Vascular Surgery  3. Venous stasis dermatitis Bilateral ankles. Appointment with [facility] to further assess.

## 2013-10-08 ENCOUNTER — Encounter: Payer: Self-pay | Admitting: Rheumatology

## 2013-10-08 DIAGNOSIS — M6281 Muscle weakness (generalized): Secondary | ICD-10-CM | POA: Diagnosis not present

## 2013-10-08 DIAGNOSIS — IMO0001 Reserved for inherently not codable concepts without codable children: Secondary | ICD-10-CM | POA: Diagnosis not present

## 2013-10-08 DIAGNOSIS — R262 Difficulty in walking, not elsewhere classified: Secondary | ICD-10-CM | POA: Diagnosis not present

## 2013-10-08 DIAGNOSIS — M545 Low back pain, unspecified: Secondary | ICD-10-CM | POA: Diagnosis not present

## 2013-10-12 ENCOUNTER — Encounter: Payer: Self-pay | Admitting: Adult Health

## 2013-10-15 ENCOUNTER — Other Ambulatory Visit: Payer: Self-pay | Admitting: Adult Health

## 2013-10-15 ENCOUNTER — Telehealth: Payer: Self-pay | Admitting: Adult Health

## 2013-10-15 MED ORDER — HYDRALAZINE HCL 10 MG PO TABS: 10.0000 mg | ORAL_TABLET | Freq: Three times a day (TID) | ORAL | Status: DC

## 2013-10-15 NOTE — Telephone Encounter (Signed)
Left message for pt to return my call.

## 2013-10-15 NOTE — Telephone Encounter (Signed)
Sending in Hydralazine 10 mg tid for blood pressure. If no improvement by Monday please call.

## 2013-10-15 NOTE — Telephone Encounter (Signed)
Pt left vm.  States her BP has been very elevated since R. Rey took her off BP med.  Asking for a call.

## 2013-10-15 NOTE — Telephone Encounter (Signed)
Nurse checked BP today and she was 200/100. Desnies any symptoms, headache, SOB, chest pain. States "I don't feel bad at all". Stopped Amlodipine at last visit. Swelling has improved . Has appt with [facility] 10/21/13.

## 2013-10-15 NOTE — Telephone Encounter (Signed)
Pt.notified

## 2013-10-21 DIAGNOSIS — I10 Essential (primary) hypertension: Secondary | ICD-10-CM | POA: Diagnosis not present

## 2013-10-21 DIAGNOSIS — M7989 Other specified soft tissue disorders: Secondary | ICD-10-CM | POA: Diagnosis not present

## 2013-10-21 DIAGNOSIS — I739 Peripheral vascular disease, unspecified: Secondary | ICD-10-CM | POA: Diagnosis not present

## 2013-10-21 DIAGNOSIS — I872 Venous insufficiency (chronic) (peripheral): Secondary | ICD-10-CM | POA: Diagnosis not present

## 2013-10-28 DIAGNOSIS — M545 Low back pain, unspecified: Secondary | ICD-10-CM | POA: Diagnosis not present

## 2013-10-28 DIAGNOSIS — D72819 Decreased white blood cell count, unspecified: Secondary | ICD-10-CM | POA: Diagnosis not present

## 2013-10-28 DIAGNOSIS — M064 Inflammatory polyarthropathy: Secondary | ICD-10-CM | POA: Diagnosis not present

## 2013-10-28 DIAGNOSIS — R609 Edema, unspecified: Secondary | ICD-10-CM | POA: Diagnosis not present

## 2013-11-02 DIAGNOSIS — H409 Unspecified glaucoma: Secondary | ICD-10-CM | POA: Diagnosis not present

## 2013-11-02 DIAGNOSIS — H2510 Age-related nuclear cataract, unspecified eye: Secondary | ICD-10-CM | POA: Diagnosis not present

## 2013-11-02 DIAGNOSIS — H4011X0 Primary open-angle glaucoma, stage unspecified: Secondary | ICD-10-CM | POA: Diagnosis not present

## 2013-11-03 ENCOUNTER — Encounter: Payer: Self-pay | Admitting: Rheumatology

## 2013-11-03 DIAGNOSIS — IMO0001 Reserved for inherently not codable concepts without codable children: Secondary | ICD-10-CM | POA: Diagnosis not present

## 2013-11-03 DIAGNOSIS — M545 Low back pain, unspecified: Secondary | ICD-10-CM | POA: Diagnosis not present

## 2013-11-03 DIAGNOSIS — R262 Difficulty in walking, not elsewhere classified: Secondary | ICD-10-CM | POA: Diagnosis not present

## 2013-11-03 DIAGNOSIS — M6281 Muscle weakness (generalized): Secondary | ICD-10-CM | POA: Diagnosis not present

## 2013-11-05 ENCOUNTER — Ambulatory Visit: Payer: Medicare Other | Admitting: Adult Health

## 2013-11-10 ENCOUNTER — Ambulatory Visit: Payer: Self-pay | Admitting: Rheumatology

## 2013-11-10 DIAGNOSIS — R5383 Other fatigue: Secondary | ICD-10-CM | POA: Diagnosis not present

## 2013-11-10 DIAGNOSIS — R5381 Other malaise: Secondary | ICD-10-CM | POA: Diagnosis not present

## 2013-11-10 DIAGNOSIS — M79609 Pain in unspecified limb: Secondary | ICD-10-CM | POA: Diagnosis not present

## 2013-11-11 ENCOUNTER — Ambulatory Visit (INDEPENDENT_AMBULATORY_CARE_PROVIDER_SITE_OTHER): Payer: Medicare Other | Admitting: Adult Health

## 2013-11-11 ENCOUNTER — Encounter: Payer: Self-pay | Admitting: Adult Health

## 2013-11-11 VITALS — BP 112/50 | HR 55 | Temp 97.9°F | Resp 14 | Wt 145.0 lb

## 2013-11-11 DIAGNOSIS — I10 Essential (primary) hypertension: Secondary | ICD-10-CM | POA: Insufficient documentation

## 2013-11-11 DIAGNOSIS — Z79899 Other long term (current) drug therapy: Secondary | ICD-10-CM | POA: Diagnosis not present

## 2013-11-11 DIAGNOSIS — D649 Anemia, unspecified: Secondary | ICD-10-CM | POA: Diagnosis not present

## 2013-11-11 DIAGNOSIS — D61818 Other pancytopenia: Secondary | ICD-10-CM | POA: Insufficient documentation

## 2013-11-11 LAB — BASIC METABOLIC PANEL
BUN: 14 mg/dL (ref 6–23)
CALCIUM: 9.3 mg/dL (ref 8.4–10.5)
CO2: 28 meq/L (ref 19–32)
CREATININE: 0.6 mg/dL (ref 0.4–1.2)
Chloride: 103 mEq/L (ref 96–112)
GFR: 133.96 mL/min (ref >60.00)
Glucose, Bld: 77 mg/dL (ref 70–99)
Potassium: 4.3 mEq/L (ref 3.5–5.1)
Sodium: 138 mEq/L (ref 135–145)

## 2013-11-11 LAB — CBC WITH DIFFERENTIAL/PLATELET
HEMATOCRIT: 32.7 % — AB (ref 36.0–46.0)
Hemoglobin: 11.2 g/dL — ABNORMAL LOW (ref 12.0–15.0)
MCHC: 34.3 g/dL (ref 30.0–36.0)
MCV: 88.6 fl (ref 78.0–100.0)
Platelets: 131 10*3/uL — ABNORMAL LOW (ref 150.0–400.0)
RBC: 3.69 Mil/uL — ABNORMAL LOW (ref 3.87–5.11)
RDW: 17.1 % — ABNORMAL HIGH (ref 11.5–14.6)
WBC: 2.9 10*3/uL — ABNORMAL LOW (ref 4.5–10.5)

## 2013-11-11 MED ORDER — HYDROXYCHLOROQUINE SULFATE 200 MG PO TABS: 400.0000 mg | ORAL_TABLET | Freq: Every day | ORAL | Status: DC

## 2013-11-11 MED ORDER — DESMOPRESSIN ACETATE 0.2 MG PO TABS: 0.1000 mg | ORAL_TABLET | Freq: Every day | ORAL | Status: DC

## 2013-11-11 MED ORDER — FOLIC ACID 1 MG PO TABS: 1.0000 mg | ORAL_TABLET | Freq: Three times a day (TID) | ORAL | Status: DC

## 2013-11-11 MED ORDER — PANTOPRAZOLE SODIUM 40 MG PO TBEC: 40.0000 mg | DELAYED_RELEASE_TABLET | Freq: Every day | ORAL | Status: DC

## 2013-11-11 MED ORDER — ALPRAZOLAM 0.5 MG PO TABS: 0.5000 mg | ORAL_TABLET | Freq: Three times a day (TID) | ORAL | Status: DC | PRN

## 2013-11-11 MED ORDER — LISINOPRIL 10 MG PO TABS: 10.0000 mg | ORAL_TABLET | Freq: Every day | ORAL | Status: DC

## 2013-11-11 NOTE — Progress Notes (Signed)
Pre visit review using our clinic review tool, if applicable. No additional management support is needed unless otherwise documented below in the visit note. 

## 2013-11-11 NOTE — Patient Instructions (Addendum)
  Stop taking the hydralazine 3 times a day. You will only need to take this medication if your blood pressure goes above 150 (top number).  Please have your blood drawn prior to leaving the office.  I will call you with the results of your blood work once they are available.  I have sent refills for all your medications to [facility] pharmacy [facility] [address]. Please call with any problems.  Please return for your complete physical (Medicare Wellness Exam) in August.

## 2013-11-11 NOTE — Progress Notes (Signed)
Patient ID: Caroline Nichols, female   DOB: Aug 08, 1940, 73 y.o.   MRN: [id number]    Subjective:    Patient ID: Caroline Nichols, female    DOB: 02/04/1941, 73 y.o.   MRN: [id number]  HPI  Pt is a pleasant 73 y/o female with hx of HTN, HLD, anemia, hypothyroidism followed by Endocrine, IBS, RA followed by Rheumatology who presents to clinic for follow up of HTN, anemia and for review of medications. She reports that she needs refills sent to new pharmacy.  Pertaining to her HTN, she is compliant with medications. Recently she was started on hydralazine in addition to her lisinopril. She reports feeling slightly fatigued. Blood pressure is low and may be contributing to her symptom.  Pertaining to her anemia, she was seen by Dr. Grayland Ormond. She takes iron regularly without any reported side effects. Stools are dark as expected. Denies shortness of breath.  She is living at [place]. Moved to this area from [place]. Her pharmacy was delivering from [place]; however, they are charging her $9 every month delivery charge. This adds up especially for someone on a fixed income. [facility] [address]     Past Medical History  Diagnosis Date  . Hypertension   . Hyperthyroidism     s/p radiation therapy, now hypothyroidism  . IBS (irritable bowel syndrome)   . Herniated disc   . Pancreatic cyst   . Arthritis     Possible RA - Follow up appt with Dr. Jefm Bryant  . H/O transfusion of packed red blood cells 02/2013    3 units PRBC for severe anemia 02/2013  . Anemia 02/2013    F/U with Dr. Grayland Ormond for Feraheme injections  . Hyperlipidemia   . Allergy   . History of kidney stones   . Migraine   . Cellulitis of arm, right   . Paroxysmal a-fib   . Diabetes insipidus     On desmopressin    Current Outpatient Prescriptions on File Prior to Visit  Medication Sig Dispense Refill  . acetaminophen (TYLENOL) 325 MG tablet Take 2 tablets (650 mg total) by mouth  every 6 (six) hours as needed for pain.      . Calcium-Vitamin D (SUPER CALCIUM/D) 600-125 MG-UNIT TABS Take by mouth. Taking 2 daily      . Cholecalciferol (D3-1000 PO) Take by mouth daily.      . ferrous sulfate 325 (65 FE) MG tablet Take 325 mg by mouth daily with breakfast.      . gabapentin (NEURONTIN) 300 MG capsule Take 1 capsule (300 mg total) by mouth 3 (three) times daily.  90 capsule  3  . hydrALAZINE (APRESOLINE) 10 MG tablet Take 1 tablet (10 mg total) by mouth 3 (three) times daily.  30 tablet  3  . levothyroxine (SYNTHROID, LEVOTHROID) 112 MCG tablet Take 112 mcg by mouth daily before breakfast.      . nitroGLYCERIN (NITROSTAT) 0.4 MG SL tablet Place 1 tablet (0.4 mg total) under the tongue every 5 (five) minutes as needed.  25 tablet  3  . Omega-3 Fatty Acids (FISH OIL) 1000 MG CAPS Take by mouth daily.      . vitamin C (ASCORBIC ACID) 500 MG tablet Take 500 mg by mouth daily.       No current facility-administered medications on file prior to visit.     Review of Systems  Constitutional: Positive for fatigue.  Respiratory: Negative for  cough and shortness of breath.   Cardiovascular: Negative for chest pain, palpitations and leg swelling.  Genitourinary: Negative.   Neurological: Negative for dizziness, tremors, weakness, light-headedness and headaches.  Hematological: Negative.   Psychiatric/Behavioral: Negative.   All other systems reviewed and are negative.      Objective:  BP 112/50  Pulse 55  Temp(Src) 97.9 F (36.6 C) (Oral)  Resp 14  Wt 145 lb (65.772 kg)  SpO2 99%   Physical Exam  Constitutional: She is oriented to person, place, and time. She appears well-developed and well-nourished. No distress.  HENT:  Head: Normocephalic and atraumatic.  Eyes: Conjunctivae and EOM are normal.  Neck: Normal range of motion. Neck supple.  Cardiovascular: Regular rhythm, normal heart sounds and intact distal pulses.  Exam reveals no gallop and no friction rub.     No murmur heard. Bradycardia  Pulmonary/Chest: Effort normal and breath sounds normal. No respiratory distress. She has no wheezes. She has no rales.  Musculoskeletal: Normal range of motion.  Neurological: She is alert and oriented to person, place, and time. She has normal reflexes. Coordination normal.  Skin: Skin is warm and dry.  Psychiatric: She has a normal mood and affect. Her behavior is normal. Judgment and thought content normal.      Assessment & Plan:   1. HTN (hypertension) Hold hydralazine given low b/p. Continue lisinopril daily. She will monitor blood pressure. Instructed to use hydralazine as needed for systolic pressure above 914. Check labs. Continue to follow - Basic metabolic panel  2. Anemia Continue iron, check labs and continue to follow. Fatigue multifactorial 2/2 anemia, low blood pressure, medication effects. Thyroid is checked by Endocrine. Recently normal. - CBC with Differential  3. Medication management All medications reviewed with pt. She is compliant and has a very good understanding of all her meds. [facility]

## 2013-11-12 ENCOUNTER — Ambulatory Visit: Payer: Medicare Other | Admitting: Adult Health

## 2013-11-12 ENCOUNTER — Encounter: Payer: Self-pay | Admitting: *Deleted

## 2013-11-12 NOTE — Progress Notes (Signed)
Mailed labs with note to pt

## 2013-11-18 DIAGNOSIS — H4011X0 Primary open-angle glaucoma, stage unspecified: Secondary | ICD-10-CM | POA: Diagnosis not present

## 2013-11-18 DIAGNOSIS — H2510 Age-related nuclear cataract, unspecified eye: Secondary | ICD-10-CM | POA: Diagnosis not present

## 2013-11-18 DIAGNOSIS — H409 Unspecified glaucoma: Secondary | ICD-10-CM | POA: Diagnosis not present

## 2013-12-03 ENCOUNTER — Encounter: Payer: Self-pay | Admitting: Rheumatology

## 2013-12-13 ENCOUNTER — Telehealth: Payer: Self-pay

## 2013-12-13 NOTE — Telephone Encounter (Signed)
Patient called stating that at her last visit you D/C her hydrochlorothiazide and wanted her to monitor her Bp if systolic was above 979 contact the office. Patient states that her systolic has been elevated last reading today was 160/78. Patient states the lisinopril is not helping. Please advise.

## 2013-12-13 NOTE — Telephone Encounter (Signed)
Medication that I held was hydralazine and it was because her blood pressure was low. Have her reintroduce the hydralazine but only twice a day. Have her continue to monitor her blood pressure to see how it is responding.

## 2013-12-14 ENCOUNTER — Other Ambulatory Visit: Payer: Self-pay

## 2013-12-14 MED ORDER — HYDRALAZINE HCL 10 MG PO TABS: 10.0000 mg | ORAL_TABLET | Freq: Two times a day (BID) | ORAL | Status: DC

## 2013-12-14 NOTE — Telephone Encounter (Signed)
Left message on patients home voicemail informing her of Raquel's comments. Left callback number if patient had any questions. New instructions sent to pharmacy with a refill because patient stated she was all out of Hydralazine last time we spoke on 12/13/13.

## 2013-12-15 ENCOUNTER — Other Ambulatory Visit: Payer: Self-pay | Admitting: *Deleted

## 2013-12-15 ENCOUNTER — Telehealth: Payer: Self-pay | Admitting: *Deleted

## 2013-12-15 MED ORDER — LEVOTHYROXINE SODIUM 112 MCG PO TABS: 112.0000 ug | ORAL_TABLET | Freq: Every day | ORAL | Status: DC

## 2013-12-15 NOTE — Telephone Encounter (Signed)
Pt called she needs medication refill has enough for 3 days levothyroxine 112 mcg.

## 2013-12-22 ENCOUNTER — Encounter: Payer: Self-pay | Admitting: Internal Medicine

## 2013-12-22 ENCOUNTER — Encounter: Payer: Self-pay | Admitting: *Deleted

## 2013-12-22 ENCOUNTER — Ambulatory Visit (INDEPENDENT_AMBULATORY_CARE_PROVIDER_SITE_OTHER): Payer: Medicare Other | Admitting: Internal Medicine

## 2013-12-22 VITALS — BP 128/70 | HR 75 | Temp 98.2°F | Resp 12 | Wt 145.8 lb

## 2013-12-22 DIAGNOSIS — E890 Postprocedural hypothyroidism: Secondary | ICD-10-CM

## 2013-12-22 DIAGNOSIS — Z79899 Other long term (current) drug therapy: Secondary | ICD-10-CM | POA: Diagnosis not present

## 2013-12-22 LAB — T4, FREE: Free T4: 1.04 ng/dL (ref 0.60–1.60)

## 2013-12-22 LAB — TSH: TSH: 2.07 u[IU]/mL (ref 0.35–4.50)

## 2013-12-22 NOTE — Progress Notes (Signed)
Patient ID: Caroline Nichols, female   DOB: Sep 22, 1940, 73 y.o.   MRN: [id number]   HPI  Caroline Nichols is a 73 y.o.-year-old female, returning for f/u for hypothyroidism. Last visit 4 mo ago.  She had a cold and got a Prednisone taper in 09/2013.   Reviewed the hx: Pt. has been dx with hyperthyroidism and "inverted goiter" (?) in 1980s >> Dr Severe has sent her for RAI ablation >> postablative hypothyroidism >> started on Levothyroxine 88 >> 125 >> 112 mcg (had variability in the TFTs in the past).  She takes Levothyroxine 112 mcg: - fasting - with water - separated by >30 min from b'fast  - takes calcium at night - takes iron along calcium at night - takes PPIs (Protonix) at lunch  I reviewed pt's thyroid tests: - no recent TFTs or recent changes in levothyroxine dose Lab Results  Component Value Date   TSH 1.00 08/18/2013   TSH 12.303* 12/31/2012   TSH 11.793* 12/27/2012   TSH 0.87 05/15/2009   FREET4 1.19 08/18/2013   FREET4 1.53 12/27/2012    Pt denies feeling nodules in neck, + hoarseness, no dysphagia/odynophagia, SOB with lying down.  Pt describes slightly improving sxs: - + cold and hot intolerance - no  weight gain  - + fatigue - no constipation/+ loose stools (IBS) - + dry skin - no hair falling - no depression - + anxiety  She has cataracts and glaucoma >> seeing the eye dr.   Leonard Downing: Constitutional: see HPI, + poor sleep - improved, + nocturia Eyes: + blurry vision, no xerophthalmia ENT: no sore throat, no  nodules palpated in throat, no dysphagia/odynophagia, + occasional hoarseness Cardiovascular: no CP/+ SOB/no palpitations/less leg swelling now Respiratory: no cough/no SOB Gastrointestinal: no N/V/D/C, + heartburn Musculoskeletal: + muscle/+ joint aches Skin: no rashes Neurological: no tremors/numbness/tingling/dizziness, + HA  PE: BP 128/70  Pulse 75  Temp(Src) 98.2 F (36.8 C) (Oral)  Resp 12  Wt 145 lb 12.8 oz (66.134 kg)  SpO2 95% Wt Readings from  Last 3 Encounters:  12/22/13 145 lb 12.8 oz (66.134 kg)  11/11/13 145 lb (65.772 kg)  10/07/13 146 lb (66.225 kg)   Constitutional: normal weight, in NAD Eyes: PERRLA, EOMI, + exophthalmos, no lid lag, no stare ENT: moist mucous membranes, no thyromegaly, no thyroid nodules palpated, no cervical lymphadenopathy Cardiovascular: RRR, No MRG Respiratory: CTA B Gastrointestinal: abdomen soft, NT, ND, BS+ Musculoskeletal: no deformities, strength intact in all 4 Skin: moist, warm, no rashes Neurological: no tremor with outstretched hands, DTR normal in all 4  ASSESSMENT: 1. Hypothyroidism - post RAI tx for Hyperthyroidism (likely Graves ds)  PLAN:  1. Patient with long-standing iatrogenic hypothyroidism, on levothyroxine therapy. She appears euthyroid. - she is taking the levothyroxine correctly, fasting, with water, separated by at least 30 minutes from breakfast, and separated by more than 4 hours from calcium, iron, multivitamins, acid reflux medications (PPIs). - She does not appear to have a goiter, thyroid nodules, or neck compression symptoms - we'll check thyroid tests today: TSH, free T4 - If these are abnormal, she will need to return in 6-8 weeks for repeat labs - If these are normal, I will see her back in 1 year  Office Visit on 12/22/2013  Component Date Value Ref Range Status  . TSH 12/22/2013 2.07  0.35 - 4.50 uIU/mL Final  . Free T4 12/22/2013 1.04  0.60 - 1.60 ng/dL Final

## 2013-12-22 NOTE — Patient Instructions (Signed)
Please stop at the lab.  Please return in 1 year.  

## 2013-12-28 ENCOUNTER — Telehealth: Payer: Self-pay | Admitting: Adult Health

## 2013-12-28 NOTE — Telephone Encounter (Signed)
The patient's daughter called stating that the patient is having allergy issues and she is needing a call from the nurse to see if she needs a Z-pack called in to the pharmacy or if she is needing to be seen by the doctor.

## 2013-12-28 NOTE — Telephone Encounter (Signed)
Left message for pt to return my call, requesting to make an appt if needing antibiotic

## 2013-12-29 ENCOUNTER — Ambulatory Visit (INDEPENDENT_AMBULATORY_CARE_PROVIDER_SITE_OTHER): Payer: Medicare Other | Admitting: Adult Health

## 2013-12-29 ENCOUNTER — Encounter: Payer: Self-pay | Admitting: Adult Health

## 2013-12-29 VITALS — BP 118/68 | HR 76 | Temp 97.8°F | Resp 16 | Ht 62.0 in | Wt 141.5 lb

## 2013-12-29 DIAGNOSIS — J069 Acute upper respiratory infection, unspecified: Secondary | ICD-10-CM

## 2013-12-29 NOTE — Progress Notes (Signed)
Pre visit review using our clinic review tool, if applicable. No additional management support is needed unless otherwise documented below in the visit note. 

## 2013-12-29 NOTE — Patient Instructions (Signed)
   TREATMENT   Nose Sprays: Saline nasal spray and Afrin nasal spray for severe congestion. Do not use Afrin more than 3 days   Cough medicine - Delsym or Robitussin   Tylenol for general aches or low grade fever  Chloraseptic spray for sore throat.  Try an antihistamine for the watery eyes and runny nose such as allegra, claritin or zyrtec.  Stay hydrated  Call if you are not any better by Friday morning.

## 2013-12-29 NOTE — Progress Notes (Signed)
Patient ID: Caroline Nichols, female   DOB: [birth year], 73 y.o.   MRN: [id number]   Subjective:    Patient ID: Caroline Nichols, female    DOB: [birth year], 73 y.o.   MRN: [id number]  HPI  Pt is a pleasant 73 y/o female who presents to clinic with upper respiratory infection symptoms including cough, rhinorrhea, irritation of her throat, and some sinus pressure and nasal congestion. In addition, pt is also experiencing watery eyes. Symptoms started on Sunday. She has taken tylenol. Denies fever, chills. She does not feel bad.   Past Medical History  Diagnosis Date  . Hypertension   . Hyperthyroidism     s/p radiation therapy, now hypothyroidism  . IBS (irritable bowel syndrome)   . Herniated disc   . Pancreatic cyst   . Arthritis     Possible RA - Follow up appt with Dr. Jefm Bryant  . H/O transfusion of packed red blood cells 02/2013    3 units PRBC for severe anemia 02/2013  . Anemia 02/2013    F/U with Dr. Grayland Ormond for Feraheme injections  . Hyperlipidemia   . Allergy   . History of kidney stones   . Migraine   . Cellulitis of arm, right   . Paroxysmal a-fib   . Diabetes insipidus     On desmopressin    Current Outpatient Prescriptions on File Prior to Visit  Medication Sig Dispense Refill  . acetaminophen (TYLENOL) 325 MG tablet Take 2 tablets (650 mg total) by mouth every 6 (six) hours as needed for pain.      Marland Kitchen ALPRAZolam (XANAX) 0.5 MG tablet Take 1 tablet (0.5 mg total) by mouth 3 (three) times daily as needed for anxiety.  90 tablet  5  . Calcium-Vitamin D (SUPER CALCIUM/D) 600-125 MG-UNIT TABS Take by mouth. Taking 2 daily      . Cholecalciferol (D3-1000 PO) Take by mouth daily.      Marland Kitchen desmopressin (DDAVP) 0.2 MG tablet Take 0.5 tablets (0.1 mg total) by mouth at bedtime.  30 tablet  6  . ferrous sulfate 325 (65 FE) MG tablet Take 325 mg by mouth daily with breakfast.      . folic acid (FOLVITE) 1 MG tablet Take 1 mg by mouth daily.      Marland Kitchen gabapentin (NEURONTIN) 300 MG capsule  Take 1 capsule (300 mg total) by mouth 3 (three) times daily.  90 capsule  3  . hydrALAZINE (APRESOLINE) 10 MG tablet Take 1 tablet (10 mg total) by mouth 2 (two) times daily.  30 tablet  3  . hydroxychloroquine (PLAQUENIL) 200 MG tablet Take 2 tablets (400 mg total) by mouth daily.  60 tablet  6  . levothyroxine (SYNTHROID, LEVOTHROID) 112 MCG tablet Take 1 tablet (112 mcg total) by mouth daily before breakfast.  30 tablet  0  . lisinopril (PRINIVIL,ZESTRIL) 10 MG tablet Take 1 tablet (10 mg total) by mouth daily.  30 tablet  6  . nitroGLYCERIN (NITROSTAT) 0.4 MG SL tablet Place 1 tablet (0.4 mg total) under the tongue every 5 (five) minutes as needed.  25 tablet  3  . Omega-3 Fatty Acids (FISH OIL) 1000 MG CAPS Take by mouth daily.      . pantoprazole (PROTONIX) 40 MG tablet Take 1 tablet (40 mg total) by mouth daily.  30 tablet  0  . vitamin C (ASCORBIC ACID) 500 MG tablet Take 500 mg by mouth daily.       No current facility-administered medications on  file prior to visit.     Review of Systems  Constitutional: Negative for fever and chills.  HENT: Positive for congestion, postnasal drip, rhinorrhea, sinus pressure, sneezing and sore throat (mildly irritated throat).   Eyes:       Watery eyes  Respiratory: Positive for cough. Negative for chest tightness, shortness of breath and wheezing.   Cardiovascular: Negative for chest pain.  Musculoskeletal:       Swollen and painful right hand s/p injury       Objective:  BP 118/68  Pulse 76  Temp(Src) 97.8 F (36.6 C) (Oral)  Resp 16  Ht 5\' 2"  (1.575 m)  Wt 141 lb 8 oz (64.184 kg)  BMI 25.87 kg/m2  SpO2 97%   Physical Exam  Constitutional: She is oriented to person, place, and time. She appears well-developed and well-nourished. No distress.  HENT:  Head: Normocephalic and atraumatic.  Right Ear: External ear normal.  Left Ear: External ear normal.  Mouth/Throat: No oropharyngeal exudate.  Cardiovascular: Normal rate, regular  rhythm and normal heart sounds.  Exam reveals no gallop.   No murmur heard. Pulmonary/Chest: Effort normal and breath sounds normal. No respiratory distress. She has no wheezes. She has no rales.  Lymphadenopathy:    She has no cervical adenopathy.  Neurological: She is alert and oriented to person, place, and time.  Psychiatric: She has a normal mood and affect. Her behavior is normal. Judgment and thought content normal.      Assessment & Plan:   1. URI, acute Suspect viral. May have some allergic component. Afebrile. Supportive care with OTC meds. See pt instructions for full POC Call if no improvement by Friday morning.

## 2013-12-29 NOTE — Telephone Encounter (Signed)
Pt here for office visit.

## 2013-12-30 ENCOUNTER — Other Ambulatory Visit: Payer: Self-pay | Admitting: Adult Health

## 2013-12-31 ENCOUNTER — Telehealth: Payer: Self-pay

## 2013-12-31 ENCOUNTER — Other Ambulatory Visit: Payer: Self-pay | Admitting: Adult Health

## 2013-12-31 MED ORDER — AMOXICILLIN-POT CLAVULANATE 875-125 MG PO TABS: 1.0000 | ORAL_TABLET | Freq: Two times a day (BID) | ORAL | Status: DC

## 2013-12-31 NOTE — Telephone Encounter (Signed)
Sent in order for Augmentin 1 tab twice a day for 10 days. Continue with an over the counter cough medication. Please make sure she is not running a fever. If she tells you she is, please let me know ASAP

## 2013-12-31 NOTE — Telephone Encounter (Signed)
Spoke with patient today stated that she is not feeling any better since she was seen in office 12/29/13. She states that she is coughing more with yellowish mucus. Unable to sleep due to continuous coughing. Please advise.

## 2013-12-31 NOTE — Telephone Encounter (Signed)
Spoke to patient's daughter per patient's request. Notified [name] that the Augmentin was sent to the pharmacy with instructions. Daughter verbalized understanding. While on the phone with patient she stated that she is not running a temp, she doesn't feel well.

## 2014-01-17 ENCOUNTER — Other Ambulatory Visit: Payer: Self-pay | Admitting: Internal Medicine

## 2014-02-04 ENCOUNTER — Other Ambulatory Visit: Payer: Self-pay | Admitting: Adult Health

## 2014-02-08 ENCOUNTER — Encounter: Payer: Self-pay | Admitting: Adult Health

## 2014-02-09 ENCOUNTER — Other Ambulatory Visit: Payer: Self-pay | Admitting: Adult Health

## 2014-02-09 NOTE — Telephone Encounter (Signed)
Last refill 6.10.15, last OV 5.27.15, no future OV.  Please advise refill

## 2014-02-14 ENCOUNTER — Other Ambulatory Visit: Payer: Self-pay | Admitting: Adult Health

## 2014-02-24 DIAGNOSIS — H4011X0 Primary open-angle glaucoma, stage unspecified: Secondary | ICD-10-CM | POA: Diagnosis not present

## 2014-02-24 DIAGNOSIS — H409 Unspecified glaucoma: Secondary | ICD-10-CM | POA: Diagnosis not present

## 2014-02-24 DIAGNOSIS — H2510 Age-related nuclear cataract, unspecified eye: Secondary | ICD-10-CM | POA: Diagnosis not present

## 2014-03-02 DIAGNOSIS — R748 Abnormal levels of other serum enzymes: Secondary | ICD-10-CM | POA: Diagnosis not present

## 2014-03-02 DIAGNOSIS — D72819 Decreased white blood cell count, unspecified: Secondary | ICD-10-CM | POA: Diagnosis not present

## 2014-03-02 DIAGNOSIS — D649 Anemia, unspecified: Secondary | ICD-10-CM | POA: Diagnosis not present

## 2014-03-02 DIAGNOSIS — M064 Inflammatory polyarthropathy: Secondary | ICD-10-CM | POA: Diagnosis not present

## 2014-03-02 LAB — CBC AND DIFFERENTIAL
HCT: 34 % — AB (ref 36–46)
Hemoglobin: 11.3 g/dL — AB (ref 12.0–16.0)
NEUTROS ABS: 1 /uL
Platelets: 129 10*3/uL — AB (ref 150–399)
WBC: 2.8 10^3/mL

## 2014-03-03 DIAGNOSIS — I8310 Varicose veins of unspecified lower extremity with inflammation: Secondary | ICD-10-CM | POA: Diagnosis not present

## 2014-03-03 DIAGNOSIS — M79609 Pain in unspecified limb: Secondary | ICD-10-CM | POA: Diagnosis not present

## 2014-03-03 DIAGNOSIS — I70219 Atherosclerosis of native arteries of extremities with intermittent claudication, unspecified extremity: Secondary | ICD-10-CM | POA: Diagnosis not present

## 2014-03-03 DIAGNOSIS — M7989 Other specified soft tissue disorders: Secondary | ICD-10-CM | POA: Diagnosis not present

## 2014-03-15 DIAGNOSIS — H2510 Age-related nuclear cataract, unspecified eye: Secondary | ICD-10-CM | POA: Diagnosis not present

## 2014-03-15 DIAGNOSIS — H4011X0 Primary open-angle glaucoma, stage unspecified: Secondary | ICD-10-CM | POA: Diagnosis not present

## 2014-03-15 DIAGNOSIS — H409 Unspecified glaucoma: Secondary | ICD-10-CM | POA: Diagnosis not present

## 2014-03-16 ENCOUNTER — Other Ambulatory Visit: Payer: Self-pay | Admitting: *Deleted

## 2014-03-16 MED ORDER — GABAPENTIN 300 MG PO CAPS: ORAL_CAPSULE | ORAL | Status: DC

## 2014-03-16 MED ORDER — LISINOPRIL 10 MG PO TABS: 10.0000 mg | ORAL_TABLET | Freq: Every day | ORAL | Status: DC

## 2014-03-16 NOTE — Telephone Encounter (Signed)
Requests 90 day supply

## 2014-03-23 ENCOUNTER — Other Ambulatory Visit: Payer: Self-pay | Admitting: *Deleted

## 2014-03-23 MED ORDER — LEVOTHYROXINE SODIUM 112 MCG PO TABS: ORAL_TABLET | ORAL | Status: DC

## 2014-04-21 ENCOUNTER — Other Ambulatory Visit: Payer: Self-pay | Admitting: *Deleted

## 2014-04-21 MED ORDER — HYDRALAZINE HCL 10 MG PO TABS
ORAL_TABLET | ORAL | Status: DC
Start: 2014-04-21 — End: 2014-07-14

## 2014-04-22 DIAGNOSIS — Z23 Encounter for immunization: Secondary | ICD-10-CM | POA: Diagnosis not present

## 2014-05-26 NOTE — Telephone Encounter (Signed)
error 

## 2014-06-06 ENCOUNTER — Encounter: Payer: Self-pay | Admitting: Adult Health

## 2014-06-06 ENCOUNTER — Other Ambulatory Visit: Payer: Self-pay | Admitting: *Deleted

## 2014-06-06 NOTE — Telephone Encounter (Signed)
Has appt scheduled 06/09/14, refill?

## 2014-06-08 NOTE — Telephone Encounter (Signed)
Can refill at 06/09/14 appointment

## 2014-06-08 NOTE — Telephone Encounter (Signed)
No, not until seen .  Should have enough till Nov 9th

## 2014-06-09 ENCOUNTER — Other Ambulatory Visit: Payer: Self-pay | Admitting: Adult Health

## 2014-06-09 ENCOUNTER — Ambulatory Visit (INDEPENDENT_AMBULATORY_CARE_PROVIDER_SITE_OTHER): Payer: Medicare Other | Admitting: Internal Medicine

## 2014-06-09 ENCOUNTER — Encounter: Payer: Self-pay | Admitting: Internal Medicine

## 2014-06-09 VITALS — BP 158/90 | HR 84 | Temp 98.4°F | Resp 16 | Ht 62.0 in | Wt 149.8 lb

## 2014-06-09 DIAGNOSIS — E785 Hyperlipidemia, unspecified: Secondary | ICD-10-CM | POA: Diagnosis not present

## 2014-06-09 DIAGNOSIS — I8312 Varicose veins of left lower extremity with inflammation: Secondary | ICD-10-CM

## 2014-06-09 DIAGNOSIS — R739 Hyperglycemia, unspecified: Secondary | ICD-10-CM

## 2014-06-09 DIAGNOSIS — R351 Nocturia: Secondary | ICD-10-CM | POA: Diagnosis not present

## 2014-06-09 DIAGNOSIS — Z90710 Acquired absence of both cervix and uterus: Secondary | ICD-10-CM | POA: Diagnosis not present

## 2014-06-09 DIAGNOSIS — R7301 Impaired fasting glucose: Secondary | ICD-10-CM

## 2014-06-09 DIAGNOSIS — I8311 Varicose veins of right lower extremity with inflammation: Secondary | ICD-10-CM | POA: Diagnosis not present

## 2014-06-09 DIAGNOSIS — I10 Essential (primary) hypertension: Secondary | ICD-10-CM | POA: Diagnosis not present

## 2014-06-09 DIAGNOSIS — I70219 Atherosclerosis of native arteries of extremities with intermittent claudication, unspecified extremity: Secondary | ICD-10-CM

## 2014-06-09 DIAGNOSIS — I872 Venous insufficiency (chronic) (peripheral): Secondary | ICD-10-CM

## 2014-06-09 DIAGNOSIS — E232 Diabetes insipidus: Secondary | ICD-10-CM

## 2014-06-09 DIAGNOSIS — I739 Peripheral vascular disease, unspecified: Secondary | ICD-10-CM | POA: Diagnosis not present

## 2014-06-09 LAB — LIPID PANEL
CHOL/HDL RATIO: 4
CHOLESTEROL: 203 mg/dL — AB (ref 0–200)
HDL: 52.7 mg/dL (ref >39.00)
LDL Cholesterol: 128 mg/dL — ABNORMAL HIGH (ref 0–99)
NonHDL: 150.3
TRIGLYCERIDES: 111 mg/dL (ref 0.0–149.0)
VLDL: 22.2 mg/dL (ref 0.0–40.0)

## 2014-06-09 LAB — URINALYSIS, ROUTINE W REFLEX MICROSCOPIC
BILIRUBIN URINE: NEGATIVE
Hgb urine dipstick: NEGATIVE
Ketones, ur: NEGATIVE
LEUKOCYTES UA: NEGATIVE
Nitrite: NEGATIVE
Specific Gravity, Urine: 1.015 (ref 1.000–1.030)
Total Protein, Urine: NEGATIVE
Urine Glucose: NEGATIVE
Urobilinogen, UA: 0.2 (ref 0.0–1.0)
pH: 6 (ref 5.0–8.0)

## 2014-06-09 LAB — COMPREHENSIVE METABOLIC PANEL
ALT: 26 U/L (ref 0–35)
AST: 45 U/L — ABNORMAL HIGH (ref 0–37)
Albumin: 3.7 g/dL (ref 3.5–5.2)
Alkaline Phosphatase: 58 U/L (ref 39–117)
BUN: 22 mg/dL (ref 6–23)
CALCIUM: 9.5 mg/dL (ref 8.4–10.5)
CHLORIDE: 106 meq/L (ref 96–112)
CO2: 25 mEq/L (ref 19–32)
CREATININE: 0.8 mg/dL (ref 0.4–1.2)
GFR: 89.16 mL/min (ref >60.00)
Glucose, Bld: 58 mg/dL — ABNORMAL LOW (ref 70–99)
Potassium: 4.2 mEq/L (ref 3.5–5.1)
Sodium: 141 mEq/L (ref 135–145)
Total Bilirubin: 0.4 mg/dL (ref 0.2–1.2)
Total Protein: 7.8 g/dL (ref 6.0–8.3)

## 2014-06-09 LAB — MICROALBUMIN / CREATININE URINE RATIO
Creatinine,U: 70.7 mg/dL
MICROALB UR: 0.7 mg/dL (ref 0.0–1.9)
MICROALB/CREAT RATIO: 1 mg/g (ref 0.0–30.0)

## 2014-06-09 LAB — HEMOGLOBIN A1C: Hgb A1c MFr Bld: 5.3 % (ref 4.6–6.5)

## 2014-06-09 NOTE — Telephone Encounter (Signed)
Ad appt today, ok refill?

## 2014-06-09 NOTE — Telephone Encounter (Signed)
ALPRAZolam (XANAX) 0.5 MG tablet  °

## 2014-06-09 NOTE — Progress Notes (Signed)
Pre-visit discussion using our clinic review tool. No additional management support is needed unless otherwise documented below in the visit note.  

## 2014-06-09 NOTE — Progress Notes (Signed)
Patient ID: Caroline Nichols, female   DOB: 1941-06-05, 73 y.o.   MRN: [id number]   Patient Active Problem List   Diagnosis Date Noted  . Hyperglycemia 06/12/2014  . Diabetes insipidus 06/12/2014  . S/P hysterectomy 06/09/2014  . HTN (hypertension) 11/11/2013  . Medication management 11/11/2013  . Atherosclerotic peripheral vascular disease with intermittent claudication 10/07/2013  . Edema of both legs 10/07/2013  . Venous stasis dermatitis 10/07/2013  . Chest pain 03/26/2013  . Left carotid bruit 03/26/2013  . Anemia of chronic disease 03/20/2013  . Polyarthralgia 03/20/2013  . Former smoker 03/20/2013  . Pain in clavicular joint 12/26/2012  . IBS (irritable bowel syndrome)   . Degenerative arthritis of lumbar spine   . MURMUR 09/27/2009  . Other postablative hypothyroidism 05/15/2009  . ANXIETY STATE, UNSPECIFIED 05/15/2009  . HYPERTENSION 05/15/2009    Subjective:  CC:   Chief Complaint  Patient presents with  . Follow-up    general follow up  . Diabetes    Patient stated she has Diabetes insipidus.    HPI:   Caroline Nichols is a 73 y.o. female who presents for   6 month follow up  On multiple issues previously managed by Raquel Rey.  Patient has a  History of  DI,  And polyarhtritis, inflammatory managed with desmopressin and placquenil.  Sees [facility] and [facility].  Up to date on eye exams.   She has a history of Diabetes insipidis for the past several years  Petra Kuba of diagnosis unclear.  Has been taking desmopressin,  Last several sodiums have  Been normal   She has no prior history of DM Type 2 but has been checking her blood  sugars frequently at home using an old glucometer.  Concerned that she has DM because she reports that today's was 130 fasting.  Dry mouth getting a lot worse, dry eye too , has not discussed with [facility] rheumatologist.     Past Medical History  Diagnosis Date  . Hypertension   . Hyperthyroidism     s/p  radiation therapy, now hypothyroidism  . IBS (irritable bowel syndrome)   . Herniated disc   . Pancreatic cyst   . Arthritis     Possible RA - Follow up appt with Dr. Jefm Bryant  . H/O transfusion of packed red blood cells 02/2013    3 units PRBC for severe anemia 02/2013  . Anemia 02/2013    F/U with Dr. Grayland Ormond for Feraheme injections  . Hyperlipidemia   . Allergy   . History of kidney stones   . Migraine   . Cellulitis of arm, right   . Paroxysmal a-fib   . Diabetes insipidus     On desmopressin    Past Surgical History  Procedure Laterality Date  . Tubal ligation    . Cervical spine surgery      Bone fusion  . Hammer toe surgery    . Hernia repair    . Abdominal hysterectomy    . Breast surgery  1990s    Biopsy       The following portions of the patient's history were reviewed and updated as appropriate: Allergies, current medications, and problem list.    Review of Systems:   Patient denies headache, fevers, malaise, unintentional weight loss, skin rash, eye pain, sinus congestion and sinus pain, sore throat, dysphagia,  hemoptysis , cough, dyspnea, wheezing, chest pain, palpitations, orthopnea, edema, abdominal pain, nausea, melena, diarrhea, constipation, flank pain, dysuria, hematuria, urinary  Frequency, nocturia, numbness, tingling, seizures,  Focal weakness, Loss of consciousness,  Tremor, insomnia, depression, anxiety, and suicidal ideation.     History   Social History  . Marital Status: Widowed    Spouse Name: N/A    Number of Children: 4  . Years of Education: 12   Occupational History  . Textiles     Retired  . [employer]     Retired   Social History Main Topics  . Smoking status: Former Smoker -- 0.50 packs/day for 30 years    Types: Cigarettes    Quit date: 12/03/2012  . Smokeless tobacco: Never Used  . Alcohol Use: No  . Drug Use: No  . Sexual Activity: No   Other Topics Concern  . Not on file   Social History  Narrative   Ms. Magistro was born and reared in [place]. She is a widow since 79. She was married for 48 years. They had 4 children (daughters). She is currently leaving at [facility] since June. 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 She enjoys shopping. She also enjoys music, word puzzles and she loves spending time with her family. She loves attending church. One of her favorite things is wearing hats. She absolutely loves hats!    Objective:  Filed Vitals:   06/09/14 1122  BP: 158/90  Pulse: 84  Temp: 98.4 F (36.9 C)  Resp: 16     General appearance: alert, cooperative and appears stated age Ears: normal TM's and external ear canals both ears Throat: lips, mucosa, and tongue normal; teeth and gums normal Neck: no adenopathy, no carotid bruit, supple, symmetrical, trachea midline and thyroid not enlarged, symmetric, no tenderness/mass/nodules Back: symmetric, no curvature. ROM normal. No CVA tenderness. Lungs: clear to auscultation bilaterally Heart: regular rate and rhythm, S1, S2 normal, no murmur, click, rub or gallop Abdomen: soft, non-tender; bowel sounds normal; no masses,  no organomegaly Pulses: 2+ and symmetric Skin: Skin color, texture, turgor normal. No rashes or lesions Lymph nodes: Cervical, supraclavicular, and axillary nodes normal.  Assessment and Plan:  HTN (hypertension) Well controlled on current regimen. Renal function stable, no changes today.  Lab Results  Component Value Date   CREATININE 0.8 06/09/2014   Lab Results  Component Value Date   NA 141 06/09/2014   K 4.2 06/09/2014   CL 106 06/09/2014   CO2 25 06/09/2014     Atherosclerotic peripheral vascular disease with intermittent claudication PER EVALUATION BY [facility],  abiS ARE NONCRITICAL AND DO NOT REQUIRE SURGICAL INTERVENTION,  ANNUAL FOLLOW UP,  EXERCISE AND CONTROL OF HYPERLIPIDEMIA ADVISED.  Will  recommend treatment of hyperlipidemia with goal LDL < 100 and continued use of daily aspirin.  Lab Results  Component Value Date   CHOL 203* 06/09/2014   HDL 52.70 06/09/2014   LDLCALC 128* 06/09/2014   TRIG 111.0 06/09/2014   CHOLHDL 4 06/09/2014     Venous stasis dermatitis With previously noted edema improved with suspension of amlodipine .  Can use compression stockings with caution given history of PAD  Hyperglycemia Home fasting sugars have been diagnostic of new onset DM Type 2.  However she has an entirely normal a1c. Today, no micralbuminuria or glucosuria,  Fasting glucose was a little low on serum test which may indicate some dysregulation and future DM but currently there is no indication for diagnosis of DM or for medications.  Low  glycemic index diet discussed as a healthier lifestyle.   Lab Results  Component Value Date   HGBA1C 5.3 06/09/2014     Diabetes insipidus Etiology unclear.  Has been stable on current therapy for over a year  Prior workup and clarification of diagnosis was attempted by [facility]  (Dr. Rock Nephew, per review of available records ) but incomplete . Given her age and normal sodium levels on current desmopressin therapy,  I am reluctant to change her medications or suspend them in order to clarify diagnosis.  Will defer to current endocrinology.   A total of 40 minutes was spent with patient more than half of which was spent in counseling patient on the above mentioned issues , reviewing and explaining recent labs and imaging studies done, and coordination of care.   Updated Medication List Outpatient Encounter Prescriptions as of 06/09/2014  Medication Sig  . acetaminophen (TYLENOL) 325 MG tablet Take 2 tablets (650 mg total) by mouth every 6 (six) hours as needed for pain.  . Calcium-Vitamin D (SUPER CALCIUM/D) 600-125 MG-UNIT TABS Take by mouth. Taking 2 daily  . Cholecalciferol (D3-1000 PO) Take by mouth daily.  Marland Kitchen desmopressin (DDAVP) 0.2  MG tablet Take 0.5 tablets (0.1 mg total) by mouth at bedtime.  . ferrous sulfate 325 (65 FE) MG tablet Take 325 mg by mouth daily with breakfast.  . folic acid (FOLVITE) 1 MG tablet Take 1 mg by mouth daily.  Marland Kitchen gabapentin (NEURONTIN) 300 MG capsule TAKE 1 CAPSULE (300 MG TOTAL) BY MOUTH 3 (THREE) TIMES DAILY.  . hydrALAZINE (APRESOLINE) 10 MG tablet TAKE 1 TABLET BY MOUTH TWO TIMES DAILY  . hydrALAZINE (APRESOLINE) 10 MG tablet TAKE 1 TABLET BY MOUTH TWO TIMES DAILY  . hydroxychloroquine (PLAQUENIL) 200 MG tablet Take 2 tablets (400 mg total) by mouth daily.  Marland Kitchen levothyroxine (SYNTHROID, LEVOTHROID) 112 MCG tablet TAKE 1 TABLET (112 MCG TOTAL) BY MOUTH DAILY BEFORE BREAKFAST.  Marland Kitchen lisinopril (PRINIVIL,ZESTRIL) 10 MG tablet Take 1 tablet (10 mg total) by mouth daily.  . nitroGLYCERIN (NITROSTAT) 0.4 MG SL tablet Place 1 tablet (0.4 mg total) under the tongue every 5 (five) minutes as needed.  . Omega-3 Fatty Acids (FISH OIL) 1000 MG CAPS Take by mouth daily.  . pantoprazole (PROTONIX) 40 MG tablet TAKE 1 TABLET (40 MG TOTAL) BY MOUTH DAILY.  . vitamin C (ASCORBIC ACID) 500 MG tablet Take 500 mg by mouth daily.  . [DISCONTINUED] ALPRAZolam (XANAX) 0.5 MG tablet Take 1 tablet (0.5 mg total) by mouth 3 (three) times daily as needed for anxiety.  Marland Kitchen atorvastatin (LIPITOR) 20 MG tablet Take 1 tablet (20 mg total) by mouth daily.  . [DISCONTINUED] amoxicillin-clavulanate (AUGMENTIN) 875-125 MG per tablet Take 1 tablet by mouth 2 (two) times daily.     Orders Placed This Encounter  Procedures  . Comprehensive metabolic panel  . Hemoglobin A1c  . Microalbumin / creatinine urine ratio  . Lipid panel  . Urinalysis, dipstick only  . Urinalysis, Routine w reflex microscopic  . Microalbumin / creatinine urine ratio  . Urinalysis, Routine w reflex microscopic    Return in about 3 months (around 09/09/2014) for follow up diabetes.

## 2014-06-09 NOTE — Patient Instructions (Signed)
Please  Have your  blood pressure checked and let me know the reading   I have given you a new meter to check you blood sugars  Your last fasting glucose indicates you are at risk for developing diabetes, so I am checking an A1c today    This is  my version of a  "Low GI"  Diet:  It will still lower your blood sugars and allow you to lose 4 to 8  lbs  per month if you follow it carefully.  Your goal with exercise is a minimum of 30 minutes of aerobic exercise 5 days per week (Walking does not count once it becomes easy!)     All of the foods can be found at grocery stores and in bulk at [employer].  The Atkins protein bars and shakes are available in more varieties at [employer], [employer] and [place].     7 AM Breakfast:  Choose from the following:  Low carbohydrate Protein  Shakes (I recommend the "Pure Protein gluten free Protein shake available at [facility] ) instead of oatmeal and cereal     A slice of home made fritatta (egg based dish without a crust:  google it)    Avoid cereal and bananas, oatmeal and cream of wheat and grits. They are loaded with carbohydrates!   10 AM: high protein snack  Protein bar by Atkins (the snack size, under 200 cal, usually < 6 net carbs).    A stick of cheese:  Around 1 carb,  100 cal     Dannon Light n Fit [place] Yogurt  (80 cal, 8 carbs)  Other so called "protein bars" and Greek yogurts tend to be loaded with carbohydrates.  Remember, in food advertising, the word "energy" is synonymous for " carbohydrate."  Lunch:   A Sandwich using the bread choices listed, Can use any  Eggs,  lunchmeat, grilled meat or canned tuna), avocado, regular mayo/mustard  and cheese.  A Salad using blue cheese, ranch,  Goddess or vinagrette,  No croutons or "confetti" and no "candied nuts" but regular nuts OK.   No pretzels or chips.  Pickles and miniature sweet peppers are a good low carb alternative that provide a "crunch"  The bread is the only source of carbohydrate in a  sandwich and  can be decreased by trying some of these alternatives to traditional loaf bread  Joseph's makes a pita bread and a flat bread that are 50 cal and 4 net carbs available at [place].  This can be toasted to use with hummous as well  Toufayan makes a low carb flatbread that's 100 cal and 9 net carbs available at [employer] and [employer] makes 2 sizes of  Low carb whole wheat tortilla  (The large one is 210 cal and 6 net carbs)  Flat Out makes flatbreads that are low carb as well  Avoid "Low fat dressings, as well as Barry Brunner and [place] dressings They are loaded with sugar!   3 PM/ Mid day  Snack:  Consider  1 ounce of  almonds, walnuts, pistachios, pecans, peanuts,  Macadamia nuts or a nut medley.  Avoid "granola"; the dried cranberries and raisins are loaded with carbohydrates. Mixed nuts as long as there are no raisins,  cranberries or dried fruit.    Try the prosciutto/mozzarella cheese sticks by [employer]  In deli /backery section   High protein   To avoid overindulging in snacks: Try drinking a glass  of unsweeted almond/coconut milk  Or a cup of coffee with your Atkins chocolate bar to keep you from having 3!!!   Pork rinds!  Yes Pork Rinds        6 PM  Dinner:     Meat/fowl/fish with a green salad, and either broccoli, cauliflower, green beans, spinach, brussel sprouts or  Lima beans. DO NOT BREAD THE PROTEIN!!      There is a low carb pasta by Dreamfield's that is acceptable and tastes great: only 5 digestible carbs/serving.( All grocery stores but [employer]Js carry it )  Try Hurley Cisco Angelo's chicken piccata or chicken or eggplant parm over low carb pasta.(Lowes and BJs)   Marjory Lies Sanchez's "Carnitas" (pulled pork, no sauce,  0 carbs) or his beef pot roast to make a dinner burrito (at [facility])  Pesto over low carb pasta (bj's sells a good quality pesto in the center refrigerated section of the deli   Try satueeing  Cheral Marker with mushroooms  Whole wheat pasta is still  full of digestible carbs and  Not as low in glycemic index as Dreamfield's.   Brown rice is still rice,  So skip the rice and noodles if you eat [place] or [place] (or at least limit to 1/2 cup)  9 PM snack :   Breyer's "low carb" fudgsicle or  ice cream bar (Carb Smart line), or  Weight Watcher's ice cream bar , or another "no sugar added" ice cream;  a serving of fresh berries/cherries with whipped cream   Cheese or DANNON'S LlGHT N FIT GREEK YOGURT or the Oikos greek yogurt   8 ounces of [place] unsweetened almond/cococunut milk  Cheese and crackers (using WASA crackers,  They are low carb) or peanut butter on low carb crackers or pita bread     Avoid bananas, pineapple, grapes  and watermelon on a regular basis because they are high in sugar.  THINK OF THEM AS DESSERT  Remember that snack Substitutions should be less than 10 NET carbs per serving and meals should be < 25 net carbs. Remember that carbohydrates from fiber do not affect blood sugar, so you can  subtract fiber grams to get the "net carbs " of any particular food item.

## 2014-06-10 MED ORDER — ALPRAZOLAM 0.5 MG PO TABS: 0.5000 mg | ORAL_TABLET | Freq: Three times a day (TID) | ORAL | Status: DC | PRN

## 2014-06-10 NOTE — Telephone Encounter (Signed)
Ok to refill,  printed rx  

## 2014-06-12 DIAGNOSIS — R739 Hyperglycemia, unspecified: Secondary | ICD-10-CM | POA: Insufficient documentation

## 2014-06-12 DIAGNOSIS — E232 Diabetes insipidus: Secondary | ICD-10-CM | POA: Insufficient documentation

## 2014-06-12 MED ORDER — ATORVASTATIN CALCIUM 20 MG PO TABS: 20.0000 mg | ORAL_TABLET | Freq: Every day | ORAL | Status: DC

## 2014-06-12 NOTE — Assessment & Plan Note (Addendum)
PER EVALUATION BY [facility],  abiS ARE NONCRITICAL AND DO NOT REQUIRE SURGICAL INTERVENTION,  ANNUAL FOLLOW UP,  EXERCISE AND CONTROL OF HYPERLIPIDEMIA ADVISED.  Will recommend treatment of hyperlipidemia with goal LDL < 100 and continued use of daily aspirin.  Lab Results  Component Value Date   CHOL 203* 06/09/2014   HDL 52.70 06/09/2014   LDLCALC 128* 06/09/2014   TRIG 111.0 06/09/2014   CHOLHDL 4 06/09/2014

## 2014-06-12 NOTE — Assessment & Plan Note (Signed)
Etiology unclear.  Has been stable on current therapy for over a year  Prior workup and clarification of diagnosis was attempted by [facility], per review of available records ) but incomplete . Given her age and normal sodium levels and current desmopressin therapy,  I am reluctant to change her medications or suspend them in order to clarify diagnosis.  Will defer to current endocrinology.

## 2014-06-12 NOTE — Assessment & Plan Note (Signed)
Well controlled on current regimen. Renal function stable, no changes today.  Lab Results  Component Value Date   CREATININE 0.8 06/09/2014   Lab Results  Component Value Date   NA 141 06/09/2014   K 4.2 06/09/2014   CL 106 06/09/2014   CO2 25 06/09/2014

## 2014-06-12 NOTE — Assessment & Plan Note (Addendum)
Home fasting sugars have been diagnostic of new onset DM Type 2.  However she has an entirely normal a1c. Today, no micralbuminuria or glucosuria,  Fasting glucose was a little low on serum test which may indicate some dysregulation and future DM but currently there is no indication for diagnosis of DM or for medications.  Low glycemic index diet discussed as a healthier lifestyle.   Lab Results  Component Value Date   HGBA1C 5.3 06/09/2014

## 2014-06-12 NOTE — Assessment & Plan Note (Signed)
With previously noted edema improved with suspension of amlodipine .  Can use compression stockings with caution given history of PAD

## 2014-06-14 DIAGNOSIS — H4011X2 Primary open-angle glaucoma, moderate stage: Secondary | ICD-10-CM | POA: Diagnosis not present

## 2014-06-14 DIAGNOSIS — H2513 Age-related nuclear cataract, bilateral: Secondary | ICD-10-CM | POA: Diagnosis not present

## 2014-07-05 ENCOUNTER — Other Ambulatory Visit: Payer: Self-pay | Admitting: *Deleted

## 2014-07-05 MED ORDER — GLUCOSE BLOOD VI STRP: ORAL_STRIP | Status: DC

## 2014-07-05 NOTE — Telephone Encounter (Signed)
Pt left message, requesting refills for One Touch test strips. Rx sent to pharmacy by escript

## 2014-07-07 DIAGNOSIS — M1990 Unspecified osteoarthritis, unspecified site: Secondary | ICD-10-CM | POA: Diagnosis not present

## 2014-07-07 DIAGNOSIS — D709 Neutropenia, unspecified: Secondary | ICD-10-CM | POA: Diagnosis not present

## 2014-07-07 DIAGNOSIS — D508 Other iron deficiency anemias: Secondary | ICD-10-CM | POA: Diagnosis not present

## 2014-07-07 DIAGNOSIS — R748 Abnormal levels of other serum enzymes: Secondary | ICD-10-CM | POA: Diagnosis not present

## 2014-07-12 ENCOUNTER — Telehealth: Payer: Self-pay | Admitting: Adult Health

## 2014-07-12 NOTE — Telephone Encounter (Signed)
Patient stated that [facility] will not except Insurance for test strips so patient will be obtaining them through [facility]. Patient stated they will call for script or fax.

## 2014-07-13 ENCOUNTER — Other Ambulatory Visit: Payer: Self-pay | Admitting: Internal Medicine

## 2014-07-14 ENCOUNTER — Ambulatory Visit (INDEPENDENT_AMBULATORY_CARE_PROVIDER_SITE_OTHER): Payer: Medicare Other | Admitting: Nurse Practitioner

## 2014-07-14 ENCOUNTER — Encounter: Payer: Self-pay | Admitting: Nurse Practitioner

## 2014-07-14 VITALS — BP 161/88 | HR 60 | Temp 98.6°F | Resp 14 | Ht 62.0 in | Wt 146.1 lb

## 2014-07-14 DIAGNOSIS — I739 Peripheral vascular disease, unspecified: Secondary | ICD-10-CM

## 2014-07-14 DIAGNOSIS — J029 Acute pharyngitis, unspecified: Secondary | ICD-10-CM

## 2014-07-14 LAB — POCT RAPID STREP A (OFFICE): Rapid Strep A Screen: NEGATIVE

## 2014-07-14 MED ORDER — BENZONATATE 100 MG PO CAPS: 100.0000 mg | ORAL_CAPSULE | Freq: Two times a day (BID) | ORAL | Status: DC | PRN

## 2014-07-14 NOTE — Progress Notes (Signed)
   Subjective:    Patient ID: Caroline Nichols, female    DOB: 01-17-1941, 73 y.o.   MRN: [id number]  HPI Caroline Nichols is a 73 yo female here with a CC of productive cough with 1 episode of pink tinged mucous.   1) Cough started today- yellowish sputum 1 episode of pink tinged mucous this morning. Congested, pressure behind eyes, laryngitis, sore throat, achy.  Tylenol- somewhat helpful Water, cofee once this am Swallowing painful but ate peaches, and 2 boiled egg whites, skinny bread this morning.  Took HTN medication at 0915 Hydralazine and lisinopril.  99 degree temperature this am and sweating. Believes she broke a fever from last night.   Review of Systems  Constitutional: Positive for fatigue. Negative for fever, chills and diaphoresis.  HENT: Positive for ear pain, sinus pressure, sore throat and voice change. Negative for trouble swallowing.        Right sharp pains  Respiratory: Positive for shortness of breath. Negative for chest tightness and wheezing.   Cardiovascular: Negative for chest pain, palpitations and leg swelling.  Gastrointestinal: Negative for nausea, vomiting and diarrhea.  Musculoskeletal: Positive for myalgias and neck pain. Negative for neck stiffness.       Neck pain from previous issue  Skin: Negative for rash.  Neurological: Positive for headaches. Negative for dizziness.       Objective:   Physical Exam  Constitutional: She is oriented to person, place, and time.  HENT:  Head: Normocephalic and atraumatic.  Mouth/Throat: Posterior oropharyngeal erythema present. No oropharyngeal exudate or posterior oropharyngeal edema.  Upper partials  Eyes: Conjunctivae and EOM are normal. Pupils are equal, round, and reactive to light. Right eye exhibits no discharge. Left eye exhibits no discharge. No scleral icterus.  Neck:  Tenderness to palpation of the anterior chain  Pulmonary/Chest: Effort normal and breath sounds normal. No respiratory distress. She has no  wheezes. She has no rales. She exhibits no tenderness.  Lymphadenopathy:    She has cervical adenopathy.  Neurological: She is alert and oriented to person, place, and time.  Skin: Skin is warm and dry.    BP 161/88 mmHg  Pulse 60  Temp(Src) 98.6 F (37 C)  Resp 14  Ht 5\' 2"  (1.575 m)  Wt 146 lb 1.9 oz (66.28 kg)  BMI 26.72 kg/m2  SpO2 99%     Assessment & Plan:  Rapid strep negative

## 2014-07-14 NOTE — Patient Instructions (Signed)

## 2014-07-14 NOTE — Progress Notes (Signed)
Pre visit review using our clinic review tool, if applicable. No additional management support is needed unless otherwise documented below in the visit note. 

## 2014-07-15 DIAGNOSIS — J029 Acute pharyngitis, unspecified: Secondary | ICD-10-CM | POA: Insufficient documentation

## 2014-07-15 NOTE — Assessment & Plan Note (Signed)
Pt had 3/4 Centor Criteria so a rapid strep test was performed. Result- Negative. Suspect viral pharyngitis. Tessalon Perles rx for cough and gave handout about Pharyngitis. Specifically discussed home care measures, when to seek care, and red flags. FU if worsening symptoms, failure to improve.

## 2014-07-18 ENCOUNTER — Emergency Department: Payer: Self-pay | Admitting: Emergency Medicine

## 2014-07-18 DIAGNOSIS — R42 Dizziness and giddiness: Secondary | ICD-10-CM | POA: Diagnosis not present

## 2014-07-18 DIAGNOSIS — R0602 Shortness of breath: Secondary | ICD-10-CM | POA: Diagnosis not present

## 2014-07-18 DIAGNOSIS — I10 Essential (primary) hypertension: Secondary | ICD-10-CM | POA: Diagnosis not present

## 2014-07-18 DIAGNOSIS — J449 Chronic obstructive pulmonary disease, unspecified: Secondary | ICD-10-CM | POA: Diagnosis not present

## 2014-07-18 DIAGNOSIS — J441 Chronic obstructive pulmonary disease with (acute) exacerbation: Secondary | ICD-10-CM | POA: Diagnosis not present

## 2014-07-18 DIAGNOSIS — Z87891 Personal history of nicotine dependence: Secondary | ICD-10-CM | POA: Diagnosis not present

## 2014-07-18 DIAGNOSIS — R197 Diarrhea, unspecified: Secondary | ICD-10-CM | POA: Diagnosis not present

## 2014-07-18 DIAGNOSIS — B349 Viral infection, unspecified: Secondary | ICD-10-CM | POA: Diagnosis not present

## 2014-07-18 LAB — TROPONIN I

## 2014-07-18 LAB — COMPREHENSIVE METABOLIC PANEL
ANION GAP: 10 (ref 7–16)
Albumin: 3.7 g/dL (ref 3.4–5.0)
Alkaline Phosphatase: 65 U/L
BUN: 10 mg/dL (ref 7–18)
Bilirubin,Total: 0.5 mg/dL (ref 0.2–1.0)
CALCIUM: 9.1 mg/dL (ref 8.5–10.1)
CO2: 24 mmol/L (ref 21–32)
Chloride: 101 mmol/L (ref 98–107)
Creatinine: 0.78 mg/dL (ref 0.60–1.30)
EGFR (African American): >60
Glucose: 82 mg/dL (ref 65–99)
OSMOLALITY: 268 (ref 275–301)
Potassium: 3.4 mmol/L — ABNORMAL LOW (ref 3.5–5.1)
SGOT(AST): 44 U/L — ABNORMAL HIGH (ref 15–37)
SGPT (ALT): 38 U/L
Sodium: 135 mmol/L — ABNORMAL LOW (ref 136–145)
Total Protein: 8.1 g/dL (ref 6.4–8.2)

## 2014-07-18 LAB — CBC
HCT: 35.4 % (ref 35.0–47.0)
HGB: 11.9 g/dL — ABNORMAL LOW (ref 12.0–16.0)
MCH: 30.1 pg (ref 26.0–34.0)
MCHC: 33.7 g/dL (ref 32.0–36.0)
MCV: 89 fL (ref 80–100)
Platelet: 117 10*3/uL — ABNORMAL LOW (ref 150–440)
RBC: 3.96 10*6/uL (ref 3.80–5.20)
RDW: 14.9 % — AB (ref 11.5–14.5)
WBC: 6.8 10*3/uL (ref 3.6–11.0)

## 2014-07-18 LAB — CK TOTAL AND CKMB (NOT AT ARMC)
CK, Total: 837 U/L — ABNORMAL HIGH (ref 26–192)
CK-MB: 7.8 ng/mL — ABNORMAL HIGH (ref 0.5–3.6)

## 2014-07-18 LAB — PRO B NATRIURETIC PEPTIDE: B-TYPE NATIURETIC PEPTID: 912 pg/mL — AB (ref 0–125)

## 2014-08-23 ENCOUNTER — Telehealth: Payer: Self-pay | Admitting: Adult Health

## 2014-08-23 ENCOUNTER — Encounter: Payer: Self-pay | Admitting: Endocrinology

## 2014-08-23 ENCOUNTER — Ambulatory Visit (INDEPENDENT_AMBULATORY_CARE_PROVIDER_SITE_OTHER): Payer: Medicare Other | Admitting: Endocrinology

## 2014-08-23 VITALS — BP 146/82 | HR 72 | Ht 62.0 in | Wt 142.5 lb

## 2014-08-23 DIAGNOSIS — E232 Diabetes insipidus: Secondary | ICD-10-CM

## 2014-08-23 DIAGNOSIS — E890 Postprocedural hypothyroidism: Secondary | ICD-10-CM | POA: Diagnosis not present

## 2014-08-23 LAB — T3, FREE: T3 FREE: 3.1 pg/mL (ref 2.3–4.2)

## 2014-08-23 LAB — BASIC METABOLIC PANEL
BUN: 17 mg/dL (ref 6–23)
CALCIUM: 9.2 mg/dL (ref 8.4–10.5)
CHLORIDE: 103 meq/L (ref 96–112)
CO2: 28 meq/L (ref 19–32)
Creatinine, Ser: 0.68 mg/dL (ref 0.40–1.20)
GFR: 109.04 mL/min (ref >60.00)
GLUCOSE: 88 mg/dL (ref 70–99)
Potassium: 3.6 mEq/L (ref 3.5–5.1)
Sodium: 135 mEq/L (ref 135–145)

## 2014-08-23 LAB — T4, FREE: Free T4: 1.4 ng/dL (ref 0.60–1.60)

## 2014-08-23 MED ORDER — HYDRALAZINE HCL 10 MG PO TABS: 10.0000 mg | ORAL_TABLET | Freq: Two times a day (BID) | ORAL | Status: DC

## 2014-08-23 NOTE — Progress Notes (Signed)
HPI  Caroline Nichols is a 74 y.o.-year-old female, returning for f/u for hypothyroidism. Last visit with Dr Cruzita Lederer May 2015.  Transferring care as can't travel to [place]. Also, Dr Derrel Nip referring her back for DI evaluation.  Hypothyroidism- Reviewed the hx: Pt. has been dx with hyperthyroidism and "inverted goiter" (?) in 1980s >> Dr Severe has sent her for RAI ablation >> postablative hypothyroidism >> started on Levothyroxine.  Since last visit in May 2015, she has been taking levothyroxine 112 mcg daily.  She had variability in the TFTs in the past.  Taking her levothyroxine correctly and not missed any doses recently.   Review of systems: [ x  ] complains of    [  ] denies [  ] weight gain  [  ] constipation  [x  ] fatigue-several months, feels that inflammatory arthritis contributing  [  ] dry skin  [ x ] cold intolerance [  ] hair changes [  ] noticing any enlargement in size of thyroid [  ] lumps in neck [  ] dysphagia [  ] change in voice. [  ] weight loss [  ] tremors [ x ] palpitations-fleeting after taking LT4 daily [  ] diarrhea [x  ] heat intolerance [  ] fatigue [ x] brittle nails  [ x] insomnia   Recd prednisone 1 month ago. Does not get joint injections.   I reviewed pt's thyroid tests: - no recent TFTs or recent changes in levothyroxine dose Lab Results  Component Value Date   TSH 0.600 08/23/2014   TSH 2.07 12/22/2013   TSH 1.00 08/18/2013   TSH 12.303* 12/31/2012   TSH 11.793* 12/27/2012   TSH 0.87 05/15/2009   FREET4 1.40 08/23/2014   FREET4 1.04 12/22/2013   FREET4 1.19 08/18/2013   FREET4 1.53 12/27/2012    Lab Results  Component Value Date   T3FREE 3.1 08/23/2014   T3FREE 2.0* 12/31/2012   Diabetes Insipidus-  Patient reports getting dxed with this about 3-4 years ago, when she was sick with inflammtory polyarthritis. She was diagnosed on some blood tests by Dr Gae Bon? In [place], [place]. Started on DDAVP 0.1 mg qhs and has been on this since then. At that  time, was symptomatic with polyuria. Now day time urinary frequency okay, but still getting up at night about 3-4 times.Drinking water about 16 0z x 3-4 through the day. Tries to limit water intake after 5 pm. Doesn't miss any DDAVP doses, so cant tell if there is any change in urinary frequency off it.  Dose of DDAVP unchanged recently.  Then she was seen by [facility] endocrine , but evaluation could not be completed ( Dr Rock Nephew). Etiology remains unclear.Their notes were reviewed. Pituitary hormones (PRL, IGF-1, cortisol were all normal in 2014). It is not clear to me at this point that the patient has DI.   Pt reports on and off Headaches since posterior  head injury ( jar of popcorn fell off the top shelf at [facility]) ~2013.  No change in peripheral vision Gone up half size on shoes since then, no change in ring size Menopause 38 years ago when she had partial hysterectomy. No breast discharge No interfering medications or CKD  Reviewed recent labs  Lab Results  Component Value Date   NA 135 08/23/2014   NA 141 06/09/2014   NA 138 11/11/2013   NA 135 03/18/2013   NA 135 01/02/2013   Lab Results  Component Value Date   GFR  109.04 08/23/2014   GFR 89.16 06/09/2014   GFR 133.96 11/11/2013   CREATININE 0.68 08/23/2014   CREATININE 0.8 06/09/2014   CREATININE 0.6 11/11/2013   Lab Results  Component Value Date   LABOSMO 285 08/23/2014   LABOSMO 254* 12/29/2012   Lab Results  Component Value Date   OSMOLALUR 506 08/23/2014   OSMOLALUR 116* 12/29/2012   Lab Results  Component Value Date   HGBA1C 5.3 06/09/2014   HGBA1C 5.2 12/27/2012     PE: BP 146/82 mmHg  Pulse 72  Ht 5\' 2"  (1.575 m)  Wt 142 lb 8 oz (64.638 kg)  BMI 26.06 kg/m2  SpO2 96% Wt Readings from Last 3 Encounters:  08/23/14 142 lb 8 oz (64.638 kg)  07/14/14 146 lb 1.9 oz (66.28 kg)  06/09/14 149 lb 12 oz (67.926 kg)   HEENT: [place]/AT, EOMI, no icterus, no proptosis, no chemosis, no mild lid lag, no retraction,  eyes close completely, gross normal VF testing on confrontation Neck: thyroid gland - smooth, non-tender, no erythema, no tracheal deviation; negative Pemberton's sign; no lymphadenopathy; no bruits Lungs: good air entry, clear bilaterally Heart: S1&S2 normal, regular rate & rhythm; +Systolic murmurs, rubs or gallops Abd: soft, NT, ND, no HSM, +BS Ext: occ tremor in hands bilaterally, no edema, 2+ DP/PT pulses, good muscle mass Neuro: normal gait, 2+ reflexes bilaterally, normal 5/5 strength, no proximal myopathy  Derm: no pretibial myxoedema/skin dryness  ASSESSMENT: 1. Hypothyroidism - post RAI tx for Hyperthyroidism (likely Graves ds) 2. ? DI on DDAVP therapy and stable  PLAN:  Problem List Items Addressed This Visit      Endocrine   Postablative hypothyroidism - Primary    She appears clinically euthyroid today without evidence of goiter/nodules.  -Will check complete thyroid panel today to assess current dose of Levothyroxine. Adjust based on labs from today.        Relevant Orders   T4, free (Completed)   T3, free (Completed)   Thyroid Profile II (Completed)   Diabetes insipidus    She does give a history of polyuria about 3-4 years ago, following which she was started on DDAVP. DDAVP seems to be controlling her symptoms atleast during daytime.  Dose of DDAVP has been stable recently. Labs in 12/2012 did show low serum osm and low urine osm, while on DDAVP.   We discussed about various causes of DI , central versus nephrogenic. She would like to proceed with further evaluation of this problem. At this time, it is not clear to me that she has DI.   -Will test baseline labs today for BMP, serum and urine Osm.  -obtain records from ? [facility] in [place], [place]. - reviewed [facility] records - If labs are normal and she is showing a urine concentrating effect while on DDAVP, then with caution we can plan to hold DDAVP for 2-3 days, following which the workup can be repeated for DI. If  she indeed has DI, then would be important to differentiate central from nephrogenic causes. Patient agreeable for this plan. For now, she will continue DDAVP at current doses.         Relevant Orders   Basic metabolic panel (Completed)   Osmolality (Completed)   Osmolality, urine (Completed)       Office Visit on 08/23/2014  Component Date Value Ref Range Status  . Free T4 08/23/2014 1.40  0.60 - 1.60 ng/dL Final  . T3, Free 08/23/2014 3.1  2.3 - 4.2 pg/mL Final  . TSH  08/23/2014 0.600  0.450 - 4.500 uIU/mL Final  . T4, Total 08/23/2014 10.0  4.5 - 12.0 ug/dL Final  . T3 Uptake Ratio 08/23/2014 33  24 - 39 % Final  . Free Thyroxine Index 08/23/2014 3.3  1.2 - 4.9 Final  . T3, Total 08/23/2014 101  71 - 180 ng/dL Final  . Sodium 08/23/2014 135  135 - 145 mEq/L Final  . Potassium 08/23/2014 3.6  3.5 - 5.1 mEq/L Final  . Chloride 08/23/2014 103  96 - 112 mEq/L Final  . CO2 08/23/2014 28  19 - 32 mEq/L Final  . Glucose, Bld 08/23/2014 88  70 - 99 mg/dL Final  . BUN 08/23/2014 17  6 - 23 mg/dL Final  . Creatinine, Ser 08/23/2014 0.68  0.40 - 1.20 mg/dL Final  . Calcium 08/23/2014 9.2  8.4 - 10.5 mg/dL Final  . GFR 08/23/2014 109.04  >60.00 mL/min Final  . Osmolality 08/23/2014 285  275 - 300 mOsm/kg Final  . Osmolality, Ur 08/23/2014 506  390 - 1090 mOsm/kg Final   Liliyana Thobe PUSHKAR 08/24/2014 2:28 PM

## 2014-08-23 NOTE — Patient Instructions (Signed)
Labs today. Continue current levothyroxine and DDAVP.   Please come back for a follow-up appointment in 2 weeks

## 2014-08-23 NOTE — Telephone Encounter (Signed)
Rx sent to pharmacy by escript  

## 2014-08-23 NOTE — Progress Notes (Signed)
Pre visit review using our clinic review tool, if applicable. No additional management support is needed unless otherwise documented below in the visit note. 

## 2014-08-23 NOTE — Telephone Encounter (Signed)
Pt needs refill on hydralazine 10mg . Pt has change pharmacies to [facility] on [address]

## 2014-08-24 LAB — OSMOLALITY: OSMOLALITY: 285 mosm/kg (ref 275–300)

## 2014-08-24 LAB — THYROID PROFILE II
FREE THYROXINE INDEX: 3.3 (ref 1.2–4.9)
T3 Uptake Ratio: 33 % (ref 24–39)
T3, Total: 101 ng/dL (ref 71–180)
T4 TOTAL: 10 ug/dL (ref 4.5–12.0)
TSH: 0.6 u[IU]/mL (ref 0.450–4.500)

## 2014-08-24 LAB — OSMOLALITY, URINE: Osmolality, Ur: 506 mOsm/kg (ref 390–1090)

## 2014-08-24 NOTE — Assessment & Plan Note (Signed)
She does give a history of polyuria about 3-4 years ago, following which she was started on DDAVP. DDAVP seems to be controlling her symptoms atleast during daytime.  Dose of DDAVP has been stable recently. Labs in 12/2012 did show low serum osm and low urine osm, while on DDAVP.   We discussed about various causes of DI , central versus nephrogenic. She would like to proceed with further evaluation of this problem. At this time, it is not clear to me that she has DI.   -Will test baseline labs today for BMP, serum and urine Osm.  -obtain records from ? [facility] in [place], [place]. - reviewed [facility] records - If labs are normal and she is showing a urine concentrating effect while on DDAVP, then with caution we can plan to hold DDAVP for 2-3 days, following which the workup can be repeated for DI. If she indeed has DI, then would be important to differentiate central from nephrogenic causes. Patient agreeable for this plan. For now, she will continue DDAVP at current doses.

## 2014-08-24 NOTE — Assessment & Plan Note (Signed)
She appears clinically euthyroid today without evidence of goiter/nodules.  -Will check complete thyroid panel today to assess current dose of Levothyroxine. Adjust based on labs from today.

## 2014-09-13 ENCOUNTER — Encounter: Payer: Self-pay | Admitting: Endocrinology

## 2014-09-13 ENCOUNTER — Ambulatory Visit (INDEPENDENT_AMBULATORY_CARE_PROVIDER_SITE_OTHER): Payer: Medicare Other | Admitting: Endocrinology

## 2014-09-13 VITALS — BP 140/86 | HR 74 | Resp 14 | Ht 62.0 in | Wt 142.0 lb

## 2014-09-13 DIAGNOSIS — E232 Diabetes insipidus: Secondary | ICD-10-CM | POA: Diagnosis not present

## 2014-09-13 DIAGNOSIS — E890 Postprocedural hypothyroidism: Secondary | ICD-10-CM

## 2014-09-13 LAB — BASIC METABOLIC PANEL
BUN: 20 mg/dL (ref 6–23)
CHLORIDE: 106 meq/L (ref 96–112)
CO2: 24 mEq/L (ref 19–32)
Calcium: 9.4 mg/dL (ref 8.4–10.5)
Creatinine, Ser: 0.7 mg/dL (ref 0.40–1.20)
GFR: 105.44 mL/min (ref >60.00)
GLUCOSE: 69 mg/dL — AB (ref 70–99)
Potassium: 3.9 mEq/L (ref 3.5–5.1)
SODIUM: 140 meq/L (ref 135–145)

## 2014-09-13 NOTE — Patient Instructions (Signed)
Continue current DDAVP tonight.  Tomorrow night, please hold the DDAVP and observe your urine output and thirst. If you find that the urine output has picked up unusually- then go ahead and take the DDAVP, even if you have to do so in the middle of the night.  Please return the following morning for labs around 8 am.   Please come back for a follow-up appointment in 1 weeks

## 2014-09-13 NOTE — Assessment & Plan Note (Addendum)
She appears clinically euthyroid today without evidence of goiter/nodules.  Recent labs are at goal on current levothyroxine therapy. Continue current dose. Hypothyroidism is controlled and not a cause of sodium imbalance/recent Na normal.

## 2014-09-13 NOTE — Progress Notes (Signed)
HPI  Caroline Nichols is a 74 y.o.-year-old female, returning for f/u for hypothyroidism and DI. Last visit with  Me 3 weeks ago.   Hypothyroidism- Reviewed the hx: Pt. has been dx with hyperthyroidism and "inverted goiter" (?) in 1980s >> Dr Severe has sent her for RAI ablation >> postablative hypothyroidism >> started on Levothyroxine.  Since May 2015, she has been taking levothyroxine 112 mcg daily.  She had variability in the TFTs in the past.  Taking her levothyroxine correctly and not missed any doses recently.   Review of systems: [ x  ] complains of    [  ] denies [  ] weight gain  [  ] constipation  [x  ] fatigue-several months, feels that inflammatory arthritis contributing  [  ] dry skin  [ x ] cold intolerance [  ] hair changes [  ] noticing any enlargement in size of thyroid [  ] lumps in neck [  ] dysphagia [  ] change in voice. [  ] weight loss [  ] tremors [ x ] palpitations-fleeting after taking LT4 daily [  ] diarrhea [x  ] heat intolerance [  ] fatigue [ x] brittle nails  [ x] insomnia   Recd prednisone 1 month ago. Does not get joint injections.   I reviewed pt's thyroid tests: - no recent TFTs or recent changes in levothyroxine dose Lab Results  Component Value Date   TSH 0.600 08/23/2014   TSH 2.07 12/22/2013   TSH 1.00 08/18/2013   TSH 12.303* 12/31/2012   TSH 11.793* 12/27/2012   TSH 0.87 05/15/2009   FREET4 1.40 08/23/2014   FREET4 1.04 12/22/2013   FREET4 1.19 08/18/2013   FREET4 1.53 12/27/2012    Lab Results  Component Value Date   T3FREE 3.1 08/23/2014   T3FREE 2.0* 12/31/2012   Diabetes Insipidus-  Patient reports getting dxed with this about 3-4 years ago, when she was sick with inflammtory polyarthritis. She was diagnosed with partial DI based on hx of polyuria and increased thirst ( Na was normal) by Dr Dorris Fetch In [place], [place]. Started on DDAVP 0.1 mg qhs and has been on this since then. Polyuria was initially controlled somewhat, now she she  reports that she is going about 15 times during the day, out of which about 5-6 times are at night. Feels exhausted in the morning due to lack of sleep. Fells that she has dry mouth and drinks about 64- 70 oz water daily. No recent change in meds.  Tries to limit water intake after 5 pm. Doesn't miss any DDAVP doses, so cant tell if there is any change in urinary frequency off it.  Dose of DDAVP unchanged recently.  Then she was seen by  [facility] endocrine , but evaluation could not be completed ( Dr Rock Nephew). Etiology remains unclear.Their notes were reviewed. Pituitary hormones (PRL, IGF-1, cortisol were all normal in 2014). It is not clear to me at this point that the patient has DI. Reports that she was seen at [facility] as well for having a water deprivation test, however was told there that she didn't need the DDAVP a while back.   Pt reports on and off Headaches since posterior  head injury ( jar of popcorn fell off the top shelf at [facility]) ~2013.  No change in peripheral vision Gone up half size on shoes since then, no change in ring size Menopause 38 years ago when she had partial hysterectomy. No breast discharge No interfering  medications or CKD  Reviewed recent labs  Lab Results  Component Value Date   NA 135 08/23/2014   NA 141 06/09/2014   NA 138 11/11/2013   NA 135 03/18/2013   NA 135 01/02/2013   Lab Results  Component Value Date   GFR 109.04 08/23/2014   GFR 89.16 06/09/2014   GFR 133.96 11/11/2013   CREATININE 0.68 08/23/2014   CREATININE 0.8 06/09/2014   CREATININE 0.6 11/11/2013   Lab Results  Component Value Date   LABOSMO 285 08/23/2014   LABOSMO 254* 12/29/2012   Lab Results  Component Value Date   OSMOLALUR 506 08/23/2014   OSMOLALUR 116* 12/29/2012   Lab Results  Component Value Date   HGBA1C 5.3 06/09/2014   HGBA1C 5.2 12/27/2012   Lab Results  Component Value Date   CALCIUM 9.2 08/23/2014     PE: BP 140/86 mmHg  Pulse 74  Resp 14  Ht 5\' 2"   (1.575 m)  Wt 142 lb (64.411 kg)  BMI 25.97 kg/m2  SpO2 97% Wt Readings from Last 3 Encounters:  09/13/14 142 lb (64.411 kg)  08/23/14 142 lb 8 oz (64.638 kg)  07/14/14 146 lb 1.9 oz (66.28 kg)   HEENT: [place]/AT, EOMI, no icterus, no proptosis, no chemosis, no mild lid lag, no retraction, eyes close completely, gross normal VF testing on confrontation Neck: thyroid gland - smooth, non-tender, no erythema, no tracheal deviation; negative Pemberton's sign; no lymphadenopathy; no bruits Lungs: good air entry, clear bilaterally Heart: S1&S2 normal, regular rate & rhythm; +Systolic murmurs, rubs or gallops Ext: occ tremor in hands bilaterally, no edema, 2+ DP/PT pulses, good muscle mass  ASSESSMENT: 1. Hypothyroidism - post RAI tx for Hyperthyroidism (likely Graves ds) 2. ? DI on DDAVP therapy and stable  PLAN:  Problem List Items Addressed This Visit      Endocrine   Postablative hypothyroidism    She appears clinically euthyroid today without evidence of goiter/nodules.  Recent labs are at goal on current levothyroxine therapy. Continue current dose. Hypothyroidism is controlled and not a cause of sodium imbalance/recent Na normal.        Diabetes insipidus - Primary    She does give a history of polyuria about 3-4 years ago, following which she was started on DDAVP. DDAVP seemed to be controlling her symptoms atleast during daytime. Not anymore? Dose of DDAVP has been stable recently. Labs in 12/2012 did show low serum osm and low urine osm, while on DDAVP. Most recent labs show low normal Na with urinary concentration on osm.  We discussed about various causes of DI , central versus nephrogenic. She would like to proceed with further evaluation of this problem. At this time, it is not clear to me that she has DI.   -test BMP today.  - Will ask her to hold the DDAVP tomorrow night and return following morning for the labs. She was advised to keep track of her urinary volume, frequency  and thirst. If there is an unusual increase in urinary frequency after holding the DDAVP- then she was asked to take the medication even if she has to in the middle of the night.   RTC 1 week          Relevant Orders   Basic metabolic panel   Osmolality   Osmolality, urine   Basic metabolic panel       No visits with results within 1 Day(s) from this visit. Latest known visit with results is:  Office Visit  on 08/23/2014  Component Date Value Ref Range Status  . Free T4 08/23/2014 1.40  0.60 - 1.60 ng/dL Final  . T3, Free 08/23/2014 3.1  2.3 - 4.2 pg/mL Final  . TSH 08/23/2014 0.600  0.450 - 4.500 uIU/mL Final  . T4, Total 08/23/2014 10.0  4.5 - 12.0 ug/dL Final  . T3 Uptake Ratio 08/23/2014 33  24 - 39 % Final  . Free Thyroxine Index 08/23/2014 3.3  1.2 - 4.9 Final  . T3, Total 08/23/2014 101  71 - 180 ng/dL Final  . Sodium 08/23/2014 135  135 - 145 mEq/L Final  . Potassium 08/23/2014 3.6  3.5 - 5.1 mEq/L Final  . Chloride 08/23/2014 103  96 - 112 mEq/L Final  . CO2 08/23/2014 28  19 - 32 mEq/L Final  . Glucose, Bld 08/23/2014 88  70 - 99 mg/dL Final  . BUN 08/23/2014 17  6 - 23 mg/dL Final  . Creatinine, Ser 08/23/2014 0.68  0.40 - 1.20 mg/dL Final  . Calcium 08/23/2014 9.2  8.4 - 10.5 mg/dL Final  . GFR 08/23/2014 109.04  >60.00 mL/min Final  . Osmolality 08/23/2014 285  275 - 300 mOsm/kg Final  . Osmolality, Ur 08/23/2014 506  390 - 1090 mOsm/kg Final   Follow back in 1 week Alia Parsley PUSHKAR 09/13/2014 11:50 AM

## 2014-09-13 NOTE — Assessment & Plan Note (Signed)
She does give a history of polyuria about 3-4 years ago, following which she was started on DDAVP. DDAVP seemed to be controlling her symptoms atleast during daytime. Not anymore? Dose of DDAVP has been stable recently. Labs in 12/2012 did show low serum osm and low urine osm, while on DDAVP. Most recent labs show low normal Na with urinary concentration on osm.  We discussed about various causes of DI , central versus nephrogenic. She would like to proceed with further evaluation of this problem. At this time, it is not clear to me that she has DI.   -test BMP today.  - Will ask her to hold the DDAVP tomorrow night and return following morning for the labs. She was advised to keep track of her urinary volume, frequency and thirst. If there is an unusual increase in urinary frequency after holding the DDAVP- then she was asked to take the medication even if she has to in the middle of the night.   RTC 1 week

## 2014-09-13 NOTE — Progress Notes (Signed)
Pre visit review using our clinic review tool, if applicable. No additional management support is needed unless otherwise documented below in the visit note. 

## 2014-09-14 DIAGNOSIS — H4011X2 Primary open-angle glaucoma, moderate stage: Secondary | ICD-10-CM | POA: Diagnosis not present

## 2014-09-14 DIAGNOSIS — H2513 Age-related nuclear cataract, bilateral: Secondary | ICD-10-CM | POA: Diagnosis not present

## 2014-09-15 ENCOUNTER — Other Ambulatory Visit (INDEPENDENT_AMBULATORY_CARE_PROVIDER_SITE_OTHER): Payer: Medicare Other

## 2014-09-15 DIAGNOSIS — E232 Diabetes insipidus: Secondary | ICD-10-CM

## 2014-09-15 LAB — BASIC METABOLIC PANEL
BUN: 17 mg/dL (ref 6–23)
CALCIUM: 9.6 mg/dL (ref 8.4–10.5)
CO2: 27 mEq/L (ref 19–32)
Chloride: 107 mEq/L (ref 96–112)
Creatinine, Ser: 0.87 mg/dL (ref 0.40–1.20)
GFR: 82.04 mL/min (ref >60.00)
Glucose, Bld: 104 mg/dL — ABNORMAL HIGH (ref 70–99)
Potassium: 3.8 mEq/L (ref 3.5–5.1)
Sodium: 141 mEq/L (ref 135–145)

## 2014-09-16 LAB — OSMOLALITY: OSMOLALITY: 299 mosm/kg (ref 275–300)

## 2014-09-16 LAB — OSMOLALITY, URINE: Osmolality, Ur: 442 mOsm/kg (ref 390–1090)

## 2014-09-20 ENCOUNTER — Ambulatory Visit (INDEPENDENT_AMBULATORY_CARE_PROVIDER_SITE_OTHER): Payer: Medicare Other | Admitting: Endocrinology

## 2014-09-20 ENCOUNTER — Encounter: Payer: Self-pay | Admitting: Endocrinology

## 2014-09-20 ENCOUNTER — Other Ambulatory Visit: Payer: Self-pay | Admitting: Endocrinology

## 2014-09-20 VITALS — BP 142/80 | HR 75 | Resp 14 | Ht 62.0 in | Wt 138.5 lb

## 2014-09-20 DIAGNOSIS — E232 Diabetes insipidus: Secondary | ICD-10-CM

## 2014-09-20 LAB — BASIC METABOLIC PANEL
BUN: 21 mg/dL (ref 6–23)
CHLORIDE: 106 meq/L (ref 96–112)
CO2: 30 mEq/L (ref 19–32)
CREATININE: 0.85 mg/dL (ref 0.40–1.20)
Calcium: 9.8 mg/dL (ref 8.4–10.5)
GFR: 84.27 mL/min (ref >60.00)
Glucose, Bld: 79 mg/dL (ref 70–99)
Potassium: 4 mEq/L (ref 3.5–5.1)
Sodium: 140 mEq/L (ref 135–145)

## 2014-09-20 NOTE — Patient Instructions (Addendum)
Monitor for symptoms of excess urinary frequency, unusual thirst.  Can continue to hold DDAVP for longer number of days now.  Please hold for 5 days  And return for labs this Friday.   If labs are fine this Friday, then we can continue to stay off the med, DDAVP,  for 2 weeks and return for labs then.   Please come back for a follow-up appointment in 1 month. Can make change to morning time Hydralazine, rather than taking it at night in another 2 weeks

## 2014-09-20 NOTE — Progress Notes (Signed)
Pre visit review using our clinic review tool, if applicable. No additional management support is needed unless otherwise documented below in the visit note. 

## 2014-09-20 NOTE — Progress Notes (Signed)
HPI  Caroline Nichols is a 74 y.o.-year-old female, returning for f/u for  DI. Last visit with  me 1 weeks ago.   Diabetes Insipidus-  Patient reports getting dxed with this about 3-4 years ago, when she was sick with inflammtory polyarthritis. She was diagnosed with partial DI based on hx of polyuria and increased thirst ( Na was normal) by Dr Dorris Fetch In [place], [place]. Started on DDAVP 0.1 mg qhs and has been on this since then. Polyuria was initially controlled somewhat, now she she reports that she is going about 15 times during the day, out of which about 5-6 times are at night. Feels exhausted in the morning due to lack of sleep. Fells that she has dry mouth and drinks about 64- 70 oz water daily. No recent change in meds.  Tries to limit water intake after 5-7 pm. Doesn't miss any DDAVP doses, so cant tell if there is any change in urinary frequency off it.  Dose of DDAVP unchanged recently.  Then she was seen by [facility] endocrine , but evaluation could not be completed ( Dr Rock Nephew). Etiology remains unclear.Their notes were reviewed. Pituitary hormones (PRL, IGF-1, cortisol were all normal in 2014). It is not clear to me at this point that the patient has DI. Reports that she was seen at [facility] as well for having a water deprivation test, however was told there that she didn't need the DDAVP a while back.   Interim HPI-  Pt reports on and off Headaches since posterior  head injury ( jar of popcorn fell off the top shelf at [facility]) ~2013. Wonders if HAs could be related to her night time admin of Hydralazine.  No change in peripheral vision Gone up half size on shoes since 2013, no change in ring size Menopause 38 years ago when she had partial hysterectomy. No breast discharge No interfering medications or CKD  Has held DDAVP Sunday and Monday night and doesn't note any difference in the way she feels. No mental changes, unusual change in her urinary frequency or thirst.   Reviewed recent  labs  Lab Results  Component Value Date   NA 141 09/15/2014   NA 140 09/13/2014   NA 135 08/23/2014   NA 141 06/09/2014   NA 138 11/11/2013   Lab Results  Component Value Date   GFR 82.04 09/15/2014   GFR 105.44 09/13/2014   GFR 109.04 08/23/2014   CREATININE 0.87 09/15/2014   CREATININE 0.70 09/13/2014   CREATININE 0.68 08/23/2014   Lab Results  Component Value Date   LABOSMO 299 09/15/2014   LABOSMO 285 08/23/2014   LABOSMO 254* 12/29/2012   Lab Results  Component Value Date   OSMOLALUR 442 09/15/2014   OSMOLALUR 506 08/23/2014   OSMOLALUR 116* 12/29/2012   Lab Results  Component Value Date   HGBA1C 5.3 06/09/2014   HGBA1C 5.2 12/27/2012   Lab Results  Component Value Date   CALCIUM 9.6 09/15/2014     PE: BP 142/80 mmHg  Pulse 75  Resp 14  Ht 5\' 2"  (1.575 m)  Wt 138 lb 8 oz (62.823 kg)  BMI 25.33 kg/m2  SpO2 97% Wt Readings from Last 3 Encounters:  09/20/14 138 lb 8 oz (62.823 kg)  09/13/14 142 lb (64.411 kg)  08/23/14 142 lb 8 oz (64.638 kg)   HEENT: [place]/AT, EOMI, no icterus, no proptosis, no chemosis, no mild lid lag, no retraction, eyes close completely, gross normal VF testing on confrontation ( prior visit)  Neck: thyroid gland - smooth, non-tender, no erythema, no tracheal deviation; negative Pemberton's sign; no lymphadenopathy; no bruits Lungs: good air entry, clear bilaterally Heart: S1&S2 normal, regular rate & rhythm; +Systolic murmurs, rubs or gallops Ext: occ tremor in hands bilaterally, no edema, 2+ DP/PT pulses, good muscle mass  ASSESSMENT: 1. ? DI on DDAVP therapy and stable  PLAN:  Problem List Items Addressed This Visit      Endocrine   Diabetes insipidus - Primary    She does give a history of polyuria about 3-4 years ago, following which she was started on DDAVP. DDAVP seemed to be controlling her symptoms atleast during daytime. Not anymore? Dose of DDAVP has been stable recently. Most recent labs show low normal Na with  urinary concentration on osm.  At this time, it is not clear to me that she has DI.   -test BMP today and osm today.  -she has ben able to hold the DDAVP successfully for the past 48 hours and not noticed any change in her status. Asked her to continue holding the tablet till Friday and return for labs then.  - Based on these next 2 sets of labs- she may be able to hold the DDAVP even longer for the next time and then eventually come off the medication,if her sodium level continues to stay in normal range. -She was advised to keep track of her urinary volume, frequency and thirst. If there is an unusual increase in urinary frequency after holding the DDAVP- then she was asked to take the medication even if she has to in the middle of the night. She will notify me if she does take the med.   RTC 1 month. Following this trial off the DDAVP, she could try taking her hydralazine in the morning to see if she feels better with respect to her HAs.            Relevant Orders   Basic metabolic panel   Osmolality   Osmolality, urine       No visits with results within 1 Day(s) from this visit. Latest known visit with results is:  Lab on 09/15/2014  Component Date Value Ref Range Status  . Sodium 09/15/2014 141  135 - 145 mEq/L Final  . Potassium 09/15/2014 3.8  3.5 - 5.1 mEq/L Final  . Chloride 09/15/2014 107  96 - 112 mEq/L Final  . CO2 09/15/2014 27  19 - 32 mEq/L Final  . Glucose, Bld 09/15/2014 104* 70 - 99 mg/dL Final  . BUN 09/15/2014 17  6 - 23 mg/dL Final  . Creatinine, Ser 09/15/2014 0.87  0.40 - 1.20 mg/dL Final  . Calcium 09/15/2014 9.6  8.4 - 10.5 mg/dL Final  . GFR 09/15/2014 82.04  >60.00 mL/min Final  . Osmolality 09/15/2014 299  275 - 300 mOsm/kg Final  . Osmolality, Ur 09/15/2014 442  390 - 1090 mOsm/kg Final   Follow back in 1 month  Tennyson Wacha PUSHKAR 09/20/2014 11:51 AM

## 2014-09-20 NOTE — Assessment & Plan Note (Signed)
She does give a history of polyuria about 3-4 years ago, following which she was started on DDAVP. DDAVP seemed to be controlling her symptoms atleast during daytime. Not anymore? Dose of DDAVP has been stable recently. Most recent labs show low normal Na with urinary concentration on osm.  At this time, it is not clear to me that she has DI.   -test BMP today and osm today.  -she has ben able to hold the DDAVP successfully for the past 48 hours and not noticed any change in her status. Asked her to continue holding the tablet till Friday and return for labs then.  - Based on these next 2 sets of labs- she may be able to hold the DDAVP even longer for the next time and then eventually come off the medication,if her sodium level continues to stay in normal range. -She was advised to keep track of her urinary volume, frequency and thirst. If there is an unusual increase in urinary frequency after holding the DDAVP- then she was asked to take the medication even if she has to in the middle of the night. She will notify me if she does take the med.   RTC 1 month. Following this trial off the DDAVP, she could try taking her hydralazine in the morning to see if she feels better with respect to her HAs.

## 2014-09-21 ENCOUNTER — Other Ambulatory Visit (INDEPENDENT_AMBULATORY_CARE_PROVIDER_SITE_OTHER): Payer: Medicare Other

## 2014-09-21 DIAGNOSIS — E232 Diabetes insipidus: Secondary | ICD-10-CM | POA: Diagnosis not present

## 2014-09-21 LAB — BASIC METABOLIC PANEL
BUN: 20 mg/dL (ref 6–23)
CHLORIDE: 108 meq/L (ref 96–112)
CO2: 25 meq/L (ref 19–32)
Calcium: 9.9 mg/dL (ref 8.4–10.5)
Creatinine, Ser: 0.81 mg/dL (ref 0.40–1.20)
GFR: 89.09 mL/min (ref >60.00)
Glucose, Bld: 94 mg/dL (ref 70–99)
Potassium: 4.1 mEq/L (ref 3.5–5.1)
SODIUM: 142 meq/L (ref 135–145)

## 2014-09-21 LAB — OSMOLALITY, URINE: OSMOLALITY UR: 165 mosm/kg — AB (ref 390–1090)

## 2014-09-21 LAB — OSMOLALITY: Osmolality: 294 mOsm/kg (ref 275–300)

## 2014-09-22 ENCOUNTER — Telehealth: Payer: Self-pay

## 2014-09-22 LAB — OSMOLALITY: Osmolality: 300 mOsm/kg (ref 275–300)

## 2014-09-22 LAB — OSMOLALITY, URINE: Osmolality, Ur: 142 mOsm/kg — ABNORMAL LOW (ref 390–1090)

## 2014-09-22 NOTE — Telephone Encounter (Signed)
Patient called wanting to know what to do with her medication tonight. Per Dr. Howell Rucks continue taking DDAVP as ordered. Patient verbalized understanding. BP not too high its just higher than the patients usually 148/88.

## 2014-09-23 ENCOUNTER — Other Ambulatory Visit: Payer: Medicare Other

## 2014-09-30 ENCOUNTER — Other Ambulatory Visit: Payer: Self-pay | Admitting: *Deleted

## 2014-09-30 MED ORDER — PANTOPRAZOLE SODIUM 40 MG PO TBEC: DELAYED_RELEASE_TABLET | ORAL | Status: DC

## 2014-10-05 ENCOUNTER — Telehealth: Payer: Self-pay

## 2014-10-05 NOTE — Telephone Encounter (Signed)
Per Dr. Howell Rucks: Please could you let her know that we have arranged for the modified water deprivation test to be done here. We can do this next Tuesday.  It is atleast a 5 hour test, so she should plan accordingly. She has to be monitored during this time, but could wait in the waiting room with the TVs/magazines.  Please ask her to hold the DDAVP the night before, come back the following morning after avoiding water intake since 4 am.  Idea of this test is to not let her drink any fluids in these 5 hours and see whether she gets little dehydrated or starts having problems with lot of thirst or excess urination still. We will be monitoring her during this time. Towards end of test, will give her medication DDAVP, but through a nasal spray as it is fast acting and seeing its effect. Then its end of test around 1pm. Sooner if she doesn't tolerate the water deprivation  Spoke to patient to notify her of Dr. Boyd Kerbs comments. Patient verbalized understanding. Nurse visit scheduled 10/11/14 at 8:45am

## 2014-10-06 ENCOUNTER — Other Ambulatory Visit: Payer: Self-pay | Admitting: Endocrinology

## 2014-10-06 MED ORDER — DESMOPRESSIN ACE REFRIGERATED 0.01 % NA SOLN: 1.0000 [drp] | Freq: Every day | NASAL | Status: DC

## 2014-10-07 ENCOUNTER — Telehealth: Payer: Self-pay | Admitting: *Deleted

## 2014-10-07 MED ORDER — DESMOPRESSIN ACETATE 1.5 MG/ML NA SOLN: 1.0000 | Freq: Every day | NASAL | Status: DC

## 2014-10-10 ENCOUNTER — Other Ambulatory Visit: Payer: Self-pay | Admitting: Endocrinology

## 2014-10-10 ENCOUNTER — Telehealth: Payer: Self-pay

## 2014-10-10 DIAGNOSIS — E232 Diabetes insipidus: Secondary | ICD-10-CM

## 2014-10-10 NOTE — Telephone Encounter (Signed)
Called patient earlier (2pm) and left a voicemail requesting a callback to confirm patient picked up medication from pharmacy. Patient has not returned call. Called patient a second time today (4:15pm) and still no answer left second voicemail with same comments. Awaiting a return call at this time.

## 2014-10-11 ENCOUNTER — Other Ambulatory Visit: Payer: Self-pay | Admitting: Endocrinology

## 2014-10-11 ENCOUNTER — Ambulatory Visit (INDEPENDENT_AMBULATORY_CARE_PROVIDER_SITE_OTHER): Payer: Medicare Other | Admitting: *Deleted

## 2014-10-11 ENCOUNTER — Other Ambulatory Visit: Payer: Self-pay | Admitting: *Deleted

## 2014-10-11 DIAGNOSIS — E232 Diabetes insipidus: Secondary | ICD-10-CM

## 2014-10-11 LAB — BASIC METABOLIC PANEL
BUN: 20 mg/dL (ref 6–23)
BUN: 21 mg/dL (ref 6–23)
BUN: 22 mg/dL (ref 6–23)
CALCIUM: 10.2 mg/dL (ref 8.4–10.5)
CO2: 31 meq/L (ref 19–32)
CO2: 30 mEq/L (ref 19–32)
CO2: 31 meq/L (ref 19–32)
CREATININE: 0.88 mg/dL (ref 0.40–1.20)
Calcium: 9.9 mg/dL (ref 8.4–10.5)
Calcium: 10.4 mg/dL (ref 8.4–10.5)
Chloride: 106 mEq/L (ref 96–112)
Chloride: 107 mEq/L (ref 96–112)
Chloride: 106 mEq/L (ref 96–112)
Creatinine, Ser: 0.88 mg/dL (ref 0.40–1.20)
Creatinine, Ser: 0.9 mg/dL (ref 0.40–1.20)
GFR: 80.95 mL/min (ref >60.00)
GFR: 78.88 mL/min (ref >60.00)
GFR: 80.95 mL/min (ref >60.00)
GLUCOSE: 128 mg/dL — AB (ref 70–99)
Glucose, Bld: 69 mg/dL — ABNORMAL LOW (ref 70–99)
Glucose, Bld: 84 mg/dL (ref 70–99)
POTASSIUM: 3.8 meq/L (ref 3.5–5.1)
POTASSIUM: 4.2 meq/L (ref 3.5–5.1)
Potassium: 3.9 mEq/L (ref 3.5–5.1)
SODIUM: 143 meq/L (ref 135–145)
SODIUM: 141 meq/L (ref 135–145)
Sodium: 141 mEq/L (ref 135–145)

## 2014-10-11 LAB — POCT URINALYSIS DIPSTICK
BILIRUBIN UA: NEGATIVE
BILIRUBIN UA: NEGATIVE
Bilirubin, UA: NEGATIVE
Blood, UA: NEGATIVE
GLUCOSE UA: NEGATIVE
Glucose, UA: NEGATIVE
Glucose, UA: NEGATIVE
Ketones, UA: NEGATIVE
Ketones, UA: NEGATIVE
Ketones, UA: NEGATIVE
LEUKOCYTES UA: NEGATIVE
LEUKOCYTES UA: NEGATIVE
Leukocytes, UA: NEGATIVE
NITRITE UA: NEGATIVE
Nitrite, UA: NEGATIVE
Nitrite, UA: NEGATIVE
Protein, UA: 100
Protein, UA: 30
Protein, UA: 30
Spec Grav, UA: >=1.03
Spec Grav, UA: >=1.03
UROBILINOGEN UA: 0.2
Urobilinogen, UA: 0.2
Urobilinogen, UA: 0.2
pH, UA: 5.5
pH, UA: 5.5
pH, UA: 5.5

## 2014-10-11 NOTE — Addendum Note (Signed)
Addended by: Geni Bers on: 10/11/2014 04:13 PM   Modules accepted: Orders

## 2014-10-11 NOTE — Progress Notes (Signed)
Pt presents for water deprivation test. She has had nothing to drink 4 hours prior to test. States she urinated 6 times last night prior to 5:00 AM, is tired from lack of sleep. Is unable to urinate at beginning of test, started at 900 AM. Beginning weight 140.6lb. At 1110, urinated 50 mL, specific gravity greater than 1.03. Weight 136.6 lb. Recheck weight at 1130 was 136.4lb. Recheck weight at 1230 was 136.6 lb. Pt complained of a headache, took Tylenol with a sip of water, also ate some crackers and half a muffin. At 1310 weight was 136.6lb. Blood glucose was 173. Urinated 30 mL, specific gravity greater than 1.03. DDAVP was administered per pt. At 1400 weight was 136.4 lb, urinated 30 mL, specific gravity greater than 1.03. Dr. Howell Rucks aware.

## 2014-10-12 ENCOUNTER — Other Ambulatory Visit: Payer: Self-pay | Admitting: *Deleted

## 2014-10-12 DIAGNOSIS — E232 Diabetes insipidus: Secondary | ICD-10-CM

## 2014-10-12 LAB — BASIC METABOLIC PANEL
BUN: 23 mg/dL (ref 6–23)
CALCIUM: 10.1 mg/dL (ref 8.4–10.5)
CO2: 28 meq/L (ref 19–32)
CREATININE: 0.86 mg/dL (ref 0.40–1.20)
Chloride: 107 mEq/L (ref 96–112)
GFR: 83.12 mL/min (ref >60.00)
GLUCOSE: 142 mg/dL — AB (ref 70–99)
POTASSIUM: 3.8 meq/L (ref 3.5–5.1)
SODIUM: 142 meq/L (ref 135–145)

## 2014-10-12 LAB — OSMOLALITY, URINE
Osmolality, Ur: 739 mOsm/kg (ref 390–1090)
Osmolality, Ur: 592 mOsm/kg (ref 390–1090)
Osmolality, Ur: 754 mOsm/kg (ref 390–1090)

## 2014-10-12 LAB — OSMOLALITY
OSMOLALITY: 304 mosm/kg — AB (ref 275–300)
Osmolality: 300 mOsm/kg (ref 275–300)

## 2014-10-12 NOTE — Telephone Encounter (Signed)
Opened in error

## 2014-10-13 NOTE — Progress Notes (Signed)
Left message for pt to return call.

## 2014-10-18 ENCOUNTER — Encounter: Payer: Self-pay | Admitting: Endocrinology

## 2014-10-18 ENCOUNTER — Ambulatory Visit (INDEPENDENT_AMBULATORY_CARE_PROVIDER_SITE_OTHER): Payer: Medicare Other | Admitting: Endocrinology

## 2014-10-18 VITALS — BP 128/76 | HR 70 | Resp 14 | Ht 62.0 in | Wt 139.8 lb

## 2014-10-18 DIAGNOSIS — E232 Diabetes insipidus: Secondary | ICD-10-CM | POA: Diagnosis not present

## 2014-10-18 MED ORDER — HYDRALAZINE HCL 10 MG PO TABS: 10.0000 mg | ORAL_TABLET | Freq: Two times a day (BID) | ORAL | Status: DC

## 2014-10-18 NOTE — Progress Notes (Signed)
HPI  Caroline Nichols is a 74 y.o.-year-old female, returning for f/u for  DI. Last visit with  me 1 month ago.   Diabetes Insipidus-  Patient reports getting dxed with this about 3-4 years ago, when she was sick with inflammtory polyarthritis. She was diagnosed with partial DI based on hx of polyuria and increased thirst ( Na was normal) by Dr Dorris Fetch In [place], [place]. Started on DDAVP 0.1 mg qhs and has been on this since then. Polyuria was initially controlled somewhat, now she she reports that she is going about 15 times during the day, out of which about 5-6 times are at night ( between 3-8am). Feels exhausted in the morning due to lack of sleep and wakes up with dry mouth and headaches. Urine volume is small but urge is present and this makes her go to the bathroom more frequently. Drinks about 64- 70 oz water daily. No recent change in meds.  Tries to limit water intake after 5-7 pm.   Then she was seen by [facility] endocrine , but evaluation could not be completed ( Dr Rock Nephew). Etiology remains unclear.Their notes were reviewed. Pituitary hormones (PRL, IGF-1, cortisol were all normal in 2014).  Reports that she was seen at [facility] as well for having a water deprivation test, however was told there that she didn't need the DDAVP a while back.    Interim HPI-  Pt reports on and off Headaches since posterior  head injury ( jar of popcorn fell off the top shelf at [facility]) ~2013. Wonders if HAs could be related to her night time admin of Hydralazine.  No change in peripheral vision Gone up half size on shoes since 2013, no change in ring size Menopause 38 years ago when she had partial hysterectomy. No breast discharge No interfering medications or CKD  Had held DDAVP 2 days in a row recently,  and didnt note any difference in the way she feels. No mental changes, unusual change in her urinary frequency or thirst. Na remained stable, but as urine osm started dropping we restarted DDAVP.   Last week, she  had a modified water deprivation test. No water intake ( except for her morning meds) 4 hours prior to arrival. She didn't have any polyuria during the testing period ( 9am to 2pm). Weight and sodium were stable ( initial weight was probably not accurate, remained stable throughout the test). She had persistent dry mouth on arrival and during the test. Unable to produce urine at all time points. All urine samples were maximally concentrated. Serum and urine osm- not all results are back - afraid that the lab has not handled these properly and I am looking at getting these results sorted out. ADH pending. Tolerated IN DDAVP well. Na post DDAVP normal. She has been taking DDAVP tablet every night since test and is here to discuss results.   Reviewed recent labs  Lab Results  Component Value Date   NA 142 10/12/2014   NA 141 10/11/2014   NA 141 10/11/2014   NA 143 10/11/2014   NA 142 09/21/2014   Lab Results  Component Value Date   GFR 83.12 10/12/2014   GFR 80.95 10/11/2014   GFR 78.88 10/11/2014   CREATININE 0.86 10/12/2014   CREATININE 0.88 10/11/2014   CREATININE 0.90 10/11/2014   Lab Results  Component Value Date   LABOSMO 304* 10/11/2014   LABOSMO 300 10/11/2014   LABOSMO 300 09/21/2014   Lab Results  Component Value Date  OSMOLALUR 739 10/11/2014   OSMOLALUR 592 10/11/2014   OSMOLALUR 754 10/11/2014   Lab Results  Component Value Date   HGBA1C 5.3 06/09/2014   HGBA1C 5.2 12/27/2012   Lab Results  Component Value Date   CALCIUM 10.1 10/12/2014     PE: BP 128/76 mmHg  Pulse 70  Resp 14  Ht 5\' 2"  (1.575 m)  Wt 139 lb 12 oz (63.39 kg)  BMI 25.55 kg/m2  SpO2 98% Wt Readings from Last 3 Encounters:  10/18/14 139 lb 12 oz (63.39 kg)  10/11/14 140 lb 9.6 oz (63.776 kg)  09/20/14 138 lb 8 oz (62.823 kg)   Exam: deferred  ASSESSMENT: 1. ? DI on DDAVP therapy and stable  PLAN:  Problem List Items Addressed This Visit      Endocrine   Diabetes insipidus -  Primary    Based on testing done so far, it seems that she can concentrate her urine after holding water intake just for 4 hours. Her Na remained in normal range, in presence of serum osm >295 during the test and urine SG was maximally concentrated. She did not have polyuria during the test. Weight remained stable. Not all the time points results were run by the lab for Serum osm and urine osm. ADH pending.  On further questioning, she now- a -days has increased urinary urge with production of small volume urine except around lunch time, when urinary volume is more.  She has persistent dry mouth, worse in the morning. I wonder if this is related to her meds or mouth breathing at night time. She does have dentures that fit well. Uses humidifier at night time.   Given above factors, feel that she doesn't have Diabetes insipidus. I think we can stop her DDAVP at this time and assess her response after prolonged withholding of med. Thirst is not a reliable symptom for her. I have asked her to follow the following symptoms for monitoring symptoms after stopping DDAVP- decreasing weight, increased high volume urination, MS changes or new headaches. She will notify me if this occurs. She will return this Friday for repeat osm and Na. If these are normal and there is no change in her symptoms then we can repeat labs weekly x 2 weeks and then in 1 month after that.    Following this trial off the DDAVP, she could try taking her hydralazine in the morning to see if she feels better with respect to her HAs.              Relevant Orders   Basic metabolic panel   Osmolality   Osmolality, urine       Follow back in 2 month  Rolando Whitby [facility] 10/19/2014 9:49 AM

## 2014-10-18 NOTE — Patient Instructions (Addendum)
Can go ahead and stop DDAVP tablet.  Weigh yourself daily. Notify me if you get weigh starts going down and you weigh less than 133 lbs ( baseline weight 138-140 lbs)  Notify me if you have big volume urination that is increased in frequency.  Notify if you have different headaches, confusion.   Return for labs this Friday.   Please come back for a follow-up appointment in 2 months.

## 2014-10-18 NOTE — Progress Notes (Signed)
Pre visit review using our clinic review tool, if applicable. No additional management support is needed unless otherwise documented below in the visit note. 

## 2014-10-19 LAB — ARGININE VASOPRESSIN HORMONE: Arginine Vasopressin: 1.3 pg/mL

## 2014-10-19 NOTE — Assessment & Plan Note (Signed)
Based on testing done so far, it seems that she can concentrate her urine after holding water intake just for 4 hours. Her Na remained in normal range, in presence of serum osm >295 during the test and urine SG was maximally concentrated. She did not have polyuria during the test. Weight remained stable. Not all the time points results were run by the lab for Serum osm and urine osm. ADH pending.  On further questioning, she now- a -days has increased urinary urge with production of small volume urine except around lunch time, when urinary volume is more.  She has persistent dry mouth, worse in the morning. I wonder if this is related to her meds or mouth breathing at night time. She does have dentures that fit well. Uses humidifier at night time.   Given above factors, feel that she doesn't have Diabetes insipidus. I think we can stop her DDAVP at this time and assess her response after prolonged withholding of med. Thirst is not a reliable symptom for her. I have asked her to follow the following symptoms for monitoring symptoms after stopping DDAVP- decreasing weight, increased high volume urination, MS changes or new headaches. She will notify me if this occurs. She will return this Friday for repeat osm and Na. If these are normal and there is no change in her symptoms then we can repeat labs weekly x 2 weeks and then in 1 month after that.    Following this trial off the DDAVP, she could try taking her hydralazine in the morning to see if she feels better with respect to her HAs.

## 2014-10-21 ENCOUNTER — Other Ambulatory Visit (INDEPENDENT_AMBULATORY_CARE_PROVIDER_SITE_OTHER): Payer: Medicare Other

## 2014-10-21 DIAGNOSIS — E232 Diabetes insipidus: Secondary | ICD-10-CM | POA: Diagnosis not present

## 2014-10-21 LAB — BASIC METABOLIC PANEL
BUN: 22 mg/dL (ref 6–23)
CALCIUM: 9.3 mg/dL (ref 8.4–10.5)
CO2: 26 mEq/L (ref 19–32)
Chloride: 106 mEq/L (ref 96–112)
Creatinine, Ser: 0.83 mg/dL (ref 0.40–1.20)
GFR: 86.6 mL/min (ref >60.00)
Glucose, Bld: 82 mg/dL (ref 70–99)
Potassium: 4 mEq/L (ref 3.5–5.1)
SODIUM: 138 meq/L (ref 135–145)

## 2014-10-22 LAB — OSMOLALITY, URINE: OSMOLALITY UR: 561 mosm/kg (ref 390–1090)

## 2014-10-22 LAB — OSMOLALITY: Osmolality: 296 mOsm/kg (ref 275–300)

## 2014-10-25 ENCOUNTER — Other Ambulatory Visit (INDEPENDENT_AMBULATORY_CARE_PROVIDER_SITE_OTHER): Payer: Medicare Other

## 2014-10-25 DIAGNOSIS — E232 Diabetes insipidus: Secondary | ICD-10-CM | POA: Diagnosis not present

## 2014-10-25 LAB — SODIUM: SODIUM: 141 meq/L (ref 135–145)

## 2014-10-25 NOTE — Addendum Note (Signed)
Addended by: Karlene Einstein D on: 10/25/2014 04:57 PM   Modules accepted: Orders

## 2014-10-26 LAB — OSMOLALITY: OSMOLALITY: 300 mosm/kg (ref 275–300)

## 2014-10-26 LAB — OSMOLALITY, URINE: Osmolality, Ur: 405 mOsm/kg (ref 390–1090)

## 2014-11-03 ENCOUNTER — Other Ambulatory Visit: Payer: Medicare Other

## 2014-11-04 ENCOUNTER — Other Ambulatory Visit: Payer: Self-pay | Admitting: Endocrinology

## 2014-11-04 ENCOUNTER — Other Ambulatory Visit (INDEPENDENT_AMBULATORY_CARE_PROVIDER_SITE_OTHER): Payer: Medicare Other

## 2014-11-04 DIAGNOSIS — E232 Diabetes insipidus: Secondary | ICD-10-CM | POA: Diagnosis not present

## 2014-11-05 LAB — OSMOLALITY: Osmolality: 305 mOsm/kg — ABNORMAL HIGH (ref 275–300)

## 2014-11-08 ENCOUNTER — Other Ambulatory Visit: Payer: Self-pay | Admitting: Endocrinology

## 2014-11-08 ENCOUNTER — Other Ambulatory Visit: Payer: Self-pay | Admitting: *Deleted

## 2014-11-08 DIAGNOSIS — E232 Diabetes insipidus: Secondary | ICD-10-CM

## 2014-11-08 LAB — SODIUM

## 2014-11-08 LAB — BASIC METABOLIC PANEL

## 2014-11-10 ENCOUNTER — Other Ambulatory Visit (INDEPENDENT_AMBULATORY_CARE_PROVIDER_SITE_OTHER): Payer: Medicare Other

## 2014-11-10 ENCOUNTER — Telehealth: Payer: Self-pay | Admitting: Nurse Practitioner

## 2014-11-10 DIAGNOSIS — D72819 Decreased white blood cell count, unspecified: Secondary | ICD-10-CM | POA: Diagnosis not present

## 2014-11-10 DIAGNOSIS — G629 Polyneuropathy, unspecified: Secondary | ICD-10-CM | POA: Diagnosis not present

## 2014-11-10 DIAGNOSIS — R748 Abnormal levels of other serum enzymes: Secondary | ICD-10-CM | POA: Diagnosis not present

## 2014-11-10 DIAGNOSIS — E232 Diabetes insipidus: Secondary | ICD-10-CM | POA: Diagnosis not present

## 2014-11-10 DIAGNOSIS — M1990 Unspecified osteoarthritis, unspecified site: Secondary | ICD-10-CM | POA: Diagnosis not present

## 2014-11-10 LAB — BASIC METABOLIC PANEL
BUN: 21 mg/dL (ref 6–23)
CHLORIDE: 103 meq/L (ref 96–112)
CO2: 30 mEq/L (ref 19–32)
Calcium: 9.4 mg/dL (ref 8.4–10.5)
Creatinine, Ser: 0.85 mg/dL (ref 0.40–1.20)
GFR: 84.24 mL/min (ref >60.00)
Glucose, Bld: 74 mg/dL (ref 70–99)
POTASSIUM: 3.9 meq/L (ref 3.5–5.1)
Sodium: 139 mEq/L (ref 135–145)

## 2014-11-10 NOTE — Telephone Encounter (Signed)
Pt wanted to let Dr. Howell Rucks know that she is doing good but feels weak.msn

## 2014-11-10 NOTE — Telephone Encounter (Signed)
Dr. Howell Rucks spoke with patient before patient left the office.

## 2014-11-10 NOTE — Telephone Encounter (Signed)
Spoke to the patient regarding her symptoms. Patient reports that she has tried to cut back on her water intake after supper and has decreased freq of urination over night. Still wakes up with dry mouth, and has evaluation with Dr Derrel Nip the coming week for this.  Overall no increased thirst, urination after stopping DDAVP. Weight the same at home as well. No mental confusion or HAs symptoms. Had labs today. Has follow up with rheumatology today at 11 pm to see if RA related to weakness.  Follows with Dr Jefm Bryant.

## 2014-11-11 ENCOUNTER — Other Ambulatory Visit: Payer: Self-pay | Admitting: *Deleted

## 2014-11-11 LAB — OSMOLALITY, URINE: OSMOLALITY UR: 293 mosm/kg — AB (ref 390–1090)

## 2014-11-11 LAB — OSMOLALITY: Osmolality: 293 mOsm/kg (ref 275–300)

## 2014-11-11 MED ORDER — LEVOTHYROXINE SODIUM 112 MCG PO TABS: ORAL_TABLET | ORAL | Status: DC

## 2014-11-11 MED ORDER — HYDROXYCHLOROQUINE SULFATE 200 MG PO TABS: 400.0000 mg | ORAL_TABLET | Freq: Every day | ORAL | Status: DC

## 2014-11-14 ENCOUNTER — Ambulatory Visit (INDEPENDENT_AMBULATORY_CARE_PROVIDER_SITE_OTHER): Payer: Medicare Other | Admitting: Internal Medicine

## 2014-11-14 ENCOUNTER — Encounter: Payer: Self-pay | Admitting: Internal Medicine

## 2014-11-14 VITALS — BP 132/78 | HR 70 | Temp 97.6°F | Resp 14 | Ht 62.0 in | Wt 138.2 lb

## 2014-11-14 DIAGNOSIS — I10 Essential (primary) hypertension: Secondary | ICD-10-CM

## 2014-11-14 DIAGNOSIS — K117 Disturbances of salivary secretion: Secondary | ICD-10-CM | POA: Insufficient documentation

## 2014-11-14 DIAGNOSIS — R739 Hyperglycemia, unspecified: Secondary | ICD-10-CM

## 2014-11-14 DIAGNOSIS — E232 Diabetes insipidus: Secondary | ICD-10-CM

## 2014-11-14 DIAGNOSIS — R682 Dry mouth, unspecified: Secondary | ICD-10-CM

## 2014-11-14 MED ORDER — FLUTICASONE PROPIONATE 50 MCG/ACT NA SUSP: 2.0000 | Freq: Every day | NASAL | Status: DC

## 2014-11-14 MED ORDER — GLUCOSE BLOOD VI STRP: ORAL_STRIP | Status: DC

## 2014-11-14 NOTE — Progress Notes (Signed)
Patient ID: Caroline Nichols, female   DOB: 1941-02-13, 74 y.o.   MRN: [id number]  Patient Active Problem List   Diagnosis Date Noted  . Xerostomia 11/14/2014  . Viral pharyngitis 07/15/2014  . Hyperglycemia 06/12/2014  . Diabetes insipidus 06/12/2014  . S/P hysterectomy 06/09/2014  . Medication management 11/11/2013  . Atherosclerotic peripheral vascular disease with intermittent claudication 10/07/2013  . Edema of both legs 10/07/2013  . Venous stasis dermatitis 10/07/2013  . Chest pain 03/26/2013  . Left carotid bruit 03/26/2013  . Anemia of chronic disease 03/20/2013  . Polyarthralgia 03/20/2013  . Former smoker 03/20/2013  . IBS (irritable bowel syndrome)   . Degenerative arthritis of lumbar spine   . MURMUR 09/27/2009  . Postablative hypothyroidism 05/15/2009  . ANXIETY STATE, UNSPECIFIED 05/15/2009  . Essential hypertension 05/15/2009    Subjective:  CC:   Chief Complaint  Patient presents with  . Acute Visit    Difficulty swallowing due to dry mouth.    HPI:   Caroline Nichols is a 74 y.o. female who presents for follow up on hypertension and other issues .  She has been having trouble with dry mouth,  Dry eyes.  Patient states that symptoms have progressed since saw Dr Karolee Stamps .  Finds herself getting overheated at night,  Then wakes up with some sinus congestion Which is chronic and unevaluated,  Thinks she may be mouth breathing..  Using humidifier,  No significant change.  Did not mention it to rheumatologist at recent appointment so screening for Sjogren's syndrome was not done.   Taking hydralazine 10 mg bid. bp  Has improved since stopping  DDAVP.  No change in urinary frequency     Gabapentin causes dry mouth in 5%.  Has been taking it for about 6 months      Past Medical History  Diagnosis Date  . Hypertension   . Hyperthyroidism     s/p radiation therapy, now hypothyroidism  . IBS (irritable bowel syndrome)   . Herniated disc   . Pancreatic cyst   .  Arthritis     Possible RA - Follow up appt with Dr. Jefm Bryant  . H/O transfusion of packed red blood cells 02/2013    3 units PRBC for severe anemia 02/2013  . Anemia 02/2013    F/U with Dr. Grayland Ormond for Feraheme injections  . Hyperlipidemia   . Allergy   . History of kidney stones   . Migraine   . Cellulitis of arm, right   . Paroxysmal a-fib   . Diabetes insipidus     On desmopressin    Past Surgical History  Procedure Laterality Date  . Tubal ligation    . Cervical spine surgery      Bone fusion  . Hammer toe surgery    . Hernia repair    . Abdominal hysterectomy    . Breast surgery  1990s    Biopsy       The following portions of the patient's history were reviewed and updated as appropriate: Allergies, current medications, and problem list.    Review of Systems:   Patient denies headache, fevers, malaise, unintentional weight loss, skin rash, eye pain, sinus congestion and sinus pain, sore throat, dysphagia,  hemoptysis , cough, dyspnea, wheezing, chest pain, palpitations, orthopnea, edema, abdominal pain, nausea, melena, diarrhea, constipation, flank pain, dysuria, hematuria, urinary  Frequency, nocturia, numbness, tingling, seizures,  Focal weakness, Loss of consciousness,  Tremor, insomnia, depression, anxiety, and suicidal ideation.     History  Social History  . Marital Status: Widowed    Spouse Name: N/A  . Number of Children: 4  . Years of Education: 12   Occupational History  . Textiles     Retired  . [employer]     Retired   Social History Main Topics  . Smoking status: Former Smoker -- 0.50 packs/day for 30 years    Types: Cigarettes    Quit date: 12/03/2012  . Smokeless tobacco: Never Used  . Alcohol Use: No  . Drug Use: No  . Sexual Activity: No   Other Topics Concern  . Not on file   Social History Narrative   Caroline Nichols was born and reared in [place]. She is a widow since 46. She was married for 48  years. They had 4 children (daughters). She is currently leaving at [facility] since June. 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 She enjoys shopping. She also enjoys music, word puzzles and she loves spending time with her family. She loves attending church. One of her favorite things is wearing hats. She absolutely loves hats!    Objective:  Filed Vitals:   11/14/14 1029  BP: 132/78  Pulse: 70  Temp: 97.6 F (36.4 C)  Resp: 14     General appearance: alert, cooperative and appears stated age Ears: normal TM's and external ear canals both ears Throat: lips, mucosa, and tongue normal; teeth and gums normal Neck: no adenopathy, no carotid bruit, supple, symmetrical, trachea midline and thyroid not enlarged, symmetric, no tenderness/mass/nodules Back: symmetric, no curvature. ROM normal. No CVA tenderness. Lungs: clear to auscultation bilaterally Heart: regular rate and rhythm, S1, S2 normal, no murmur, click, rub or gallop Abdomen: soft, non-tender; bowel sounds normal; no masses,  no organomegaly Pulses: 2+ and symmetric Skin: Skin color, texture, turgor normal. No rashes or lesions Lymph nodes: Cervical, supraclavicular, and axillary nodes normal.  Assessment and Plan:  Essential hypertension Well controlled on current regimen. Renal function stable, no changes today. BP 132/78 mmHg  Pulse 70  Temp(Src) 97.6 F (36.4 C) (Oral)  Resp 14  Ht 5\' 2"  (1.575 m)  Wt 138 lb 4 oz (62.71 kg)  BMI 25.28 kg/m2  SpO2 97%  Lab Results  Component Value Date   CREATININE 0.85 11/10/2014   Lab Results  Component Value Date   NA 139 11/10/2014   K 3.9 11/10/2014   CL 103 11/10/2014   CO2 30 11/10/2014      Diabetes insipidus Apparently she has maintained  normal volume status and normal sodium levels since the DDAVP was discontinued last month during Dr. Bertram Gala much appreciated evaluation.    Lab Results  Component Value Date   NA 139 11/10/2014   K 3.9 11/10/2014   CL 103 11/10/2014   CO2 30 11/10/2014      Hyperglycemia She has no evidence of DM by recetn a1c . Lab Results  Component Value Date   HGBA1C 5.3 06/09/2014      HTN (hypertension)     Xerostomia With dry eye,  Etiology may be Sjogren's Syndrome vs mouth breathing vs medication (gabapentin).  Advised to treat sinus congestion and suspend the gabapentin since it is not helping her neuropathy.  Sjogren's antibody panel has been ordered.     Updated Medication List Outpatient Encounter Prescriptions as of 11/14/2014  Medication Sig  . acetaminophen (TYLENOL) 325 MG tablet Take 2  tablets (650 mg total) by mouth every 6 (six) hours as needed for pain.  Marland Kitchen ALPRAZolam (XANAX) 0.5 MG tablet Take 1 tablet (0.5 mg total) by mouth 3 (three) times daily as needed for anxiety.  Marland Kitchen atorvastatin (LIPITOR) 20 MG tablet Take 1 tablet (20 mg total) by mouth daily.  . brimonidine (ALPHAGAN) 0.2 % ophthalmic solution Place 1 drop into both eyes 2 (two) times daily.  . Calcium-Vitamin D (SUPER CALCIUM/D) 600-125 MG-UNIT TABS Take by mouth. Taking 2 daily  . Cholecalciferol (D3-1000 PO) Take by mouth daily.  Marland Kitchen desmopressin (STIMATE) 1.5 MG/ML SOLN Place 1 spray (0.15 mg total) into the nose daily. For testing purposes  . ferrous sulfate 325 (65 FE) MG tablet Take 325 mg by mouth daily with breakfast.  . folic acid (FOLVITE) 1 MG tablet Take 1 mg by mouth daily.  Marland Kitchen gabapentin (NEURONTIN) 300 MG capsule TAKE 1 CAPSULE (300 MG TOTAL) BY MOUTH 3 (THREE) TIMES DAILY.  Marland Kitchen glucose blood test strip One Touch check sugar once daily. Dx: R73.9  . hydrALAZINE (APRESOLINE) 10 MG tablet Take 1 tablet (10 mg total) by mouth 2 (two) times daily.  . hydroxychloroquine (PLAQUENIL) 200 MG tablet Take 2 tablets (400 mg total) by mouth daily.  Marland Kitchen levothyroxine (SYNTHROID, LEVOTHROID) 112 MCG tablet TAKE 1 TABLET (112 MCG TOTAL) BY MOUTH  DAILY BEFORE BREAKFAST.  Marland Kitchen lisinopril (PRINIVIL,ZESTRIL) 10 MG tablet Take 1 tablet (10 mg total) by mouth daily.  . nitroGLYCERIN (NITROSTAT) 0.4 MG SL tablet Place 1 tablet (0.4 mg total) under the tongue every 5 (five) minutes as needed.  . Omega-3 Fatty Acids (FISH OIL) 1000 MG CAPS Take by mouth daily.  . pantoprazole (PROTONIX) 40 MG tablet TAKE 1 TABLET (40 MG TOTAL) BY MOUTH DAILY.  . vitamin C (ASCORBIC ACID) 500 MG tablet Take 500 mg by mouth daily.  . [DISCONTINUED] glucose blood test strip One Touch check sugar once daily. Dx: R73.9  . fluticasone (FLONASE) 50 MCG/ACT nasal spray Place 2 sprays into both nostrils daily.  . [DISCONTINUED] desmopressin (DDAVP) 0.2 MG tablet Take 0.5 tablets (0.1 mg total) by mouth at bedtime. (Patient not taking: Reported on 11/14/2014)     Orders Placed This Encounter  Procedures  . Sjogrens syndrome-A extractable nuclear antibody  . Sjogrens syndrome-B extractable nuclear antibody    Return in about 4 weeks (around 12/12/2014).

## 2014-11-14 NOTE — Patient Instructions (Addendum)
Your dry mouth may be due to medications , mouth breathing,  Or Sjogren's Syndrome.   want treat your sinus congestion to see if the dry mouth improves.   Use Afrin nasal spray on each side for 5 days then stop so you don't get addicted to it  Start using a steroid nasal spray Flonase     You can use miralax as often as ONE TIME DAILY to help your stools mover more easily  Please take a probiotic ( Align, Floraque or Culturelle) whenever you are treated with an  antibiotic to prevent a serious antibiotic associated diarrhea  Called clostridium dificile colitis   [facility] and most major pharmacies have generic versions of these to use,      Sore or Dry Mouth Care A sore or dry mouth may happen for many different reasons. Sometimes, treatment for other health problems may have to stop until your sore or dry mouth gets better.  HOME CARE  Do not smoke or chew tobacco.  Use fake (artificial) saliva when your mouth feels dry.  Use a humidifier in your bedroom at night.  Eat small meals and snacks.  Eat food cold or at room temperature.  Suck on ice-chips or try frozen ice pops or juice bars, ice-cream, and watermelon. Do not have citrus flavors.  Suck on hard, sugarless, sour candy, or chew sugarless gum to help make more saliva.  Eat soft foods such as yogurt, bananas, canned fruit, mashed potatoes, oatmeal, rice, eggs, cottage cheese, macaroni and cheese, jello, and pudding.  Microwave vegetables and fruits to soften them.  Puree cooked food in a blender if needed.  Make dry food moist by using olive oil, gravy, or mild sauces. Dip foods in liquids.  Keep a glass of water or squirt bottle nearby. Take sips often throughout the day.  Limit caffeine.  Avoid:  Pop or fizzy drinks.  Alcohol.  Citrus juices.  Acidic food.  Salty or spicy food.  Foods or drinks that are very hot.  Hard or crunchy food. Mouth Care  Wash your hands well with soap and water before doing  mouth care.  Use fake saliva as told by your doctor.  Use medicine on the sore places.  Brush your teeth at least 2 times a day. Brush after each meal if possible. Rinse your mouth with water after each meal and after drinking a sweet drink.  Brush slowly and gently in small circles. Do not brush side-to-side.  Use regular toothpastes, but stay away from ones that have sodium laurel sulfate in them.  Gargle with a baking soda mouthwash ( teaspoon baking soda mixed in with 4 cups of water).  Gargle with medicated mouthwash.  Use dental floss or dental tape to clean between your teeth every day.  Use a lanolin-based lip balm to keep your lips from getting dry.  If you wear dentures or bridges:  You may need to leave them out until your doctor tells you to start wearing them again.  Take them out at night if you wear them daily. Soak them in warm water or denture solution. Take your dentures out as much as you can during the day. Take them out when you use mouthwash.  After each meal, brush your gums gently with a soft brush and rinse your mouth with water.  If your dentures rub on your gums and cause a sore spot, have your dentist check and fix your dentures right away. GET HELP RIGHT AWAY IF:  Your mouth gets more painful or dry.  You have questions. MAKE SURE YOU:  Understand these instructions.  Will watch your condition.  Will get help right away if you are not doing well or get worse. Document Released: 05/19/2009 Document Revised: 10/14/2011 Document Reviewed: 05/19/2009 [facility] Patient Information 2015 [place], [place]. This information is not intended to replace advice given to you by your health care provider. Make sure you discuss any questions you have with your health care provider.

## 2014-11-15 ENCOUNTER — Encounter: Payer: Self-pay | Admitting: Internal Medicine

## 2014-11-15 LAB — SJOGRENS SYNDROME-A EXTRACTABLE NUCLEAR ANTIBODY: SSA (RO) (ENA) ANTIBODY, IGG: NEGATIVE

## 2014-11-15 LAB — SJOGRENS SYNDROME-B EXTRACTABLE NUCLEAR ANTIBODY: SSB (LA) (ENA) ANTIBODY, IGG: NEGATIVE

## 2014-11-15 NOTE — Assessment & Plan Note (Signed)
Well controlled on current regimen. Renal function stable, no changes today. BP 132/78 mmHg  Pulse 70  Temp(Src) 97.6 F (36.4 C) (Oral)  Resp 14  Ht 5\' 2"  (1.575 m)  Wt 138 lb 4 oz (62.71 kg)  BMI 25.28 kg/m2  SpO2 97%  Lab Results  Component Value Date   CREATININE 0.85 11/10/2014   Lab Results  Component Value Date   NA 139 11/10/2014   K 3.9 11/10/2014   CL 103 11/10/2014   CO2 30 11/10/2014

## 2014-11-15 NOTE — Assessment & Plan Note (Signed)
Apparently she has maintained  normal volume status and normal sodium levels since the DDAVP was discontinued last month during Dr. Bertram Gala much appreciated evaluation.   Lab Results  Component Value Date   NA 139 11/10/2014   K 3.9 11/10/2014   CL 103 11/10/2014   CO2 30 11/10/2014

## 2014-11-15 NOTE — Assessment & Plan Note (Signed)
With dry eye,  Etiology may be Sjogren's Syndrome vs mouth breathing vs medication (gabapentin).  Advised to treat sinus congestion and suspend the gabapentin since it is not helping her neuropathy.  Sjogren's antibody panel has been ordered.

## 2014-11-15 NOTE — Assessment & Plan Note (Signed)
She has no evidence of DM by recetn a1c . Lab Results  Component Value Date   HGBA1C 5.3 06/09/2014

## 2014-11-16 ENCOUNTER — Encounter: Payer: Self-pay | Admitting: *Deleted

## 2014-11-25 NOTE — H&P (Signed)
PATIENT NAME:  Caroline Nichols, Caroline Nichols MR#:  [id number] DATE OF BIRTH:  January 25, 1941  DATE OF ADMISSION:  02/22/2013  PRIMARY CARE PROVIDER: Dr. Rory Percy in [place], [place].   ED REFERRING PHYSICIAN:  Dr. Harvest Dark   REASON FOR ADMISSION: Generalized weakness, fatigue, severe anemia.   HISTORY OF PRESENT ILLNESS: The patient is a 74 year old [place] American female who recently moved from [place], [place]. Reports that she went to see her doctor back in [place], [place] last Thursday, they drew some blood and then they called her today stating that her hemoglobin was very low and needed to go to come to the nearest Emergency Room. The patient reports that over the past 1 to 2 weeks, she has been having progressive weakness and fatigue with any type of activity. She reports no shortness of breath or any chest pains on exertion. She denies any dizziness, but does report that first gait has been little staggering because of weakness. She reports that she was actually hospitalized in [place] in November. At that time, she was noted to have anemia and just like now. Received 2 units of packed RBCs, received iron transfusion and was to follow up with hematology in the coming weeks. Because she moved, she was not able to follow up. The patient also reports that she had colonoscopy and endoscopy in July 2013. At that time, EGD was negative. Her colonoscopy showed a possible tear somewhere in her rectum and clips were placed. She was not having any bleeding prior to that. She otherwise denies any abdominal pain. No hematemesis. No hematochezia. Denies any blood in the stool. The patient denies any abdominal pain. She denies any NSAID use. The patient also reports that she has been having problems with significant joint pains with swelling of her wrist joints and finger joints as well as her shoulder. She has difficulty lifting her arms all the way up bilaterally. She otherwise denies any fevers  or chills or any weight loss.   PAST MEDICAL HISTORY: 1.  History of diabetic insipidus.  2.  Hypertension.  3.  History of hyperthyroidism status post radiation therapy, now hypothyroidism.  4.  History of cellulitis of the arm.  5.  History of rectal tear.  6.  History of atrial fibrillation, which is likely paroxysmal.   PAST SURGICAL HISTORY:  1.  Status post tubal ligation. 2.  Status post partial hysterectomy.  3.  Status post abdominal hernia repair.  4.  Status post hammertoe surgery.  5.  Status post bone fusion in the cervical spine.   ALLERGIES: CODEINE AND TETANUS.   MEDICATIONS: Alprazolam 0.5 mg 1 tab p.o. t.i.d. as needed, amlodipine 10 daily, desmopressin 0.2, 1/2 tab p.o. at bedtime, levothyroxine 125 mcg daily, lisinopril 10 daily, Protonix 40 daily, propranolol 80 mg 1 tab p.o. daily.   SOCIAL HISTORY: History of smoking, quit about a month ago. No alcohol or drug use.   FAMILY HISTORY: Positive for coronary artery disease.   REVIEW OF SYSTEMS:  CONSTITUTIONAL: Denies any fevers. Complains of fatigue, weakness, complains of joint pains. No weight loss. No weight gain.  EYES: No blurred or double vision. No redness. No inflammation. No glaucoma. No cataracts.  ENT: No tinnitus. No ear pain. No hearing loss. No seasonal or year-round allergies. No epistaxis. No nasal discharge. No difficulty swallowing.  RESPIRATORY: Denies any cough, wheezing. No hemoptysis. No chronic obstructive pulmonary disease, no tuberculosis.  CARDIOVASCULAR: Denies any chest pain, orthopnea, edema or arrhythmia. No palpitations.  GASTROINTESTINAL: No nausea, vomiting, diarrhea. No abdominal pain. No hematemesis. No melena. No ulcer. No changes in bowel habits.  GENITOURINARY: Denies any dysuria, hematuria, renal calculus or frequency.  ENDOCRINE: Denies any polyuria, nocturia. Has hypothyroidism.  HEMATOLOGIC AND LYMPHATIC: History of anemia. Denies easy bruisability or bleeding.  SKIN: No  acne. No rash. No changes in mole, hair or skin.  MUSCULOSKELETAL: Has pain in multiple joints as described above.  NEUROLOGIC: No numbness. No CVA. No transient ischemic attack. No seizures.  PSYCHIATRIC: No anxiety. No insomnia. No ADD.   PHYSICAL EXAMINATION: VITAL SIGNS: Temperature 98.3, pulse 73, respirations 16, blood pressure 173/70 and O2 96%.   GENERAL: The patient is a well-developed [place] American female, currently not in any acute distress.  HEENT: Head normocephalic, atraumatic. Pupils equally round, reactive to light and accommodation. She has some conjunctival pallor. No scleral icterus. Extraocular movements intact. Nasal exam shows nasal lesions or drainage.  EARS: There is no erythema or drainage.  MOUTH: There is no exudate.  NECK: Supple and symmetric. No masses. Thyroid midline, not enlarged. No JVD.  RESPIRATORY: Good respiratory effort. Clear to auscultation bilaterally without any rales, rhonchi, or wheezing.  CARDIOVASCULAR: Regular rate and rhythm. No murmurs, rubs, clicks or gallops. PMI is not displaced.  ABDOMEN: Soft, nontender, nondistended. Positive bowel sounds x 4.  GENITOURINARY: It was done by the ED physician and she was guaiac-negative.  MUSCULOSKELETAL: There is no erythema or swelling currently noted. Limited range of motion in bilateral upper extremities.  SKIN: There is no rash.  LYMPHATICS: No lymph nodes palpable.  VASCULAR: Good DP, PT pulses.  PSYCHIATRIC: Not anxious or depressed.  NEUROLOGIC: Cranial nerves II through XII grossly intact. No focal deficits.   LABORATORY, DIAGNOSTIC AND RADIOLOGIC DATA:  Glucose 83, BUN 19, creatinine 0.79, sodium 136, potassium 4.1, chloride 106, CO2 was 26, calcium 8.7.   LFTs: AST was 64 the rest of the LFTs were normal.   CPK was 244, troponin less than 0.02. WBC 3.8, hemoglobin 6.3, platelet count 195.   ASSESSMENT AND PLAN: The patient is a 74 year old African American female with history of chronic  anemia, requiring a hospitalization in November, received 2 units of packed red blood cells and iron infusion.  She had negative gastrointestinal work-up last year, presents with weakness, staggering gait.  1.  Severe symptomatic anemia, likely due to worsening of her chronic anemia. At this time, the patient has been consented for transfusion. We will give her one packed red blood cells. We will recheck iron, ferritin, B12. She may need IV iron therapy. We will ask hematology to see. She will also need outpatient close follow-up with hematology. Since she had a gastrointestinal work up within the past year, I will hold off on that.  2.  Arthritis which sounds inflammatory. She has weakness in her upper extremities. She has a borderline elevated CPK. At this time, I will check an ESR, ANA, she will need outpatient  rheumatological follow-up.  3.  Diabetes insipidus. Continue desmopressin.  4.  Hypertension. We will continue  amlodipine. 5.  Hypothyroidism. We will continue Synthroid.  6.  Miscellaneous. In light of her significant anemia, we will hold her aspirin as well as any deep vein thrombosis prophylaxis with anticoagulants.   TIME SPENT:  45 minutes.   ____________________________ Lafonda Mosses Posey Pronto, MD shp:cc D: 02/22/2013 15:56:08 ET T: 02/22/2013 16:37:49 ET JOB#: [id number]  cc: Deserae Jennings H. Posey Pronto, MD, <Dictator> Alric Seton MD ELECTRONICALLY SIGNED 03/08/2013 8:58

## 2014-11-25 NOTE — Discharge Summary (Signed)
PATIENT NAME:  Caroline Nichols, AYUSO MR#:  [id number] DATE OF BIRTH:  [birth year]  DATE OF ADMISSION:  02/22/2013 DATE OF DISCHARGE:  02/24/2013  DISCHARGE DIAGNOSES: 1.  Anemia of chronic disease and iron deficiency.  2.  Possible rheumatoid arthritis.  3.  Diabetes insipidus.  4.  Hypertension.  5.  Hypothyroidism.   IMAGING STUDIES: None.   CONSULT: Dr. Grayland Ormond of hematology/oncology.   ADMITTING HISTORY AND PHYSICAL AND HOSPITAL COURSE: Please see detailed H and P dictated by Dr. Posey Pronto. In brief, a 74 year old [place] American female patient who recently moved into town from [place], [place], presented to the hospital complaining of generalized weakness and fatigue with severe anemia. The patient was admitted for blood transfusion and further workup. The patient had her iron studies checked which showed anemia of chronic disease, iron deficiency. The patient was seen by Dr. Grayland Ormond, of hematology, who suggested also giving her Feraheme, 1 dose was given later. The patient is to follow up in 3 days with him for further dosing. The patient did receive 3 units packed RBCs in the hospital. Hemoglobin is stable. The patient had a GI workup previously as outpatient which just showed a rectal tear which has resolved. The patient feels much better and is being discharged home.   The patient does have complaints of polyarthralgias which are chronic. Workup here in the hospital showed possible rheumatoid arthritis, and a follow-up appointment has been set up with Dr. Jefm Bryant, with rheumatology.   On the day of discharge, the patient does not have any tenderness in the joints although complains of pain. No abdominal tenderness on exam.   DISCHARGE MEDICATIONS: Include: 1.  Alprazolam 0.5 mg oral 3 times a day as needed.  2.  Amlodipine 10 mg oral once a day.  3.  Desmopressin 0.2 mg 1/2 tablet oral once a day.  4.  Levothyroxine 125 mcg oral once a day.  5.  Lisinopril 10 mg oral once a day.  6.   Protonix 40 mg oral once a day.  7.  Propranolol 80 mg oral once a day.  8.  Ferrous sulfate 325 mg oral 3 times a day with meals.  9.  Acetaminophen 650 mg oral every 6 hours as needed for pain or temperature.   DISCHARGE INSTRUCTIONS:  1.  Low-sodium diet.  2.  Activity as tolerated.  3.  Follow up with Dr. Jefm Bryant, of rheumatology, in 1 to 2 weeks and with primary care physician in 1 to 2 weeks. She will also follow up with Dr. Grayland Ormond in 3 to 4 days for her Feraheme injection.   TIME SPENT: On day of discharge in discharge activity was 33 minutes.   ____________________________ Leia Alf Lenaya Pietsch, MD srs:cb D: 02/25/2013 16:03:13 ET T: 02/25/2013 20:39:00 ET JOB#: [id number]  cc: Alveta Heimlich R. Hewitt Garner, MD, <Dictator> Neita Carp MD ELECTRONICALLY SIGNED 02/28/2013 20:58

## 2014-11-25 NOTE — Consult Note (Signed)
History of Present Illness:  Reason for Consult Iron deficiency anemia.   HPI   Patient is a 74 year old female who recently moved to the area.  Prior to living, she saw her primary care physician and subsequently noted a significantly decreased hemoglobin and instructed patient to proceed to the nearest emergency room.  Patient reports over the past 1-2 weeks she has been noting progressive weakness and fatigue.  She otherwise has felt well.  She has no neurologic complaints.  She denies any recent fevers or illnesses.  She has a good appetite and denies weight loss.  She has no chest pain, shortness of breath, or dyspnea on exertion.  She denies any nausea, vomiting, constipation, or diarrhea.  She is noted no melena or hematochezia.  She has no urinary complaints.  Patient otherwise feels well and offers no further specific complaints.  PFSH:  Additional Past Medical and Surgical History diabetes insipidus, hypertension, hypothyroidism, "rectal tear", proximal atrial fibrillation, tubal ligation, partial hysterectomy, hernia repair, hammertoe surgery, cervical spine fusion.  Social history: Positive tobacco, reports quitting about a month ago denies alcohol.  Family history: CAD.   Review of Systems:  Performance Status (ECOG) 1   Review of Systems   As per HPI. Otherwise, 10 point system review was negative.   NURSING NOTES: **Vital Signs.:   22-Jul-14 17:32   Vital Signs Type: Blood Transfusion Complete   Temperature Temperature (F): 98.3   Celsius: 36.8   Temperature Source: oral   Pulse Pulse: 66   Respirations Respirations: 18   Systolic BP Systolic BP: 086   Diastolic BP (mmHg) Diastolic BP (mmHg): 67   Mean BP: 90   Pulse Ox % Pulse Ox %: 96   Oxygen Delivery: Room Air/ 21 %   Physical Exam:  Physical Exam General: Well-developed, well-nourished, no acute distress. Eyes: Pink conjunctiva, anicteric sclera. HEENT: Normocephalic, moist mucous membranes,  clear oropharnyx. Lungs: Clear to auscultation bilaterally. Heart: Regular rate and rhythm. No rubs, murmurs, or gallops. Abdomen: Soft, nontender, nondistended. No organomegaly noted, normoactive bowel sounds. Musculoskeletal: No edema, cyanosis, or clubbing. Neuro: Alert, answering all questions appropriately. Cranial nerves grossly intact. Skin: No rashes or petechiae noted. Psych: Normal affect. Lymphatics: No cervical, calvicular, axillary or inguinal LAD.    Codeine: N/V/Diarrhea  tetanus-diphth toxoids (Td) adult/adol: Swelling    ALPRAZolam 0.5 mg oral tablet: 1 tab(s) orally 3 times a day, As Needed, Status: Active, Quantity: 0, Refills: None   amLODIPine 10 mg oral tablet: 1 tab(s) orally once a day, Status: Active, Quantity: 0, Refills: None   desmopressin 0.2 mg oral tablet: 0.5 tab(s) orally once a day (at bedtime), Status: Active, Quantity: 0, Refills: None   levothyroxine 125 mcg (0.125 mg) oral capsule: 1 cap(s) orally once a day, Status: Active, Quantity: 0, Refills: None   lisinopril 10 mg oral tablet: 1 tab(s) orally once a day, Status: Active, Quantity: 0, Refills: None   pantoprazole 40 mg oral delayed release tablet: 1 tab(s) orally once a day, Status: Active, Quantity: 0, Refills: None   propranolol extended release 80 mg oral capsule, extended release: 1 cap(s) orally once a day, Status: Active, Quantity: 0, Refills: None  Laboratory Results:  Routine Chem:  22-Jul-14 04:36   Glucose, Serum 78  BUN 14  Creatinine (comp)  0.58  Sodium, Serum 139  Potassium, Serum 4.0  Chloride, Serum  108  CO2, Serum 27  Calcium (Total), Serum 8.6  Anion Gap  4  Osmolality (calc) 277  eGFR (African American) >  60  eGFR (Non-African American) >60 (eGFR values <109m/min/1.73 m2 may be an indication of chronic kidney disease (CKD). Calculated eGFR is useful in patients with stable renal function. The eGFR calculation will not be reliable in acutely ill patients when  serum creatinine is changing rapidly. It is not useful in  patients on dialysis. The eGFR calculation may not be applicable to patients at the low and high extremes of body sizes, pregnant women, and vegetarians.)  Routine Hem:  22-Jul-14 04:36   WBC (CBC)  3.5  RBC (CBC)  2.51  Hemoglobin (CBC)  7.1  Hematocrit (CBC)  21.3  Platelet Count (CBC) 166  MCV 85  MCH 28.3  MCHC 33.4  RDW  18.8  Neutrophil % 36.6  Lymphocyte % 26.1  Monocyte % 36.2  Eosinophil % 0.5  Basophil % 0.6  Neutrophil #  1.3  Lymphocyte #  0.9  Monocyte #  1.3  Eosinophil # 0.0  Basophil # 0.0 (Result(s) reported on 23 Feb 2013 at 06:47AM.)   Assessment and Plan: Impression:   Iron deficiency anemia. Plan:   1.  Iron deficiency anemia: Patient previously reported that she had a colonoscopy and endoscopy in July 2013 that was essentially within normal limits.  She is received 2 units of packed red blood cells with improvement of her hemoglobin.  She did not require any additional transfusions at this time, but will give 510 mg IV Feraheme today and then a 2nd dose in 3-4 days.  Full anemia workup has been initiated and is currently pending.  She does not require bone marrow biopsy at this time, but would consider one in the future if her symptoms do not improve. consult, will follow.  Electronic Signatures: FDelight Hoh(MD)  (Signed 22-Jul-14 17:42)  Authored: HISTORY OF PRESENT ILLNESS, PFSH, ROS, NURSING NOTES, PE, ALLERGIES, HOME MEDICATIONS, LABS, ASSESSMENT AND PLAN   Last Updated: 22-Jul-14 17:42 by FDelight Hoh(MD)

## 2014-12-01 ENCOUNTER — Other Ambulatory Visit: Payer: Self-pay | Admitting: *Deleted

## 2014-12-12 ENCOUNTER — Other Ambulatory Visit: Payer: Self-pay | Admitting: Internal Medicine

## 2014-12-12 NOTE — Telephone Encounter (Signed)
rx faxed

## 2014-12-12 NOTE — Telephone Encounter (Signed)
Last OV and refill 4.11.16.  Please advise refill

## 2014-12-14 ENCOUNTER — Ambulatory Visit (INDEPENDENT_AMBULATORY_CARE_PROVIDER_SITE_OTHER): Payer: Medicare Other | Admitting: Internal Medicine

## 2014-12-14 ENCOUNTER — Encounter: Payer: Self-pay | Admitting: Internal Medicine

## 2014-12-14 ENCOUNTER — Ambulatory Visit: Payer: Medicare Other | Admitting: Nurse Practitioner

## 2014-12-14 ENCOUNTER — Ambulatory Visit: Payer: Medicare Other | Admitting: Internal Medicine

## 2014-12-14 VITALS — BP 142/88 | HR 73 | Temp 97.9°F | Resp 16 | Ht 62.0 in | Wt 136.5 lb

## 2014-12-14 DIAGNOSIS — I10 Essential (primary) hypertension: Secondary | ICD-10-CM | POA: Diagnosis not present

## 2014-12-14 DIAGNOSIS — F411 Generalized anxiety disorder: Secondary | ICD-10-CM | POA: Diagnosis not present

## 2014-12-14 DIAGNOSIS — R682 Dry mouth, unspecified: Secondary | ICD-10-CM

## 2014-12-14 DIAGNOSIS — F32A Depression, unspecified: Secondary | ICD-10-CM

## 2014-12-14 DIAGNOSIS — F329 Major depressive disorder, single episode, unspecified: Secondary | ICD-10-CM | POA: Diagnosis not present

## 2014-12-14 DIAGNOSIS — K117 Disturbances of salivary secretion: Secondary | ICD-10-CM | POA: Diagnosis not present

## 2014-12-14 DIAGNOSIS — E785 Hyperlipidemia, unspecified: Secondary | ICD-10-CM

## 2014-12-14 MED ORDER — MIRTAZAPINE 7.5 MG PO TABS: 7.5000 mg | ORAL_TABLET | Freq: Every day | ORAL | Status: DC

## 2014-12-14 MED ORDER — LISINOPRIL 20 MG PO TABS: 20.0000 mg | ORAL_TABLET | Freq: Every day | ORAL | Status: DC

## 2014-12-14 NOTE — Patient Instructions (Addendum)
i  BELIEVE YOUR DRY MOUTH IS COMING FROM SLEEPING WITH YOUR MOUTH OPEN, BECAUSE YOU ARE CONGESTED   You can resume the Afrin spary for 5 days, but please resume the flonase (fluticasone) nasal spray immediately;   do not discontinue it.  This is treating your allergies.D  You can resume the gabapentin for your neuropathy  You can stop the cholesterol medication, and   We will recheck your cholesterol next time   I am starting you on a medicien dor depression called mirtazapine,  Take it at bedtime    I am increasing your lisinopril to 20 mg daily .  Our goal is BP below 130/70.  If this works, we may be able tget rid of your hydralazine

## 2014-12-14 NOTE — Progress Notes (Signed)
Patient ID: Caroline Nichols, female   DOB: 1940-08-26, 74 y.o.   MRN: [id number]   Patient Active Problem List   Diagnosis Date Noted  . Hyperlipidemia 12/17/2014  . Depression 12/14/2014  . Xerostomia 11/14/2014  . Hyperglycemia 06/12/2014  . Diabetes insipidus 06/12/2014  . S/P hysterectomy 06/09/2014  . Medication management 11/11/2013  . Atherosclerotic peripheral vascular disease with intermittent claudication 10/07/2013  . Edema of both legs 10/07/2013  . Venous stasis dermatitis 10/07/2013  . Chest pain 03/26/2013  . Left carotid bruit 03/26/2013  . Anemia of chronic disease 03/20/2013  . Polyarthralgia 03/20/2013  . IBS (irritable bowel syndrome)   . Degenerative arthritis of lumbar spine   . MURMUR 09/27/2009  . Postablative hypothyroidism 05/15/2009  . Anxiety state 05/15/2009  . Essential hypertension 05/15/2009    Subjective:  CC:   Chief Complaint  Patient presents with  . Follow-up    4 week dry mouth, patient wa advised by rheumatology to stop atorvastatin to see if legs feel better.    HPI:   Caroline Nichols is a 74 y.o. female who presents for follow up on chronic conditions including dry mouth,  Dry eyes, hypertension, and hyperlipidemia.  Her last visit was one month ago.   I advised a trial of gabpentin suspension  And ordered serologies for sjogren's  Syndrome that were negative. Symptoms persist. Sinuses congested.  Dd not contiue the flonase.  Finds herself getting overheated at night,  Then wakes up with some sinus congestion Which is chronic and unevaluated,    Using humidifier,  No significant change.  Thinks she may be mouth breathing..   Taking hydralazine 10 mg bid. bp  Has improved since stopping  DDAVP.  No change in urinary frequency     She has resumed Gabapentin since there was no change in dry mouth and her neuropathy became symptomatic.   Has been taking it for about 6 months    Anxiety.  Doesn't like the food at [facility],  Too starchy,   Dinner is served too late at times.  Frustrated,  Anxious,  Not sleeping well still lamenting the forced transition to assisted living  From her v beloved [place] are close by.           Past Medical History  Diagnosis Date  . Hypertension   . Hyperthyroidism     s/p radiation therapy, now hypothyroidism  . IBS (irritable bowel syndrome)   . Herniated disc   . Pancreatic cyst   . Arthritis     Possible RA - Follow up appt with Dr. Jefm Bryant  . H/O transfusion of packed red blood cells 02/2013    3 units PRBC for severe anemia 02/2013  . Anemia 02/2013    F/U with Dr. Grayland Ormond for Feraheme injections  . Hyperlipidemia   . Allergy   . History of kidney stones   . Migraine   . Cellulitis of arm, right   . Paroxysmal a-fib   . Diabetes insipidus     On desmopressin    Past Surgical History  Procedure Laterality Date  . Tubal ligation    . Cervical spine surgery      Bone fusion  . Hammer toe surgery    . Hernia repair    . Abdominal hysterectomy    . Breast surgery  1990s    Biopsy       The following portions of the patient's history were reviewed  and updated as appropriate: Allergies, current medications, and problem list.    Review of Systems:   Patient denies headache, fevers, malaise, unintentional weight loss, skin rash, eye pain, sinus congestion and sinus pain, sore throat, dysphagia,  hemoptysis , cough, dyspnea, wheezing, chest pain, palpitations, orthopnea, edema, abdominal pain, nausea, melena, diarrhea, constipation, flank pain, dysuria, hematuria, urinary  Frequency, nocturia, numbness, tingling, seizures,  Focal weakness, Loss of consciousness,  Tremor, insomnia, depression, anxiety, and suicidal ideation.     History   Social History  . Marital Status: Widowed    Spouse Name: N/A  . Number of Children: 4  . Years of Education: 12   Occupational History  . Textiles     Retired  . [employer]      Retired   Social History Main Topics  . Smoking status: Former Smoker -- 0.50 packs/day for 30 years    Types: Cigarettes    Quit date: 12/03/2012  . Smokeless tobacco: Never Used  . Alcohol Use: No  . Drug Use: No  . Sexual Activity: No   Other Topics Concern  . Not on file   Social History Narrative   Ms. Hogan was born and reared in [place]. She is a widow since 4. She was married for 48 years. They had 4 children (daughters). She is currently leaving at [facility] since June. 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 She enjoys shopping. She also enjoys music, word puzzles and she loves spending time with her family. She loves attending church. One of her favorite things is wearing hats. She absolutely loves hats!    Objective:  Filed Vitals:   12/14/14 1103  BP: 142/88  Pulse: 73  Temp: 97.9 F (36.6 C)  Resp: 16     General appearance: alert, cooperative and appears stated age Ears: normal TM's and external ear canals both ears Throat: lips, mucosa, and tongue normal; teeth and gums normal Neck: no adenopathy, no carotid bruit, supple, symmetrical, trachea midline and thyroid not enlarged, symmetric, no tenderness/mass/nodules Back: symmetric, no curvature. ROM normal. No CVA tenderness. Lungs: clear to auscultation bilaterally Heart: regular rate and rhythm, S1, S2 normal, no murmur, click, rub or gallop Abdomen: soft, non-tender; bowel sounds normal; no masses,  no organomegaly Pulses: 2+ and symmetric Skin: Skin color, texture, turgor normal. No rashes or lesions Lymph nodes: Cervical, supraclavicular, and axillary nodes normal.  Assessment and Plan:  Depression With weight ,oss,  Insomnia, decreased appetite and fatigue.  Trial of mirtazipine   Anxiety state With insomnia.  Prn alprazolam has been refilled.  The risks and benefits of benzodiazepine use were  discussed with patient today including excessive sedation leading to respiratory depression,  impaired thinking/driving, and addiction.  Patient was advised to avoid concurrent use with alcohol, to use medication only as needed and not to share with others  .    Xerostomia Appears to be djue to chronic sinus congestion and compensatory mouth breathing.  Resume flonase daily , ENT evaluation if no improvement.    Essential hypertension She has VI and the hydralazine is aggravating her edema.  Will increase the lisinopril so we can stop the hydralazine    Hyperlipidemia She has stopped taking atorvastatin due to intolerance. We will repeat her lipid panel in 6 weeks.  She has PAD and will be advised to resume some statin if tolerated. Continue daily ASA  Lab Results  Component Value Date   CHOL 203* 06/09/2014   HDL 52.70 06/09/2014   LDLCALC 128* 06/09/2014   TRIG 111.0 06/09/2014   CHOLHDL 4 06/09/2014     A total of 25 minutes of face to face time was spent with patient more than half of which was spent in counselling about the above mentioned conditions  and coordination of care   Updated Medication List Outpatient Encounter Prescriptions as of 12/14/2014  Medication Sig  . acetaminophen (TYLENOL) 325 MG tablet Take 2 tablets (650 mg total) by mouth every 6 (six) hours as needed for pain.  Marland Kitchen ALPRAZolam (XANAX) 0.5 MG tablet TAKE 1 TABLET THREE TIMES DAILY AS NEEDED FOR ANXIETY  . brimonidine (ALPHAGAN) 0.2 % ophthalmic solution Place 1 drop into both eyes 2 (two) times daily.  . Calcium-Vitamin D (SUPER CALCIUM/D) 600-125 MG-UNIT TABS Take by mouth. Taking 2 daily  . Cholecalciferol (D3-1000 PO) Take by mouth daily.  . ferrous sulfate 325 (65 FE) MG tablet Take 325 mg by mouth daily with breakfast.  . fluticasone (FLONASE) 50 MCG/ACT nasal spray Place 2 sprays into both nostrils daily.  . folic acid (FOLVITE) 1 MG tablet Take 1 mg by mouth daily.  Marland Kitchen glucose blood test strip One  Touch check sugar once daily. Dx: R73.9  . hydrALAZINE (APRESOLINE) 10 MG tablet Take 1 tablet (10 mg total) by mouth 2 (two) times daily.  . hydroxychloroquine (PLAQUENIL) 200 MG tablet Take 2 tablets (400 mg total) by mouth daily.  Marland Kitchen levothyroxine (SYNTHROID, LEVOTHROID) 112 MCG tablet TAKE 1 TABLET (112 MCG TOTAL) BY MOUTH DAILY BEFORE BREAKFAST.  Marland Kitchen lisinopril (PRINIVIL,ZESTRIL) 20 MG tablet Take 1 tablet (20 mg total) by mouth daily.  . nitroGLYCERIN (NITROSTAT) 0.4 MG SL tablet Place 1 tablet (0.4 mg total) under the tongue every 5 (five) minutes as needed.  . Omega-3 Fatty Acids (FISH OIL) 1000 MG CAPS Take by mouth daily.  . pantoprazole (PROTONIX) 40 MG tablet TAKE 1 TABLET (40 MG TOTAL) BY MOUTH DAILY.  . vitamin C (ASCORBIC ACID) 500 MG tablet Take 500 mg by mouth daily.  . [DISCONTINUED] lisinopril (PRINIVIL,ZESTRIL) 10 MG tablet Take 1 tablet (10 mg total) by mouth daily.  Marland Kitchen atorvastatin (LIPITOR) 20 MG tablet Take 1 tablet (20 mg total) by mouth daily. (Patient not taking: Reported on 12/14/2014)  . gabapentin (NEURONTIN) 300 MG capsule TAKE 1 CAPSULE (300 MG TOTAL) BY MOUTH 3 (THREE) TIMES DAILY. (Patient not taking: Reported on 12/14/2014)  . mirtazapine (REMERON) 7.5 MG tablet Take 1 tablet (7.5 mg total) by mouth at bedtime.   No facility-administered encounter medications on file as of 12/14/2014.     No orders of the defined types were placed in this encounter.    Return in about 4 weeks (around 01/11/2015).

## 2014-12-14 NOTE — Progress Notes (Signed)
Pre-visit discussion using our clinic review tool. No additional management support is needed unless otherwise documented below in the visit note.  

## 2014-12-16 DIAGNOSIS — H4011X1 Primary open-angle glaucoma, mild stage: Secondary | ICD-10-CM | POA: Diagnosis not present

## 2014-12-16 DIAGNOSIS — H2513 Age-related nuclear cataract, bilateral: Secondary | ICD-10-CM | POA: Diagnosis not present

## 2014-12-17 ENCOUNTER — Encounter: Payer: Self-pay | Admitting: Internal Medicine

## 2014-12-17 DIAGNOSIS — E785 Hyperlipidemia, unspecified: Secondary | ICD-10-CM | POA: Insufficient documentation

## 2014-12-17 NOTE — Assessment & Plan Note (Signed)
She has VI and the hydralazine is aggravating her edema.  Will increase the lisinopril so we can stop the hydralazine

## 2014-12-17 NOTE — Assessment & Plan Note (Signed)
Appears to be djue to chronic sinus congestion and compensatory mouth breathing.  Resume flonase daily , ENT evaluation if no improvement.

## 2014-12-17 NOTE — Assessment & Plan Note (Signed)
With insomnia.  Prn alprazolam has been refilled.  The risks and benefits of benzodiazepine use were discussed with patient today including excessive sedation leading to respiratory depression,  impaired thinking/driving, and addiction.  Patient was advised to avoid concurrent use with alcohol, to use medication only as needed and not to share with others  .

## 2014-12-17 NOTE — Assessment & Plan Note (Signed)
With weight ,oss,  Insomnia, decreased appetite and fatigue.  Trial of mirtazipine

## 2014-12-17 NOTE — Assessment & Plan Note (Signed)
She has stopped taking atorvastatin due to intolerance. We will repeat her lipid panel in 6 weeks.  She has PAD and will be advised to resume some statin if tolerated. Continue daily ASA   Lab Results  Component Value Date   CHOL 203* 06/09/2014   HDL 52.70 06/09/2014   LDLCALC 128* 06/09/2014   TRIG 111.0 06/09/2014   CHOLHDL 4 06/09/2014

## 2014-12-20 ENCOUNTER — Ambulatory Visit (INDEPENDENT_AMBULATORY_CARE_PROVIDER_SITE_OTHER): Payer: Medicare Other | Admitting: Endocrinology

## 2014-12-20 ENCOUNTER — Encounter: Payer: Self-pay | Admitting: Endocrinology

## 2014-12-20 VITALS — BP 136/82 | HR 73 | Resp 14 | Ht 62.0 in | Wt 139.0 lb

## 2014-12-20 DIAGNOSIS — E232 Diabetes insipidus: Secondary | ICD-10-CM | POA: Diagnosis not present

## 2014-12-20 DIAGNOSIS — E890 Postprocedural hypothyroidism: Secondary | ICD-10-CM | POA: Diagnosis not present

## 2014-12-20 LAB — BASIC METABOLIC PANEL
BUN: 19 mg/dL (ref 6–23)
CHLORIDE: 104 meq/L (ref 96–112)
CO2: 27 meq/L (ref 19–32)
CREATININE: 0.74 mg/dL (ref 0.40–1.20)
Calcium: 9.9 mg/dL (ref 8.4–10.5)
GFR: 98.81 mL/min (ref >60.00)
Glucose, Bld: 77 mg/dL (ref 70–99)
POTASSIUM: 4.2 meq/L (ref 3.5–5.1)
Sodium: 139 mEq/L (ref 135–145)

## 2014-12-20 LAB — TSH: TSH: 0.77 u[IU]/mL (ref 0.35–4.50)

## 2014-12-20 LAB — T4, FREE: Free T4: 1.38 ng/dL (ref 0.60–1.60)

## 2014-12-20 NOTE — Assessment & Plan Note (Addendum)
Based on testing done so far, it doesn't seem that she has DI ( refer to A/P from March note and problem overview). Will assess her labs today.  Feel that her dry mouth could be related to her mouth breathing and she is working with Dr Derrel Nip for this. Suggested that she use her humidifier by placing it closer to bed, apply vaseline on lips and nares. Also, she could try sucking on lemon hard candy prior to bedtime to see if this stimulates saliva production.  She is drinking coffee in the morning and I have asked her to avoid that for the next week, to see if morning time urinary frequency improves.

## 2014-12-20 NOTE — Patient Instructions (Signed)
Labs today. Try sucking on lemon flavored hard candy at night prior to sleep. Move humidifier closer to bed Avoid coffee for the next week to see if urinary frequency improves.   Please come back for a follow-up appointment in 2 months.

## 2014-12-20 NOTE — Progress Notes (Signed)
HPI  Caroline Nichols is a 74 y.o.-year-old female, returning for f/u for  DI and hypothyroidism. Last visit with  me 2 month ago.   Diabetes Insipidus-  Patient reports getting dxed with this about 3-4 years ago, when she was sick with inflammtory polyarthritis. She was diagnosed with partial DI based on hx of polyuria and increased thirst ( Na was normal) by Dr Dorris Fetch In [place], [place]. Started on DDAVP 0.1 mg qhs and had been on this since then. Polyuria was initially controlled somewhat, now she she reports that she is going about 15 times during the day, out of which about 5-6 times are at night ( between 3-8am). Feels exhausted in the morning due to lack of sleep and wakes up with dry mouth and headaches. Urine volume is small but urge is present and this makes her go to the bathroom more frequently. Drinks about 64- 70 oz water daily.    Tries to limit water intake after 5-7 pm.   Then she was seen by [facility] endocrine , but evaluation could not be completed ( Dr Rock Nephew). Etiology remains unclear.Their notes were reviewed. Pituitary hormones (PRL, IGF-1, cortisol were all normal in 2014).  Reports that she was seen at [facility] as well for having a water deprivation test, however was told there that she didn't need the DDAVP a while back.    Interim HPI-  Pt reports on and off Headaches since posterior  head injury ( jar of popcorn fell off the top shelf at [facility]) ~2013. Headaches are likely related to sinuses in her opinion No change in peripheral vision Gone up half size on shoes since 2013, no change in ring size Menopause 38 years ago when she had partial hysterectomy. No breast discharge No interfering medications or CKD  March 2016- she had a modified water deprivation test. Some missing data, but Serum Na stayed stable through the test and there was no polyuria and urine SG was at maximum ( whenever she was able to give [place] a sample). Weight largely stable. See problem overview for the results  of the test in detail.   She has held her DDAVP through the past 2 months. Cant tell a real difference in her urinary pattern off and on DDAVP. Weight stable at home. No increase in HAs, N/V. Contd dry mouth symptoms. Drinking 1.5 cups of coffee in the morning.  No recent change in meds. Taking off gabapentin for a trial didn't change her symptoms of dry mouth. Working diagnosis of cause of dry mouth is secondary to her mouth breathing. Now trying Afrin nasal spray and Flonase for this. Not tolerating nasal spray well and going to talk to Dr Derrel Nip about this.   Reviewed recent labs  Lab Results  Component Value Date   NA 139 11/10/2014   NA CANCELED 11/04/2014   NA CANCELED 11/04/2014   NA 141 10/25/2014   NA 138 10/21/2014   Lab Results  Component Value Date   GFR 84.24 11/10/2014   GFR 86.60 10/21/2014   GFR 83.12 10/12/2014   CREATININE 0.85 11/10/2014   CREATININE CANCELED 11/04/2014   CREATININE 0.83 10/21/2014   Lab Results  Component Value Date   LABOSMO 293 11/10/2014   LABOSMO 305* 11/04/2014   LABOSMO 300 10/25/2014   Lab Results  Component Value Date   OSMOLALUR 293* 11/10/2014   OSMOLALUR 405 10/25/2014   OSMOLALUR 561 10/21/2014   Lab Results  Component Value Date   HGBA1C 5.3 06/09/2014  HGBA1C 5.2 12/27/2012   Lab Results  Component Value Date   CALCIUM 9.4 11/10/2014   Hypothyroidism- Reviewed the hx: Pt. has been dx with hyperthyroidism and "inverted goiter" (?) in 1980s >> Dr Severe has sent her for RAI ablation >> postablative hypothyroidism >> started on Levothyroxine.  Since May 2015, she has been taking levothyroxine 112 mcg daily. She had variability in the TFTs in the past.  Taking her levothyroxine correctly and not missed any doses recently.   Review of systems: [ x ] complains of '[ ]'$  denies '[ ]'$  weight gain '[ ]'$  constipation [x ] fatigue-several months, feels that inflammatory arthritis contributing '[ ]'$  dry skin [ x ]  cold intolerance '[ ]'$  hair changes '[ ]'$  noticing any enlargement in size of thyroid '[ ]'$  lumps in neck '[ ]'$  dysphagia '[ ]'$  change in voice. '[ ]'$  weight loss '[ ]'$  tremors [ x ] palpitations-fleeting after taking LT4 daily and occasionally during the night '[ ]'$  diarrhea [x ] heat intolerance '[ ]'$  fatigue [ x] insomnia  Lab Results  Component Value Date   TSH 0.600 08/23/2014   TSH 2.07 12/22/2013   TSH 1.00 08/18/2013   Lab Results  Component Value Date   FREET4 1.40 08/23/2014   FREET4 1.04 12/22/2013   FREET4 1.19 08/18/2013     PE: BP 136/82 mmHg  Pulse 73  Resp 14  Ht '5\' 2"'$  (1.575 m)  Wt 139 lb (63.05 kg)  BMI 25.42 kg/m2  SpO2 98% Wt Readings from Last 3 Encounters:  12/20/14 139 lb (63.05 kg)  12/14/14 136 lb 8 oz (61.916 kg)  11/14/14 138 lb 4 oz (62.71 kg)   HEENT: [place]/AT, EOMI, no icterus, no proptosis, no chemosis, no mild lid lag, no retraction, eyes close completely, gross normal VF testing on confrontation Neck: thyroid gland - smooth, non-tender, no erythema, no tracheal deviation; negative Pemberton's sign; no lymphadenopathy; no bruits Lungs: good air entry, clear bilaterally Heart: S1&S2 normal, regular rate & rhythm; +Systolic murmurs, rubs or gallops Ext: no tremor in hands bilaterally, no edema, 2+ DP/PT pulses, good muscle mass  ASSESSMENT:   PLAN:  Problem List Items Addressed This Visit      Endocrine   Postablative hypothyroidism    She appears clinically euthyroid today without evidence of goiter/nodules.   Continue current dose. Hypothyroidism is controlled and not a cause of sodium imbalance plus recent Na normal. Update thyroid labs today to assess dose.           Relevant Orders   Basic metabolic panel   Osmolality   Osmolality, urine   TSH   T4, free   Diabetes insipidus - Primary    Based on testing done so far, it doesn't seem that she has DI ( refer to A/P from March note and problem overview). Will assess her labs today.   Feel that her dry mouth could be related to her mouth breathing and she is working with Dr Derrel Nip for this. Suggested that she use her humidifier by placing it closer to bed, apply vaseline on lips and nares. Also, she could try sucking on lemon hard candy prior to bedtime to see if this stimulates saliva production.  She is drinking coffee in the morning and I have asked her to avoid that for the next week, to see if morning time urinary frequency improves.               Relevant Orders   Basic metabolic panel  Osmolality   Osmolality, urine   TSH   T4, free       Follow back in 2 month  Alban Marucci [facility] 12/20/2014 12:04 PM

## 2014-12-20 NOTE — Progress Notes (Signed)
Pre visit review using our clinic review tool, if applicable. No additional management support is needed unless otherwise documented below in the visit note. 

## 2014-12-20 NOTE — Assessment & Plan Note (Signed)
She appears clinically euthyroid today without evidence of goiter/nodules.   Continue current dose. Hypothyroidism is controlled and not a cause of sodium imbalance plus recent Na normal. Update thyroid labs today to assess dose.

## 2014-12-21 LAB — OSMOLALITY: Osmolality: 290 mOsm/kg (ref 275–300)

## 2014-12-21 LAB — OSMOLALITY, URINE: OSMOLALITY UR: 134 mosm/kg — AB (ref 390–1090)

## 2014-12-28 DIAGNOSIS — M1990 Unspecified osteoarthritis, unspecified site: Secondary | ICD-10-CM | POA: Diagnosis not present

## 2015-01-03 DIAGNOSIS — G629 Polyneuropathy, unspecified: Secondary | ICD-10-CM | POA: Diagnosis not present

## 2015-01-03 DIAGNOSIS — R748 Abnormal levels of other serum enzymes: Secondary | ICD-10-CM | POA: Diagnosis not present

## 2015-01-03 DIAGNOSIS — M1990 Unspecified osteoarthritis, unspecified site: Secondary | ICD-10-CM | POA: Diagnosis not present

## 2015-01-11 ENCOUNTER — Ambulatory Visit (INDEPENDENT_AMBULATORY_CARE_PROVIDER_SITE_OTHER): Payer: Medicare Other | Admitting: Internal Medicine

## 2015-01-11 ENCOUNTER — Telehealth: Payer: Self-pay | Admitting: *Deleted

## 2015-01-11 ENCOUNTER — Other Ambulatory Visit (INDEPENDENT_AMBULATORY_CARE_PROVIDER_SITE_OTHER): Payer: Medicare Other

## 2015-01-11 ENCOUNTER — Encounter: Payer: Self-pay | Admitting: Internal Medicine

## 2015-01-11 VITALS — BP 130/74 | HR 77 | Temp 97.7°F | Resp 14 | Ht 62.0 in | Wt 134.4 lb

## 2015-01-11 DIAGNOSIS — F329 Major depressive disorder, single episode, unspecified: Secondary | ICD-10-CM | POA: Diagnosis not present

## 2015-01-11 DIAGNOSIS — E785 Hyperlipidemia, unspecified: Secondary | ICD-10-CM

## 2015-01-11 DIAGNOSIS — I10 Essential (primary) hypertension: Secondary | ICD-10-CM | POA: Diagnosis not present

## 2015-01-11 DIAGNOSIS — R682 Dry mouth, unspecified: Secondary | ICD-10-CM

## 2015-01-11 DIAGNOSIS — F32A Depression, unspecified: Secondary | ICD-10-CM

## 2015-01-11 DIAGNOSIS — K117 Disturbances of salivary secretion: Secondary | ICD-10-CM

## 2015-01-11 DIAGNOSIS — F411 Generalized anxiety disorder: Secondary | ICD-10-CM

## 2015-01-11 DIAGNOSIS — Z79899 Other long term (current) drug therapy: Secondary | ICD-10-CM | POA: Diagnosis not present

## 2015-01-11 DIAGNOSIS — E559 Vitamin D deficiency, unspecified: Secondary | ICD-10-CM

## 2015-01-11 LAB — LIPID PANEL
Cholesterol: 167 mg/dL (ref 0–200)
HDL: 54.5 mg/dL (ref >39.00)
LDL Cholesterol: 98 mg/dL (ref 0–99)
NonHDL: 112.5
TRIGLYCERIDES: 72 mg/dL (ref 0.0–149.0)
Total CHOL/HDL Ratio: 3
VLDL: 14.4 mg/dL (ref 0.0–40.0)

## 2015-01-11 LAB — VITAMIN D 25 HYDROXY (VIT D DEFICIENCY, FRACTURES): VITD: 26.24 ng/mL — AB (ref 30.00–100.00)

## 2015-01-11 MED ORDER — MIRTAZAPINE 15 MG PO TABS: 7.5000 mg | ORAL_TABLET | Freq: Every day | ORAL | Status: DC

## 2015-01-11 NOTE — Patient Instructions (Addendum)
I want you to reduce the evening dose of alprazolam to 1/2 tablet for a week.  After one week ,  Stop the evening dose completely  And take the mirtazapine instead at the new dose of 15 mg     Continue your bedtime dose of alprazolam ONLY   if you need it to fall asleep.  If you wake up early  You can take 1/2 alprazolam when you wake up early   You can use Milta Deiters Med's sinus rinse instead of Afrin at night if it is bothering your nose too much

## 2015-01-11 NOTE — Telephone Encounter (Signed)
I don't need any. thanks.

## 2015-01-11 NOTE — Progress Notes (Signed)
Subjective:  Patient ID: Caroline Nichols, female    DOB: 1941/06/26  Age: 74 y.o. MRN: [id number]  CC: The primary encounter diagnosis was Essential hypertension. Diagnoses of Xerostomia, Anxiety state, Depression, and Hyperlipidemia were also pertinent to this visit.  HPI Caroline Nichols presents for follow up on depression with weight loss.  She reports that the trial of low dose  remeron has not helped with the early am insomnia.  Wakes up early and feels hung over the rest of the day .   Hyperlipidemia:  She stopped her statin at last visit due to leg pain .  Repeat labs done today  Hypertension : at last visit there was a medication change , hydralazine stopped and lisinopril increased  , home bps have been averaging 132/74.  Today is elevated bc she did not take her medications because she fasted.   Chronic sinus congestion: causing dry mouth.  She reports that the topical Afl but has een cuasing recurrent numbness of her nose which lasts several hours.     Outpatient Prescriptions Prior to Visit  Medication Sig Dispense Refill  . acetaminophen (TYLENOL) 325 MG tablet Take 2 tablets (650 mg total) by mouth every 6 (six) hours as needed for pain.    Marland Kitchen ALPRAZolam (XANAX) 0.5 MG tablet TAKE 1 TABLET THREE TIMES DAILY AS NEEDED FOR ANXIETY 90 tablet 2  . atorvastatin (LIPITOR) 20 MG tablet Take 1 tablet (20 mg total) by mouth daily. (Patient not taking: Reported on 01/11/2015) 90 tablet 1  . brimonidine (ALPHAGAN) 0.2 % ophthalmic solution Place 1 drop into both eyes 2 (two) times daily.    . Calcium-Vitamin D (SUPER CALCIUM/D) 600-125 MG-UNIT TABS Take by mouth. Taking 2 daily    . Cholecalciferol (D3-1000 PO) Take by mouth daily.    . ferrous sulfate 325 (65 FE) MG tablet Take 325 mg by mouth daily with breakfast.    . fluticasone (FLONASE) 50 MCG/ACT nasal spray Place 2 sprays into both nostrils daily. 16 g 6  . folic acid (FOLVITE) 1 MG tablet Take 1 mg by mouth daily.    Marland Kitchen gabapentin  (NEURONTIN) 300 MG capsule TAKE 1 CAPSULE (300 MG TOTAL) BY MOUTH 3 (THREE) TIMES DAILY. 270 capsule 1  . glucose blood test strip One Touch check sugar once daily. Dx: R73.9 100 each 3  . hydrALAZINE (APRESOLINE) 10 MG tablet Take 1 tablet (10 mg total) by mouth 2 (two) times daily. (Patient not taking: Reported on 01/11/2015) 60 tablet 5  . hydroxychloroquine (PLAQUENIL) 200 MG tablet Take 2 tablets (400 mg total) by mouth daily. 60 tablet 6  . levothyroxine (SYNTHROID, LEVOTHROID) 112 MCG tablet TAKE 1 TABLET (112 MCG TOTAL) BY MOUTH DAILY BEFORE BREAKFAST. 90 tablet 1  . lisinopril (PRINIVIL,ZESTRIL) 20 MG tablet Take 1 tablet (20 mg total) by mouth daily. 30 tablet 2  . nitroGLYCERIN (NITROSTAT) 0.4 MG SL tablet Place 1 tablet (0.4 mg total) under the tongue every 5 (five) minutes as needed. 25 tablet 3  . Omega-3 Fatty Acids (FISH OIL) 1000 MG CAPS Take by mouth daily.    . pantoprazole (PROTONIX) 40 MG tablet TAKE 1 TABLET (40 MG TOTAL) BY MOUTH DAILY. 30 tablet 5  . vitamin C (ASCORBIC ACID) 500 MG tablet Take 500 mg by mouth daily.    . mirtazapine (REMERON) 7.5 MG tablet Take 1 tablet (7.5 mg total) by mouth at bedtime. 30 tablet 2   No facility-administered medications prior to visit.  Review of Systems;  Patient denies headache, fevers, malaise, unintentional weight loss, skin rash, eye pain, sinus congestion and sinus pain, sore throat, dysphagia,  hemoptysis , cough, dyspnea, wheezing, chest pain, palpitations, orthopnea, edema, abdominal pain, nausea, melena, diarrhea, constipation, flank pain, dysuria, hematuria, urinary  Frequency, nocturia, numbness, tingling, seizures,  Focal weakness, Loss of consciousness,  Tremor, insomnia, depression, anxiety, and suicidal ideation.      Objective:  BP 130/74 mmHg  Pulse 77  Temp(Src) 97.7 F (36.5 C)  Resp 14  Ht '5\' 2"'$  (1.575 m)  Wt 134 lb 6.4 oz (60.963 kg)  BMI 24.58 kg/m2  SpO2 95%  BP Readings from Last 3 Encounters:    01/11/15 130/74  12/20/14 136/82  12/14/14 142/88    Wt Readings from Last 3 Encounters:  01/11/15 134 lb 6.4 oz (60.963 kg)  12/20/14 139 lb (63.05 kg)  12/14/14 136 lb 8 oz (61.916 kg)    General appearance: alert, cooperative and appears stated age Ears: normal TM's and external ear canals both ears Throat: lips, mucosa, and tongue normal; teeth and gums normal Neck: no adenopathy, no carotid bruit, supple, symmetrical, trachea midline and thyroid not enlarged, symmetric, no tenderness/mass/nodules Back: symmetric, no curvature. ROM normal. No CVA tenderness. Lungs: clear to auscultation bilaterally Heart: regular rate and rhythm, S1, S2 normal, no murmur, click, rub or gallop Abdomen: soft, non-tender; bowel sounds normal; no masses,  no organomegaly Pulses: 2+ and symmetric Skin: Skin color, texture, turgor normal. No rashes or lesions Lymph nodes: Cervical, supraclavicular, and axillary nodes normal.  Lab Results  Component Value Date   HGBA1C 5.3 06/09/2014   HGBA1C 5.2 12/27/2012    Lab Results  Component Value Date   CREATININE 0.74 12/20/2014   CREATININE 0.85 11/10/2014   CREATININE CANCELED 11/04/2014    Lab Results  Component Value Date   WBC 6.8 07/18/2014   HGB 11.9* 07/18/2014   HCT 35.4 07/18/2014   PLT 117* 07/18/2014   GLUCOSE 77 12/20/2014   CHOL 167 01/11/2015   TRIG 72.0 01/11/2015   HDL 54.50 01/11/2015   LDLCALC 98 01/11/2015   ALT 38 07/18/2014   AST 44* 07/18/2014   NA 139 12/20/2014   K 4.2 12/20/2014   CL 104 12/20/2014   CREATININE 0.74 12/20/2014   BUN 19 12/20/2014   CO2 27 12/20/2014   TSH 0.77 12/20/2014   HGBA1C 5.3 06/09/2014   MICROALBUR 0.7 06/09/2014    No results found.  Assessment & Plan:   Problem List Items Addressed This Visit    Anxiety state    With insomnia.  She has been using alprazolam three times daily .  The risks and benefits of benzodiazepine use were discussed with patient today including  excessive sedation leading to respiratory depression,  impaired thinking/driving, and addiction.  Patient was advised to reduce her use to twice daily starting with suspension of evening dose and use of 15 mg remeron.        Relevant Medications   mirtazapine (REMERON) 15 MG tablet   Essential hypertension - Primary    Well controlled on current regimen. Renal function stable, no changes today.  Lab Results  Component Value Date   CREATININE 0.74 12/20/2014   Lab Results  Component Value Date   NA 139 12/20/2014   K 4.2 12/20/2014   CL 104 12/20/2014   CO2 27 12/20/2014         Xerostomia    Appears to be due to mouth breathing at night  secondary to chronic sinus congestion.  Continue flonase and Afrin if she can tolerate it. If not, refer to ENT for sinus evaluation       Depression    Continue trial of remeron and increase dose to 15 mg       Relevant Medications   mirtazapine (REMERON) 15 MG tablet   Hyperlipidemia    His myalgias have improved with suspension of statin.  repeat lipids are normal, but she has PAD. (noncritical ABIs).  Will recommend daily use of aspirin.    Lab Results  Component Value Date   CHOL 167 01/11/2015   HDL 54.50 01/11/2015   LDLCALC 98 01/11/2015   TRIG 72.0 01/11/2015   CHOLHDL 3 01/11/2015           A total of 25 minutes of face to face time was spent with patient more than half of which was spent in counselling about the above mentioned conditions  and coordination of care   I have changed Ms. Atkins's mirtazapine. I am also having her maintain her Fish Oil, Calcium-Vitamin D, acetaminophen, vitamin C, nitroGLYCERIN, ferrous sulfate, Cholecalciferol (Z0-0923 PO), folic acid, gabapentin, atorvastatin, pantoprazole, hydrALAZINE, levothyroxine, hydroxychloroquine, brimonidine, glucose blood, fluticasone, ALPRAZolam, and lisinopril.  Meds ordered this encounter  Medications  . mirtazapine (REMERON) 15 MG tablet    Sig: Take 0.5  tablets (7.5 mg total) by mouth daily after supper.    Dispense:  30 tablet    Refill:  2    Medications Discontinued During This Encounter  Medication Reason  . mirtazapine (REMERON) 7.5 MG tablet Reorder    Follow-up: Return in about 3 months (around 04/13/2015).   [name], MD

## 2015-01-11 NOTE — Telephone Encounter (Signed)
Labs and dx?  

## 2015-01-11 NOTE — Progress Notes (Signed)
Pre visit review using our clinic review tool, if applicable. No additional management support is needed unless otherwise documented below in the visit note. 

## 2015-01-13 ENCOUNTER — Encounter: Payer: Self-pay | Admitting: Internal Medicine

## 2015-01-13 NOTE — Assessment & Plan Note (Signed)
Well controlled on current regimen. Renal function stable, no changes today.  Lab Results  Component Value Date   CREATININE 0.74 12/20/2014   Lab Results  Component Value Date   NA 139 12/20/2014   K 4.2 12/20/2014   CL 104 12/20/2014   CO2 27 12/20/2014

## 2015-01-13 NOTE — Assessment & Plan Note (Signed)
Appears to be due to mouth breathing at night secondary to chronic sinus congestion.  Continue flonase and Afrin if she can tolerate it. If not, refer to ENT for sinus evaluation

## 2015-01-13 NOTE — Assessment & Plan Note (Signed)
With insomnia.  She has been using alprazolam three times daily .  The risks and benefits of benzodiazepine use were discussed with patient today including excessive sedation leading to respiratory depression,  impaired thinking/driving, and addiction.  Patient was advised to reduce her use to twice daily starting with suspension of evening dose and use of 15 mg remeron.

## 2015-01-13 NOTE — Assessment & Plan Note (Addendum)
His myalgias have improved with suspension of statin.  repeat lipids are normal, but she has PAD. (noncritical ABIs).  Will recommend daily use of aspirin.    Lab Results  Component Value Date   CHOL 167 01/11/2015   HDL 54.50 01/11/2015   LDLCALC 98 01/11/2015   TRIG 72.0 01/11/2015   CHOLHDL 3 01/11/2015

## 2015-01-13 NOTE — Assessment & Plan Note (Signed)
Continue trial of remeron and increase dose to 15 mg

## 2015-01-16 ENCOUNTER — Encounter: Payer: Self-pay | Admitting: *Deleted

## 2015-01-17 DIAGNOSIS — B351 Tinea unguium: Secondary | ICD-10-CM | POA: Diagnosis not present

## 2015-01-17 DIAGNOSIS — M79675 Pain in left toe(s): Secondary | ICD-10-CM | POA: Diagnosis not present

## 2015-01-17 DIAGNOSIS — M79674 Pain in right toe(s): Secondary | ICD-10-CM | POA: Diagnosis not present

## 2015-01-24 ENCOUNTER — Telehealth: Payer: Self-pay | Admitting: Internal Medicine

## 2015-01-24 ENCOUNTER — Encounter: Payer: Self-pay | Admitting: Endocrinology

## 2015-01-24 ENCOUNTER — Ambulatory Visit (INDEPENDENT_AMBULATORY_CARE_PROVIDER_SITE_OTHER): Payer: Medicare Other | Admitting: Endocrinology

## 2015-01-24 VITALS — BP 140/76 | HR 73 | Resp 14 | Ht 62.0 in | Wt 135.4 lb

## 2015-01-24 DIAGNOSIS — Z1239 Encounter for other screening for malignant neoplasm of breast: Secondary | ICD-10-CM

## 2015-01-24 DIAGNOSIS — E232 Diabetes insipidus: Secondary | ICD-10-CM

## 2015-01-24 LAB — BASIC METABOLIC PANEL
BUN: 22 mg/dL (ref 6–23)
CALCIUM: 9.7 mg/dL (ref 8.4–10.5)
CO2: 28 mEq/L (ref 19–32)
Chloride: 102 mEq/L (ref 96–112)
Creatinine, Ser: 0.85 mg/dL (ref 0.40–1.20)
GFR: 84.19 mL/min (ref >60.00)
GLUCOSE: 71 mg/dL (ref 70–99)
Potassium: 3.9 mEq/L (ref 3.5–5.1)
Sodium: 139 mEq/L (ref 135–145)

## 2015-01-24 NOTE — Telephone Encounter (Signed)
I tried to order but will not let me sign the order please advise.

## 2015-01-24 NOTE — Patient Instructions (Addendum)
Keep monitoring your symptoms.  Report back if unusual headaches, confusion, rapid weight loss.  Can continue to stay off the DDAVP.   Please come back for a follow-up appointment in 4 months- Dr Derrel Nip

## 2015-01-24 NOTE — Telephone Encounter (Signed)
Done

## 2015-01-24 NOTE — Progress Notes (Signed)
HPI  ANJOLI DIEMER is a 74 y.o.-year-old female, returning for f/u for  DI and hypothyroidism. Last visit with  me May 2016.   Diabetes Insipidus-  Patient reports getting dxed with this about 3-4 years ago, when she was sick with inflammtory polyarthritis. She was diagnosed with partial DI based on hx of polyuria and increased thirst ( Na was normal) by Dr Dorris Fetch In [place], [place]. Started on DDAVP 0.1 mg qhs and had been on this since then. Polyuria was initially controlled somewhat, now she she reports that she is going about 15 times during the day, out of which about 5-6 times are at night ( between 3-8am). Feels exhausted in the morning due to lack of sleep and wakes up with dry mouth and headaches. Urine volume is small but urge is present and this makes her go to the bathroom more frequently. Drinks about 64- 70 oz water daily.    Tries to limit water intake after 5-7 pm.   Then she was seen by [facility] endocrine , but evaluation could not be completed ( Dr Rock Nephew). Etiology remains unclear.Their notes were reviewed. Pituitary hormones (PRL, IGF-1, cortisol were all normal in 2014).  Reports that she was seen at [facility] as well for having a water deprivation test, however was told there that she didn't need the DDAVP a while back.    Interim HPI-  Pt reports on and off Headaches since posterior  head injury ( jar of popcorn fell off the top shelf at [facility]) ~2013. Headaches are likely related to sinuses in her opinion No change in peripheral vision Gone up half size on shoes since 2013, no change in ring size Menopause 38 years ago when she had partial hysterectomy. No breast discharge No interfering medications or CKD  March 2016- she had a modified water deprivation test. Some missing data, but Serum Na stayed stable through the test and there was no polyuria and urine SG was at maximum ( whenever she was able to give [place] a sample). Weight largely stable. See problem overview for the results of  the test in detail.   She has held her DDAVP from March 2016. Cant tell a real difference in her urinary pattern off and on DDAVP. Weight stable at home. No increase in HAs, N/V. Contd dry mouth symptoms. Drinking 1.5 cups of coffee in the morning and doesn't find that it is related to urinary frequency.  No recent change in meds. Taking off gabapentin for a trial didn't change her symptoms of dry mouth. So back on it.  Now using a nasal spray with saline rather than Afrin- this is helping her Working diagnosis of cause of dry mouth is secondary to her mouth breathing.   Reviewed recent labs  Lab Results  Component Value Date   NA 139 12/20/2014   NA 139 11/10/2014   NA CANCELED 11/04/2014   NA CANCELED 11/04/2014   NA 141 10/25/2014   Lab Results  Component Value Date   GFR 98.81 12/20/2014   GFR 84.24 11/10/2014   GFR 86.60 10/21/2014   CREATININE 0.74 12/20/2014   CREATININE 0.85 11/10/2014   CREATININE CANCELED 11/04/2014   Lab Results  Component Value Date   LABOSMO 290 12/20/2014   LABOSMO 293 11/10/2014   LABOSMO 305* 11/04/2014   Lab Results  Component Value Date   OSMOLALUR 134* 12/20/2014   OSMOLALUR 293* 11/10/2014   OSMOLALUR 405 10/25/2014   Lab Results  Component Value Date   HGBA1C  5.3 06/09/2014   HGBA1C 5.2 12/27/2012   Lab Results  Component Value Date   CALCIUM 9.9 12/20/2014   Hypothyroidism- Reviewed the hx: Pt. has been dx with hyperthyroidism and "inverted goiter" (?) in 1980s >> Dr Severe has sent her for RAI ablation >> postablative hypothyroidism >> started on Levothyroxine.  Since May 2015, she has been taking levothyroxine 112 mcg daily. She had variability in the TFTs in the past.  Taking her levothyroxine correctly and not missed any doses recently.    Lab Results  Component Value Date   TSH 0.77 12/20/2014   TSH 0.600 08/23/2014   TSH 2.07 12/22/2013   Lab Results  Component Value Date   FREET4 1.38 12/20/2014   FREET4  1.40 08/23/2014   FREET4 1.04 12/22/2013     PE: BP 140/76 mmHg  Pulse 73  Resp 14  Ht '5\' 2"'$  (1.575 m)  Wt 135 lb 6.4 oz (61.417 kg)  BMI 24.76 kg/m2  SpO2 97% Wt Readings from Last 3 Encounters:  01/24/15 135 lb 6.4 oz (61.417 kg)  01/11/15 134 lb 6.4 oz (60.963 kg)  12/20/14 139 lb (63.05 kg)   HEENT: [place]/AT, EOMI, no icterus, no proptosis, no chemosis, no mild lid lag, no retraction, eyes close completely, gross normal VF testing on confrontation at last visit Neck: thyroid gland - smooth, non-tender, no erythema, no tracheal deviation; negative Pemberton's sign; no lymphadenopathy; no bruits Lungs: good air entry, clear bilaterally Heart: S1&S2 normal, regular rate & rhythm; +Systolic murmurs, rubs or gallops Ext: no tremor in hands bilaterally, no edema, 2+ DP/PT pulses, good muscle mass  ASSESSMENT:   PLAN:  Problem List Items Addressed This Visit      Endocrine   Diabetes insipidus - Primary    Based on testing done so far, it doesn't seem that she has DI ( refer to A/P from March note and problem overview). Will assess her labs today. She can continue to stay off the DDAVP and follow up with Dr Derrel Nip for treatment/evaluation of dry mouth.  Lemon candy helps some with dry mouth.                  Relevant Orders   Basic metabolic panel   Osmolality   Osmolality, urine       Follow back in 4 month . Explained that [place] am [place] out of [place], discussed follow up options and she has elected to follow back with Dr Derrel Nip.   Yahya Boldman [facility] 01/24/2015 9:49 AM

## 2015-01-24 NOTE — Assessment & Plan Note (Signed)
Based on testing done so far, it doesn't seem that she has DI ( refer to A/P from March note and problem overview). Will assess her labs today. She can continue to stay off the DDAVP and follow up with Dr Derrel Nip for treatment/evaluation of dry mouth.  Lemon candy helps some with dry mouth.

## 2015-01-24 NOTE — Progress Notes (Signed)
Pre visit review using our clinic review tool, if applicable. No additional management support is needed unless otherwise documented below in the visit note. 

## 2015-01-24 NOTE — Telephone Encounter (Signed)
Pt asked about getting a mammo at [facility]. No order in system.msn

## 2015-01-25 LAB — OSMOLALITY: Osmolality: 290 mOsm/kg (ref 275–300)

## 2015-01-25 LAB — OSMOLALITY, URINE: Osmolality, Ur: 249 mOsm/kg — ABNORMAL LOW (ref 390–1090)

## 2015-01-30 ENCOUNTER — Encounter: Payer: Self-pay | Admitting: *Deleted

## 2015-02-09 ENCOUNTER — Encounter: Payer: Self-pay | Admitting: Internal Medicine

## 2015-02-10 ENCOUNTER — Other Ambulatory Visit: Payer: Self-pay | Admitting: Nurse Practitioner

## 2015-02-14 ENCOUNTER — Other Ambulatory Visit: Payer: Self-pay | Admitting: Internal Medicine

## 2015-02-18 ENCOUNTER — Other Ambulatory Visit: Payer: Self-pay | Admitting: Nurse Practitioner

## 2015-02-20 ENCOUNTER — Other Ambulatory Visit: Payer: Self-pay

## 2015-02-20 MED ORDER — GABAPENTIN 300 MG PO CAPS: ORAL_CAPSULE | ORAL | Status: DC

## 2015-02-21 ENCOUNTER — Ambulatory Visit: Payer: Medicare Other | Admitting: Endocrinology

## 2015-03-03 ENCOUNTER — Other Ambulatory Visit: Payer: Self-pay | Admitting: Internal Medicine

## 2015-03-03 ENCOUNTER — Ambulatory Visit: Payer: Medicare Other

## 2015-03-03 ENCOUNTER — Ambulatory Visit
Admission: RE | Admit: 2015-03-03 | Discharge: 2015-03-03 | Disposition: A | Discharge status: Home / Self Care | Payer: Medicare Other | Source: Ambulatory Visit | Attending: Internal Medicine | Admitting: Internal Medicine

## 2015-03-03 DIAGNOSIS — Z1231 Encounter for screening mammogram for malignant neoplasm of breast: Secondary | ICD-10-CM | POA: Diagnosis not present

## 2015-03-03 DIAGNOSIS — Z1239 Encounter for other screening for malignant neoplasm of breast: Secondary | ICD-10-CM

## 2015-03-09 DIAGNOSIS — I10 Essential (primary) hypertension: Secondary | ICD-10-CM | POA: Diagnosis not present

## 2015-03-09 DIAGNOSIS — I872 Venous insufficiency (chronic) (peripheral): Secondary | ICD-10-CM | POA: Diagnosis not present

## 2015-03-09 DIAGNOSIS — E785 Hyperlipidemia, unspecified: Secondary | ICD-10-CM | POA: Diagnosis not present

## 2015-03-09 DIAGNOSIS — M79609 Pain in unspecified limb: Secondary | ICD-10-CM | POA: Diagnosis not present

## 2015-03-09 DIAGNOSIS — I70213 Atherosclerosis of native arteries of extremities with intermittent claudication, bilateral legs: Secondary | ICD-10-CM | POA: Diagnosis not present

## 2015-03-09 DIAGNOSIS — M1990 Unspecified osteoarthritis, unspecified site: Secondary | ICD-10-CM | POA: Diagnosis not present

## 2015-03-15 ENCOUNTER — Other Ambulatory Visit: Payer: Self-pay | Admitting: Nurse Practitioner

## 2015-03-15 NOTE — Telephone Encounter (Signed)
Last OV 6.8.16.  Please advise refill 

## 2015-03-16 NOTE — Telephone Encounter (Signed)
Ok to refill,  Authorized in epic 

## 2015-03-17 DIAGNOSIS — H2513 Age-related nuclear cataract, bilateral: Secondary | ICD-10-CM | POA: Diagnosis not present

## 2015-03-17 DIAGNOSIS — H4011X1 Primary open-angle glaucoma, mild stage: Secondary | ICD-10-CM | POA: Diagnosis not present

## 2015-03-20 ENCOUNTER — Other Ambulatory Visit: Payer: Self-pay | Admitting: Nurse Practitioner

## 2015-03-20 NOTE — Telephone Encounter (Signed)
Rx faxed by Juliann Pulse

## 2015-04-14 ENCOUNTER — Ambulatory Visit (INDEPENDENT_AMBULATORY_CARE_PROVIDER_SITE_OTHER): Payer: Medicare Other | Admitting: Internal Medicine

## 2015-04-14 ENCOUNTER — Encounter: Payer: Self-pay | Admitting: Internal Medicine

## 2015-04-14 VITALS — BP 122/78 | HR 71 | Temp 97.7°F | Resp 12 | Ht 62.0 in | Wt 132.5 lb

## 2015-04-14 DIAGNOSIS — E034 Atrophy of thyroid (acquired): Secondary | ICD-10-CM | POA: Diagnosis not present

## 2015-04-14 DIAGNOSIS — E870 Hyperosmolality and hypernatremia: Secondary | ICD-10-CM

## 2015-04-14 DIAGNOSIS — Z23 Encounter for immunization: Secondary | ICD-10-CM | POA: Diagnosis not present

## 2015-04-14 DIAGNOSIS — E038 Other specified hypothyroidism: Secondary | ICD-10-CM | POA: Diagnosis not present

## 2015-04-14 DIAGNOSIS — R351 Nocturia: Secondary | ICD-10-CM

## 2015-04-14 DIAGNOSIS — R682 Dry mouth, unspecified: Secondary | ICD-10-CM

## 2015-04-14 DIAGNOSIS — I10 Essential (primary) hypertension: Secondary | ICD-10-CM

## 2015-04-14 DIAGNOSIS — R350 Frequency of micturition: Secondary | ICD-10-CM | POA: Diagnosis not present

## 2015-04-14 DIAGNOSIS — R300 Dysuria: Secondary | ICD-10-CM | POA: Diagnosis not present

## 2015-04-14 DIAGNOSIS — E232 Diabetes insipidus: Secondary | ICD-10-CM

## 2015-04-14 DIAGNOSIS — F411 Generalized anxiety disorder: Secondary | ICD-10-CM

## 2015-04-14 DIAGNOSIS — K117 Disturbances of salivary secretion: Secondary | ICD-10-CM

## 2015-04-14 DIAGNOSIS — J329 Chronic sinusitis, unspecified: Secondary | ICD-10-CM

## 2015-04-14 DIAGNOSIS — R5383 Other fatigue: Secondary | ICD-10-CM

## 2015-04-14 DIAGNOSIS — E890 Postprocedural hypothyroidism: Secondary | ICD-10-CM

## 2015-04-14 LAB — POCT URINALYSIS DIPSTICK
Bilirubin, UA: NEGATIVE
GLUCOSE UA: NEGATIVE
KETONES UA: NEGATIVE
Leukocytes, UA: NEGATIVE
Nitrite, UA: NEGATIVE
Protein, UA: NEGATIVE
SPEC GRAV UA: 1.01
UROBILINOGEN UA: 0.2
pH, UA: 6

## 2015-04-14 LAB — COMPREHENSIVE METABOLIC PANEL
ALK PHOS: 54 U/L (ref 39–117)
ALT: 22 U/L (ref 0–35)
AST: 37 U/L (ref 0–37)
Albumin: 4.3 g/dL (ref 3.5–5.2)
BUN: 19 mg/dL (ref 6–23)
CHLORIDE: 104 meq/L (ref 96–112)
CO2: 30 mEq/L (ref 19–32)
Calcium: 9.7 mg/dL (ref 8.4–10.5)
Creatinine, Ser: 1.06 mg/dL (ref 0.40–1.20)
GFR: 65.21 mL/min (ref >60.00)
GLUCOSE: 65 mg/dL — AB (ref 70–99)
POTASSIUM: 3.8 meq/L (ref 3.5–5.1)
SODIUM: 141 meq/L (ref 135–145)
Total Bilirubin: 0.4 mg/dL (ref 0.2–1.2)
Total Protein: 7.5 g/dL (ref 6.0–8.3)

## 2015-04-14 LAB — CBC WITH DIFFERENTIAL/PLATELET
BASOS PCT: 0.5 % (ref 0.0–3.0)
Basophils Absolute: 0 10*3/uL (ref 0.0–0.1)
EOS PCT: 0.1 % (ref 0.0–5.0)
Eosinophils Absolute: 0 10*3/uL (ref 0.0–0.7)
HCT: 39.4 % (ref 36.0–46.0)
HEMOGLOBIN: 13.1 g/dL (ref 12.0–15.0)
LYMPHS ABS: 0.8 10*3/uL (ref 0.7–4.0)
Lymphocytes Relative: 39.5 % (ref 12.0–46.0)
MCHC: 33.3 g/dL (ref 30.0–36.0)
MCV: 90.4 fl (ref 78.0–100.0)
MONO ABS: 0.6 10*3/uL (ref 0.1–1.0)
MONOS PCT: 28.1 % — AB (ref 3.0–12.0)
NEUTROS PCT: 31.8 % — AB (ref 43.0–77.0)
Neutro Abs: 0.7 10*3/uL — ABNORMAL LOW (ref 1.4–7.7)
Platelets: 113 10*3/uL — ABNORMAL LOW (ref 150.0–400.0)
RBC: 4.36 Mil/uL (ref 3.87–5.11)
RDW: 16.1 % — AB (ref 11.5–15.5)
WBC: 2.1 10*3/uL — ABNORMAL LOW (ref 4.0–10.5)

## 2015-04-14 LAB — URINALYSIS, ROUTINE W REFLEX MICROSCOPIC
BILIRUBIN URINE: NEGATIVE
KETONES UR: NEGATIVE
LEUKOCYTES UA: NEGATIVE
NITRITE: NEGATIVE
Specific Gravity, Urine: 1.01 (ref 1.000–1.030)
TOTAL PROTEIN, URINE-UPE24: NEGATIVE
URINE GLUCOSE: NEGATIVE
UROBILINOGEN UA: 0.2 (ref 0.0–1.0)
pH: 6 (ref 5.0–8.0)

## 2015-04-14 LAB — TSH: TSH: 1.5 u[IU]/mL (ref 0.35–4.50)

## 2015-04-14 MED ORDER — NITROGLYCERIN 0.4 MG/SPRAY TL SOLN: 1.0000 | Status: DC | PRN

## 2015-04-14 NOTE — Progress Notes (Signed)
Subjective:  Patient ID: Caroline Nichols, female    DOB: 1940-12-18  Age: 74 y.o. MRN: [id number]  CC: The primary encounter diagnosis was Dysuria. Diagnoses of Urinary frequency, Other fatigue, Hypernatremia, Hypothyroidism due to acquired atrophy of thyroid, Chronic congestion of paranasal sinus, Encounter for immunization, Anxiety state, Xerostomia, Diabetes insipidus, Essential hypertension, Postablative hypothyroidism, and Nocturia were also pertinent to this visit.  HPI Caroline Nichols presents for follow up on hypothyroidism, hypernatremia, hypertension and depression   Chronic symptoms of allergic rhinitis involving sinus congestion inadequately managed with allerest   Insomnia and depression have improved, with remeron,  But she continues to require prn use of alprazolam at night, when she has woken up iwith "jitters" at night .  taking 15 mg mirtazaipine after dinner.    Urinary frequency disrupting sleep; averages 8 times between  3 am and 6 am ,  Voiding only small amounts of urine .  Chronic problem, but getting worse   Having dry mouth .  Still limiting liquids in the evening to just sips.   Wants flu shot  Discussed ENT and urology after a post void residual  And UA   Outpatient Prescriptions Prior to Visit  Medication Sig Dispense Refill  . acetaminophen (TYLENOL) 325 MG tablet Take 2 tablets (650 mg total) by mouth every 6 (six) hours as needed for pain.    Marland Kitchen ALPRAZolam (XANAX) 0.5 MG tablet TAKE 1 TABLET THREE TIMES DAILY AS NEEDED FOR ANXIETY 90 tablet 1  . brimonidine (ALPHAGAN) 0.2 % ophthalmic solution Place 1 drop into both eyes 2 (two) times daily.    . Calcium-Vitamin D (SUPER CALCIUM/D) 600-125 MG-UNIT TABS Take by mouth. Taking 2 daily    . Cholecalciferol (D3-1000 PO) Take by mouth daily.    . ferrous sulfate 325 (65 FE) MG tablet Take 325 mg by mouth daily with breakfast.    . fluticasone (FLONASE) 50 MCG/ACT nasal spray Place 2 sprays into both nostrils daily. 16  g 6  . folic acid (FOLVITE) 1 MG tablet Take 1 mg by mouth daily.    Marland Kitchen gabapentin (NEURONTIN) 300 MG capsule TAKE 1 CAPSULE (300 MG TOTAL) BY MOUTH 3 (THREE) TIMES DAILY. 270 capsule 1  . hydrALAZINE (APRESOLINE) 10 MG tablet Take 1 tablet (10 mg total) by mouth 2 (two) times daily. 60 tablet 5  . hydroxychloroquine (PLAQUENIL) 200 MG tablet Take 2 tablets (400 mg total) by mouth daily. 60 tablet 6  . levothyroxine (SYNTHROID, LEVOTHROID) 112 MCG tablet TAKE 1 TABLET (112 MCG TOTAL) BY MOUTH DAILY BEFORE BREAKFAST. 90 tablet 0  . lisinopril (PRINIVIL,ZESTRIL) 20 MG tablet TAKE 1 TABLET (20 MG TOTAL) BY MOUTH DAILY. 30 tablet 1  . mirtazapine (REMERON) 15 MG tablet Take 0.5 tablets (7.5 mg total) by mouth daily after supper. (Patient taking differently: Take 15 mg by mouth daily after supper. ) 30 tablet 2  . mirtazapine (REMERON) 7.5 MG tablet TAKE 1 TABLET (7.5 MG TOTAL) BY MOUTH AT BEDTIME. 30 tablet 1  . Omega-3 Fatty Acids (FISH OIL) 1000 MG CAPS Take by mouth daily.    . pantoprazole (PROTONIX) 40 MG tablet TAKE 1 TABLET (40 MG TOTAL) BY MOUTH DAILY. 30 tablet 4  . vitamin C (ASCORBIC ACID) 500 MG tablet Take 500 mg by mouth daily.    . nitroGLYCERIN (NITROSTAT) 0.4 MG SL tablet Place 1 tablet (0.4 mg total) under the tongue every 5 (five) minutes as needed. 25 tablet 3  . hydrALAZINE (APRESOLINE) 10 MG  tablet TAKE 1 TABLET (10 MG TOTAL) BY MOUTH 2 (TWO) TIMES DAILY. 60 tablet 4  . pantoprazole (PROTONIX) 40 MG tablet TAKE 1 TABLET (40 MG TOTAL) BY MOUTH DAILY. 30 tablet 4   No facility-administered medications prior to visit.    Review of Systems;  Patient denies headache, fevers, malaise, unintentional weight loss, skin rash, eye pain, sinus congestion and sinus pain, sore throat, dysphagia,  hemoptysis , cough, dyspnea, wheezing, chest pain, palpitations, orthopnea, edema, abdominal pain, nausea, melena, diarrhea, constipation, flank pain, dysuria, hematuria, urinary  Frequency,  nocturia, numbness, tingling, seizures,  Focal weakness, Loss of consciousness,  Tremor, insomnia, depression, anxiety, and suicidal ideation.      Objective:  BP 122/78 mmHg  Pulse 71  Temp(Src) 97.7 F (36.5 C) (Oral)  Resp 12  Ht '5\' 2"'$  (1.575 m)  Wt 132 lb 8 oz (60.102 kg)  BMI 24.23 kg/m2  SpO2 99%  BP Readings from Last 3 Encounters:  04/14/15 122/78  01/24/15 140/76  01/11/15 130/74    Wt Readings from Last 3 Encounters:  04/14/15 132 lb 8 oz (60.102 kg)  01/24/15 135 lb 6.4 oz (61.417 kg)  01/11/15 134 lb 6.4 oz (60.963 kg)    General appearance: alert, cooperative and appears stated age Ears: normal TM's and external ear canals both ears Throat: lips, mucosa, and tongue normal; teeth and gums normal Neck: no adenopathy, no carotid bruit, supple, symmetrical, trachea midline and thyroid not enlarged, symmetric, no tenderness/mass/nodules Back: symmetric, no curvature. ROM normal. No CVA tenderness. Lungs: clear to auscultation bilaterally Heart: regular rate and rhythm, S1, S2 normal, no murmur, click, rub or gallop Abdomen: soft, non-tender; bowel sounds normal; no masses,  no organomegaly Pulses: 2+ and symmetric Skin: Skin color, texture, turgor normal. No rashes or lesions Lymph nodes: Cervical, supraclavicular, and axillary nodes normal.  Lab Results  Component Value Date   HGBA1C 5.3 06/09/2014   HGBA1C 5.2 12/27/2012    Lab Results  Component Value Date   CREATININE 1.06 04/14/2015   CREATININE 0.85 01/24/2015   CREATININE 0.74 12/20/2014    Lab Results  Component Value Date   WBC * 04/14/2015    2.1 A Manual Differential was performed and is consistent with the Automated Differential.   HGB 13.1 04/14/2015   HCT 39.4 04/14/2015   PLT 113.0* 04/14/2015   GLUCOSE 65* 04/14/2015   CHOL 167 01/11/2015   TRIG 72.0 01/11/2015   HDL 54.50 01/11/2015   LDLCALC 98 01/11/2015   ALT 22 04/14/2015   AST 37 04/14/2015   NA 141 04/14/2015   K 3.8  04/14/2015   CL 104 04/14/2015   CREATININE 1.06 04/14/2015   BUN 19 04/14/2015   CO2 30 04/14/2015   TSH 1.50 04/14/2015   HGBA1C 5.3 06/09/2014   MICROALBUR 0.7 06/09/2014    Mm Digital Screening  03/03/2015   CLINICAL DATA:  Screening.  EXAM: DIGITAL SCREENING BILATERAL MAMMOGRAM WITH CAD  COMPARISON:  Previous exam(s).  ACR Breast Density Category c: The breast tissue is heterogeneously dense, which may obscure small masses.  FINDINGS: There are no findings suspicious for malignancy. Images were processed with CAD.  IMPRESSION: No mammographic evidence of malignancy. A result letter of this screening mammogram will be mailed directly to the patient.  RECOMMENDATION: Screening mammogram in one year. (Code:[id number])  BI-RADS CATEGORY  1: Negative.   Electronically Signed   By: Fidela Salisbury M.D.   On: 03/03/2015 14:17    Assessment & Plan:   Problem List  Items Addressed This Visit      Unprioritized   Postablative hypothyroidism    Thyroid function is WNL on current dose.  No current changes needed.   Lab Results  Component Value Date   TSH 1.50 04/14/2015              Anxiety state    With insomnia.  She has reduced her use to twice daily starting with suspension of evening dose and use of 15 mg remeron.          Essential hypertension    Well controlled on current regimen. Renal function stable, no changes today.  Lab Results  Component Value Date   CREATININE 1.06 04/14/2015         Relevant Medications   nitroGLYCERIN (NITROLINGUAL) 0.4 MG/SPRAY spray   Diabetes insipidus    Resolved vs misdiagnosed per Dr Bertram Gala evaluation. Despite frequent urination and suspension of DDAVP several months ago,  Her sodium level remains normal.  Lab Results  Component Value Date   NA 141 04/14/2015   K 3.8 04/14/2015   CL 104 04/14/2015   CO2 30 04/14/2015         Xerostomia    Appears to be djue to chronic sinus congestion and compensatory mouth  breathing.   No improvement after suspension of antihistamine so she has resumed Allerest for chronc allergic rhinitis.  ENT evaluation advised      Nocturia    Infection ruled out ,  Chronic but worsening.  Needs post void residual to r/o neurogenic baldder, followed by Urology referral        Other Visit Diagnoses    Dysuria    -  Primary    Relevant Orders    POCT Urinalysis Dipstick (Completed)    Urine Culture (Completed)    Urinalysis, Routine w reflex microscopic (Completed)    Urinary frequency        Relevant Orders    [place], retroperitnl abd,  ltd    Urinalysis, Routine w reflex microscopic (not at [facility])    Ambulatory referral to Urology    Other fatigue        Relevant Orders    CBC with Differential/Platelet (Completed)    Hypernatremia        Relevant Orders    Comprehensive metabolic panel (Completed)    Hypothyroidism due to acquired atrophy of thyroid        Relevant Orders    TSH (Completed)    Chronic congestion of paranasal sinus        Relevant Orders    Ambulatory referral to ENT    Encounter for immunization           I have changed Ms. Olvey's nitroGLYCERIN. I am also having her maintain her Fish Oil, Calcium-Vitamin D, acetaminophen, vitamin C, ferrous sulfate, Cholecalciferol (E9-9371 PO), folic acid, hydrALAZINE, hydroxychloroquine, brimonidine, fluticasone, mirtazapine, levothyroxine, lisinopril, mirtazapine, pantoprazole, gabapentin, and ALPRAZolam.  Meds ordered this encounter  Medications  . nitroGLYCERIN (NITROLINGUAL) 0.4 MG/SPRAY spray    Sig: Place 1 spray under the tongue every 5 (five) minutes x 3 doses as needed for chest pain.    Dispense:  4.9 g    Refill:  11    Medications Discontinued During This Encounter  Medication Reason  . hydrALAZINE (APRESOLINE) 10 MG tablet Duplicate  . pantoprazole (PROTONIX) 40 MG tablet Duplicate  . nitroGLYCERIN (NITROSTAT) 0.4 MG SL tablet Reorder    Follow-up: No Follow-up on file.   Elianna Windom,  Jarelis Ehlert L,  MD

## 2015-04-14 NOTE — Patient Instructions (Signed)
I am setting up a bladder ultrasound called a "Post void residual"   This measures the amount of urine left in your bladder after you have tried to empty it  Referrals to Urology and to Ear Nose and Throat are in process  You received the flu vaccine today  Labs ordered;  We will call you with the results

## 2015-04-14 NOTE — Progress Notes (Signed)
Pre-visit discussion using our clinic review tool. No additional management support is needed unless otherwise documented below in the visit note.  

## 2015-04-15 LAB — URINE CULTURE
Colony Count: NO GROWTH
Organism ID, Bacteria: NO GROWTH

## 2015-04-16 ENCOUNTER — Encounter: Payer: Self-pay | Admitting: Internal Medicine

## 2015-04-16 ENCOUNTER — Other Ambulatory Visit: Payer: Self-pay | Admitting: Internal Medicine

## 2015-04-16 DIAGNOSIS — D709 Neutropenia, unspecified: Secondary | ICD-10-CM

## 2015-04-16 DIAGNOSIS — R351 Nocturia: Secondary | ICD-10-CM | POA: Insufficient documentation

## 2015-04-16 NOTE — Assessment & Plan Note (Signed)
Appears to be djue to chronic sinus congestion and compensatory mouth breathing.   No improvement after suspension of antihistamine so she has resumed Allerest for chronc allergic rhinitis.  ENT evaluation advised

## 2015-04-16 NOTE — Assessment & Plan Note (Signed)
Infection ruled out ,  Chronic but worsening.  Needs post void residual to r/o neurogenic baldder, followed by Urology referral

## 2015-04-16 NOTE — Assessment & Plan Note (Signed)
Thyroid function is WNL on current dose.  No current changes needed.   Lab Results  Component Value Date   TSH 1.50 04/14/2015

## 2015-04-16 NOTE — Assessment & Plan Note (Signed)
With insomnia.  She has reduced her use to twice daily starting with suspension of evening dose and use of 15 mg remeron.

## 2015-04-16 NOTE — Assessment & Plan Note (Signed)
Well controlled on current regimen. Renal function stable, no changes today.  Lab Results  Component Value Date   CREATININE 1.06 04/14/2015

## 2015-04-16 NOTE — Assessment & Plan Note (Signed)
Resolved vs misdiagnosed per Dr Bertram Gala evaluation. Despite frequent urination and suspension of DDAVP several months ago,  Her sodium level remains normal.  Lab Results  Component Value Date   NA 141 04/14/2015   K 3.8 04/14/2015   CL 104 04/14/2015   CO2 30 04/14/2015

## 2015-04-18 ENCOUNTER — Telehealth: Payer: Self-pay | Admitting: *Deleted

## 2015-04-18 NOTE — Telephone Encounter (Signed)
Her sodium and thyroid levels are normal.  She does not have a UTI.   Her white count and platelet counts are still a little low.

## 2015-04-18 NOTE — Progress Notes (Unsigned)
Left message for patient to call office.  

## 2015-04-18 NOTE — Telephone Encounter (Signed)
Pt called requesting lab results from 9.9.16.  Please advise

## 2015-04-18 NOTE — Telephone Encounter (Signed)
Talked with patient and rescheduled lab /.

## 2015-04-18 NOTE — Telephone Encounter (Signed)
Patient has requested a call back after 2pm for lab results-thanks

## 2015-04-19 NOTE — Telephone Encounter (Signed)
Left message on VM to return call 

## 2015-04-19 NOTE — Telephone Encounter (Signed)
Spoke with pt, advised of lab results verbalized understanding

## 2015-04-25 DIAGNOSIS — J342 Deviated nasal septum: Secondary | ICD-10-CM | POA: Diagnosis not present

## 2015-04-25 DIAGNOSIS — J301 Allergic rhinitis due to pollen: Secondary | ICD-10-CM | POA: Diagnosis not present

## 2015-04-25 DIAGNOSIS — J305 Allergic rhinitis due to food: Secondary | ICD-10-CM | POA: Diagnosis not present

## 2015-05-10 ENCOUNTER — Ambulatory Visit (INDEPENDENT_AMBULATORY_CARE_PROVIDER_SITE_OTHER): Payer: Medicare Other | Admitting: Urology

## 2015-05-10 ENCOUNTER — Encounter: Payer: Self-pay | Admitting: Urology

## 2015-05-10 ENCOUNTER — Other Ambulatory Visit: Payer: Self-pay | Admitting: Internal Medicine

## 2015-05-10 VITALS — BP 129/80 | HR 71 | Ht 62.0 in | Wt 133.4 lb

## 2015-05-10 DIAGNOSIS — M1990 Unspecified osteoarthritis, unspecified site: Secondary | ICD-10-CM | POA: Insufficient documentation

## 2015-05-10 DIAGNOSIS — R350 Frequency of micturition: Secondary | ICD-10-CM | POA: Diagnosis not present

## 2015-05-10 DIAGNOSIS — N3281 Overactive bladder: Secondary | ICD-10-CM | POA: Diagnosis not present

## 2015-05-10 DIAGNOSIS — D649 Anemia, unspecified: Secondary | ICD-10-CM | POA: Insufficient documentation

## 2015-05-10 DIAGNOSIS — M2550 Pain in unspecified joint: Secondary | ICD-10-CM | POA: Insufficient documentation

## 2015-05-10 LAB — URINALYSIS, COMPLETE
Bilirubin, UA: NEGATIVE
GLUCOSE, UA: NEGATIVE
KETONES UA: NEGATIVE
LEUKOCYTES UA: NEGATIVE
Nitrite, UA: NEGATIVE
PROTEIN UA: NEGATIVE
SPEC GRAV UA: 1.025 (ref 1.005–1.030)
Urobilinogen, Ur: 0.2 mg/dL (ref 0.2–1.0)
pH, UA: 5.5 (ref 5.0–7.5)

## 2015-05-10 LAB — MICROSCOPIC EXAMINATION
Epithelial Cells (non renal): NONE SEEN /hpf (ref 0–10)
RBC, UA: NONE SEEN /hpf (ref 0–?)
Renal Epithel, UA: NONE SEEN /hpf

## 2015-05-10 LAB — BLADDER SCAN AMB NON-IMAGING: Scan Result: 49

## 2015-05-10 MED ORDER — SOLIFENACIN SUCCINATE 10 MG PO TABS: 10.0000 mg | ORAL_TABLET | Freq: Every day | ORAL | Status: DC

## 2015-05-10 NOTE — Progress Notes (Signed)
05/10/2015 10:37 AM   Caroline Nichols 08-20-1940 [id number]  Referring provider: [name], MD [address] [place], [place] [address]  Chief Complaint  Patient presents with  . Urinary Frequency    HPI: Patient has an 8 year history of incontinence is progressively getting worse. She has urge incontinence about every hour. For example she has voided 8 times between 3:30 this morning and now 10:30. She voids about every hour. She can barely sit to [employer]  and knows where every bathroom is in any store she ever visits to shop. She can travel about an hour in her automobile the good thing about her urgency is that she does not lose urine because she manages to get to a bathroom in time. She is seen to urologist in the past but each time she had seen a urologist or therapy was not done because of a other serious problems with rheumatoid arthritis. She's never been on any medication urgency. Her major physical problem is rheumatoid arthritis previous never had a stroke is not use caffeinated beverages to any excessive amount. She has one cup of week coffee every morning. Her urgency have predated.   PMH: Past Medical History  Diagnosis Date  . Hypertension   . Hyperthyroidism     s/p radiation therapy, now hypothyroidism  . IBS (irritable bowel syndrome)   . Herniated disc   . Pancreatic cyst   . Arthritis     Possible RA - Follow up appt with Dr. Jefm Bryant  . H/O transfusion of packed red blood cells 02/2013    3 units PRBC for severe anemia 02/2013  . Anemia 02/2013    F/U with Dr. Grayland Ormond for Feraheme injections  . Hyperlipidemia   . Allergy   . History of kidney stones   . Migraine   . Cellulitis of arm, right   . Paroxysmal a-fib ([place])   . Diabetes insipidus ([place])     On desmopressin    Surgical History: Past Surgical History  Procedure Laterality Date  . Tubal ligation    . Cervical spine surgery      Bone fusion  . Hammer toe surgery    .  Hernia repair    . Abdominal hysterectomy    . Breast surgery  1990s    Biopsy  . Breast excisional biopsy Right 2005    neg    Home Medications:    Medication List       This list is accurate as of: 05/10/15 10:37 AM.  Always use your most recent med list.               acetaminophen 325 MG tablet  Commonly known as:  TYLENOL  Take 2 tablets (650 mg total) by mouth every 6 (six) hours as needed for pain.     ALPRAZolam 0.5 MG tablet  Commonly known as:  XANAX  TAKE 1 TABLET THREE TIMES DAILY AS NEEDED FOR ANXIETY     brimonidine 0.2 % ophthalmic solution  Commonly known as:  ALPHAGAN  Place 1 drop into both eyes 2 (two) times daily.     D3-1000 PO  Take by mouth daily.     ferrous sulfate 325 (65 FE) MG tablet  Take 325 mg by mouth daily with breakfast.     Fish Oil 1000 MG Caps  Take by mouth daily.     fluticasone 50 MCG/ACT nasal spray  Commonly known as:  FLONASE  Place 2 sprays into both  nostrils daily.     folic acid 1 MG tablet  Commonly known as:  FOLVITE  Take 1 mg by mouth daily.     gabapentin 300 MG capsule  Commonly known as:  NEURONTIN  TAKE 1 CAPSULE (300 MG TOTAL) BY MOUTH 3 (THREE) TIMES DAILY.     hydrALAZINE 10 MG tablet  Commonly known as:  APRESOLINE  Take 1 tablet (10 mg total) by mouth 2 (two) times daily.     hydroxychloroquine 200 MG tablet  Commonly known as:  PLAQUENIL  Take 2 tablets (400 mg total) by mouth daily.     levothyroxine 112 MCG tablet  Commonly known as:  SYNTHROID, LEVOTHROID  TAKE 1 TABLET (112 MCG TOTAL) BY MOUTH DAILY BEFORE BREAKFAST.     lisinopril 20 MG tablet  Commonly known as:  PRINIVIL,ZESTRIL  TAKE 1 TABLET (20 MG TOTAL) BY MOUTH DAILY.     mirtazapine 15 MG tablet  Commonly known as:  REMERON  Take 0.5 tablets (7.5 mg total) by mouth daily after supper.     mirtazapine 7.5 MG tablet  Commonly known as:  REMERON  TAKE 1 TABLET (7.5 MG TOTAL) BY MOUTH AT BEDTIME.     nitroGLYCERIN 0.4  MG/SPRAY spray  Commonly known as:  NITROLINGUAL  Place 1 spray under the tongue every 5 (five) minutes x 3 doses as needed for chest pain.     pantoprazole 40 MG tablet  Commonly known as:  PROTONIX  TAKE 1 TABLET (40 MG TOTAL) BY MOUTH DAILY.     solifenacin 10 MG tablet  Commonly known as:  VESICARE  Take 1 tablet (10 mg total) by mouth daily.     SUPER CALCIUM/D 600-125 MG-UNIT Tabs  Generic drug:  Calcium-Vitamin D  Take by mouth. Taking 2 daily     vitamin C 500 MG tablet  Commonly known as:  ASCORBIC ACID  Take 500 mg by mouth daily.        Allergies:  Allergies  Allergen Reactions  . Codeine     REACTION: NAUSEA  . Tetanus Toxoid     REACTION: ARM SWELLING,REDNESS  . Tramadol Nausea And Vomiting    Family History: Family History  Problem Relation Age of Onset  . Heart disease Mother   . Heart disease Father   . Diabetes Brother   . Kidney disease Brother   . Stroke Daughter     due to medication reaction  . Prostate cancer Neg Hx   . Hematuria Neg Hx     Social History:  reports that she quit smoking about 2 years ago. Her smoking use included Cigarettes. She has a 15 pack-year smoking history. She has never used smokeless tobacco. She reports that she does not drink alcohol or use illicit drugs.  ROS: UROLOGY Frequent Urination?: Yes Hard to postpone urination?: Yes Burning/pain with urination?: Yes Get up at night to urinate?: Yes Leakage of urine?: No Urine stream starts and stops?: No Trouble starting stream?: Yes Do you have to strain to urinate?: No Blood in urine?: No Urinary tract infection?: No Sexually transmitted disease?: No Injury to kidneys or bladder?: No Painful intercourse?: No Weak stream?: Yes Currently pregnant?: No Vaginal bleeding?: No Last menstrual period?: No  Gastrointestinal Nausea?: Yes Vomiting?: No Indigestion/heartburn?: Yes Diarrhea?: Yes Constipation?: No  Constitutional Fever: No Night sweats?:  Yes Weight loss?: No Fatigue?: Yes  Skin Skin rash/lesions?: Yes Itching?: Yes  Eyes Blurred vision?: No Double vision?: No  Ears/Nose/Throat Sore throat?: Yes Sinus  problems?: Yes  Hematologic/Lymphatic Swollen glands?: No Easy bruising?: Yes  Cardiovascular Leg swelling?: Yes Chest pain?: No  Respiratory Cough?: No Shortness of breath?: Yes  Endocrine Excessive thirst?: Yes  Musculoskeletal Back pain?: Yes Joint pain?: Yes  Neurological Headaches?: Yes Dizziness?: No  Psychologic Depression?: No Anxiety?: Yes  Physical Exam: BP 129/80 mmHg  Pulse 71  Ht '5\' 2"'$  (1.575 m)  Wt 133 lb 6.4 oz (60.51 kg)  BMI 24.39 kg/m2  Constitutional:  Alert and oriented, No acute distress. HEENT: [place] AT, moist mucus membranes.  Trachea midline, no masses. Cardiovascular: No clubbing, cyanosis, or edema. Respiratory: Normal respiratory effort, no increased work of breathing. GI: Abdomen is soft, nontender, nondistended, no abdominal masses GU: No CVA tenderness. Atrophic senile vaginitis no cystocele rectocele enterocele Skin: No rashes, bruises or suspicious lesions. Lymph: No cervical or inguinal adenopathy. Neurologic: Grossly intact, no focal deficits, moving all 4 extremities. Psychiatric: Normal mood and affect.  Laboratory Data: Lab Results  Component Value Date   WBC * 04/14/2015    2.1 A Manual Differential was performed and is consistent with the Automated Differential.   HGB 13.1 04/14/2015   HCT 39.4 04/14/2015   MCV 90.4 04/14/2015   PLT 113.0* 04/14/2015    Lab Results  Component Value Date   CREATININE 1.06 04/14/2015    No results found for: PSA  No results found for: TESTOSTERONE  Lab Results  Component Value Date   HGBA1C 5.3 06/09/2014    Urinalysis    Component Value Date/Time   COLORURINE YELLOW 04/14/2015 0956   COLORURINE Straw 03/09/2013 1329   APPEARANCEUR CLEAR 04/14/2015 0956   APPEARANCEUR Clear 03/09/2013 1329    LABSPEC 1.010 04/14/2015 0956   LABSPEC 1.008 03/09/2013 1329   PHURINE 6.0 04/14/2015 0956   PHURINE 6.0 03/09/2013 1329   GLUCOSEU NEGATIVE 04/14/2015 0956   GLUCOSEU Negative 03/09/2013 1329   GLUCOSEU NEGATIVE 12/27/2012 0515   HGBUR SMALL* 04/14/2015 0956   HGBUR 1+ 03/09/2013 1329   BILIRUBINUR NEGATIVE 04/14/2015 0956   BILIRUBINUR neg 04/14/2015 0955   BILIRUBINUR Negative 03/09/2013 1329   KETONESUR NEGATIVE 04/14/2015 0956   KETONESUR Negative 03/09/2013 1329   PROTEINUR neg 04/14/2015 0955   PROTEINUR Negative 03/09/2013 1329   PROTEINUR NEGATIVE 12/27/2012 0515   UROBILINOGEN 0.2 04/14/2015 0956   UROBILINOGEN 0.2 04/14/2015 0955   NITRITE NEGATIVE 04/14/2015 0956   NITRITE neg 04/14/2015 0955   NITRITE Negative 03/09/2013 1329   LEUKOCYTESUR NEGATIVE 04/14/2015 0956   LEUKOCYTESUR Negative 03/09/2013 1329    Pertinent Imaging: 45 mL postvoid residual  Assessment & Plan:  Overactive bladder. Treat with Vesicare 5 mg daily for a month see at the end of that time postvoid residual is minimal also gave the patient Estrace cream for atrophic senile vaginitis 1. Urinary frequency every hour  - Urinalysis, Complete - Bladder Scan (Post Void Residual) in office - solifenacin (VESICARE) 10 MG tablet; Take 1 tablet (10 mg total) by mouth daily.  Dispense: 60 tablet; Refill: 4   No Follow-up on file.  Collier Flowers, [facility] [address] [place], [place] [address] [phone]

## 2015-05-10 NOTE — Progress Notes (Signed)
Bladder Scan Patient void: 75m Performed By: KPervis Hocking CMA

## 2015-05-12 ENCOUNTER — Other Ambulatory Visit (INDEPENDENT_AMBULATORY_CARE_PROVIDER_SITE_OTHER): Payer: Medicare Other

## 2015-05-12 DIAGNOSIS — D709 Neutropenia, unspecified: Secondary | ICD-10-CM

## 2015-05-12 LAB — CBC WITH DIFFERENTIAL/PLATELET
BASOS PCT: 0.4 % (ref 0.0–3.0)
Basophils Absolute: 0 10*3/uL (ref 0.0–0.1)
EOS ABS: 0 10*3/uL (ref 0.0–0.7)
Eosinophils Relative: 0.2 % (ref 0.0–5.0)
HEMATOCRIT: 34.7 % — AB (ref 36.0–46.0)
Hemoglobin: 11.7 g/dL — ABNORMAL LOW (ref 12.0–15.0)
Lymphocytes Relative: 44.1 % (ref 12.0–46.0)
Lymphs Abs: 0.9 10*3/uL (ref 0.7–4.0)
MCHC: 33.8 g/dL (ref 30.0–36.0)
MCV: 88.8 fl (ref 78.0–100.0)
MONO ABS: 0.6 10*3/uL (ref 0.1–1.0)
Monocytes Relative: 29.2 % — ABNORMAL HIGH (ref 3.0–12.0)
NEUTROS ABS: 0.6 10*3/uL — AB (ref 1.4–7.7)
Neutrophils Relative %: 26.1 % — ABNORMAL LOW (ref 43.0–77.0)
PLATELETS: 123 10*3/uL — AB (ref 150.0–400.0)
RBC: 3.91 Mil/uL (ref 3.87–5.11)
RDW: 16.6 % — AB (ref 11.5–15.5)
WBC: 2.1 10*3/uL — ABNORMAL LOW (ref 4.0–10.5)

## 2015-05-15 ENCOUNTER — Telehealth: Payer: Self-pay | Admitting: *Deleted

## 2015-05-15 NOTE — Telephone Encounter (Signed)
Left message to return call to office.

## 2015-05-15 NOTE — Telephone Encounter (Signed)
Patient stated that she missed a phone call from the office, she stated that it was about her labs. Patient requested a call back-thanks

## 2015-05-16 ENCOUNTER — Other Ambulatory Visit: Payer: Self-pay | Admitting: Internal Medicine

## 2015-05-16 DIAGNOSIS — G629 Polyneuropathy, unspecified: Secondary | ICD-10-CM | POA: Diagnosis not present

## 2015-05-16 DIAGNOSIS — M0579 Rheumatoid arthritis with rheumatoid factor of multiple sites without organ or systems involvement: Secondary | ICD-10-CM | POA: Diagnosis not present

## 2015-05-16 DIAGNOSIS — D72819 Decreased white blood cell count, unspecified: Secondary | ICD-10-CM | POA: Diagnosis not present

## 2015-05-16 DIAGNOSIS — R748 Abnormal levels of other serum enzymes: Secondary | ICD-10-CM | POA: Diagnosis not present

## 2015-05-17 ENCOUNTER — Other Ambulatory Visit: Payer: Self-pay | Admitting: *Deleted

## 2015-05-17 NOTE — Telephone Encounter (Signed)
Okay to refill? Last filled on 04/15/15 (Per pharmacy, no refills available for controlled drug)

## 2015-05-18 MED ORDER — ALPRAZOLAM 0.5 MG PO TABS: 0.5000 mg | ORAL_TABLET | Freq: Two times a day (BID) | ORAL | Status: DC | PRN

## 2015-05-18 NOTE — Telephone Encounter (Signed)
Patient's dose was reduced two twice daily due to risk of withdrawal and overdose.  She went through 90 in the last month. So she needs to understand that i will not refill for more than 60 per month

## 2015-05-18 NOTE — Telephone Encounter (Signed)
Left message for patient to return call to office. 

## 2015-05-19 ENCOUNTER — Other Ambulatory Visit: Payer: Self-pay | Admitting: Internal Medicine

## 2015-05-30 ENCOUNTER — Telehealth: Payer: Self-pay | Admitting: Internal Medicine

## 2015-05-30 ENCOUNTER — Other Ambulatory Visit: Payer: Self-pay | Admitting: Internal Medicine

## 2015-05-30 DIAGNOSIS — I10 Essential (primary) hypertension: Secondary | ICD-10-CM

## 2015-05-30 DIAGNOSIS — J310 Chronic rhinitis: Secondary | ICD-10-CM | POA: Diagnosis not present

## 2015-05-30 DIAGNOSIS — J342 Deviated nasal septum: Secondary | ICD-10-CM | POA: Diagnosis not present

## 2015-05-30 MED ORDER — LOSARTAN POTASSIUM 50 MG PO TABS: 50.0000 mg | ORAL_TABLET | Freq: Every day | ORAL | Status: DC

## 2015-05-30 NOTE — Telephone Encounter (Signed)
LISINOPRIL suspended per ENT request due to cough,  Losartan 50 mg daily Rx,  Rx sent

## 2015-05-30 NOTE — Assessment & Plan Note (Signed)
LISINOPRIL suspended per ENT request due to cough,  Losartan 50 mg daily Rx,  Rx sent

## 2015-05-30 NOTE — Telephone Encounter (Signed)
Left message to have Lovey Newcomer return my call.

## 2015-05-30 NOTE — Telephone Encounter (Signed)
Patient on lisinopril, please advise?

## 2015-05-30 NOTE — Telephone Encounter (Signed)
Sandy Zurich from [facility] ENT from Dr Pryor Ochoa office called regarding pt. They want to know if the pt BP medication can be stopped and changed due to pts cough?

## 2015-05-30 NOTE — Telephone Encounter (Signed)
Spoke with Lovey Newcomer, she will notify patient and update the patients record.

## 2015-06-06 ENCOUNTER — Telehealth: Payer: Self-pay | Admitting: Internal Medicine

## 2015-06-06 NOTE — Telephone Encounter (Signed)
Pt called wanting to know which of the BP pills that she should take? Thank You!

## 2015-06-06 NOTE — Telephone Encounter (Signed)
Completed request.  

## 2015-06-06 NOTE — Telephone Encounter (Signed)
Left message for her to return my call, she needs to take the Losartan only!

## 2015-06-09 ENCOUNTER — Encounter: Payer: Self-pay | Admitting: Urology

## 2015-06-09 ENCOUNTER — Ambulatory Visit (INDEPENDENT_AMBULATORY_CARE_PROVIDER_SITE_OTHER): Payer: Medicare Other | Admitting: Urology

## 2015-06-09 VITALS — BP 128/74 | HR 71 | Ht 62.0 in | Wt 135.0 lb

## 2015-06-09 DIAGNOSIS — N3281 Overactive bladder: Secondary | ICD-10-CM | POA: Diagnosis not present

## 2015-06-09 DIAGNOSIS — R3129 Other microscopic hematuria: Secondary | ICD-10-CM

## 2015-06-09 LAB — MICROSCOPIC EXAMINATION
Bacteria, UA: NONE SEEN
RENAL EPITHEL UA: NONE SEEN /HPF
WBC, UA: NONE SEEN /hpf (ref 0–?)

## 2015-06-09 LAB — URINALYSIS, COMPLETE
Bilirubin, UA: NEGATIVE
Glucose, UA: NEGATIVE
Ketones, UA: NEGATIVE
LEUKOCYTES UA: NEGATIVE
Nitrite, UA: NEGATIVE
PH UA: 6 (ref 5.0–7.5)
PROTEIN UA: NEGATIVE
Specific Gravity, UA: 1.025 (ref 1.005–1.030)
UUROB: 0.2 mg/dL (ref 0.2–1.0)

## 2015-06-09 LAB — BLADDER SCAN AMB NON-IMAGING: SCAN RESULT: 60

## 2015-06-09 NOTE — Progress Notes (Signed)
06/09/2015 10:10 AM   Caroline Nichols 06-06-1941 [id number]  Referring provider: [name], MD [place], [place] [address]  Chief Complaint  Patient presents with  . Over Active Bladder    HPI: Microhematuria on UA. Overactive bladder that has failed anticholinergics this is her third or fourth anticholinergic failure for overactive bladder. Continues to have urgency frequency and sometimes loses control.    PMH: Past Medical History  Diagnosis Date  . Hypertension   . Hyperthyroidism     s/p radiation therapy, now hypothyroidism  . IBS (irritable bowel syndrome)   . Herniated disc   . Pancreatic cyst   . Arthritis     Possible RA - Follow up appt with Dr. Jefm Bryant  . H/O transfusion of packed red blood cells 02/2013    3 units PRBC for severe anemia 02/2013  . Anemia 02/2013    F/U with Dr. Grayland Ormond for Feraheme injections  . Hyperlipidemia   . Allergy   . History of kidney stones   . Migraine   . Cellulitis of arm, right   . Paroxysmal a-fib ([place])   . Diabetes insipidus ([place])     On desmopressin    Surgical History: Past Surgical History  Procedure Laterality Date  . Tubal ligation    . Cervical spine surgery      Bone fusion  . Hammer toe surgery    . Hernia repair    . Abdominal hysterectomy    . Breast surgery  1990s    Biopsy  . Breast excisional biopsy Right 2005    neg    Home Medications:    Medication List       This list is accurate as of: 06/09/15 10:10 AM.  Always use your most recent med list.               acetaminophen 325 MG tablet  Commonly known as:  TYLENOL  Take 2 tablets (650 mg total) by mouth every 6 (six) hours as needed for pain.     ALPRAZolam 0.5 MG tablet  Commonly known as:  XANAX  Take 1 tablet (0.5 mg total) by mouth 2 (two) times daily as needed for anxiety.     brimonidine 0.2 % ophthalmic solution  Commonly known as:  ALPHAGAN  Place 1 drop into both eyes 2 (two) times daily.     D3-1000 PO  Take by mouth daily.     ferrous sulfate 325 (65 FE) MG tablet  Take 325 mg by mouth daily with breakfast.     Fish Oil 1000 MG Caps  Take by mouth daily.     fluticasone 50 MCG/ACT nasal spray  Commonly known as:  FLONASE  Place 2 sprays into both nostrils daily.     folic acid 1 MG tablet  Commonly known as:  FOLVITE  Take 1 mg by mouth daily.     gabapentin 300 MG capsule  Commonly known as:  NEURONTIN  TAKE 1 CAPSULE (300 MG TOTAL) BY MOUTH 3 (THREE) TIMES DAILY.     hydrALAZINE 10 MG tablet  Commonly known as:  APRESOLINE  Take 1 tablet (10 mg total) by mouth 2 (two) times daily.     hydroxychloroquine 200 MG tablet  Commonly known as:  PLAQUENIL  Take 2 tablets (400 mg total) by mouth daily.     ipratropium 0.03 % nasal spray  Commonly known as:  ATROVENT     levothyroxine 112 MCG tablet  Commonly known as:  SYNTHROID, LEVOTHROID  TAKE 1 TABLET (112 MCG TOTAL) BY MOUTH DAILY BEFORE BREAKFAST.     losartan 50 MG tablet  Commonly known as:  COZAAR  Take 1 tablet (50 mg total) by mouth daily.     mirtazapine 15 MG tablet  Commonly known as:  REMERON  TAKE 1/2 TABLET (7.5 MG TOTAL) BY MOUTH DAILY AFTER SUPPER.     mirtazapine 7.5 MG tablet  Commonly known as:  REMERON  TAKE 1 TABLET (7.5 MG TOTAL) BY MOUTH AT BEDTIME.     nitroGLYCERIN 0.4 MG/SPRAY spray  Commonly known as:  NITROLINGUAL  Place 1 spray under the tongue every 5 (five) minutes x 3 doses as needed for chest pain.     pantoprazole 40 MG tablet  Commonly known as:  PROTONIX  TAKE 1 TABLET (40 MG TOTAL) BY MOUTH DAILY.     solifenacin 10 MG tablet  Commonly known as:  VESICARE  Take 1 tablet (10 mg total) by mouth daily.     SUPER CALCIUM/D 600-125 MG-UNIT Tabs  Generic drug:  Calcium-Vitamin D  Take by mouth. Taking 2 daily     vitamin C 500 MG tablet  Commonly known as:  ASCORBIC ACID  Take 500 mg by mouth daily.        Allergies:  Allergies  Allergen Reactions  .  Codeine     REACTION: NAUSEA  . Tetanus Toxoid     Other reaction(s): Unknown REACTION: ARM SWELLING,REDNESS  . Tetanus Toxoids   . Tramadol Nausea And Vomiting    Family History: Family History  Problem Relation Age of Onset  . Heart disease Mother   . Heart disease Father   . Diabetes Brother   . Kidney disease Brother   . Stroke Daughter     due to medication reaction  . Prostate cancer Neg Hx   . Hematuria Neg Hx     Social History:  reports that she quit smoking about 2 years ago. Her smoking use included Cigarettes. She has a 15 pack-year smoking history. She has never used smokeless tobacco. She reports that she does not drink alcohol or use illicit drugs.  ROS: UROLOGY Frequent Urination?: Yes Hard to postpone urination?: No Burning/pain with urination?: No Get up at night to urinate?: Yes Leakage of urine?: No Urine stream starts and stops?: Yes Trouble starting stream?: Yes Do you have to strain to urinate?: No Blood in urine?: No Urinary tract infection?: No Sexually transmitted disease?: No Injury to kidneys or bladder?: No Painful intercourse?: No Weak stream?: Yes Currently pregnant?: No Vaginal bleeding?: No Last menstrual period?: 1978  Gastrointestinal Nausea?: No Vomiting?: No Indigestion/heartburn?: Yes Diarrhea?: Yes Constipation?: No  Constitutional Fever: No Night sweats?: Yes Weight loss?: No Fatigue?: Yes  Skin Skin rash/lesions?: No Itching?: Yes  Eyes Blurred vision?: Yes Double vision?: No  Ears/Nose/Throat Sore throat?: No Sinus problems?: Yes  Hematologic/Lymphatic Swollen glands?: No Easy bruising?: Yes  Cardiovascular Leg swelling?: No Chest pain?: No  Respiratory Cough?: No Shortness of breath?: Yes  Endocrine Excessive thirst?: Yes  Musculoskeletal Back pain?: Yes Joint pain?: Yes  Neurological Headaches?: Yes Dizziness?: No  Psychologic Depression?: No Anxiety?: Yes  Physical Exam: BP  128/74 mmHg  Pulse 71  Ht '5\' 2"'$  (1.575 m)  Wt 135 lb (61.236 kg)  BMI 24.69 kg/m2  Constitutional:  Alert and oriented, No acute distress. HEENT: [place] AT, moist mucus membranes.  Trachea midline, no masses. Cardiovascular: No clubbing, cyanosis, or edema. Respiratory:  Normal respiratory effort, no increased work of breathing. GI: Abdomen is soft, nontender, nondistended, no abdominal masses GU: No CVA tenderness.  Skin: No rashes, bruises or suspicious lesions. Lymph: No cervical or inguinal adenopathy. Neurologic: Grossly intact, no focal deficits, moving all 4 extremities. Psychiatric: Normal mood and affect.  Laboratory Data: Lab Results  Component Value Date   WBC 2.1 Repeated and verified X2.* 05/12/2015   HGB 11.7* 05/12/2015   HCT 34.7* 05/12/2015   MCV 88.8 05/12/2015   PLT 123.0* 05/12/2015    Lab Results  Component Value Date   CREATININE 1.06 04/14/2015    No results found for: PSA  No results found for: TESTOSTERONE  Lab Results  Component Value Date   HGBA1C 5.3 06/09/2014    Urinalysis    Component Value Date/Time   COLORURINE YELLOW 04/14/2015 0956   COLORURINE Straw 03/09/2013 1329   APPEARANCEUR CLEAR 04/14/2015 0956   APPEARANCEUR Clear 03/09/2013 1329   LABSPEC 1.010 04/14/2015 0956   LABSPEC 1.008 03/09/2013 1329   PHURINE 6.0 04/14/2015 0956   PHURINE 6.0 03/09/2013 1329   GLUCOSEU Negative 05/10/2015 1006   GLUCOSEU NEGATIVE 04/14/2015 0956   GLUCOSEU Negative 03/09/2013 1329   HGBUR SMALL* 04/14/2015 0956   HGBUR 1+ 03/09/2013 1329   BILIRUBINUR Negative 05/10/2015 1006   BILIRUBINUR NEGATIVE 04/14/2015 0956   BILIRUBINUR neg 04/14/2015 0955   BILIRUBINUR Negative 03/09/2013 1329   KETONESUR NEGATIVE 04/14/2015 0956   KETONESUR Negative 03/09/2013 1329   PROTEINUR neg 04/14/2015 0955   PROTEINUR Negative 03/09/2013 1329   PROTEINUR NEGATIVE 12/27/2012 0515   UROBILINOGEN 0.2 04/14/2015 0956   UROBILINOGEN 0.2 04/14/2015 0955    NITRITE Negative 05/10/2015 1006   NITRITE NEGATIVE 04/14/2015 0956   NITRITE neg 04/14/2015 0955   NITRITE Negative 03/09/2013 1329   LEUKOCYTESUR Negative 05/10/2015 1006   LEUKOCYTESUR NEGATIVE 04/14/2015 0956   LEUKOCYTESUR Negative 03/09/2013 1329    Pertinent Imaging: CT scan to be ordered  Assessment & begin with:  1. OAB (overactive bladder) patient has failed multiple anticholinergics. She has multiple comorbidities so I plan to begin with a 12 week course of posterior tibial nerve stimulation.  - Urinalysis, Complete - Bladder Scan (Post Void Residual) in office  2. Microscopic hematuria treat with cystoscopy and CT scan to be ordered today  - CT Abdomen Pelvis W Wo Contrast; Future   No Follow-up on file.  Collier Flowers, [facility] [address] [place], [place] [address] [phone]

## 2015-06-16 ENCOUNTER — Ambulatory Visit
Admission: RE | Admit: 2015-06-16 | Discharge: 2015-06-16 | Disposition: A | Discharge status: Home / Self Care | Payer: Medicare Other | Source: Ambulatory Visit | Attending: Urology | Admitting: Urology

## 2015-06-16 DIAGNOSIS — R3129 Other microscopic hematuria: Secondary | ICD-10-CM

## 2015-06-16 DIAGNOSIS — N289 Disorder of kidney and ureter, unspecified: Secondary | ICD-10-CM | POA: Diagnosis not present

## 2015-06-16 DIAGNOSIS — R350 Frequency of micturition: Secondary | ICD-10-CM | POA: Diagnosis not present

## 2015-06-16 LAB — POCT I-STAT CREATININE: Creatinine, Ser: 0.9 mg/dL (ref 0.44–1.00)

## 2015-06-16 MED ORDER — IOHEXOL 350 MG/ML SOLN
125.0000 mL | Freq: Once | INTRAVENOUS | Status: AC | PRN
  Administered 2015-06-16: 125 mL via INTRAVENOUS

## 2015-06-20 ENCOUNTER — Ambulatory Visit (INDEPENDENT_AMBULATORY_CARE_PROVIDER_SITE_OTHER): Payer: Medicare Other | Admitting: Urology

## 2015-06-20 ENCOUNTER — Encounter: Payer: Self-pay | Admitting: Urology

## 2015-06-20 VITALS — BP 176/91 | HR 81 | Ht 62.0 in | Wt 134.4 lb

## 2015-06-20 DIAGNOSIS — N2889 Other specified disorders of kidney and ureter: Secondary | ICD-10-CM

## 2015-06-20 DIAGNOSIS — R3129 Other microscopic hematuria: Secondary | ICD-10-CM

## 2015-06-20 DIAGNOSIS — N3281 Overactive bladder: Secondary | ICD-10-CM | POA: Diagnosis not present

## 2015-06-20 LAB — MICROSCOPIC EXAMINATION: WBC, UA: NONE SEEN /hpf (ref 0–?)

## 2015-06-20 LAB — URINALYSIS, COMPLETE
Bilirubin, UA: NEGATIVE
Glucose, UA: NEGATIVE
KETONES UA: NEGATIVE
LEUKOCYTES UA: NEGATIVE
Nitrite, UA: NEGATIVE
PROTEIN UA: NEGATIVE
Specific Gravity, UA: 1.01 (ref 1.005–1.030)
Urobilinogen, Ur: 0.2 mg/dL (ref 0.2–1.0)
pH, UA: 5.5 (ref 5.0–7.5)

## 2015-06-20 MED ORDER — CIPROFLOXACIN HCL 500 MG PO TABS
500.0000 mg | ORAL_TABLET | Freq: Once | ORAL | Status: AC
  Administered 2015-06-20: 500 mg via ORAL

## 2015-06-20 MED ORDER — MIRABEGRON ER 50 MG PO TB24: 50.0000 mg | ORAL_TABLET | Freq: Every day | ORAL | Status: DC

## 2015-06-20 MED ORDER — LIDOCAINE HCL 2 % EX GEL
1.0000 "application " | Freq: Once | CUTANEOUS | Status: AC
  Administered 2015-06-20: 1 via URETHRAL

## 2015-06-20 NOTE — Addendum Note (Signed)
Addended by: Ardis Hughs on: 06/20/2015 12:33 PM   Modules accepted: Orders

## 2015-06-20 NOTE — Progress Notes (Signed)
Patient presents today for follow-up of her microscopic hematuria. She underwent a CT scan prior to her visit. She presents today for completion of her workup with cystoscopy.   In addition, the patient has overactive bladder and has been treated with several anticholinergics with no improvement in her symptoms. To my knowledge or the patient's knowledge she has not been started or tried on myrbetriq.   In the interval since she was last seen she denies any gross hematuria. She denies any progressive voiding symptoms including dysuria , worsening frequency, or incontinence. She does continue to struggle with urinary frequency and gets up 5-7 times per night.   No neurologic changes or changes to her GI tract.  Current Outpatient Prescriptions on File Prior to Visit  Medication Sig Dispense Refill  . acetaminophen (TYLENOL) 325 MG tablet Take 2 tablets (650 mg total) by mouth every 6 (six) hours as needed for pain.    Marland Kitchen ALPRAZolam (XANAX) 0.5 MG tablet Take 1 tablet (0.5 mg total) by mouth 2 (two) times daily as needed for anxiety. 60 tablet 0  . brimonidine (ALPHAGAN) 0.2 % ophthalmic solution Place 1 drop into both eyes 2 (two) times daily.    . Calcium-Vitamin D (SUPER CALCIUM/D) 600-125 MG-UNIT TABS Take by mouth. Taking 2 daily    . Cholecalciferol (D3-1000 PO) Take by mouth daily.    . ferrous sulfate 325 (65 FE) MG tablet Take 325 mg by mouth daily with breakfast.    . fluticasone (FLONASE) 50 MCG/ACT nasal spray Place 2 sprays into both nostrils daily. 16 g 6  . folic acid (FOLVITE) 1 MG tablet Take 1 mg by mouth daily.    Marland Kitchen gabapentin (NEURONTIN) 300 MG capsule TAKE 1 CAPSULE (300 MG TOTAL) BY MOUTH 3 (THREE) TIMES DAILY. 270 capsule 0  . hydrALAZINE (APRESOLINE) 10 MG tablet Take 1 tablet (10 mg total) by mouth 2 (two) times daily. 60 tablet 5  . hydroxychloroquine (PLAQUENIL) 200 MG tablet Take 2 tablets (400 mg total) by mouth daily. 60 tablet 6  . ipratropium (ATROVENT) 0.03 % nasal  spray     . levothyroxine (SYNTHROID, LEVOTHROID) 112 MCG tablet TAKE 1 TABLET (112 MCG TOTAL) BY MOUTH DAILY BEFORE BREAKFAST. 90 tablet 0  . losartan (COZAAR) 50 MG tablet Take 1 tablet (50 mg total) by mouth daily. 90 tablet 3  . mirtazapine (REMERON) 15 MG tablet TAKE 1/2 TABLET (7.5 MG TOTAL) BY MOUTH DAILY AFTER SUPPER. 30 tablet 1  . mirtazapine (REMERON) 7.5 MG tablet TAKE 1 TABLET (7.5 MG TOTAL) BY MOUTH AT BEDTIME. 30 tablet 0  . nitroGLYCERIN (NITROLINGUAL) 0.4 MG/SPRAY spray Place 1 spray under the tongue every 5 (five) minutes x 3 doses as needed for chest pain. 4.9 g 11  . Omega-3 Fatty Acids (FISH OIL) 1000 MG CAPS Take by mouth daily.    . pantoprazole (PROTONIX) 40 MG tablet TAKE 1 TABLET (40 MG TOTAL) BY MOUTH DAILY. 30 tablet 4  . solifenacin (VESICARE) 10 MG tablet Take 1 tablet (10 mg total) by mouth daily. 60 tablet 4  . vitamin C (ASCORBIC ACID) 500 MG tablet Take 500 mg by mouth daily.     No current facility-administered medications on file prior to visit.   Filed Vitals:   06/20/15 0924  BP: 176/91  Pulse: 81  Height: '5\' 2"'$  (1.575 m)  Weight: 134 lb 6.4 oz (60.963 kg)  NAD Abdomen is soft, no CVA External urethral meatus is benign appearing   Cystoscopy was performed, please  see separate seizure note. There were no abnormalities.  I have independentlyreviewed the patient's CT scan demonstrating an enhancing lower pole right renal mass probably 2 cm in size. There are several cysts that are incompletely characterized.   Impression: 73 year old female with a right lower pole enhancing renal mass otherwise a negative microscopic hematuria evaluation. She continues to be symptomatic from her overactive bladder.   Plan: we discussed her CT scan in detail. I would like to obtain an MRI for better characterization of the enhancing renal mass as well as the other cysts that are incompletely characterized on her CT scan. I did explain to her the natural history of  enhancing renal masses, express my concern for malignancy. I did reassure her that over the next 3 months therapy minimal growth and very low risk of spread. In terms of her overactive bladder, she has been set up for posterior tibial nerve stimulation. I have also started her on the Patrick 50 mg which we can hopefully wean  As she makes progress with her posterior tibial nerve stimulation.

## 2015-06-20 NOTE — Procedures (Signed)
    Cystoscopy Procedure Note  Patient identification was confirmed, informed consent was obtained, and patient was prepped using Betadine solution.  Lidocaine jelly was administered per urethral meatus.    Preoperative abx where received prior to procedure.    Procedure: - Flexible cystoscope introduced, without any difficulty.   - Thorough search of the bladder revealed:    normal urethral meatus    normal urothelium    no stones    no ulcers     no tumors    no urethral polyps    no trabeculation  - Ureteral orifices were normal in position and appearance.  Post-Procedure: - Patient tolerated the procedure well   

## 2015-06-21 DIAGNOSIS — H5203 Hypermetropia, bilateral: Secondary | ICD-10-CM | POA: Diagnosis not present

## 2015-06-21 DIAGNOSIS — H401132 Primary open-angle glaucoma, bilateral, moderate stage: Secondary | ICD-10-CM | POA: Diagnosis not present

## 2015-06-21 DIAGNOSIS — Z79899 Other long term (current) drug therapy: Secondary | ICD-10-CM | POA: Diagnosis not present

## 2015-06-21 DIAGNOSIS — H2513 Age-related nuclear cataract, bilateral: Secondary | ICD-10-CM | POA: Diagnosis not present

## 2015-06-21 DIAGNOSIS — I10 Essential (primary) hypertension: Secondary | ICD-10-CM | POA: Diagnosis not present

## 2015-06-21 DIAGNOSIS — H52223 Regular astigmatism, bilateral: Secondary | ICD-10-CM | POA: Diagnosis not present

## 2015-06-21 DIAGNOSIS — M069 Rheumatoid arthritis, unspecified: Secondary | ICD-10-CM | POA: Diagnosis not present

## 2015-06-23 ENCOUNTER — Telehealth: Payer: Self-pay | Admitting: *Deleted

## 2015-06-23 NOTE — Telephone Encounter (Signed)
LMOM for patient to contact office back about PTNS scheduling. I also told patient that her appt for the 23 of Nov would be cancelled for PTNS with Dr. Erlene Quan because she doesn't schedule PTNS appointments. Her first appointment  for PTNS will be Dec.2 at 4:15pm with Ria Comment.

## 2015-06-26 NOTE — Telephone Encounter (Signed)
Spoke with patient and she states she can't have an appt after 12 on Friday due to transportation. I spoke with Ria Comment and she agreed to  9:30 am appointments for patient. LMOM as told by the patient that her Appointments would be at 9:30 am and that they would be on Fridays and start on December 2. If any questions call office back.

## 2015-06-27 ENCOUNTER — Telehealth: Payer: Self-pay

## 2015-06-27 ENCOUNTER — Ambulatory Visit: Payer: Medicare Other | Admitting: Urology

## 2015-06-27 NOTE — Telephone Encounter (Signed)
Pt call to discuss blood pressure control LMTCB

## 2015-06-28 ENCOUNTER — Ambulatory Visit: Payer: Medicare Other | Admitting: Urology

## 2015-07-06 ENCOUNTER — Telehealth: Payer: Self-pay | Admitting: Internal Medicine

## 2015-07-06 NOTE — Telephone Encounter (Signed)
Pt called about needing her Thyroid check as well. Pt already has an appointment on 07/11/2015. Thank you!

## 2015-07-07 ENCOUNTER — Encounter: Payer: Self-pay | Admitting: Obstetrics and Gynecology

## 2015-07-07 ENCOUNTER — Ambulatory Visit (INDEPENDENT_AMBULATORY_CARE_PROVIDER_SITE_OTHER): Payer: Medicare Other | Admitting: Obstetrics and Gynecology

## 2015-07-07 VITALS — BP 153/84 | HR 85 | Ht 62.0 in | Wt 135.8 lb

## 2015-07-07 DIAGNOSIS — N3281 Overactive bladder: Secondary | ICD-10-CM | POA: Diagnosis not present

## 2015-07-07 DIAGNOSIS — R350 Frequency of micturition: Secondary | ICD-10-CM | POA: Diagnosis not present

## 2015-07-07 LAB — PTNS-PERCUTANEOUS TIBIAL NERVE STIMULATION: SCAN RESULT: 3

## 2015-07-07 NOTE — Progress Notes (Signed)
PTNS  Session # 1  Health & Social Factors: 1st tx Caffeine: 0 Alcohol: 0 Daytime voids #per day: 10-12 Night-time voids #per night: 3-4 Urgency: strong Incontinence Episodes #per day: 0 Ankle used: right Treatment Setting: 3 Feeling/ Response: sensation   Preformed By: Herbert Moors, NP  Assistant: Toniann Fail, LPN

## 2015-07-07 NOTE — Progress Notes (Signed)
Chief Complaint:  Chief Complaint  Patient presents with  . PTNS     PTNS Session #1  HPI: Patient is a 74 year old female with a history of microscopic hematuria and overactive bladder symptoms including frequency and nocturia up to 5-7 times per night. She recently underwent a hematuria workup noting a negative cystoscopy evaluation and CT scan showing a right lower pole enhancing renal mass that was otherwise negative. MRI pending further evaluation of renal mass.      Amount of leakage: none     Daytime Frequency: 10-12     Nighttime Frequency: 3-4     Pain or burning with urination: No     Perception of the need to urinate/urgency: Strong      Incontinence episodes: None     Fluid intake # of cups of fluid per day, 0# of cups of caffeine beverages per day?,0 # alcohol intake per day?      Previous Therapy:       Anticholinergics- multiple.  Started on Myrbetriq last visit and has noticed minimal improvement.  BP 153/84 mmHg  Pulse 85  Ht '5\' 2"'$  (1.575 m)  Wt 135 lb 12.8 oz (61.598 kg)  BMI 24.83 kg/m2  Contraindications present for PTNS      Pacemaker      Implantable defibrillator      History of abnormal bleeding      History of nerve damage  Discussed with patient possible complications of procedure, such as discomfort, bleeding at insertion/stimulation site, procedure consent signed  Patient goals: Reduce urgency Reduce frequency Sleep through the night and/or sleep for longer intervals  PTNS treatment: The needle electrode was inserted to the lower, inner aspect of the patient's left leg. The surface electrode was placed on the inside arch of the foot on the treatment leg. The leads that was connected to the stimulator, and the needle electrode clip was connected to the needle electrode. The stimulator that produces an adjustable retropulsed that travels to the sacral nerve plexus via the tibial nerve was increased to 3 until the patient reported significant  sensation.  Treatment Plan:  The electrode was removed from the patient's leg without difficulty to her. She will return next week for her 2nd PTNS treatment.

## 2015-07-07 NOTE — Telephone Encounter (Signed)
Tried to call patient twice. No answer

## 2015-07-11 ENCOUNTER — Telehealth: Payer: Self-pay | Admitting: Internal Medicine

## 2015-07-11 ENCOUNTER — Other Ambulatory Visit (INDEPENDENT_AMBULATORY_CARE_PROVIDER_SITE_OTHER): Payer: Medicare Other

## 2015-07-11 ENCOUNTER — Telehealth: Payer: Self-pay | Admitting: *Deleted

## 2015-07-11 DIAGNOSIS — R5383 Other fatigue: Secondary | ICD-10-CM

## 2015-07-11 DIAGNOSIS — E559 Vitamin D deficiency, unspecified: Secondary | ICD-10-CM

## 2015-07-11 DIAGNOSIS — E038 Other specified hypothyroidism: Secondary | ICD-10-CM | POA: Diagnosis not present

## 2015-07-11 DIAGNOSIS — E034 Atrophy of thyroid (acquired): Secondary | ICD-10-CM

## 2015-07-11 DIAGNOSIS — D708 Other neutropenia: Secondary | ICD-10-CM

## 2015-07-11 LAB — COMPREHENSIVE METABOLIC PANEL
ALT: 23 U/L (ref 0–35)
AST: 42 U/L — ABNORMAL HIGH (ref 0–37)
Albumin: 4 g/dL (ref 3.5–5.2)
Alkaline Phosphatase: 54 U/L (ref 39–117)
BUN: 19 mg/dL (ref 6–23)
CO2: 29 meq/L (ref 19–32)
Calcium: 9.3 mg/dL (ref 8.4–10.5)
Chloride: 105 mEq/L (ref 96–112)
Creatinine, Ser: 0.85 mg/dL (ref 0.40–1.20)
GFR: 84.08 mL/min (ref >60.00)
GLUCOSE: 133 mg/dL — AB (ref 70–99)
POTASSIUM: 3.9 meq/L (ref 3.5–5.1)
SODIUM: 141 meq/L (ref 135–145)
Total Bilirubin: 0.4 mg/dL (ref 0.2–1.2)
Total Protein: 7.3 g/dL (ref 6.0–8.3)

## 2015-07-11 LAB — CBC WITH DIFFERENTIAL/PLATELET
BASOS PCT: 0.7 % (ref 0.0–3.0)
Basophils Absolute: 0 10*3/uL (ref 0.0–0.1)
Eosinophils Absolute: 0 10*3/uL (ref 0.0–0.7)
Eosinophils Relative: 0.8 % (ref 0.0–5.0)
HEMATOCRIT: 36.3 % (ref 36.0–46.0)
Hemoglobin: 11.9 g/dL — ABNORMAL LOW (ref 12.0–15.0)
LYMPHS ABS: 0.8 10*3/uL (ref 0.7–4.0)
LYMPHS PCT: 38.9 % (ref 12.0–46.0)
MCHC: 32.8 g/dL (ref 30.0–36.0)
MCV: 87.5 fl (ref 78.0–100.0)
MONOS PCT: 27.3 % — AB (ref 3.0–12.0)
Monocytes Absolute: 0.5 10*3/uL (ref 0.1–1.0)
NEUTROS ABS: 0.6 10*3/uL — AB (ref 1.4–7.7)
Neutrophils Relative %: 32.3 % — ABNORMAL LOW (ref 43.0–77.0)
PLATELETS: 120 10*3/uL — AB (ref 150.0–400.0)
RBC: 4.15 Mil/uL (ref 3.87–5.11)
RDW: 16.5 % — AB (ref 11.5–15.5)
WBC: 1.9 10*3/uL — CL (ref 4.0–10.5)

## 2015-07-11 LAB — VITAMIN D 25 HYDROXY (VIT D DEFICIENCY, FRACTURES): VITD: 26.14 ng/mL — AB (ref 30.00–100.00)

## 2015-07-11 LAB — TSH: TSH: 1.63 u[IU]/mL (ref 0.35–4.50)

## 2015-07-11 NOTE — Telephone Encounter (Signed)
CRITICAL   WBC 1.9

## 2015-07-11 NOTE — Telephone Encounter (Signed)
Labs and dx?  

## 2015-07-11 NOTE — Telephone Encounter (Signed)
HER  WHITE COUNT HAS DROPPED CONSIDERABLY,  SHE NEEDS TO SEE HER HEMATOLOGIST.  And we will set that up but she needs to tell us who she has seen

## 2015-07-11 NOTE — Telephone Encounter (Signed)
Have tried to cal patient but no answer left message on voicemail to call office.

## 2015-07-11 NOTE — Telephone Encounter (Signed)
See MD note.

## 2015-07-12 NOTE — Telephone Encounter (Signed)
Referral is in process as requested 

## 2015-07-12 NOTE — Telephone Encounter (Signed)
Please go ahead and refer this patient. He is aware of lab results. Thanks

## 2015-07-14 ENCOUNTER — Ambulatory Visit (INDEPENDENT_AMBULATORY_CARE_PROVIDER_SITE_OTHER): Payer: Medicare Other | Admitting: Obstetrics and Gynecology

## 2015-07-14 ENCOUNTER — Encounter: Payer: Self-pay | Admitting: Obstetrics and Gynecology

## 2015-07-14 VITALS — BP 156/84 | HR 85 | Resp 16 | Ht 62.0 in | Wt 133.7 lb

## 2015-07-14 DIAGNOSIS — R350 Frequency of micturition: Secondary | ICD-10-CM | POA: Diagnosis not present

## 2015-07-14 LAB — PTNS-PERCUTANEOUS TIBIAL NERVE STIMULATION: SCAN RESULT: 3

## 2015-07-14 NOTE — Progress Notes (Signed)
PTNS Session #2  HPI: Patient is a 74 year old female with a history of microscopic hematuria and overactive bladder symptoms including frequency and nocturia up to 5-7 times per night. She recently underwent a hematuria workup noting a negative cystoscopy evaluation and CT scan showing a right lower pole enhancing renal mass that was otherwise negative. MRI pending further evaluation of renal mass.   Amount of leakage: none  Daytime Frequency: 9 improved  Nighttime Frequency: 4 unchanged  Pain or burning with urination: No  Perception of the need to urinate/urgency: Strong   Incontinence episodes: None  Fluid intake # of cups of fluid per day, 0# of cups of caffeine beverages per day?,0 # alcohol intake per day?   BP 156/84 mmHg  Pulse 85  Resp 16  Ht '5\' 2"'$  (1.575 m)  Wt 133 lb 11.2 oz (60.646 kg)  BMI 24.45 kg/m2  Previous Therapy:    Anticholinergics- multiple. Started on Myrbetriq last visit and has noticed minimal improvement. Provided prescription today.  Contraindications present for PTNS  Pacemaker  Implantable defibrillator  History of abnormal bleeding  History of nerve damage  Discussed with patient possible complications of procedure, such as discomfort, bleeding at insertion/stimulation site, procedure consent signed  Patient goals: Reduce urgency Reduce frequency Sleep through the night and/or sleep for longer intervals  PTNS treatment: The needle electrode was inserted to the lower, inner aspect of the patient's left leg. The surface electrode was placed on the inside arch of the foot on the treatment leg. The leads that was connected to the stimulator, and the needle electrode clip was connected to the needle electrode. The stimulator that produces an adjustable retropulsed that travels to the sacral nerve plexus via the tibial nerve was increased to 3 until the patient reported significant sensation.  Treatment  Plan:  The electrode was removed from the patient's leg without difficulty to her. She will return next week for her 3rd PTNS treatment.

## 2015-07-14 NOTE — Progress Notes (Signed)
PTNS  Session # 2  Health & Social Factors: Brother died. Caffeine: 0 Alcohol: 0 Daytime voids #per day: 9 Night-time voids #per night: 4 Urgency: Strong Incontinence Episodes #per day: 0 Ankle used: Left Treatment Setting: 3 Feeling/ Response: Sensory Comments: Thing #1  Preformed By: Herbert Moors, FNP  Assistant: Golden Hurter, CMA  Follow Up: 07/21/15

## 2015-07-17 ENCOUNTER — Inpatient Hospital Stay: Payer: Medicare Other

## 2015-07-21 ENCOUNTER — Encounter: Payer: Self-pay | Admitting: Obstetrics and Gynecology

## 2015-07-21 ENCOUNTER — Ambulatory Visit (INDEPENDENT_AMBULATORY_CARE_PROVIDER_SITE_OTHER): Payer: Medicare Other | Admitting: Obstetrics and Gynecology

## 2015-07-21 VITALS — BP 133/72 | HR 67 | Ht 62.0 in | Wt 133.3 lb

## 2015-07-21 DIAGNOSIS — R350 Frequency of micturition: Secondary | ICD-10-CM | POA: Diagnosis not present

## 2015-07-21 LAB — PTNS-PERCUTANEOUS TIBIAL NERVE STIMULATION: Scan Result: 2

## 2015-07-21 NOTE — Progress Notes (Signed)
PTNS Session #3  HPI: Patient is a 74 year old female with a history of microscopic hematuria and overactive bladder symptoms including frequency and nocturia up to 5-7 times per night. She recently underwent a hematuria workup noting a negative cystoscopy evaluation and CT scan showing a right lower pole enhancing renal mass that was otherwise negative. MRI pending further evaluation of renal mass.   Amount of leakage: none  Daytime Frequency: 9 improved  Nighttime Frequency: 4 unchanged  Pain or burning with urination: No  Perception of the need to urinate/urgency: Strong   Incontinence episodes: None  Fluid intake # of cups of fluid per day, 0# of cups of caffeine beverages per day?,0 # alcohol intake per day?   BP 133/72 mmHg  Pulse 67  Ht '5\' 2"'$  (1.575 m)  Wt 133 lb 4.8 oz (60.464 kg)  BMI 24.37 kg/m2  Previous Therapy:    Anticholinergics- multiple. Started on Myrbetriq last visit and has noticed minimal improvement. Provided prescription today.  Contraindications present for PTNS  Pacemaker  Implantable defibrillator  History of abnormal bleeding  History of nerve damage  Discussed with patient possible complications of procedure, such as discomfort, bleeding at insertion/stimulation site, procedure consent signed  Patient goals: Reduce urgency Reduce frequency Sleep through the night and/or sleep for longer intervals  PTNS treatment: The needle electrode was inserted to the lower, inner aspect of the patient's right leg. The surface electrode was placed on the inside arch of the foot on the treatment leg. The leads that was connected to the stimulator, and the needle electrode clip was connected to the needle electrode. The stimulator that produces an adjustable retropulsed that travels to the sacral nerve plexus via the tibial nerve was increased to 2 until the patient reported significant sensation.  Treatment Plan:  The  electrode was removed from the patient's leg without difficulty to her. She will return next week for her 4th PTNS treatment.

## 2015-07-21 NOTE — Progress Notes (Signed)
PTNS  Session # 3  Health & Social Factors: brother passed away-no change since last week Caffeine: 0 Alcohol: 0 Daytime voids #per day: 10 Night-time voids #per night: 4 Urgency: mild Incontinence Episodes #per day: 0 Ankle used: right Treatment Setting: 2 Feeling/ Response: sensation   Preformed By: Herbert Moors, NP  Assistant: Toniann Fail, LPN

## 2015-07-25 ENCOUNTER — Inpatient Hospital Stay: Payer: Medicare Other | Attending: Internal Medicine | Admitting: Internal Medicine

## 2015-07-25 VITALS — BP 162/81 | HR 74 | Temp 97.0°F | Ht 62.0 in | Wt 136.5 lb

## 2015-07-25 DIAGNOSIS — Z87891 Personal history of nicotine dependence: Secondary | ICD-10-CM

## 2015-07-25 DIAGNOSIS — D61818 Other pancytopenia: Secondary | ICD-10-CM | POA: Diagnosis not present

## 2015-07-25 DIAGNOSIS — Z79899 Other long term (current) drug therapy: Secondary | ICD-10-CM | POA: Diagnosis not present

## 2015-07-25 DIAGNOSIS — M069 Rheumatoid arthritis, unspecified: Secondary | ICD-10-CM

## 2015-07-25 DIAGNOSIS — D709 Neutropenia, unspecified: Secondary | ICD-10-CM

## 2015-07-25 DIAGNOSIS — I10 Essential (primary) hypertension: Secondary | ICD-10-CM | POA: Diagnosis not present

## 2015-07-25 DIAGNOSIS — I480 Paroxysmal atrial fibrillation: Secondary | ICD-10-CM | POA: Diagnosis not present

## 2015-07-25 DIAGNOSIS — E039 Hypothyroidism, unspecified: Secondary | ICD-10-CM | POA: Insufficient documentation

## 2015-07-25 DIAGNOSIS — K589 Irritable bowel syndrome without diarrhea: Secondary | ICD-10-CM | POA: Diagnosis not present

## 2015-07-25 DIAGNOSIS — E785 Hyperlipidemia, unspecified: Secondary | ICD-10-CM | POA: Insufficient documentation

## 2015-07-25 DIAGNOSIS — E232 Diabetes insipidus: Secondary | ICD-10-CM | POA: Diagnosis not present

## 2015-07-25 NOTE — Progress Notes (Signed)
[place] @ [facility] [phone]  [phone]     Caroline Nichols OB: [birth year]  MR#: [id number]  BTD#:[id number]  Patient Care Team: [name], MD as PCP - General (Internal Medicine) [name], MD (Internal Medicine)  CHIEF COMPLAINT:  Chief Complaint  Patient presents with  . Anemia    Referral from [place]     No history exists.    Oncology Flowsheet 01/01/2013 01/02/2013 01/02/2013 01/02/2013 01/03/2013 01/03/2013 01/04/2013  ALPRAZolam (XANAX) PO 0.5 mg 0.5 mg 0.5 mg 0.5 mg 0.5 mg 0.5 mg 0.5 mg  enoxaparin (LOVENOX) [place] - 40 mg     40 mg   40 mg  ondansetron (ZOFRAN) IV - - - - 4 mg   -  ondansetron (ZOFRAN) PO   - - - - - -    HISTORY OF PRESENT ILLNESS:   Caroline Nichols is referred to our clinic for evaluation of chronic moderate pancytopenia. She has a history of rheumatoid arthritis, for which she initially was treated with methotrexate, and since December 2014, with Plaquenil. According to Dr. Jefm Bryant the patient had had cytopenias even prior to initiation of Plaquenil, but it is not clear whether mild neutropenia which we can see on medical records from 2014 was detected while she was on methotrexate. Vitamin B12 and folate levels were normal when measured in 2014. Caroline Nichols complains of occasional pain in small joints as well as both knees, but does not have any history of recurrent infections. She only had 1 episode of upper respiratory infection, which required oral antibiotics earlier this year. She also denies any significant bleeding from any source. She denies fevers, weight loss, night sweats, nausea, vomiting, diarrhea, constipation.  REVIEW OF SYSTEMS:   Review of Systems  All other systems reviewed and are negative.    PAST MEDICAL HISTORY: Past Medical History  Diagnosis Date  . Hypertension   . Hyperthyroidism     s/p radiation therapy, now hypothyroidism  . IBS (irritable bowel syndrome)   . Herniated disc   . Pancreatic cyst   .  Arthritis     Possible RA - Follow up appt with Dr. Jefm Bryant  . H/O transfusion of packed red blood cells 02/2013    3 units PRBC for severe anemia 02/2013  . Anemia 02/2013    F/U with Dr. Grayland Ormond for Feraheme injections  . Hyperlipidemia   . Allergy   . History of kidney stones   . Migraine   . Cellulitis of arm, right   . Paroxysmal a-fib ([place])   . Diabetes insipidus ([place])     On desmopressin    PAST SURGICAL HISTORY: Past Surgical History  Procedure Laterality Date  . Tubal ligation    . Cervical spine surgery      Bone fusion  . Hammer toe surgery    . Hernia repair    . Abdominal hysterectomy    . Breast surgery  1990s    Biopsy  . Breast excisional biopsy Right 2005    neg    FAMILY HISTORY Family History  Problem Relation Age of Onset  . Heart disease Mother   . Heart disease Father   . Diabetes Brother   . Kidney disease Brother   . Stroke Daughter     due to medication reaction  . Prostate cancer Neg Hx   . Hematuria Neg Hx     ADVANCED DIRECTIVES:  No flowsheet data found.  HEALTH MAINTENANCE: Social History  Substance Use Topics  .  Smoking status: Former Smoker -- 0.50 packs/day for 30 years    Types: Cigarettes    Quit date: 12/03/2012  . Smokeless tobacco: Never Used  . Alcohol Use: No     Allergies  Allergen Reactions  . Codeine     REACTION: NAUSEA  . Tetanus Toxoid     Other reaction(s): Unknown REACTION: ARM SWELLING,REDNESS  . Tetanus Toxoids   . Tramadol Nausea And Vomiting    Current Outpatient Prescriptions  Medication Sig Dispense Refill  . acetaminophen (TYLENOL) 325 MG tablet Take 2 tablets (650 mg total) by mouth every 6 (six) hours as needed for pain.    Marland Kitchen ALPRAZolam (XANAX) 0.5 MG tablet Take 1 tablet (0.5 mg total) by mouth 2 (two) times daily as needed for anxiety. 60 tablet 0  . brimonidine (ALPHAGAN) 0.2 % ophthalmic solution Place 1 drop into both eyes 2 (two) times daily.    . Calcium-Vitamin D (SUPER CALCIUM/D)  600-125 MG-UNIT TABS Take by mouth. Taking 2 daily    . Cholecalciferol (D3-1000 PO) Take by mouth daily.    . ferrous sulfate 325 (65 FE) MG tablet Take 325 mg by mouth daily with breakfast.    . fluticasone (FLONASE) 50 MCG/ACT nasal spray Place 2 sprays into both nostrils daily. 16 g 6  . folic acid (FOLVITE) 1 MG tablet Take 1 mg by mouth daily.    Marland Kitchen gabapentin (NEURONTIN) 300 MG capsule TAKE 1 CAPSULE (300 MG TOTAL) BY MOUTH 3 (THREE) TIMES DAILY. 270 capsule 0  . hydrALAZINE (APRESOLINE) 10 MG tablet Take 1 tablet (10 mg total) by mouth 2 (two) times daily. 60 tablet 5  . hydroxychloroquine (PLAQUENIL) 200 MG tablet Take 2 tablets (400 mg total) by mouth daily. 60 tablet 6  . ipratropium (ATROVENT) 0.03 % nasal spray     . levothyroxine (SYNTHROID, LEVOTHROID) 112 MCG tablet TAKE 1 TABLET (112 MCG TOTAL) BY MOUTH DAILY BEFORE BREAKFAST. 90 tablet 0  . losartan (COZAAR) 50 MG tablet Take 1 tablet (50 mg total) by mouth daily. 90 tablet 3  . mirabegron ER (MYRBETRIQ) 50 MG TB24 tablet Take 1 tablet (50 mg total) by mouth daily. 30 tablet 2  . mirtazapine (REMERON) 15 MG tablet TAKE 1/2 TABLET (7.5 MG TOTAL) BY MOUTH DAILY AFTER SUPPER. 30 tablet 1  . mirtazapine (REMERON) 7.5 MG tablet TAKE 1 TABLET (7.5 MG TOTAL) BY MOUTH AT BEDTIME. 30 tablet 0  . nitroGLYCERIN (NITROLINGUAL) 0.4 MG/SPRAY spray Place 1 spray under the tongue every 5 (five) minutes x 3 doses as needed for chest pain. 4.9 g 11  . Omega-3 Fatty Acids (FISH OIL) 1000 MG CAPS Take by mouth daily.    . pantoprazole (PROTONIX) 40 MG tablet TAKE 1 TABLET (40 MG TOTAL) BY MOUTH DAILY. 30 tablet 4  . solifenacin (VESICARE) 10 MG tablet Take 1 tablet (10 mg total) by mouth daily. 60 tablet 4  . vitamin C (ASCORBIC ACID) 500 MG tablet Take 500 mg by mouth daily.     No current facility-administered medications for this visit.    OBJECTIVE:  Filed Vitals:   07/25/15 1054  BP: 162/81  Pulse: 74  Temp: 97 F (36.1 C)     Body  mass index is 24.95 kg/(m^2).    ECOG FS:0 - Asymptomatic  Physical Exam  Constitutional: She is oriented to person, place, and time and well-developed, well-nourished, and in no distress. No distress.  Elderly African-American female  HENT:  Head: Normocephalic and atraumatic.  Right Ear:  External ear normal.  Left Ear: External ear normal.  Mouth/Throat: Oropharynx is clear and moist.  Eyes: Conjunctivae are normal. Pupils are equal, round, and reactive to light. Right eye exhibits no discharge. Left eye exhibits no discharge. No scleral icterus.  Neck: Normal range of motion. Neck supple. No JVD present. No tracheal deviation present. No thyromegaly present.  Cardiovascular: Normal rate, regular rhythm, normal heart sounds and intact distal pulses.  Exam reveals no gallop and no friction rub.   No murmur heard. Pulmonary/Chest: Effort normal and breath sounds normal. No stridor. No respiratory distress. She has no wheezes. She has no rales. She exhibits no tenderness.  Abdominal: Soft. Bowel sounds are normal. She exhibits no distension and no mass. There is no tenderness. There is no rebound and no guarding.  Genitourinary:  Postponed  Musculoskeletal: Normal range of motion. She exhibits no edema or tenderness.  Bilateral finger joint deformities without erythema or edema  Lymphadenopathy:    She has no cervical adenopathy.  Neurological: She is alert and oriented to person, place, and time. She has normal reflexes. No cranial nerve deficit. She exhibits normal muscle tone. Gait normal. Coordination normal. GCS score is 15.  Skin: Skin is warm. No rash noted. She is not diaphoretic. No erythema. No pallor.  Psychiatric: Mood, memory, affect and judgment normal.  Nursing note and vitals reviewed.    LAB RESULTS:  Recent Results (from the past 2160 hour(s))  Urinalysis, Complete     Status: Abnormal   Collection Time: 05/10/15 10:06 AM  Result Value Ref Range   Specific Gravity,  UA 1.025 1.005 - 1.030   pH, UA 5.5 5.0 - 7.5   Color, UA Yellow Yellow   Appearance Ur Clear Clear   Leukocytes, UA Negative Negative   Protein, UA Negative Negative/Trace   Glucose, UA Negative Negative   Ketones, UA Negative Negative   RBC, UA Trace (A) Negative   Bilirubin, UA Negative Negative   Urobilinogen, Ur 0.2 0.2 - 1.0 mg/dL   Nitrite, UA Negative Negative   Microscopic Examination See below:   Microscopic Examination     Status: Abnormal   Collection Time: 05/10/15 10:06 AM  Result Value Ref Range   WBC, UA 0-5 0 -  5 /hpf   RBC, UA None seen 0 -  2 /hpf   Epithelial Cells (non renal) None seen 0 - 10 /hpf   Renal Epithel, UA None seen None seen /hpf   Mucus, UA Present (A) Not Estab.   Bacteria, UA Few (A) None seen/Few  Bladder Scan (Post Void Residual) in office     Status: None   Collection Time: 05/10/15 10:20 AM  Result Value Ref Range   Scan Result 49   CBC with Differential/Platelet     Status: Abnormal   Collection Time: 05/12/15  9:47 AM  Result Value Ref Range   WBC 2.1 Repeated and verified X2. (L) 4.0 - 10.5 K/uL   RBC 3.91 3.87 - 5.11 Mil/uL   Hemoglobin 11.7 (L) 12.0 - 15.0 g/dL   HCT 34.7 (L) 36.0 - 46.0 %   MCV 88.8 78.0 - 100.0 fl   MCHC 33.8 30.0 - 36.0 g/dL   RDW 16.6 (H) 11.5 - 15.5 %   Platelets 123.0 (L) 150.0 - 400.0 K/uL   Neutrophils Relative % 26.1 (L) 43.0 - 77.0 %   Lymphocytes Relative 44.1 12.0 - 46.0 %   Monocytes Relative 29.2 (H) 3.0 - 12.0 %   Eosinophils Relative 0.2  0.0 - 5.0 %   Basophils Relative 0.4 0.0 - 3.0 %   Neutro Abs 0.6 (L) 1.4 - 7.7 K/uL   Lymphs Abs 0.9 0.7 - 4.0 K/uL   Monocytes Absolute 0.6 0.1 - 1.0 K/uL   Eosinophils Absolute 0.0 0.0 - 0.7 K/uL   Basophils Absolute 0.0 0.0 - 0.1 K/uL  Urinalysis, Complete     Status: Abnormal   Collection Time: 06/09/15  9:36 AM  Result Value Ref Range   Specific Gravity, UA 1.025 1.005 - 1.030   pH, UA 6.0 5.0 - 7.5   Color, UA Yellow Yellow   Appearance Ur Clear  Clear   Leukocytes, UA Negative Negative   Protein, UA Negative Negative/Trace   Glucose, UA Negative Negative   Ketones, UA Negative Negative   RBC, UA 2+ (A) Negative   Bilirubin, UA Negative Negative   Urobilinogen, Ur 0.2 0.2 - 1.0 mg/dL   Nitrite, UA Negative Negative   Microscopic Examination See below:   Microscopic Examination     Status: Abnormal   Collection Time: 06/09/15  9:36 AM  Result Value Ref Range   WBC, UA None seen 0 -  5 /hpf   RBC, UA 3-10 (A) 0 -  2 /hpf   Epithelial Cells (non renal) 0-10 0 - 10 /hpf   Renal Epithel, UA None seen None seen /hpf   Bacteria, UA None seen None seen/Few  Bladder Scan (Post Void Residual) in office     Status: None   Collection Time: 06/09/15  9:41 AM  Result Value Ref Range   Scan Result 60   I-STAT creatinine     Status: None   Collection Time: 06/16/15  8:55 AM  Result Value Ref Range   Creatinine, Ser 0.90 0.44 - 1.00 mg/dL  Urinalysis, Complete     Status: Abnormal   Collection Time: 06/20/15  9:25 AM  Result Value Ref Range   Specific Gravity, UA 1.010 1.005 - 1.030   pH, UA 5.5 5.0 - 7.5   Color, UA Yellow Yellow   Appearance Ur Clear Clear   Leukocytes, UA Negative Negative   Protein, UA Negative Negative/Trace   Glucose, UA Negative Negative   Ketones, UA Negative Negative   RBC, UA 1+ (A) Negative   Bilirubin, UA Negative Negative   Urobilinogen, Ur 0.2 0.2 - 1.0 mg/dL   Nitrite, UA Negative Negative   Microscopic Examination See below:   Microscopic Examination     Status: Abnormal   Collection Time: 06/20/15  9:25 AM  Result Value Ref Range   WBC, UA None seen 0 -  5 /hpf   RBC, UA 0-2 0 -  2 /hpf   Epithelial Cells (non renal) 0-10 0 - 10 /hpf   Mucus, UA Present (A) Not Estab.   Bacteria, UA Few (A) None seen/Few  PTNS-Percutaneous Tibial Nerve Stimulati     Status: None   Collection Time: 07/07/15  9:51 AM  Result Value Ref Range   Scan Result 3   VITAMIN D 25 Hydroxy (Vit-D Deficiency,  Fractures)     Status: Abnormal   Collection Time: 07/11/15 10:16 AM  Result Value Ref Range   VITD 26.14 (L) 30.00 - 100.00 ng/mL  Comprehensive metabolic panel     Status: Abnormal   Collection Time: 07/11/15 10:16 AM  Result Value Ref Range   Sodium 141 135 - 145 mEq/L   Potassium 3.9 3.5 - 5.1 mEq/L   Chloride 105 96 - 112 mEq/L  CO2 29 19 - 32 mEq/L   Glucose, Bld 133 (H) 70 - 99 mg/dL   BUN 19 6 - 23 mg/dL   Creatinine, Ser 0.85 0.40 - 1.20 mg/dL   Total Bilirubin 0.4 0.2 - 1.2 mg/dL   Alkaline Phosphatase 54 39 - 117 U/L   AST 42 (H) 0 - 37 U/L   ALT 23 0 - 35 U/L   Total Protein 7.3 6.0 - 8.3 g/dL   Albumin 4.0 3.5 - 5.2 g/dL   Calcium 9.3 8.4 - 10.5 mg/dL   GFR 84.08 >60.00 mL/min  TSH     Status: None   Collection Time: 07/11/15 10:16 AM  Result Value Ref Range   TSH 1.63 0.35 - 4.50 uIU/mL  CBC with Differential/Platelet     Status: Abnormal   Collection Time: 07/11/15 10:16 AM  Result Value Ref Range   WBC 1.9 Repeated and verified X2. (LL) 4.0 - 10.5 K/uL   RBC 4.15 3.87 - 5.11 Mil/uL   Hemoglobin 11.9 (L) 12.0 - 15.0 g/dL   HCT 36.3 36.0 - 46.0 %   MCV 87.5 78.0 - 100.0 fl   MCHC 32.8 30.0 - 36.0 g/dL   RDW 16.5 (H) 11.5 - 15.5 %   Platelets 120.0 (L) 150.0 - 400.0 K/uL   Neutrophils Relative % 32.3 (L) 43.0 - 77.0 %   Lymphocytes Relative 38.9 12.0 - 46.0 %   Monocytes Relative 27.3 (H) 3.0 - 12.0 %   Eosinophils Relative 0.8 0.0 - 5.0 %   Basophils Relative 0.7 0.0 - 3.0 %   Neutro Abs 0.6 (L) 1.4 - 7.7 K/uL   Lymphs Abs 0.8 0.7 - 4.0 K/uL   Monocytes Absolute 0.5 0.1 - 1.0 K/uL   Eosinophils Absolute 0.0 0.0 - 0.7 K/uL   Basophils Absolute 0.0 0.0 - 0.1 K/uL  PTNS-Percutaneous Tibial Nerve Stimulati     Status: None   Collection Time: 07/14/15 10:02 AM  Result Value Ref Range   Scan Result 3   PTNS-Percutaneous Tibial Nerve Stimulati     Status: None   Collection Time: 07/21/15  2:06 PM  Result Value Ref Range   Scan Result 2      Office  Visit on 07/21/2015  Component Date Value Ref Range Status  . Scan Result 07/21/2015 2   Final       STUDIES: No results found.  ASSESSMENT and MEDICAL DECISION MAKING:  Pancytopenia-at this time I believe the most appropriate course of action is to discontinue her cranial, which can have an impact on bone marrow. I discussed this case with Dr. Jefm Bryant, who believes that it is safe to discontinue Plaquenil for 1 months, and recheck her CBC at that time. If there is significant improvement of white blood cell count and platelets, then the decision will have to be made as of how to manage her rheumatoid arthritis without myelosuppressive agents. If, however, there is no improvement, then we will likely pursue bone marrow biopsy. She will stop hydroxychloroquine today and return to our clinic in 1 month.  Patient expressed understanding and was in agreement with this plan. She also understands that She can call clinic at any time with any questions, concerns, or complaints.    No matching staging information was found for the patient.  Roxana Hires, MD   07/25/2015 11:12 AM

## 2015-07-28 ENCOUNTER — Encounter: Payer: Self-pay | Admitting: Urology

## 2015-07-28 ENCOUNTER — Ambulatory Visit (INDEPENDENT_AMBULATORY_CARE_PROVIDER_SITE_OTHER): Payer: Medicare Other | Admitting: Urology

## 2015-07-28 VITALS — BP 144/74 | HR 73 | Ht 62.0 in

## 2015-07-28 DIAGNOSIS — R350 Frequency of micturition: Secondary | ICD-10-CM | POA: Diagnosis not present

## 2015-07-28 LAB — PTNS-PERCUTANEOUS TIBIAL NERVE STIMULATION: Scan Result: 7

## 2015-07-28 NOTE — Progress Notes (Signed)
PTNS Session #4  HPI: Patient is a 74 year old female with a history of microscopic hematuria and overactive bladder symptoms including frequency and nocturia up to 5-7 times per night. She recently underwent a hematuria workup noting a negative cystoscopy evaluation and CT scan showing a right lower pole enhancing renal mass that was otherwise negative. MRI pending further evaluation of renal mass.   Amount of leakage: none  Daytime Frequency: 11 worsened  Nighttime Frequency: 4 unchanged  Pain or burning with urination: No  Perception of the need to urinate/urgency: Mild  Incontinence episodes: None  Fluid intake # of cups of fluid per day, 0# of cups of caffeine beverages per day?,0 # alcohol intake per day?   BP 144/74 mmHg  Pulse 73  Ht '5\' 2"'$  (1.575 m)  Previous Therapy:    Anticholinergics- multiple. Started on Myrbetriq last visit and has noticed minimal improvement. Provided prescription today.  Contraindications present for PTNS  Pacemaker  Implantable defibrillator  History of abnormal bleeding  History of nerve damage  Discussed with patient possible complications of procedure, such as discomfort, bleeding at insertion/stimulation site, procedure consent signed  Patient goals: Reduce urgency Reduce frequency Sleep through the night and/or sleep for longer intervals  PTNS treatment: The needle electrode was inserted to the lower, inner aspect of the patient's right leg. The surface electrode was placed on the inside arch of the foot on the treatment leg. The leads that was connected to the stimulator, and the needle electrode clip was connected to the needle electrode. The stimulator that produces an adjustable retropulsed that travels to the sacral nerve plexus via the tibial nerve was increased to 7 until the patient reported significant sensation.  Treatment Plan:  The electrode was removed from the patient's leg  without difficulty to her. She will return next week for her 5th PTNS treatment.

## 2015-08-01 ENCOUNTER — Ambulatory Visit: Payer: Medicare Other | Admitting: Obstetrics and Gynecology

## 2015-08-01 DIAGNOSIS — M069 Rheumatoid arthritis, unspecified: Secondary | ICD-10-CM | POA: Diagnosis not present

## 2015-08-01 DIAGNOSIS — H401132 Primary open-angle glaucoma, bilateral, moderate stage: Secondary | ICD-10-CM | POA: Diagnosis not present

## 2015-08-01 DIAGNOSIS — H52223 Regular astigmatism, bilateral: Secondary | ICD-10-CM | POA: Diagnosis not present

## 2015-08-01 DIAGNOSIS — Z79899 Other long term (current) drug therapy: Secondary | ICD-10-CM | POA: Diagnosis not present

## 2015-08-01 DIAGNOSIS — H5203 Hypermetropia, bilateral: Secondary | ICD-10-CM | POA: Diagnosis not present

## 2015-08-01 DIAGNOSIS — H2513 Age-related nuclear cataract, bilateral: Secondary | ICD-10-CM | POA: Diagnosis not present

## 2015-08-01 DIAGNOSIS — I10 Essential (primary) hypertension: Secondary | ICD-10-CM | POA: Diagnosis not present

## 2015-08-01 DIAGNOSIS — H1131 Conjunctival hemorrhage, right eye: Secondary | ICD-10-CM | POA: Diagnosis not present

## 2015-08-04 ENCOUNTER — Ambulatory Visit (INDEPENDENT_AMBULATORY_CARE_PROVIDER_SITE_OTHER): Payer: Medicare Other | Admitting: Obstetrics and Gynecology

## 2015-08-04 ENCOUNTER — Encounter: Payer: Self-pay | Admitting: Obstetrics and Gynecology

## 2015-08-04 VITALS — BP 161/80 | HR 73 | Resp 16 | Ht 62.0 in | Wt 134.3 lb

## 2015-08-04 DIAGNOSIS — R350 Frequency of micturition: Secondary | ICD-10-CM

## 2015-08-04 LAB — PTNS-PERCUTANEOUS TIBIAL NERVE STIMULATION: SCAN RESULT: 1

## 2015-08-04 NOTE — Progress Notes (Signed)
PTNS Session #5  HPI: Patient is a 74 year old female with a history of microscopic hematuria and overactive bladder symptoms including frequency and nocturia up to 5-7 times per night. She recently underwent a hematuria workup noting a negative cystoscopy evaluation and CT scan showing a right lower pole enhancing renal mass that was otherwise negative. MRI pending further evaluation of renal mass.   Amount of leakage: none  Daytime Frequency: 13 worsened  Nighttime Frequency: 4 unchanged  Pain or burning with urination: No  Perception of the need to urinate/urgency: Mild  Incontinence episodes: None  Fluid intake # of cups of fluid per day, 0# of cups of caffeine beverages per day?,0 # alcohol intake per day?   BP 161/80 mmHg  Pulse 73  Resp 16  Ht '5\' 2"'$  (1.575 m)  Wt 134 lb 4.8 oz (60.918 kg)  BMI 24.56 kg/m2  Previous Therapy:    Anticholinergics- multiple. Started on Myrbetriq last visit and has noticed minimal improvement. Provided prescription today.  Contraindications present for PTNS  Pacemaker  Implantable defibrillator  History of abnormal bleeding  History of nerve damage  Discussed with patient possible complications of procedure, such as discomfort, bleeding at insertion/stimulation site, procedure consent signed  Patient goals: Reduce urgency Reduce frequency Sleep through the night and/or sleep for longer intervals  PTNS treatment: The needle electrode was inserted to the lower, inner aspect of the patient's right leg. The surface electrode was placed on the inside arch of the foot on the treatment leg. The leads that was connected to the stimulator, and the needle electrode clip was connected to the needle electrode. The stimulator that produces an adjustable retropulsed that travels to the sacral nerve plexus via the tibial nerve was increased to 7 until the patient reported significant sensation.  Treatment  Plan:  The electrode was removed from the patient's leg without difficulty to her. She will return next week for her 6th PTNS treatment.

## 2015-08-04 NOTE — Progress Notes (Signed)
PTNS  Session # 5  Health & Social Factors: No change Caffeine: 0 Alcohol: 0 Daytime voids #per day: 13 Night-time voids #per night: 4 Urgency: Mild Incontinence Episodes #per day: 0 Ankle used: Right Treatment Setting: 1 Feeling/ Response: Sensory Comments: Thing #1  Preformed By: Herbert Moors, FPN  Assistant: Golden Hurter, CMA

## 2015-08-10 ENCOUNTER — Other Ambulatory Visit: Payer: Self-pay | Admitting: Internal Medicine

## 2015-08-10 MED ORDER — LEVOTHYROXINE SODIUM 112 MCG PO TABS: ORAL_TABLET | ORAL | Status: DC

## 2015-08-10 NOTE — Telephone Encounter (Signed)
90 day supply authorized and sent   

## 2015-08-10 NOTE — Telephone Encounter (Signed)
Caroline Nichols- Np prescribed medication and patient got it in July for a 90 day supply. Please advise?

## 2015-08-11 ENCOUNTER — Encounter: Payer: Self-pay | Admitting: Obstetrics and Gynecology

## 2015-08-11 ENCOUNTER — Other Ambulatory Visit: Payer: Self-pay | Admitting: Obstetrics and Gynecology

## 2015-08-11 ENCOUNTER — Ambulatory Visit (INDEPENDENT_AMBULATORY_CARE_PROVIDER_SITE_OTHER): Payer: PPO | Admitting: Obstetrics and Gynecology

## 2015-08-11 VITALS — BP 119/77 | HR 73 | Resp 16 | Ht 62.0 in | Wt 134.6 lb

## 2015-08-11 DIAGNOSIS — R350 Frequency of micturition: Secondary | ICD-10-CM | POA: Diagnosis not present

## 2015-08-11 LAB — PTNS-PERCUTANEOUS TIBIAL NERVE STIMULATION: SCAN RESULT: 5

## 2015-08-11 NOTE — Progress Notes (Signed)
PTNS  Session # 6  Health & Social Factors: No Change Caffeine: 0 Alcohol: 0 Daytime voids #per day: 13 Night-time voids #per night: 4 Urgency: Mild Incontinence Episodes #per day: 0 Ankle used: Right Treatment Setting: 5 Feeling/ Response: Sensory Comments: Thing #1  Preformed By: Herbert Moors, FNP  Assistant: Golden Hurter, CMA  Follow Up: 08/18/2015

## 2015-08-11 NOTE — Progress Notes (Signed)
PTNS Session #6  HPI: Patient is a 75 year old female with a history of microscopic hematuria and overactive bladder symptoms including frequency and nocturia up to 5-7 times per night. She recently underwent a hematuria workup noting a negative cystoscopy evaluation and CT scan showing a right lower pole enhancing renal mass that was otherwise negative. MRI to be performed in February for further evaluation of renal mass.    Amount of leakage: none  Daytime Frequency: 13 unchanged  Nighttime Frequency: 4 unchanged  Pain or burning with urination: No  Perception of the need to urinate/urgency: Mild  Incontinence episodes: None  Fluid intake # of cups of fluid per day, 0# of cups of caffeine beverages per day?,0 # alcohol intake per day?   BP 119/77 mmHg  Pulse 73  Resp 16  Ht '5\' 2"'$  (1.575 m)  Wt 134 lb 9.6 oz (61.054 kg)  BMI 24.61 kg/m2  Previous Therapy:    Anticholinergics- multiple. Started on Myrbetriq last visit and has noticed minimal improvement. Provided prescription today.  Contraindications present for PTNS  Pacemaker  Implantable defibrillator  History of abnormal bleeding  History of nerve damage  Discussed with patient possible complications of procedure, such as discomfort, bleeding at insertion/stimulation site, procedure consent has been signed.  Patient goals: Reduce urgency Reduce frequency Sleep through the night and/or sleep for longer intervals  PTNS treatment: The needle electrode was inserted to the lower, inner aspect of the patient's right leg. The surface electrode was placed on the inside arch of the foot on the treatment leg. The leads that was connected to the stimulator, and the needle electrode clip was connected to the needle electrode. The stimulator that produces an adjustable retropulsed that travels to the sacral nerve plexus via the tibial nerve was increased to  5 until the patient reported  significant sensation.  Treatment Plan:  The electrode was removed from the patient's leg without difficulty to her. She will return next week for her 7th PTNS treatment.

## 2015-08-21 ENCOUNTER — Telehealth: Payer: Self-pay | Admitting: Urology

## 2015-08-21 DIAGNOSIS — N289 Disorder of kidney and ureter, unspecified: Secondary | ICD-10-CM

## 2015-08-21 NOTE — Telephone Encounter (Signed)
I got a call from the scheduling department and they said that the MRI that dr. Louis Meckel ordered for this patient is incorrect. It needs to be [id number] can you please put in a new order so that this can get done before her appt in Feb? They are already booked out into Feb and they said we needed to get this done sooner than later.  Thanks,  Sharyn Lull

## 2015-08-21 NOTE — Telephone Encounter (Signed)
New order has been placed. Old order cancelled.

## 2015-08-22 ENCOUNTER — Inpatient Hospital Stay (HOSPITAL_BASED_OUTPATIENT_CLINIC_OR_DEPARTMENT_OTHER): Payer: PPO

## 2015-08-22 ENCOUNTER — Other Ambulatory Visit: Payer: Self-pay | Admitting: *Deleted

## 2015-08-22 ENCOUNTER — Inpatient Hospital Stay: Payer: PPO | Attending: Internal Medicine | Admitting: Internal Medicine

## 2015-08-22 VITALS — BP 147/76 | HR 80 | Temp 98.0°F | Wt 135.9 lb

## 2015-08-22 DIAGNOSIS — D61818 Other pancytopenia: Secondary | ICD-10-CM

## 2015-08-22 DIAGNOSIS — K589 Irritable bowel syndrome without diarrhea: Secondary | ICD-10-CM | POA: Diagnosis not present

## 2015-08-22 DIAGNOSIS — M069 Rheumatoid arthritis, unspecified: Secondary | ICD-10-CM | POA: Insufficient documentation

## 2015-08-22 DIAGNOSIS — Z87891 Personal history of nicotine dependence: Secondary | ICD-10-CM | POA: Diagnosis not present

## 2015-08-22 DIAGNOSIS — D72829 Elevated white blood cell count, unspecified: Secondary | ICD-10-CM

## 2015-08-22 DIAGNOSIS — E785 Hyperlipidemia, unspecified: Secondary | ICD-10-CM | POA: Diagnosis not present

## 2015-08-22 DIAGNOSIS — Z7952 Long term (current) use of systemic steroids: Secondary | ICD-10-CM | POA: Diagnosis not present

## 2015-08-22 DIAGNOSIS — I10 Essential (primary) hypertension: Secondary | ICD-10-CM | POA: Diagnosis not present

## 2015-08-22 DIAGNOSIS — D709 Neutropenia, unspecified: Secondary | ICD-10-CM

## 2015-08-22 DIAGNOSIS — Z79899 Other long term (current) drug therapy: Secondary | ICD-10-CM | POA: Diagnosis not present

## 2015-08-22 LAB — CBC WITH DIFFERENTIAL/PLATELET
BASOS ABS: 0 10*3/uL (ref 0–0.1)
BASOS PCT: 1 %
EOS ABS: 0 10*3/uL (ref 0–0.7)
Eosinophils Relative: 0 %
HCT: 36.8 % (ref 35.0–47.0)
HEMOGLOBIN: 12.1 g/dL (ref 12.0–16.0)
Lymphocytes Relative: 37 %
Lymphs Abs: 1 10*3/uL (ref 1.0–3.6)
MCH: 28.6 pg (ref 26.0–34.0)
MCHC: 32.9 g/dL (ref 32.0–36.0)
MCV: 87.1 fL (ref 80.0–100.0)
MONO ABS: 0.5 10*3/uL (ref 0.2–0.9)
MONOS PCT: 20 %
NEUTROS PCT: 42 %
Neutro Abs: 1.1 10*3/uL — ABNORMAL LOW (ref 1.4–6.5)
Platelets: 82 10*3/uL — ABNORMAL LOW (ref 150–440)
RBC: 4.22 MIL/uL (ref 3.80–5.20)
RDW: 16.9 % — ABNORMAL HIGH (ref 11.5–14.5)
WBC: 2.6 10*3/uL — ABNORMAL LOW (ref 3.6–11.0)

## 2015-08-22 NOTE — Progress Notes (Signed)
[facility] Telephone:([phone]) [phone]  [phone]) [phone]     Caroline Nichols OB: 1940/12/29  MR#: [id number]  QIO#:[id number]  Patient Care Team: [name], MD as PCP - General (Internal Medicine) [name], MD (Internal Medicine)  CHIEF COMPLAINT:  No chief complaint on file.    No history exists.    Oncology Flowsheet 01/01/2013 01/02/2013 01/02/2013 01/02/2013 01/03/2013 01/03/2013 01/04/2013  ALPRAZolam (XANAX) PO 0.5 mg 0.5 mg 0.5 mg 0.5 mg 0.5 mg 0.5 mg 0.5 mg  enoxaparin (LOVENOX) [place] - 40 mg     40 mg   40 mg  ondansetron (ZOFRAN) IV - - - - 4 mg   -  ondansetron (ZOFRAN) PO   - - - - - -    HISTORY OF PRESENT ILLNESS:   Caroline Nichols is referred to our clinic for evaluation of chronic moderate pancytopenia. She has a history of rheumatoid arthritis, for which she initially was treated with methotrexate, and since December 2014, with Plaquenil. According to Dr. Jefm Bryant the patient had had cytopenias even prior to initiation of Plaquenil, but it is not clear whether mild neutropenia which we can see on medical records from 2014 was detected while she was on methotrexate. Vitamin B12 and folate levels were normal when measured in 2014. She didn't have any history of recurrent infections. Her arthritis has been well controlled on blood continue with occasional pain in small joints as well as both knees.    Current status:  Caroline Nichols returns to our clinic for follow-up visit. After the initial evaluation upon our request she discontinued the use of Plaquenil, and has had significant worsening of arthritis with persistent pain in multiple small joints as well as her knees. She received treatment with steroid taper, and currently is at 5 mg of prednisone a day, but is still uncomfortable, claiming that the pain only stays controlled temporarily for few hours after prednisone. She did not have any infectious complications over the past months. She denies any bleeding.  She  denies fevers, weight loss, night sweats, nausea, vomiting, diarrhea, constipation.  REVIEW OF SYSTEMS:   Review of Systems  All other systems reviewed and are negative.    PAST MEDICAL HISTORY: Past Medical History  Diagnosis Date  . Hypertension   . Hyperthyroidism     s/p radiation therapy, now hypothyroidism  . IBS (irritable bowel syndrome)   . Herniated disc   . Pancreatic cyst   . Arthritis     Possible RA - Follow up appt with Dr. Jefm Bryant  . H/O transfusion of packed red blood cells 02/2013    3 units PRBC for severe anemia 02/2013  . Anemia 02/2013    F/U with Dr. Grayland Ormond for Feraheme injections  . Hyperlipidemia   . Allergy   . History of kidney stones   . Migraine   . Cellulitis of arm, right   . Paroxysmal a-fib ([place])   . Diabetes insipidus ([place])     On desmopressin    PAST SURGICAL HISTORY: Past Surgical History  Procedure Laterality Date  . Tubal ligation    . Cervical spine surgery      Bone fusion  . Hammer toe surgery    . Hernia repair    . Abdominal hysterectomy    . Breast surgery  1990s    Biopsy  . Breast excisional biopsy Right 2005    neg    FAMILY HISTORY Family History  Problem Relation Age of Onset  .  Heart disease Mother   . Heart disease Father   . Diabetes Brother   . Kidney disease Brother   . Stroke Daughter     due to medication reaction  . Prostate cancer Neg Hx   . Hematuria Neg Hx     ADVANCED DIRECTIVES:  No flowsheet data found.  HEALTH MAINTENANCE: Social History  Substance Use Topics  . Smoking status: Former Smoker -- 0.50 packs/day for 30 years    Types: Cigarettes    Quit date: 12/03/2012  . Smokeless tobacco: Never Used  . Alcohol Use: No     Allergies  Allergen Reactions  . Codeine     REACTION: NAUSEA  . Tetanus Toxoid     Other reaction(s): Unknown REACTION: ARM SWELLING,REDNESS  . Tetanus Toxoids   . Tramadol Nausea And Vomiting    Current Outpatient Prescriptions  Medication Sig  Dispense Refill  . acetaminophen (TYLENOL) 325 MG tablet Take 2 tablets (650 mg total) by mouth every 6 (six) hours as needed for pain.    Marland Kitchen ALPRAZolam (XANAX) 0.5 MG tablet Take 1 tablet (0.5 mg total) by mouth 2 (two) times daily as needed for anxiety. 60 tablet 0  . brimonidine (ALPHAGAN) 0.2 % ophthalmic solution Place 1 drop into both eyes 2 (two) times daily.    . Calcium-Vitamin D (SUPER CALCIUM/D) 600-125 MG-UNIT TABS Take by mouth. Taking 2 daily    . Cholecalciferol (D3-1000 PO) Take by mouth daily. Reported on 07/28/2015    . ferrous sulfate 325 (65 FE) MG tablet Take 325 mg by mouth daily with breakfast.    . fluticasone (FLONASE) 50 MCG/ACT nasal spray Place 2 sprays into both nostrils daily. 16 g 6  . folic acid (FOLVITE) 1 MG tablet Take 1 mg by mouth daily.    Marland Kitchen gabapentin (NEURONTIN) 300 MG capsule TAKE 1 CAPSULE (300 MG TOTAL) BY MOUTH 3 (THREE) TIMES DAILY. 270 capsule 0  . hydrALAZINE (APRESOLINE) 10 MG tablet Take 1 tablet (10 mg total) by mouth 2 (two) times daily. 60 tablet 5  . hydroxychloroquine (PLAQUENIL) 200 MG tablet Take 2 tablets (400 mg total) by mouth daily. 60 tablet 6  . ipratropium (ATROVENT) 0.03 % nasal spray Reported on 07/28/2015    . levothyroxine (SYNTHROID, LEVOTHROID) 112 MCG tablet TAKE 1 TABLET (112 MCG TOTAL) BY MOUTH DAILY BEFORE BREAKFAST. 90 tablet 1  . losartan (COZAAR) 50 MG tablet Take 1 tablet (50 mg total) by mouth daily. 90 tablet 3  . mirabegron ER (MYRBETRIQ) 50 MG TB24 tablet Take 1 tablet (50 mg total) by mouth daily. 30 tablet 2  . mirtazapine (REMERON) 15 MG tablet TAKE 1/2 TABLET (7.5 MG TOTAL) BY MOUTH DAILY AFTER SUPPER. 30 tablet 1  . mirtazapine (REMERON) 7.5 MG tablet TAKE 1 TABLET (7.5 MG TOTAL) BY MOUTH AT BEDTIME. 30 tablet 0  . nitroGLYCERIN (NITROLINGUAL) 0.4 MG/SPRAY spray Place 1 spray under the tongue every 5 (five) minutes x 3 doses as needed for chest pain. 4.9 g 11  . Omega-3 Fatty Acids (FISH OIL) 1000 MG CAPS Take by  mouth daily.    . pantoprazole (PROTONIX) 40 MG tablet TAKE 1 TABLET (40 MG TOTAL) BY MOUTH DAILY. 30 tablet 4  . predniSONE (DELTASONE) 5 MG tablet 6 day taper 6 tablets, 5 tablets, 4 tablets, 3 tablets, 2 tablets, 1 tablet Till finished.    . solifenacin (VESICARE) 10 MG tablet Take 1 tablet (10 mg total) by mouth daily. 60 tablet 4  . vitamin C (  ASCORBIC ACID) 500 MG tablet Take 500 mg by mouth daily.     No current facility-administered medications for this visit.    OBJECTIVE:  Filed Vitals:   08/22/15 1009  BP: 147/76  Pulse: 80  Temp: 98 F (36.7 C)     Body mass index is 24.85 kg/(m^2).    ECOG FS:1 - Symptomatic but completely ambulatory  Physical Exam  Constitutional: She is oriented to person, place, and time and well-developed, well-nourished, and in no distress. No distress.  Elderly African-American female  HENT:  Head: Normocephalic and atraumatic.  Right Ear: External ear normal.  Left Ear: External ear normal.  Mouth/Throat: Oropharynx is clear and moist.  Eyes: Conjunctivae are normal. Pupils are equal, round, and reactive to light. Right eye exhibits no discharge. Left eye exhibits no discharge. No scleral icterus.  Neck: Normal range of motion. Neck supple. No JVD present. No tracheal deviation present. No thyromegaly present.  Cardiovascular: Normal rate, regular rhythm, normal heart sounds and intact distal pulses.  Exam reveals no gallop and no friction rub.   No murmur heard. Pulmonary/Chest: Effort normal and breath sounds normal. No stridor. No respiratory distress. She has no wheezes. She has no rales. She exhibits no tenderness.  Abdominal: Soft. Bowel sounds are normal. She exhibits no distension and no mass. There is no tenderness. There is no rebound and no guarding.  Genitourinary:  Postponed  Musculoskeletal: Normal range of motion. She exhibits no edema or tenderness.  Bilateral finger joint deformities without erythema or edema  Lymphadenopathy:     She has no cervical adenopathy.  Neurological: She is alert and oriented to person, place, and time. She has normal reflexes. No cranial nerve deficit. She exhibits normal muscle tone. Gait normal. Coordination normal. GCS score is 15.  Skin: Skin is warm. No rash noted. She is not diaphoretic. No erythema. No pallor.  Psychiatric: Mood, memory, affect and judgment normal.  Nursing note and vitals reviewed.    LAB RESULTS:  Recent Results (from the past 2160 hour(s))  Urinalysis, Complete     Status: Abnormal   Collection Time: 06/09/15  9:36 AM  Result Value Ref Range   Specific Gravity, UA 1.025 1.005 - 1.030   pH, UA 6.0 5.0 - 7.5   Color, UA Yellow Yellow   Appearance Ur Clear Clear   Leukocytes, UA Negative Negative   Protein, UA Negative Negative/Trace   Glucose, UA Negative Negative   Ketones, UA Negative Negative   RBC, UA 2+ (A) Negative   Bilirubin, UA Negative Negative   Urobilinogen, Ur 0.2 0.2 - 1.0 mg/dL   Nitrite, UA Negative Negative   Microscopic Examination See below:   Microscopic Examination     Status: Abnormal   Collection Time: 06/09/15  9:36 AM  Result Value Ref Range   WBC, UA None seen 0 -  5 /hpf   RBC, UA 3-10 (A) 0 -  2 /hpf   Epithelial Cells (non renal) 0-10 0 - 10 /hpf   Renal Epithel, UA None seen None seen /hpf   Bacteria, UA None seen None seen/Few  Bladder Scan (Post Void Residual) in office     Status: None   Collection Time: 06/09/15  9:41 AM  Result Value Ref Range   Scan Result 60   I-STAT creatinine     Status: None   Collection Time: 06/16/15  8:55 AM  Result Value Ref Range   Creatinine, Ser 0.90 0.44 - 1.00 mg/dL  Urinalysis, Complete  Status: Abnormal   Collection Time: 06/20/15  9:25 AM  Result Value Ref Range   Specific Gravity, UA 1.010 1.005 - 1.030   pH, UA 5.5 5.0 - 7.5   Color, UA Yellow Yellow   Appearance Ur Clear Clear   Leukocytes, UA Negative Negative   Protein, UA Negative Negative/Trace   Glucose, UA  Negative Negative   Ketones, UA Negative Negative   RBC, UA 1+ (A) Negative   Bilirubin, UA Negative Negative   Urobilinogen, Ur 0.2 0.2 - 1.0 mg/dL   Nitrite, UA Negative Negative   Microscopic Examination See below:   Microscopic Examination     Status: Abnormal   Collection Time: 06/20/15  9:25 AM  Result Value Ref Range   WBC, UA None seen 0 -  5 /hpf   RBC, UA 0-2 0 -  2 /hpf   Epithelial Cells (non renal) 0-10 0 - 10 /hpf   Mucus, UA Present (A) Not Estab.   Bacteria, UA Few (A) None seen/Few  PTNS-Percutaneous Tibial Nerve Stimulati     Status: None   Collection Time: 07/07/15  9:51 AM  Result Value Ref Range   Scan Result 3   VITAMIN D 25 Hydroxy (Vit-D Deficiency, Fractures)     Status: Abnormal   Collection Time: 07/11/15 10:16 AM  Result Value Ref Range   VITD 26.14 (L) 30.00 - 100.00 ng/mL  Comprehensive metabolic panel     Status: Abnormal   Collection Time: 07/11/15 10:16 AM  Result Value Ref Range   Sodium 141 135 - 145 mEq/L   Potassium 3.9 3.5 - 5.1 mEq/L   Chloride 105 96 - 112 mEq/L   CO2 29 19 - 32 mEq/L   Glucose, Bld 133 (H) 70 - 99 mg/dL   BUN 19 6 - 23 mg/dL   Creatinine, Ser 0.85 0.40 - 1.20 mg/dL   Total Bilirubin 0.4 0.2 - 1.2 mg/dL   Alkaline Phosphatase 54 39 - 117 U/L   AST 42 (H) 0 - 37 U/L   ALT 23 0 - 35 U/L   Total Protein 7.3 6.0 - 8.3 g/dL   Albumin 4.0 3.5 - 5.2 g/dL   Calcium 9.3 8.4 - 10.5 mg/dL   GFR 84.08 >60.00 mL/min  TSH     Status: None   Collection Time: 07/11/15 10:16 AM  Result Value Ref Range   TSH 1.63 0.35 - 4.50 uIU/mL  CBC with Differential/Platelet     Status: Abnormal   Collection Time: 07/11/15 10:16 AM  Result Value Ref Range   WBC 1.9 Repeated and verified X2. (LL) 4.0 - 10.5 K/uL   RBC 4.15 3.87 - 5.11 Mil/uL   Hemoglobin 11.9 (L) 12.0 - 15.0 g/dL   HCT 36.3 36.0 - 46.0 %   MCV 87.5 78.0 - 100.0 fl   MCHC 32.8 30.0 - 36.0 g/dL   RDW 16.5 (H) 11.5 - 15.5 %   Platelets 120.0 (L) 150.0 - 400.0 K/uL    Neutrophils Relative % 32.3 (L) 43.0 - 77.0 %   Lymphocytes Relative 38.9 12.0 - 46.0 %   Monocytes Relative 27.3 (H) 3.0 - 12.0 %   Eosinophils Relative 0.8 0.0 - 5.0 %   Basophils Relative 0.7 0.0 - 3.0 %   Neutro Abs 0.6 (L) 1.4 - 7.7 K/uL   Lymphs Abs 0.8 0.7 - 4.0 K/uL   Monocytes Absolute 0.5 0.1 - 1.0 K/uL   Eosinophils Absolute 0.0 0.0 - 0.7 K/uL   Basophils Absolute 0.0 0.0 -  0.1 K/uL  PTNS-Percutaneous Tibial Nerve Stimulati     Status: None   Collection Time: 07/14/15 10:02 AM  Result Value Ref Range   Scan Result 3   PTNS-Percutaneous Tibial Nerve Stimulati     Status: None   Collection Time: 07/21/15  2:06 PM  Result Value Ref Range   Scan Result 2   PTNS-Percutaneous Tibial Nerve Stimulati     Status: None   Collection Time: 07/28/15 12:06 PM  Result Value Ref Range   Scan Result 7   PTNS-Percutaneous Tibial Nerve Stimulati     Status: None   Collection Time: 08/04/15  9:55 AM  Result Value Ref Range   Scan Result 1   PTNS-Percutaneous Tibial Nerve Stimulati     Status: None   Collection Time: 08/11/15  9:57 AM  Result Value Ref Range   Scan Result 5      No visits with results within 5 Day(s) from this visit. Latest known visit with results is:  Office Visit on 08/11/2015  Component Date Value Ref Range Status  . Scan Result 08/11/2015 5   Final       STUDIES: No results found.  ASSESSMENT and MEDICAL DECISION MAKING:  Pancytopenia- initially it felt that Plaquenil was the most reasonable explanation for the degree of neutropenia and thrombocytopenia, so the most appropriate course of action seemed to be to discontinue Plaquenil. Unfortunately, rheumatoid arthritis has flared up, and there has been no significant improvement in her white blood cell count. Quite likely, prednisone is responsible for mild increase in white blood cell count and absolute neutrophil count. Moreover, her platelet count continued to decline. In this situation it seems  reasonable to restart Plaquenil,, to better control her symptoms, while we continue with our workup, which will include bone marrow biopsy in the next week or so to rule out underlying myelodysplastic syndrome or other hematologic malignancy. She will return to our clinic 2 weeks later after the bone marrow biopsy. Case was discussed with Dr. Baruch Gouty (Rheumatology)  Patient expressed understanding and was in agreement with this plan. She also understands that She can call clinic at any time with any questions, concerns, or complaints.    No matching staging information was found for the patient.  Roxana Hires, MD   08/22/2015 9:44 AM

## 2015-08-22 NOTE — Progress Notes (Signed)
Patient here for follow up complaining of pain since discontinuing Placequinel.

## 2015-08-22 NOTE — Addendum Note (Signed)
Addended by: Georgeanne Nim F on: 08/22/2015 02:06 PM   Modules accepted: Orders

## 2015-08-22 NOTE — Addendum Note (Signed)
Addended by: Livia Snellen on: 08/22/2015 12:10 PM   Modules accepted: Orders

## 2015-08-25 ENCOUNTER — Other Ambulatory Visit: Payer: Self-pay | Admitting: *Deleted

## 2015-08-25 ENCOUNTER — Ambulatory Visit: Payer: PPO | Admitting: Obstetrics and Gynecology

## 2015-08-30 ENCOUNTER — Other Ambulatory Visit: Payer: Self-pay | Admitting: Radiology

## 2015-08-31 ENCOUNTER — Ambulatory Visit
Admission: RE | Admit: 2015-08-31 | Discharge: 2015-08-31 | Disposition: A | Discharge status: Home / Self Care | Payer: PPO | Source: Ambulatory Visit | Attending: Family Medicine | Admitting: Family Medicine

## 2015-08-31 ENCOUNTER — Other Ambulatory Visit: Payer: Self-pay | Admitting: Internal Medicine

## 2015-08-31 ENCOUNTER — Encounter: Payer: Self-pay | Admitting: Internal Medicine

## 2015-08-31 DIAGNOSIS — M1990 Unspecified osteoarthritis, unspecified site: Secondary | ICD-10-CM | POA: Diagnosis not present

## 2015-08-31 DIAGNOSIS — E232 Diabetes insipidus: Secondary | ICD-10-CM | POA: Diagnosis not present

## 2015-08-31 DIAGNOSIS — I480 Paroxysmal atrial fibrillation: Secondary | ICD-10-CM | POA: Insufficient documentation

## 2015-08-31 DIAGNOSIS — Z87442 Personal history of urinary calculi: Secondary | ICD-10-CM | POA: Insufficient documentation

## 2015-08-31 DIAGNOSIS — E785 Hyperlipidemia, unspecified: Secondary | ICD-10-CM | POA: Insufficient documentation

## 2015-08-31 DIAGNOSIS — E0590 Thyrotoxicosis, unspecified without thyrotoxic crisis or storm: Secondary | ICD-10-CM | POA: Insufficient documentation

## 2015-08-31 DIAGNOSIS — Z01812 Encounter for preprocedural laboratory examination: Secondary | ICD-10-CM | POA: Insufficient documentation

## 2015-08-31 DIAGNOSIS — D709 Neutropenia, unspecified: Secondary | ICD-10-CM | POA: Insufficient documentation

## 2015-08-31 DIAGNOSIS — D649 Anemia, unspecified: Secondary | ICD-10-CM | POA: Insufficient documentation

## 2015-08-31 DIAGNOSIS — D696 Thrombocytopenia, unspecified: Secondary | ICD-10-CM | POA: Diagnosis not present

## 2015-08-31 DIAGNOSIS — Z87891 Personal history of nicotine dependence: Secondary | ICD-10-CM | POA: Diagnosis not present

## 2015-08-31 DIAGNOSIS — I10 Essential (primary) hypertension: Secondary | ICD-10-CM | POA: Diagnosis not present

## 2015-08-31 LAB — DIFFERENTIAL
BAND NEUTROPHILS: 0 %
BASOS PCT: 0 %
Basophils Absolute: 0 10*3/uL (ref 0–0.1)
Blasts: 0 %
Eosinophils Absolute: 0 10*3/uL (ref 0–0.7)
Eosinophils Relative: 0 %
LYMPHS ABS: 1.1 10*3/uL (ref 1.0–3.6)
LYMPHS PCT: 53 %
METAMYELOCYTES PCT: 0 %
MONO ABS: 0.2 10*3/uL (ref 0.2–0.9)
Monocytes Relative: 11 %
Myelocytes: 0 %
NEUTROS ABS: 0.7 10*3/uL — AB (ref 1.4–6.5)
NRBC: 0 /100{WBCs}
Neutrophils Relative %: 36 %
OTHER: 0 %
PROMYELOCYTES ABS: 0 %

## 2015-08-31 LAB — CBC
HEMATOCRIT: 36.8 % (ref 35.0–47.0)
HEMOGLOBIN: 12.3 g/dL (ref 12.0–16.0)
MCH: 29.3 pg (ref 26.0–34.0)
MCHC: 33.3 g/dL (ref 32.0–36.0)
MCV: 88 fL (ref 80.0–100.0)
PLATELETS: 78 10*3/uL — AB (ref 150–440)
RBC: 4.18 MIL/uL (ref 3.80–5.20)
RDW: 16.9 % — ABNORMAL HIGH (ref 11.5–14.5)
WBC: 2 10*3/uL — AB (ref 3.6–11.0)

## 2015-08-31 MED ORDER — SODIUM CHLORIDE 0.9 % IV SOLN: Freq: Once | INTRAVENOUS | Status: DC

## 2015-08-31 MED ORDER — MIDAZOLAM HCL 2 MG/2ML IJ SOLN
INTRAMUSCULAR | Status: AC | PRN
  Administered 2015-08-31 (×2): 1 mg via INTRAVENOUS

## 2015-08-31 MED ORDER — FENTANYL CITRATE (PF) 100 MCG/2ML IJ SOLN
INTRAMUSCULAR | Status: AC | PRN
  Administered 2015-08-31 (×2): 50 ug via INTRAVENOUS

## 2015-08-31 NOTE — H&P (Signed)
Chief Complaint: unexpained neutropenia, here for BM asp and bx  Referring Physician(s): Herring,Leslie F  History of Present Illness: Caroline Nichols is a 75 y.o. female with chronic unexplained neutropenia and anemia.  Here for outpt BM bx. No complaints and remains asymptomatic.  Past Medical History  Diagnosis Date  . Hypertension   . Hyperthyroidism     s/p radiation therapy, now hypothyroidism  . IBS (irritable bowel syndrome)   . Herniated disc   . Pancreatic cyst   . Arthritis     Possible RA - Follow up appt with Dr. Jefm Bryant  . H/O transfusion of packed red blood cells 02/2013    3 units PRBC for severe anemia 02/2013  . Anemia 02/2013    F/U with Dr. Grayland Ormond for Feraheme injections  . Hyperlipidemia   . Allergy   . History of kidney stones   . Migraine   . Cellulitis of arm, right   . Paroxysmal a-fib ([place])   . Diabetes insipidus ([place])     On desmopressin    Past Surgical History  Procedure Laterality Date  . Tubal ligation    . Cervical spine surgery      Bone fusion  . Hammer toe surgery    . Hernia repair    . Abdominal hysterectomy    . Breast surgery  1990s    Biopsy  . Breast excisional biopsy Right 2005    neg    Allergies: Codeine; Tetanus toxoid; Tetanus toxoids; and Tramadol  Medications: Prior to Admission medications   Medication Sig Start Date End Date Taking? Authorizing Provider  acetaminophen (TYLENOL) 325 MG tablet Take 2 tablets (650 mg total) by mouth every 6 (six) hours as needed for pain. 01/04/13  Yes Modena Jansky, MD  ALPRAZolam Duanne Moron) 0.5 MG tablet Take 1 tablet (0.5 mg total) by mouth 2 (two) times daily as needed for anxiety. 05/18/15  Yes [name], MD  brimonidine (ALPHAGAN) 0.2 % ophthalmic solution Place 1 drop into both eyes 2 (two) times daily. 10/31/14  Yes Historical Provider, MD  Calcium-Vitamin D (SUPER CALCIUM/D) 600-125 MG-UNIT TABS Take by mouth. Taking 2 daily   Yes Historical Provider, MD    Cholecalciferol (D3-1000 PO) Take by mouth daily. Reported on 07/28/2015   Yes Historical Provider, MD  ferrous sulfate 325 (65 FE) MG tablet Take 325 mg by mouth daily with breakfast.   Yes Historical Provider, MD  fluticasone (FLONASE) 50 MCG/ACT nasal spray Place 2 sprays into both nostrils daily. 11/14/14  Yes [name], MD  folic acid (FOLVITE) 1 MG tablet Take 1 mg by mouth daily. 11/11/13  Yes Raquel Dagoberto Ligas, NP  gabapentin (NEURONTIN) 300 MG capsule TAKE 1 CAPSULE (300 MG TOTAL) BY MOUTH 3 (THREE) TIMES DAILY. 05/22/15  Yes [name], MD  hydrALAZINE (APRESOLINE) 10 MG tablet Take 1 tablet (10 mg total) by mouth 2 (two) times daily. 10/18/14  Yes Rubbie Battiest, NP  hydroxychloroquine (PLAQUENIL) 200 MG tablet Take 2 tablets (400 mg total) by mouth daily. 11/11/14  Yes Rubbie Battiest, NP  ipratropium (ATROVENT) 0.03 % nasal spray Reported on 07/28/2015 05/30/15  Yes Historical Provider, MD  levothyroxine (SYNTHROID, LEVOTHROID) 112 MCG tablet TAKE 1 TABLET (112 MCG TOTAL) BY MOUTH DAILY BEFORE BREAKFAST. 08/10/15  Yes [name], MD  losartan (COZAAR) 50 MG tablet Take 1 tablet (50 mg total) by mouth daily. 05/30/15  Yes [name], MD  mirabegron ER [facility]) 50  MG TB24 tablet Take 1 tablet (50 mg total) by mouth daily. 06/20/15  Yes Ardis Hughs, MD  mirtazapine (REMERON) 15 MG tablet TAKE 1/2 TABLET (7.5 MG TOTAL) BY MOUTH DAILY AFTER SUPPER. 05/10/15  Yes [name], MD  mirtazapine (REMERON) 7.5 MG tablet TAKE 1 TABLET (7.5 MG TOTAL) BY MOUTH AT BEDTIME. 05/16/15  Yes [name], MD  nitroGLYCERIN (NITROLINGUAL) 0.4 MG/SPRAY spray Place 1 spray under the tongue every 5 (five) minutes x 3 doses as needed for chest pain. 04/14/15  Yes [name], MD  Omega-3 Fatty Acids (FISH OIL) 1000 MG CAPS Take by mouth daily.   Yes Historical Provider, MD  pantoprazole (PROTONIX) 40 MG tablet TAKE 1 TABLET (40 MG TOTAL) BY MOUTH DAILY. 02/20/15  Yes Rubbie Battiest, NP   solifenacin (VESICARE) 10 MG tablet Take 1 tablet (10 mg total) by mouth daily. 05/10/15  Yes Collier Flowers, MD  vitamin C (ASCORBIC ACID) 500 MG tablet Take 500 mg by mouth daily.   Yes Historical Provider, MD  predniSONE (DELTASONE) 5 MG tablet Take 5 mg by mouth daily with breakfast. Reported on 08/31/2015    Historical Provider, MD     Family History  Problem Relation Age of Onset  . Heart disease Mother   . Heart disease Father   . Diabetes Brother   . Kidney disease Brother   . Stroke Daughter     due to medication reaction  . Prostate cancer Neg Hx   . Hematuria Neg Hx     Social History   Social History  . Marital Status: Widowed    Spouse Name: N/A  . Number of Children: 4  . Years of Education: 12   Occupational History  . [employer]     Retired  . [employer]     Retired   Social History Main Topics  . Smoking status: Former Smoker -- 0.50 packs/day for 30 years    Types: Cigarettes    Quit date: 12/03/2012  . Smokeless tobacco: Never Used  . Alcohol Use: No  . Drug Use: No  . Sexual Activity: No   Other Topics Concern  . None   Social History Narrative   Ms. Cerros was born and reared in [place]. She is a widow since 76. She was married for 48 years. They had 4 children (daughters). She is currently leaving at [facility] since June. 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 She enjoys shopping. She also enjoys music, word puzzles and she loves spending time with her family. She loves attending church. One of her favorite things is wearing hats. She absolutely loves hats!     Review of Systems: A 12 point ROS discussed and pertinent positives are indicated in the HPI above.  All other systems are negative.  Review of Systems  Vital Signs: BP 123/76 mmHg  Pulse 74  Temp(Src) 98.3 F (36.8 C) (Oral)  Resp 8  SpO2 100%  Physical Exam   Constitutional: She is oriented to person, place, and time. She appears well-developed and well-nourished. No distress.  Cardiovascular: Normal rate and regular rhythm.   No murmur heard. Pulmonary/Chest: Effort normal and breath sounds normal. She has no wheezes.  Abdominal: Soft. Bowel sounds are normal.  Neurological: She is alert and oriented to person, place, and time.  Skin: Skin is dry. She is not diaphoretic. No  erythema. No pallor.     Mallampati Score:  2  Imaging: No results found.  Labs:  CBC:  Recent Labs  05/12/15 0947 07/11/15 1016 08/22/15 0953 08/31/15 0839  WBC 2.1 Repeated and verified X2.* 1.9 Repeated and verified X2.* 2.6* 2.0*  HGB 11.7* 11.9* 12.1 12.3  HCT 34.7* 36.3 36.8 36.8  PLT 123.0* 120.0* 82* 78*    COAGS: No results for input(s): INR, APTT in the last 8760 hours.  BMP:  Recent Labs  12/20/14 1016 01/24/15 0926 04/14/15 0956 06/16/15 0855 07/11/15 1016  NA 139 139 141  --  141  K 4.2 3.9 3.8  --  3.9  CL 104 102 104  --  105  CO2 '27 28 30  '$ --  29  GLUCOSE 77 71 65*  --  133*  BUN '19 22 19  '$ --  19  CALCIUM 9.9 9.7 9.7  --  9.3  CREATININE 0.74 0.85 1.06 0.90 0.85    LIVER FUNCTION TESTS:  Recent Labs  04/14/15 0956 07/11/15 1016  BILITOT 0.4 0.4  AST 37 42*  ALT 22 23  ALKPHOS 54 54  PROT 7.5 7.3  ALBUMIN 4.3 4.0    TUMOR MARKERS: No results for input(s): AFPTM, CEA, CA199, CHROMGRNA in the last 8760 hours.  Assessment and Plan:  Neutropenia and anemia.  Consent obtained for CT BM asp and core bx, incluidng risk of pain, and bleeding.  Thank you for this interesting consult.  I greatly enjoyed meeting Caroline Nichols and look forward to participating in their care.  A copy of this report was sent to the requesting provider on this date.  Electronically Signed: Greggory Keen 08/31/2015, 9:42 AM   I spent a total of  30 Minutes   in face to face in clinical consultation, greater than 50% of which was  counseling/coordinating care for this patient with neutropenia.

## 2015-08-31 NOTE — Discharge Instructions (Signed)
Bone Marrow Aspiration and Bone Marrow Biopsy, Care After °Refer to this sheet in the next few weeks. These instructions provide you with information about caring for yourself after your procedure. Your health care provider may also give you more specific instructions. Your treatment has been planned according to current medical practices, but problems sometimes occur. Call your health care provider if you have any problems or questions after your procedure. °WHAT TO EXPECT AFTER THE PROCEDURE °After your procedure, it is common to have: °· Soreness or tenderness around the puncture site. °· Bruising. °HOME CARE INSTRUCTIONS °· Take medicines only as directed by your health care provider. °· Follow your health care provider's instructions about: °¨ Puncture site care. °¨ Bandage (dressing) changes and removal. °· Bathe and shower as directed by your health care provider. °· Check your puncture site every day for signs of infection. Watch for: °¨ Redness, swelling, or pain. °¨ Fluid, blood, or pus. °· Return to your normal activities as directed by your health care provider. °· Keep all follow-up visits as directed by your health care provider. This is important. °SEEK MEDICAL CARE IF: °· You have a fever. °· You have uncontrollable bleeding. °· You have redness, swelling, or pain at the site of your puncture. °· You have fluid, blood, or pus coming from your puncture site. °  °This information is not intended to replace advice given to you by your health care provider. Make sure you discuss any questions you have with your health care provider. °  °Document Released: 02/08/2005 Document Revised: 12/06/2014 Document Reviewed: 07/13/2014 °Elsevier Interactive Patient Education ©2016 [employer] ° °

## 2015-08-31 NOTE — Procedures (Signed)
S/p  CT guided BM asp and core bx No comp Stable  EBL 0 Path pending Full report in pacs

## 2015-09-01 ENCOUNTER — Ambulatory Visit: Payer: PPO | Admitting: Obstetrics and Gynecology

## 2015-09-06 ENCOUNTER — Other Ambulatory Visit: Payer: Self-pay | Admitting: Family Medicine

## 2015-09-08 ENCOUNTER — Ambulatory Visit: Payer: PPO | Admitting: Obstetrics and Gynecology

## 2015-09-14 ENCOUNTER — Other Ambulatory Visit: Payer: PPO

## 2015-09-14 ENCOUNTER — Ambulatory Visit: Payer: PPO

## 2015-09-15 ENCOUNTER — Inpatient Hospital Stay: Payer: PPO | Attending: Internal Medicine

## 2015-09-15 ENCOUNTER — Ambulatory Visit: Payer: PPO | Admitting: Obstetrics and Gynecology

## 2015-09-15 ENCOUNTER — Inpatient Hospital Stay (HOSPITAL_BASED_OUTPATIENT_CLINIC_OR_DEPARTMENT_OTHER): Payer: PPO | Admitting: Internal Medicine

## 2015-09-15 VITALS — BP 152/92 | HR 80 | Temp 95.7°F | Resp 18 | Ht 62.0 in | Wt 137.1 lb

## 2015-09-15 DIAGNOSIS — M25561 Pain in right knee: Secondary | ICD-10-CM | POA: Diagnosis not present

## 2015-09-15 DIAGNOSIS — Z79899 Other long term (current) drug therapy: Secondary | ICD-10-CM | POA: Insufficient documentation

## 2015-09-15 DIAGNOSIS — E785 Hyperlipidemia, unspecified: Secondary | ICD-10-CM | POA: Insufficient documentation

## 2015-09-15 DIAGNOSIS — D61818 Other pancytopenia: Secondary | ICD-10-CM

## 2015-09-15 DIAGNOSIS — Z87891 Personal history of nicotine dependence: Secondary | ICD-10-CM

## 2015-09-15 DIAGNOSIS — M1990 Unspecified osteoarthritis, unspecified site: Secondary | ICD-10-CM

## 2015-09-15 DIAGNOSIS — I10 Essential (primary) hypertension: Secondary | ICD-10-CM | POA: Insufficient documentation

## 2015-09-15 DIAGNOSIS — K589 Irritable bowel syndrome without diarrhea: Secondary | ICD-10-CM | POA: Insufficient documentation

## 2015-09-15 DIAGNOSIS — M069 Rheumatoid arthritis, unspecified: Secondary | ICD-10-CM | POA: Diagnosis not present

## 2015-09-15 DIAGNOSIS — M25562 Pain in left knee: Secondary | ICD-10-CM

## 2015-09-15 DIAGNOSIS — D709 Neutropenia, unspecified: Secondary | ICD-10-CM

## 2015-09-15 LAB — CBC WITH DIFFERENTIAL/PLATELET
Basophils Absolute: 0 10*3/uL (ref 0–0.1)
Basophils Relative: 1 %
EOS ABS: 0 10*3/uL (ref 0–0.7)
EOS PCT: 0 %
HCT: 36.3 % (ref 35.0–47.0)
Hemoglobin: 12.4 g/dL (ref 12.0–16.0)
LYMPHS ABS: 0.9 10*3/uL — AB (ref 1.0–3.6)
LYMPHS PCT: 24 %
MCH: 29.6 pg (ref 26.0–34.0)
MCHC: 34 g/dL (ref 32.0–36.0)
MCV: 87.1 fL (ref 80.0–100.0)
MONO ABS: 1.5 10*3/uL — AB (ref 0.2–0.9)
MONOS PCT: 42 %
Neutro Abs: 1.2 10*3/uL — ABNORMAL LOW (ref 1.4–6.5)
Neutrophils Relative %: 33 %
PLATELETS: 126 10*3/uL — AB (ref 150–440)
RBC: 4.17 MIL/uL (ref 3.80–5.20)
RDW: 17 % — ABNORMAL HIGH (ref 11.5–14.5)
WBC: 3.6 10*3/uL (ref 3.6–11.0)

## 2015-09-15 NOTE — Progress Notes (Signed)
Pt feels a little nervous today waiting on Bone Marrow results.  Pt reports she is a nervous person in general and does not need any further referral for care.  RN provided emotional support no further concerns.

## 2015-09-15 NOTE — Progress Notes (Signed)
Caroline Nichols @ [facility] [phone]  [phone]     Caroline Nichols OB: Aug 13, 1940  MR#: [id number]  HWK#:[id number]  Patient Care Team: [name], MD as PCP - General (Internal Medicine) [name], MD (Internal Medicine)  CHIEF COMPLAINT:  Chief Complaint  Patient presents with  . Follow-up    Follow up for results     No history exists.    Oncology Flowsheet 01/01/2013 01/02/2013 01/02/2013 01/02/2013 01/03/2013 01/03/2013 01/04/2013  ALPRAZolam (XANAX) PO 0.5 mg 0.5 mg 0.5 mg 0.5 mg 0.5 mg 0.5 mg 0.5 mg  enoxaparin (LOVENOX) Startex - 40 mg     40 mg   40 mg  ondansetron (ZOFRAN) IV - - - - 4 mg   -  ondansetron (ZOFRAN) PO   - - - - - -    HISTORY OF PRESENT ILLNESS:   Caroline Nichols is referred to our clinic for evaluation of chronic moderate pancytopenia. She has a history of rheumatoid arthritis, for which she initially was treated with methotrexate, and since December 2014, with Plaquenil. According to Dr. Jefm Bryant the patient had had cytopenias even prior to initiation of Plaquenil, but it is not clear whether mild neutropenia which we can see on medical records from 2014 was detected while she was on methotrexate. Vitamin B12 and folate levels were normal when measured in 2014. She didn't have any history of recurrent infections. Her arthritis has been well controlled on blood continue with occasional pain in small joints as well as both knees.    Current status:  Caroline Nichols returns to our clinic for follow-up visit. She completed a 5 day course of prednisone, and is currently on hydroxychloroquine again. She claims that her symptoms have improved, but not to the baseline. She did not have any infectious complications over the past months. She denies any bleeding.  She denies fevers, weight loss, night sweats, nausea, vomiting, diarrhea, constipation.  REVIEW OF SYSTEMS:   Review of Systems  All other systems reviewed and are negative.    PAST MEDICAL  HISTORY: Past Medical History  Diagnosis Date  . Hypertension   . Hyperthyroidism     s/p radiation therapy, now hypothyroidism  . IBS (irritable bowel syndrome)   . Herniated disc   . Pancreatic cyst   . Arthritis     Possible RA - Follow up appt with Dr. Jefm Bryant  . H/O transfusion of packed red blood cells 02/2013    3 units PRBC for severe anemia 02/2013  . Anemia 02/2013    F/U with Dr. Grayland Ormond for Feraheme injections  . Hyperlipidemia   . Allergy   . History of kidney stones   . Migraine   . Cellulitis of arm, right   . Paroxysmal a-fib ([place])   . Diabetes insipidus ([place])     On desmopressin    PAST SURGICAL HISTORY: Past Surgical History  Procedure Laterality Date  . Tubal ligation    . Cervical spine surgery      Bone fusion  . Hammer toe surgery    . Hernia repair    . Abdominal hysterectomy    . Breast surgery  1990s    Biopsy  . Breast excisional biopsy Right 2005    neg    FAMILY HISTORY Family History  Problem Relation Age of Onset  . Heart disease Mother   . Heart disease Father   . Diabetes Brother   . Kidney disease Brother   . Stroke Daughter  due to medication reaction  . Prostate cancer Neg Hx   . Hematuria Neg Hx     ADVANCED DIRECTIVES:  No flowsheet data found.  HEALTH MAINTENANCE: Social History  Substance Use Topics  . Smoking status: Former Smoker -- 0.50 packs/day for 30 years    Types: Cigarettes    Quit date: 12/03/2012  . Smokeless tobacco: Never Used  . Alcohol Use: No     Allergies  Allergen Reactions  . Codeine     REACTION: NAUSEA  . Tetanus Toxoid     Other reaction(s): Unknown REACTION: ARM SWELLING,REDNESS  . Tetanus Toxoids   . Tramadol Nausea And Vomiting    Current Outpatient Prescriptions  Medication Sig Dispense Refill  . acetaminophen (TYLENOL) 325 MG tablet Take 2 tablets (650 mg total) by mouth every 6 (six) hours as needed for pain.    Marland Kitchen ALPRAZolam (XANAX) 0.5 MG tablet Take 1 tablet (0.5 mg  total) by mouth 2 (two) times daily as needed for anxiety. 60 tablet 0  . brimonidine (ALPHAGAN) 0.2 % ophthalmic solution Place 1 drop into both eyes 2 (two) times daily.    . Calcium-Vitamin D (SUPER CALCIUM/D) 600-125 MG-UNIT TABS Take by mouth. Taking 2 daily    . Cholecalciferol (D3-1000 PO) Take by mouth daily. Reported on 07/28/2015    . ferrous sulfate 325 (65 FE) MG tablet Take 325 mg by mouth daily with breakfast.    . fluticasone (FLONASE) 50 MCG/ACT nasal spray Place 2 sprays into both nostrils daily. 16 g 6  . gabapentin (NEURONTIN) 300 MG capsule TAKE 1 CAPSULE (300 MG TOTAL) BY MOUTH 3 (THREE) TIMES DAILY. 270 capsule 0  . hydrALAZINE (APRESOLINE) 10 MG tablet Take 1 tablet (10 mg total) by mouth 2 (two) times daily. 60 tablet 5  . hydroxychloroquine (PLAQUENIL) 200 MG tablet Take 2 tablets (400 mg total) by mouth daily. 60 tablet 6  . ipratropium (ATROVENT) 0.03 % nasal spray Reported on 07/28/2015    . levothyroxine (SYNTHROID, LEVOTHROID) 112 MCG tablet TAKE 1 TABLET (112 MCG TOTAL) BY MOUTH DAILY BEFORE BREAKFAST. 90 tablet 1  . losartan (COZAAR) 50 MG tablet Take 1 tablet (50 mg total) by mouth daily. 90 tablet 3  . mirabegron ER (MYRBETRIQ) 50 MG TB24 tablet Take 1 tablet (50 mg total) by mouth daily. 30 tablet 2  . mirtazapine (REMERON) 15 MG tablet TAKE 1/2 TABLET (7.5 MG TOTAL) BY MOUTH DAILY AFTER SUPPER. 30 tablet 1  . mirtazapine (REMERON) 7.5 MG tablet TAKE 1 TABLET (7.5 MG TOTAL) BY MOUTH AT BEDTIME. 30 tablet 0  . nitroGLYCERIN (NITROLINGUAL) 0.4 MG/SPRAY spray Place 1 spray under the tongue every 5 (five) minutes x 3 doses as needed for chest pain. 4.9 g 11  . Omega-3 Fatty Acids (FISH OIL) 1000 MG CAPS Take by mouth daily.    . pantoprazole (PROTONIX) 40 MG tablet TAKE 1 TABLET (40 MG TOTAL) BY MOUTH DAILY. 30 tablet 4  . vitamin C (ASCORBIC ACID) 500 MG tablet Take 500 mg by mouth daily.     No current facility-administered medications for this visit.     OBJECTIVE:  Filed Vitals:   09/15/15 1103  BP: 152/92  Pulse: 80  Temp: 95.7 F (35.4 C)  Resp: 18     Body mass index is 25.07 kg/(m^2).    ECOG FS:1 - Symptomatic but completely ambulatory  Physical Exam  Constitutional: She is oriented to person, place, and time and well-developed, well-nourished, and in no distress. No distress.  Elderly African-American female  HENT:  Head: Normocephalic and atraumatic.  Right Ear: External ear normal.  Left Ear: External ear normal.  Mouth/Throat: Oropharynx is clear and moist.  Eyes: Conjunctivae are normal. Pupils are equal, round, and reactive to light. Right eye exhibits no discharge. Left eye exhibits no discharge. No scleral icterus.  Neck: Normal range of motion. Neck supple. No JVD present. No tracheal deviation present. No thyromegaly present.  Cardiovascular: Normal rate, regular rhythm, normal heart sounds and intact distal pulses.  Exam reveals no gallop and no friction rub.   No murmur heard. Pulmonary/Chest: Effort normal and breath sounds normal. No stridor. No respiratory distress. She has no wheezes. She has no rales. She exhibits no tenderness.  Abdominal: Soft. Bowel sounds are normal. She exhibits no distension and no mass. There is no tenderness. There is no rebound and no guarding.  Genitourinary:  Postponed  Musculoskeletal: Normal range of motion. She exhibits no edema or tenderness.  Bilateral finger joint deformities without erythema or edema  Lymphadenopathy:    She has no cervical adenopathy.  Neurological: She is alert and oriented to person, place, and time. She has normal reflexes. No cranial nerve deficit. She exhibits normal muscle tone. Gait normal. Coordination normal. GCS score is 15.  Skin: Skin is warm. No rash noted. She is not diaphoretic. No erythema. No pallor.  Psychiatric: Mood, memory, affect and judgment normal.  Nursing note and vitals reviewed.    LAB RESULTS:  Recent Results (from  the past 2160 hour(s))  Urinalysis, Complete     Status: Abnormal   Collection Time: 06/20/15  9:25 AM  Result Value Ref Range   Specific Gravity, UA 1.010 1.005 - 1.030   pH, UA 5.5 5.0 - 7.5   Color, UA Yellow Yellow   Appearance Ur Clear Clear   Leukocytes, UA Negative Negative   Protein, UA Negative Negative/Trace   Glucose, UA Negative Negative   Ketones, UA Negative Negative   RBC, UA 1+ (A) Negative   Bilirubin, UA Negative Negative   Urobilinogen, Ur 0.2 0.2 - 1.0 mg/dL   Nitrite, UA Negative Negative   Microscopic Examination See below:   Microscopic Examination     Status: Abnormal   Collection Time: 06/20/15  9:25 AM  Result Value Ref Range   WBC, UA None seen 0 -  5 /hpf   RBC, UA 0-2 0 -  2 /hpf   Epithelial Cells (non renal) 0-10 0 - 10 /hpf   Mucus, UA Present (A) Not Estab.   Bacteria, UA Few (A) None seen/Few  PTNS-Percutaneous Tibial Nerve Stimulati     Status: None   Collection Time: 07/07/15  9:51 AM  Result Value Ref Range   Scan Result 3   VITAMIN D 25 Hydroxy (Vit-D Deficiency, Fractures)     Status: Abnormal   Collection Time: 07/11/15 10:16 AM  Result Value Ref Range   VITD 26.14 (L) 30.00 - 100.00 ng/mL  Comprehensive metabolic panel     Status: Abnormal   Collection Time: 07/11/15 10:16 AM  Result Value Ref Range   Sodium 141 135 - 145 mEq/L   Potassium 3.9 3.5 - 5.1 mEq/L   Chloride 105 96 - 112 mEq/L   CO2 29 19 - 32 mEq/L   Glucose, Bld 133 (H) 70 - 99 mg/dL   BUN 19 6 - 23 mg/dL   Creatinine, Ser 0.85 0.40 - 1.20 mg/dL   Total Bilirubin 0.4 0.2 - 1.2 mg/dL   Alkaline Phosphatase  54 39 - 117 U/L   AST 42 (H) 0 - 37 U/L   ALT 23 0 - 35 U/L   Total Protein 7.3 6.0 - 8.3 g/dL   Albumin 4.0 3.5 - 5.2 g/dL   Calcium 9.3 8.4 - 10.5 mg/dL   GFR 84.08 >60.00 mL/min  TSH     Status: None   Collection Time: 07/11/15 10:16 AM  Result Value Ref Range   TSH 1.63 0.35 - 4.50 uIU/mL  CBC with Differential/Platelet     Status: Abnormal    Collection Time: 07/11/15 10:16 AM  Result Value Ref Range   WBC 1.9 Repeated and verified X2. (LL) 4.0 - 10.5 K/uL   RBC 4.15 3.87 - 5.11 Mil/uL   Hemoglobin 11.9 (L) 12.0 - 15.0 g/dL   HCT 36.3 36.0 - 46.0 %   MCV 87.5 78.0 - 100.0 fl   MCHC 32.8 30.0 - 36.0 g/dL   RDW 16.5 (H) 11.5 - 15.5 %   Platelets 120.0 (L) 150.0 - 400.0 K/uL   Neutrophils Relative % 32.3 (L) 43.0 - 77.0 %   Lymphocytes Relative 38.9 12.0 - 46.0 %   Monocytes Relative 27.3 (H) 3.0 - 12.0 %   Eosinophils Relative 0.8 0.0 - 5.0 %   Basophils Relative 0.7 0.0 - 3.0 %   Neutro Abs 0.6 (L) 1.4 - 7.7 K/uL   Lymphs Abs 0.8 0.7 - 4.0 K/uL   Monocytes Absolute 0.5 0.1 - 1.0 K/uL   Eosinophils Absolute 0.0 0.0 - 0.7 K/uL   Basophils Absolute 0.0 0.0 - 0.1 K/uL  PTNS-Percutaneous Tibial Nerve Stimulati     Status: None   Collection Time: 07/14/15 10:02 AM  Result Value Ref Range   Scan Result 3   PTNS-Percutaneous Tibial Nerve Stimulati     Status: None   Collection Time: 07/21/15  2:06 PM  Result Value Ref Range   Scan Result 2   PTNS-Percutaneous Tibial Nerve Stimulati     Status: None   Collection Time: 07/28/15 12:06 PM  Result Value Ref Range   Scan Result 7   PTNS-Percutaneous Tibial Nerve Stimulati     Status: None   Collection Time: 08/04/15  9:55 AM  Result Value Ref Range   Scan Result 1   PTNS-Percutaneous Tibial Nerve Stimulati     Status: None   Collection Time: 08/11/15  9:57 AM  Result Value Ref Range   Scan Result 5   CBC with Differential/Platelet     Status: Abnormal   Collection Time: 08/22/15  9:53 AM  Result Value Ref Range   WBC 2.6 (L) 3.6 - 11.0 K/uL   RBC 4.22 3.80 - 5.20 MIL/uL   Hemoglobin 12.1 12.0 - 16.0 g/dL   HCT 36.8 35.0 - 47.0 %   MCV 87.1 80.0 - 100.0 fL   MCH 28.6 26.0 - 34.0 pg   MCHC 32.9 32.0 - 36.0 g/dL   RDW 16.9 (H) 11.5 - 14.5 %   Platelets 82 (L) 150 - 440 K/uL   Neutrophils Relative % 42 %   Neutro Abs 1.1 (L) 1.4 - 6.5 K/uL   Lymphocytes Relative 37 %    Lymphs Abs 1.0 1.0 - 3.6 K/uL   Monocytes Relative 20 %   Monocytes Absolute 0.5 0.2 - 0.9 K/uL   Eosinophils Relative 0 %   Eosinophils Absolute 0.0 0 - 0.7 K/uL   Basophils Relative 1 %   Basophils Absolute 0.0 0 - 0.1 K/uL  CBC upon arrival  Status: Abnormal   Collection Time: 08/31/15  8:39 AM  Result Value Ref Range   WBC 2.0 (L) 3.6 - 11.0 K/uL   RBC 4.18 3.80 - 5.20 MIL/uL   Hemoglobin 12.3 12.0 - 16.0 g/dL   HCT 36.8 35.0 - 47.0 %   MCV 88.0 80.0 - 100.0 fL   MCH 29.3 26.0 - 34.0 pg   MCHC 33.3 32.0 - 36.0 g/dL   RDW 16.9 (H) 11.5 - 14.5 %   Platelets 78 (L) 150 - 440 K/uL  Differential     Status: Abnormal   Collection Time: 08/31/15  8:39 AM  Result Value Ref Range   Neutrophils Relative % 36 %   Lymphocytes Relative 53 %   Monocytes Relative 11 %   Eosinophils Relative 0 %   Basophils Relative 0 %   Band Neutrophils 0 %   Metamyelocytes Relative 0 %   Myelocytes 0 %   Promyelocytes Absolute 0 %   Blasts 0 %   nRBC 0 0 /100 WBC   Other 0 %   Neutro Abs 0.7 (L) 1.4 - 6.5 K/uL   Lymphs Abs 1.1 1.0 - 3.6 K/uL   Monocytes Absolute 0.2 0.2 - 0.9 K/uL   Eosinophils Absolute 0.0 0 - 0.7 K/uL   Basophils Absolute 0.0 0 - 0.1 K/uL   Smear Review MORPHOLOGY UNREMARKABLE   CBC with Differential     Status: Abnormal   Collection Time: 09/15/15 10:20 AM  Result Value Ref Range   WBC 3.6 3.6 - 11.0 K/uL   RBC 4.17 3.80 - 5.20 MIL/uL   Hemoglobin 12.4 12.0 - 16.0 g/dL   HCT 36.3 35.0 - 47.0 %   MCV 87.1 80.0 - 100.0 fL   MCH 29.6 26.0 - 34.0 pg   MCHC 34.0 32.0 - 36.0 g/dL   RDW 17.0 (H) 11.5 - 14.5 %   Platelets 126 (L) 150 - 440 K/uL   Neutrophils Relative % 33 %   Neutro Abs 1.2 (L) 1.4 - 6.5 K/uL   Lymphocytes Relative 24 %   Lymphs Abs 0.9 (L) 1.0 - 3.6 K/uL   Monocytes Relative 42 %   Monocytes Absolute 1.5 (H) 0.2 - 0.9 K/uL   Eosinophils Relative 0 %   Eosinophils Absolute 0.0 0 - 0.7 K/uL   Basophils Relative 1 %   Basophils Absolute 0.0 0 - 0.1  K/uL     Appointment on 09/15/2015  Component Date Value Ref Range Status  . WBC 09/15/2015 3.6  3.6 - 11.0 K/uL Final  . RBC 09/15/2015 4.17  3.80 - 5.20 MIL/uL Final  . Hemoglobin 09/15/2015 12.4  12.0 - 16.0 g/dL Final  . HCT 09/15/2015 36.3  35.0 - 47.0 % Final  . MCV 09/15/2015 87.1  80.0 - 100.0 fL Final  . MCH 09/15/2015 29.6  26.0 - 34.0 pg Final  . MCHC 09/15/2015 34.0  32.0 - 36.0 g/dL Final  . RDW 09/15/2015 17.0* 11.5 - 14.5 % Final  . Platelets 09/15/2015 126* 150 - 440 K/uL Final  . Neutrophils Relative % 09/15/2015 33   Final  . Neutro Abs 09/15/2015 1.2* 1.4 - 6.5 K/uL Final  . Lymphocytes Relative 09/15/2015 24   Final  . Lymphs Abs 09/15/2015 0.9* 1.0 - 3.6 K/uL Final  . Monocytes Relative 09/15/2015 42   Final  . Monocytes Absolute 09/15/2015 1.5* 0.2 - 0.9 K/uL Final  . Eosinophils Relative 09/15/2015 0   Final  . Eosinophils Absolute 09/15/2015 0.0  0 -  0.7 K/uL Final  . Basophils Relative 09/15/2015 1   Final  . Basophils Absolute 09/15/2015 0.0  0 - 0.1 K/uL Final       STUDIES: Ct Biopsy  08/31/2015  CLINICAL DATA:  Absolute neutropenia, anemia, evaluate for myelodysplastic syndrome EXAM: CT GUIDED RIGHT ILIAC BONE MARROW ASPIRATION AND CORE BIOPSY Date:  1/26/20171/26/2017 9:52 am Radiologist:  M. Daryll Brod, MD Guidance:  CT FLUOROSCOPY TIME:  None. MEDICATIONS AND MEDICAL HISTORY: 2 mg Versed, 100 mcg fentanyl ANESTHESIA/SEDATION: 11 minutes, the patient's level of consciousness and physiological status was monitored by radiology nursing. CONTRAST:  None. COMPLICATIONS: None PROCEDURE: Informed consent was obtained from the patient following explanation of the procedure, risks, benefits and alternatives. The patient understands, agrees and consents for the procedure. All questions were addressed. A time out was performed. The patient was positioned prone and noncontrast localization CT was performed of the pelvis to demonstrate the iliac marrow spaces.  Maximal barrier sterile technique utilized including caps, mask, sterile gowns, sterile gloves, large sterile drape, hand hygiene, and betadine prep. Under sterile conditions and local anesthesia, an 11 gauge coaxial bone biopsy needle was advanced into the right iliac marrow space. Needle position was confirmed with CT imaging. Initially, bone marrow aspiration was performed. Next, the 11 gauge outer cannula was utilized to obtain a right iliac bone marrow core biopsy. Needle was removed. Hemostasis was obtained with compression. The patient tolerated the procedure well. Samples were prepared with the cytotechnologist. No immediate complications. IMPRESSION: CT guided right iliac bone marrow aspiration and core biopsy. Electronically Signed   By: Jerilynn Mages.  Shick M.D.   On: 08/31/2015 10:16    ASSESSMENT and MEDICAL DECISION MAKING:  Pancytopenia- bone marrow biopsy showed no evidence of myelodysplastic syndrome, plasma cell dyscrasia or any other form of hematologic malignancy. It is possible, that either autoimmune process, accompanying rheumatoid arthritis, or medication effect of hydroxychloroquine are responsible for blood count abnormalities. It appears, though, that both white blood cell count, including absolute neutrophil count, and platelet count have significantly improved since the last appointment. The increased percentage of monocytes also often indicate recovery of the bone marrow. As such, we will not pursue any additional evaluation, but rather will continue to monitor patient's condition. She will return to our clinic in 3 months, and at that point we will make a decision on whether to continue a follow-up. She will continue regular follow-up with our rheumatology colleagues. If needed, we will provide Mrs. Mijangos with Neupogen to maintain absolute neutrophil count at a safe level.   Patient expressed understanding and was in agreement with this plan. She also understands that She can call clinic  at any time with any questions, concerns, or complaints.    No matching staging information was found for the patient.  Roxana Hires, MD   09/15/2015 2:14 PM

## 2015-09-20 ENCOUNTER — Other Ambulatory Visit: Payer: Self-pay | Admitting: Internal Medicine

## 2015-09-20 ENCOUNTER — Ambulatory Visit: Payer: Medicare Other | Admitting: Urology

## 2015-09-22 ENCOUNTER — Ambulatory Visit: Payer: Medicare Other | Admitting: Urology

## 2015-09-22 ENCOUNTER — Telehealth: Payer: Self-pay | Admitting: Radiology

## 2015-09-22 NOTE — Telephone Encounter (Signed)
Pt states she can't afford copay for Myrbetriq. Is there another medication she could take? She is scheduled for an MRI on 2/22 & RTC 09/29/15. States Dr Louis Meckel prescribed the medication but her f/u is with Dr Erlene Quan. Please advise.

## 2015-09-25 NOTE — Telephone Encounter (Signed)
LMOM

## 2015-09-26 NOTE — Telephone Encounter (Signed)
Spoke with pt in reference to Myrbetriq. Made pt aware we can provide samples. Pt will pick samples up this afternoon.

## 2015-09-27 ENCOUNTER — Ambulatory Visit
Admission: RE | Admit: 2015-09-27 | Discharge: 2015-09-27 | Disposition: A | Discharge status: Home / Self Care | Payer: PPO | Source: Ambulatory Visit | Attending: Urology | Admitting: Urology

## 2015-09-27 DIAGNOSIS — N281 Cyst of kidney, acquired: Secondary | ICD-10-CM | POA: Diagnosis not present

## 2015-09-27 DIAGNOSIS — N289 Disorder of kidney and ureter, unspecified: Secondary | ICD-10-CM | POA: Diagnosis not present

## 2015-09-27 LAB — POCT I-STAT CREATININE: Creatinine, Ser: 0.9 mg/dL (ref 0.44–1.00)

## 2015-09-27 MED ORDER — GADOBENATE DIMEGLUMINE 529 MG/ML IV SOLN
15.0000 mL | Freq: Once | INTRAVENOUS | Status: AC | PRN
  Administered 2015-09-27: 12 mL via INTRAVENOUS

## 2015-09-29 ENCOUNTER — Ambulatory Visit (INDEPENDENT_AMBULATORY_CARE_PROVIDER_SITE_OTHER): Payer: PPO | Admitting: Urology

## 2015-09-29 ENCOUNTER — Encounter: Payer: Self-pay | Admitting: Urology

## 2015-09-29 VITALS — BP 168/89 | HR 80 | Ht 62.0 in | Wt 136.8 lb

## 2015-09-29 DIAGNOSIS — N2889 Other specified disorders of kidney and ureter: Secondary | ICD-10-CM

## 2015-09-29 DIAGNOSIS — N3281 Overactive bladder: Secondary | ICD-10-CM

## 2015-09-29 NOTE — Progress Notes (Signed)
09/29/2015 11:09 AM   [name] [birth year] [id number]  Referring provider: [name], MD [place], [place] [address]  Chief Complaint  Patient presents with  . Renal Mass    MRI Results    HPI: 75 year old female who presents today to discuss right lower pole enhancing renal mass.  Renal mass Incidentally identified 2.1 cm right lower pole enhancing renal mass. She returns today after undergoing further characterization of the mass with an MRI of the abdomen on 09/27/2015.  The study, the mass measures 2.6 cm with extensive internal enhancement highly suspicious for renal cell carcinoma.  No flank pain. No family history of RCC.  OAB/ nocturia Currently on Mybetriq. Was undergoing treatments with PTNS but insurance change and no unable to afford these treatments. Continues to have significant urinary daytime frequency and nocturia.  History of microscopic hematuria S/p CT urogram/ cystoscopy 06/2015.  Cysto negative. No gross hematuria.    PMH significant for S/p open hysterectomy, tubal and LLQ hernia.  She also has history of anemia.  Lives in assisted nursing facility but good functional status.  PMH: Past Medical History  Diagnosis Date  . Hypertension   . Hyperthyroidism     s/p radiation therapy, now hypothyroidism  . IBS (irritable bowel syndrome)   . Herniated disc   . Pancreatic cyst   . Arthritis     Possible RA - Follow up appt with Dr. Jefm Bryant  . H/O transfusion of packed red blood cells 02/2013    3 units PRBC for severe anemia 02/2013  . Anemia 02/2013    F/U with Dr. Grayland Ormond for Feraheme injections  . Hyperlipidemia   . Allergy   . History of kidney stones   . Migraine   . Cellulitis of arm, right   . Paroxysmal a-fib ([place])   . Diabetes insipidus ([place])     On desmopressin    Surgical History: Past Surgical History  Procedure Laterality Date  . Tubal ligation    . Cervical spine surgery      Bone fusion  .  Hammer toe surgery    . Hernia repair    . Abdominal hysterectomy    . Breast surgery  1990s    Biopsy  . Breast excisional biopsy Right 2005    neg    Home Medications:    Medication List       This list is accurate as of: 09/29/15 11:09 AM.  Always use your most recent med list.               acetaminophen 325 MG tablet  Commonly known as:  TYLENOL  Take 2 tablets (650 mg total) by mouth every 6 (six) hours as needed for pain.     ALPRAZolam 0.5 MG tablet  Commonly known as:  XANAX  Take 1 tablet (0.5 mg total) by mouth 2 (two) times daily as needed for anxiety.     brimonidine 0.2 % ophthalmic solution  Commonly known as:  ALPHAGAN  Place 1 drop into both eyes 2 (two) times daily.     D3-1000 PO  Take by mouth daily. Reported on 07/28/2015     ferrous sulfate 325 (65 FE) MG tablet  Take 325 mg by mouth daily with breakfast.     Fish Oil 1000 MG Caps  Take by mouth daily.     fluticasone 50 MCG/ACT nasal spray  Commonly known as:  FLONASE  Place 2 sprays into both nostrils daily.  gabapentin 300 MG capsule  Commonly known as:  NEURONTIN  TAKE 1 CAPSULE (300 MG TOTAL) BY MOUTH 3 (THREE) TIMES DAILY.     hydrALAZINE 10 MG tablet  Commonly known as:  APRESOLINE  Take 1 tablet (10 mg total) by mouth 2 (two) times daily.     hydrALAZINE 10 MG tablet  Commonly known as:  APRESOLINE  TAKE 1 TABLET (10 MG TOTAL) BY MOUTH 2 (TWO) TIMES DAILY.     hydroxychloroquine 200 MG tablet  Commonly known as:  PLAQUENIL  Take 2 tablets (400 mg total) by mouth daily.     ipratropium 0.03 % nasal spray  Commonly known as:  ATROVENT  Reported on 07/28/2015     levothyroxine 112 MCG tablet  Commonly known as:  SYNTHROID, LEVOTHROID  TAKE 1 TABLET (112 MCG TOTAL) BY MOUTH DAILY BEFORE BREAKFAST.     losartan 50 MG tablet  Commonly known as:  COZAAR  Take 1 tablet (50 mg total) by mouth daily.     mirabegron ER 50 MG Tb24 tablet  Commonly known as:  MYRBETRIQ    Take 1 tablet (50 mg total) by mouth daily.     mirtazapine 7.5 MG tablet  Commonly known as:  REMERON  TAKE 1 TABLET (7.5 MG TOTAL) BY MOUTH AT BEDTIME.     nitroGLYCERIN 0.4 MG/SPRAY spray  Commonly known as:  NITROLINGUAL  Place 1 spray under the tongue every 5 (five) minutes x 3 doses as needed for chest pain.     pantoprazole 40 MG tablet  Commonly known as:  PROTONIX  TAKE 1 TABLET (40 MG TOTAL) BY MOUTH DAILY.     SUPER CALCIUM/D 600-125 MG-UNIT Tabs  Generic drug:  Calcium-Vitamin D  Take by mouth. Taking 2 daily     vitamin C 500 MG tablet  Commonly known as:  ASCORBIC ACID  Take 500 mg by mouth daily.        Allergies:  Allergies  Allergen Reactions  . Codeine     REACTION: NAUSEA  . Tetanus Toxoid     Other reaction(s): Unknown REACTION: ARM SWELLING,REDNESS  . Tetanus Toxoids   . Tramadol Nausea And Vomiting    Family History: Family History  Problem Relation Age of Onset  . Heart disease Mother   . Heart disease Father   . Diabetes Brother   . Kidney disease Brother   . Stroke Daughter     due to medication reaction  . Prostate cancer Neg Hx   . Hematuria Neg Hx     Social History:  reports that she quit smoking about 2 years ago. Her smoking use included Cigarettes. She has a 15 pack-year smoking history. She has never used smokeless tobacco. She reports that she does not drink alcohol or use illicit drugs.  ROS: UROLOGY Frequent Urination?: Yes Hard to postpone urination?: No Burning/pain with urination?: No Get up at night to urinate?: Yes Leakage of urine?: No Urine stream starts and stops?: Yes Trouble starting stream?: Yes Do you have to strain to urinate?: No Blood in urine?: No Urinary tract infection?: No Sexually transmitted disease?: No Injury to kidneys or bladder?: No Painful intercourse?: No Weak stream?: Yes Currently pregnant?: No Vaginal bleeding?: No Last menstrual period?: n  Gastrointestinal Nausea?:  No Vomiting?: No Indigestion/heartburn?: Yes Diarrhea?: Yes Constipation?: No  Constitutional Fever: No Night sweats?: Yes Weight loss?: No Fatigue?: Yes  Skin Skin rash/lesions?: Yes Itching?: Yes  Eyes Blurred vision?: Yes Double vision?: No  Ears/Nose/Throat Sore  throat?: No Sinus problems?: Yes  Hematologic/Lymphatic Swollen glands?: No Easy bruising?: Yes  Cardiovascular Leg swelling?: No Chest pain?: No  Respiratory Cough?: No Shortness of breath?: Yes  Endocrine Excessive thirst?: Yes  Musculoskeletal Back pain?: Yes Joint pain?: Yes  Neurological Headaches?: Yes Dizziness?: No  Psychologic Depression?: No Anxiety?: Yes  Physical Exam: BP 168/89 mmHg  Pulse 80  Ht '5\' 2"'$  (1.575 m)  Wt 136 lb 12.8 oz (62.052 kg)  BMI 25.01 kg/m2  Constitutional:  Alert and oriented, No acute distress. HEENT: [place] AT, moist mucus membranes.  Trachea midline, no masses. Cardiovascular: No clubbing, cyanosis, or edema. CTAB. Respiratory: Normal respiratory effort, no increased work of breathing.  RRR. GI: Abdomen is soft, nontender, nondistended, no abdominal masses.  Abdominal incisions including transverse lower abdominal incision, lower quadrant incision noted.  Hernias. GU: No CVA tenderness.  Skin: No rashes, bruises or suspicious lesions. Lymph: No inguinal adenopathy. Neurologic: Grossly intact, no focal deficits, moving all 4 extremities. Psychiatric: Normal mood and affect.  Laboratory Data: Lab Results  Component Value Date   WBC 3.6 09/15/2015   HGB 12.4 09/15/2015   HCT 36.3 09/15/2015   MCV 87.1 09/15/2015   PLT 126* 09/15/2015    Lab Results  Component Value Date   CREATININE 0.90 09/27/2015    Lab Results  Component Value Date   HGBA1C 5.3 06/09/2014    Pertinent Imaging: CT/ MRI scan reviewed personally today by myself as well as with the patient.  Study Result     CLINICAL DATA: 75 year old female with history of  frequent urination. Suspicious right renal lesions noted on prior CT examination 06/16/2015.  EXAM: MRI ABDOMEN WITHOUT AND WITH CONTRAST  TECHNIQUE: Multiplanar multisequence MR imaging of the abdomen was performed both before and after the administration of intravenous contrast.  CONTRAST: 29m MULTIHANCE GADOBENATE DIMEGLUMINE 529 MG/ML IV SOLN  COMPARISON: CT of the abdomen and pelvis 06/16/2015.  FINDINGS: Comment: Study is significantly limited by a large amount of patient respiratory motion.  Lower chest: Unremarkable.  Hepatobiliary: No cystic or solid hepatic lesions. No intra or extrahepatic biliary ductal dilatation. The gallbladder is normal in appearance.  Pancreas: No pancreatic mass. No pancreatic ductal dilatation. No pancreatic or peripancreatic fluid or inflammatory changes.  Spleen: Unremarkable.  Adrenals/Urinary Tract: In the lower pole of the right kidney there is a 2.6 x 2.4 x 1.8 cm (image 37 of series 10 and coronal image 9 of series 2) lesion that is generally heterogeneously isointense on T1 weighted images, heterogeneously hyperintense on T2 weighted images, but demonstrates extensive internal enhancement on post gadolinium images, compatible with a solid renal neoplasm. This is well separate from the right renal vein which is widely patent at this time. The other lesion of concern in the medial aspect of the interpolar region of the right kidney measures 2.1 cm in diameter and is low T1 signal intensity, high T2 signal intensity, without definite internal enhancement (assessment is slightly limited by extensive patient motion), compatible with a simple cyst. Other sub cm lesions in the right kidney are low T1 signal intensity, high T2 signal intensity and do not enhance, compatible with tiny simple cysts. Left kidney is normal in appearance. No hydroureteronephrosis in the visualized abdomen. Bilateral adrenal glands are normal  in appearance.  Stomach/Bowel: Visualized portions are unremarkable.  Vascular/Lymphatic: Atherosclerosis in the visualized abdominal vasculature, without evidence of aneurysm. No lymphadenopathy noted in the abdomen.  Other: No significant volume of ascites in the visualized peritoneal cavity.  Musculoskeletal:  No aggressive osseous lesions are noted in the visualized portions of the skeleton.  IMPRESSION: 1. The lesion of concern in the lower pole of the right kidney has imaging characteristics highly suspicious for a small renal cell carcinoma. This appears encapsulated by Gerota's fascia and is separate from the right renal vein at this time. No associated lymphadenopathy. Urologic consultation is recommended. 2. Other simple cysts are also noted in the right kidney.   Electronically Signed  By: Vinnie Langton M.D.  On: 09/27/2015 10:45     Study Result     CLINICAL DATA: Frequent urination and pelvic pain for 1 year. Microscopic hematuria. Intermittent dysuria. Hysterectomy. Left inguinal hernia repair.  EXAM: CT ABDOMEN AND PELVIS WITHOUT AND WITH CONTRAST  TECHNIQUE: Multidetector CT imaging of the abdomen and pelvis was performed following the standard protocol before and following the bolus administration of intravenous contrast.  CONTRAST: 128m OMNIPAQUE IOHEXOL 350 MG/ML SOLN  COMPARISON: None.  FINDINGS: Lower chest: Bibasilar scarring. Cardiomegaly, accentuated by a pectus excavatum deformity.  Hepatobiliary: Mild motion degradation on the postcontrast images. Normal liver. Normal gallbladder, without biliary ductal dilatation.  Pancreas: Normal, without mass or ductal dilatation.  Spleen: Normal in size, without focal abnormality.  Adrenals/Urinary Tract: Normal adrenal glands. Favor renal vascular calcification of the medial interpolar right kidney at 4 mm (image 29, series 2). Otherwise, no renal calculi. No  hydronephrosis. Vascular calcifications. No hydroureter or convincing evidence of ureteric stone.  Too small to characterize lesions in both kidneys. An interpolar right renal lesion measures 1.9 cm. Equivocal post-contrast enhancement, 5 HU prior to contrast and 26 HU after contrast (image 18, series 4). A lower pole right renal lesion measures 2.1 cm (image 28, series 4). This is complex, with apparent enhancing areas including on delayed image 33. Best evaluated on delayed coronal imaging (image 45, series 14).  No suspicious left-sided lesion identified. Moderate to good renal collecting system opacification on delayed images. Good ureteric opacification, other than the distal left most ureter. No filling defect. No enhancing bladder mass or filling defect on delayed images.  Stomach/Bowel: Normal stomach, without wall thickening. Scattered colonic diverticula. Normal small bowel.  Vascular/Lymphatic: Advanced aortic and branch vessel atherosclerosis. Patent right renal vein. No retroperitoneal or retrocrural adenopathy. No pelvic adenopathy.  Reproductive: Hysterectomy. No adnexal mass.  Other: No pleural fluid. Left inguinal hernia repair.  Musculoskeletal: Advanced degenerative disc disease, including at L3-4 through L5-S1.  IMPRESSION: 1. Motion degraded images, especially the portal venous phase post-contrast series. 2. Right lower pole renal lesion is suspicious for a solid neoplasm, likely renal cell carcinoma. 3. An interpolar right renal lesion is favored to represent a cyst or minimally complex cyst, but suboptimally evaluated secondary to motion. 4. No evidence of metastatic disease. 5. Given motion on the current exam, consider confirmation with pre and post contrast abdominal MRI. This will also allow definitive characterization of the interpolar right renal lesion. These results will be called to the ordering clinician or representative by the  Radiologist Assistant, and communication documented in the PACS or zVision Dashboard.   Electronically Signed  By: KAbigail MiyamotoM.D.  On: 06/16/2015 10:11     Assessment & Plan:    1. Right renal mass 2.6 cm right lower pole exophytic enhancing solid renal neoplasm.  A solid renal mass raises the suspicion of primary renal malignancy.    We discussed this in detail and in regards to the spectrum of renal masses which includes cysts (pure cysts are considered benign), solid  masses and everything in between. The risk of metastasis increases as the size of solid renal mass increases. In general, it is believed that the risk of metastasis for renal masses less than 3-4 cm is small (up to approximately 5%) based mainly on large retrospective studies.  In some cases and especially in patients of older age and multiple comorbidities a surveillance approach may be appropriate. The treatment of solid renal masses includes: surveillance, cryoablation (percutaneous and laparoscopic) in addition to partial and complete nephrectomy (each with option of laparoscopic, robotic and open depending on appropriateness). Furthermore, nephrectomy appears to be an independent risk factor for the development of chronic kidney disease suggesting that nephron sparing approaches should be implored whenever feasible. We reviewed these options in context of the patients current situation as well as the pros and cons of each.  Given the size and location of the mass, do feel that it is feasible to proceed with right robotic partial nephrectomy. She is most interested in this option. Today, we discussed the preoperative, intraoperative, and postoperative course. Risks of surgery including bleeding with possible need for blood transfusion, damage to surrounding structures, infection, urinary leak, delayed bleeding (AV malformation), risk of general anesthesia, and more serious complications including MI, DVT, PE, and death  were discussed. She understands all of this and would like to proceed with right robotic partial nephrectomy. All of her questions were answered in detail today.   2. Overactive bladder Continue Mybetriq.   Schedule right robotic partial nephrectomy   Hollice Espy, MD  [facility] [address], [place], [place] [address] [phone]   I spent 30 min with this patient of which greater than 50% was spent in counseling and coordination of care with the patient.

## 2015-10-01 ENCOUNTER — Encounter: Payer: Self-pay | Admitting: Urology

## 2015-10-02 ENCOUNTER — Telehealth: Payer: Self-pay | Admitting: Radiology

## 2015-10-02 NOTE — Telephone Encounter (Signed)
Notified pt of surgery scheduled 10/23/15, pre-admit testing appt on 3/9 '@9'$ :45 & to call Friday prior to surgery for arrival time to SDS. Pt voices understanding.

## 2015-10-02 NOTE — Telephone Encounter (Signed)
LMOM. Need to notify pt of surgery information. 

## 2015-10-03 ENCOUNTER — Telehealth: Payer: Self-pay | Admitting: Internal Medicine

## 2015-10-03 DIAGNOSIS — N2889 Other specified disorders of kidney and ureter: Secondary | ICD-10-CM | POA: Insufficient documentation

## 2015-10-03 NOTE — Telephone Encounter (Signed)
Left message for patient to return call to office. 

## 2015-10-03 NOTE — Telephone Encounter (Signed)
yes

## 2015-10-03 NOTE — Telephone Encounter (Signed)
Patient has no transportation except Tues 8-4  Wed -Friday 8-12 you are booked up those hour we have no 30 min slots the only time I can find is on Tuesday if we could do a 12:30 next Tuesday?

## 2015-10-03 NOTE — Telephone Encounter (Signed)
Please schedule this patient on 10/10/15 at 12.30 per Dr. Derrel Nip for surgical clearance.

## 2015-10-03 NOTE — Telephone Encounter (Signed)
Pt called returning your call. Call pt @ [phone]. Thank you!

## 2015-10-03 NOTE — Telephone Encounter (Signed)
Please let her know that I am aware  That the urologist has notified me that he is planning to do a  Partial right nephrectomy  For her renal mass.  He may want surgical clearance.  So offer appointment

## 2015-10-10 ENCOUNTER — Ambulatory Visit (INDEPENDENT_AMBULATORY_CARE_PROVIDER_SITE_OTHER): Payer: PPO | Admitting: Internal Medicine

## 2015-10-10 ENCOUNTER — Encounter: Payer: Self-pay | Admitting: Internal Medicine

## 2015-10-10 VITALS — BP 178/98 | HR 77 | Temp 98.1°F | Resp 12 | Ht 62.0 in | Wt 137.8 lb

## 2015-10-10 DIAGNOSIS — R0989 Other specified symptoms and signs involving the circulatory and respiratory systems: Secondary | ICD-10-CM

## 2015-10-10 DIAGNOSIS — Z01818 Encounter for other preprocedural examination: Secondary | ICD-10-CM | POA: Diagnosis not present

## 2015-10-10 NOTE — Patient Instructions (Addendum)
You can increase your use of alprazolam to 1 mg at bedtime  (that's 2 tablets) and one during the day if you need it for your nerves.    You can increase the mirtazapine to 15 mg at bedtime if you feel too sad or are still having trouble sleeping

## 2015-10-10 NOTE — Progress Notes (Signed)
Pre-visit discussion using our clinic review tool. No additional management support is needed unless otherwise documented below in the visit note.  

## 2015-10-10 NOTE — Progress Notes (Signed)
Subjective:  Patient ID: Caroline Nichols, female    DOB: 1940-12-17  Age: 75 y.o. MRN: [id number]  CC: The primary encounter diagnosis was Preoperative evaluation of a medical condition to rule out surgical contraindications (TAR required). A diagnosis of Left carotid bruit was also pertinent to this visit.  HPI Caroline Nichols presents for surgical clearance .  Patient is tentavely scheduled for a partial right nephrectomy to remove a renal mass that was discovered during workup for microscopic hematuria with negative cystoscopy in  Dec 2016.    She is anxious and tearful today,  Not sleeping well despite using .5 mg alprazolam and 7.5 mg mirtazapine at bedtime.  The mirtazapine  has improved  her appetite.   Patient has history of postablative hypothyroidism with normal TSH in December 20`6, noncritical PAD.  No history of CAD arrhythmia, DM or CKD.  She reports no episdoes of chest pain since 2014.  Prior cardiac workup was negative for ischemia.   She has no history of CKD or DM.  Diabetes insipidus was ruled out last year with comprehensive Endocrinology workup.  She has a history of pancytopenia   And has been referred to Heme Onc.  Her bone marrow  Biopsy in early February 2017 was negative for MDS , dyscrasia or other malignancy , and her ounts are improving.  Has follow up in May .    Lab Results  Component Value Date   WBC 2.4* 10/12/2015   HGB 12.3 10/12/2015   HCT 37.0 10/12/2015   MCV 88.0 10/12/2015   PLT 111* 10/12/2015    Lab Results  Component Value Date   HGBA1C 5.3 06/09/2014   Lab Results  Component Value Date   CREATININE 0.76 10/12/2015        Outpatient Prescriptions Prior to Visit  Medication Sig Dispense Refill  . acetaminophen (TYLENOL) 325 MG tablet Take 2 tablets (650 mg total) by mouth every 6 (six) hours as needed for pain.    Marland Kitchen ALPRAZolam (XANAX) 0.5 MG tablet Take 1 tablet (0.5 mg total) by mouth 2 (two) times daily as needed for anxiety. 60  tablet 0  . brimonidine (ALPHAGAN) 0.2 % ophthalmic solution Place 1 drop into both eyes 2 (two) times daily.    . Calcium-Vitamin D (SUPER CALCIUM/D) 600-125 MG-UNIT TABS Take by mouth. Taking 2 daily    . Cholecalciferol (D3-1000 PO) Take 1,000 Units by mouth daily. Reported on 07/28/2015    . ferrous sulfate 325 (65 FE) MG tablet Take 325 mg by mouth daily with breakfast.    . fluticasone (FLONASE) 50 MCG/ACT nasal spray Place 2 sprays into both nostrils daily. 16 g 6  . gabapentin (NEURONTIN) 300 MG capsule TAKE 1 CAPSULE (300 MG TOTAL) BY MOUTH 3 (THREE) TIMES DAILY. 270 capsule 0  . hydrALAZINE (APRESOLINE) 10 MG tablet Take 1 tablet (10 mg total) by mouth 2 (two) times daily. 60 tablet 5  . hydrALAZINE (APRESOLINE) 10 MG tablet TAKE 1 TABLET (10 MG TOTAL) BY MOUTH 2 (TWO) TIMES DAILY. 60 tablet 3  . hydroxychloroquine (PLAQUENIL) 200 MG tablet Take 2 tablets (400 mg total) by mouth daily. 60 tablet 6  . ipratropium (ATROVENT) 0.03 % nasal spray Place 1 spray into both nostrils 2 (two) times daily. Reported on 07/28/2015    . levothyroxine (SYNTHROID, LEVOTHROID) 112 MCG tablet TAKE 1 TABLET (112 MCG TOTAL) BY MOUTH DAILY BEFORE BREAKFAST. 90 tablet 1  . losartan (COZAAR) 50 MG tablet Take 1 tablet (50 mg  total) by mouth daily. 90 tablet 3  . mirabegron ER (MYRBETRIQ) 50 MG TB24 tablet Take 1 tablet (50 mg total) by mouth daily. 30 tablet 2  . mirtazapine (REMERON) 7.5 MG tablet TAKE 1 TABLET (7.5 MG TOTAL) BY MOUTH AT BEDTIME. 30 tablet 0  . nitroGLYCERIN (NITROLINGUAL) 0.4 MG/SPRAY spray Place 1 spray under the tongue every 5 (five) minutes x 3 doses as needed for chest pain. 4.9 g 11  . Omega-3 Fatty Acids (FISH OIL) 1000 MG CAPS Take 1,000 mg by mouth daily.     . pantoprazole (PROTONIX) 40 MG tablet TAKE 1 TABLET (40 MG TOTAL) BY MOUTH DAILY. 30 tablet 4  . vitamin C (ASCORBIC ACID) 500 MG tablet Take 500 mg by mouth daily.     No facility-administered medications prior to visit.     Review of Systems;  Patient denies headache, fevers, malaise, unintentional weight loss, skin rash, eye pain, sinus congestion and sinus pain, sore throat, dysphagia,  hemoptysis , cough, dyspnea, wheezing, chest pain, palpitations, orthopnea, edema, abdominal pain, nausea, melena, diarrhea, constipation, flank pain, dysuria, hematuria, urinary  Frequency, nocturia, numbness, tingling, seizures,  Focal weakness, Loss of consciousness,  Tremor, insomnia, depression, anxiety, and suicidal ideation.      Objective:  BP 178/98 mmHg  Pulse 77  Temp(Src) 98.1 F (36.7 C) (Oral)  Resp 12  Ht '5\' 2"'$  (1.575 m)  Wt 137 lb 12 oz (62.483 kg)  BMI 25.19 kg/m2  SpO2 95%  BP Readings from Last 3 Encounters:  10/12/15 168/89  10/10/15 178/98  09/29/15 168/89    Wt Readings from Last 3 Encounters:  10/12/15 136 lb (61.689 kg)  10/10/15 137 lb 12 oz (62.483 kg)  09/29/15 136 lb 12.8 oz (62.052 kg)    General appearance: alert, cooperative and appears stated age Ears: normal TM's and external ear canals both ears Throat: lips, mucosa, and tongue normal; teeth and gums normal Neck: no adenopathy, no carotid bruit, supple, symmetrical, trachea midline and thyroid not enlarged, symmetric, no tenderness/mass/nodules Back: symmetric, no curvature. ROM normal. No CVA tenderness. Lungs: clear to auscultation bilaterally Heart: regular rate and rhythm, S1, S2 normal, no murmur, click, rub or gallop Abdomen: soft, non-tender; bowel sounds normal; no masses,  no organomegaly Pulses: 2+ and symmetric Skin: Skin color, texture, turgor normal. No rashes or lesions Lymph nodes: Cervical, supraclavicular, and axillary nodes normal.  Lab Results  Component Value Date   HGBA1C 5.3 06/09/2014   HGBA1C 5.2 12/27/2012    Lab Results  Component Value Date   CREATININE 0.76 10/12/2015   CREATININE 0.90 09/27/2015   CREATININE 0.85 07/11/2015    Lab Results  Component Value Date   WBC 2.4*  10/12/2015   HGB 12.3 10/12/2015   HCT 37.0 10/12/2015   PLT 111* 10/12/2015   GLUCOSE 100* 10/12/2015   CHOL 167 01/11/2015   TRIG 72.0 01/11/2015   HDL 54.50 01/11/2015   LDLCALC 98 01/11/2015   ALT 23 07/11/2015   AST 42* 07/11/2015   NA 141 10/12/2015   K 4.0 10/12/2015   CL 107 10/12/2015   CREATININE 0.76 10/12/2015   BUN 18 10/12/2015   CO2 28 10/12/2015   TSH 1.63 07/11/2015   HGBA1C 5.3 06/09/2014   MICROALBUR 0.7 06/09/2014    Mr Abdomen W Wo Contrast  09/27/2015  CLINICAL DATA:  75 year old female with history of frequent urination. Suspicious right renal lesions noted on prior CT examination 06/16/2015. EXAM: MRI ABDOMEN WITHOUT AND WITH CONTRAST TECHNIQUE: Multiplanar multisequence  MR imaging of the abdomen was performed both before and after the administration of intravenous contrast. CONTRAST:  11m MULTIHANCE GADOBENATE DIMEGLUMINE 529 MG/ML IV SOLN COMPARISON:  CT of the abdomen and pelvis 06/16/2015. FINDINGS: Comment: Study is significantly limited by a large amount of patient respiratory motion. Lower chest:  Unremarkable. Hepatobiliary: No cystic or solid hepatic lesions. No intra or extrahepatic biliary ductal dilatation. The gallbladder is normal in appearance. Pancreas: No pancreatic mass. No pancreatic ductal dilatation. No pancreatic or peripancreatic fluid or inflammatory changes. Spleen: Unremarkable. Adrenals/Urinary Tract: In the lower pole of the right kidney there is a 2.6 x 2.4 x 1.8 cm (image 37 of series 10 and coronal image 9 of series 2) lesion that is generally heterogeneously isointense on T1 weighted images, heterogeneously hyperintense on T2 weighted images, but demonstrates extensive internal enhancement on post gadolinium images, compatible with a solid renal neoplasm. This is well separate from the right renal vein which is widely patent at this time. The other lesion of concern in the medial aspect of the interpolar region of the right kidney  measures 2.1 cm in diameter and is low T1 signal intensity, high T2 signal intensity, without definite internal enhancement (assessment is slightly limited by extensive patient motion), compatible with a simple cyst. Other sub cm lesions in the right kidney are low T1 signal intensity, high T2 signal intensity and do not enhance, compatible with tiny simple cysts. Left kidney is normal in appearance. No hydroureteronephrosis in the visualized abdomen. Bilateral adrenal glands are normal in appearance. Stomach/Bowel: Visualized portions are unremarkable. Vascular/Lymphatic: Atherosclerosis in the visualized abdominal vasculature, without evidence of aneurysm. No lymphadenopathy noted in the abdomen. Other: No significant volume of ascites in the visualized peritoneal cavity. Musculoskeletal: No aggressive osseous lesions are noted in the visualized portions of the skeleton. IMPRESSION: 1. The lesion of concern in the lower pole of the right kidney has imaging characteristics highly suspicious for a small renal cell carcinoma. This appears encapsulated by Gerota's fascia and is separate from the right renal vein at this time. No associated lymphadenopathy. Urologic consultation is recommended. 2. Other simple cysts are also noted in the right kidney. Electronically Signed   By: DVinnie LangtonM.D.   On: 09/27/2015 10:45    Assessment & Plan:   Problem List Items Addressed This Visit    Preoperative evaluation of a medical condition to rule out surgical contraindications (TAR required) - Primary    EKG done today is negative for arrhythmias or other conditions which would require management prior to surgery. She is considered low risk for peroperative cardiac complications.  Her last platelet count was > 100K  Lab Results  Component Value Date   WBC 2.4* 10/12/2015   HGB 12.3 10/12/2015   HCT 37.0 10/12/2015   MCV 88.0 10/12/2015   PLT 111* 10/12/2015         Left carotid bruit   Relevant Orders    EKG 12-Lead (Completed)     A total of 40 minutes of face to face time was spent with patient more than half of which was spent in counselling and coordination of care   I am having Ms. Bernasconi maintain her Fish Oil, Calcium-Vitamin D, acetaminophen, vitamin C, ferrous sulfate, Cholecalciferol (D3-1000 PO), hydrALAZINE, hydroxychloroquine, brimonidine, fluticasone, pantoprazole, nitroGLYCERIN, mirtazapine, ALPRAZolam, gabapentin, losartan, ipratropium, mirabegron ER, levothyroxine, and hydrALAZINE.  No orders of the defined types were placed in this encounter.    There are no discontinued medications.  Follow-up: No Follow-up on  file.   [name], MD

## 2015-10-12 ENCOUNTER — Encounter
Admission: RE | Admit: 2015-10-12 | Discharge: 2015-10-12 | Disposition: A | Discharge status: Home / Self Care | Payer: PPO | Source: Ambulatory Visit | Attending: Urology | Admitting: Urology

## 2015-10-12 ENCOUNTER — Telehealth: Payer: Self-pay

## 2015-10-12 DIAGNOSIS — Z7189 Other specified counseling: Secondary | ICD-10-CM | POA: Insufficient documentation

## 2015-10-12 DIAGNOSIS — Z01812 Encounter for preprocedural laboratory examination: Secondary | ICD-10-CM | POA: Insufficient documentation

## 2015-10-12 HISTORY — DX: Gastro-esophageal reflux disease without esophagitis: K21.9

## 2015-10-12 HISTORY — DX: Anxiety disorder, unspecified: F41.9

## 2015-10-12 HISTORY — DX: Allergy status to unspecified drugs, medicaments and biological substances: Z88.9

## 2015-10-12 LAB — CBC
HCT: 37 % (ref 35.0–47.0)
Hemoglobin: 12.3 g/dL (ref 12.0–16.0)
MCH: 29.3 pg (ref 26.0–34.0)
MCHC: 33.3 g/dL (ref 32.0–36.0)
MCV: 88 fL (ref 80.0–100.0)
Platelets: 111 10*3/uL — ABNORMAL LOW (ref 150–440)
RBC: 4.21 MIL/uL (ref 3.80–5.20)
RDW: 16.7 % — AB (ref 11.5–14.5)
WBC: 2.4 10*3/uL — AB (ref 3.6–11.0)

## 2015-10-12 LAB — URINALYSIS COMPLETE WITH MICROSCOPIC (ARMC ONLY)
Bacteria, UA: NONE SEEN
Bilirubin Urine: NEGATIVE
Glucose, UA: NEGATIVE mg/dL
KETONES UR: NEGATIVE mg/dL
LEUKOCYTES UA: NEGATIVE
NITRITE: NEGATIVE
PH: 6 (ref 5.0–8.0)
Protein, ur: NEGATIVE mg/dL
SPECIFIC GRAVITY, URINE: 1.009 (ref 1.005–1.030)

## 2015-10-12 LAB — BASIC METABOLIC PANEL
Anion gap: 6 (ref 5–15)
BUN: 18 mg/dL (ref 6–20)
CALCIUM: 9.5 mg/dL (ref 8.9–10.3)
CO2: 28 mmol/L (ref 22–32)
CREATININE: 0.76 mg/dL (ref 0.44–1.00)
Chloride: 107 mmol/L (ref 101–111)
GFR calc non Af Amer: >60 mL/min (ref >60)
Glucose, Bld: 100 mg/dL — ABNORMAL HIGH (ref 65–99)
Potassium: 4 mmol/L (ref 3.5–5.1)
SODIUM: 141 mmol/L (ref 135–145)

## 2015-10-12 LAB — ABO/RH: ABO/RH(D): O POS

## 2015-10-12 NOTE — Patient Instructions (Signed)
  Your procedure is scheduled [id number] 20, 2017 (Monday) Report to Day Surgery.[facility]) Second Floor To find out your arrival time please call [phone] between 1PM - 3PM on October 20, 2015 (Friday).  Remember: Instructions that are not followed completely may result in serious medical risk, up to and including death, or upon the discretion of your surgeon and anesthesiologist your surgery may need to be rescheduled.    __x__ 1. Do not eat food or drink liquids after midnight. No gum chewing or hard candies.     ____ 2. No Alcohol for 24 hours before or after surgery.   ____ 3. Bring all medications with you on the day of surgery if instructed.    __x__ 4. Notify your doctor if there is any change in your medical condition     (cold, fever, infections).     Do not wear jewelry, make-up, hairpins, clips or nail polish.  Do not wear lotions, powders, or perfumes. You may wear deodorant.  Do not shave 48 hours prior to surgery. Men may shave face and neck.  Do not bring valuables to the hospital.    [facility] is not responsible for any belongings or valuables.               Contacts, dentures or bridgework may not be worn into surgery.  Leave your suitcase in the car. After surgery it may be brought to your room.  For patients admitted to the hospital, discharge time is determined by your                treatment team.   Patients discharged the day of surgery will not be allowed to drive home.   Please read over the following fact sheets that you were given:   Surgical Site Infection Prevention   ____ Take these medicines the morning of surgery with A SIP OF WATER:    1. Gabapentin  2. hydrALAZINE  3. Losartan  4.Protonix (Protonix at bedtime on Sunday night)  5.  6.  ____ Fleet Enema (as directed)   _x___ Use CHG Soap as directed  ____ Use inhalers on the day of surgery  ____ Stop metformin 2 days prior to surgery    ____ Take 1/2 of usual insulin dose the  night before surgery and none on the morning of surgery.   __x__ Stop Coumadin/Plavix/aspirin on (N/A)  __x__ Stop Anti-inflammatories on (NO NSAIDS) TYLENOL OK TO TAKE FOR PAIN IF NEEDED   __x__ Stop supplements until after surgery.  (STOP FISH OIL AND VITAMIN C NOW)  ____ Bring C-Pap to the hospital.

## 2015-10-12 NOTE — Telephone Encounter (Signed)
Caroline Nichols from [place] testing states that anesthesia asked if can address in epic the most recent ECG performed on 10/10/15. Anesthesia wants to know if ECG is in normal range. Please advise, thanks

## 2015-10-12 NOTE — Assessment & Plan Note (Signed)
EKG done today is negative for arrhythmias or other conditions which would require management prior to surgery. She is considered low risk for peroperative cardiac complications.  Her last platelet count was > 100K  Lab Results  Component Value Date   WBC 2.4* 10/12/2015   HGB 12.3 10/12/2015   HCT 37.0 10/12/2015   MCV 88.0 10/12/2015   PLT 111* 10/12/2015

## 2015-10-14 LAB — URINE CULTURE: Culture: NO GROWTH

## 2015-10-20 LAB — PREPARE RBC (CROSSMATCH)

## 2015-10-23 ENCOUNTER — Inpatient Hospital Stay: Payer: PPO | Admitting: Certified Registered"

## 2015-10-23 ENCOUNTER — Inpatient Hospital Stay
Admission: RE | Admit: 2015-10-23 | Discharge: 2015-11-06 | DRG: 656 | Disposition: A | Discharge status: Home / Self Care | Payer: PPO | Source: Ambulatory Visit | Attending: Urology | Admitting: Urology

## 2015-10-23 ENCOUNTER — Encounter
Admission: RE | Disposition: A | Discharge status: Home / Self Care | Payer: Self-pay | Source: Ambulatory Visit | Attending: Urology

## 2015-10-23 ENCOUNTER — Encounter: Payer: Self-pay | Admitting: *Deleted

## 2015-10-23 DIAGNOSIS — D72829 Elevated white blood cell count, unspecified: Secondary | ICD-10-CM | POA: Diagnosis not present

## 2015-10-23 DIAGNOSIS — Z923 Personal history of irradiation: Secondary | ICD-10-CM

## 2015-10-23 DIAGNOSIS — Z0189 Encounter for other specified special examinations: Secondary | ICD-10-CM

## 2015-10-23 DIAGNOSIS — Z886 Allergy status to analgesic agent status: Secondary | ICD-10-CM | POA: Diagnosis not present

## 2015-10-23 DIAGNOSIS — T50995A Adverse effect of other drugs, medicaments and biological substances, initial encounter: Secondary | ICD-10-CM | POA: Diagnosis not present

## 2015-10-23 DIAGNOSIS — I110 Hypertensive heart disease with heart failure: Secondary | ICD-10-CM | POA: Diagnosis present

## 2015-10-23 DIAGNOSIS — I5033 Acute on chronic diastolic (congestive) heart failure: Secondary | ICD-10-CM | POA: Diagnosis not present

## 2015-10-23 DIAGNOSIS — K567 Ileus, unspecified: Secondary | ICD-10-CM | POA: Diagnosis not present

## 2015-10-23 DIAGNOSIS — J9601 Acute respiratory failure with hypoxia: Secondary | ICD-10-CM | POA: Diagnosis not present

## 2015-10-23 DIAGNOSIS — H409 Unspecified glaucoma: Secondary | ICD-10-CM | POA: Diagnosis not present

## 2015-10-23 DIAGNOSIS — G629 Polyneuropathy, unspecified: Secondary | ICD-10-CM | POA: Diagnosis present

## 2015-10-23 DIAGNOSIS — R0602 Shortness of breath: Secondary | ICD-10-CM

## 2015-10-23 DIAGNOSIS — Z8679 Personal history of other diseases of the circulatory system: Secondary | ICD-10-CM

## 2015-10-23 DIAGNOSIS — E039 Hypothyroidism, unspecified: Secondary | ICD-10-CM | POA: Diagnosis not present

## 2015-10-23 DIAGNOSIS — R109 Unspecified abdominal pain: Secondary | ICD-10-CM

## 2015-10-23 DIAGNOSIS — R000 Tachycardia, unspecified: Secondary | ICD-10-CM | POA: Diagnosis not present

## 2015-10-23 DIAGNOSIS — G4700 Insomnia, unspecified: Secondary | ICD-10-CM | POA: Diagnosis not present

## 2015-10-23 DIAGNOSIS — I959 Hypotension, unspecified: Secondary | ICD-10-CM | POA: Diagnosis not present

## 2015-10-23 DIAGNOSIS — R339 Retention of urine, unspecified: Secondary | ICD-10-CM | POA: Diagnosis not present

## 2015-10-23 DIAGNOSIS — E785 Hyperlipidemia, unspecified: Secondary | ICD-10-CM | POA: Diagnosis not present

## 2015-10-23 DIAGNOSIS — I480 Paroxysmal atrial fibrillation: Secondary | ICD-10-CM | POA: Diagnosis not present

## 2015-10-23 DIAGNOSIS — F411 Generalized anxiety disorder: Secondary | ICD-10-CM | POA: Diagnosis not present

## 2015-10-23 DIAGNOSIS — E890 Postprocedural hypothyroidism: Secondary | ICD-10-CM | POA: Diagnosis present

## 2015-10-23 DIAGNOSIS — N3281 Overactive bladder: Secondary | ICD-10-CM | POA: Diagnosis not present

## 2015-10-23 DIAGNOSIS — T380X5A Adverse effect of glucocorticoids and synthetic analogues, initial encounter: Secondary | ICD-10-CM | POA: Diagnosis not present

## 2015-10-23 DIAGNOSIS — J449 Chronic obstructive pulmonary disease, unspecified: Secondary | ICD-10-CM | POA: Diagnosis not present

## 2015-10-23 DIAGNOSIS — Z887 Allergy status to serum and vaccine status: Secondary | ICD-10-CM | POA: Diagnosis not present

## 2015-10-23 DIAGNOSIS — R410 Disorientation, unspecified: Secondary | ICD-10-CM | POA: Diagnosis not present

## 2015-10-23 DIAGNOSIS — F419 Anxiety disorder, unspecified: Secondary | ICD-10-CM | POA: Diagnosis present

## 2015-10-23 DIAGNOSIS — D649 Anemia, unspecified: Secondary | ICD-10-CM | POA: Diagnosis not present

## 2015-10-23 DIAGNOSIS — C641 Malignant neoplasm of right kidney, except renal pelvis: Principal | ICD-10-CM | POA: Diagnosis present

## 2015-10-23 DIAGNOSIS — K219 Gastro-esophageal reflux disease without esophagitis: Secondary | ICD-10-CM | POA: Diagnosis not present

## 2015-10-23 DIAGNOSIS — R0902 Hypoxemia: Secondary | ICD-10-CM

## 2015-10-23 DIAGNOSIS — J9811 Atelectasis: Secondary | ICD-10-CM | POA: Diagnosis not present

## 2015-10-23 DIAGNOSIS — I509 Heart failure, unspecified: Secondary | ICD-10-CM | POA: Diagnosis not present

## 2015-10-23 DIAGNOSIS — Z85528 Personal history of other malignant neoplasm of kidney: Secondary | ICD-10-CM | POA: Diagnosis present

## 2015-10-23 DIAGNOSIS — Z87891 Personal history of nicotine dependence: Secondary | ICD-10-CM

## 2015-10-23 DIAGNOSIS — Y92230 Patient room in hospital as the place of occurrence of the external cause: Secondary | ICD-10-CM | POA: Diagnosis not present

## 2015-10-23 DIAGNOSIS — N2889 Other specified disorders of kidney and ureter: Secondary | ICD-10-CM | POA: Diagnosis present

## 2015-10-23 DIAGNOSIS — F329 Major depressive disorder, single episode, unspecified: Secondary | ICD-10-CM | POA: Diagnosis not present

## 2015-10-23 DIAGNOSIS — R140 Abdominal distension (gaseous): Secondary | ICD-10-CM

## 2015-10-23 DIAGNOSIS — R0689 Other abnormalities of breathing: Secondary | ICD-10-CM

## 2015-10-23 DIAGNOSIS — D62 Acute posthemorrhagic anemia: Secondary | ICD-10-CM | POA: Diagnosis not present

## 2015-10-23 DIAGNOSIS — E559 Vitamin D deficiency, unspecified: Secondary | ICD-10-CM | POA: Diagnosis not present

## 2015-10-23 DIAGNOSIS — J441 Chronic obstructive pulmonary disease with (acute) exacerbation: Secondary | ICD-10-CM | POA: Diagnosis not present

## 2015-10-23 DIAGNOSIS — I4891 Unspecified atrial fibrillation: Secondary | ICD-10-CM | POA: Diagnosis not present

## 2015-10-23 DIAGNOSIS — B029 Zoster without complications: Secondary | ICD-10-CM | POA: Diagnosis not present

## 2015-10-23 DIAGNOSIS — M0570 Rheumatoid arthritis with rheumatoid factor of unspecified site without organ or systems involvement: Secondary | ICD-10-CM | POA: Diagnosis not present

## 2015-10-23 DIAGNOSIS — I10 Essential (primary) hypertension: Secondary | ICD-10-CM | POA: Diagnosis not present

## 2015-10-23 HISTORY — DX: Malignant neoplasm of right kidney, except renal pelvis: C64.1

## 2015-10-23 HISTORY — PX: ROBOTIC ASSITED PARTIAL NEPHRECTOMY: SHX6087

## 2015-10-23 LAB — BASIC METABOLIC PANEL
Anion gap: 4 — ABNORMAL LOW (ref 5–15)
BUN: 26 mg/dL — AB (ref 6–20)
CALCIUM: 7.9 mg/dL — AB (ref 8.9–10.3)
CO2: 24 mmol/L (ref 22–32)
Chloride: 111 mmol/L (ref 101–111)
Creatinine, Ser: 0.87 mg/dL (ref 0.44–1.00)
GLUCOSE: 136 mg/dL — AB (ref 65–99)
Potassium: 3.9 mmol/L (ref 3.5–5.1)
SODIUM: 139 mmol/L (ref 135–145)

## 2015-10-23 LAB — CBC
HCT: 33.7 % — ABNORMAL LOW (ref 35.0–47.0)
Hemoglobin: 11.2 g/dL — ABNORMAL LOW (ref 12.0–16.0)
MCH: 29.5 pg (ref 26.0–34.0)
MCHC: 33.2 g/dL (ref 32.0–36.0)
MCV: 88.9 fL (ref 80.0–100.0)
PLATELETS: 99 10*3/uL — AB (ref 150–440)
RBC: 3.79 MIL/uL — ABNORMAL LOW (ref 3.80–5.20)
RDW: 17 % — AB (ref 11.5–14.5)
WBC: 7 10*3/uL (ref 3.6–11.0)

## 2015-10-23 LAB — APTT: APTT: 32 s (ref 24–36)

## 2015-10-23 LAB — PROTIME-INR
INR: 1.11
Prothrombin Time: 14.5 seconds (ref 11.4–15.0)

## 2015-10-23 LAB — GLUCOSE, CAPILLARY
Glucose-Capillary: 72 mg/dL (ref 65–99)
Glucose-Capillary: 101 mg/dL — ABNORMAL HIGH (ref 65–99)

## 2015-10-23 SURGERY — ROBOTIC ASSITED PARTIAL NEPHRECTOMY
Anesthesia: General | Laterality: Right | Wound class: Clean Contaminated

## 2015-10-23 MED ORDER — PANTOPRAZOLE SODIUM 40 MG PO TBEC
40.0000 mg | DELAYED_RELEASE_TABLET | Freq: Every day | ORAL | Status: DC
  Administered 2015-10-24 – 2015-10-26 (×3): 40 mg via ORAL
  Filled 2015-10-23 (×3): qty 1

## 2015-10-23 MED ORDER — THROMBIN 5000 UNITS EX SOLR
CUTANEOUS | Status: DC | PRN
  Administered 2015-10-23: 5000 [IU] via TOPICAL

## 2015-10-23 MED ORDER — GABAPENTIN 300 MG PO CAPS
300.0000 mg | ORAL_CAPSULE | Freq: Three times a day (TID) | ORAL | Status: DC
Start: 2015-10-23 — End: 2015-11-06
  Administered 2015-10-23 – 2015-11-06 (×36): 300 mg via ORAL
  Filled 2015-10-23 (×37): qty 1

## 2015-10-23 MED ORDER — IPRATROPIUM BROMIDE 0.03 % NA SOLN
1.0000 | Freq: Two times a day (BID) | NASAL | Status: DC
  Administered 2015-10-23 – 2015-11-06 (×23): 1 via NASAL
  Filled 2015-10-23: qty 30

## 2015-10-23 MED ORDER — MORPHINE SULFATE (PF) 2 MG/ML IV SOLN
2.0000 mg | INTRAVENOUS | Status: DC | PRN
  Administered 2015-10-23: 4 mg via INTRAVENOUS
  Administered 2015-10-24 (×4): 2 mg via INTRAVENOUS
  Administered 2015-10-24: 4 mg via INTRAVENOUS
  Administered 2015-10-25: 2 mg via INTRAVENOUS
  Administered 2015-10-25: 4 mg via INTRAVENOUS
  Administered 2015-10-25 – 2015-10-28 (×16): 2 mg via INTRAVENOUS
  Filled 2015-10-23 (×2): qty 1
  Filled 2015-10-23: qty 2
  Filled 2015-10-23 (×9): qty 1
  Filled 2015-10-23: qty 2
  Filled 2015-10-23 (×9): qty 1
  Filled 2015-10-23 (×2): qty 2

## 2015-10-23 MED ORDER — SODIUM CHLORIDE 0.9 % IV SOLN
INTRAVENOUS | Status: DC
  Administered 2015-10-23 – 2015-10-24 (×3): via INTRAVENOUS

## 2015-10-23 MED ORDER — MANNITOL 25 % IV SOLN
INTRAVENOUS | Status: AC
  Filled 2015-10-23: qty 50

## 2015-10-23 MED ORDER — ONDANSETRON HCL 4 MG/2ML IJ SOLN
4.0000 mg | INTRAMUSCULAR | Status: DC | PRN
  Administered 2015-10-26 – 2015-11-03 (×3): 4 mg via INTRAVENOUS
  Filled 2015-10-23 (×3): qty 2

## 2015-10-23 MED ORDER — ROCURONIUM BROMIDE 100 MG/10ML IV SOLN
INTRAVENOUS | Status: DC | PRN
  Administered 2015-10-23 (×2): 10 mg via INTRAVENOUS
  Administered 2015-10-23: 5 mg via INTRAVENOUS
  Administered 2015-10-23: 10 mg via INTRAVENOUS
  Administered 2015-10-23: 40 mg via INTRAVENOUS

## 2015-10-23 MED ORDER — BUPIVACAINE HCL (PF) 0.5 % IJ SOLN
INTRAMUSCULAR | Status: AC
  Filled 2015-10-23: qty 30

## 2015-10-23 MED ORDER — DEXAMETHASONE SODIUM PHOSPHATE 10 MG/ML IJ SOLN
INTRAMUSCULAR | Status: DC | PRN
Start: 2015-10-23 — End: 2015-10-23
  Administered 2015-10-23: 4 mg via INTRAVENOUS

## 2015-10-23 MED ORDER — FLUTICASONE PROPIONATE 50 MCG/ACT NA SUSP
2.0000 | Freq: Every day | NASAL | Status: DC
  Administered 2015-10-24 – 2015-11-06 (×12): 2 via NASAL
  Filled 2015-10-23: qty 16

## 2015-10-23 MED ORDER — BELLADONNA ALKALOIDS-OPIUM 16.2-60 MG RE SUPP
1.0000 | Freq: Four times a day (QID) | RECTAL | Status: DC | PRN
  Administered 2015-10-26: 1 via RECTAL
  Filled 2015-10-23: qty 1

## 2015-10-23 MED ORDER — MEPERIDINE HCL 25 MG/ML IJ SOLN: 6.2500 mg | INTRAMUSCULAR | Status: DC | PRN

## 2015-10-23 MED ORDER — DIPHENHYDRAMINE HCL 50 MG/ML IJ SOLN
12.5000 mg | Freq: Four times a day (QID) | INTRAMUSCULAR | Status: DC | PRN
  Administered 2015-10-27: 12.5 mg via INTRAVENOUS
  Filled 2015-10-23: qty 1

## 2015-10-23 MED ORDER — ACETAMINOPHEN 325 MG PO TABS: 650.0000 mg | ORAL_TABLET | ORAL | Status: DC | PRN

## 2015-10-23 MED ORDER — ALPRAZOLAM 0.25 MG PO TABS
0.5000 mg | ORAL_TABLET | Freq: Two times a day (BID) | ORAL | Status: DC | PRN
  Administered 2015-10-28 – 2015-10-29 (×2): 0.5 mg via ORAL
  Filled 2015-10-23 (×2): qty 2

## 2015-10-23 MED ORDER — DIPHENHYDRAMINE HCL 12.5 MG/5ML PO ELIX
12.5000 mg | ORAL_SOLUTION | Freq: Four times a day (QID) | ORAL | Status: DC | PRN
  Filled 2015-10-23: qty 5

## 2015-10-23 MED ORDER — GLYCOPYRROLATE 0.2 MG/ML IJ SOLN
INTRAMUSCULAR | Status: DC | PRN
  Administered 2015-10-23: 0.6 mg via INTRAVENOUS

## 2015-10-23 MED ORDER — OXYCODONE-ACETAMINOPHEN 5-325 MG PO TABS
1.0000 | ORAL_TABLET | ORAL | Status: DC | PRN
  Administered 2015-10-23 – 2015-10-25 (×4): 1 via ORAL
  Administered 2015-10-26 (×2): 2 via ORAL
  Administered 2015-10-29: 1 via ORAL
  Filled 2015-10-23: qty 1
  Filled 2015-10-23 (×2): qty 2
  Filled 2015-10-23 (×4): qty 1
  Filled 2015-10-23: qty 2

## 2015-10-23 MED ORDER — LIDOCAINE HCL (CARDIAC) 20 MG/ML IV SOLN
INTRAVENOUS | Status: DC | PRN
  Administered 2015-10-23: 60 mg via INTRAVENOUS

## 2015-10-23 MED ORDER — ACETAMINOPHEN 10 MG/ML IV SOLN
INTRAVENOUS | Status: AC
  Filled 2015-10-23: qty 100

## 2015-10-23 MED ORDER — MIRTAZAPINE 15 MG PO TABS
7.5000 mg | ORAL_TABLET | Freq: Every day | ORAL | Status: DC
  Administered 2015-10-23 – 2015-11-05 (×12): 7.5 mg via ORAL
  Filled 2015-10-23 (×4): qty 1
  Filled 2015-10-23: qty 2
  Filled 2015-10-23 (×9): qty 1

## 2015-10-23 MED ORDER — MIDAZOLAM HCL 2 MG/2ML IJ SOLN
INTRAMUSCULAR | Status: DC | PRN
  Administered 2015-10-23: 1 mg via INTRAVENOUS

## 2015-10-23 MED ORDER — ONDANSETRON HCL 4 MG/2ML IJ SOLN
INTRAMUSCULAR | Status: DC | PRN
  Administered 2015-10-23: 4 mg via INTRAVENOUS

## 2015-10-23 MED ORDER — BUPIVACAINE HCL 0.5 % IJ SOLN
INTRAMUSCULAR | Status: DC | PRN
  Administered 2015-10-23 (×2): 10 mL

## 2015-10-23 MED ORDER — FENTANYL CITRATE (PF) 100 MCG/2ML IJ SOLN
INTRAMUSCULAR | Status: DC | PRN
  Administered 2015-10-23 (×5): 50 ug via INTRAVENOUS
  Administered 2015-10-23: 12.5 ug via INTRAVENOUS

## 2015-10-23 MED ORDER — ACETAMINOPHEN 10 MG/ML IV SOLN
INTRAVENOUS | Status: DC | PRN
  Administered 2015-10-23: 1000 mg via INTRAVENOUS

## 2015-10-23 MED ORDER — SODIUM CHLORIDE 0.9 % IV SOLN
INTRAVENOUS | Status: DC
Start: 2015-10-23 — End: 2015-10-23
  Administered 2015-10-23: 11:00:00 via INTRAVENOUS

## 2015-10-23 MED ORDER — FUROSEMIDE 10 MG/ML IJ SOLN
INTRAMUSCULAR | Status: DC | PRN
Start: 2015-10-23 — End: 2015-10-23
  Administered 2015-10-23: 10 mg via INTRAMUSCULAR

## 2015-10-23 MED ORDER — THROMBIN 5000 UNITS EX SOLR
CUTANEOUS | Status: AC
  Filled 2015-10-23: qty 5000

## 2015-10-23 MED ORDER — OXYBUTYNIN CHLORIDE 5 MG PO TABS: 5.0000 mg | ORAL_TABLET | Freq: Three times a day (TID) | ORAL | Status: DC | PRN

## 2015-10-23 MED ORDER — DOCUSATE SODIUM 100 MG PO CAPS
100.0000 mg | ORAL_CAPSULE | Freq: Two times a day (BID) | ORAL | Status: DC
  Administered 2015-10-23 – 2015-10-31 (×13): 100 mg via ORAL
  Filled 2015-10-23 (×14): qty 1

## 2015-10-23 MED ORDER — HYDRALAZINE HCL 10 MG PO TABS
10.0000 mg | ORAL_TABLET | Freq: Two times a day (BID) | ORAL | Status: DC
  Administered 2015-10-23 – 2015-11-06 (×23): 10 mg via ORAL
  Filled 2015-10-23 (×23): qty 1

## 2015-10-23 MED ORDER — FENTANYL CITRATE (PF) 100 MCG/2ML IJ SOLN
INTRAMUSCULAR | Status: AC
  Administered 2015-10-23: 25 ug via INTRAVENOUS
  Filled 2015-10-23: qty 2

## 2015-10-23 MED ORDER — CEFAZOLIN SODIUM-DEXTROSE 2-3 GM-% IV SOLR
INTRAVENOUS | Status: AC
  Administered 2015-10-23: 2 g via INTRAVENOUS
  Filled 2015-10-23: qty 50

## 2015-10-23 MED ORDER — CEFAZOLIN SODIUM-DEXTROSE 2-3 GM-% IV SOLR: 2.0000 g | Freq: Once | INTRAVENOUS | Status: DC

## 2015-10-23 MED ORDER — CEFAZOLIN SODIUM 1-5 GM-% IV SOLN
1.0000 g | Freq: Three times a day (TID) | INTRAVENOUS | Status: AC
Start: 2015-10-23 — End: 2015-10-24
  Administered 2015-10-23 – 2015-10-24 (×2): 1 g via INTRAVENOUS
  Filled 2015-10-23 (×2): qty 50

## 2015-10-23 MED ORDER — PROMETHAZINE HCL 25 MG/ML IJ SOLN: 6.2500 mg | INTRAMUSCULAR | Status: DC | PRN

## 2015-10-23 MED ORDER — LEVOTHYROXINE SODIUM 112 MCG PO TABS
112.0000 ug | ORAL_TABLET | Freq: Every day | ORAL | Status: DC
  Administered 2015-10-24 – 2015-10-26 (×3): 112 ug via ORAL
  Filled 2015-10-23 (×3): qty 1

## 2015-10-23 MED ORDER — LOSARTAN POTASSIUM 50 MG PO TABS
50.0000 mg | ORAL_TABLET | Freq: Every day | ORAL | Status: DC
  Administered 2015-10-24 – 2015-11-06 (×12): 50 mg via ORAL
  Filled 2015-10-23 (×12): qty 1

## 2015-10-23 MED ORDER — SODIUM CHLORIDE 0.9 % IV SOLN
INTRAVENOUS | Status: DC | PRN
  Administered 2015-10-23: 11:00:00 via INTRAVENOUS

## 2015-10-23 MED ORDER — NEOSTIGMINE METHYLSULFATE 10 MG/10ML IV SOLN
INTRAVENOUS | Status: DC | PRN
  Administered 2015-10-23: 4 mg via INTRAVENOUS

## 2015-10-23 MED ORDER — PROPOFOL 10 MG/ML IV BOLUS
INTRAVENOUS | Status: DC | PRN
  Administered 2015-10-23: 100 mg via INTRAVENOUS
  Administered 2015-10-23: 50 mg via INTRAVENOUS

## 2015-10-23 MED ORDER — FENTANYL CITRATE (PF) 100 MCG/2ML IJ SOLN
25.0000 ug | INTRAMUSCULAR | Status: DC | PRN
  Administered 2015-10-23 (×2): 25 ug via INTRAVENOUS

## 2015-10-23 MED ORDER — MANNITOL 25 % IV SOLN
INTRAVENOUS | Status: DC | PRN
  Administered 2015-10-23 (×2): 12.5 g via INTRAVENOUS

## 2015-10-23 MED ORDER — SODIUM CHLORIDE 0.9 % IV SOLN
INTRAVENOUS | Status: DC | PRN
  Administered 2015-10-23 (×2): via INTRAVENOUS

## 2015-10-23 MED ORDER — HYDROXYCHLOROQUINE SULFATE 200 MG PO TABS
400.0000 mg | ORAL_TABLET | Freq: Every day | ORAL | Status: DC
  Administered 2015-10-24 – 2015-11-06 (×13): 400 mg via ORAL
  Filled 2015-10-23 (×13): qty 2

## 2015-10-23 SURGICAL SUPPLY — 89 items
ANCHOR TIS RET SYS 235ML (MISCELLANEOUS) IMPLANT
APPLICATOR SURGIFLO (MISCELLANEOUS) ×2 IMPLANT
BULB RESERV EVAC DRAIN JP 100C (MISCELLANEOUS) ×2 IMPLANT
CANISTER SUCT 1200ML W/VALVE (MISCELLANEOUS) ×2 IMPLANT
CHLORAPREP W/TINT 26ML (MISCELLANEOUS) ×4 IMPLANT
CLIP LIGATING HEM O LOK PURPLE (MISCELLANEOUS) ×6 IMPLANT
CLIP LIGATING HEMO O LOK GREEN (MISCELLANEOUS) IMPLANT
CLIP SUT LAPRA TY ABSORB (SUTURE) ×4 IMPLANT
CORD BIP STRL DISP 12FT (MISCELLANEOUS) ×2 IMPLANT
COVER TIP SHEARS 8 DVNC (MISCELLANEOUS) ×1 IMPLANT
COVER TIP SHEARS 8MM DA VINCI (MISCELLANEOUS) ×1
DEFOGGER SCOPE WARMER CLEARIFY (MISCELLANEOUS) ×2 IMPLANT
DRAIN CHANNEL JP 19F (MISCELLANEOUS) ×2 IMPLANT
DRAPE SHEET LG 3/4 BI-LAMINATE (DRAPES) ×2 IMPLANT
DRAPE SURG 17X11 SM STRL (DRAPES) ×8 IMPLANT
DRIVER LRG NEEDLE DA VINCI (INSTRUMENTS) ×1
DRIVER NDLE LRG DVNC (INSTRUMENTS) ×1 IMPLANT
ELECT PAD DSPR THERM+ ADLT (MISCELLANEOUS) IMPLANT
ELECT REM PT RETURN 9FT ADLT (ELECTROSURGICAL) ×2
ELECTRODE REM PT RTRN 9FT ADLT (ELECTROSURGICAL) ×1 IMPLANT
FILTER LAP SMOKE EVAC STRL (MISCELLANEOUS) ×2 IMPLANT
GLOVE BIO SURGEON STRL SZ 6.5 (GLOVE) ×6 IMPLANT
GLOVE BIOGEL PI IND STRL 6.5 (GLOVE) ×2 IMPLANT
GLOVE BIOGEL PI INDICATOR 6.5 (GLOVE) ×2
GOWN STRL REUS W/ TWL LRG LVL3 (GOWN DISPOSABLE) ×6 IMPLANT
GOWN STRL REUS W/TWL LRG LVL3 (GOWN DISPOSABLE) ×6
GRASPER DBL FENESTRATED (INSTRUMENTS) ×1
GRASPER DBL FENESTRATED DVNC (INSTRUMENTS) ×1 IMPLANT
GRASPER SUT TROCAR 14GX15 (MISCELLANEOUS) ×2 IMPLANT
HEMOSTAT SURGICEL 2X14 (HEMOSTASIS) ×2 IMPLANT
IRRIGATION STRYKERFLOW (MISCELLANEOUS) ×1 IMPLANT
IRRIGATOR STRYKERFLOW (MISCELLANEOUS) ×2
IV LACTATED RINGERS 1000ML (IV SOLUTION) ×2 IMPLANT
IV NS 1000ML (IV SOLUTION) ×1
IV NS 1000ML BAXH (IV SOLUTION) ×1 IMPLANT
KIT ACCESSORY DA VINCI DISP (KITS) ×1
KIT ACCESSORY DVNC DISP (KITS) ×1 IMPLANT
KIT RM TURNOVER STRD PROC AR (KITS) ×2 IMPLANT
LABEL OR SOLS (LABEL) ×2 IMPLANT
LIQUID BAND (GAUZE/BANDAGES/DRESSINGS) ×2 IMPLANT
LOOP RED MAXI  1X406MM (MISCELLANEOUS) ×1
LOOP VESSEL MAXI 1X406 RED (MISCELLANEOUS) ×1 IMPLANT
NDL SAFETY 18GX1.5 (NEEDLE) ×2 IMPLANT
NEEDLE HYPO 25X1 1.5 SAFETY (NEEDLE) ×2 IMPLANT
NEEDLE INSUFFLATION 14GA 120MM (NEEDLE) ×4 IMPLANT
NS IRRIG 500ML POUR BTL (IV SOLUTION) ×2 IMPLANT
PACK LAP CHOLECYSTECTOMY (MISCELLANEOUS) ×2 IMPLANT
PAD TRENDELENBURG OR TABLE (MISCELLANEOUS) ×2 IMPLANT
PENCIL ELECTRO HAND CTR (MISCELLANEOUS) ×2 IMPLANT
PROBE ULTRASOUND PROART (MISCELLANEOUS) IMPLANT
PROGRASP ENDOWRIST DA VINCI (INSTRUMENTS) ×1
PROGRASP ENDOWRIST DVNC (INSTRUMENTS) ×1 IMPLANT
SCISSORS METZENBAUM CVD 33 (INSTRUMENTS) IMPLANT
SLEEVE ENDOPATH XCEL 5M (ENDOMECHANICALS) ×4 IMPLANT
SOLUTION ELECTROLUBE (MISCELLANEOUS) ×2 IMPLANT
SPOGE SURGIFLO 8M (HEMOSTASIS) ×1
SPONGE EXCIL AMD DRAIN 4X4 6P (MISCELLANEOUS) ×2 IMPLANT
SPONGE SURGIFLO 8M (HEMOSTASIS) ×1 IMPLANT
STAPLE RELOAD 2.5MM WHITE (STAPLE) IMPLANT
STAPLER SKIN PROX 35W (STAPLE) ×2 IMPLANT
STAPLER VASCULAR ECHELON 35 (CUTTER) IMPLANT
STRAP SAFETY BODY (MISCELLANEOUS) ×6 IMPLANT
SUT DVC V-LOC 4-0 90 CLR P-12 (SUTURE) ×2
SUT DVC VLOC 90 3-0 CV23 UNDY (SUTURE) ×4 IMPLANT
SUT ETHILON 3-0 FS-10 30 BLK (SUTURE) ×2
SUT MNCRL AB 4-0 PS2 18 (SUTURE) ×4 IMPLANT
SUT PDS PLUS 0 (SUTURE)
SUT PDS PLUS AB 0 CT-2 (SUTURE) IMPLANT
SUT PROLENE 4 0 RB 1 (SUTURE) ×1
SUT PROLENE 4-0 RB1 .5 CRCL 36 (SUTURE) ×1 IMPLANT
SUT VIC AB 0 CT1 36 (SUTURE) IMPLANT
SUT VIC AB 0 CT2 27 (SUTURE) ×2 IMPLANT
SUT VIC AB 2-0 SH 27 (SUTURE) ×1
SUT VIC AB 2-0 SH 27XBRD (SUTURE) ×1 IMPLANT
SUT VIC AB 2-0 UR6 27 (SUTURE) ×2 IMPLANT
SUT VICRYL 0 AB UR-6 (SUTURE) IMPLANT
SUT VICRYL PLUS ABS 0 54 (SUTURE) ×2 IMPLANT
SUTURE DVC V-LC4-0 90 CLR P-12 (SUTURE) ×1 IMPLANT
SUTURE EHLN 3-0 FS-10 30 BLK (SUTURE) ×1 IMPLANT
SYR 3ML LL SCALE MARK (SYRINGE) ×2 IMPLANT
TAPE CLOTH 10X20 WHT NS LF (TAPE) ×2 IMPLANT
TAPE CLOTH 2X10 WHT NS LF (TAPE) ×2
TROCAR 12M 150ML BLUNT (TROCAR) ×2 IMPLANT
TROCAR DISP BLADELESS 8 DVNC (TROCAR) ×1 IMPLANT
TROCAR DISP BLADELESS 8MM (TROCAR) ×1
TROCAR ENDOPATH XCEL 12X100 BL (ENDOMECHANICALS) ×2 IMPLANT
TROCAR XCEL 12X100 BLDLESS (ENDOMECHANICALS) ×2 IMPLANT
TROCAR XCEL NON-BLD 5MMX100MML (ENDOMECHANICALS) ×2 IMPLANT
TUBING INSUFFLATOR HI FLOW (MISCELLANEOUS) ×2 IMPLANT

## 2015-10-23 NOTE — Interval H&P Note (Signed)
History and Physical Interval Note:  10/23/2015 11:07 AM  [name]  has presented today for surgery, with the diagnosis of right renal mass  The various methods of treatment have been discussed with the patient and family. After consideration of risks, benefits and other options for treatment, the patient has consented to  Procedure(s): ROBOTIC ASSITED PARTIAL NEPHRECTOMY (Right) as a surgical intervention .  The patient's history has been reviewed, patient examined, no change in status, stable for surgery.  I have reviewed the patient's chart and labs.  Questions were answered to the patient's satisfaction.     Caroline Nichols

## 2015-10-23 NOTE — Discharge Instructions (Signed)
·   Activity:  You are encouraged to ambulate frequently (about every hour during waking hours) to help prevent blood clots from forming in your legs or lungs.  However, you should not engage in any heavy lifting (> 5-10 lbs), strenuous activity, or straining. ° °· Diet: You should advance your diet as instructed by your physician.  It will be normal to have some bloating, nausea, and abdominal discomfort intermittently. ° °· Prescriptions:  You will be provided a prescription for pain medication to take as needed.  If your pain is not severe enough to require the prescription pain medication, you may take extra strength Tylenol instead which will have less side effects.  You should also take a prescribed stool softener to avoid straining with bowel movements as the prescription pain medication may constipate you. ° °· Incisions: You may remove your dressing bandages 48 hours after surgery if not removed in the hospital.  You will either have some small staples or special tissue glue at each of the incision sites. Once the bandages are removed (if present), the incisions may stay open to air.  You may start showering (but not soaking or bathing in water) the 2nd day after surgery and the incisions simply need to be patted dry after the shower.  No additional care is needed. ° °What to call us about: You should call the office if you develop fever > 101 or develop persistent vomiting, redness or draining around your incision, or any other concerning symptoms.   ° °[facility] °[address], [address] °[place], [place] [address] °[phone] ° ° °

## 2015-10-23 NOTE — Anesthesia Procedure Notes (Signed)
Procedure Name: Intubation Performed by: Lounell Schumacher Pre-anesthesia Checklist: Patient identified, Emergency Drugs available, Suction available, Patient being monitored and Timeout performed Patient Re-evaluated:Patient Re-evaluated prior to inductionOxygen Delivery Method: Circle system utilized Preoxygenation: Pre-oxygenation with 100% oxygen Intubation Type: IV induction Ventilation: Mask ventilation without difficulty Laryngoscope Size: Mac and 3 Grade View: Grade I Tube type: Oral Tube size: 7.0 mm Number of attempts: 1 Airway Equipment and Method: Stylet Placement Confirmation: ETT inserted through vocal cords under direct vision,  positive ETCO2 and breath sounds checked- equal and bilateral Secured at: 21 cm Tube secured with: Tape Dental Injury: Teeth and Oropharynx as per pre-operative assessment      

## 2015-10-23 NOTE — H&P (View-Only) (Signed)
09/29/2015 11:09 AM   Caroline Nichols [birth year] [id number]  Referring provider: [name], MD [place], [place] [address]  Chief Complaint  Patient presents with  . Renal Mass    MRI Results    HPI: 75 year old female who presents today to discuss right lower pole enhancing renal mass.  Renal mass Incidentally identified 2.1 cm right lower pole enhancing renal mass. She returns today after undergoing further characterization of the mass with an MRI of the abdomen on 09/27/2015.  The study, the mass measures 2.6 cm with extensive internal enhancement highly suspicious for renal cell carcinoma.  No flank pain. No family history of RCC.  OAB/ nocturia Currently on Mybetriq. Was undergoing treatments with PTNS but insurance change and no unable to afford these treatments. Continues to have significant urinary daytime frequency and nocturia.  History of microscopic hematuria S/p CT urogram/ cystoscopy 06/2015.  Cysto negative. No gross hematuria.    PMH significant for S/p open hysterectomy, tubal and LLQ hernia.  She also has history of anemia.  Lives in assisted nursing facility but good functional status.  PMH: Past Medical History  Diagnosis Date  . Hypertension   . Hyperthyroidism     s/p radiation therapy, now hypothyroidism  . IBS (irritable bowel syndrome)   . Herniated disc   . Pancreatic cyst   . Arthritis     Possible RA - Follow up appt with Dr. Jefm Bryant  . H/O transfusion of packed red blood cells 02/2013    3 units PRBC for severe anemia 02/2013  . Anemia 02/2013    F/U with Dr. Grayland Ormond for Feraheme injections  . Hyperlipidemia   . Allergy   . History of kidney stones   . Migraine   . Cellulitis of arm, right   . Paroxysmal a-fib ([place])   . Diabetes insipidus ([place])     On desmopressin    Surgical History: Past Surgical History  Procedure Laterality Date  . Tubal ligation    . Cervical spine surgery      Bone fusion  .  Hammer toe surgery    . Hernia repair    . Abdominal hysterectomy    . Breast surgery  1990s    Biopsy  . Breast excisional biopsy Right 2005    neg    Home Medications:    Medication List       This list is accurate as of: 09/29/15 11:09 AM.  Always use your most recent med list.               acetaminophen 325 MG tablet  Commonly known as:  TYLENOL  Take 2 tablets (650 mg total) by mouth every 6 (six) hours as needed for pain.     ALPRAZolam 0.5 MG tablet  Commonly known as:  XANAX  Take 1 tablet (0.5 mg total) by mouth 2 (two) times daily as needed for anxiety.     brimonidine 0.2 % ophthalmic solution  Commonly known as:  ALPHAGAN  Place 1 drop into both eyes 2 (two) times daily.     D3-1000 PO  Take by mouth daily. Reported on 07/28/2015     ferrous sulfate 325 (65 FE) MG tablet  Take 325 mg by mouth daily with breakfast.     Fish Oil 1000 MG Caps  Take by mouth daily.     fluticasone 50 MCG/ACT nasal spray  Commonly known as:  FLONASE  Place 2 sprays into both nostrils daily.  gabapentin 300 MG capsule  Commonly known as:  NEURONTIN  TAKE 1 CAPSULE (300 MG TOTAL) BY MOUTH 3 (THREE) TIMES DAILY.     hydrALAZINE 10 MG tablet  Commonly known as:  APRESOLINE  Take 1 tablet (10 mg total) by mouth 2 (two) times daily.     hydrALAZINE 10 MG tablet  Commonly known as:  APRESOLINE  TAKE 1 TABLET (10 MG TOTAL) BY MOUTH 2 (TWO) TIMES DAILY.     hydroxychloroquine 200 MG tablet  Commonly known as:  PLAQUENIL  Take 2 tablets (400 mg total) by mouth daily.     ipratropium 0.03 % nasal spray  Commonly known as:  ATROVENT  Reported on 07/28/2015     levothyroxine 112 MCG tablet  Commonly known as:  SYNTHROID, LEVOTHROID  TAKE 1 TABLET (112 MCG TOTAL) BY MOUTH DAILY BEFORE BREAKFAST.     losartan 50 MG tablet  Commonly known as:  COZAAR  Take 1 tablet (50 mg total) by mouth daily.     mirabegron ER 50 MG Tb24 tablet  Commonly known as:  MYRBETRIQ    Take 1 tablet (50 mg total) by mouth daily.     mirtazapine 7.5 MG tablet  Commonly known as:  REMERON  TAKE 1 TABLET (7.5 MG TOTAL) BY MOUTH AT BEDTIME.     nitroGLYCERIN 0.4 MG/SPRAY spray  Commonly known as:  NITROLINGUAL  Place 1 spray under the tongue every 5 (five) minutes x 3 doses as needed for chest pain.     pantoprazole 40 MG tablet  Commonly known as:  PROTONIX  TAKE 1 TABLET (40 MG TOTAL) BY MOUTH DAILY.     SUPER CALCIUM/D 600-125 MG-UNIT Tabs  Generic drug:  Calcium-Vitamin D  Take by mouth. Taking 2 daily     vitamin C 500 MG tablet  Commonly known as:  ASCORBIC ACID  Take 500 mg by mouth daily.        Allergies:  Allergies  Allergen Reactions  . Codeine     REACTION: NAUSEA  . Tetanus Toxoid     Other reaction(s): Unknown REACTION: ARM SWELLING,REDNESS  . Tetanus Toxoids   . Tramadol Nausea And Vomiting    Family History: Family History  Problem Relation Age of Onset  . Heart disease Mother   . Heart disease Father   . Diabetes Brother   . Kidney disease Brother   . Stroke Daughter     due to medication reaction  . Prostate cancer Neg Hx   . Hematuria Neg Hx     Social History:  reports that she quit smoking about 2 years ago. Her smoking use included Cigarettes. She has a 15 pack-year smoking history. She has never used smokeless tobacco. She reports that she does not drink alcohol or use illicit drugs.  ROS: UROLOGY Frequent Urination?: Yes Hard to postpone urination?: No Burning/pain with urination?: No Get up at night to urinate?: Yes Leakage of urine?: No Urine stream starts and stops?: Yes Trouble starting stream?: Yes Do you have to strain to urinate?: No Blood in urine?: No Urinary tract infection?: No Sexually transmitted disease?: No Injury to kidneys or bladder?: No Painful intercourse?: No Weak stream?: Yes Currently pregnant?: No Vaginal bleeding?: No Last menstrual period?: n  Gastrointestinal Nausea?:  No Vomiting?: No Indigestion/heartburn?: Yes Diarrhea?: Yes Constipation?: No  Constitutional Fever: No Night sweats?: Yes Weight loss?: No Fatigue?: Yes  Skin Skin rash/lesions?: Yes Itching?: Yes  Eyes Blurred vision?: Yes Double vision?: No  Ears/Nose/Throat Sore  throat?: No Sinus problems?: Yes  Hematologic/Lymphatic Swollen glands?: No Easy bruising?: Yes  Cardiovascular Leg swelling?: No Chest pain?: No  Respiratory Cough?: No Shortness of breath?: Yes  Endocrine Excessive thirst?: Yes  Musculoskeletal Back pain?: Yes Joint pain?: Yes  Neurological Headaches?: Yes Dizziness?: No  Psychologic Depression?: No Anxiety?: Yes  Physical Exam: BP 168/89 mmHg  Pulse 80  Ht '5\' 2"'$  (1.575 m)  Wt 136 lb 12.8 oz (62.052 kg)  BMI 25.01 kg/m2  Constitutional:  Alert and oriented, No acute distress. HEENT: [place] AT, moist mucus membranes.  Trachea midline, no masses. Cardiovascular: No clubbing, cyanosis, or edema. CTAB. Respiratory: Normal respiratory effort, no increased work of breathing.  RRR. GI: Abdomen is soft, nontender, nondistended, no abdominal masses.  Abdominal incisions including transverse lower abdominal incision, lower quadrant incision noted.  Hernias. GU: No CVA tenderness.  Skin: No rashes, bruises or suspicious lesions. Lymph: No inguinal adenopathy. Neurologic: Grossly intact, no focal deficits, moving all 4 extremities. Psychiatric: Normal mood and affect.  Laboratory Data: Lab Results  Component Value Date   WBC 3.6 09/15/2015   HGB 12.4 09/15/2015   HCT 36.3 09/15/2015   MCV 87.1 09/15/2015   PLT 126* 09/15/2015    Lab Results  Component Value Date   CREATININE 0.90 09/27/2015    Lab Results  Component Value Date   HGBA1C 5.3 06/09/2014    Pertinent Imaging: CT/ MRI scan reviewed personally today by myself as well as with the patient.  Study Result     CLINICAL DATA: 75 year old female with history of  frequent urination. Suspicious right renal lesions noted on prior CT examination 06/16/2015.  EXAM: MRI ABDOMEN WITHOUT AND WITH CONTRAST  TECHNIQUE: Multiplanar multisequence MR imaging of the abdomen was performed both before and after the administration of intravenous contrast.  CONTRAST: 3m MULTIHANCE GADOBENATE DIMEGLUMINE 529 MG/ML IV SOLN  COMPARISON: CT of the abdomen and pelvis 06/16/2015.  FINDINGS: Comment: Study is significantly limited by a large amount of patient respiratory motion.  Lower chest: Unremarkable.  Hepatobiliary: No cystic or solid hepatic lesions. No intra or extrahepatic biliary ductal dilatation. The gallbladder is normal in appearance.  Pancreas: No pancreatic mass. No pancreatic ductal dilatation. No pancreatic or peripancreatic fluid or inflammatory changes.  Spleen: Unremarkable.  Adrenals/Urinary Tract: In the lower pole of the right kidney there is a 2.6 x 2.4 x 1.8 cm (image 37 of series 10 and coronal image 9 of series 2) lesion that is generally heterogeneously isointense on T1 weighted images, heterogeneously hyperintense on T2 weighted images, but demonstrates extensive internal enhancement on post gadolinium images, compatible with a solid renal neoplasm. This is well separate from the right renal vein which is widely patent at this time. The other lesion of concern in the medial aspect of the interpolar region of the right kidney measures 2.1 cm in diameter and is low T1 signal intensity, high T2 signal intensity, without definite internal enhancement (assessment is slightly limited by extensive patient motion), compatible with a simple cyst. Other sub cm lesions in the right kidney are low T1 signal intensity, high T2 signal intensity and do not enhance, compatible with tiny simple cysts. Left kidney is normal in appearance. No hydroureteronephrosis in the visualized abdomen. Bilateral adrenal glands are normal  in appearance.  Stomach/Bowel: Visualized portions are unremarkable.  Vascular/Lymphatic: Atherosclerosis in the visualized abdominal vasculature, without evidence of aneurysm. No lymphadenopathy noted in the abdomen.  Other: No significant volume of ascites in the visualized peritoneal cavity.  Musculoskeletal:  No aggressive osseous lesions are noted in the visualized portions of the skeleton.  IMPRESSION: 1. The lesion of concern in the lower pole of the right kidney has imaging characteristics highly suspicious for a small renal cell carcinoma. This appears encapsulated by Gerota's fascia and is separate from the right renal vein at this time. No associated lymphadenopathy. Urologic consultation is recommended. 2. Other simple cysts are also noted in the right kidney.   Electronically Signed  By: Vinnie Langton M.D.  On: 09/27/2015 10:45     Study Result     CLINICAL DATA: Frequent urination and pelvic pain for 1 year. Microscopic hematuria. Intermittent dysuria. Hysterectomy. Left inguinal hernia repair.  EXAM: CT ABDOMEN AND PELVIS WITHOUT AND WITH CONTRAST  TECHNIQUE: Multidetector CT imaging of the abdomen and pelvis was performed following the standard protocol before and following the bolus administration of intravenous contrast.  CONTRAST: 175m OMNIPAQUE IOHEXOL 350 MG/ML SOLN  COMPARISON: None.  FINDINGS: Lower chest: Bibasilar scarring. Cardiomegaly, accentuated by a pectus excavatum deformity.  Hepatobiliary: Mild motion degradation on the postcontrast images. Normal liver. Normal gallbladder, without biliary ductal dilatation.  Pancreas: Normal, without mass or ductal dilatation.  Spleen: Normal in size, without focal abnormality.  Adrenals/Urinary Tract: Normal adrenal glands. Favor renal vascular calcification of the medial interpolar right kidney at 4 mm (image 29, series 2). Otherwise, no renal calculi. No  hydronephrosis. Vascular calcifications. No hydroureter or convincing evidence of ureteric stone.  Too small to characterize lesions in both kidneys. An interpolar right renal lesion measures 1.9 cm. Equivocal post-contrast enhancement, 5 HU prior to contrast and 26 HU after contrast (image 18, series 4). A lower pole right renal lesion measures 2.1 cm (image 28, series 4). This is complex, with apparent enhancing areas including on delayed image 33. Best evaluated on delayed coronal imaging (image 45, series 14).  No suspicious left-sided lesion identified. Moderate to good renal collecting system opacification on delayed images. Good ureteric opacification, other than the distal left most ureter. No filling defect. No enhancing bladder mass or filling defect on delayed images.  Stomach/Bowel: Normal stomach, without wall thickening. Scattered colonic diverticula. Normal small bowel.  Vascular/Lymphatic: Advanced aortic and branch vessel atherosclerosis. Patent right renal vein. No retroperitoneal or retrocrural adenopathy. No pelvic adenopathy.  Reproductive: Hysterectomy. No adnexal mass.  Other: No pleural fluid. Left inguinal hernia repair.  Musculoskeletal: Advanced degenerative disc disease, including at L3-4 through L5-S1.  IMPRESSION: 1. Motion degraded images, especially the portal venous phase post-contrast series. 2. Right lower pole renal lesion is suspicious for a solid neoplasm, likely renal cell carcinoma. 3. An interpolar right renal lesion is favored to represent a cyst or minimally complex cyst, but suboptimally evaluated secondary to motion. 4. No evidence of metastatic disease. 5. Given motion on the current exam, consider confirmation with pre and post contrast abdominal MRI. This will also allow definitive characterization of the interpolar right renal lesion. These results will be called to the ordering clinician or representative by the  Radiologist Assistant, and communication documented in the PACS or zVision Dashboard.   Electronically Signed  By: KAbigail MiyamotoM.D.  On: 06/16/2015 10:11     Assessment & Plan:    1. Right renal mass 2.6 cm right lower pole exophytic enhancing solid renal neoplasm.  A solid renal mass raises the suspicion of primary renal malignancy.    We discussed this in detail and in regards to the spectrum of renal masses which includes cysts (pure cysts are considered benign), solid  masses and everything in between. The risk of metastasis increases as the size of solid renal mass increases. In general, it is believed that the risk of metastasis for renal masses less than 3-4 cm is small (up to approximately 5%) based mainly on large retrospective studies.  In some cases and especially in patients of older age and multiple comorbidities a surveillance approach may be appropriate. The treatment of solid renal masses includes: surveillance, cryoablation (percutaneous and laparoscopic) in addition to partial and complete nephrectomy (each with option of laparoscopic, robotic and open depending on appropriateness). Furthermore, nephrectomy appears to be an independent risk factor for the development of chronic kidney disease suggesting that nephron sparing approaches should be implored whenever feasible. We reviewed these options in context of the patients current situation as well as the pros and cons of each.  Given the size and location of the mass, do feel that it is feasible to proceed with right robotic partial nephrectomy. She is most interested in this option. Today, we discussed the preoperative, intraoperative, and postoperative course. Risks of surgery including bleeding with possible need for blood transfusion, damage to surrounding structures, infection, urinary leak, delayed bleeding (AV malformation), risk of general anesthesia, and more serious complications including MI, DVT, PE, and death  were discussed. She understands all of this and would like to proceed with right robotic partial nephrectomy. All of her questions were answered in detail today.   2. Overactive bladder Continue Mybetriq.   Schedule right robotic partial nephrectomy   Hollice Espy, MD  [facility] [address], [place], [place] [address] [phone]   I spent 30 min with this patient of which greater than 50% was spent in counseling and coordination of care with the patient.

## 2015-10-23 NOTE — Op Note (Signed)
PREOPERATIVE DIAGNOSIS: Right renal mass  POSTOPERATIVE DIAGNOSIS:  1. Right renal mass.  OPERATION PERFORMED: 1. Robotic assisted laparoscopic right partial nephrectomy. 2. Intraoperative ultrasound with interpretation.  SURGEON: Hollice Espy, MD   Assistant: Phebe Colla, MD  FINDINGS:  Hilar anatomy: 1 artery  1 vein  Warm ischemia time: 17 min.  SPECIMENS: 1. Renal mass with overlying fat  BLOOD LOSS: 100  Drains: 1. JP drain 2. 16 Fr Foley   INDICATIONS FOR OPERATION: Right renal mass   DESCRIPTION OF OPERATION: Informed consent was obtained. The patient was marked on the right side and then taken to the operating room, placed supine on the operating table. General anesthesia was provided. SCDs were provided for DVT prophylaxis and IV antibiotics for bacterial prophylaxis. Foley catheter was placed to drain the bladder. OG tube was placed by anesthesia. The patient was then positioned in left lateral decubitus position with the right flank elevated about 70 degrees. All pressure points were carefully padded. The table was flexed slightly. The right arm was placed in a padded support. The patient was secured to the table with soft chest, hip, and lower extremity straps. The patient was prepped and draped in the usual sterile fashion. We had a time-out confirming the patient identification, the planned procedure, the surgical site, and all present were in agreement.   A Veress needle was placed just lateral to the right rectus belly at the level of the umbilicus. Aspiration revealed return of dark red blood. The Veress was left in place and at this time, the patient remained hemodynamically stable. A second Verus site was selected and this was successfully aspirated, irrigated confirming good position. Opening pressure was less than 9 mmHg. The abdomen was insufflated to 15 mmHg. Following this, trocar sites were mapped out with two robotic trocars triangulating  the expected location of the tumor, a 12 mm trocar between, at about the midclavicular line, for the camera, a 12 mm midline trocar for the assistant just above the umbilicus as well as a 5 mm just below the xiphoid, and an 52m trocar in the right lower quadrant for the 4th arm.  I used a visual obturator to place a 5 mm trocar at one of the robotic port sites, and the peritoneal cavity was surveyed. At this point in time, the previously placed Veress needle was seen penetrating a small amount of omentum with scant active bleeding. There were no injuries to the bowel or any other structures noted. There is no residual active bleeding noted and presumably this had tamponaded itself. The remaining trocars were placed under laparoscopic visualization. I used a CMinette Headlanddevice to place 0 Vicryl sutures at the 12 mm trocar sites for closure at the end of the case. The robot was docked. I placed monopolar shears in the right arm, bipolar in the left, and a double fenestrated grasper in the 4th arm.   The white line of [place] was incised and the right colon was Reflected. The tail of Gerota's fascia was lifted up and the ureter and gonadal vein were identified.  Of note, the ureter was somewhat medially displaced. The gonadal vein was left medial and the ureter was lifted up. The psoas muscle was identified under the ureter and I followed this plane towards the renal hilum. The hilar vessels were identified and exposed circumferentially.     At this point, I incised Gerota's fascia and defatted the kidney in the region of the tumor. Once the tumor was exposed, intraoperative  ultrasound was performed. The mass was imaged and measured, and then excision markings were made. 12.5 gm of mannitol and 10 IV lasix were given, and then bulldog clamps were placed on the renal artery. Vein was left unclamped. Warm ischemia time was clocked. The renal mass was sharply excised and immediately placed  in a 10 mm endocatch bag.  The first layer of the renorrhaphy was closed with running V-Loc Suture x1, anchored at each end with a Lapra-Ty. A sliding clip renorrhaphy was performed to close the outer layer, using interrupted 2-0 vicryl suture, Weck clips, and Lapra-Ty's. The capsule edges were nicely approximated. The bulldog clamps were removed. Warm ischemia time was 17 minutes. Additional 12.5 gm of mannitol was given. All needles were extracted. The kidney was well perfused and the renorrhaphy was hemostatic. I reduced the pneumoperitoneal pressure to 7 mmHg and confirmed hemostasis throughout the surgical field. Surgi-Flo was used over the hilar area in in the bed of the defect. Gerota's fascia was somewhat scant but a small portion was used to cover the lower pole defect using a weck clip and anchoring the kidney in place.   A 19 round JP drain was placed lateral to the kidney. Liver was inspected to rule out injury. Omentum was inspected to make sure no bleeding. Trocars were removed under direct vision. The tumor was extracted and sent for pathology. At the 12 mm trocar sites, the 0 Vicryl sutures that had been placed at the start of the case were tied down securely. The extraction site was closed with running #0 Vicryl sutures. All the wounds were copiously irrigated and then infiltrated with local anesthetic. The skin edges were re approximated with 4-0 Monocryl. Dermabond was applied.   All sponge, needle, and instrument counts were reported correct. The patient was awakened from anesthesia and transferred to recovery in stable condition. There were no complications and the patient tolerated the procedure well. Operative events were discussed with the patient's family.  Hollice Espy, MD

## 2015-10-23 NOTE — Anesthesia Postprocedure Evaluation (Signed)
Anesthesia Post Note  Patient: Caroline Nichols  Procedure(s) Performed: Procedure(s) (LRB): ROBOTIC ASSITED PARTIAL NEPHRECTOMY with intraop ultrasound (Right)  Patient location during evaluation: PACU Anesthesia Type: General Level of consciousness: awake and alert Pain management: pain level controlled Vital Signs Assessment: post-procedure vital signs reviewed and stable Respiratory status: spontaneous breathing and respiratory function stable Cardiovascular status: stable Anesthetic complications: no    Last Vitals:  Filed Vitals:   10/23/15 1635 10/23/15 1640  BP: 170/74 184/77  Pulse: 72 71  Temp:    Resp: 13 18    Last Pain:  Filed Vitals:   10/23/15 1646  PainSc: 0-No pain                 KEPHART,WILLIAM K

## 2015-10-23 NOTE — Transfer of Care (Signed)
Immediate Anesthesia Transfer of Care Note  Patient: Caroline Nichols  Procedure(s) Performed: Procedure(s): ROBOTIC ASSITED PARTIAL NEPHRECTOMY with intraop ultrasound (Right)  Patient Location: PACU  Anesthesia Type:General  Level of Consciousness: confused and responds to stimulation  Airway & Oxygen Therapy: Patient Spontanous Breathing and Patient connected to face mask oxygen  Post-op Assessment: Report given to RN and Post -op Vital signs reviewed and stable  Post vital signs: Reviewed and stable  Last Vitals:  Filed Vitals:   10/23/15 1109 10/23/15 1635  BP: 173/107 170/74  Pulse: 95 72  Temp: 37.6 C   Resp: 16 13    Complications: No apparent anesthesia complications

## 2015-10-23 NOTE — Anesthesia Preprocedure Evaluation (Signed)
Anesthesia Evaluation  Patient identified by MRN, date of birth, ID band Patient awake    Reviewed: Allergy & Precautions, H&P , NPO status , Patient's Chart, lab work & pertinent test results, reviewed documented beta blocker date and time   Airway Mallampati: II  TM Distance: >3 FB Neck ROM: full    Dental no notable dental hx.    Pulmonary neg pulmonary ROS, former smoker,    Pulmonary exam normal        Cardiovascular Exercise Tolerance: Good hypertension, Pt. on medications negative cardio ROS Normal cardiovascular exam     Neuro/Psych    GI/Hepatic Neg liver ROS, GERD  Medicated and Controlled,  Endo/Other    Renal/GU   negative genitourinary   Musculoskeletal   Abdominal Normal abdominal exam  (+)   Peds  Hematology negative hematology ROS (+)   Anesthesia Other Findings   Reproductive/Obstetrics negative OB ROS                             Anesthesia Physical Anesthesia Plan  ASA: II  Anesthesia Plan: General   Post-op Pain Management:    Induction: Intravenous  Airway Management Planned: Oral ETT  Additional Equipment:   Intra-op Plan:   Post-operative Plan: Extubation in OR  Informed Consent: I have reviewed the patients History and Physical, chart, labs and discussed the procedure including the risks, benefits and alternatives for the proposed anesthesia with the patient or authorized representative who has indicated his/her understanding and acceptance.   Dental Advisory Given  Plan Discussed with: CRNA and Surgeon  Anesthesia Plan Comments:         Anesthesia Quick Evaluation

## 2015-10-24 ENCOUNTER — Encounter: Payer: Self-pay | Admitting: Urology

## 2015-10-24 LAB — BASIC METABOLIC PANEL
Anion gap: 5 (ref 5–15)
BUN: 29 mg/dL — AB (ref 6–20)
CHLORIDE: 108 mmol/L (ref 101–111)
CO2: 23 mmol/L (ref 22–32)
Calcium: 7.2 mg/dL — ABNORMAL LOW (ref 8.9–10.3)
Creatinine, Ser: 1.37 mg/dL — ABNORMAL HIGH (ref 0.44–1.00)
GFR calc Af Amer: 43 mL/min — ABNORMAL LOW (ref >60)
GFR calc non Af Amer: 37 mL/min — ABNORMAL LOW (ref >60)
Glucose, Bld: 157 mg/dL — ABNORMAL HIGH (ref 65–99)
Potassium: 4.1 mmol/L (ref 3.5–5.1)
SODIUM: 136 mmol/L (ref 135–145)

## 2015-10-24 LAB — CBC
HCT: 27.2 % — ABNORMAL LOW (ref 35.0–47.0)
Hemoglobin: 8.8 g/dL — ABNORMAL LOW (ref 12.0–16.0)
MCH: 28.8 pg (ref 26.0–34.0)
MCHC: 32.2 g/dL (ref 32.0–36.0)
MCV: 89.5 fL (ref 80.0–100.0)
PLATELETS: 136 10*3/uL — AB (ref 150–440)
RBC: 3.04 MIL/uL — ABNORMAL LOW (ref 3.80–5.20)
RDW: 17.2 % — AB (ref 11.5–14.5)
WBC: 25.4 10*3/uL — ABNORMAL HIGH (ref 3.6–11.0)

## 2015-10-24 LAB — CREATININE, FLUID (PLEURAL, PERITONEAL, JP DRAINAGE): CREAT FL: 1.2 mg/dL

## 2015-10-24 LAB — HEMOGLOBIN AND HEMATOCRIT, BLOOD
HEMATOCRIT: 24.2 % — AB (ref 35.0–47.0)
HEMOGLOBIN: 7.8 g/dL — AB (ref 12.0–16.0)

## 2015-10-24 LAB — PREPARE RBC (CROSSMATCH)

## 2015-10-24 MED ORDER — SODIUM CHLORIDE 0.9 % IV SOLN: Freq: Once | INTRAVENOUS | Status: DC

## 2015-10-24 NOTE — Progress Notes (Signed)
MD paged regarding drop in HGB. Orders put in to transfuse 1 unit. Also had to put in order for type and screen because it had not been done in less than three days per the policy. Consent will be signed.

## 2015-10-24 NOTE — Progress Notes (Signed)
MD notified of low urine output (83m) per bladder scan. Orders given to insert foley and to let the Night shift RN know that if anything changes to let Dr. BErlene Quanknow and NOT the on call.

## 2015-10-24 NOTE — Progress Notes (Signed)
1 Day Post-Op Subjective: The patient is doing well.  No nausea or vomiting. Pain is adequately controlled.  Objective: Vital signs in last 24 hours: Temp:  [97.2 F (36.2 C)-98.9 F (37.2 C)] 98.8 F (37.1 C) (03/21 0911) Pulse Rate:  [68-107] 107 (03/21 0911) Resp:  [10-21] 18 (03/21 0911) BP: (102-184)/(61-83) 106/66 mmHg (03/21 0911) SpO2:  [88 %-100 %] 91 % (03/21 0911) Arterial Line BP: (173-193)/(75-84) 193/83 mmHg (03/20 1650) Weight:  [149 lb 6.4 oz (67.767 kg)] 149 lb 6.4 oz (67.767 kg) (03/20 1745)  Intake/Output from previous day: 03/20 0701 - 03/21 0700 In: 2895 [I.V.:2895] Out: 1791 [Urine:1625; Drains:116; Blood:50] Intake/Output this shift: Total I/O In: 1304 [P.O.:1000; I.V.:304] Out: 160 [Urine:150; Drains:10]  Physical Exam:  General: Alert and oriented. CV: RRR Lungs: Clear bilaterally. GI: Soft, Nondistended. Incisions: Clean and dry.  JP with serosanguinous fluid.   Urine: Clear, Foley in place.  Clear yellow urine.   Extremities: Nontender, no erythema, no edema.  Lab Results:  Recent Labs  10/23/15 1647 10/24/15 0617  HGB 11.2* 8.8*  HCT 33.7* 27.2*          Recent Labs  10/23/15 1647 10/24/15 0617  CREATININE 0.87 1.37*           Results for orders placed or performed during the hospital encounter of 10/23/15 (from the past 24 hour(s))  Glucose, capillary     Status: Abnormal   Collection Time: 10/23/15  4:41 PM  Result Value Ref Range   Glucose-Capillary 101 (H) 65 - 99 mg/dL  Basic metabolic panel     Status: Abnormal   Collection Time: 10/23/15  4:47 PM  Result Value Ref Range   Sodium 139 135 - 145 mmol/L   Potassium 3.9 3.5 - 5.1 mmol/L   Chloride 111 101 - 111 mmol/L   CO2 24 22 - 32 mmol/L   Glucose, Bld 136 (H) 65 - 99 mg/dL   BUN 26 (H) 6 - 20 mg/dL   Creatinine, Ser 0.87 0.44 - 1.00 mg/dL   Calcium 7.9 (L) 8.9 - 10.3 mg/dL   GFR calc non Af Amer >60 >60 mL/min   GFR calc Af Amer >60 >60 mL/min   Anion gap 4 (L)  5 - 15  CBC     Status: Abnormal   Collection Time: 10/23/15  4:47 PM  Result Value Ref Range   WBC 7.0 3.6 - 11.0 K/uL   RBC 3.79 (L) 3.80 - 5.20 MIL/uL   Hemoglobin 11.2 (L) 12.0 - 16.0 g/dL   HCT 33.7 (L) 35.0 - 47.0 %   MCV 88.9 80.0 - 100.0 fL   MCH 29.5 26.0 - 34.0 pg   MCHC 33.2 32.0 - 36.0 g/dL   RDW 17.0 (H) 11.5 - 14.5 %   Platelets 99 (L) 150 - 440 K/uL  CBC     Status: Abnormal   Collection Time: 10/24/15  6:17 AM  Result Value Ref Range   WBC 25.4 (H) 3.6 - 11.0 K/uL   RBC 3.04 (L) 3.80 - 5.20 MIL/uL   Hemoglobin 8.8 (L) 12.0 - 16.0 g/dL   HCT 27.2 (L) 35.0 - 47.0 %   MCV 89.5 80.0 - 100.0 fL   MCH 28.8 26.0 - 34.0 pg   MCHC 32.2 32.0 - 36.0 g/dL   RDW 17.2 (H) 11.5 - 14.5 %   Platelets 136 (L) 150 - 440 K/uL  Basic metabolic panel     Status: Abnormal   Collection Time: 10/24/15  6:17  AM  Result Value Ref Range   Sodium 136 135 - 145 mmol/L   Potassium 4.1 3.5 - 5.1 mmol/L   Chloride 108 101 - 111 mmol/L   CO2 23 22 - 32 mmol/L   Glucose, Bld 157 (H) 65 - 99 mg/dL   BUN 29 (H) 6 - 20 mg/dL   Creatinine, Ser 1.37 (H) 0.44 - 1.00 mg/dL   Calcium 7.2 (L) 8.9 - 10.3 mg/dL   GFR calc non Af Amer 37 (L) >60 mL/min   GFR calc Af Amer 43 (L) >60 mL/min   Anion gap 5 5 - 15  Creatinine, body fluid     Status: None   Collection Time: 10/24/15  7:54 AM  Result Value Ref Range   Creat, Fluid 1.2 mg/dL   Fluid Type-FCRE JP DRAINAGE     Assessment/Plan: POD# 1 s/p robotic partial nephrectomy.  1) Ambulate, Incentive spirometry 2) Advance diet as tolerated 3) Transition to oral pain medication 4) D/C Foley 5) Drain Cr today 6) Repeat H/H this afternoon   Hollice Espy, MD   LOS: 1 day   Hollice Espy 10/24/2015, 2:14 PM

## 2015-10-24 NOTE — Progress Notes (Signed)
Lab called and stated they could use type and screen from earlier date, Lab tech stated she was going to work on getting the blood ready and it would be ready in about 5 minutes.

## 2015-10-25 LAB — HEMOGLOBIN AND HEMATOCRIT, BLOOD
HCT: 25.1 % — ABNORMAL LOW (ref 35.0–47.0)
Hemoglobin: 8.1 g/dL — ABNORMAL LOW (ref 12.0–16.0)

## 2015-10-25 LAB — TYPE AND SCREEN
ABO/RH(D): O POS
Antibody Screen: NEGATIVE
UNIT DIVISION: 0
UNIT DIVISION: 0
Unit division: 0

## 2015-10-25 LAB — BASIC METABOLIC PANEL
ANION GAP: 4 — AB (ref 5–15)
BUN: 33 mg/dL — ABNORMAL HIGH (ref 6–20)
CHLORIDE: 108 mmol/L (ref 101–111)
CO2: 22 mmol/L (ref 22–32)
Calcium: 7.8 mg/dL — ABNORMAL LOW (ref 8.9–10.3)
Creatinine, Ser: 1.18 mg/dL — ABNORMAL HIGH (ref 0.44–1.00)
GFR calc Af Amer: 51 mL/min — ABNORMAL LOW (ref >60)
GFR, EST NON AFRICAN AMERICAN: 44 mL/min — AB (ref >60)
GLUCOSE: 125 mg/dL — AB (ref 65–99)
POTASSIUM: 4.1 mmol/L (ref 3.5–5.1)
Sodium: 134 mmol/L — ABNORMAL LOW (ref 135–145)

## 2015-10-25 LAB — CBC
HCT: 27.1 % — ABNORMAL LOW (ref 35.0–47.0)
HEMOGLOBIN: 9 g/dL — AB (ref 12.0–16.0)
MCH: 29.9 pg (ref 26.0–34.0)
MCHC: 33 g/dL (ref 32.0–36.0)
MCV: 90.4 fL (ref 80.0–100.0)
PLATELETS: 84 10*3/uL — AB (ref 150–440)
RBC: 3 MIL/uL — AB (ref 3.80–5.20)
RDW: 16.4 % — ABNORMAL HIGH (ref 11.5–14.5)
WBC: 27 10*3/uL — AB (ref 3.6–11.0)

## 2015-10-25 MED ORDER — KCL IN DEXTROSE-NACL 20-5-0.45 MEQ/L-%-% IV SOLN
INTRAVENOUS | Status: DC
  Administered 2015-10-25 – 2015-10-27 (×4): via INTRAVENOUS
  Filled 2015-10-25 (×5): qty 1000

## 2015-10-25 NOTE — Plan of Care (Signed)
Problem: Activity: Goal: Ability to tolerate increased activity will improve Outcome: Progressing Pt walked with pt today

## 2015-10-25 NOTE — Progress Notes (Signed)
2 Days Post-Op Subjective: Patient became mildly tachycardic yesterday up to 114 in the afternoon. Rechecked hemoglobin/hematocrit seen and examined. Due to downward trending hemoglobin and history of anemia, she was transfused 1 unit packed red blood cells overnight.    Complaining of mild abdominal distention and nausea. No flatus. Tachycardia improved this a.m. Appropriate pump in hemoglobin.    No other complaints. Not yet ambulatory.  Foley catheter placed yesterday evening after low urine for monitoring.  JP creatinine consistent with serum.  Objective: Vital signs in last 24 hours: Temp:  [97.8 F (36.6 C)-98.8 F (37.1 C)] 98.8 F (37.1 C) (03/22 0957) Pulse Rate:  [103-114] 110 (03/22 0957) Resp:  [14-20] 18 (03/22 0957) BP: (89-142)/(53-63) 142/53 mmHg (03/22 0957) SpO2:  [85 %-100 %] 98 % (03/22 0957)  Intake/Output from previous day: 03/21 0701 - 03/22 0700 In: 3176 [P.O.:1480; I.V.:1376; Blood:320] Out: 905 [Urine:850; Drains:55] Intake/Output this shift: Total I/O In: 360 [P.O.:360] Out: 355 [Urine:350; Drains:5]  Physical Exam:  General: Alert and oriented. CV: RRR Lungs: Clear bilaterally. GI: Soft, mildy distended, nontender. Incisions: Clean and dry. JP with scant serosanguineous fluid. Urine: Clear, Foley in place draining clear yellow urine.   Extremities: Nontender, no erythema, no edema.  Lab Results:  Recent Labs  10/24/15 0617 10/24/15 1451 10/25/15 0513  HGB 8.8* 7.8* 9.0*  HCT 27.2* 24.2* 27.1*          Recent Labs  10/23/15 1647 10/24/15 0617 10/25/15 0513  CREATININE 0.87 1.37* 1.18*           Results for orders placed or performed during the hospital encounter of 10/23/15 (from the past 24 hour(s))  Hemoglobin and hematocrit, blood     Status: Abnormal   Collection Time: 10/24/15  2:51 PM  Result Value Ref Range   Hemoglobin 7.8 (L) 12.0 - 16.0 g/dL   HCT 24.2 (L) 35.0 - 47.0 %  Prepare RBC     Status: None   Collection  Time: 10/24/15  5:08 PM  Result Value Ref Range   Order Confirmation ORDER PROCESSED BY BLOOD BANK   CBC     Status: Abnormal   Collection Time: 10/25/15  5:13 AM  Result Value Ref Range   WBC 27.0 (H) 3.6 - 11.0 K/uL   RBC 3.00 (L) 3.80 - 5.20 MIL/uL   Hemoglobin 9.0 (L) 12.0 - 16.0 g/dL   HCT 27.1 (L) 35.0 - 47.0 %   MCV 90.4 80.0 - 100.0 fL   MCH 29.9 26.0 - 34.0 pg   MCHC 33.0 32.0 - 36.0 g/dL   RDW 16.4 (H) 11.5 - 14.5 %   Platelets 84 (L) 150 - 440 K/uL  Basic metabolic panel     Status: Abnormal   Collection Time: 10/25/15  5:13 AM  Result Value Ref Range   Sodium 134 (L) 135 - 145 mmol/L   Potassium 4.1 3.5 - 5.1 mmol/L   Chloride 108 101 - 111 mmol/L   CO2 22 22 - 32 mmol/L   Glucose, Bld 125 (H) 65 - 99 mg/dL   BUN 33 (H) 6 - 20 mg/dL   Creatinine, Ser 1.18 (H) 0.44 - 1.00 mg/dL   Calcium 7.8 (L) 8.9 - 10.3 mg/dL   GFR calc non Af Amer 44 (L) >60 mL/min   GFR calc Af Amer 51 (L) >60 mL/min   Anion gap 4 (L) 5 - 15    Assessment/Plan: POD# 2 s/p robotic partial nephrectomy.  1) acute blood loss anemia-appropriate bump in  hematocrit following 1 unit packed red blood cell transfusion. Will continue to monitor.  Continue tele.  2) leukocytosis- rising without fevers/ chills.  Will defer ID work up at this time, continue to trend unless spikes fever  3) abdominal distention/ nausea- suspect mild ileus. Return to clears and advance as tolerated.  Resume fluids at low rate (35m/hr)  4) low UOP- Will keep Foley in place, monitor urine output today  5) dispo- PT ordered to ambulate patient  AHollice Espy MD   LOS: 2 days   AHollice Espy3/22/2017, 12:27 PM

## 2015-10-25 NOTE — Care Management Important Message (Signed)
Important Message  Patient Details  Name: Caroline Nichols MRN: [id number] Date of Birth: 1941/02/12   Medicare Important Message Given:  Yes    Juliann Pulse A Cage Gupton 10/25/2015, 2:05 PM

## 2015-10-25 NOTE — Progress Notes (Signed)
Called Dr. Erlene Quan about patient's 5 beat run of svt with heart rate of 136.  Doctor aware that patient had no complications with blood transfusion.  Blood pressure is 120/61 and pt is voiding through foley.  Phoebe Sharps N 10/25/2015 1:12 AM

## 2015-10-25 NOTE — Evaluation (Signed)
Physical Therapy Evaluation Patient Details Name: Caroline Nichols MRN: [id number] DOB: 05/03/1941 Today's Date: 10/25/2015   History of Present Illness  Pt underwent robotic parital R nephrectomy secondary to R renal mass and is POD#2 at time of PT evaluation. RN reports that pt has been up walking a very limited amount earlier today with staff. Pt denies falls in the last 12 months. At baseline she lives in [place] at [facility] and is independent with all ADLs. Pt eats meals at facility.  Clinical Impression  Pt reporting no pain at rest but pain increases with all mobility. She is easily fatigued with general deconditioning evident post-operatively. SaO2 is 83% on room air upon arrival so pt placed back on supplemental O2 and RN notified. Maintained supplemental O2 during entire PT evaluation and SaO2 remains >90% throughout ambulation. Pt ambulates with very slow and guarded gait requiring frequent rest breaks due to fatigue and pain. Currently she is unsafe to return to ILF and will need SNF placement at discharge to facilitate return to prior level of function. Pt will benefit from skilled PT services to address deficits in strength, balance, and mobility in order to return to full function at home.     Follow Up Recommendations SNF    Equipment Recommendations  None recommended by PT    Recommendations for Other Services       Precautions / Restrictions Precautions Precautions: Fall Restrictions Weight Bearing Restrictions: No      Mobility  Bed Mobility Overal bed mobility: Needs Assistance Bed Mobility: Sit to Supine       Sit to supine: Mod assist   General bed mobility comments: Pt requires assist for bilateral LE when returning to bed due to incisional pain. +2 to scoot patient up toward [facility]  Transfers Overall transfer level: Needs assistance Equipment used: Rolling walker (2 wheeled) Transfers: Sit to/from Stand Sit to Stand: Min guard         General transfer  comment: Pt able to perform without physical assist but completes very slowly due to increase in incisional pain with movement. Once standing pt is steady with UE support  Ambulation/Gait Ambulation/Gait assistance: Min guard Ambulation Distance (Feet): 60 Feet Assistive device: Rolling walker (2 wheeled) Gait Pattern/deviations: Decreased step length - right;Decreased step length - left Gait velocity: Decreased Gait velocity interpretation: <1.8 ft/sec, indicative of risk for recurrent falls General Gait Details: Pt with short guarded steps. SaO2 83% on room air upon arrival, RN notified. Pt placed on 3L/min O2 and maintained throughout PT evaluation. SaO2 remains at or above 90% on supplemental O2 during gait. HR approximately 103-105 bpm at rest and only increases to 110 during ambulation. No reported chest pain. Fatigue monitored and distance limited accordingly. Pt requires multiple standing rest breaks and ambulates very slowly.  Stairs            Wheelchair Mobility    Modified Rankin (Stroke Patients Only)       Balance Overall balance assessment: Needs assistance Sitting-balance support: No upper extremity supported Sitting balance-Leahy Scale: Good     Standing balance support: Bilateral upper extremity supported Standing balance-Leahy Scale: Fair                 High Level Balance Comments: Full balance screening deferred due to fatigue and post-operative pain             Pertinent Vitals/Pain Pain Assessment: 0-10 Pain Score: 0-No pain Pain Location: Pt denies surgical pain at this time. Reports  chronic OA pain but not related to admission Pain Intervention(s): Monitored during session    [place] expects to be discharged to:: Unsure Living Arrangements: Alone Available Help at Discharge: Family Type of Home: Independent living facility         Home Equipment: Kasandra Knudsen - single point;Walker - 2 wheels;Shower seat;Grab bars -  toilet;Grab bars - tub/shower (NO BSC)      Prior Function Level of Independence: Needs assistance   Gait / Transfers Assistance Needed: Use of rolling walker vs spc for ambulation  ADL's / Homemaking Assistance Needed: Independent with ADLs but assist for IADLs. Eats meals at dining facility of ILF        Hand Dominance   Dominant Hand: Right    Extremity/Trunk Assessment   Upper Extremity Assessment: Overall WFL for tasks assessed (Difficult to assess due to apin)           Lower Extremity Assessment: Generalized weakness         Communication   Communication: No difficulties  Cognition Arousal/Alertness: Awake/alert Behavior During Therapy: WFL for tasks assessed/performed Overall Cognitive Status: Within Functional Limits for tasks assessed                      General Comments      Exercises        Assessment/Plan    PT Assessment Patient needs continued PT services  PT Diagnosis Difficulty walking;Abnormality of gait;Generalized weakness;Acute pain   PT Problem List Decreased strength;Decreased activity tolerance;Decreased mobility;Pain  PT Treatment Interventions DME instruction;Gait training;Therapeutic activities;Therapeutic exercise;Balance training;Neuromuscular re-education;Patient/family education   PT Goals (Current goals can be found in the Care Plan section) Acute Rehab PT Goals Patient Stated Goal: Return to prior level of function at home PT Goal Formulation: With patient/family Time For Goal Achievement: 11/08/15 Potential to Achieve Goals: Good    Frequency Min 2X/week   Barriers to discharge Decreased caregiver support Lives alone    Co-evaluation               End of Session Equipment Utilized During Treatment: Gait belt;Oxygen Activity Tolerance: Patient tolerated treatment well Patient left: in bed;with call bell/phone within reach;with bed alarm set;with nursing/sitter in room;with family/visitor present;with  SCD's reapplied Nurse Communication: Mobility status;Patient requests pain meds         Time: 4580-9983 PT Time Calculation (min) (ACUTE ONLY): 32 min   Charges:   PT Evaluation $PT Eval Moderate Complexity: 1 Procedure PT Treatments $Gait Training: 8-22 mins   PT G Codes:       Lyndel Safe Dorian Renfro PT, DPT   Aubriella Perezgarcia 10/25/2015, 3:57 PM

## 2015-10-26 ENCOUNTER — Inpatient Hospital Stay: Payer: PPO

## 2015-10-26 LAB — CBC
HCT: 25.9 % — ABNORMAL LOW (ref 35.0–47.0)
HEMOGLOBIN: 8.6 g/dL — AB (ref 12.0–16.0)
MCH: 30 pg (ref 26.0–34.0)
MCHC: 33.2 g/dL (ref 32.0–36.0)
MCV: 90.3 fL (ref 80.0–100.0)
PLATELETS: 84 10*3/uL — AB (ref 150–440)
RBC: 2.87 MIL/uL — AB (ref 3.80–5.20)
RDW: 16.2 % — ABNORMAL HIGH (ref 11.5–14.5)
WBC: 24.6 10*3/uL — AB (ref 3.6–11.0)

## 2015-10-26 MED ORDER — ONDANSETRON 4 MG PO TBDP
4.0000 mg | ORAL_TABLET | ORAL | Status: DC | PRN
  Administered 2015-10-26: 4 mg via ORAL
  Filled 2015-10-26 (×3): qty 1

## 2015-10-26 MED ORDER — BISACODYL 10 MG RE SUPP
10.0000 mg | Freq: Once | RECTAL | Status: AC
  Administered 2015-10-26: 10 mg via RECTAL
  Filled 2015-10-26: qty 1

## 2015-10-26 MED ORDER — BISACODYL 10 MG RE SUPP
10.0000 mg | Freq: Once | RECTAL | Status: DC
  Filled 2015-10-26: qty 1

## 2015-10-26 NOTE — Progress Notes (Addendum)
Pt complained of not feeling good and stated that she may feel better if she could have a BM. Pt got up to BR and sat for a while but was unable to have a BM. Pt also complaining of more pain this shift. Morphine  '2mg'$  admin. With little results as pt walked to the BR. A second dose of Morphine offered but Pt wanted to wait to see if pain goes away after laying in the bed. Pt still complaining of discomfort in the lower abd., BM suppository admin followed by percocet 2 pills. Pt noted that she felt somewhat better this AM.

## 2015-10-26 NOTE — Clinical Social Work Note (Signed)
Clinical Social Work Assessment  Patient Details  Name: Caroline Nichols MRN: [id number] Date of Birth: Oct 27, 1940  Date of referral:  10/26/15               Reason for consult:  Facility Placement                Permission sought to share information with:  Facility Sport and exercise psychologist, Family Supports Permission granted to share information::  Yes, Verbal Permission Granted  Name::        Agency::     Relationship::     Contact Information:     Housing/Transportation Living arrangements for the past 2 months:  Single Family Home Source of Information:  Patient Patient Interpreter Needed:  None Criminal Activity/Legal Involvement Pertinent to Current Situation/Hospitalization:  No - Comment as needed Significant Relationships:  Adult Children Lives with:  Self Do you feel safe going back to the place where you live?  Yes Need for family participation in patient care:  Yes (Comment)  Care giving concerns:  Patient lives at home alone.   Social Worker assessment / plan:  PT had recommended STR. CSW met with patient briefly this afternoon due to patient being in pain and grimacing. Patient was willing to let CSW introduce self and explain purpose of visit. Patient states she does not feel as though she has a choice because she is so weak. CSW to initiate bedsearch.  Employment status:  Retired Nurse, adult PT Recommendations:  Agency / Referral to community resources:  [facility]  Patient/Family's Response to care:  Patient appreciative of [place] visit. Patient/Family's Understanding of and Emotional Response to Diagnosis, Current Treatment, and Prognosis:  Patient having noticeable discomfort during assessment and thus assessment made brief.  Emotional Assessment Appearance:  Appears stated age Attitude/Demeanor/Rapport:   (pleasant and cooperative but obviously in pain) Affect (typically observed):   Accepting, Adaptable, Calm Orientation:  Oriented to Self, Oriented to Place, Oriented to Situation, Oriented to  Time Alcohol / Substance use:  Not Applicable Psych involvement (Current and /or in the community):  No (Comment)  Discharge Needs  Concerns to be addressed:  Care Coordination Readmission within the last 30 days:  No Current discharge risk:  None Barriers to Discharge:  No Barriers Identified   Shela Leff, LCSW 10/26/2015, 4:07 PM

## 2015-10-26 NOTE — Progress Notes (Addendum)
Notified Dr. Erlene Quan of results of bladder scan. She said to hook patient back up to fluids at 79m as previously running. Per Dr. BErlene Quanplace foley catheter at 8pm if patient has not voided.Also place order for pt to be NPO with no oral pain meds given.

## 2015-10-26 NOTE — Progress Notes (Addendum)
Patient experiencing nausea IV not working correctly. Per Dr. Erlene Quan okay to place order for zofran '4mg'$  sublingual q 4hrs prn. Notify Dr. Erlene Quan if patient does throw up

## 2015-10-26 NOTE — NC FL2 (Signed)
[place] LEVEL OF CARE SCREENING TOOL     IDENTIFICATION  Patient Name: Caroline Nichols Birthdate: [birth year] Sex: female Admission Date (Current Location): 10/23/2015  [place] and [place] Number:  Engineering geologist and Address:  [facility], [address] [place], [place] [address]      Provider Number: [id number]  Attending Physician Name and Address:  Hollice Espy, MD  Relative Name and Phone Number:       Current Level of Care: Hospital Recommended Level of Care: [place] Prior Approval Number:    Date Approved/Denied:   PASRR Number:    Discharge Plan: SNF    Current Diagnoses: Patient Active Problem List   Diagnosis Date Noted  . Right renal mass 10/23/2015  . Preoperative evaluation of a medical condition to rule out surgical contraindications (TAR required) 10/12/2015  . Renal mass 10/03/2015  . Urinary frequency 07/28/2015  . Absolute anemia 05/10/2015  . Arthritis 05/10/2015  . Ache in joint 05/10/2015  . Nocturia 04/16/2015  . Neutropenia ([place]) 04/16/2015  . Hyperlipidemia 12/17/2014  . Depression 12/14/2014  . Xerostomia 11/14/2014  . Hyperglycemia 06/12/2014  . Diabetes insipidus ([place]) 06/12/2014  . S/P hysterectomy 06/09/2014  . Elevated CK 03/02/2014  . Decreased leukocytes 03/02/2014  . Medication management 11/11/2013  . Atherosclerotic peripheral vascular disease with intermittent claudication ([place]) 10/07/2013  . Edema of both legs 10/07/2013  . Venous stasis dermatitis 10/07/2013  . Chest pain 03/26/2013  . Left carotid bruit 03/26/2013  . Anemia of chronic disease 03/20/2013  . Polyarthralgia 03/20/2013  . Hypothyroidism 03/10/2013  . Bradycardia 12/09/2012  . Cervical vertebral fusion 12/09/2012  . Hot flash, menopausal 12/09/2012  . Gastrointestinal hemorrhage 12/09/2012  . Effusion of knee 12/09/2012  . Neuropathy ([place]) 12/09/2012  . Diuresis excessive 12/09/2012  .  Compulsive tobacco user syndrome 12/09/2012  . Decreased body weight 12/09/2012  . Blush 12/09/2012  . Current tobacco use 12/09/2012  . Abnormal weight loss 12/09/2012  . IBS (irritable bowel syndrome)   . Degenerative arthritis of lumbar spine   . MURMUR 09/27/2009  . Postablative hypothyroidism 05/15/2009  . Anxiety state 05/15/2009  . Essential hypertension 05/15/2009    [place] RESPIRATION BLADDER Height & Weight     Self, Time, Situation, Place  Normal Continent Weight: 149 lb 6.4 oz (67.767 kg) Height:  '5\' 2"'$  (157.5 cm)  BEHAVIORAL SYMPTOMS/MOOD NEUROLOGICAL BOWEL NUTRITION STATUS   (none)  (none) Continent Diet (clear liquid and to be advanced)  AMBULATORY STATUS COMMUNICATION OF NEEDS Skin   Supervision Verbally Surgical wounds                       Personal Care Assistance Level of Assistance  Bathing, Dressing Bathing Assistance: Limited assistance   Dressing Assistance: Limited assistance     Functional Limitations Info             SPECIAL CARE FACTORS FREQUENCY  PT (By licensed PT)                    Contractures Contractures Info: Not present    Additional Factors Info                  Current Medications (10/26/2015):  This is the current hospital active medication list Current Facility-Administered Medications  Medication Dose Route Frequency Provider Last Rate Last Dose  . 0.9 %  sodium chloride infusion   Intravenous Once Hollice Espy, MD      .  acetaminophen (TYLENOL) tablet 650 mg  650 mg Oral Q4H PRN Hollice Espy, MD      . ALPRAZolam Duanne Moron) tablet 0.5 mg  0.5 mg Oral BID PRN Hollice Espy, MD      . bisacodyl (DULCOLAX) suppository 10 mg  10 mg Rectal Once Hollice Espy, MD   10 mg at 10/26/15 0445  . dextrose 5 % and 0.45 % NaCl with KCl 20 mEq/L infusion   Intravenous Continuous Hollice Espy, MD 75 mL/hr at 10/26/15 0205    . diphenhydrAMINE (BENADRYL) injection 12.5 mg  12.5 mg Intravenous Q6H PRN Hollice Espy, MD       Or  . diphenhydrAMINE (BENADRYL) 12.5 MG/5ML elixir 12.5 mg  12.5 mg Oral Q6H PRN Hollice Espy, MD      . docusate sodium (COLACE) capsule 100 mg  100 mg Oral BID Hollice Espy, MD   100 mg at 10/26/15 1052  . fluticasone (FLONASE) 50 MCG/ACT nasal spray 2 spray  2 spray Each Nare Daily Hollice Espy, MD   2 spray at 10/26/15 1051  . gabapentin (NEURONTIN) capsule 300 mg  300 mg Oral TID Hollice Espy, MD   300 mg at 10/26/15 1052  . hydrALAZINE (APRESOLINE) tablet 10 mg  10 mg Oral BID Hollice Espy, MD   10 mg at 10/26/15 1053  . hydroxychloroquine (PLAQUENIL) tablet 400 mg  400 mg Oral Daily Hollice Espy, MD   400 mg at 10/26/15 1053  . ipratropium (ATROVENT) 0.03 % nasal spray 1 spray  1 spray Each Nare BID Hollice Espy, MD   1 spray at 10/25/15 2220  . levothyroxine (SYNTHROID, LEVOTHROID) tablet 112 mcg  112 mcg Oral QAC breakfast Hollice Espy, MD   112 mcg at 10/26/15 0818  . losartan (COZAAR) tablet 50 mg  50 mg Oral Daily Hollice Espy, MD   50 mg at 10/26/15 1053  . mirtazapine (REMERON) tablet 7.5 mg  7.5 mg Oral QHS Hollice Espy, MD   7.5 mg at 10/25/15 2217  . morphine 2 MG/ML injection 2-4 mg  2-4 mg Intravenous Q2H PRN Hollice Espy, MD   2 mg at 10/26/15 1542  . ondansetron (ZOFRAN) injection 4 mg  4 mg Intravenous Q4H PRN Hollice Espy, MD      . ondansetron (ZOFRAN-ODT) disintegrating tablet 4 mg  4 mg Oral Q4H PRN Hollice Espy, MD   4 mg at 10/26/15 1259  . opium-belladonna (B&O SUPPRETTES) 16.2-60 MG suppository 1 suppository  1 suppository Rectal Q6H PRN Hollice Espy, MD   1 suppository at 10/26/15 0446  . oxybutynin (DITROPAN) tablet 5 mg  5 mg Oral Q8H PRN Hollice Espy, MD      . oxyCODONE-acetaminophen (PERCOCET/ROXICET) 5-325 MG per tablet 1-2 tablet  1-2 tablet Oral Q4H PRN Hollice Espy, MD   2 tablet at 10/26/15 1329  . pantoprazole (PROTONIX) EC tablet 40 mg  40 mg Oral Daily Hollice Espy, MD   40 mg at 10/26/15 1052      Discharge Medications: Please see discharge summary for a list of discharge medications.  Relevant Imaging Results:  Relevant Lab Results:   Additional Information SS: [id number]  Shela Leff, LCSW

## 2015-10-26 NOTE — Progress Notes (Signed)
Notified Dr. Erlene Quan that pt is throwing up brown emesis. Per MD place order for KUB and call with results.

## 2015-10-26 NOTE — Progress Notes (Signed)
Notified Dr. Erlene Quan that patient is experencing increased pain. Pt also voiced concern about chest x-ray. Per dr. Erlene Quan do not place order for chest x-ray at this time. Place order for dulcolax suppository x 1 to see if this helps with symptoms.

## 2015-10-26 NOTE — Progress Notes (Signed)
3 Days Post-Op Subjective: Remains afebrile.  No flatus.  Complaining of abdominal distention without nausea, tolerating clears.  PT yesterday, recommend rehab.  CBC stable.    Objective: Vital signs in last 24 hours: Temp:  [97.9 F (36.6 C)-99.3 F (37.4 C)] 99.3 F (37.4 C) (03/23 0436) Pulse Rate:  [97-110] 99 (03/23 0436) Resp:  [16-20] 20 (03/23 0436) BP: (107-142)/(53-66) 125/61 mmHg (03/23 0436) SpO2:  [91 %-100 %] 98 % (03/23 0436)  Intake/Output from previous day: 03/22 0701 - 03/23 0700 In: 2376.5 [P.O.:1120; I.V.:1256.5] Out: 2210 [Urine:2200; Drains:10] Intake/Output this shift: Total I/O In: 209 [I.V.:209] Out: 0   Physical Exam:  General: Alert and oriented. CV: RRR.   Lungs: Clear bilaterally.  Wearing 02 (3 L) GI: Soft, mildy distended (stable), nontender. Incisions: Clean and dry. JP with scant serosanguineous fluid. Urine: Clear, Foley in place draining clear yellow urine.   Extremities: Nontender, no erythema, no edema.  Lab Results:  Recent Labs  10/25/15 0513 10/25/15 1606 10/26/15 0359  HGB 9.0* 8.1* 8.6*  HCT 27.1* 25.1* 25.9*           Recent Labs  10/23/15 1647 10/24/15 0617 10/25/15 0513  CREATININE 0.87 1.37* 1.18*           Results for orders placed or performed during the hospital encounter of 10/23/15 (from the past 24 hour(s))  Hemoglobin and hematocrit, blood     Status: Abnormal   Collection Time: 10/25/15  4:06 PM  Result Value Ref Range   Hemoglobin 8.1 (L) 12.0 - 16.0 g/dL   HCT 25.1 (L) 35.0 - 47.0 %  CBC     Status: Abnormal   Collection Time: 10/26/15  3:59 AM  Result Value Ref Range   WBC 24.6 (H) 3.6 - 11.0 K/uL   RBC 2.87 (L) 3.80 - 5.20 MIL/uL   Hemoglobin 8.6 (L) 12.0 - 16.0 g/dL   HCT 25.9 (L) 35.0 - 47.0 %   MCV 90.3 80.0 - 100.0 fL   MCH 30.0 26.0 - 34.0 pg   MCHC 33.2 32.0 - 36.0 g/dL   RDW 16.2 (H) 11.5 - 14.5 %   Platelets 84 (L) 150 - 440 K/uL    Assessment/Plan: POD# 3 s/p robotic partial  nephrectomy.  1) acute blood loss anemia-appropriate bump in hematocrit following 1 unit packed red blood cell transfusion. Hgb stable x 25 hours.  No concern for ongoing bleeding.    2) leukocytosis- elevated without fevers/ chills.  Slowly trending down.  Will defer ID work up at this time, continue to trend unless spikes fever  3) abdominal distention/ nausea- suspect mild ileus. Continue clears and advance as tolerated.   SLIV given adequate PO intake  4) d/c foley  5) dispo-  Rehab, not yet appropriate for discharge, anticipate tomorrow vs. Weekend.    Hollice Espy, MD   LOS: 3 days   Hollice Espy 10/26/2015, 8:18 AM

## 2015-10-27 ENCOUNTER — Inpatient Hospital Stay: Payer: PPO

## 2015-10-27 LAB — BASIC METABOLIC PANEL
ANION GAP: 7 (ref 5–15)
BUN: 32 mg/dL — AB (ref 6–20)
CHLORIDE: 99 mmol/L — AB (ref 101–111)
CO2: 27 mmol/L (ref 22–32)
Calcium: 8.3 mg/dL — ABNORMAL LOW (ref 8.9–10.3)
Creatinine, Ser: 1.08 mg/dL — ABNORMAL HIGH (ref 0.44–1.00)
GFR calc Af Amer: 57 mL/min — ABNORMAL LOW (ref >60)
GFR calc non Af Amer: 49 mL/min — ABNORMAL LOW (ref >60)
GLUCOSE: 151 mg/dL — AB (ref 65–99)
POTASSIUM: 4.4 mmol/L (ref 3.5–5.1)
SODIUM: 133 mmol/L — AB (ref 135–145)

## 2015-10-27 LAB — CBC
HCT: 26.1 % — ABNORMAL LOW (ref 35.0–47.0)
HEMOGLOBIN: 8.6 g/dL — AB (ref 12.0–16.0)
MCH: 29.2 pg (ref 26.0–34.0)
MCHC: 33.1 g/dL (ref 32.0–36.0)
MCV: 88.2 fL (ref 80.0–100.0)
Platelets: 113 10*3/uL — ABNORMAL LOW (ref 150–440)
RBC: 2.95 MIL/uL — AB (ref 3.80–5.20)
RDW: 16.3 % — ABNORMAL HIGH (ref 11.5–14.5)
WBC: 28 10*3/uL — AB (ref 3.6–11.0)

## 2015-10-27 LAB — MAGNESIUM: Magnesium: 2.1 mg/dL (ref 1.7–2.4)

## 2015-10-27 LAB — HEMOGLOBIN AND HEMATOCRIT, BLOOD
HCT: 25.7 % — ABNORMAL LOW (ref 35.0–47.0)
Hemoglobin: 8.4 g/dL — ABNORMAL LOW (ref 12.0–16.0)

## 2015-10-27 LAB — GLUCOSE, CAPILLARY: Glucose-Capillary: 117 mg/dL — ABNORMAL HIGH (ref 65–99)

## 2015-10-27 MED ORDER — SODIUM CHLORIDE 0.9 % IV BOLUS (SEPSIS)
500.0000 mL | Freq: Once | INTRAVENOUS | Status: AC
  Administered 2015-10-27: 500 mL via INTRAVENOUS

## 2015-10-27 MED ORDER — KCL IN DEXTROSE-NACL 20-5-0.45 MEQ/L-%-% IV SOLN: INTRAVENOUS | Status: DC

## 2015-10-27 MED ORDER — ACETAMINOPHEN 10 MG/ML IV SOLN
1000.0000 mg | Freq: Four times a day (QID) | INTRAVENOUS | Status: AC
  Administered 2015-10-27 – 2015-10-28 (×4): 1000 mg via INTRAVENOUS
  Filled 2015-10-27 (×6): qty 100

## 2015-10-27 MED ORDER — HEPARIN SODIUM (PORCINE) 5000 UNIT/ML IJ SOLN
5000.0000 [IU] | Freq: Three times a day (TID) | INTRAMUSCULAR | Status: DC
  Administered 2015-10-27 – 2015-11-06 (×30): 5000 [IU] via SUBCUTANEOUS
  Filled 2015-10-27 (×32): qty 1

## 2015-10-27 MED ORDER — KCL IN DEXTROSE-NACL 20-5-0.9 MEQ/L-%-% IV SOLN
INTRAVENOUS | Status: DC
Start: 2015-10-27 — End: 2015-10-28
  Administered 2015-10-27: 13:00:00 via INTRAVENOUS
  Filled 2015-10-27 (×2): qty 1000

## 2015-10-27 MED ORDER — PANTOPRAZOLE SODIUM 40 MG IV SOLR
40.0000 mg | INTRAVENOUS | Status: DC
  Administered 2015-10-27 – 2015-10-30 (×4): 40 mg via INTRAVENOUS
  Filled 2015-10-27 (×4): qty 40

## 2015-10-27 MED ORDER — CEFAZOLIN SODIUM 1-5 GM-% IV SOLN
1.0000 g | Freq: Three times a day (TID) | INTRAVENOUS | Status: DC
  Administered 2015-10-27 – 2015-10-30 (×9): 1 g via INTRAVENOUS
  Filled 2015-10-27 (×11): qty 50

## 2015-10-27 MED ORDER — BISACODYL 10 MG RE SUPP
10.0000 mg | Freq: Once | RECTAL | Status: AC
  Administered 2015-10-27: 10 mg via RECTAL

## 2015-10-27 MED ORDER — LEVOTHYROXINE SODIUM 100 MCG IV SOLR
50.0000 ug | Freq: Every day | INTRAVENOUS | Status: DC
  Administered 2015-10-27 – 2015-10-31 (×4): 50 ug via INTRAVENOUS
  Filled 2015-10-27 (×7): qty 5

## 2015-10-27 NOTE — Progress Notes (Signed)
PT Cancellation Note  Patient Details Name: Caroline Nichols MRN: [id number] DOB: Oct 04, 1940   Cancelled Treatment:    Reason Eval/Treat Not Completed: Other (comment) Treatment attempted however, per RN pt. decreased BP and increased pain. Not appropriate for therapy at this time. Will re-attempt time permitting.    Sherral Hammers 10/27/2015, 2:16 PM M. Barnett Abu, SPT

## 2015-10-27 NOTE — Care Management Important Message (Signed)
Important Message  Patient Details  Name: Caroline Nichols MRN: [id number] Date of Birth: 1941-03-16   Medicare Important Message Given:  Yes    Juliann Pulse A Jennavieve Arrick 10/27/2015, 1:02 PM

## 2015-10-27 NOTE — Clinical Social Work Note (Signed)
Patient feeling worse today and now has an ng tube and has a bowel obstruction. CSW met with patient this afternoon to give support. Patient prefers to know her bed offers on Monday as she is not feeling well and CSW gave her the opportunity to decline.  Shela Leff MSW,LCSW [phone]

## 2015-10-27 NOTE — Progress Notes (Signed)
4 Days Post-Op Subjective: Remains afebrile.  No flatus.  Worsening abdominal distention today with emesis 2 yesterday. Very uncomfortable and feels bloated. No severe focal pain. Currently nothing by mouth on IV fluids.  KUB yesterday consistent with ileus. Air from previous pneumoperitoneum.   Unable to void again yesterday, catheter replaced.  Small liquidy BM after Dulcolax suppository.    Objective: Vital signs in last 24 hours: Temp:  [97.5 F (36.4 C)-98.6 F (37 C)] 97.6 F (36.4 C) (03/24 0908) Pulse Rate:  [96-111] 111 (03/24 0908) Resp:  [17-20] 20 (03/24 0908) BP: (104-149)/(67-89) 126/89 mmHg (03/24 0908) SpO2:  [93 %-100 %] 100 % (03/24 0908)  Intake/Output from previous day: 03/23 0701 - 03/24 0700 In: 1500 [P.O.:760; I.V.:740] Out: 1020 [Urine:1000; Drains:20] Intake/Output this shift:    Physical Exam:  General: Alert and oriented. CV: RRR.   Lungs: Clear bilaterally.  Wearing 02 (3 L) GI: Soft, significantly more distended today, mild diffuse tenderness.  No rebound or guarding.  No focal pain.   Incisions: Clean and dry. JP with scant serosanguineous fluid. Urine: Clear, Foley in place draining clear yellow urine.   Extremities: Nontender, no erythema, no edema.  Lab Results:  Recent Labs  10/25/15 1606 10/26/15 0359 10/27/15 0400  HGB 8.1* 8.6* 8.6*  HCT 25.1* 25.9* 26.1*           Recent Labs  10/24/15 0617 10/25/15 0513 10/27/15 0400  CREATININE 1.37* 1.18* 1.08*           Results for orders placed or performed during the hospital encounter of 10/23/15 (from the past 24 hour(s))  CBC     Status: Abnormal   Collection Time: 10/27/15  4:00 AM  Result Value Ref Range   WBC 28.0 (H) 3.6 - 11.0 K/uL   RBC 2.95 (L) 3.80 - 5.20 MIL/uL   Hemoglobin 8.6 (L) 12.0 - 16.0 g/dL   HCT 26.1 (L) 35.0 - 47.0 %   MCV 88.2 80.0 - 100.0 fL   MCH 29.2 26.0 - 34.0 pg   MCHC 33.1 32.0 - 36.0 g/dL   RDW 16.3 (H) 11.5 - 14.5 %   Platelets 113 (L)  150 - 440 K/uL  Basic metabolic panel     Status: Abnormal   Collection Time: 10/27/15  4:00 AM  Result Value Ref Range   Sodium 133 (L) 135 - 145 mmol/L   Potassium 4.4 3.5 - 5.1 mmol/L   Chloride 99 (L) 101 - 111 mmol/L   CO2 27 22 - 32 mmol/L   Glucose, Bld 151 (H) 65 - 99 mg/dL   BUN 32 (H) 6 - 20 mg/dL   Creatinine, Ser 1.08 (H) 0.44 - 1.00 mg/dL   Calcium 8.3 (L) 8.9 - 10.3 mg/dL   GFR calc non Af Amer 49 (L) >60 mL/min   GFR calc Af Amer 57 (L) >60 mL/min   Anion gap 7 5 - 15  Magnesium     Status: None   Collection Time: 10/27/15  4:00 AM  Result Value Ref Range   Magnesium 2.1 1.7 - 2.4 mg/dL    Assessment/Plan: POD# 4 s/p robotic partial nephrectomy.  1) acute blood loss anemia-appropriate bump in hematocrit following 1 unit packed red blood cell transfusion. Hgb stable x 48 hours.  No concern for ongoing bleeding.   Will start ppx SQH today in addition to VD   2) leukocytosis- elevated without fevers/ chills.  Stable.  Will defer ID work up at this time, continue to trend  unless spikes fever.  Etiology unclear, persistent since immediate post op.  Will continue to follow.  3) abdominal distention/ nausea- suspect worsening ileus. NPO with plan for NGT today for comfort.   Continue IVF.  4) urinary retention x 2- maintain foley  5) dispo-  Rehab, not yet appropriate for discharge  Hollice Espy, MD   LOS: 4 days   Hollice Espy 10/27/2015, 9:14 AM

## 2015-10-28 ENCOUNTER — Inpatient Hospital Stay: Payer: PPO

## 2015-10-28 DIAGNOSIS — Z8679 Personal history of other diseases of the circulatory system: Secondary | ICD-10-CM

## 2015-10-28 LAB — BASIC METABOLIC PANEL
ANION GAP: 7 (ref 5–15)
BUN: 39 mg/dL — AB (ref 6–20)
CALCIUM: 7.5 mg/dL — AB (ref 8.9–10.3)
CHLORIDE: 104 mmol/L (ref 101–111)
CO2: 26 mmol/L (ref 22–32)
Creatinine, Ser: 1.32 mg/dL — ABNORMAL HIGH (ref 0.44–1.00)
GFR calc Af Amer: 45 mL/min — ABNORMAL LOW (ref >60)
GFR, EST NON AFRICAN AMERICAN: 39 mL/min — AB (ref >60)
GLUCOSE: 161 mg/dL — AB (ref 65–99)
POTASSIUM: 4.2 mmol/L (ref 3.5–5.1)
Sodium: 137 mmol/L (ref 135–145)

## 2015-10-28 LAB — CBC
HEMATOCRIT: 22.5 % — AB (ref 35.0–47.0)
HEMOGLOBIN: 7.3 g/dL — AB (ref 12.0–16.0)
MCH: 29 pg (ref 26.0–34.0)
MCHC: 32.7 g/dL (ref 32.0–36.0)
MCV: 88.7 fL (ref 80.0–100.0)
Platelets: 155 10*3/uL (ref 150–440)
RBC: 2.53 MIL/uL — AB (ref 3.80–5.20)
RDW: 16.4 % — ABNORMAL HIGH (ref 11.5–14.5)
WBC: 26.9 10*3/uL — AB (ref 3.6–11.0)

## 2015-10-28 MED ORDER — IPRATROPIUM-ALBUTEROL 0.5-2.5 (3) MG/3ML IN SOLN: 3.0000 mL | Freq: Four times a day (QID) | RESPIRATORY_TRACT | Status: DC

## 2015-10-28 MED ORDER — MAGNESIUM SULFATE IN D5W 10-5 MG/ML-% IV SOLN
1.0000 g | Freq: Once | INTRAVENOUS | Status: AC
  Administered 2015-10-28: 1 g via INTRAVENOUS
  Filled 2015-10-28: qty 100

## 2015-10-28 MED ORDER — SODIUM CHLORIDE 0.9 % IV SOLN
1.0000 g | Freq: Once | INTRAVENOUS | Status: AC
  Administered 2015-10-28: 1 g via INTRAVENOUS
  Filled 2015-10-28: qty 10

## 2015-10-28 MED ORDER — DILTIAZEM HCL 25 MG/5ML IV SOLN
10.0000 mg | Freq: Once | INTRAVENOUS | Status: AC
  Administered 2015-10-28: 10 mg via INTRAVENOUS
  Filled 2015-10-28: qty 5

## 2015-10-28 MED ORDER — SODIUM CHLORIDE 0.9 % IV BOLUS (SEPSIS)
500.0000 mL | Freq: Once | INTRAVENOUS | Status: AC
  Administered 2015-10-28: 500 mL via INTRAVENOUS

## 2015-10-28 MED ORDER — DEXTROSE-NACL 5-0.9 % IV SOLN
INTRAVENOUS | Status: AC
  Administered 2015-10-28: 01:00:00 via INTRAVENOUS

## 2015-10-28 MED ORDER — METOPROLOL TARTRATE 25 MG PO TABS
12.5000 mg | ORAL_TABLET | Freq: Two times a day (BID) | ORAL | Status: DC
  Administered 2015-10-28 – 2015-11-04 (×14): 12.5 mg via ORAL
  Filled 2015-10-28 (×14): qty 1

## 2015-10-28 MED ORDER — DILTIAZEM HCL 100 MG IV SOLR
5.0000 mg/h | INTRAVENOUS | Status: DC
  Administered 2015-10-28: 5 mg/h via INTRAVENOUS
  Filled 2015-10-28: qty 100

## 2015-10-28 MED ORDER — FUROSEMIDE 10 MG/ML IJ SOLN
40.0000 mg | Freq: Once | INTRAMUSCULAR | Status: AC
  Administered 2015-10-28: 40 mg via INTRAVENOUS
  Filled 2015-10-28: qty 4

## 2015-10-28 MED ORDER — METHYLPREDNISOLONE SODIUM SUCC 125 MG IJ SOLR
60.0000 mg | INTRAMUSCULAR | Status: DC
  Administered 2015-10-28 – 2015-10-30 (×3): 60 mg via INTRAVENOUS
  Filled 2015-10-28 (×3): qty 2

## 2015-10-28 NOTE — Progress Notes (Signed)
Report called to [employer], RN at 2A.  Transferred patient to room  236. Went with patient to speak in person with nurse. Telephoned both daughters - Gordy Savers and Champ Mungo- no answer. Will continue to try to contact to let family know of the transfer.

## 2015-10-28 NOTE — Progress Notes (Signed)
Notified Dr. Marcille Blanco of telemetry change to junctional. He states he will come to see patient.

## 2015-10-28 NOTE — Progress Notes (Signed)
RR called. Pt noted to be SOB on NRB sats in 90's when good wave form noted.. Pt has a fib with RVR HR 140's sustained.. physician notified . Cardizem  10 mg pushed x2. With no sustained results. EKG obtained verifying Afib with RVR . 565m NS bolus given . Orders written to transfer pt. To 2a -

## 2015-10-28 NOTE — Consult Note (Signed)
[facility] [place] NAME: Caroline Nichols    MR#:  [id number]  DATE OF BIRTH:  06-19-1941  DATE OF ADMISSION:  10/23/2015  PRIMARY CARE PHYSICIAN: [name], MD   REQUESTING/REFERRING PHYSICIAN: Erlene Quan, MD  CHIEF COMPLAINT:  No chief complaint on file.   HISTORY OF PRESENT ILLNESS:  Caroline Nichols  is a 75 y.o. female who presents with right renal mass and now s/p partial right nephrectomy.  Developed afib with RVR tonight.  Rapid response and medicine consult called.  Patient states that she has had an episode of this kind of fast heart rate when she was hospitalized one time for. However, she does not seem to be on any rate controlling medications at home.  PAST MEDICAL HISTORY:   Past Medical History  Diagnosis Date  . Hypertension   . Hyperthyroidism     s/p radiation therapy, now hypothyroidism  . IBS (irritable bowel syndrome)   . Herniated disc   . Pancreatic cyst   . Arthritis     Possible RA - Follow up appt with Dr. Jefm Bryant  . H/O transfusion of packed red blood cells 02/2013    3 units PRBC for severe anemia 02/2013  . Anemia 02/2013    F/U with Dr. Grayland Ormond for Feraheme injections  . Hyperlipidemia   . Allergy   . History of kidney stones   . Migraine   . Cellulitis of arm, right   . Paroxysmal a-fib ([place])   . Diabetes insipidus ([place])     On desmopressin  . H/O seasonal allergies   . Anxiety   . GERD (gastroesophageal reflux disease)   . Chronic kidney disease     PAST SURGICAL HISTOIRY:   Past Surgical History  Procedure Laterality Date  . Tubal ligation    . Cervical spine surgery      Bone fusion  . Hammer toe surgery    . Abdominal hysterectomy    . Breast surgery  1990s    Biopsy  . Breast excisional biopsy Right 2005    neg  . Hernia repair Left     Inguinal Hernia Repair  . Robotic assited partial nephrectomy Right 10/23/2015    Procedure: ROBOTIC ASSITED PARTIAL NEPHRECTOMY with  intraop ultrasound;  Surgeon: Hollice Espy, MD;  Location: [facility] ORS;  Service: Urology;  Laterality: Right;    SOCIAL HISTORY:   Social History  Substance Use Topics  . Smoking status: Former Smoker -- 0.50 packs/day for 30 years    Types: Cigarettes    Quit date: 12/03/2012  . Smokeless tobacco: Never Used  . Alcohol Use: No    FAMILY HISTORY:   Family History  Problem Relation Age of Onset  . Heart disease Mother   . Heart disease Father   . Diabetes Brother   . Kidney disease Brother   . Stroke Daughter     due to medication reaction  . Prostate cancer Neg Hx   . Hematuria Neg Hx     DRUG ALLERGIES:   Allergies  Allergen Reactions  . Adhesive [Tape] Other (See Comments)    "irritate skin"  . Codeine     REACTION: NAUSEA  . Tetanus Toxoid     Other reaction(s): Unknown REACTION: ARM SWELLING,REDNESS  . Tetanus Toxoids   . Tramadol Nausea And Vomiting    REVIEW OF SYSTEMS:  Review of Systems  Constitutional: Negative for fever, chills, weight loss and malaise/fatigue.  HENT: Negative for ear  pain, hearing loss and tinnitus.   Eyes: Negative for blurred vision, double vision, pain and redness.  Respiratory: Positive for shortness of breath. Negative for cough and hemoptysis.   Cardiovascular: Positive for palpitations. Negative for chest pain, orthopnea and leg swelling.  Gastrointestinal: Negative for nausea, vomiting, abdominal pain, diarrhea and constipation.  Genitourinary: Negative for dysuria, frequency and hematuria.  Musculoskeletal: Negative for back pain, joint pain and neck pain.  Skin:       No acne, rash, or lesions  Neurological: Negative for dizziness, tremors, focal weakness and weakness.  Endo/Heme/Allergies: Negative for polydipsia. Does not bruise/bleed easily.  Psychiatric/Behavioral: Negative for depression. The patient is not nervous/anxious and does not have insomnia.     MEDICATIONS AT HOME:   Prior to Admission medications    Medication Sig Start Date End Date Taking? Authorizing Provider  ALPRAZolam Duanne Moron) 0.5 MG tablet Take 1 tablet (0.5 mg total) by mouth 2 (two) times daily as needed for anxiety. 05/18/15  Yes [name], MD  brimonidine (ALPHAGAN) 0.2 % ophthalmic solution Place 1 drop into both eyes 2 (two) times daily. 10/31/14  Yes Historical Provider, MD  Calcium-Vitamin D (SUPER CALCIUM/D) 600-125 MG-UNIT TABS Take by mouth. Taking 2 daily   Yes Historical Provider, MD  Cholecalciferol (D3-1000 PO) Take 1,000 Units by mouth daily. Reported on 07/28/2015   Yes Historical Provider, MD  ferrous sulfate 325 (65 FE) MG tablet Take 325 mg by mouth daily with breakfast.   Yes Historical Provider, MD  fluticasone (FLONASE) 50 MCG/ACT nasal spray Place 2 sprays into both nostrils daily. 11/14/14  Yes [name], MD  gabapentin (NEURONTIN) 300 MG capsule TAKE 1 CAPSULE (300 MG TOTAL) BY MOUTH 3 (THREE) TIMES DAILY. 05/22/15  Yes [name], MD  hydrALAZINE (APRESOLINE) 10 MG tablet Take 1 tablet (10 mg total) by mouth 2 (two) times daily. 10/18/14  Yes Rubbie Battiest, NP  hydroxychloroquine (PLAQUENIL) 200 MG tablet Take 2 tablets (400 mg total) by mouth daily. 11/11/14  Yes Rubbie Battiest, NP  ipratropium (ATROVENT) 0.03 % nasal spray Place 1 spray into both nostrils 2 (two) times daily. Reported on 07/28/2015 05/30/15  Yes Historical Provider, MD  levothyroxine (SYNTHROID, LEVOTHROID) 112 MCG tablet TAKE 1 TABLET (112 MCG TOTAL) BY MOUTH DAILY BEFORE BREAKFAST. 08/10/15  Yes [name], MD  losartan (COZAAR) 50 MG tablet Take 1 tablet (50 mg total) by mouth daily. 05/30/15  Yes [name], MD  mirtazapine (REMERON) 7.5 MG tablet TAKE 1 TABLET (7.5 MG TOTAL) BY MOUTH AT BEDTIME. 05/16/15  Yes [name], MD  Omega-3 Fatty Acids (FISH OIL) 1000 MG CAPS Take 1,000 mg by mouth daily.    Yes Historical Provider, MD  pantoprazole (PROTONIX) 40 MG tablet TAKE 1 TABLET (40 MG TOTAL) BY MOUTH DAILY. 02/20/15  Yes  Rubbie Battiest, NP  vitamin C (ASCORBIC ACID) 500 MG tablet Take 500 mg by mouth daily.   Yes Historical Provider, MD  acetaminophen (TYLENOL) 325 MG tablet Take 2 tablets (650 mg total) by mouth every 6 (six) hours as needed for pain. 01/04/13   Modena Jansky, MD  hydrALAZINE (APRESOLINE) 10 MG tablet TAKE 1 TABLET (10 MG TOTAL) BY MOUTH 2 (TWO) TIMES DAILY. 09/20/15   [name], MD  mirabegron ER (MYRBETRIQ) 50 MG TB24 tablet Take 1 tablet (50 mg total) by mouth daily. 06/20/15   Ardis Hughs, MD  nitroGLYCERIN (NITROLINGUAL) 0.4 MG/SPRAY spray Place 1 spray under  the tongue every 5 (five) minutes x 3 doses as needed for chest pain. 04/14/15   [name], MD      VITAL SIGNS:   Filed Vitals:   10/27/15 2354 10/28/15 0001 10/28/15 0028 10/28/15 0038  BP: 96/67 126/71 96/73 110/62  Pulse: 146 133 61 89  Temp:      TempSrc:      Resp:      Height:      Weight:      SpO2: 78% 96% 100% 100%   Wt Readings from Last 3 Encounters:  10/23/15 67.767 kg (149 lb 6.4 oz)  10/12/15 61.689 kg (136 lb)  10/10/15 62.483 kg (137 lb 12 oz)    PHYSICAL EXAMINATION:  Physical Exam  Vitals reviewed. Constitutional: She is oriented to person, place, and time. She appears well-developed and well-nourished. No distress.  HENT:  Head: Normocephalic and atraumatic.  Mouth/Throat: Oropharynx is clear and moist.  Eyes: Conjunctivae and EOM are normal. Pupils are equal, round, and reactive to light. No scleral icterus.  Neck: Normal range of motion. Neck supple. No JVD present. No thyromegaly present.  Cardiovascular: Intact distal pulses.  Exam reveals no gallop and no friction rub.   No murmur heard. Tachycardic, irregular rhythm  Respiratory: Effort normal and breath sounds normal. No respiratory distress. She has no wheezes. She has no rales.  GI: Soft. Bowel sounds are normal. She exhibits no distension. There is no tenderness.  Musculoskeletal: Normal range of motion. She exhibits no  edema.  No arthritis, no gout  Lymphadenopathy:    She has no cervical adenopathy.  Neurological: She is alert and oriented to person, place, and time. No cranial nerve deficit.  No dysarthria, no aphasia  Skin: Skin is warm and dry. No rash noted. No erythema.  Psychiatric: She has a normal mood and affect. Her behavior is normal. Judgment and thought content normal.     LABORATORY PANEL:   CBC  Recent Labs Lab 10/27/15 0400 10/27/15 1512  WBC 28.0*  --   HGB 8.6* 8.4*  HCT 26.1* 25.7*  PLT 113*  --    ------------------------------------------------------------------------------------------------------------------  Chemistries   Recent Labs Lab 10/27/15 0400  NA 133*  K 4.4  CL 99*  CO2 27  GLUCOSE 151*  BUN 32*  CREATININE 1.08*  CALCIUM 8.3*  MG 2.1   ------------------------------------------------------------------------------------------------------------------  Cardiac Enzymes No results for input(s): TROPONINI in the last 168 hours. ------------------------------------------------------------------------------------------------------------------  RADIOLOGY:  Dg Abd 1 View  10/27/2015  CLINICAL DATA:  Abdominal pain EXAM: ABDOMEN - 1 VIEW COMPARISON:  Yesterday FINDINGS: Distended small bowel loops remain scattered across the abdomen. NG tube is present in the body of the stomach. Surgical drain in the right upper quadrant is stable. Extraluminal extraperitoneal gas along the right-sided abdomen is diminishing. IMPRESSION: NG tube placed.  Stable dilated small bowel loops. Electronically Signed   By: Marybelle Killings M.D.   On: 10/27/2015 10:48   Dg Abd 1 View  10/26/2015  CLINICAL DATA:  Epigastric abdominal pain. Recent surgery for a partial nephrectomy. EXAM: ABDOMEN - 1 VIEW COMPARISON:  MRI abdomen 09/27/2015 FINDINGS: Air throughout the small bowel and colon suggesting a postoperative ileus. Probable some free air related to the recent surgery. There is a  drain noted in the right upper quadrant. IMPRESSION: Suspect postop ileus. Some free air could be related to surgery. An upright film with the diaphragms included or a left-side-down right-side-up decubitus film may be helpful. Electronically Signed   By:  Marijo Sanes M.D.   On: 10/26/2015 17:56    EKG:   Orders placed or performed during the hospital encounter of 10/23/15  . EKG 12-Lead  . EKG 12-Lead    IMPRESSION AND PLAN:  Active Problems:   Right renal mass - Status post partial right nephrectomy. Defer to urology for primary management of this problem.   Atrial fibrillation with RVR ([place]) - developed tonight. Patient's heart rate went up into the 140s and 150s. Rapid response was called and internal medicine consult was called. Patient responded initially briefly to IV Cardizem push. Blood pressure was on the low end of normal, but responded well to IV fluids. Patient was transferred to telemetry floor with initiation of Cardizem drip. Recommend transition to by mouth rate controlling medications when her heart rate is controlled, per review of home meds it does not seem like she is on anything for this, the patient does state that she has had this before.  We will continue to follow along for now.  CODE STATUS:  Code Status History    Date Active Date Inactive Code Status Order ID Comments User Context   12/27/2012 12:49 AM 01/04/2013  9:54 PM Full Code [id number]  Bonnielee Haff, MD Inpatient      TOTAL TIME TAKING CARE OF THIS PATIENT: 40 minutes.    Trigger Frasier [place] 10/28/2015, 1:12 AM  [facility]  Office  [phone]  CC: Primary care Physician: [name], MD

## 2015-10-28 NOTE — Progress Notes (Signed)
Responded to Rapid Response. Pt placed on NRB due to complaint of SOB. Difficult to read an O2 sat. Once able to pick O2 sat, reading was 94% on NRB mask. EKG performed and results given to  Dr. Jannifer Franklin.

## 2015-10-28 NOTE — Progress Notes (Signed)
Patient transferred from [facility] due to tachycardia. Heart rate reportedly up to 200. Now it is staying at 100. BP in the 88Q to 773P systolically. Cardizem drip initiated. Patient skin dusky and cold. A&O with no complaints. Spoke to daughter on phone. Resp even and unlabored.

## 2015-10-28 NOTE — Progress Notes (Signed)
Called to check on pt. Due to low O2 sat. Pt on 6L with O2 sat of 98%. Pt complaining of being hot & having a little difficulty breathing. Place pt on NRB. Pt states she feels better

## 2015-10-28 NOTE — Progress Notes (Signed)
Notified Dr. Marcille Blanco of H&H of 7.3 and 22.5

## 2015-10-28 NOTE — Progress Notes (Signed)
5 Days Post-Op Subjective: 2 liquidy bowel movements yesterday and one small liquid bowel movement today.  No flatus. Significantly improved abdominal distention with NG tube. Large NG tube output yesterday and minimal this morning. One episode of hypotension with oliguria yesterday afternoon which responded well to bolus.  Overnight, the patient developed tachycardia, A. fib with RVR. She remained normotensive. She did not respond to IV Cardizem and therefore was transferred and started on IV Cardizem drip. Heart rate now controlled. She also desaturated to 85% complaining of shortness of breath which improved dramatically with nonrebreather. Chest x-ray shows vascular congestion and she was given diuretic and IV Solu-Medrol per the medicine team for presumed COPD/ CHF exacerbation.  This morning, abdomen is much less distended and feels more comfortable. Respiratory status also improving.  Not ambulatory.  Feeling depressed.    Objective: Vital signs in last 24 hours: Temp:  [97.9 F (36.6 C)-98.6 F (37 C)] 98.3 F (36.8 C) (03/25 0710) Pulse Rate:  [61-146] 73 (03/25 1202) Resp:  [18-20] 20 (03/25 0710) BP: (80-142)/(29-86) 142/61 mmHg (03/25 1202) SpO2:  [78 %-100 %] 100 % (03/25 1110) Weight:  [150 lb 6.4 oz (68.221 kg)] 150 lb 6.4 oz (68.221 kg) (03/25 0143)  Intake/Output from previous day: 03/24 0701 - 03/25 0700 In: 1596 [I.V.:1274; NG/GT:30; IV Piggyback:292] Out: 3075 [Urine:665; Emesis/NG output:2400; Drains:10] Intake/Output this shift: Total I/O In: -  Out: 68 [Urine:400; Drains:10]  Physical Exam:  General: Alert and oriented. CV: No LE edema.   Lungs: Mild tachypnea.  Wearing non-rebreather.   GI: Soft, improved minimal distention, nontender .  No rebound or guarding.  No focal pain.   Incisions: Clean and dry. JP with scant serosanguineous fluid. Urine: Clear, Foley in place draining clear yellow urine.   Extremities: Nontender, no erythema, no edema.  Lab  Results:  Recent Labs  10/27/15 0400 10/27/15 1512 10/28/15 0432  HGB 8.6* 8.4* 7.3*  HCT 26.1* 25.7* 22.5*           Recent Labs  10/25/15 0513 10/27/15 0400 10/28/15 0432  CREATININE 1.18* 1.08* 1.32*           Results for orders placed or performed during the hospital encounter of 10/23/15 (from the past 24 hour(s))  Hemoglobin and hematocrit, blood     Status: Abnormal   Collection Time: 10/27/15  3:12 PM  Result Value Ref Range   Hemoglobin 8.4 (L) 12.0 - 16.0 g/dL   HCT 25.7 (L) 35.0 - 47.0 %  Glucose, capillary     Status: Abnormal   Collection Time: 10/27/15 11:56 PM  Result Value Ref Range   Glucose-Capillary 117 (H) 65 - 99 mg/dL  CBC     Status: Abnormal   Collection Time: 10/28/15  4:32 AM  Result Value Ref Range   WBC 26.9 (H) 3.6 - 11.0 K/uL   RBC 2.53 (L) 3.80 - 5.20 MIL/uL   Hemoglobin 7.3 (L) 12.0 - 16.0 g/dL   HCT 22.5 (L) 35.0 - 47.0 %   MCV 88.7 80.0 - 100.0 fL   MCH 29.0 26.0 - 34.0 pg   MCHC 32.7 32.0 - 36.0 g/dL   RDW 16.4 (H) 11.5 - 14.5 %   Platelets 155 150 - 440 K/uL  Basic metabolic panel     Status: Abnormal   Collection Time: 10/28/15  4:32 AM  Result Value Ref Range   Sodium 137 135 - 145 mmol/L   Potassium 4.2 3.5 - 5.1 mmol/L   Chloride 104 101 -  111 mmol/L   CO2 26 22 - 32 mmol/L   Glucose, Bld 161 (H) 65 - 99 mg/dL   BUN 39 (H) 6 - 20 mg/dL   Creatinine, Ser 1.32 (H) 0.44 - 1.00 mg/dL   Calcium 7.5 (L) 8.9 - 10.3 mg/dL   GFR calc non Af Amer 39 (L) >60 mL/min   GFR calc Af Amer 45 (L) >60 mL/min   Anion gap 7 5 - 15    Assessment/Plan: POD# 5 s/p robotic partial nephrectomy.  1) acute blood loss anemia-appropriate bump in hematocrit following 1 unit packed red blood cell transfusion. Slight drop this AM after fluid bolus.  Minimal concern for ongoing bleeding.   On SQH/ VD for ppx.  2) leukocytosis- elevated without fevers/ chills.  Stable.  Will defer ID work up at this time, continue to trend unless spikes fever.   Etiology unclear, persistent since immediate post op.  Will continue to follow.  Anticipate rise with IV solumedrol.    3) abdominal distention/ nausea- Improving distention with NGT.  High output yesterday, 2400 cc but significant less since MN.  Clamped this AM, will check residuals prior to removal.  Keep NPO.  4) urinary retention x 2- maintain foley  5) CHF/ COPD exacerbation- per medicine, IV lasix, duo nebs, and hold IV hydration  6) Afib with RVR- responded to IV dilt gtt. Now in sinus rhythm with HR in 70s.  Medicine following/ managing.    7) dispo-  Rehab, not yet appropriate for discharge  Hollice Espy, MD   LOS: 5 days   Hollice Espy 10/28/2015, 12:30 PM

## 2015-10-28 NOTE — Progress Notes (Signed)
[facility] [place] NAME: Caroline Nichols    MR#:  [id number]  DATE OF BIRTH:  December 01, 1940  SUBJECTIVE: Admitted to   Urology  service for partial nephrectomy for renal mass. Medicine was consulted last night because of atrial fibrillation with RVR. Patient was given IV Cardizem, started on Cardizem drip. And she also received IV hydration for hypotension. But right now patient is in sinus rhythm with heart rate in 80s. She is having respiratory distress because of the fluid and pulmonary edema. And she is on 100% nonrebreather. Blood pressure is stable at 101/56. In dropped to 82-85% the early this morning so she was given 100% nonrebreather and now she is saturating 100%. But patient complains of shortness of breath unable to breathe.   CHIEF COMPLAINT:  No chief complaint on file.   REVIEW OF SYSTEMS:    Review of Systems  Constitutional: Negative for fever and chills.  HENT: Negative for hearing loss.   Eyes: Negative for blurred vision, double vision and photophobia.  Respiratory: Positive for shortness of breath and wheezing. Negative for cough and hemoptysis.   Cardiovascular: Negative for palpitations, orthopnea and leg swelling.  Gastrointestinal: Negative for vomiting, abdominal pain and diarrhea.  Genitourinary: Negative for dysuria and urgency.  Musculoskeletal: Negative for myalgias and neck pain.  Skin: Negative for rash.  Neurological: Negative for dizziness, focal weakness, seizures, weakness and headaches.  Psychiatric/Behavioral: Negative for memory loss. The patient does not have insomnia.     Nutrition:  Tolerating Diet: Tolerating PT:      DRUG ALLERGIES:   Allergies  Allergen Reactions  . Adhesive [Tape] Other (See Comments)    "irritate skin"  . Codeine     REACTION: NAUSEA  . Tetanus Toxoid     Other reaction(s): Unknown REACTION: ARM SWELLING,REDNESS  . Tetanus Toxoids   . Tramadol Nausea And Vomiting     VITALS:  Blood pressure 101/56, pulse 82, temperature 98.3 F (36.8 C), temperature source Oral, resp. rate 20, height '5\' 2"'$  (1.575 m), weight 68.221 kg (150 lb 6.4 oz), SpO2 100 %.  PHYSICAL EXAMINATION:   Physical Exam  GENERAL:  75 y.o.-year-old patient lying in the bed with no acute distress.  EYES: Pupils equal, round, reactive to light and accommodation. No scleral icterus. Extraocular muscles intact.  HEENT: Head atraumatic, normocephalic. Oropharynx and nasopharynx clear.  NECK:  Supple, no jugular venous distention. No thyroid enlargement, no tenderness.  LUNGS: Bilateral expiratory wheezing in all lung fields , bilateral basilar crepitations present.  CARDIOVASCULAR: S1, S2 normal. No murmurs, rubs, or gallops.  ABDOMEN: Soft, nontender, nondistended. Bowel sounds present. No organomegaly or mass.  EXTREMITIES: No pedal edema, cyanosis, or clubbing.  NEUROLOGIC: Cranial nerves II through XII are intact. Muscle strength 5/5 in all extremities. Sensation intact. Gait not checked.  PSYCHIATRIC: The patient is alert and oriented x 3.  SKIN: No obvious rash, lesion, or ulcer.    LABORATORY PANEL:   CBC  Recent Labs Lab 10/28/15 0432  WBC 26.9*  HGB 7.3*  HCT 22.5*  PLT 155   ------------------------------------------------------------------------------------------------------------------  Chemistries   Recent Labs Lab 10/27/15 0400 10/28/15 0432  NA 133* 137  K 4.4 4.2  CL 99* 104  CO2 27 26  GLUCOSE 151* 161*  BUN 32* 39*  CREATININE 1.08* 1.32*  CALCIUM 8.3* 7.5*  MG 2.1  --    ------------------------------------------------------------------------------------------------------------------  Cardiac Enzymes No results for input(s): TROPONINI in the last 168 hours. ------------------------------------------------------------------------------------------------------------------  RADIOLOGY:  Dg Abd 1 View  10/27/2015  CLINICAL DATA:  Abdominal  pain EXAM: ABDOMEN - 1 VIEW COMPARISON:  Yesterday FINDINGS: Distended small bowel loops remain scattered across the abdomen. NG tube is present in the body of the stomach. Surgical drain in the right upper quadrant is stable. Extraluminal extraperitoneal gas along the right-sided abdomen is diminishing. IMPRESSION: NG tube placed.  Stable dilated small bowel loops. Electronically Signed   By: Marybelle Killings M.D.   On: 10/27/2015 10:48   Dg Abd 1 View  10/26/2015  CLINICAL DATA:  Epigastric abdominal pain. Recent surgery for a partial nephrectomy. EXAM: ABDOMEN - 1 VIEW COMPARISON:  MRI abdomen 09/27/2015 FINDINGS: Air throughout the small bowel and colon suggesting a postoperative ileus. Probable some free air related to the recent surgery. There is a drain noted in the right upper quadrant. IMPRESSION: Suspect postop ileus. Some free air could be related to surgery. An upright film with the diaphragms included or a left-side-down right-side-up decubitus film may be helpful. Electronically Signed   By: Marijo Sanes M.D.   On: 10/26/2015 17:56   Dg Chest Port 1 View  10/28/2015  CLINICAL DATA:  Pt stats dropped down to 80s on 5L O2 this morning, pt reports right side nephrostomy tube placed 6 days ago, repeated image to confirm inspiration. EXAM: PORTABLE CHEST 1 VIEW COMPARISON:  None. FINDINGS: Low lung volumes. Normal cardiac silhouette. Mild central venous congestion. NG tube extends into the stomach. IMPRESSION: Low lung volumes and central venous congestion. Electronically Signed   By: Suzy Bouchard M.D.   On: 10/28/2015 08:28     ASSESSMENT AND PLAN:   Active Problems:   Renal mass   Right renal mass   Atrial fibrillation with RVR ([facility])   #1 acute hypoxic respiratory failure secondary to COPD exacerbation, CHF exacerbation. The new oxygen supplements, start IV Solu-Medrol, DuoNeb's, we'll give IV Lasix one time, discontinue IV hydration. #2/acute on chronic diastolic heart failure:  Hopefully 1 dose of Lasix to help. #3 renal mass, patient had a right partial nephrectomy. Urology is following. #4 hypertension: Controlled. #5 . Paroxysmal  atrial fibrillation: Patient has a history of proximal atrial fibrillation. Had a RVR last night. But she is they are now in sinus rhythm and heart rate is in 80s. Discontinue the Cardizem drip.  #6 h/o of tobacco abuse ;now quit the 3 years ago. #7. Mild acute on chronic anemia: Patient has history of acute blood loss anemia due to surgery: No concern for ongoing bleeding according to urology note. Patient received 1 unit of packed RBC. Likely secondary to hemodilution. Hold off on transfusion at this time because she is already in respiratory distress.  8.abdominal distention with nausea: Patient received NG tube, nothing by mouth. But repeat x-ray of the abdomen is showing improvement, patient denies abdominal pain. Discontinue NG tube.  All the records are reviewed and case discussed with Care Management/Social Workerr. Management plans discussed with the patient, family and they are in agreement.  CODE STATUS: full  TOTAL TIME TAKING CARE OF THIS PATIENT: 35 minutes.   POSSIBLE D/C IN 2-3 DAYS, DEPENDING ON CLINICAL CONDITION.   Epifanio Lesches M.D on 10/28/2015 at 11:27 AM  Between 7am to 6pm - Pager - [phone]  After 6pm go to [email or web address] - password [email or web address] [facility]  Office  [phone]  CC: Primary care physician; [name], MD

## 2015-10-28 NOTE — Progress Notes (Signed)
Dr. Erlene Quan paged to speak with family

## 2015-10-28 NOTE — Progress Notes (Signed)
Patient Caroline Nichols dropped down to 80s on 5L O2. Respiratory notified. NRB place. O2 Caroline Nichols now 100. Patient short of breath and says she feels bad.  Notified Dr. Marcille Blanco who came up to see patient.  H&H ordered stat. Will continue to monitor.

## 2015-10-29 ENCOUNTER — Inpatient Hospital Stay: Payer: PPO

## 2015-10-29 LAB — BASIC METABOLIC PANEL
ANION GAP: 10 (ref 5–15)
BUN: 39 mg/dL — ABNORMAL HIGH (ref 6–20)
CALCIUM: 8.6 mg/dL — AB (ref 8.9–10.3)
CO2: 31 mmol/L (ref 22–32)
Chloride: 97 mmol/L — ABNORMAL LOW (ref 101–111)
Creatinine, Ser: 0.84 mg/dL (ref 0.44–1.00)
Glucose, Bld: 115 mg/dL — ABNORMAL HIGH (ref 65–99)
POTASSIUM: 3.6 mmol/L (ref 3.5–5.1)
Sodium: 138 mmol/L (ref 135–145)

## 2015-10-29 LAB — CBC
HEMATOCRIT: 25.7 % — AB (ref 35.0–47.0)
Hemoglobin: 8.6 g/dL — ABNORMAL LOW (ref 12.0–16.0)
MCH: 29.6 pg (ref 26.0–34.0)
MCHC: 33.4 g/dL (ref 32.0–36.0)
MCV: 88.7 fL (ref 80.0–100.0)
PLATELETS: 255 10*3/uL (ref 150–440)
RBC: 2.9 MIL/uL — AB (ref 3.80–5.20)
RDW: 16.1 % — AB (ref 11.5–14.5)
WBC: 23.8 10*3/uL — AB (ref 3.6–11.0)

## 2015-10-29 LAB — MAGNESIUM: Magnesium: 2.2 mg/dL (ref 1.7–2.4)

## 2015-10-29 MED ORDER — BISACODYL 10 MG RE SUPP
10.0000 mg | Freq: Once | RECTAL | Status: AC
  Administered 2015-10-29: 10 mg via RECTAL

## 2015-10-29 MED ORDER — ACETAMINOPHEN 10 MG/ML IV SOLN
1000.0000 mg | Freq: Four times a day (QID) | INTRAVENOUS | Status: DC | PRN
  Filled 2015-10-29: qty 100

## 2015-10-29 NOTE — Progress Notes (Signed)
Pt. Did not sleep during the night, she received her bath and finally drifted off to sleep about 6 A.M.

## 2015-10-29 NOTE — Progress Notes (Signed)
Dr. Erlene Quan paged to see if pt okay to transfer to any med surg floor, Dr. Erlene Quan is okay with transferring, wants pt to go to surgical floor with telemetry. Bed request put it. Will continue to monitor. Conley Simmonds, RN

## 2015-10-29 NOTE — Progress Notes (Signed)
6 Days Post-Op Subjective: NG tube removed by patient overnight, unable to replace (primary MD NOT made aware).  Not complaining of abdominal distention or pain today. No nausea or vomiting. Passing flatus.    Remains in normal sinus rhythm, mildly tachycardic. Respiratory status improving, decreased oxygen requirement.  Labs normalizing other then leukocytosis which is stable. Remains afebrile.  Family concerned about patient confusion, not sleeping.   Objective: Vital signs in last 24 hours: Temp:  [97.5 F (36.4 C)-97.9 F (36.6 C)] 97.9 F (36.6 C) (03/26 1212) Pulse Rate:  [101-110] 103 (03/26 1212) Resp:  [17-20] 17 (03/26 1212) BP: (150-183)/(62-89) 181/62 mmHg (03/26 1212) SpO2:  [94 %-100 %] 94 % (03/26 1212)  Intake/Output from previous day: 03/25 0701 - 03/26 0700 In: 220 [NG/GT:120; IV Piggyback:100] Out: 2825 [Urine:2100; Emesis/NG output:700; Drains:25] Intake/Output this shift:    Physical Exam:  General: Alert and oriented but tangential and occasional inappropriate responses. CV: No LE edema.   Lungs: No increased work of breathing. Wearing nasal cannula. GI: Soft, minimal distention, nontender, stable from yesterday.  No rebound or guarding.  No focal pain.  Nontoxic. Incisions: Clean and dry. JP with scant serosanguineous fluid. Urine: Clear, Foley in place draining clear yellow urine.   Extremities: Nontender, no erythema, no edema.  Lab Results:  Recent Labs  10/27/15 1512 10/28/15 0432 10/29/15 0458  HGB 8.4* 7.3* 8.6*  HCT 25.7* 22.5* 25.7*           Recent Labs  10/27/15 0400 10/28/15 0432 10/29/15 0458  CREATININE 1.08* 1.32* 0.84           Results for orders placed or performed during the hospital encounter of 10/23/15 (from the past 24 hour(s))  Urine culture     Status: None (Preliminary result)   Collection Time: 10/28/15  3:26 PM  Result Value Ref Range   Specimen Description URINE, RANDOM    Special Requests NONE     Culture NO GROWTH < 24 HOURS    Report Status PENDING   CBC     Status: Abnormal   Collection Time: 10/29/15  4:58 AM  Result Value Ref Range   WBC 23.8 (H) 3.6 - 11.0 K/uL   RBC 2.90 (L) 3.80 - 5.20 MIL/uL   Hemoglobin 8.6 (L) 12.0 - 16.0 g/dL   HCT 25.7 (L) 35.0 - 47.0 %   MCV 88.7 80.0 - 100.0 fL   MCH 29.6 26.0 - 34.0 pg   MCHC 33.4 32.0 - 36.0 g/dL   RDW 16.1 (H) 11.5 - 14.5 %   Platelets 255 150 - 440 K/uL  Basic metabolic panel     Status: Abnormal   Collection Time: 10/29/15  4:58 AM  Result Value Ref Range   Sodium 138 135 - 145 mmol/L   Potassium 3.6 3.5 - 5.1 mmol/L   Chloride 97 (L) 101 - 111 mmol/L   CO2 31 22 - 32 mmol/L   Glucose, Bld 115 (H) 65 - 99 mg/dL   BUN 39 (H) 6 - 20 mg/dL   Creatinine, Ser 0.84 0.44 - 1.00 mg/dL   Calcium 8.6 (L) 8.9 - 10.3 mg/dL   GFR calc non Af Amer >60 >60 mL/min   GFR calc Af Amer >60 >60 mL/min   Anion gap 10 5 - 15  Magnesium     Status: None   Collection Time: 10/29/15  4:58 AM  Result Value Ref Range   Magnesium 2.2 1.7 - 2.4 mg/dL    Assessment/Plan: POD# 6  s/p robotic partial nephrectomy.  1) acute blood loss anemia-appropriate bump in hematocrit following 1 unit packed red blood cell transfusion.  Minimal concern for ongoing bleeding.  Hemoglobin stable.   On SQH/ VD for ppx.  2) leukocytosis- elevated without fevers/ chills.  Stable.  Will defer ID work up at this time, continue to trend unless spikes fever.  Etiology unclear, persistent since immediate post op.  Will continue to follow.    3) abdominal distention/ nausea- stable mild distention, now passing flatus without nausea or vomiting.  We will plan on maintaining nothing by mouth status today and potentially advance to clears tomorrow.    4) urinary retention x 2- maintain foley  5) CHF/ COPD exacerbation- per medicine, IV lasix, duo nebs, and hold IV hydration  6) Afib with RVR- responded to IV dilt gtt. Now in sinus rhythm.  Medicine following/ managing.     7) Delirium-  we will stop all potential medications which may be contributing including narcotics, antianxiety medication, and Benadryl.  UCx neg.  reorient patient regularly.  8) dispo-  Rehab, not yet appropriate for discharge.  Hollice Espy, MD   LOS: 6 days   Hollice Espy 10/29/2015, 2:01 PM

## 2015-10-29 NOTE — Progress Notes (Signed)
Daughter Rise Paganini, notified of pt's transfer.

## 2015-10-29 NOTE — Progress Notes (Signed)
Daughter Rise Paganini called x2 during the night for up date on pt. Update given and she stated she would be here to visit with pt. Today.

## 2015-10-29 NOTE — Progress Notes (Signed)
[place] at [place] NAME: Caroline Nichols    MR#:  [id number]  DATE OF BIRTH:  1941-03-23  SUBJECTIVE: Admitted to   Urology  service for partial nephrectomy for renal mass. Medicine was consulted last night because of atrial fibrillation with RVR. Patient was given IV Cardizem, started on Cardizem drip. And she also received IV hydration for hypotension. patient complains of abdominal pain. And not able to pass gas.  multiple attempts were made last night to put NG tube , but unsuccessful.   CHIEF COMPLAINT:  No chief complaint on file.   REVIEW OF SYSTEMS:    Review of Systems  Constitutional: Negative for fever and chills.  HENT: Negative for hearing loss.   Eyes: Negative for blurred vision, double vision and photophobia.  Respiratory: Negative for cough, hemoptysis, shortness of breath and wheezing.   Cardiovascular: Negative for palpitations, orthopnea and leg swelling.  Gastrointestinal: Positive for abdominal pain. Negative for vomiting and diarrhea.  Genitourinary: Negative for dysuria and urgency.  Musculoskeletal: Negative for myalgias and neck pain.  Skin: Negative for rash.  Neurological: Negative for dizziness, focal weakness, seizures, weakness and headaches.  Psychiatric/Behavioral: Negative for memory loss. The patient does not have insomnia.     Nutrition:  Tolerating Diet: no Tolerating PT:      DRUG ALLERGIES:   Allergies  Allergen Reactions  . Adhesive [Tape] Other (See Comments)    "irritate skin"  . Codeine     REACTION: NAUSEA  . Tetanus Toxoid     Other reaction(s): Unknown REACTION: ARM SWELLING,REDNESS  . Tetanus Toxoids   . Tramadol Nausea And Vomiting    VITALS:  Blood pressure 161/77, pulse 101, temperature 97.5 F (36.4 C), temperature source Axillary, resp. rate 20, height '5\' 2"'$  (1.575 m), weight 68.221 kg (150 lb 6.4 oz), SpO2 100 %.  PHYSICAL EXAMINATION:   Physical  Exam  GENERAL:  75 y.o.-year-old patient lying in the bed with no acute distress.  EYES: Pupils equal, round, reactive to light and accommodation. No scleral icterus. Extraocular muscles intact.  HEENT: Head atraumatic, normocephalic. Oropharynx and nasopharynx clear.  NECK:  Supple, no jugular venous distention. No thyroid enlargement, no tenderness.  LUNGS: Faint expiratory wheeze but much better than yesterday.Marland Kitchen  CARDIOVASCULAR: S1, S2 normal. No murmurs, rubs, or gallops.  ABDOMEN: Distended, bowel sounds are poor.. No organomegaly or mass.  EXTREMITIES: No pedal edema, cyanosis, or clubbing.  NEUROLOGIC: Cranial nerves II through XII are intact. Muscle strength 5/5 in all extremities. Sensation intact. Gait not checked.  PSYCHIATRIC: The patient is alert and oriented x 3.  SKIN: No obvious rash, lesion, or ulcer.    LABORATORY PANEL:   CBC  Recent Labs Lab 10/29/15 0458  WBC 23.8*  HGB 8.6*  HCT 25.7*  PLT 255   ------------------------------------------------------------------------------------------------------------------  Chemistries   Recent Labs Lab 10/29/15 0458  NA 138  K 3.6  CL 97*  CO2 31  GLUCOSE 115*  BUN 39*  CREATININE 0.84  CALCIUM 8.6*  MG 2.2   ------------------------------------------------------------------------------------------------------------------  Cardiac Enzymes No results for input(s): TROPONINI in the last 168 hours. ------------------------------------------------------------------------------------------------------------------  RADIOLOGY:  Dg Chest Port 1 View  10/28/2015  CLINICAL DATA:  Pt stats dropped down to 80s on 5L O2 this morning, pt reports right side nephrostomy tube placed 6 days ago, repeated image to confirm inspiration. EXAM: PORTABLE CHEST 1 VIEW COMPARISON:  None. FINDINGS: Low lung volumes. Normal cardiac silhouette. Mild central venous congestion. NG  tube extends into the stomach. IMPRESSION: Low lung  volumes and central venous congestion. Electronically Signed   By: Suzy Bouchard M.D.   On: 10/28/2015 08:28     ASSESSMENT AND PLAN:   Active Problems:   Renal mass   Right renal mass   Atrial fibrillation with RVR (HCC)   #1 acute hypoxic respiratory failure secondary to COPD exacerbation, CHF exacerbation. The new oxygen supplements, start IV Solu-Medrol, DuoNeb's, clinically improving #2/acute on chronic diastolic heart failure: Discussed IV Lasix yesterday. Now appears euvolemic: Further doses of Lasix, patient is nothing by mouth. #3 renal mass, patient had a right partial nephrectomy. Urology is following. #4 hypertension: Controlled. #5 . Paroxysmal  atrial fibrillation: Patient has a history of proximal atrial fibrillation. Had a RVR last night. But she is they are now in sinus rhythm and heart rate is in 80s. Discontinued the Cardizem drip. Continue metoprolol 12.5 mg by mouth twice a day.  #6 h/o of tobacco abuse ;now quit the 3 years ago. #7. Mild acute on chronic anemia: Patient has history of acute blood loss anemia due to surgery: No concern for ongoing bleeding according to urology note. Patient received 1 unit of packed RBC.  8.abdominal distention 'patient is nothing by mouth at this time. Repeat abdominal x-ray today. Unable to insert the NG tube again. Treat the constipation, repeat abdominal x-ray and see if there is any obstruction versus ileus.  possibly request surgical consult if there is a problem /   All the records are reviewed and case discussed with Care Management/Social Workerr. Management plans discussed with the patient, family and they are in agreement.  CODE STATUS: full  TOTAL TIME TAKING CARE OF THIS PATIENT: 35 minutes.   POSSIBLE D/C IN 2-3 DAYS, DEPENDING ON CLINICAL CONDITION.   Epifanio Lesches M.D on 10/29/2015 at 11:53 AM  Between 7am to 6pm - Pager - [phone]  After 6pm go to [email or web address] - password [email or web address] [facility]  Office  [phone]  CC: Primary care physician; [name], MD

## 2015-10-29 NOTE — Progress Notes (Signed)
Pt pulled NG tube out.  Called by nursing after multiple failed attempts to replace.  Pt not complaining of nausea or distension at this time.  Will hold replacing NG for now, unless pt becomes more symptomatic.  Defer to primary team for decision in AM.  Lance Coon [facility] 10/29/2015, 12:48 AM

## 2015-10-29 NOTE — Progress Notes (Signed)
Report called to [facility] on [place].

## 2015-10-29 NOTE — Progress Notes (Signed)
Pt. Pulled NG tube out, multiple attempts to reinsert made by multiple nurses, each attempt unsuccessful. Pt. Would gag  and unable to swallow tube or it would ball up in back of mouth. Dr. Jannifer Franklin paged, new order to leave the NG tube out at this time. No signs or c/o nausea, no distention noted. 170m of  green liquid in container this shift. Will continue to monitor pt.

## 2015-10-29 NOTE — Progress Notes (Signed)
Dressing to right lower quadrant changed per policy. Old dressing had moderate amount of red blood. New dressing applied and secured with tape. Small amount of serosanguinous fluid in bulb of jp drain. Patient tolerated procedure. I will continue to assess.

## 2015-10-30 ENCOUNTER — Inpatient Hospital Stay (HOSPITAL_COMMUNITY)
Admission: RE | Admit: 2015-10-30 | Discharge: 2015-10-30 | Disposition: A | Discharge status: Home / Self Care | Payer: PPO | Source: Ambulatory Visit | Attending: Internal Medicine | Admitting: Internal Medicine

## 2015-10-30 ENCOUNTER — Encounter: Payer: Self-pay | Admitting: Radiology

## 2015-10-30 ENCOUNTER — Inpatient Hospital Stay: Payer: PPO

## 2015-10-30 DIAGNOSIS — I509 Heart failure, unspecified: Secondary | ICD-10-CM

## 2015-10-30 LAB — BASIC METABOLIC PANEL
Anion gap: 6 (ref 5–15)
BUN: 47 mg/dL — AB (ref 6–20)
CHLORIDE: 104 mmol/L (ref 101–111)
CO2: 34 mmol/L — AB (ref 22–32)
CREATININE: 0.8 mg/dL (ref 0.44–1.00)
Calcium: 8.5 mg/dL — ABNORMAL LOW (ref 8.9–10.3)
GFR calc Af Amer: >60 mL/min (ref >60)
GFR calc non Af Amer: >60 mL/min (ref >60)
GLUCOSE: 114 mg/dL — AB (ref 65–99)
POTASSIUM: 3.6 mmol/L (ref 3.5–5.1)
Sodium: 144 mmol/L (ref 135–145)

## 2015-10-30 LAB — CBC
HCT: 24.3 % — ABNORMAL LOW (ref 35.0–47.0)
HEMOGLOBIN: 7.9 g/dL — AB (ref 12.0–16.0)
MCH: 29.2 pg (ref 26.0–34.0)
MCHC: 32.3 g/dL (ref 32.0–36.0)
MCV: 90.4 fL (ref 80.0–100.0)
Platelets: 286 10*3/uL (ref 150–440)
RBC: 2.69 MIL/uL — AB (ref 3.80–5.20)
RDW: 16.5 % — ABNORMAL HIGH (ref 11.5–14.5)
WBC: 18.4 10*3/uL — ABNORMAL HIGH (ref 3.6–11.0)

## 2015-10-30 LAB — ECHOCARDIOGRAM COMPLETE
Height: 62 in
Weight: 2324.8 oz

## 2015-10-30 LAB — URINE CULTURE: Culture: NO GROWTH

## 2015-10-30 LAB — SURGICAL PATHOLOGY

## 2015-10-30 MED ORDER — CEFAZOLIN SODIUM 1-5 GM-% IV SOLN
1.0000 g | Freq: Three times a day (TID) | INTRAVENOUS | Status: DC
  Administered 2015-10-30 – 2015-10-31 (×3): 1 g via INTRAVENOUS
  Filled 2015-10-30 (×5): qty 50

## 2015-10-30 MED ORDER — FUROSEMIDE 40 MG PO TABS
20.0000 mg | ORAL_TABLET | Freq: Every day | ORAL | Status: DC
  Administered 2015-10-31 – 2015-11-06 (×7): 20 mg via ORAL
  Filled 2015-10-30 (×4): qty 1
  Filled 2015-10-30: qty 0.5
  Filled 2015-10-30 (×2): qty 1

## 2015-10-30 MED ORDER — PREDNISONE 50 MG PO TABS
50.0000 mg | ORAL_TABLET | Freq: Every day | ORAL | Status: DC
  Administered 2015-10-31 – 2015-11-02 (×3): 50 mg via ORAL
  Filled 2015-10-30 (×3): qty 1

## 2015-10-30 MED ORDER — ACETAMINOPHEN 325 MG PO TABS
650.0000 mg | ORAL_TABLET | Freq: Four times a day (QID) | ORAL | Status: DC | PRN
  Administered 2015-10-31 – 2015-11-06 (×12): 650 mg via ORAL
  Filled 2015-10-30 (×12): qty 2

## 2015-10-30 MED ORDER — HALOPERIDOL LACTATE 5 MG/ML IJ SOLN
2.0000 mg | Freq: Once | INTRAMUSCULAR | Status: AC
  Administered 2015-10-30: 2 mg via INTRAVENOUS
  Filled 2015-10-30: qty 1

## 2015-10-30 MED ORDER — IOPAMIDOL (ISOVUE-370) INJECTION 76%
75.0000 mL | Freq: Once | INTRAVENOUS | Status: AC | PRN
  Administered 2015-10-30: 75 mL via INTRAVENOUS

## 2015-10-30 NOTE — Progress Notes (Signed)
Initial Nutrition Assessment     INTERVENTION:  Meals and snacks: Cater to pt preferences, await diet progression Medical Nutrition Supplement Therapy: Recommend boost breeze TID or Ensure Enlive po BID, each supplement provides 350 kcal and 20 grams of protein    NUTRITION DIAGNOSIS:   Inadequate oral intake related to altered GI function as evidenced by  (limited intake NPO/CL day 7 of admission).    GOAL:   Patient will meet greater than or equal to 90% of their needs    MONITOR:   PO intake, Diet advancement  REASON FOR ASSESSMENT:   LOS    ASSESSMENT:      Pt s/p right partial nephrectomy secondary to renal mass.  Pt with resolving ileus, CHF/COPD exacerbation previously on nonrebreather mask  Past Medical History  Diagnosis Date  . Hypertension   . Hyperthyroidism     s/p radiation therapy, now hypothyroidism  . IBS (irritable bowel syndrome)   . Herniated disc   . Pancreatic cyst   . Arthritis     Possible RA - Follow up appt with Dr. Jefm Bryant  . H/O transfusion of packed red blood cells 02/2013    3 units PRBC for severe anemia 02/2013  . Anemia 02/2013    F/U with Dr. Grayland Ormond for Feraheme injections  . Hyperlipidemia   . Allergy   . History of kidney stones   . Migraine   . Cellulitis of arm, right   . Paroxysmal a-fib ([place])   . Diabetes insipidus ([place])     On desmopressin  . H/O seasonal allergies   . Anxiety   . GERD (gastroesophageal reflux disease)   . Chronic kidney disease     Current Nutrition: clear liquids, pt reports took some liquids for lunch today  Food/Nutrition-Related History: pt reports normal intake prior to admission   Scheduled Medications:  .  ceFAZolin (ANCEF) IV  1 g Intravenous 3 times per day  . docusate sodium  100 mg Oral BID  . fluticasone  2 spray Each Nare Daily  . gabapentin  300 mg Oral TID  . heparin subcutaneous  5,000 Units Subcutaneous 3 times per day  . hydrALAZINE  10 mg Oral BID  .  hydroxychloroquine  400 mg Oral Daily  . ipratropium  1 spray Each Nare BID  . levothyroxine  50 mcg Intravenous Daily  . losartan  50 mg Oral Daily  . methylPREDNISolone (SOLU-MEDROL) injection  60 mg Intravenous Q24H  . metoprolol tartrate  12.5 mg Oral BID  . mirtazapine  7.5 mg Oral QHS  . pantoprazole (PROTONIX) IV  40 mg Intravenous Q24H       Electrolyte/Renal Profile and Glucose Profile:   Recent Labs Lab 10/27/15 0400 10/28/15 0432 10/29/15 0458 10/30/15 0536  NA 133* 137 138 144  K 4.4 4.2 3.6 3.6  CL 99* 104 97* 104  CO2 '27 26 31 '$ 34*  BUN 32* 39* 39* 47*  CREATININE 1.08* 1.32* 0.84 0.80  CALCIUM 8.3* 7.5* 8.6* 8.5*  MG 2.1  --  2.2  --   GLUCOSE 151* 161* 115* 114*    Gastrointestinal Profile: Last BM:3/27, NG tube out   Nutrition-Focused Physical Exam Findings: deferred secondary to breathing   Weight Change: stable wt prior to admission    Diet Order:  Diet clear liquid Room service appropriate?: Yes; Fluid consistency:: Thin  Skin:   reviewed   Height:   Ht Readings from Last 1 Encounters:  10/23/15 '5\' 2"'$  (1.575 m)  Weight:   Wt Readings from Last 1 Encounters:  10/29/15 145 lb 4.8 oz (65.908 kg)    Ideal Body Weight:     BMI:  Body mass index is 26.57 kg/(m^2).  Estimated Nutritional Needs:   Kcal:  BEE 1113 kcals (IF 1.0-1.2, AF 1.3) 1446-1736 kcals/d.   Protein:  (1.0-1.2 g/kg)66-79 g/d  Fluid:  (30-31m/kg) [id number]  EDUCATION NEEDS:   No education needs identified at this time  HIGH Care Level  Eather Chaires B. AlZenia ResidesRDLenoirLDPittsburghpager) Weekend/On-Call pager ([phone]

## 2015-10-30 NOTE — Progress Notes (Signed)
*  PRELIMINARY RESULTS* Echocardiogram 2D Echocardiogram has been performed.  Caroline Nichols 10/30/2015, 3:19 PM

## 2015-10-30 NOTE — Progress Notes (Signed)
Per Dr. Erlene Quan D/C order for Contact patient is no positive for VRE

## 2015-10-30 NOTE — Progress Notes (Signed)
Pt transferred to unit this shift on 5 L nasal canula.  Pt SAT's kept dropping into the low 80's. RT called for assistance and Pt put on a non rebreather mask. Pt. SAT's at 100%. Pt. ABD distended but bowel sounds audible. Pt had a Lg loose brown BM this AM. Pt confused, pulling at drains and lines and trying to get out of bed, however Pt. does respond to questions and commands.

## 2015-10-30 NOTE — Progress Notes (Signed)
Physical Therapy Treatment Patient Details Name: Caroline Nichols MRN: [id number] DOB: [birth year] Today's Date: 10/30/2015    History of Present Illness Pt underwent robotic parital R nephrectomy secondary to R renal mass and is POD#2 at time of PT evaluation. RN reports that pt has been up walking a very limited amount earlier today with staff. Pt denies falls in the last 12 months. At baseline she lives in [place] at [facility] and is independent with all ADLs. Pt eats meals at facility.    PT Comments    Pt lethargic with difficulty maintaining eyes open, but wishes to get out of bed to the chair this morning. Pt denies pain; complains of mild nausea. Pt requires Min A for bed mobility and significant increased time. Assist as well for transfers and less so for minimal ambulation to chair. Pt displays poor forward flexed, weakened posture in both sit and stand, but no loss of balance. Pt responds appropriately, but notes extreme fatigue with continued difficulty maintaining eyes open. O2 saturation improved with sit/stand to 89% on 15 liters via face mask. Once situated in chair, pt notes need to use the bathroom. Pt transfers/ambulates to bedside commode in same fashion as bed to chair. Pt requires time on bedside commode. Nursing contacted to remain with patient. While on commode right surgical drain noted to be leaking; nursing addressing. Continue PT progressing strength, endurance to improve all functional mobility.   Follow Up Recommendations  SNF     Equipment Recommendations  None recommended by PT    Recommendations for Other Services       Precautions / Restrictions Precautions Precautions: Fall Restrictions Weight Bearing Restrictions: No    Mobility  Bed Mobility Overal bed mobility: Needs Assistance Bed Mobility: Supine to Sit     Supine to sit: Min assist     General bed mobility comments: Slow moving to rise and scooting; assist for trunk elevation. O2 saturation  increased to 89% after sitting several minutes  Transfers Overall transfer level: Needs assistance Equipment used: Rolling walker (2 wheeled) Transfers: Sit to/from Stand Sit to Stand: Min assist         General transfer comment: Slow to rise/attain upright posture; cues for safe hand placement (STS performed from bed, chair )  Ambulation/Gait Ambulation/Gait assistance: Min guard Ambulation Distance (Feet): 3 Feet (performed twice; bed to chair and chair to commode) Assistive device: Rolling walker (2 wheeled) Gait Pattern/deviations: Step-to pattern;Decreased step length - right;Decreased step length - left;Trunk flexed Gait velocity: slow Gait velocity interpretation: <1.8 ft/sec, indicative of risk for recurrent falls General Gait Details: slow steps; no LOB. O2 saturation remains 89% on 15L O2   Stairs            Wheelchair Mobility    Modified Rankin (Stroke Patients Only)       Balance Overall balance assessment: Needs assistance Sitting-balance support: Bilateral upper extremity supported;Feet supported Sitting balance-Leahy Scale: Fair Sitting balance - Comments: pt remain with forward flexed posture; lethargic   Standing balance support: Bilateral upper extremity supported Standing balance-Leahy Scale: Fair Standing balance comment: Rw lowered to appropriate fit                    Cognition Arousal/Alertness: Lethargic Behavior During Therapy: Flat affect Overall Cognitive Status: Within Functional Limits for tasks assessed                      Exercises      General Comments  Pertinent Vitals/Pain Pain Assessment: No/denies pain    Home Living                      Prior Function            PT Goals (current goals can now be found in the care plan section) Progress towards PT goals: Progressing toward goals (slowly)    Frequency  Min 2X/week    PT Plan Current plan remains appropriate     Co-evaluation             End of Session Equipment Utilized During Treatment: Gait belt;Oxygen Activity Tolerance: Patient limited by fatigue Patient left: Other (comment);with family/visitor present (pt on bedside commode with nsg in room)     Time: 2179-8102 PT Time Calculation (min) (ACUTE ONLY): 25 min  Charges:  $Gait Training: 8-22 mins $Therapeutic Activity: 8-22 mins                    G Codes:      Charlaine Dalton, PTA 10/30/2015, 12:16 PM

## 2015-10-30 NOTE — Progress Notes (Signed)
Caroline Nichols at [place] NAME: Caroline Nichols    MR#:  [id number]  DATE OF BIRTH:  [birth year]  SUBJECTIVE:  CHIEF COMPLAINT:  No chief complaint on file.  - s/p right renal mass surgery, very lethargic this AM- now more alert and oriented x 2 - sitting in a chair, hypoxic, on NRB at 100% fio2  REVIEW OF SYSTEMS:  Review of Systems  Constitutional: Positive for malaise/fatigue. Negative for fever and chills.  HENT: Negative for ear discharge, ear pain and nosebleeds.   Eyes: Negative for blurred vision and double vision.  Respiratory: Positive for shortness of breath. Negative for cough and wheezing.   Cardiovascular: Negative for chest pain, palpitations and leg swelling.  Gastrointestinal: Positive for abdominal pain. Negative for nausea, vomiting, diarrhea and constipation.  Genitourinary: Negative for dysuria.  Musculoskeletal: Negative for myalgias.  Neurological: Negative for dizziness, sensory change, speech change, focal weakness, seizures and headaches.  Psychiatric/Behavioral: Negative for depression.    DRUG ALLERGIES:   Allergies  Allergen Reactions  . Adhesive [Tape] Other (See Comments)    "irritate skin"  . Codeine     REACTION: NAUSEA  . Tetanus Toxoid     Other reaction(s): Unknown REACTION: ARM SWELLING,REDNESS  . Tetanus Toxoids   . Tramadol Nausea And Vomiting    VITALS:  Blood pressure 157/46, pulse 92, temperature 99.1 F (37.3 C), temperature source Oral, resp. rate 20, height '5\' 2"'$  (1.575 m), weight 65.908 kg (145 lb 4.8 oz), SpO2 88 %.  PHYSICAL EXAMINATION:  Physical Exam  GENERAL:  75 y.o.-year-old patient sitting in the chair with no acute distress. Appears globally weak. EYES: Pupils equal, round, reactive to light and accommodation. No scleral icterus. Extraocular muscles intact.  HEENT: Head atraumatic, normocephalic. Oropharynx and nasopharynx clear.  NECK:  Supple, no jugular venous  distention. No thyroid enlargement, no tenderness.  LUNGS: Normal breath sounds bilaterally, no wheezing, rales,rhonchi or crepitation. No use of accessory muscles of respiration. Decreased bibasilar breath sounds CARDIOVASCULAR: S1, S2 normal. No murmurs, rubs, or gallops.  ABDOMEN: Soft, nontender, nondistended. Bowel sounds present. No organomegaly or mass. Tenderness in the right lower quadrant and JP drain in place. EXTREMITIES: No pedal edema, cyanosis, or clubbing.  NEUROLOGIC: Cranial nerves II through XII are intact. Muscle strength 5/5 in all extremities. Sensation intact. Gait not checked. Global weakness noted. PSYCHIATRIC: The patient is alert and oriented x 2.  SKIN: No obvious rash, lesion, or ulcer.    LABORATORY PANEL:   CBC  Recent Labs Lab 10/30/15 0536  WBC 18.4*  HGB 7.9*  HCT 24.3*  PLT 286   ------------------------------------------------------------------------------------------------------------------  Chemistries   Recent Labs Lab 10/29/15 0458 10/30/15 0536  NA 138 144  K 3.6 3.6  CL 97* 104  CO2 31 34*  GLUCOSE 115* 114*  BUN 39* 47*  CREATININE 0.84 0.80  CALCIUM 8.6* 8.5*  MG 2.2  --    ------------------------------------------------------------------------------------------------------------------  Cardiac Enzymes No results for input(s): TROPONINI in the last 168 hours. ------------------------------------------------------------------------------------------------------------------  RADIOLOGY:  Dg Abd 1 View  10/29/2015  CLINICAL DATA:  Postoperative abdominal pain following partial nephrectomy. EXAM: ABDOMEN - 1 VIEW COMPARISON:  10/27/2015. FINDINGS: RIGHT perinephric drain appears unchanged in position. Improving bowel distention, with still prominent mid abdominal small bowel loops. Distal rectal gas is present suggesting the pattern overall represents ileus. IMPRESSION: Improved bowel gas pattern.  Probable postoperative ileus.  Electronically Signed   By: Staci Righter M.D.   On: 10/29/2015  14:25    EKG:   Orders placed or performed during the hospital encounter of 10/23/15  . EKG 12-Lead  . EKG 12-Lead    ASSESSMENT AND PLAN:   75 year old female with past medical history significant for transfusion dependent anemia, arthritis, hypertension, hypothyroidism admitted with right renal mass and had partial right nephrectomy done. Medical consult requested for hypoxia and A. fib with RVR  #1 acute respiratory distress with hypoxia-COPD exacerbation. -Continue IV Solu-Medrol. Not wheezing very much today. Continue neb treatments. -Check echocardiogram mass questionable CHF concern. Known history of diastolic heart failure. -Lasix on hold today. -Currently on 100% nonrebreather mask. She had surgery and also had A. fib RVR with hypoxia-we'll get a CT chest to rule out PE.  #2 right renal mass-status post right partial nephrectomy. Management per urology. -JP drain in place and leakage noted. -Continue Ancef which was for surgical prophylaxis  #3 paroxysmal atrial fibrillation-check CT chest to rule out PE. Rate is well controlled and patient converted to normal sinus rhythm now. -On low-dose metoprolol  #4 hypertension-on losartan and metoprolol.  #5 acute delirium-likely from pain medications. Much improving. -Decreased dose of steroids. Continue Remeron at bedtime. Continue to monitor closely.  #6 DVT prophylaxis-on subcutaneous heparin    All the records are reviewed and case discussed with Care Management/Social Workerr. Management plans discussed with the patient, family and they are in agreement.  CODE STATUS: Full code  TOTAL TIME TAKING CARE OF THIS PATIENT: 37 minutes.   POSSIBLE D/C IN 2 DAYS, DEPENDING ON CLINICAL CONDITION.   Gladstone Lighter M.D on 10/30/2015 at 3:27 PM  Between 7am to 6pm - Pager - [phone]  After 6pm go to [email or web address] - password [email or web address]  Office  [phone]  CC: Primary care physician; [name], MD

## 2015-10-30 NOTE — Clinical Social Work Note (Signed)
Patient has continued to decline over the weekend and is now on 100% nonrebreather mask. CSW will defer STR bed offers for now. Shela Leff MSW,LCSW [phone]

## 2015-10-30 NOTE — Progress Notes (Signed)
Notified Dr. Erlene Quan that JP drain is leaking around the insertion site . Per Dr. Erlene Quan place order to remove JP

## 2015-10-30 NOTE — Progress Notes (Signed)
7 Days Post-Op Subjective: Passing gas and having bowel movements. Abdomen minimally distended.  Transferred back to surgical floor overnight.  Continues to have episodes of delirium.  Episode of desaturation, currently 100% on nonrebreather  Objective: Vital signs in last 24 hours: Temp:  [97.9 F (36.6 C)-99.1 F (37.3 C)] 99.1 F (37.3 C) (03/27 0438) Pulse Rate:  [90-104] 90 (03/27 0438) Resp:  [17-22] 22 (03/27 0438) BP: (157-189)/(62-87) 157/78 mmHg (03/27 0438) SpO2:  [94 %-100 %] 100 % (03/27 0438) FiO2 (%):  [100 %] 100 % (03/27 0350) Weight:  [145 lb 4.8 oz (65.908 kg)] 145 lb 4.8 oz (65.908 kg) (03/26 2249)  Intake/Output from previous day: 03/26 0701 - 03/27 0700 In: 14 [IV Piggyback:50] Out: 3299 [Urine:1650; Stool:1] Intake/Output this shift: Total I/O In: 40 [IV Piggyback:40] Out: 0   Physical Exam:  General: Sleeping this AM, did not wake. CV: No LE edema.   Lungs: No increased work of breathing. Wearing nasal cannula. GI: Soft, minimal distention, nontender, stable from yesterday.  No rebound or guarding.  No focal pain.  Nontoxic.  + BS. Incisions: Clean and dry. JP with scant serosanguineous fluid. Urine: Clear, Foley in place draining clear yellow urine.   Extremities: Nontender, no erythema, no edema.  Lab Results:  Recent Labs  10/28/15 0432 10/29/15 0458 10/30/15 0536  HGB 7.3* 8.6* 7.9*  HCT 22.5* 25.7* 24.3*           Recent Labs  10/28/15 0432 10/29/15 0458 10/30/15 0536  CREATININE 1.32* 0.84 0.80           Results for orders placed or performed during the hospital encounter of 10/23/15 (from the past 24 hour(s))  CBC     Status: Abnormal   Collection Time: 10/30/15  5:36 AM  Result Value Ref Range   WBC 18.4 (H) 3.6 - 11.0 K/uL   RBC 2.69 (L) 3.80 - 5.20 MIL/uL   Hemoglobin 7.9 (L) 12.0 - 16.0 g/dL   HCT 24.3 (L) 35.0 - 47.0 %   MCV 90.4 80.0 - 100.0 fL   MCH 29.2 26.0 - 34.0 pg   MCHC 32.3 32.0 - 36.0 g/dL   RDW  16.5 (H) 11.5 - 14.5 %   Platelets 286 150 - 440 K/uL  Basic metabolic panel     Status: Abnormal   Collection Time: 10/30/15  5:36 AM  Result Value Ref Range   Sodium 144 135 - 145 mmol/L   Potassium 3.6 3.5 - 5.1 mmol/L   Chloride 104 101 - 111 mmol/L   CO2 34 (H) 22 - 32 mmol/L   Glucose, Bld 114 (H) 65 - 99 mg/dL   BUN 47 (H) 6 - 20 mg/dL   Creatinine, Ser 0.80 0.44 - 1.00 mg/dL   Calcium 8.5 (L) 8.9 - 10.3 mg/dL   GFR calc non Af Amer >60 >60 mL/min   GFR calc Af Amer >60 >60 mL/min   Anion gap 6 5 - 15    Assessment/Plan: POD# 7 s/p robotic partial nephrectomy.  1) acute blood loss anemia-appropriate bump in hematocrit following 1 unit packed red blood cell transfusion.  Minimal concern for ongoing bleeding.  Hemoglobin stable.   On SQH/ VD for ppx.  2) leukocytosis- elevated without fevers/ chills.  Stable.  Will defer ID work up at this time, continue to trend unless spikes fever.  Etiology unclear, persistent since immediate post op.  Trending down.    3) abdominal distention/ nausea- + BM and flatus, ileus resolving.  Improved KUB yesterday. Advanced to clears, will go slow.   4) urinary retention x 2- maintain foley until ambulating  5) CHF/ COPD exacerbation- per medicine, IV lasix, duo nebs, and hold IV hydration  6) Afib with RVR- responded to IV dilt gtt. Now in sinus rhythm.  Medicine following/ managing.   7) Delirium-  Stopped all potential medications which may be contributing including narcotics, antianxiety medication, and Benadryl.  UCx neg.  reorient patient regularly.  IV tylenol prn pain.    8) dispo-  Rehab, not yet appropriate for discharge.  Reinforced need for ambulation, OOB to chair with nurse this AM.  Overall, improving.  Hollice Espy, MD   LOS: 7 days   Hollice Espy 10/30/2015, 8:56 AM

## 2015-10-31 ENCOUNTER — Inpatient Hospital Stay: Payer: PPO

## 2015-10-31 LAB — BLOOD GAS, ARTERIAL
ACID-BASE EXCESS: 14.4 mmol/L — AB (ref 0.0–3.0)
ALLENS TEST (PASS/FAIL): POSITIVE — AB
Bicarbonate: 40.3 mEq/L — ABNORMAL HIGH (ref 21.0–28.0)
FIO2: 0.4
O2 SAT: 98 %
PATIENT TEMPERATURE: 37
PCO2 ART: 58 mmHg — AB (ref 32.0–48.0)
pH, Arterial: 7.45 (ref 7.350–7.450)
pO2, Arterial: 100 mmHg (ref 83.0–108.0)

## 2015-10-31 LAB — CBC
HCT: 23.9 % — ABNORMAL LOW (ref 35.0–47.0)
HEMOGLOBIN: 7.7 g/dL — AB (ref 12.0–16.0)
MCH: 28.7 pg (ref 26.0–34.0)
MCHC: 32.1 g/dL (ref 32.0–36.0)
MCV: 89.4 fL (ref 80.0–100.0)
PLATELETS: 296 10*3/uL (ref 150–440)
RBC: 2.67 MIL/uL — AB (ref 3.80–5.20)
RDW: 16.6 % — ABNORMAL HIGH (ref 11.5–14.5)
WBC: 18.1 10*3/uL — AB (ref 3.6–11.0)

## 2015-10-31 LAB — BASIC METABOLIC PANEL
ANION GAP: 3 — AB (ref 5–15)
BUN: 29 mg/dL — ABNORMAL HIGH (ref 6–20)
CALCIUM: 8.3 mg/dL — AB (ref 8.9–10.3)
CHLORIDE: 104 mmol/L (ref 101–111)
CO2: 36 mmol/L — ABNORMAL HIGH (ref 22–32)
Creatinine, Ser: 0.55 mg/dL (ref 0.44–1.00)
GFR calc Af Amer: >60 mL/min (ref >60)
GLUCOSE: 89 mg/dL (ref 65–99)
POTASSIUM: 3.8 mmol/L (ref 3.5–5.1)
SODIUM: 143 mmol/L (ref 135–145)

## 2015-10-31 MED ORDER — PANTOPRAZOLE SODIUM 40 MG PO TBEC
40.0000 mg | DELAYED_RELEASE_TABLET | Freq: Every day | ORAL | Status: DC
  Administered 2015-10-31 – 2015-11-06 (×7): 40 mg via ORAL
  Filled 2015-10-31 (×7): qty 1

## 2015-10-31 MED ORDER — MORPHINE SULFATE (PF) 2 MG/ML IV SOLN
1.0000 mg | INTRAVENOUS | Status: DC | PRN
  Administered 2015-10-31 – 2015-11-01 (×2): 1 mg via INTRAVENOUS
  Filled 2015-10-31 (×2): qty 1

## 2015-10-31 MED ORDER — HYDRALAZINE HCL 20 MG/ML IJ SOLN
10.0000 mg | Freq: Four times a day (QID) | INTRAMUSCULAR | Status: DC | PRN
  Administered 2015-10-31 – 2015-11-01 (×2): 10 mg via INTRAVENOUS
  Filled 2015-10-31 (×2): qty 1

## 2015-10-31 MED ORDER — ENSURE ENLIVE PO LIQD
237.0000 mL | Freq: Two times a day (BID) | ORAL | Status: DC
  Administered 2015-10-31 – 2015-11-06 (×9): 237 mL via ORAL

## 2015-10-31 MED ORDER — ALPRAZOLAM 0.25 MG PO TABS
0.2500 mg | ORAL_TABLET | Freq: Once | ORAL | Status: AC
  Administered 2015-10-31: 0.25 mg via ORAL
  Filled 2015-10-31: qty 1

## 2015-10-31 MED ORDER — SENNA 8.6 MG PO TABS
2.0000 | ORAL_TABLET | Freq: Every day | ORAL | Status: DC
  Administered 2015-10-31 – 2015-11-06 (×5): 17.2 mg via ORAL
  Filled 2015-10-31 (×7): qty 2

## 2015-10-31 MED ORDER — LEVOTHYROXINE SODIUM 25 MCG PO TABS
125.0000 ug | ORAL_TABLET | Freq: Every day | ORAL | Status: DC
  Administered 2015-10-31 – 2015-11-06 (×7): 125 ug via ORAL
  Filled 2015-10-31 (×7): qty 1

## 2015-10-31 MED ORDER — LABETALOL HCL 5 MG/ML IV SOLN
10.0000 mg | INTRAVENOUS | Status: DC | PRN
  Administered 2015-11-01: 10 mg via INTRAVENOUS
  Filled 2015-10-31 (×3): qty 4

## 2015-10-31 MED ORDER — POLYETHYLENE GLYCOL 3350 17 G PO PACK
17.0000 g | PACK | Freq: Every day | ORAL | Status: DC
  Administered 2015-10-31 – 2015-11-03 (×4): 17 g via ORAL
  Filled 2015-10-31 (×4): qty 1

## 2015-10-31 MED ORDER — DOCUSATE SODIUM 100 MG PO CAPS
200.0000 mg | ORAL_CAPSULE | Freq: Two times a day (BID) | ORAL | Status: DC
  Administered 2015-10-31 – 2015-11-05 (×8): 200 mg via ORAL
  Filled 2015-10-31 (×9): qty 2

## 2015-10-31 NOTE — Progress Notes (Signed)
Physical Therapy Treatment Patient Details Name: Caroline Nichols MRN: [id number] DOB: June 04, 1941 Today's Date: 10/31/2015    History of Present Illness Pt underwent robotic parital R nephrectomy secondary to R renal mass and is POD#2 at time of PT evaluation. RN reports that pt has been up walking a very limited amount earlier today with staff. Pt denies falls in the last 12 months. At baseline she lives in [place] [facility] and is independent with all ADLs. Pt eats meals at facility.    PT Comments    Pt extremely fatigued/lethargic today and wishes to stay in bed this morning. Pt requests to stop visitors unless it is family, as she is worn out. Spoke with nursing regarding. Pt participates in bed exercises with assist as needed; does fatigue easily. Voice weakened and breathless at times; O2 saturation remains 93% throughout. Continue PT as tolerated to progress strength, endurance and all functional mobility.   Follow Up Recommendations  SNF     Equipment Recommendations  None recommended by PT    Recommendations for Other Services       Precautions / Restrictions Precautions Precautions: Fall Restrictions Weight Bearing Restrictions: No    Mobility  Bed Mobility               General bed mobility comments: Not tested; pt requests to stay in bed due to lethargy and general malaise  Transfers                    Ambulation/Gait                 Stairs            Wheelchair Mobility    Modified Rankin (Stroke Patients Only)       Balance                                    Cognition Arousal/Alertness: Lethargic Behavior During Therapy: WFL for tasks assessed/performed Overall Cognitive Status: Within Functional Limits for tasks assessed                      Exercises General Exercises - Lower Extremity Ankle Circles/Pumps: AROM;Both;20 reps;Supine Quad Sets: Strengthening;Both;20 reps;Supine Gluteal Sets:  Strengthening;Both;20 reps;Supine Short Arc Quad: AROM;Both;20 reps;Supine Heel Slides: AROM;Both;20 reps;Supine Hip ABduction/ADduction: Both;20 reps;Supine;AROM (assist just to keep leg off bed) Straight Leg Raises: AAROM;Both;10 reps;Supine (2 sets each)    General Comments        Pertinent Vitals/Pain Pain Assessment:  (Reports gas pains only)    Home Living                      Prior Function            PT Goals (current goals can now be found in the care plan section) Progress towards PT goals: Progressing toward goals (slowly)    Frequency  Min 2X/week    PT Plan Current plan remains appropriate    Co-evaluation             End of Session Equipment Utilized During Treatment: Oxygen Activity Tolerance: Patient limited by fatigue;Patient limited by lethargy Patient left: in bed;with call bell/phone within reach;with bed alarm set     Time: 1050-1114 PT Time Calculation (min) (ACUTE ONLY): 24 min  Charges:  $Therapeutic Exercise: 23-37 mins  G CodesCharlaine Dalton, PTA 10/31/2015, 11:16 AM

## 2015-10-31 NOTE — Progress Notes (Signed)
Per Dr. Tressia Miners pt. Was placed on HFNC. She was placed on 40 L and 40% to achieve a Sa02 of 92%.

## 2015-10-31 NOTE — Progress Notes (Signed)
10/31/2015 2:52 PM  After viewing pt's ABG, Dr Tressia Miners recommended a one-time dose of anxiety medicine to help calm pt and slow respiratory rate.  Will give med and continue to monitor and assess.  Dola Argyle, RN

## 2015-10-31 NOTE — Progress Notes (Signed)
Pt is resting at this time, O2 stats dropped to 82% on 6 Liters, RN noticed that pt is sleeping with her mouth open, breathing from her mouth. A nonrebreather mask was placed on pt. O2 stats is now 100%. Doctor Jannifer Franklin was notified of pt O2 stats and that RN applied nonrebreather, Doctor Jannifer Franklin is ok with the administration of the nonrebreather at this time. Will continue to monitor pt.   Angus Seller

## 2015-10-31 NOTE — Progress Notes (Addendum)
8 Days Post-Op Subjective: Regular BMs and flatus without n/v.    Continues to have episodes of desaturation.  CT PA negative (bibasilar atelectasis/ small effusion), echo normal.    Reports she is feeling much better today, mental status improved.    JP removed yesterday.    Objective: Vital signs in last 24 hours: Temp:  [98.3 F (36.8 C)-98.8 F (37.1 C)] 98.3 F (36.8 C) (03/28 0509) Pulse Rate:  [78-100] 78 (03/28 0509) Resp:  [18-20] 18 (03/28 0509) BP: (138-157)/(46-63) 146/63 mmHg (03/28 0509) SpO2:  [85 %-100 %] 100 % (03/28 0509)  Intake/Output from previous day: 03/27 0701 - 03/28 0700 In: 280 [P.O.:240; IV Piggyback:40] Out: 1625 [Urine:1625] Intake/Output this shift:    Physical Exam:  General: Alert and oriented.  Answering all questions appropriately. CV: No LE edema.   Lungs: No increased work of breathing. Wearing nasal cannula. GI: Soft, non distended, nontender, stable from yesterday.  No rebound or guarding.  + BS. Incisions: Clean and dry.  Urine: Clear, Foley in place draining clear yellow urine.   Extremities: Nontender, no erythema, no edema.  Lab Results:  Recent Labs  10/29/15 0458 10/30/15 0536 10/31/15 0357  HGB 8.6* 7.9* 7.7*  HCT 25.7* 24.3* 23.9*           Recent Labs  10/29/15 0458 10/30/15 0536 10/31/15 0357  CREATININE 0.84 0.80 0.55           Results for orders placed or performed during the hospital encounter of 10/23/15 (from the past 24 hour(s))  CBC     Status: Abnormal   Collection Time: 10/31/15  3:57 AM  Result Value Ref Range   WBC 18.1 (H) 3.6 - 11.0 K/uL   RBC 2.67 (L) 3.80 - 5.20 MIL/uL   Hemoglobin 7.7 (L) 12.0 - 16.0 g/dL   HCT 23.9 (L) 35.0 - 47.0 %   MCV 89.4 80.0 - 100.0 fL   MCH 28.7 26.0 - 34.0 pg   MCHC 32.1 32.0 - 36.0 g/dL   RDW 16.6 (H) 11.5 - 14.5 %   Platelets 296 150 - 440 K/uL  Basic metabolic panel     Status: Abnormal   Collection Time: 10/31/15  3:57 AM  Result Value Ref Range    Sodium 143 135 - 145 mmol/L   Potassium 3.8 3.5 - 5.1 mmol/L   Chloride 104 101 - 111 mmol/L   CO2 36 (H) 22 - 32 mmol/L   Glucose, Bld 89 65 - 99 mg/dL   BUN 29 (H) 6 - 20 mg/dL   Creatinine, Ser 0.55 0.44 - 1.00 mg/dL   Calcium 8.3 (L) 8.9 - 10.3 mg/dL   GFR calc non Af Amer >60 >60 mL/min   GFR calc Af Amer >60 >60 mL/min   Anion gap 3 (L) 5 - 15    CLINICAL DATA: 75 year old female inpatient with hypoxia. Recent right partial nephrectomy for renal mass.  EXAM: CT ANGIOGRAPHY CHEST WITH CONTRAST  TECHNIQUE: Multidetector CT imaging of the chest was performed using the standard protocol during bolus administration of intravenous contrast. Multiplanar CT image reconstructions and MIPs were obtained to evaluate the vascular anatomy.  CONTRAST: 75 cc Isovue 370 IV.  COMPARISON: 10/28/2015 chest radiograph. 03/09/2013 chest CT angiogram.  FINDINGS: Mediastinum/Nodes: The study is high quality for the evaluation of pulmonary embolism. There are no filling defects in the central, lobar, segmental or subsegmental pulmonary artery branches to suggest acute pulmonary embolism. Great vessels are normal in course and caliber. Normal heart  size. Trace pericardial fluid/thickening. Coronary atherosclerosis. Thyroid appears atrophic versus surgically absent. Normal esophagus. No pathologically enlarged axillary, mediastinal or hilar lymph nodes.  Lungs/Pleura: No pneumothorax. Layering small right and trace left pleural effusions. Mild-to-moderate compressive atelectasis in the bilateral dependent lower lobes. Mild centrilobular and paraseptal emphysema with diffuse bronchial wall thickening. There is a 1.0 x 0.5 cm subpleural nodule in the anterior apical right upper lobe (series 6/ image 17), new since 03/09/2013. No additional significant pulmonary nodules in the aerated lungs. Mild interlobular septal thickening in both lungs.  Upper abdomen:  Unremarkable.  Musculoskeletal: No aggressive appearing focal osseous lesions. Marked degenerative changes in the thoracic spine.  Review of the MIP images confirms the above findings.  IMPRESSION: 1. No pulmonary embolism. 2. Small right and trace left pleural effusions with mild to moderate bilateral lower lobe atelectasis. 3. Mild interlobular septal thickening in both lungs, suggesting mild pulmonary edema. 4. Coronary atherosclerosis.   Electronically Signed  By: Ilona Sorrel M.D.  On: 10/30/2015 17:39  Echo reviewed.     Assessment/Plan: POD# 8 s/p robotic partial nephrectomy.  Pathology consistent with RCC (papillary clear cell), pT1NxM0, margins negative.    1) acute blood loss anemia-appropriate bump in hematocrit following 1 unit packed red blood cell transfusion.  No concern for ongoing bleeding.  Hemoglobin stable.   On SQH/ VD for ppx.  2) leukocytosis- elevated without fevers/ chills.  Stable.  Will defer ID work up at this time, continue to trend unless spikes fever.  Etiology unclear, persistent since immediate post op.  Trending down.  OK to stop ancef today since JP drain removed yesterday.    3) Ileus- resolved.  Advance to soft diet/ Ensure supplementation.  4) urinary retention x 2- maintain foley until ambulating regularly.  5) CHF/ COPD exacerbation/ hypoxia- per medicine.  CT PA negative.    6) Afib with RVR- responded to IV dilt gtt. Now in sinus rhythm, resolved.  Medicine following/ managing.   7) Delirium-  Stopped all potential medications which may be contributing including narcotics, antianxiety medication, and Benadryl.  UCx neg.  reorient patient regularly. Tylenol for pain.    8) dispo-  Rehab, not yet appropriate for discharge.  Reinforced need for ambulation, OOB to chair.  Overall, improving.  Hollice Espy, MD   LOS: 8 days   Hollice Espy 10/31/2015, 8:24 AM

## 2015-10-31 NOTE — Progress Notes (Signed)
10/31/2015 2:07 PM  NT Cornell Barman informed that pt was breathing at a rate of 36 respirations/minute.  I went to the pt's room and counted 32/minute.  Pt Sp02 was 100 percent on hi-flow nasal cannula.  Pt denies feeling short of breath but states that she feels "tired."  Notified Dr. Tressia Miners who ordered a stat ABG.  Will wait for results from ABG and continue to monitor and assess.  Dola Argyle, RN

## 2015-10-31 NOTE — Progress Notes (Signed)
Responded to rapid response.  Patient continues to c/o of intense pain.  Remains on HFNC.  Saturation good with rest however desaturation per rn with ambulation.  RR increased as well as BP due to pain.  No new orders at this time

## 2015-10-31 NOTE — Progress Notes (Signed)
Rapid response called . Pt. Noted to be in bed alert and oriented with Hi flo [place] pt has respirations of 35 sats 99% Pt c/o abd pain in the upper center area pt states feels like gas pain. Pt medicated per nurse . Pt calm and had received medication for pain. physician notified .

## 2015-10-31 NOTE — Progress Notes (Signed)
[place] [facility] [place] NAME: Caroline Nichols    MR#:  [id number]  DATE OF BIRTH:  1941-02-26  SUBJECTIVE:  CHIEF COMPLAINT:  No chief complaint on file.  - s/p right renal mass surgery, more alert now, dyspneic appearing - desaturated last night and on NRB today- changed to high flow nasal cannula - passing flatus, JP drain removed - overall feels much better  REVIEW OF SYSTEMS:  Review of Systems  Constitutional: Positive for malaise/fatigue. Negative for fever and chills.  HENT: Negative for ear discharge, ear pain and nosebleeds.   Eyes: Negative for blurred vision and double vision.  Respiratory: Positive for shortness of breath. Negative for cough and wheezing.   Cardiovascular: Negative for chest pain, palpitations and leg swelling.  Gastrointestinal: Positive for abdominal pain. Negative for nausea, vomiting, diarrhea and constipation.  Genitourinary: Negative for dysuria.  Musculoskeletal: Negative for myalgias.  Neurological: Negative for dizziness, sensory change, speech change, focal weakness, seizures and headaches.  Psychiatric/Behavioral: Negative for depression.    DRUG ALLERGIES:   Allergies  Allergen Reactions  . Adhesive [Tape] Other (See Comments)    "irritate skin"  . Codeine     REACTION: NAUSEA  . Tetanus Toxoid     Other reaction(s): Unknown REACTION: ARM SWELLING,REDNESS  . Tetanus Toxoids   . Tramadol Nausea And Vomiting    VITALS:  Blood pressure 146/63, pulse 78, temperature 98.3 F (36.8 C), temperature source Oral, resp. rate 18, height '5\' 2"'$  (1.575 m), weight 65.908 kg (145 lb 4.8 oz), SpO2 100 %.  PHYSICAL EXAMINATION:  Physical Exam  GENERAL:  75 y.o.-year-old patient sitting in the chair with no acute distress. Appears globally weak. EYES: Pupils equal, round, reactive to light and accommodation. No scleral icterus. Extraocular muscles intact.  HEENT: Head atraumatic, normocephalic.  Oropharynx and nasopharynx clear.  NECK:  Supple, no jugular venous distention. No thyroid enlargement, no tenderness.  LUNGS: Normal breath sounds bilaterally, no wheezing, rales,rhonchi or crepitation. No use of accessory muscles of respiration. Decreased bibasilar breath sounds Dyspnea on exertion CARDIOVASCULAR: S1, S2 normal. No murmurs, rubs, or gallops.  ABDOMEN: Soft, nontender, nondistended. Bowel sounds present. No organomegaly or mass.  EXTREMITIES: No pedal edema, cyanosis, or clubbing.  NEUROLOGIC: Cranial nerves II through XII are intact. Muscle strength 5/5 in all extremities. Sensation intact. Gait not checked. Global weakness noted. PSYCHIATRIC: The patient is alert and oriented x 2.  SKIN: No obvious rash, lesion, or ulcer.    LABORATORY PANEL:   CBC  Recent Labs Lab 10/31/15 0357  WBC 18.1*  HGB 7.7*  HCT 23.9*  PLT 296   ------------------------------------------------------------------------------------------------------------------  Chemistries   Recent Labs Lab 10/29/15 0458  10/31/15 0357  NA 138  < > 143  K 3.6  < > 3.8  CL 97*  < > 104  CO2 31  < > 36*  GLUCOSE 115*  < > 89  BUN 39*  < > 29*  CREATININE 0.84  < > 0.55  CALCIUM 8.6*  < > 8.3*  MG 2.2  --   --   < > = values in this interval not displayed. ------------------------------------------------------------------------------------------------------------------  Cardiac Enzymes No results for input(s): TROPONINI in the last 168 hours. ------------------------------------------------------------------------------------------------------------------  RADIOLOGY:  Dg Abd 1 View  10/29/2015  CLINICAL DATA:  Postoperative abdominal pain following partial nephrectomy. EXAM: ABDOMEN - 1 VIEW COMPARISON:  10/27/2015. FINDINGS: RIGHT perinephric drain appears unchanged in position. Improving bowel distention, with still prominent mid abdominal  small bowel loops. Distal rectal gas is present  suggesting the pattern overall represents ileus. IMPRESSION: Improved bowel gas pattern.  Probable postoperative ileus. Electronically Signed   By: Staci Righter M.D.   On: 10/29/2015 14:25   Ct Angio Chest Pe W/cm &/or Wo Cm  10/30/2015  CLINICAL DATA:  75 year old female inpatient with hypoxia. Recent right partial nephrectomy for renal mass. EXAM: CT ANGIOGRAPHY CHEST WITH CONTRAST TECHNIQUE: Multidetector CT imaging of the chest was performed using the standard protocol during bolus administration of intravenous contrast. Multiplanar CT image reconstructions and MIPs were obtained to evaluate the vascular anatomy. CONTRAST:  75 cc Isovue 370 IV. COMPARISON:  10/28/2015 chest radiograph. 03/09/2013 chest CT angiogram. FINDINGS: Mediastinum/Nodes: The study is high quality for the evaluation of pulmonary embolism. There are no filling defects in the central, lobar, segmental or subsegmental pulmonary artery branches to suggest acute pulmonary embolism. Great vessels are normal in course and caliber. Normal heart size. Trace pericardial fluid/thickening. Coronary atherosclerosis. Thyroid appears atrophic versus surgically absent. Normal esophagus. No pathologically enlarged axillary, mediastinal or hilar lymph nodes. Lungs/Pleura: No pneumothorax. Layering small right and trace left pleural effusions. Mild-to-moderate compressive atelectasis in the bilateral dependent lower lobes. Mild centrilobular and paraseptal emphysema with diffuse bronchial wall thickening. There is a 1.0 x 0.5 cm subpleural nodule in the anterior apical right upper lobe (series 6/ image 17), new since 03/09/2013. No additional significant pulmonary nodules in the aerated lungs. Mild interlobular septal thickening in both lungs. Upper abdomen: Unremarkable. Musculoskeletal: No aggressive appearing focal osseous lesions. Marked degenerative changes in the thoracic spine. Review of the MIP images confirms the above findings. IMPRESSION: 1.  No pulmonary embolism. 2. Small right and trace left pleural effusions with mild to moderate bilateral lower lobe atelectasis. 3. Mild interlobular septal thickening in both lungs, suggesting mild pulmonary edema. 4. Coronary atherosclerosis. Electronically Signed   By: Ilona Sorrel M.D.   On: 10/30/2015 17:39    EKG:   Orders placed or performed during the hospital encounter of 10/23/15  . EKG 12-Lead  . EKG 12-Lead    ASSESSMENT AND PLAN:   75 year old female with past medical history significant for transfusion dependent anemia, arthritis, hypertension, hypothyroidism admitted with right renal mass and had partial right nephrectomy done. Medical consult requested for hypoxia and A. fib with RVR  #1 Acute respiratory distress with hypoxia-COPD exacerbation. -Improving, change solumedrol to prednisone -Continue neb treatments. - echo with normal   Ejection fraction. Likely diastolic dysfunction. --Currently on 100% nonrebreather mask. Change to high flow nasal cannula today. -CT of the chest negative for PE, mild pulmonary vascular congestion noted. Continue daily Lasix orally. - Encourage ambulation and incentive spirometry  #2 right renal mass-status post right partial nephrectomy. Management per urology. -JP drain removed and off of Ancef which was for surgical prophylaxis - post op ileus- on clear liquids, now passing flatus, advance diet as tolerated  #3 paroxysmal atrial fibrillation- off cardizem drip- converted to NSR Rate is well controlled and patient converted to normal sinus rhythm now. -On low-dose metoprolol  #4 hypertension-on losartan and metoprolol.  #5 acute delirium-likely from pain medications. improving. -Decreased dose of steroids. Continue Remeron at bedtime. Continue to monitor closely.  #6 DVT prophylaxis-on subcutaneous heparin  #7 Anemia- received 1 unit Tx post op, hb at 7.7 Transfuse if hb<7.5  Disposition to rehab when medically stable    All  the records are reviewed and case discussed with Care Management/Social Workerr. Management plans discussed with the patient,  family and they are in agreement.  CODE STATUS: Full code  TOTAL TIME TAKING CARE OF THIS PATIENT: 37 minutes.   POSSIBLE D/C IN 2 DAYS, DEPENDING ON CLINICAL CONDITION.   Gladstone Lighter M.D on 10/31/2015 at 9:04 AM  Between 7am to 6pm - Pager - [phone]  After 6pm go to [email or web address] - password [email or web address] [facility]  Office  [phone]  CC: Primary care physician; [name], MD

## 2015-11-01 LAB — CBC WITH DIFFERENTIAL/PLATELET
Basophils Absolute: 0 10*3/uL (ref 0–0.1)
Basophils Relative: 0 %
Eosinophils Absolute: 0 10*3/uL (ref 0–0.7)
HCT: 24.3 % — ABNORMAL LOW (ref 35.0–47.0)
HEMOGLOBIN: 7.7 g/dL — AB (ref 12.0–16.0)
LYMPHS ABS: 1 10*3/uL (ref 1.0–3.6)
MCH: 28.9 pg (ref 26.0–34.0)
MCHC: 31.8 g/dL — AB (ref 32.0–36.0)
MCV: 91 fL (ref 80.0–100.0)
MONO ABS: 1.5 10*3/uL — AB (ref 0.2–0.9)
Monocytes Relative: 11 %
Neutro Abs: 11.1 10*3/uL — ABNORMAL HIGH (ref 1.4–6.5)
Neutrophils Relative %: 82 %
PLATELETS: 316 10*3/uL (ref 150–440)
RBC: 2.67 MIL/uL — ABNORMAL LOW (ref 3.80–5.20)
RDW: 16.3 % — AB (ref 11.5–14.5)
WBC: 13.6 10*3/uL — ABNORMAL HIGH (ref 3.6–11.0)

## 2015-11-01 LAB — TROPONIN I
Troponin I: 0.08 ng/mL — ABNORMAL HIGH (ref <0.031)
Troponin I: 0.09 ng/mL — ABNORMAL HIGH (ref <0.031)
Troponin I: 0.1 ng/mL — ABNORMAL HIGH (ref <0.031)
Troponin I: 0.1 ng/mL — ABNORMAL HIGH (ref <0.031)
Troponin I: 0.11 ng/mL — ABNORMAL HIGH (ref <0.031)

## 2015-11-01 MED ORDER — SIMETHICONE 80 MG PO CHEW
160.0000 mg | CHEWABLE_TABLET | Freq: Three times a day (TID) | ORAL | Status: AC
  Administered 2015-11-01 – 2015-11-02 (×6): 160 mg via ORAL
  Filled 2015-11-01 (×6): qty 2

## 2015-11-01 MED ORDER — KETOROLAC TROMETHAMINE 15 MG/ML IJ SOLN: 15.0000 mg | Freq: Once | INTRAMUSCULAR | Status: DC

## 2015-11-01 NOTE — Progress Notes (Addendum)
RN notified Doctor Posey Pronto concerning pt BP 187/73, O2 stats decreased to 72% on a HFNC, respirations increased to high 30's. Pt is currently on bedside commode and complains of a sharp gas pain in her upper stomach that has not been able to pass, feels like she needs to have a bowel movement, pt pain is 10/10. Doctor Posey Pronto notified of all the findings and new orders to give Miralax, Senna, hydralazine 10 mg IV, and morphine 1 mg IV at this time. Will continue to monitor pt.   Caroline Nichols

## 2015-11-01 NOTE — Plan of Care (Signed)
Problem: Activity: Goal: Ability to tolerate increased activity will improve Outcome: Not Progressing Pt O2 stats decrease to 72 % on HFNC when pt ambulated to the bedside commode. When pt was helped back to the bed her O2 stats went up to 98-100%. Will continue to monitor.   Angus Seller

## 2015-11-01 NOTE — Progress Notes (Signed)
9 Days Post-Op Subjective: Multiple episodes of desats yesterday and tachypnea.  Treated with narcotics and anxiolytics.   Rapid response overnight for desat and upper mid abdomen (patient states related to gas pain).  Currently on Hi flo Mallard.    Mildly distended this AM complaining of abdominal tightness, nausea without vomiting which is improved significantly after a large bowel movement.  Trop bump to 0.1  Objective: Vital signs in last 24 hours: Temp:  [98.4 F (36.9 C)-99.1 F (37.3 C)] 98.5 F (36.9 C) (03/29 0527) Pulse Rate:  [71-90] 71 (03/29 0630) Resp:  [19-36] 19 (03/29 0527) BP: (154-187)/(58-85) 154/64 mmHg (03/29 0630) SpO2:  [95 %-100 %] 100 % (03/29 0527) FiO2 (%):  [42 %-43 %] 42 % (03/28 2142)  Intake/Output from previous day: 03/28 0701 - 03/29 0700 In: 240 [P.O.:240] Out: 950 [Urine:950] Intake/Output this shift: Total I/O In: 240 [P.O.:240] Out: -   Physical Exam:  General: Alert and oriented.  Answering all questions appropriately. CV: No LE edema.   Lungs: No increased work of breathing. Wearing nasal cannula. GI: Soft, mildly distended, nontender, slightly worse since yesterday.  No rebound or guarding.  + BS. Incisions: Clean and dry.  Urine: Clear, Foley in place draining clear yellow urine.   Extremities: Nontender, no erythema, no edema.  Lab Results:  Recent Labs  10/30/15 0536 10/31/15 0357 11/01/15 0518  HGB 7.9* 7.7* 7.7*  HCT 24.3* 23.9* 24.3*           Recent Labs  10/29/15 0458 10/30/15 0536 10/31/15 0357  CREATININE 0.84 0.80 0.55           Results for orders placed or performed during the hospital encounter of 10/23/15 (from the past 24 hour(s))  Blood gas, arterial     Status: Abnormal   Collection Time: 10/31/15  2:10 PM  Result Value Ref Range   FIO2 0.40    Mode HI FLOW NASAL CANNULA    pH, Arterial 7.45 7.350 - 7.450   pCO2 arterial 58 (H) 32.0 - 48.0 mmHg   pO2, Arterial 100 83.0 - 108.0 mmHg   Bicarbonate 40.3 (H) 21.0 - 28.0 mEq/L   Acid-Base Excess 14.4 (H) 0.0 - 3.0 mmol/L   O2 Saturation 98.0 %   Patient temperature 37.0    Collection site RIGHT RADIAL    Sample type ARTERIAL DRAW    Allens test (pass/fail) POSITIVE (A) PASS  Troponin I     Status: Abnormal   Collection Time: 11/01/15 12:02 AM  Result Value Ref Range   Troponin I 0.11 (H) <0.031 ng/mL  CBC with Differential/Platelet     Status: Abnormal   Collection Time: 11/01/15  5:18 AM  Result Value Ref Range   WBC 13.6 (H) 3.6 - 11.0 K/uL   RBC 2.67 (L) 3.80 - 5.20 MIL/uL   Hemoglobin 7.7 (L) 12.0 - 16.0 g/dL   HCT 24.3 (L) 35.0 - 47.0 %   MCV 91.0 80.0 - 100.0 fL   MCH 28.9 26.0 - 34.0 pg   MCHC 31.8 (L) 32.0 - 36.0 g/dL   RDW 16.3 (H) 11.5 - 14.5 %   Platelets 316 150 - 440 K/uL   Neutrophils Relative % 82% %   Neutro Abs 11.1 (H) 1.4 - 6.5 K/uL   Lymphocytes Relative 7% %   Lymphs Abs 1.0 1.0 - 3.6 K/uL   Monocytes Relative 11% %   Monocytes Absolute 1.5 (H) 0.2 - 0.9 K/uL   Eosinophils Relative 0% %   Eosinophils  Absolute 0.0 0 - 0.7 K/uL   Basophils Relative 0% %   Basophils Absolute 0.0 0 - 0.1 K/uL  Troponin I     Status: Abnormal   Collection Time: 11/01/15  5:18 AM  Result Value Ref Range   Troponin I 0.10 (H) <0.031 ng/mL  Troponin I     Status: Abnormal   Collection Time: 11/01/15 11:18 AM  Result Value Ref Range   Troponin I 0.10 (H) <0.031 ng/mL    Assessment/Plan: POD# 9 s/p robotic partial nephrectomy.  Pathology consistent with RCC (papillary clear cell), pT1NxM0, margins negative.    1) acute blood loss anemia-appropriate bump in hematocrit following 1 unit packed red blood cell transfusion.  No concern for ongoing bleeding.  Hemoglobin stable.   On SQH/ VD for ppx.  2) leukocytosis- elevated without fevers/ chills.  Stable.  Will defer ID work up at this time, continue to trend unless spikes fever.  Etiology unclear, persistent since immediate post op.  Trending down nearly normal  today.    3) Ileus- Slightly more distended today but + BM/ flatus.  Back down to clears/ Ensure supplementation.  PLEASE AVOID NARCOTICS.    4) urinary retention x 2- maintain foley until ambulating regularly.   5) CHF/ COPD exacerbation/ hypoxia- per medicine.  CT PA negative.  Echo normal.  Continues to have desats.  6) Afib with RVR- responded to IV dilt gtt. Now in sinus rhythm, resolved.  Medicine following/ managing.   7) Delirium-  Stopped all potential medications which may be contributing including narcotics, antianxiety medication, and Benadryl.  UCx neg.  reorient patient regularly. Tylenol for pain.    8) dispo-  Rehab, not yet appropriate for discharge.  Reinforced need for ambulation, OOB to chair.    Hollice Espy, MD   LOS: 9 days   Hollice Espy 11/01/2015, 12:16 PM

## 2015-11-01 NOTE — Progress Notes (Signed)
PT Cancellation Note  Patient Details Name: Caroline Nichols MRN: [id number] DOB: [birth year]   Cancelled Treatment:    Reason Eval/Treat Not Completed: Patient declined, no reason specified. Pt had a difficult night with rapid response episode as well as significant O2 saturation decrease with out of bed. No new orders currently. Treatment attempted, pt responsive although extremely lethargic. Pt notes she is not feeling well and is unable to tolerate any activity today. Pt continues to rest comfortably. Re attempt treatment at a later date this week as appropriate.    Charlaine Dalton, [place] 11/01/2015, 10:52 AM

## 2015-11-01 NOTE — Progress Notes (Signed)
Notified Doctor Jannifer Franklin of pt troponin level at 0.11, no new orders give at this time. Next troponin level will be taken in 6 hours. Will continue to monitor pt.  Caroline Nichols

## 2015-11-01 NOTE — Care Management Important Message (Signed)
Important Message  Patient Details  Name: Caroline Nichols MRN: [id number] Date of Birth: 05/08/1941   Medicare Important Message Given:  Yes    Beverly Sessions, RN 11/01/2015, 10:53 AM

## 2015-11-01 NOTE — Progress Notes (Signed)
[place] at [place] NAME: Caroline Nichols    MR#:  [id number]  DATE OF BIRTH:  July 27, 1941  SUBJECTIVE:  CHIEF COMPLAINT:  No chief complaint on file.  - s/p right renal mass surgery, episodes of desaturations noted- very weak, was able to only pull 250cc via incentive spirometer - on 40% high flow nasal cannula - had BMs today  REVIEW OF SYSTEMS:  Review of Systems  Constitutional: Positive for malaise/fatigue. Negative for fever and chills.  HENT: Negative for ear discharge, ear pain and nosebleeds.   Eyes: Negative for blurred vision and double vision.  Respiratory: Positive for shortness of breath. Negative for cough and wheezing.   Cardiovascular: Negative for chest pain, palpitations and leg swelling.  Gastrointestinal: Positive for abdominal pain. Negative for nausea, vomiting, diarrhea and constipation.       Feels gassy  Genitourinary: Negative for dysuria.  Musculoskeletal: Negative for myalgias.  Neurological: Negative for dizziness, sensory change, speech change, focal weakness, seizures and headaches.  Psychiatric/Behavioral: Negative for depression.    DRUG ALLERGIES:   Allergies  Allergen Reactions  . Adhesive [Tape] Other (See Comments)    "irritate skin"  . Codeine     REACTION: NAUSEA  . Tetanus Toxoid     Other reaction(s): Unknown REACTION: ARM SWELLING,REDNESS  . Tetanus Toxoids   . Tramadol Nausea And Vomiting    VITALS:  Blood pressure 125/92, pulse 72, temperature 97.5 F (36.4 C), temperature source Oral, resp. rate 18, height '5\' 2"'$  (1.575 m), weight 65.908 kg (145 lb 4.8 oz), SpO2 100 %.  PHYSICAL EXAMINATION:  Physical Exam  GENERAL:  75 y.o.-year-old patient sitting in the chair with no acute distress. Appears globally weak. EYES: Pupils equal, round, reactive to light and accommodation. No scleral icterus. Extraocular muscles intact.  HEENT: Head atraumatic, normocephalic. Oropharynx and  nasopharynx clear.  NECK:  Supple, no jugular venous distention. No thyroid enlargement, no tenderness.  LUNGS: Normal breath sounds bilaterally, no wheezing, rales,rhonchi or crepitation. No use of accessory muscles of respiration. Decreased bibasilar breath sounds Dyspnea on exertion CARDIOVASCULAR: S1, S2 normal. No murmurs, rubs, or gallops.  ABDOMEN: Soft, nontender, nondistended. Bowel sounds present. No organomegaly or mass.  EXTREMITIES: No pedal edema, cyanosis, or clubbing.  NEUROLOGIC: Cranial nerves II through XII are intact. Muscle strength 5/5 in all extremities. Sensation intact. Gait not checked. Global weakness noted. PSYCHIATRIC: The patient is alert and oriented x 2.  SKIN: No obvious rash, lesion, or ulcer.    LABORATORY PANEL:   CBC  Recent Labs Lab 11/01/15 0518  WBC 13.6*  HGB 7.7*  HCT 24.3*  PLT 316   ------------------------------------------------------------------------------------------------------------------  Chemistries   Recent Labs Lab 10/29/15 0458  10/31/15 0357  NA 138  < > 143  K 3.6  < > 3.8  CL 97*  < > 104  CO2 31  < > 36*  GLUCOSE 115*  < > 89  BUN 39*  < > 29*  CREATININE 0.84  < > 0.55  CALCIUM 8.6*  < > 8.3*  MG 2.2  --   --   < > = values in this interval not displayed. ------------------------------------------------------------------------------------------------------------------  Cardiac Enzymes  Recent Labs Lab 11/01/15 1118  TROPONINI 0.10*   ------------------------------------------------------------------------------------------------------------------  RADIOLOGY:  Ct Angio Chest Pe W/cm &/or Wo Cm  10/30/2015  CLINICAL DATA:  75 year old female inpatient with hypoxia. Recent right partial nephrectomy for renal mass. EXAM: CT ANGIOGRAPHY CHEST WITH CONTRAST TECHNIQUE: Multidetector CT  imaging of the chest was performed using the standard protocol during bolus administration of intravenous contrast.  Multiplanar CT image reconstructions and MIPs were obtained to evaluate the vascular anatomy. CONTRAST:  75 cc Isovue 370 IV. COMPARISON:  10/28/2015 chest radiograph. 03/09/2013 chest CT angiogram. FINDINGS: Mediastinum/Nodes: The study is high quality for the evaluation of pulmonary embolism. There are no filling defects in the central, lobar, segmental or subsegmental pulmonary artery branches to suggest acute pulmonary embolism. Great vessels are normal in course and caliber. Normal heart size. Trace pericardial fluid/thickening. Coronary atherosclerosis. Thyroid appears atrophic versus surgically absent. Normal esophagus. No pathologically enlarged axillary, mediastinal or hilar lymph nodes. Lungs/Pleura: No pneumothorax. Layering small right and trace left pleural effusions. Mild-to-moderate compressive atelectasis in the bilateral dependent lower lobes. Mild centrilobular and paraseptal emphysema with diffuse bronchial wall thickening. There is a 1.0 x 0.5 cm subpleural nodule in the anterior apical right upper lobe (series 6/ image 17), new since 03/09/2013. No additional significant pulmonary nodules in the aerated lungs. Mild interlobular septal thickening in both lungs. Upper abdomen: Unremarkable. Musculoskeletal: No aggressive appearing focal osseous lesions. Marked degenerative changes in the thoracic spine. Review of the MIP images confirms the above findings. IMPRESSION: 1. No pulmonary embolism. 2. Small right and trace left pleural effusions with mild to moderate bilateral lower lobe atelectasis. 3. Mild interlobular septal thickening in both lungs, suggesting mild pulmonary edema. 4. Coronary atherosclerosis. Electronically Signed   By: Ilona Sorrel M.D.   On: 10/30/2015 17:39   Dg Chest Port 1 View  10/31/2015  CLINICAL DATA:  Acute onset of tachypnea and upper abdominal pain. Shortness of breath. Initial encounter. EXAM: PORTABLE CHEST 1 VIEW COMPARISON:  Chest radiograph performed  10/28/2015, and CTA of the chest performed 10/30/2015 FINDINGS: The lungs are hypoexpanded. Mild bibasilar atelectasis is noted. No pleural effusion or pneumothorax is seen. The cardiomediastinal silhouette is borderline normal in size. No acute osseous abnormalities are seen. IMPRESSION: Lungs hypoexpanded.  Mild bibasilar atelectasis noted. Electronically Signed   By: Garald Balding M.D.   On: 10/31/2015 22:44    EKG:   Orders placed or performed during the hospital encounter of 10/23/15  . EKG 12-Lead  . EKG 12-Lead  . EKG 12-Lead  . EKG 12-Lead  . EKG 12-Lead  . EKG 12-Lead    ASSESSMENT AND PLAN:   75 year old female with past medical history significant for transfusion dependent anemia, arthritis, hypertension, hypothyroidism admitted with right renal mass and had partial right nephrectomy done. Medical consult requested for hypoxia and A. fib with RVR  #1 Acute respiratory distress with hypoxia-COPD exacerbation and global weakness - ENCOURAGE INCENTIVE SPIROMETRY -on oral prednisone -Continue neb treatments. - echo with normal   Ejection fraction. Likely diastolic dysfunction. --Currently on high flow nasal cannula at 40% flow rate and fio2- destaurations noted- weakness related or from abdominal issues - f/u CXR tomorrow -CT of the chest negative for PE, mild pulmonary vascular congestion noted. Continue daily Lasix orally. - Encourage ambulation and incentive spirometry  #2 right renal mass-status post right partial nephrectomy. Management per urology. - post op ileus- on full liquids, had BMs today,, advance diet as tolerated  #3 paroxysmal atrial fibrillation- off cardizem drip- converted to NSR Rate is well controlled and patient converted to normal sinus rhythm now. -On low-dose metoprolol  #4 hypertension-on losartan and metoprolol.  #5 acute delirium-likely from pain medications. improving. -Decreased dose of steroids. Continue Remeron at bedtime. Continue to  monitor closely.  #6 DVT prophylaxis-on subcutaneous  heparin  #7 Anemia- received 1 unit Tx post op, hb at 7.7 Transfuse if hb<7.5  Disposition to rehab when medically stable   All the records are reviewed and case discussed with Care Management/Social Workerr. Management plans discussed with the patient, family and they are in agreement.  CODE STATUS: Full code  TOTAL TIME TAKING CARE OF THIS PATIENT: 37 minutes.   POSSIBLE D/C IN 2 DAYS, DEPENDING ON CLINICAL CONDITION.   Gladstone Lighter M.D on 11/01/2015 at 4:11 PM  Between 7am to 6pm - Pager - [phone]  After 6pm go to [email or web address] - password [email or web address] [facility]  Office  [phone]  CC: Primary care physician; [name], MD

## 2015-11-01 NOTE — Progress Notes (Signed)
Rapid response called due to pt respirations are staying in the 30-40s, pain has gotten worse and has not subsided, the way the pt is breathing is concerning to RN. Pt O2 stats is 99-100 % on the HFNC. Doctor Jannifer Franklin was notified that rapid response was called, new orders for stat EKG, chest x-ray, may use labetalol if BP is above 160/100, hold the hydralazine 10 mg PO . Current BP at this time is 169/73. Will continue to assess and monitor pt.   Angus Seller

## 2015-11-02 ENCOUNTER — Inpatient Hospital Stay: Payer: PPO

## 2015-11-02 LAB — BASIC METABOLIC PANEL WITH GFR
Anion gap: 7 (ref 5–15)
BUN: 14 mg/dL (ref 6–20)
CO2: 35 mmol/L — ABNORMAL HIGH (ref 22–32)
Calcium: 8.2 mg/dL — ABNORMAL LOW (ref 8.9–10.3)
Chloride: 98 mmol/L — ABNORMAL LOW (ref 101–111)
Creatinine, Ser: 0.52 mg/dL (ref 0.44–1.00)
GFR calc Af Amer: >60 mL/min (ref >60)
GFR calc non Af Amer: >60 mL/min (ref >60)
Glucose, Bld: 83 mg/dL (ref 65–99)
Potassium: 3.3 mmol/L — ABNORMAL LOW (ref 3.5–5.1)
Sodium: 140 mmol/L (ref 135–145)

## 2015-11-02 LAB — TROPONIN I
TROPONIN I: 0.07 ng/mL — AB (ref <0.031)
Troponin I: 0.06 ng/mL — ABNORMAL HIGH (ref <0.031)
Troponin I: 0.07 ng/mL — ABNORMAL HIGH (ref <0.031)
Troponin I: 0.07 ng/mL — ABNORMAL HIGH (ref <0.031)

## 2015-11-02 LAB — CBC
HCT: 24.6 % — ABNORMAL LOW (ref 35.0–47.0)
HEMOGLOBIN: 7.9 g/dL — AB (ref 12.0–16.0)
MCH: 28.6 pg (ref 26.0–34.0)
MCHC: 31.9 g/dL — ABNORMAL LOW (ref 32.0–36.0)
MCV: 89.6 fL (ref 80.0–100.0)
Platelets: 298 10*3/uL (ref 150–440)
RBC: 2.75 MIL/uL — AB (ref 3.80–5.20)
RDW: 16.8 % — ABNORMAL HIGH (ref 11.5–14.5)
WBC: 17.2 10*3/uL — ABNORMAL HIGH (ref 3.6–11.0)

## 2015-11-02 MED ORDER — PREDNISONE 20 MG PO TABS
40.0000 mg | ORAL_TABLET | Freq: Every day | ORAL | Status: AC
  Administered 2015-11-03: 40 mg via ORAL
  Filled 2015-11-02: qty 2

## 2015-11-02 MED ORDER — PREDNISONE 20 MG PO TABS
30.0000 mg | ORAL_TABLET | Freq: Every day | ORAL | Status: AC
  Administered 2015-11-04: 30 mg via ORAL
  Filled 2015-11-02: qty 1

## 2015-11-02 MED ORDER — POTASSIUM CHLORIDE CRYS ER 20 MEQ PO TBCR
40.0000 meq | EXTENDED_RELEASE_TABLET | Freq: Once | ORAL | Status: AC
  Administered 2015-11-02: 40 meq via ORAL
  Filled 2015-11-02: qty 2

## 2015-11-02 MED ORDER — IPRATROPIUM-ALBUTEROL 0.5-2.5 (3) MG/3ML IN SOLN: 3.0000 mL | Freq: Four times a day (QID) | RESPIRATORY_TRACT | Status: DC | PRN

## 2015-11-02 MED ORDER — PREDNISONE 10 MG PO TABS
10.0000 mg | ORAL_TABLET | Freq: Every day | ORAL | Status: AC
  Administered 2015-11-06: 10 mg via ORAL
  Filled 2015-11-02: qty 1

## 2015-11-02 MED ORDER — PREDNISONE 20 MG PO TABS
20.0000 mg | ORAL_TABLET | Freq: Every day | ORAL | Status: AC
  Administered 2015-11-05: 20 mg via ORAL
  Filled 2015-11-02: qty 1

## 2015-11-02 NOTE — Progress Notes (Signed)
10 Days Post-Op Subjective: Quiet night, no further issues.  Doing well on Hiflo [place].   +BM/ flatus.  Nausea resolved.   Refused PT yesterday.    Objective: Vital signs in last 24 hours: Temp:  [97.9 F (36.6 C)-98.4 F (36.9 C)] 98.4 F (36.9 C) (03/30 0932) Pulse Rate:  [75-83] 83 (03/30 0932) Resp:  [20-21] 20 (03/30 0557) BP: (129-185)/(48-75) 129/48 mmHg (03/30 0932) SpO2:  [98 %-100 %] 100 % (03/30 0932) FiO2 (%):  [40 %-45 %] 40 % (03/30 0836)  Intake/Output from previous day: 03/29 0701 - 03/30 0700 In: 480 [P.O.:480] Out: 1325 [Urine:1325] Intake/Output this shift:    Physical Exam:  General: Alert and oriented.  Answering all questions appropriately. CV: No LE edema.   Lungs: No increased work of breathing. Wearing nasal cannula. GI: Soft,minimally distended, nontender.  No rebound or guarding.  + BS. Incisions: Clean and dry.  Urine: Clear, Foley in place draining clear yellow urine.   Extremities: Nontender, no erythema, no edema.  Lab Results:  Recent Labs  10/31/15 0357 11/01/15 0518 11/02/15 0407  HGB 7.7* 7.7* 7.9*  HCT 23.9* 24.3* 24.6*           Recent Labs  10/30/15 0536 10/31/15 0357 11/02/15 0407  CREATININE 0.80 0.55 0.52           Results for orders placed or performed during the hospital encounter of 10/23/15 (from the past 24 hour(s))  Troponin I     Status: Abnormal   Collection Time: 11/01/15  4:50 PM  Result Value Ref Range   Troponin I 0.08 (H) <0.031 ng/mL  Troponin I     Status: Abnormal   Collection Time: 11/01/15 10:57 PM  Result Value Ref Range   Troponin I 0.09 (H) <0.031 ng/mL  Troponin I     Status: Abnormal   Collection Time: 11/02/15  4:07 AM  Result Value Ref Range   Troponin I 0.07 (H) <0.031 ng/mL  CBC     Status: Abnormal   Collection Time: 11/02/15  4:07 AM  Result Value Ref Range   WBC 17.2 (H) 3.6 - 11.0 K/uL   RBC 2.75 (L) 3.80 - 5.20 MIL/uL   Hemoglobin 7.9 (L) 12.0 - 16.0 g/dL   HCT 24.6 (L) 35.0  - 47.0 %   MCV 89.6 80.0 - 100.0 fL   MCH 28.6 26.0 - 34.0 pg   MCHC 31.9 (L) 32.0 - 36.0 g/dL   RDW 16.8 (H) 11.5 - 14.5 %   Platelets 298 150 - 440 K/uL  Basic metabolic panel     Status: Abnormal   Collection Time: 11/02/15  4:07 AM  Result Value Ref Range   Sodium 140 135 - 145 mmol/L   Potassium 3.3 (L) 3.5 - 5.1 mmol/L   Chloride 98 (L) 101 - 111 mmol/L   CO2 35 (H) 22 - 32 mmol/L   Glucose, Bld 83 65 - 99 mg/dL   BUN 14 6 - 20 mg/dL   Creatinine, Ser 0.52 0.44 - 1.00 mg/dL   Calcium 8.2 (L) 8.9 - 10.3 mg/dL   GFR calc non Af Amer >60 >60 mL/min   GFR calc Af Amer >60 >60 mL/min   Anion gap 7 5 - 15  Troponin I     Status: Abnormal   Collection Time: 11/02/15 10:59 AM  Result Value Ref Range   Troponin I 0.06 (H) <0.031 ng/mL    Assessment/Plan: POD# 10 s/p robotic partial nephrectomy.  Pathology consistent with RCC (  papillary clear cell), pT1NxM0, margins negative.    1) acute blood loss anemia-appropriate bump in hematocrit following 1 unit packed red blood cell transfusion.  No concern for ongoing bleeding.  Hemoglobin stable.   On SQH/ VD for ppx.  2) leukocytosis- elevated without fevers/ chills.  Stable.  Will defer ID work up at this time, continue to trend unless spikes fever.  Etiology unclear, persistent since immediate post op.  Trending down overall but slightly up since yesterday.    3) Ileus-  +BM/ flatus, minimal distention.  Soft diet.  Ensure supplementation.  PLEASE AVOID NARCOTICS.    4) urinary retention x 2- maintain foley until ambulating regularly. Discussed with nursing today.  5) CHF/ COPD exacerbation/ hypoxia- per medicine.  CT PA negative.  Echo normal.  Continues to have desats.  6) Afib with RVR- responded to IV dilt gtt. Now in sinus rhythm, resolved.  Medicine following/ managing.   7) Delirium-  Stopped all potential medications which may be contributing including narcotics, antianxiety medication, and Benadryl.  UCx neg.  reorient  patient regularly. Tylenol/ toradol for pain.    8) dispo-  Rehab, not yet appropriate for discharge due to ongoing pulmonary issues/ deconditioning.  Reinforced need for ambulation, OOB to chair.    Hollice Espy, MD   LOS: 10 days   Hollice Espy 11/02/2015, 2:11 PM

## 2015-11-02 NOTE — Clinical Social Work Note (Signed)
Patient continues on high flow oxygen and thus is not appropriate for STR placement at this time.  Shela Leff MSW,LCSW [phone]

## 2015-11-02 NOTE — Progress Notes (Signed)
Spoke with Dr. Erlene Quan and she wants the foley to remain for now and will give orders regarding the foley.

## 2015-11-02 NOTE — Progress Notes (Signed)
Per Nurse [place], foley catheter was changed yesterday on an attempt to see if patient could void, will clarify with nurse when the catheter was changed

## 2015-11-02 NOTE — Progress Notes (Signed)
Physical Therapy Treatment Patient Details Name: Caroline Nichols MRN: [id number] DOB: 1941/08/04 Today's Date: 11/02/2015    History of Present Illness Pt underwent robotic parital R nephrectomy secondary to R renal mass and is POD#2 at time of PT evaluation. RN reports that pt has been up walking a very limited amount earlier today with staff. Pt denies falls in the last 12 months. At baseline she lives in [place] at [facility] and is independent with all ADLs. Pt eats meals at facility.    PT Comments    Pt more alert today and reports feeling a little better. Pt notes she is awaiting to go to the bathroom. Pt demonstrates improved bed mobility requiring only increased time to sit, but Min A sit to supine. Pt requires Min A to stand and take several steps bed to/from bedside commode. Pt requires total assist for toileting. Pt wished return to bed post toileting and does not feel up to further PT at this time. Pt did maintain 100% O2 saturation on 7 liters O2 throughout session. Continue PT to progress strength, endurance to improve all functional mobility.   Follow Up Recommendations  SNF     Equipment Recommendations  None recommended by PT    Recommendations for Other Services       Precautions / Restrictions Precautions Precautions: Fall Restrictions Weight Bearing Restrictions: No    Mobility  Bed Mobility Overal bed mobility: Modified Independent Bed Mobility: Supine to Sit;Sit to Supine     Supine to sit: Modified independent (Device/Increase time) Sit to supine: Min assist   General bed mobility comments: Increased time  Transfers Overall transfer level: Needs assistance Equipment used: Rolling walker (2 wheeled) Transfers: Sit to/from Stand Sit to Stand: Min assist         General transfer comment: Increased time to rise  Ambulation/Gait Ambulation/Gait assistance: Min assist Ambulation Distance (Feet): 3 Feet (bed to bedside commode) Assistive device:  Rolling walker (2 wheeled)   Gait velocity: reduced Gait velocity interpretation: <1.8 ft/sec, indicative of risk for recurrent falls General Gait Details: Several steps bed to/from bedside commode. Weak LEs, slow unsteady steps   Stairs            Wheelchair Mobility    Modified Rankin (Stroke Patients Only)       Balance Overall balance assessment: Needs assistance Sitting-balance support: Bilateral upper extremity supported;Feet supported Sitting balance-Leahy Scale: Fair     Standing balance support: Bilateral upper extremity supported Standing balance-Leahy Scale: Fair                      Cognition Arousal/Alertness: Awake/alert Behavior During Therapy: WFL for tasks assessed/performed Overall Cognitive Status: Within Functional Limits for tasks assessed                      Exercises Other Exercises Other Exercises: Total assist for toileting    General Comments        Pertinent Vitals/Pain Pain Assessment: No/denies pain    Home Living                      Prior Function            PT Goals (current goals can now be found in the care plan section) Progress towards PT goals: Progressing toward goals    Frequency  Min 2X/week    PT Plan Current plan remains appropriate    Co-evaluation  End of Session Equipment Utilized During Treatment: Oxygen Activity Tolerance: Patient tolerated treatment well (O2 saturation levels remained 100% throughout session on 7L) Patient left: in bed;with call bell/phone within reach;with bed alarm set     Time: 8250-0370 PT Time Calculation (min) (ACUTE ONLY): 23 min  Charges:  $Gait Training: 8-22 mins $Therapeutic Activity: 8-22 mins                    G Codes:      Charlaine Dalton, PTA 11/02/2015, 3:30 PM

## 2015-11-02 NOTE — Progress Notes (Signed)
[facility] on standby. Pt on 6L [place] tolerating well.

## 2015-11-02 NOTE — Progress Notes (Signed)
[place] at [place] NAME: Caroline Nichols    MR#:  [id number]  DATE OF BIRTH:  02-17-1941  SUBJECTIVE:  CHIEF COMPLAINT:  No chief complaint on file.  - s/p right renal mass surgery, remains on high flow nasal cannula. -Extremely fatigued and weak even prior to admission. Chest x-ray with low lung volumes. Decreased FiO2 requirements today. -Encouraged to do incentive spirometry  REVIEW OF SYSTEMS:  Review of Systems  Constitutional: Positive for malaise/fatigue. Negative for fever and chills.  HENT: Negative for ear discharge, ear pain and nosebleeds.   Eyes: Negative for blurred vision and double vision.  Respiratory: Positive for shortness of breath. Negative for cough and wheezing.   Cardiovascular: Negative for chest pain, palpitations and leg swelling.  Gastrointestinal: Positive for abdominal pain. Negative for nausea, vomiting, diarrhea and constipation.       Feels gassy  Genitourinary: Negative for dysuria.  Musculoskeletal: Negative for myalgias.  Neurological: Negative for dizziness, sensory change, speech change, focal weakness, seizures and headaches.  Psychiatric/Behavioral: Negative for depression.    DRUG ALLERGIES:   Allergies  Allergen Reactions  . Adhesive [Tape] Other (See Comments)    "irritate skin"  . Codeine     REACTION: NAUSEA  . Tetanus Toxoid     Other reaction(s): Unknown REACTION: ARM SWELLING,REDNESS  . Tetanus Toxoids   . Tramadol Nausea And Vomiting    VITALS:  Blood pressure 129/48, pulse 83, temperature 98.4 F (36.9 C), temperature source Oral, resp. rate 20, height '5\' 2"'$  (1.575 m), weight 65.908 kg (145 lb 4.8 oz), SpO2 100 %.  PHYSICAL EXAMINATION:  Physical Exam  GENERAL:  75 y.o.-year-old patient sitting in the chair with no acute distress. Appears globally weak. EYES: Pupils equal, round, reactive to light and accommodation. No scleral icterus. Extraocular muscles intact.   HEENT: Head atraumatic, normocephalic. Oropharynx and nasopharynx clear.  NECK:  Supple, no jugular venous distention. No thyroid enlargement, no tenderness.  LUNGS: Normal breath sounds bilaterally, no wheezing, rales,rhonchi or crepitation. No use of accessory muscles of respiration. Decreased bibasilar breath sounds Dyspnea on exertion CARDIOVASCULAR: S1, S2 normal. No murmurs, rubs, or gallops.  ABDOMEN: Soft, nontender, nondistended. Bowel sounds present. No organomegaly or mass.  EXTREMITIES: No pedal edema, cyanosis, or clubbing.  NEUROLOGIC: Cranial nerves II through XII are intact. Muscle strength 5/5 in all extremities. Sensation intact. Gait not checked. Global weakness noted. PSYCHIATRIC: The patient is alert and oriented x 2-3.  SKIN: No obvious rash, lesion, or ulcer.    LABORATORY PANEL:   CBC  Recent Labs Lab 11/02/15 0407  WBC 17.2*  HGB 7.9*  HCT 24.6*  PLT 298   ------------------------------------------------------------------------------------------------------------------  Chemistries   Recent Labs Lab 10/29/15 0458  11/02/15 0407  NA 138  < > 140  K 3.6  < > 3.3*  CL 97*  < > 98*  CO2 31  < > 35*  GLUCOSE 115*  < > 83  BUN 39*  < > 14  CREATININE 0.84  < > 0.52  CALCIUM 8.6*  < > 8.2*  MG 2.2  --   --   < > = values in this interval not displayed. ------------------------------------------------------------------------------------------------------------------  Cardiac Enzymes  Recent Labs Lab 11/02/15 1059  TROPONINI 0.06*   ------------------------------------------------------------------------------------------------------------------  RADIOLOGY:  Dg Chest 1 View  11/02/2015  CLINICAL DATA:  Shortness of breath. EXAM: CHEST 1 VIEW COMPARISON:  10/31/2015. FINDINGS: Mediastinum and hilar structures normal. Low lung volumes with mild bibasilar  atelectasis. No pleural effusion or pneumothorax. Cardiomegaly with normal pulmonary  vascularity. No acute bony abnormality. IMPRESSION: Low lung volumes with mild bibasilar atelectasis and/or infiltrates. No pleural effusion or pneumothorax. Electronically Signed   By: Marcello Moores  Register   On: 11/02/2015 09:05   Dg Chest Port 1 View  10/31/2015  CLINICAL DATA:  Acute onset of tachypnea and upper abdominal pain. Shortness of breath. Initial encounter. EXAM: PORTABLE CHEST 1 VIEW COMPARISON:  Chest radiograph performed 10/28/2015, and CTA of the chest performed 10/30/2015 FINDINGS: The lungs are hypoexpanded. Mild bibasilar atelectasis is noted. No pleural effusion or pneumothorax is seen. The cardiomediastinal silhouette is borderline normal in size. No acute osseous abnormalities are seen. IMPRESSION: Lungs hypoexpanded.  Mild bibasilar atelectasis noted. Electronically Signed   By: Garald Balding M.D.   On: 10/31/2015 22:44    EKG:   Orders placed or performed during the hospital encounter of 10/23/15  . EKG 12-Lead  . EKG 12-Lead  . EKG 12-Lead  . EKG 12-Lead  . EKG 12-Lead  . EKG 12-Lead    ASSESSMENT AND PLAN:   75 year old female with past medical history significant for transfusion dependent anemia, arthritis, hypertension, hypothyroidism admitted with right renal mass and had partial right nephrectomy done. Medical consult requested for hypoxia and A. fib with RVR  #1 Acute respiratory distress with hypoxia-COPD exacerbation and global weakness- POOR INSPIRATORY EFFORT - ENCOURAGE INCENTIVE SPIROMETRY -on oral prednisone- taper -Continue neb treatments. - echo with normal Ejection fraction. Likely diastolic dysfunction. --Currently on high flow nasal cannula at 40% flow rate and 35% fio2- wean to nasal cannula today for possible-  -Chest x-ray with low lung volumes, no pneumonia or pulmonary edema noted. -CT of the chest negative for PE, mild pulmonary vascular congestion noted. Continue daily Lasix orally. - Encourage ambulation and incentive spirometry  #2  right renal mass-status post right partial nephrectomy. Pathology consistent with RCC (papillary clear cell), pT1NxM0, margins negative -Management per urology. - post op ileus-  had BMs,, advance diet as tolerated- on solids today  #3 paroxysmal atrial fibrillation- off cardizem drip- converted to NSR Rate is well controlled and patient converted to normal sinus rhythm now. -On low-dose metoprolol  #4 hypertension-on losartan and metoprolol.  #5 acute delirium-likely from pain medications. Resolved. -Taper off steroids. Continue Remeron at bedtime. Continue to monitor closely.  #6 DVT prophylaxis-on subcutaneous heparin  #7 Anemia- received 1 unit Tx post op, hb at 7.9 Transfuse if hb<7.5  Disposition to rehab when medically stable-once able to be weaned off high flow nasal cannula   All the records are reviewed and case discussed with Care Management/Social Workerr. Management plans discussed with the patient, family and they are in agreement.  CODE STATUS: Full code  TOTAL TIME TAKING CARE OF THIS PATIENT: 37 minutes.   POSSIBLE D/C IN 2 DAYS, DEPENDING ON CLINICAL CONDITION.   Kaysi Ourada M.D on 11/02/2015 at 2:16 PM  Between 7am to 6pm - Pager - [phone]  After 6pm go to [email or web address] - password [email or web address] [facility]  Office  [phone]  CC: Primary care physician; [name], MD

## 2015-11-03 LAB — BASIC METABOLIC PANEL
ANION GAP: 3 — AB (ref 5–15)
BUN: 13 mg/dL (ref 6–20)
CO2: 36 mmol/L — AB (ref 22–32)
Calcium: 8.1 mg/dL — ABNORMAL LOW (ref 8.9–10.3)
Chloride: 102 mmol/L (ref 101–111)
Creatinine, Ser: 0.48 mg/dL (ref 0.44–1.00)
GFR calc Af Amer: >60 mL/min (ref >60)
GFR calc non Af Amer: >60 mL/min (ref >60)
GLUCOSE: 82 mg/dL (ref 65–99)
POTASSIUM: 3.8 mmol/L (ref 3.5–5.1)
Sodium: 141 mmol/L (ref 135–145)

## 2015-11-03 LAB — TROPONIN I
TROPONIN I: 0.05 ng/mL — AB (ref <0.031)
Troponin I: 0.08 ng/mL — ABNORMAL HIGH (ref <0.031)
Troponin I: 0.06 ng/mL — ABNORMAL HIGH (ref <0.031)
Troponin I: 0.06 ng/mL — ABNORMAL HIGH (ref <0.031)

## 2015-11-03 NOTE — Clinical Social Work Note (Signed)
Also, CSW contacted Amy at [facility] so that [place] could be started and Amy stated that she will work on it because she knew patient would need STR. Shela Leff MSW,LCSW [phone]

## 2015-11-03 NOTE — Clinical Social Work Note (Signed)
Patient is doing better today and CSW went to extend bed offers. Patient asked that I call her daughter: Caroline Nichols in order to extend the offers and wanted her to make the decision. CSW spoke with [facility] and extended the bed offers. Caroline Nichols has accepted [facility] and CSW has accepted this bed offer via epic and texted Financial trader at [facility]. Shela Leff MSW,LCSW [phone]

## 2015-11-03 NOTE — Care Management Important Message (Signed)
Important Message  Patient Details  Name: RAGAN DUHON MRN: [id number] Date of Birth: [birth year]   Medicare Important Message Given:  Yes    Beverly Sessions, RN 11/03/2015, 1:46 PM

## 2015-11-03 NOTE — Progress Notes (Signed)
11 Days Post-Op Subjective: Doing well today. On bedside commode during exam. Having bowel movements. Tolerating diet.    Objective: Vital signs in last 24 hours: Temp:  [98.4 F (36.9 C)-98.9 F (37.2 C)] 98.9 F (37.2 C) (03/31 0947) Pulse Rate:  [67-82] 74 (03/31 1147) Resp:  [17-22] 22 (03/31 1147) BP: (107-167)/(46-88) 107/88 mmHg (03/31 1147) SpO2:  [95 %-100 %] 100 % (03/31 1147)  Intake/Output from previous day: 03/30 0701 - 03/31 0700 In: 118 [P.O.:118] Out: 1250 [Urine:1250] Intake/Output this shift:    Physical Exam:  General: Alert and oriented.  Answering all questions appropriately. CV: No LE edema.   Lungs: No increased work of breathing. Wearing nasal cannula. GI: Soft,minimally distended, nontender.  No rebound or guarding.  + BS. Incisions: Clean and dry.  Urine: Clear, Foley in place draining clear yellow urine.   Extremities: Nontender, no erythema, no edema.  Lab Results:  Recent Labs  11/01/15 0518 11/02/15 0407  HGB 7.7* 7.9*  HCT 24.3* 24.6*           Recent Labs  10/31/15 0357 11/02/15 0407 11/03/15 0404  CREATININE 0.55 0.52 0.48           Results for orders placed or performed during the hospital encounter of 10/23/15 (from the past 24 hour(s))  Troponin I     Status: Abnormal   Collection Time: 11/02/15  5:05 PM  Result Value Ref Range   Troponin I 0.07 (H) <0.031 ng/mL  Troponin I     Status: Abnormal   Collection Time: 11/02/15 11:11 PM  Result Value Ref Range   Troponin I 0.07 (H) <0.031 ng/mL  Troponin I     Status: Abnormal   Collection Time: 11/03/15  4:04 AM  Result Value Ref Range   Troponin I 0.08 (H) <0.031 ng/mL  Basic metabolic panel     Status: Abnormal   Collection Time: 11/03/15  4:04 AM  Result Value Ref Range   Sodium 141 135 - 145 mmol/L   Potassium 3.8 3.5 - 5.1 mmol/L   Chloride 102 101 - 111 mmol/L   CO2 36 (H) 22 - 32 mmol/L   Glucose, Bld 82 65 - 99 mg/dL   BUN 13 6 - 20 mg/dL   Creatinine, Ser  0.48 0.44 - 1.00 mg/dL   Calcium 8.1 (L) 8.9 - 10.3 mg/dL   GFR calc non Af Amer >60 >60 mL/min   GFR calc Af Amer >60 >60 mL/min   Anion gap 3 (L) 5 - 15  Troponin I     Status: Abnormal   Collection Time: 11/03/15 10:52 AM  Result Value Ref Range   Troponin I 0.06 (H) <0.031 ng/mL    Assessment/Plan: POD# 11 s/p robotic partial nephrectomy.  Pathology consistent with RCC (papillary clear cell), pT1NxM0, margins negative.    1) acute blood loss anemia-appropriate bump in hematocrit following 1 unit packed red blood cell transfusion.  No concern for ongoing bleeding.  Hemoglobin stable.   On SQH/ VD for ppx.  2) leukocytosis- elevated without fevers/ chills.  Stable.  Will defer ID work up at this time, continue to trend unless spikes fever.  Etiology unclear, persistent since immediate post op.  Trending down overall.  3) Ileus-  +BM/ flatus, minimal distention.  Soft diet.  Ensure supplementation.  PLEASE AVOID NARCOTICS.    4) urinary retention x 2- voiding trial today.  5) CHF/ COPD exacerbation/ hypoxia- per medicine.  CT PA negative.  Echo normal.  Weaned to  6 L nasal cannula, satting well.  6) Afib with RVR- responded to IV dilt gtt. Now in sinus rhythm, resolved.  Medicine following/ managing.   7) Delirium-  Stopped all potential medications which may be contributing including narcotics, antianxiety medication, and Benadryl.  UCx neg.  reorient patient regularly. Tylenol/ toradol for pain.    8) dispo-  Rehab, not yet appropriate for discharge due to ongoing pulmonary issues/ deconditioning.  Reinforced need for ambulation, OOB to chair.    Hollice Espy, MD   LOS: 11 days   Hollice Espy 11/03/2015, 1:41 PM

## 2015-11-03 NOTE — Consult Note (Signed)
   [facility] Inpatient Consult   11/03/2015  HAELEY FORDHAM 29-Aug-1940 [id number]   Patient screened for potential [place] Management services. Patient is eligible for [place]. Epic reveals patient's discharge plan is SNF. [facility] Care Management services not appropriate at this time. If patient's post hospital needs change please place a [facility] Care Management consult. For questions please contact:   Brena Windsor RN, [facility] Liaison  [phone]) Business Mobile [phone]) Toll free office

## 2015-11-04 ENCOUNTER — Inpatient Hospital Stay: Payer: PPO

## 2015-11-04 LAB — C DIFFICILE QUICK SCREEN W PCR REFLEX
C Diff antigen: POSITIVE — AB
C Diff toxin: NEGATIVE

## 2015-11-04 LAB — CBC
HCT: 24.3 % — ABNORMAL LOW (ref 35.0–47.0)
Hemoglobin: 7.9 g/dL — ABNORMAL LOW (ref 12.0–16.0)
MCH: 29.4 pg (ref 26.0–34.0)
MCHC: 32.4 g/dL (ref 32.0–36.0)
MCV: 90.6 fL (ref 80.0–100.0)
Platelets: 322 10*3/uL (ref 150–440)
RBC: 2.68 MIL/uL — ABNORMAL LOW (ref 3.80–5.20)
RDW: 16.9 % — AB (ref 11.5–14.5)
WBC: 24.3 10*3/uL — ABNORMAL HIGH (ref 3.6–11.0)

## 2015-11-04 LAB — TROPONIN I
TROPONIN I: 0.07 ng/mL — AB (ref <0.031)
TROPONIN I: 0.06 ng/mL — AB (ref <0.031)
TROPONIN I: 0.05 ng/mL — AB (ref <0.031)

## 2015-11-04 LAB — CLOSTRIDIUM DIFFICILE BY PCR: Toxigenic C. Difficile by PCR: NEGATIVE

## 2015-11-04 LAB — GLUCOSE, CAPILLARY: Glucose-Capillary: 92 mg/dL (ref 65–99)

## 2015-11-04 MED ORDER — PREDNISONE 20 MG PO TABS: 20.0000 mg | ORAL_TABLET | Freq: Every day | ORAL | Status: DC

## 2015-11-04 MED ORDER — ALPRAZOLAM 0.25 MG PO TABS: 0.2500 mg | ORAL_TABLET | Freq: Three times a day (TID) | ORAL | Status: DC | PRN

## 2015-11-04 MED ORDER — METOPROLOL TARTRATE 25 MG PO TABS
25.0000 mg | ORAL_TABLET | Freq: Two times a day (BID) | ORAL | Status: DC
Start: 2015-11-04 — End: 2015-11-06
  Administered 2015-11-04 – 2015-11-06 (×4): 25 mg via ORAL
  Filled 2015-11-04 (×4): qty 1

## 2015-11-04 MED ORDER — IPRATROPIUM-ALBUTEROL 0.5-2.5 (3) MG/3ML IN SOLN: 3.0000 mL | Freq: Four times a day (QID) | RESPIRATORY_TRACT | Status: DC | PRN

## 2015-11-04 MED ORDER — ALPRAZOLAM 0.25 MG PO TABS
0.2500 mg | ORAL_TABLET | Freq: Three times a day (TID) | ORAL | Status: DC | PRN
  Administered 2015-11-04 – 2015-11-06 (×4): 0.25 mg via ORAL
  Filled 2015-11-04 (×4): qty 1

## 2015-11-04 MED ORDER — PREDNISONE 10 MG PO TABS: 10.0000 mg | ORAL_TABLET | Freq: Every day | ORAL | Status: DC

## 2015-11-04 MED ORDER — FUROSEMIDE 20 MG PO TABS: 20.0000 mg | ORAL_TABLET | Freq: Every day | ORAL | Status: DC

## 2015-11-04 MED ORDER — ALUM & MAG HYDROXIDE-SIMETH 200-200-20 MG/5ML PO SUSP
30.0000 mL | Freq: Four times a day (QID) | ORAL | Status: DC | PRN
  Administered 2015-11-04 – 2015-11-06 (×4): 30 mL via ORAL
  Filled 2015-11-04 (×4): qty 30

## 2015-11-04 MED ORDER — METOPROLOL TARTRATE 25 MG PO TABS: 25.0000 mg | ORAL_TABLET | Freq: Two times a day (BID) | ORAL | Status: DC

## 2015-11-04 NOTE — Progress Notes (Signed)
Nutrition Follow-up     INTERVENTION:  -Will add yogurt snack BID  -Continue ensure enlive for added nutrition BID -Monitor intake and cater to pt preferences   NUTRITION DIAGNOSIS:   Inadequate oral intake related to altered GI function as evidenced by  (limited intake NPO/CL day 7 of admission).  Diet progressed to soft on 3/30  GOAL:   Patient will meet greater than or equal to 90% of their needs  Progressing but not meeting needs at this time  MONITOR:   PO intake, Diet advancement  REASON FOR ASSESSMENT:   LOS    ASSESSMENT:      Pt diet progressed to soft on 3/30.  Pt reports ate egg whites and grits this am, drinking ensure enlive and protein shake dtr brought her last night.  Reports ate few bites of solid food for dinner last night.  Requesting snack between meals of yogurt and will sent  Medications reviewed: senna, remeron, colace Labs reviewed   Unable to complete Nutrition-Focused physical exam at this time.   Diet Order:  DIET SOFT Room service appropriate?: Yes; Fluid consistency:: Thin  Skin:  Reviewed, no issues  Last BM:  11/04/2015  Height:   Ht Readings from Last 1 Encounters:  10/23/15 '5\' 2"'$  (1.575 m)    Weight:   Wt Readings from Last 1 Encounters:  10/29/15 145 lb 4.8 oz (65.908 kg)    Ideal Body Weight:     BMI:  Body mass index is 26.57 kg/(m^2).  Estimated Nutritional Needs:   Kcal:  BEE 1113 kcals (IF 1.0-1.2, AF 1.3) 1446-1736 kcals/d.   Protein:  (1.0-1.2 g/kg)66-79 g/d  Fluid:  (30-48m/kg) 1980-23161md  EDUCATION NEEDS:   No education needs identified at this time  Kaydense Rizo B. AlZenia ResidesRDWardsvilleLDBarnespager) Weekend/On-Call pager [phone]

## 2015-11-04 NOTE — Progress Notes (Signed)
[facility] [place] NAME: Caroline Nichols    MR#:  [id number]  DATE OF BIRTH:  1940-09-04  SUBJECTIVE:  CHIEF COMPLAINT:  No chief complaint on file.  - s/p right renal mass surgery, was on high flow nasal cannula. - Extremely fatigued and weak even prior to admission. Chest x-ray with low lung volumes. Decreased FiO2 requirements     Today. Chest Xray looks clear. Some anxiety. - Encouraged to do incentive spirometry, need to go to rehab due to weakness.  REVIEW OF SYSTEMS:  Review of Systems  Constitutional: Positive for malaise/fatigue. Negative for fever and chills.  HENT: Negative for ear discharge, ear pain and nosebleeds.   Eyes: Negative for blurred vision and double vision.  Respiratory: Positive for shortness of breath. Negative for cough and wheezing.   Cardiovascular: Negative for chest pain, palpitations and leg swelling.  Gastrointestinal: Positive for abdominal pain. Negative for nausea, vomiting, diarrhea and constipation.       Feels gassy  Genitourinary: Negative for dysuria.  Musculoskeletal: Negative for myalgias.  Neurological: Negative for dizziness, sensory change, speech change, focal weakness, seizures and headaches.  Psychiatric/Behavioral: Negative for depression.    DRUG ALLERGIES:   Allergies  Allergen Reactions  . Adhesive [Tape] Other (See Comments)    "irritate skin"  . Codeine     REACTION: NAUSEA  . Tetanus Toxoid     Other reaction(s): Unknown REACTION: ARM SWELLING,REDNESS  . Tetanus Toxoids   . Tramadol Nausea And Vomiting    VITALS:  Blood pressure 174/69, pulse 79, temperature 98.3 F (36.8 C), temperature source Oral, resp. rate 20, height '5\' 2"'$  (1.575 m), weight 65.908 kg (145 lb 4.8 oz), SpO2 100 %.  PHYSICAL EXAMINATION:  Physical Exam  GENERAL:  75 y.o.-year-old patient sitting in the chair with no acute distress. Appears globally weak. EYES: Pupils equal, round, reactive to  light and accommodation. No scleral icterus. Extraocular muscles intact.  HEENT: Head atraumatic, normocephalic. Oropharynx and nasopharynx clear.  NECK:  Supple, no jugular venous distention. No thyroid enlargement, no tenderness.  LUNGS: Normal breath sounds bilaterally, no wheezing, rales,rhonchi or crepitation. No use of accessory muscles of respiration. Decreased bibasilar breath sounds Dyspnea on exertion CARDIOVASCULAR: S1, S2 normal. No murmurs, rubs, or gallops.  ABDOMEN: Soft, nontender, nondistended. Bowel sounds present. No organomegaly or mass.  EXTREMITIES: No pedal edema, cyanosis, or clubbing.  NEUROLOGIC: Cranial nerves II through XII are intact. Muscle strength 5/5 in all extremities. Sensation intact. Gait not checked. Global weakness noted. PSYCHIATRIC: The patient is alert and oriented x 2-3.  SKIN: No obvious rash, lesion, or ulcer.    LABORATORY PANEL:   CBC  Recent Labs Lab 11/04/15 0513  WBC 24.3*  HGB 7.9*  HCT 24.3*  PLT 322   ------------------------------------------------------------------------------------------------------------------  Chemistries   Recent Labs Lab 10/29/15 0458  11/03/15 0404  NA 138  < > 141  K 3.6  < > 3.8  CL 97*  < > 102  CO2 31  < > 36*  GLUCOSE 115*  < > 82  BUN 39*  < > 13  CREATININE 0.84  < > 0.48  CALCIUM 8.6*  < > 8.1*  MG 2.2  --   --   < > = values in this interval not displayed. ------------------------------------------------------------------------------------------------------------------  Cardiac Enzymes  Recent Labs Lab 11/04/15 0513  TROPONINI 0.07*   ------------------------------------------------------------------------------------------------------------------  RADIOLOGY:  No results found.  EKG:   Orders placed or performed during  the hospital encounter of 10/23/15  . EKG 12-Lead  . EKG 12-Lead  . EKG 12-Lead  . EKG 12-Lead  . EKG 12-Lead  . EKG 12-Lead    ASSESSMENT AND  PLAN:   75 year old female with past medical history significant for transfusion dependent anemia, arthritis, hypertension, hypothyroidism admitted with right renal mass and had partial right nephrectomy done. Medical consult requested for hypoxia and A. fib with RVR  #1 Acute respiratory distress with hypoxia-COPD exacerbation and global weakness- POOR INSPIRATORY EFFORT - ENCOURAGE INCENTIVE SPIROMETRY -on oral prednisone- taper -Continue neb treatments. - echo with normal Ejection fraction. Likely diastolic dysfunction. - was on high flow nasal cannula at 40% flow rate and 35% fio2- wean to nasal cannula   -Chest x-ray with low lung volumes, no pneumonia or pulmonary edema noted. -CT of the chest negative for PE, mild pulmonary vascular congestion noted. Continue daily Lasix orally. - Encourage ambulation and incentive spirometry - try to taper oxygen as SPO2 is 100% - She can go to rehab on 2 ltr nasal canula oxygen as getting better, Continue to taper oral steroids and stop after 2 days.  #2 right renal mass-status post right partial nephrectomy. Pathology consistent with RCC (papillary clear cell), pT1NxM0, margins negative -Management per urology. - post op ileus-  had BMs,, advance diet as tolerated- on solids today  #3 paroxysmal atrial fibrillation- off cardizem drip- converted to NSR Rate is well controlled and patient converted to normal sinus rhythm now. -On low-dose metoprolol  #4 hypertension-on losartan and metoprolol.   Increased metoprolol as BP was high.  #5 acute delirium-likely from pain medications. Resolved. -Taper off steroids. Continue Remeron at bedtime. Continue to monitor closely.  some anxiety expected with oral steroids. - give xanax PRN.  #6 DVT prophylaxis-on subcutaneous heparin  #7 Anemia- received 1 unit Tx post op, hb at 7.9 Transfuse if hb<7.5  Disposition to rehab. Pt can go to rehab anytime now, medically stable.   She will need to be on  tapering steroids- which is to stop in next 2 days.   Advised to continue 2 ltr oxygen at rehab.   and continue duoneb nebulizer 3 ml every 6 hrs.  I will sign off, please call for any changes.  All the records are reviewed and case discussed with Care Management/Social Workerr. Management plans discussed with the patient, family and they are in agreement.  CODE STATUS: Full code  TOTAL TIME TAKING CARE OF THIS PATIENT: 30 minutes.   POSSIBLE D/C IN 2 DAYS, DEPENDING ON CLINICAL CONDITION.   Vaughan Basta M.D on 11/04/2015 at 11:56 AM  Between 7am to 6pm - Pager - [phone]  After 6pm go to [email or web address] - password [email or web address] [facility]  Office  [phone]  CC: Primary care physician; [name], MD

## 2015-11-04 NOTE — Progress Notes (Signed)
12 Days Post-Op  Subjective:  1 - Renal Cell Carcinoma - s/p RIGHT robotic partial nephrectomy 10/23/15 on day of admission for pT1aNxMx Grade 2 clear cell cancer with negative margins. JP Cr same as serum 3/27 and removed.   2 - Acute Blood Loss Anemia - Hgb drop post-op, received transfusion x1 and Hgb stable since around 8.   3 - Leukocytosis - persistant leukocytosis after surgery 3/20. No fevers. CT chest without PE 3/27, but some atelectasis noted. On IV cephalosporin 3/20-3/27.   4 - Ileus - clinical ileus following surgery 3/20. Resolved clinically with bowel regimen by 3/29 and tollerating diet.   5 - Disposition / Rehab - PT eval recs SNF at discharge due to functional deconditioning and pt amenable.   Today " Caroline Nichols" is stable. Numerous BM's past 24 hrs, though less today. Hgb stable w/o hypotension / tachycardia. WBC persistant high w/o fevers. She has no complaints.    Objective: Vital signs in last 24 hours: Temp:  [98.3 F (36.8 C)-98.9 F (37.2 C)] 98.3 F (36.8 C) (04/01 0402) Pulse Rate:  [74-83] 79 (04/01 0402) Resp:  [18-22] 20 (04/01 0402) BP: (107-161)/(56-88) 150/61 mmHg (04/01 0402) SpO2:  [95 %-100 %] 100 % (04/01 0822) Last BM Date: 11/03/15  Intake/Output from previous day: 03/31 0701 - 04/01 0700 In: 90 [P.O.:90] Out: 851 [Urine:850; Stool:1] Intake/Output this shift:    General appearance: alert, cooperative, appears stated age and eldery but extremely pleasant and AOx3 Eyes: negative Nose: Nares normal. Septum midline. Mucosa normal. No drainage or sinus tenderness. Throat: lips, mucosa, and tongue normal; teeth and gums normal Neck: supple, symmetrical, trachea midline Back: symmetric, no curvature. ROM normal. No CVA tenderness. Resp: non-labored on NCO2 Cardio: irregular rythem with nromal rate at present.  GI: soft, non-tender; bowel sounds normal; no masses,  no organomegaly Extremities: extremities normal, atraumatic, no cyanosis or edema  and some venous stasis changes bialt LE w/o edema.  Lymph nodes: Cervical, supraclavicular, and axillary nodes normal. Incision/Wound: recent port sties c/d/i. No hernias / erythema.   Lab Results:   Recent Labs  11/02/15 0407 11/04/15 0513  WBC 17.2* 24.3*  HGB 7.9* 7.9*  HCT 24.6* 24.3*  PLT 298 322   BMET  Recent Labs  11/02/15 0407 11/03/15 0404  NA 140 141  K 3.3* 3.8  CL 98* 102  CO2 35* 36*  GLUCOSE 83 82  BUN 14 13  CREATININE 0.52 0.48  CALCIUM 8.2* 8.1*   PT/INR No results for input(s): LABPROT, INR in the last 72 hours. ABG No results for input(s): PHART, HCO3 in the last 72 hours.  Invalid input(s): PCO2, PO2  Studies/Results: Dg Chest 1 View  11/02/2015  CLINICAL DATA:  Shortness of breath. EXAM: CHEST 1 VIEW COMPARISON:  10/31/2015. FINDINGS: Mediastinum and hilar structures normal. Low lung volumes with mild bibasilar atelectasis. No pleural effusion or pneumothorax. Cardiomegaly with normal pulmonary vascularity. No acute bony abnormality. IMPRESSION: Low lung volumes with mild bibasilar atelectasis and/or infiltrates. No pleural effusion or pneumothorax. Electronically Signed   By: Marcello Moores  Register   On: 11/02/2015 09:05    Anti-infectives: Anti-infectives    Start     Dose/Rate Route Frequency Ordered Stop   10/30/15 1500  ceFAZolin (ANCEF) IVPB 1 g/50 mL premix  Status:  Discontinued     1 g 100 mL/hr over 30 Minutes Intravenous 3 times per day 10/30/15 1449 10/31/15 0823   10/27/15 1400  ceFAZolin (ANCEF) IVPB 1 g/50 mL premix  Status:  Discontinued     1 g 100 mL/hr over 30 Minutes Intravenous 3 times per day 10/27/15 1027 10/30/15 1439   10/23/15 1900  ceFAZolin (ANCEF) IVPB 1 g/50 mL premix     1 g 100 mL/hr over 30 Minutes Intravenous Every 8 hours 10/23/15 1802 10/24/15 0412   10/23/15 1130  hydroxychloroquine (PLAQUENIL) tablet 400 mg     400 mg Oral Daily 10/23/15 1118     10/23/15 1123  ceFAZolin (ANCEF) 2-3 GM-% IVPB SOLR     Comments:  Rexanne Mano: cabinet override      10/23/15 1123 10/23/15 1245   10/23/15 1030  ceFAZolin (ANCEF) IVPB 2 g/50 mL premix  Status:  Discontinued     2 g 100 mL/hr over 30 Minutes Intravenous  Once 10/23/15 1023 10/23/15 1118      Assessment/Plan:  1 - Renal Cell Carcinoma - excellent prognosis following margin-negative stage 1 disease. No further cancer-directed therapy at this point.   2 - Acute Blood Loss Anemia - Hgb low but stable, low threshold for additional transfusion if hypotention / tachycardia / orthostasis.   3 - Leukocytosis - likely reactive / stress response. Check C Diff today as some copious BM's and has been on ABX.    4 - Ileus - resolved clinicaly.  5 - Disposition - remain in house, DC to SNF possibly next week. Appreciate case management / social work.    [facility], Rosemary Pentecost 11/04/2015

## 2015-11-04 NOTE — Progress Notes (Signed)
[facility] at [place] NAME: Caroline Nichols    MR#:  [id number]  DATE OF BIRTH:  12/28/1940  SUBJECTIVE:  CHIEF COMPLAINT:  No chief complaint on file.  - s/p right renal mass surgery, was on high flow nasal cannula. -Extremely fatigued and weak even prior to admission. Chest x-ray with low lung volumes. Decreased FiO2 requirements today. -Encouraged to do incentive spirometry, need to go to rehab due to weakness.  REVIEW OF SYSTEMS:  Review of Systems  Constitutional: Positive for malaise/fatigue. Negative for fever and chills.  HENT: Negative for ear discharge, ear pain and nosebleeds.   Eyes: Negative for blurred vision and double vision.  Respiratory: Positive for shortness of breath. Negative for cough and wheezing.   Cardiovascular: Negative for chest pain, palpitations and leg swelling.  Gastrointestinal: Positive for abdominal pain. Negative for nausea, vomiting, diarrhea and constipation.       Feels gassy  Genitourinary: Negative for dysuria.  Musculoskeletal: Negative for myalgias.  Neurological: Negative for dizziness, sensory change, speech change, focal weakness, seizures and headaches.  Psychiatric/Behavioral: Negative for depression.    DRUG ALLERGIES:   Allergies  Allergen Reactions  . Adhesive [Tape] Other (See Comments)    "irritate skin"  . Codeine     REACTION: NAUSEA  . Tetanus Toxoid     Other reaction(s): Unknown REACTION: ARM SWELLING,REDNESS  . Tetanus Toxoids   . Tramadol Nausea And Vomiting    VITALS:  Blood pressure 174/69, pulse 79, temperature 98.3 F (36.8 C), temperature source Oral, resp. rate 20, height '5\' 2"'$  (1.575 m), weight 65.908 kg (145 lb 4.8 oz), SpO2 100 %.  PHYSICAL EXAMINATION:  Physical Exam  GENERAL:  75 y.o.-year-old patient sitting in the chair with no acute distress. Appears globally weak. EYES: Pupils equal, round, reactive to light and accommodation. No scleral icterus.  Extraocular muscles intact.  HEENT: Head atraumatic, normocephalic. Oropharynx and nasopharynx clear.  NECK:  Supple, no jugular venous distention. No thyroid enlargement, no tenderness.  LUNGS: Normal breath sounds bilaterally, no wheezing, rales,rhonchi or crepitation. No use of accessory muscles of respiration. Decreased bibasilar breath sounds Dyspnea on exertion CARDIOVASCULAR: S1, S2 normal. No murmurs, rubs, or gallops.  ABDOMEN: Soft, nontender, nondistended. Bowel sounds present. No organomegaly or mass.  EXTREMITIES: No pedal edema, cyanosis, or clubbing.  NEUROLOGIC: Cranial nerves II through XII are intact. Muscle strength 5/5 in all extremities. Sensation intact. Gait not checked. Global weakness noted. PSYCHIATRIC: The patient is alert and oriented x 2-3.  SKIN: No obvious rash, lesion, or ulcer.    LABORATORY PANEL:   CBC  Recent Labs Lab 11/04/15 0513  WBC 24.3*  HGB 7.9*  HCT 24.3*  PLT 322   ------------------------------------------------------------------------------------------------------------------  Chemistries   Recent Labs Lab 10/29/15 0458  11/03/15 0404  NA 138  < > 141  K 3.6  < > 3.8  CL 97*  < > 102  CO2 31  < > 36*  GLUCOSE 115*  < > 82  BUN 39*  < > 13  CREATININE 0.84  < > 0.48  CALCIUM 8.6*  < > 8.1*  MG 2.2  --   --   < > = values in this interval not displayed. ------------------------------------------------------------------------------------------------------------------  Cardiac Enzymes  Recent Labs Lab 11/04/15 0513  TROPONINI 0.07*   ------------------------------------------------------------------------------------------------------------------  RADIOLOGY:  No results found.  EKG:   Orders placed or performed during the hospital encounter of 10/23/15  . EKG 12-Lead  . EKG  12-Lead  . EKG 12-Lead  . EKG 12-Lead  . EKG 12-Lead  . EKG 12-Lead    ASSESSMENT AND PLAN:   75 year old female with past medical  history significant for transfusion dependent anemia, arthritis, hypertension, hypothyroidism admitted with right renal mass and had partial right nephrectomy done. Medical consult requested for hypoxia and A. fib with RVR  #1 Acute respiratory distress with hypoxia-COPD exacerbation and global weakness- POOR INSPIRATORY EFFORT - ENCOURAGE INCENTIVE SPIROMETRY -on oral prednisone- taper -Continue neb treatments. - echo with normal Ejection fraction. Likely diastolic dysfunction. - was on high flow nasal cannula at 40% flow rate and 35% fio2- wean to nasal cannula   -Chest x-ray with low lung volumes, no pneumonia or pulmonary edema noted. -CT of the chest negative for PE, mild pulmonary vascular congestion noted. Continue daily Lasix orally. - Encourage ambulation and incentive spirometry - try to taper oxygen as SPO2 is 100%  #2 right renal mass-status post right partial nephrectomy. Pathology consistent with RCC (papillary clear cell), pT1NxM0, margins negative -Management per urology. - post op ileus-  had BMs,, advance diet as tolerated- on solids today  #3 paroxysmal atrial fibrillation- off cardizem drip- converted to NSR Rate is well controlled and patient converted to normal sinus rhythm now. -On low-dose metoprolol  #4 hypertension-on losartan and metoprolol.  #5 acute delirium-likely from pain medications. Resolved. -Taper off steroids. Continue Remeron at bedtime. Continue to monitor closely.  some anxiety expected with oral steroids.  #6 DVT prophylaxis-on subcutaneous heparin  #7 Anemia- received 1 unit Tx post op, hb at 7.9 Transfuse if hb<7.5  Disposition to rehab likely tomorrow- if able to come down to 2-3 ltr of oxygen.   All the records are reviewed and case discussed with Care Management/Social Workerr. Management plans discussed with the patient, family and they are in agreement.  CODE STATUS: Full code  TOTAL TIME TAKING CARE OF THIS PATIENT: 37 minutes.    POSSIBLE D/C IN 2 DAYS, DEPENDING ON CLINICAL CONDITION.   Vaughan Basta M.D on 11/04/2015 at 9:16 AM  Between 7am to 6pm - Pager - [phone]  After 6pm go to [email or web address] - password [email or web address] [facility]  Office  [phone]  CC: Primary care physician; [name], MD

## 2015-11-05 LAB — TROPONIN I
TROPONIN I: 0.05 ng/mL — AB (ref <0.031)
TROPONIN I: 0.06 ng/mL — AB (ref <0.031)
TROPONIN I: 0.05 ng/mL — AB (ref <0.031)
Troponin I: 0.04 ng/mL — ABNORMAL HIGH (ref <0.031)
Troponin I: 0.04 ng/mL — ABNORMAL HIGH (ref <0.031)

## 2015-11-05 NOTE — Progress Notes (Signed)
  Pt without complaints. Passing "a lot of gas".   Filed Vitals:   11/05/15 0851 11/05/15 1251  BP: 171/77 179/73  Pulse: 77 71  Temp:  97.3 F (36.3 C)  Resp:  32    Intake/Output Summary (Last 24 hours) at 11/05/15 2001 Last data filed at 11/05/15 1941  Gross per 24 hour  Intake    340 ml  Output   1875 ml  Net  -1535 ml    PE: Lying in bed A&Ox3, visiting with family/friend CV - RRR Abd - Soft, ND, NT.  incisions well healed, no e/e/i.   A/P - s/p RIGHT robotic partial nephrectomy 10/23/15  - doing well. Stable, waiting for SNF.

## 2015-11-06 DIAGNOSIS — E039 Hypothyroidism, unspecified: Secondary | ICD-10-CM | POA: Diagnosis not present

## 2015-11-06 DIAGNOSIS — C641 Malignant neoplasm of right kidney, except renal pelvis: Secondary | ICD-10-CM | POA: Diagnosis not present

## 2015-11-06 DIAGNOSIS — M0570 Rheumatoid arthritis with rheumatoid factor of unspecified site without organ or systems involvement: Secondary | ICD-10-CM | POA: Diagnosis not present

## 2015-11-06 DIAGNOSIS — I4891 Unspecified atrial fibrillation: Secondary | ICD-10-CM | POA: Diagnosis not present

## 2015-11-06 DIAGNOSIS — I509 Heart failure, unspecified: Secondary | ICD-10-CM | POA: Diagnosis not present

## 2015-11-06 DIAGNOSIS — N3281 Overactive bladder: Secondary | ICD-10-CM | POA: Diagnosis not present

## 2015-11-06 DIAGNOSIS — K219 Gastro-esophageal reflux disease without esophagitis: Secondary | ICD-10-CM | POA: Diagnosis not present

## 2015-11-06 DIAGNOSIS — F329 Major depressive disorder, single episode, unspecified: Secondary | ICD-10-CM | POA: Diagnosis not present

## 2015-11-06 DIAGNOSIS — E559 Vitamin D deficiency, unspecified: Secondary | ICD-10-CM | POA: Diagnosis not present

## 2015-11-06 DIAGNOSIS — D649 Anemia, unspecified: Secondary | ICD-10-CM | POA: Diagnosis not present

## 2015-11-06 DIAGNOSIS — E785 Hyperlipidemia, unspecified: Secondary | ICD-10-CM | POA: Diagnosis not present

## 2015-11-06 DIAGNOSIS — G4700 Insomnia, unspecified: Secondary | ICD-10-CM | POA: Diagnosis not present

## 2015-11-06 DIAGNOSIS — J449 Chronic obstructive pulmonary disease, unspecified: Secondary | ICD-10-CM | POA: Diagnosis not present

## 2015-11-06 DIAGNOSIS — F411 Generalized anxiety disorder: Secondary | ICD-10-CM | POA: Diagnosis not present

## 2015-11-06 DIAGNOSIS — I10 Essential (primary) hypertension: Secondary | ICD-10-CM | POA: Diagnosis not present

## 2015-11-06 DIAGNOSIS — H409 Unspecified glaucoma: Secondary | ICD-10-CM | POA: Diagnosis not present

## 2015-11-06 DIAGNOSIS — B029 Zoster without complications: Secondary | ICD-10-CM | POA: Diagnosis not present

## 2015-11-06 LAB — TROPONIN I: Troponin I: 0.06 ng/mL — ABNORMAL HIGH (ref <0.031)

## 2015-11-06 NOTE — Clinical Social Work Note (Signed)
CSW has informed Marden Noble at [facility] that patient is coming to them today. Discharge information sent to [facility]. Patient's daughter is aware and requests transport via EMS.  Shela Leff MSW,LcSW [phone]

## 2015-11-06 NOTE — Clinical Social Work Placement (Signed)
   CLINICAL SOCIAL WORK PLACEMENT  NOTE  Date:  11/06/2015  Patient Details  Name: Caroline Nichols MRN: [id number] Date of Birth: [birth year]  Clinical Social Work is seeking post-discharge placement for this patient at the [facility] level of care (*CSW will initial, date and re-position this form in  chart as items are completed):  Yes   Patient/family provided with [facility] Work Department's list of facilities offering this level of care within the geographic area requested by the patient (or if unable, by the patient's family).  Yes   Patient/family informed of their freedom to choose among providers that offer the needed level of care, that participate in Medicare, Medicaid or managed care program needed by the patient, have an available bed and are willing to accept the patient.  Yes   Patient/family informed of [facility]'s ownership interest in [facility] and [facility], as well as of the fact that they are under no obligation to receive care at these facilities.  PASRR submitted to EDS on 10/30/15     PASRR number received on 10/30/15     Existing PASRR number confirmed on       FL2 transmitted to all facilities in geographic area requested by pt/family on 10/30/15     FL2 transmitted to all facilities within larger geographic area on       Patient informed that his/her managed care company has contracts with or will negotiate with certain facilities, including the following:        Yes   Patient/family informed of bed offers received.  Patient chooses bed at  [facility])     Physician recommends and patient chooses bed at  [facility])    Patient to be transferred to  [facility]) on 11/06/15.  Patient to be transferred to facility by  (EMS)     Patient family notified on 11/06/15 of transfer.  Name of family member notified:   (daughter: Rise Paganini)     PHYSICIAN       Additional Comment:     _______________________________________________ Shela Leff, LCSW 11/06/2015, 2:05 PM

## 2015-11-06 NOTE — Discharge Summary (Signed)
Date of admission: 10/23/2015  Date of discharge: 11/06/2015  Admission diagnosis: Right renal mass   Discharge diagnosis: Right renal cell carcinoma, Afib with RVR, Ileus, Acute blood loss anemia, respiratory failure, leukocytosis, deconditioning  Secondary diagnoses:  Patient Active Problem List   Diagnosis Date Noted  . Atrial fibrillation with RVR ([place]) 10/28/2015  . Right renal mass 10/23/2015  . Preoperative evaluation of a medical condition to rule out surgical contraindications (TAR required) 10/12/2015  . Renal mass 10/03/2015  . Urinary frequency 07/28/2015  . Absolute anemia 05/10/2015  . Arthritis 05/10/2015  . Ache in joint 05/10/2015  . Nocturia 04/16/2015  . Neutropenia ([place]) 04/16/2015  . Hyperlipidemia 12/17/2014  . Depression 12/14/2014  . Xerostomia 11/14/2014  . Hyperglycemia 06/12/2014  . Diabetes insipidus ([place]) 06/12/2014  . S/P hysterectomy 06/09/2014  . Elevated CK 03/02/2014  . Decreased leukocytes 03/02/2014  . Medication management 11/11/2013  . Atherosclerotic peripheral vascular disease with intermittent claudication ([place]) 10/07/2013  . Edema of both legs 10/07/2013  . Venous stasis dermatitis 10/07/2013  . Chest pain 03/26/2013  . Left carotid bruit 03/26/2013  . Anemia of chronic disease 03/20/2013  . Polyarthralgia 03/20/2013  . Hypothyroidism 03/10/2013  . Bradycardia 12/09/2012  . Cervical vertebral fusion 12/09/2012  . Hot flash, menopausal 12/09/2012  . Gastrointestinal hemorrhage 12/09/2012  . Effusion of knee 12/09/2012  . Neuropathy ([place]) 12/09/2012  . Diuresis excessive 12/09/2012  . Compulsive tobacco user syndrome 12/09/2012  . Decreased body weight 12/09/2012  . Blush 12/09/2012  . Current tobacco use 12/09/2012  . Abnormal weight loss 12/09/2012  . IBS (irritable bowel syndrome)   . Degenerative arthritis of lumbar spine   . MURMUR 09/27/2009  . Postablative hypothyroidism 05/15/2009  . Anxiety state 05/15/2009  .  Essential hypertension 05/15/2009    History and Physical: For full details, please see admission history and physical. Briefly, Caroline Nichols is a 75 y.o. year old patient with right lower pole renal mass who was admitted following right laparoscopic robot-assisted partial nephrectomy.  Hospital Course: Patient tolerated the procedure well.  She was then transferred to the floor after an uneventful PACU stay.  Her hospital course was was complicated by A. fib with RVR, ileus, respiratory failure requiring nonrebreather mask, and acute blood loss anemia status post transfusion of 1 unit packed red blood cell.   1) acute blood loss anemia-appropriate bump in hematocrit following 1 unit packed red blood cell transfusion. No concern for ongoing bleeding. Hemoglobin remained stable thereafter.    2) leukocytosis- elevated without fevers/ chills. Stable.ID deferred.  Etiology unclear, persistent since immediate post op. May be related to steroids and stress response.   C. Diff negative.    3) Ileus- Managed with NGT.  Resolved spontaneously. Tolerating regular diet prior to discharge.  4) urinary retention- failed voiding trial 2. Foley catheter removed Friday 3/31  has been voiding independently since.  5) CHF/ COPD exacerbation/ hypoxia- managed by medicine consult service. CT PA negative. Echo normal.  CXR with atelectasis, poor inspiratory volumes. Steroid taper postop.  Pulmonary toilet with nebs. Weaned to nasal cannula, satting well.    6) Afib with RVR- responded to IV dilt gtt. Now in sinus rhythm, resolved.  7) Delirium- Stopped all potential medications which may be contributing including narcotics, antianxiety medication, and Benadryl. UCx neg. Resolved.    8) Renal mass- pathology consistent with clear cell carcinoma, pT1a, negative margins.    On POD#14 she had met discharge criteria: was eating a regular diet, was  up and ambulating with assistance,  pain was well  controlled, was voiding without a catheter, and was ready to for discharge.  Exam: Physical Exam  Constitutional: She is oriented to person, place, and time. She appears well-developed and well-nourished.  HENT:  Head: Normocephalic and atraumatic.  Neck: Normal range of motion.  Cardiovascular: Normal rate.   Pulmonary/Chest: Effort normal and breath sounds normal.  Wearing  2L [place]  Abdominal: Soft. Bowel sounds are normal.  Incision c/d/i  Musculoskeletal: Normal range of motion.  Neurological: She is alert and oriented to person, place, and time.  Skin: Skin is warm.  Psychiatric: She has a normal mood and affect. Her behavior is normal.  Vitals reviewed.    Laboratory values:   Recent Labs  11/04/15 0513  WBC 24.3*  HGB 7.9*  HCT 24.3*   No results for input(s): NA, K, CL, CO2, GLUCOSE, BUN, CREATININE, CALCIUM in the last 72 hours. No results for input(s): LABPT, INR in the last 72 hours. No results for input(s): LABURIN in the last 72 hours. Results for orders placed or performed during the hospital encounter of 10/23/15  Urine culture     Status: None   Collection Time: 10/28/15  3:26 PM  Result Value Ref Range Status   Specimen Description URINE, RANDOM  Final   Special Requests NONE  Final   Culture NO GROWTH 2 DAYS  Final   Report Status 10/30/2015 FINAL  Final  C difficile quick scan w PCR reflex     Status: Abnormal   Collection Time: 11/04/15  1:07 PM  Result Value Ref Range Status   C Diff antigen POSITIVE (A) NEGATIVE Final   C Diff toxin NEGATIVE NEGATIVE Final   C Diff interpretation   Final    Negative for toxigenic C. difficile. Toxin gene and active toxin production not detected. May be a nontoxigenic strain of C. difficile bacteria present, lacking the ability to produce toxin.  Clostridium Difficile by PCR     Status: None   Collection Time: 11/04/15  1:07 PM  Result Value Ref Range Status   Toxigenic C Difficile by pcr NEGATIVE NEGATIVE Final     Disposition: Home  Discharge instruction:   Activity:  You are encouraged to ambulate frequently (about every hour during waking hours) to help prevent blood clots from forming in your legs or lungs.  However, you should not engage in any heavy lifting (> 5-10 lbs), strenuous activity, or straining.   Diet: You should advance your diet as instructed by your physician.  It will be normal to have some bloating, nausea, and abdominal discomfort intermittently.   Prescriptions:  You will be provided a prescription for pain medication to take as needed.  If your pain is not severe enough to require the prescription pain medication, you may take extra strength Tylenol instead which will have less side effects.  You should also take a prescribed stool softener to avoid straining with bowel movements as the prescription pain medication may constipate you.   Incisions: You may remove your dressing bandages 48 hours after surgery if not removed in the hospital.  You will either have some small staples or special tissue glue at each of the incision sites. Once the bandages are removed (if present), the incisions may stay open to air.  You may start showering (but not soaking or bathing in water) the 2nd day after surgery and the incisions simply need to be patted dry after the shower.  No additional  care is needed.  What to call us about: You should call the office if you develop fever > 101 or develop persistent vomiting, redness or draining around your incision, or any other concerning symptoms.    [place] [address] [place], [place] [address] [phone]    Discharge medications:   Medication List    TAKE these medications        ALPRAZolam 0.25 MG tablet  Commonly known as:  XANAX  Take 1 tablet (0.25 mg total) by mouth 3 (three) times daily as needed for anxiety.     furosemide 20 MG tablet  Commonly known as:  LASIX  Take 1 tablet (20 mg  total) by mouth daily.     gabapentin 300 MG capsule  Commonly known as:  NEURONTIN  TAKE 1 CAPSULE (300 MG TOTAL) BY MOUTH 3 (THREE) TIMES DAILY.     hydrALAZINE 10 MG tablet  Commonly known as:  APRESOLINE  TAKE 1 TABLET (10 MG TOTAL) BY MOUTH 2 (TWO) TIMES DAILY.     hydroxychloroquine 200 MG tablet  Commonly known as:  PLAQUENIL  Take 2 tablets (400 mg total) by mouth daily.     ipratropium-albuterol 0.5-2.5 (3) MG/3ML Soln  Commonly known as:  DUONEB  Take 3 mLs by nebulization every 6 (six) hours as needed.     metoprolol tartrate 25 MG tablet  Commonly known as:  LOPRESSOR  Take 1 tablet (25 mg total) by mouth 2 (two) times daily.     predniSONE 20 MG tablet  Commonly known as:  DELTASONE  Take 1 tablet (20 mg total) by mouth daily with breakfast.     predniSONE 10 MG tablet  Commonly known as:  DELTASONE  Take 1 tablet (10 mg total) by mouth daily with breakfast.      ASK your doctor about these medications        acetaminophen 325 MG tablet  Commonly known as:  TYLENOL  Take 2 tablets (650 mg total) by mouth every 6 (six) hours as needed for pain.     brimonidine 0.2 % ophthalmic solution  Commonly known as:  ALPHAGAN  Place 1 drop into both eyes 2 (two) times daily.     D3-1000 PO  Take 1,000 Units by mouth daily. Reported on 07/28/2015     ferrous sulfate 325 (65 FE) MG tablet  Take 325 mg by mouth daily with breakfast.     Fish Oil 1000 MG Caps  Take 1,000 mg by mouth daily.     fluticasone 50 MCG/ACT nasal spray  Commonly known as:  FLONASE  Place 2 sprays into both nostrils daily.     ipratropium 0.03 % nasal spray  Commonly known as:  ATROVENT  Place 1 spray into both nostrils 2 (two) times daily. Reported on 07/28/2015     levothyroxine 112 MCG tablet  Commonly known as:  SYNTHROID, LEVOTHROID  TAKE 1 TABLET (112 MCG TOTAL) BY MOUTH DAILY BEFORE BREAKFAST.     losartan 50 MG tablet  Commonly known as:  COZAAR  Take 1 tablet (50 mg total)  by mouth daily.     mirabegron ER 50 MG Tb24 tablet  Commonly known as:  MYRBETRIQ  Take 1 tablet (50 mg total) by mouth daily.     mirtazapine 7.5 MG tablet  Commonly known as:  REMERON  TAKE 1 TABLET (7.5 MG TOTAL) BY MOUTH AT BEDTIME.     nitroGLYCERIN 0.4 MG/SPRAY spray  Commonly known as:  NITROLINGUAL  Place 1 spray under the tongue every 5 (five) minutes x 3 doses as needed for chest pain.     pantoprazole 40 MG tablet  Commonly known as:  PROTONIX  TAKE 1 TABLET (40 MG TOTAL) BY MOUTH DAILY.     SUPER CALCIUM/D 600-125 MG-UNIT Tabs  Generic drug:  Calcium-Vitamin D  Take by mouth. Taking 2 daily     vitamin C 500 MG tablet  Commonly known as:  ASCORBIC ACID  Take 500 mg by mouth daily.        Followup:      Follow-up Information    Follow up with Hollice Espy, MD In 4 weeks.   Specialty:  Urology   Why:  post op   Contact information:   [place] [address] [phone]       Follow up with [facility] .   Specialty:  [place] information:   [place] [phone]

## 2015-11-06 NOTE — Progress Notes (Signed)
Received call from lab with increased troponin level of 0.06.  Notified Dr Anselm Jungling who stated he would come round  On pt. Haynes Hoehn RN.

## 2015-11-06 NOTE — Care Management Important Message (Signed)
Important Message  Patient Details  Name: Caroline Nichols MRN: [id number] Date of Birth: 11-28-1940   Medicare Important Message Given:  Yes    Juliann Pulse [name] 11/06/2015, 11:14 AM

## 2015-11-07 ENCOUNTER — Telehealth: Payer: Self-pay | Admitting: *Deleted

## 2015-11-07 NOTE — Telephone Encounter (Signed)
Records of admission are in chart along with Urology notes for hospital stay, Forwarded to MD. Juluis Rainier

## 2015-11-07 NOTE — Telephone Encounter (Signed)
I have reviewed the hospital notes and am aware that she had a prolonged stay and was finally send home after 2 weeks,.  And the diagnosis was cancer but the margins were clear so  it looks like they got it all.

## 2015-11-07 NOTE — Telephone Encounter (Signed)
Juliann Pulse, Please advise if Dr. Derrel Nip received records. Thanks

## 2015-11-07 NOTE — Telephone Encounter (Signed)
Patients daughter, stated that her mother had surgery on 10/23/15 to remove a tumor from her kidney. Her daughter has requested to know if, Dr. Derrel Nip received the updates.

## 2015-11-08 NOTE — Telephone Encounter (Signed)
Left message MD is aware and records received any questions to call office.

## 2015-11-08 NOTE — Telephone Encounter (Signed)
Pt daughter called back regarding your call to her. I relayed message regarding the message you wrote. Pt daughter was ok with that. Thank you!

## 2015-11-23 ENCOUNTER — Telehealth: Payer: Self-pay | Admitting: Urology

## 2015-11-23 NOTE — Telephone Encounter (Signed)
Ms. Mizzell daughter called saying her mother came home from rehab today and she wanted to make Dr. Erlene Quan aware of this.  Pt's ph# [phone] Thank you.

## 2015-12-06 ENCOUNTER — Ambulatory Visit (INDEPENDENT_AMBULATORY_CARE_PROVIDER_SITE_OTHER): Payer: PPO | Admitting: Urology

## 2015-12-06 ENCOUNTER — Encounter: Payer: Self-pay | Admitting: Urology

## 2015-12-06 VITALS — BP 168/79 | HR 77 | Ht 62.0 in | Wt 131.0 lb

## 2015-12-06 DIAGNOSIS — C641 Malignant neoplasm of right kidney, except renal pelvis: Secondary | ICD-10-CM

## 2015-12-06 DIAGNOSIS — N3281 Overactive bladder: Secondary | ICD-10-CM

## 2015-12-06 LAB — URINALYSIS, COMPLETE
Bilirubin, UA: NEGATIVE
GLUCOSE, UA: NEGATIVE
KETONES UA: NEGATIVE
Leukocytes, UA: NEGATIVE
NITRITE UA: NEGATIVE
Protein, UA: NEGATIVE
SPEC GRAV UA: 1.01 (ref 1.005–1.030)
UUROB: 0.2 mg/dL (ref 0.2–1.0)
pH, UA: 6 (ref 5.0–7.5)

## 2015-12-06 LAB — MICROSCOPIC EXAMINATION
Bacteria, UA: NONE SEEN
WBC UA: NONE SEEN /HPF (ref 0–?)

## 2015-12-06 NOTE — Progress Notes (Signed)
12/06/2015 2:36 PM   Caroline Nichols 12/27/1940 [id number]  Referring provider: [name], MD [place], [place] [address]  Chief Complaint  Patient presents with  . Routine Post Op    HPI: 75 year old female who returns today status post right robotic partial nephrectomy on 10/23/2015. Her postoperative course was prolonged and complicated by acute blood loss anemia, respiratory failure, delirium, A. fib with RVR, ileus, and deconditioning.  She was ultimately discharged on 11/06/2015 to rehabilitation. 2 since been discharged from rehabilitation and is doing quite well. She is ambulating today with a cane. She feels like her energy level is still somewhat low but improving on a daily basis. She is tolerating a regular diet. No incisional pain or wound complications. She did develop shingles while in rehabilitation which is since improved.  Surgical pathology reviewed today. This is consistent with clear cell papillary renal cell carcinoma, negative margins, 2 cm, Fuhrman grade 2, negative margins. pT1aN0 M0.    PMH: Past Medical History  Diagnosis Date  . Hypertension   . Hyperthyroidism     s/p radiation therapy, now hypothyroidism  . IBS (irritable bowel syndrome)   . Herniated disc   . Pancreatic cyst   . Arthritis     Possible RA - Follow up appt with Dr. Jefm Bryant  . H/O transfusion of packed red blood cells 02/2013    3 units PRBC for severe anemia 02/2013  . Anemia 02/2013    F/U with Dr. Grayland Ormond for Feraheme injections  . Hyperlipidemia   . Allergy   . History of kidney stones   . Migraine   . Cellulitis of arm, right   . Paroxysmal a-fib ([place])   . Diabetes insipidus ([place])     On desmopressin  . H/O seasonal allergies   . Anxiety   . GERD (gastroesophageal reflux disease)   . Chronic kidney disease     Surgical History: Past Surgical History  Procedure Laterality Date  . Tubal ligation    . Cervical spine surgery      Bone  fusion  . Hammer toe surgery    . Abdominal hysterectomy    . Breast surgery  1990s    Biopsy  . Breast excisional biopsy Right 2005    neg  . Hernia repair Left     Inguinal Hernia Repair  . Robotic assited partial nephrectomy Right 10/23/2015    Procedure: ROBOTIC ASSITED PARTIAL NEPHRECTOMY with intraop ultrasound;  Surgeon: Hollice Espy, MD;  Location: [facility] ORS;  Service: Urology;  Laterality: Right;    Home Medications:    Medication List       This list is accurate as of: 12/06/15  2:36 PM.  Always use your most recent med list.               acetaminophen 325 MG tablet  Commonly known as:  TYLENOL  Take 2 tablets (650 mg total) by mouth every 6 (six) hours as needed for pain.     ALPRAZolam 0.25 MG tablet  Commonly known as:  XANAX  Take 1 tablet (0.25 mg total) by mouth 3 (three) times daily as needed for anxiety.     brimonidine 0.2 % ophthalmic solution  Commonly known as:  ALPHAGAN  Place 1 drop into both eyes 2 (two) times daily.     D3-1000 PO  Take 1,000 Units by mouth daily. Reported on 07/28/2015     ferrous sulfate 325 (65 FE) MG tablet  Take  325 mg by mouth daily with breakfast.     Fish Oil 1000 MG Caps  Take 1,000 mg by mouth daily.     fluticasone 50 MCG/ACT nasal spray  Commonly known as:  FLONASE  Place 2 sprays into both nostrils daily.     gabapentin 300 MG capsule  Commonly known as:  NEURONTIN  TAKE 1 CAPSULE (300 MG TOTAL) BY MOUTH 3 (THREE) TIMES DAILY.     hydrALAZINE 10 MG tablet  Commonly known as:  APRESOLINE  TAKE 1 TABLET (10 MG TOTAL) BY MOUTH 2 (TWO) TIMES DAILY.     hydroxychloroquine 200 MG tablet  Commonly known as:  PLAQUENIL  Take 2 tablets (400 mg total) by mouth daily.     ipratropium 0.03 % nasal spray  Commonly known as:  ATROVENT  Place 1 spray into both nostrils 2 (two) times daily. Reported on 07/28/2015     ipratropium-albuterol 0.5-2.5 (3) MG/3ML Soln  Commonly known as:  DUONEB  Take 3 mLs by  nebulization every 6 (six) hours as needed.     levothyroxine 112 MCG tablet  Commonly known as:  SYNTHROID, LEVOTHROID  TAKE 1 TABLET (112 MCG TOTAL) BY MOUTH DAILY BEFORE BREAKFAST.     losartan 50 MG tablet  Commonly known as:  COZAAR  Take 1 tablet (50 mg total) by mouth daily.     mirabegron ER 50 MG Tb24 tablet  Commonly known as:  MYRBETRIQ  Take 1 tablet (50 mg total) by mouth daily.     mirtazapine 7.5 MG tablet  Commonly known as:  REMERON  TAKE 1 TABLET (7.5 MG TOTAL) BY MOUTH AT BEDTIME.     nitroGLYCERIN 0.4 MG/SPRAY spray  Commonly known as:  NITROLINGUAL  Place 1 spray under the tongue every 5 (five) minutes x 3 doses as needed for chest pain.     pantoprazole 40 MG tablet  Commonly known as:  PROTONIX  TAKE 1 TABLET (40 MG TOTAL) BY MOUTH DAILY.     SUPER CALCIUM/D 600-125 MG-UNIT Tabs  Generic drug:  Calcium-Vitamin D  Take by mouth. Taking 2 daily     vitamin C 500 MG tablet  Commonly known as:  ASCORBIC ACID  Take 500 mg by mouth daily.        Allergies:  Allergies  Allergen Reactions  . Adhesive [Tape] Other (See Comments)    "irritate skin"  . Codeine     REACTION: NAUSEA  . Tetanus Toxoid     Other reaction(s): Unknown REACTION: ARM SWELLING,REDNESS  . Tetanus Toxoids   . Tramadol Nausea And Vomiting    Family History: Family History  Problem Relation Age of Onset  . Heart disease Mother   . Heart disease Father   . Diabetes Brother   . Kidney disease Brother   . Stroke Daughter     due to medication reaction  . Prostate cancer Neg Hx   . Hematuria Neg Hx     Social History:  reports that she quit smoking about 3 years ago. Her smoking use included Cigarettes. She has a 15 pack-year smoking history. She has never used smokeless tobacco. She reports that she does not drink alcohol or use illicit drugs.  ROS: UROLOGY Frequent Urination?: Yes Hard to postpone urination?: No Burning/pain with urination?: No Get up at night to  urinate?: No Leakage of urine?: No Urine stream starts and stops?: Yes Trouble starting stream?: No Do you have to strain to urinate?: No Blood in urine?: No Urinary  tract infection?: No Sexually transmitted disease?: No Injury to kidneys or bladder?: No Painful intercourse?: No Weak stream?: Yes Currently pregnant?: No Vaginal bleeding?: No Last menstrual period?: n  Gastrointestinal Nausea?: No Vomiting?: No Indigestion/heartburn?: Yes Diarrhea?: No Constipation?: No  Constitutional Fever: No Night sweats?: No Weight loss?: Yes Fatigue?: No  Skin Skin rash/lesions?: No Itching?: No  Eyes Blurred vision?: Yes Double vision?: No  Ears/Nose/Throat Sore throat?: No Sinus problems?: Yes  Hematologic/Lymphatic Swollen glands?: No Easy bruising?: Yes  Cardiovascular Leg swelling?: No Chest pain?: Yes  Respiratory Cough?: No Shortness of breath?: Yes  Endocrine Excessive thirst?: No  Musculoskeletal Back pain?: Yes Joint pain?: Yes  Neurological Headaches?: Yes Dizziness?: No  Psychologic Depression?: No Anxiety?: Yes  Physical Exam: BP 168/79 mmHg  Pulse 77  Ht '5\' 2"'$  (1.575 m)  Wt 131 lb (59.421 kg)  BMI 23.95 kg/m2  Constitutional:  Alert and oriented, No acute distress.  Well dressed. Ambulating independently with cane. HEENT: [place] AT, moist mucus membranes.  Trachea midline, no masses. Cardiovascular: No clubbing, cyanosis, or edema. Respiratory: Normal respiratory effort, no increased work of breathing. GI: Abdomen is soft, nontender, nondistended, no abdominal masses.  Abdominal incisions well-healed. GU: No CVA tenderness.  Skin: No rashes, bruises or suspicious lesions. Lymph: No cervical or inguinal adenopathy. Neurologic: Grossly intact, no focal deficits, moving all 4 extremities. Psychiatric: Normal mood and affect.  Laboratory Data: Lab Results  Component Value Date   WBC 24.3* 11/04/2015   HGB 7.9* 11/04/2015   HCT 24.3*  11/04/2015   MCV 90.6 11/04/2015   PLT 322 11/04/2015    Lab Results  Component Value Date   CREATININE 0.48 11/03/2015    Lab Results  Component Value Date   HGBA1C 5.3 06/09/2014    Urinalysis Results for orders placed or performed in visit on 12/06/15  Microscopic Examination  Result Value Ref Range   WBC, UA None seen 0 -  5 /hpf   RBC, UA 0-2 0 -  2 /hpf   Epithelial Cells (non renal) 0-10 0 - 10 /hpf   Bacteria, UA None seen None seen/Few  Urinalysis, Complete  Result Value Ref Range   Specific Gravity, UA 1.010 1.005 - 1.030   pH, UA 6.0 5.0 - 7.5   Color, UA Yellow Yellow   Appearance Ur Clear Clear   Leukocytes, UA Negative Negative   Protein, UA Negative Negative/Trace   Glucose, UA Negative Negative   Ketones, UA Negative Negative   RBC, UA Trace (A) Negative   Bilirubin, UA Negative Negative   Urobilinogen, Ur 0.2 0.2 - 1.0 mg/dL   Nitrite, UA Negative Negative   Microscopic Examination See below:      Assessment & Plan:    1. Renal cell carcinoma, right ([place]) S/p R partial nephrectomy 10/23/15 pT1N0Mo Recommend f/u CT abd/ pelvis with and without contrast in 6 months - Urinalysis, Complete - CBC - Basic metabolic panel  2. OAB/ urinary incontience Previously on Mybetriq. Samples given today.   Return in about 6 months (around 06/07/2016) for f/u CT scan.  Hollice Espy, MD  [facility] [address] [place],  [address] [phone]

## 2015-12-07 ENCOUNTER — Telehealth: Payer: Self-pay

## 2015-12-07 LAB — CBC
Hematocrit: 36.9 % (ref 34.0–46.6)
Hemoglobin: 11.7 g/dL (ref 11.1–15.9)
MCH: 30.1 pg (ref 26.6–33.0)
MCHC: 31.7 g/dL (ref 31.5–35.7)
MCV: 95 fL (ref 79–97)
PLATELETS: 122 10*3/uL — AB (ref 150–379)
RBC: 3.89 x10E6/uL (ref 3.77–5.28)
RDW: 16.8 % — AB (ref 12.3–15.4)
WBC: 2.3 10*3/uL — CL (ref 3.4–10.8)

## 2015-12-07 LAB — BASIC METABOLIC PANEL
BUN / CREAT RATIO: 16 (ref 12–28)
BUN: 12 mg/dL (ref 8–27)
CHLORIDE: 104 mmol/L (ref 96–106)
CO2: 23 mmol/L (ref 18–29)
Calcium: 9 mg/dL (ref 8.7–10.3)
Creatinine, Ser: 0.73 mg/dL (ref 0.57–1.00)
GFR calc non Af Amer: 81 mL/min/{1.73_m2} (ref >59)
GFR, EST AFRICAN AMERICAN: 94 mL/min/{1.73_m2} (ref >59)
Glucose: 104 mg/dL — ABNORMAL HIGH (ref 65–99)
POTASSIUM: 3.8 mmol/L (ref 3.5–5.2)
Sodium: 143 mmol/L (ref 134–144)

## 2015-12-07 NOTE — Telephone Encounter (Signed)
-----   Message from Hollice Espy, MD sent at 12/07/2015 12:30 PM EDT ----- Please let Caroline Nichols know that her labs look great, back to normal and less anemia.  Renal function is relatively stable.    Hollice Espy, MD

## 2015-12-07 NOTE — Telephone Encounter (Signed)
LMOM

## 2015-12-08 NOTE — Telephone Encounter (Signed)
Spoke with pt in reference to lab results. Pt was thrilled and voiced understanding.

## 2015-12-08 NOTE — Telephone Encounter (Signed)
LMOM

## 2015-12-15 ENCOUNTER — Ambulatory Visit: Payer: PPO

## 2015-12-15 ENCOUNTER — Other Ambulatory Visit: Payer: PPO

## 2015-12-20 ENCOUNTER — Inpatient Hospital Stay: Payer: PPO

## 2015-12-20 ENCOUNTER — Inpatient Hospital Stay: Payer: PPO | Attending: Family Medicine | Admitting: Family Medicine

## 2015-12-20 VITALS — BP 179/88 | HR 73 | Temp 96.9°F | Wt 131.2 lb

## 2015-12-20 DIAGNOSIS — I129 Hypertensive chronic kidney disease with stage 1 through stage 4 chronic kidney disease, or unspecified chronic kidney disease: Secondary | ICD-10-CM | POA: Diagnosis not present

## 2015-12-20 DIAGNOSIS — E1122 Type 2 diabetes mellitus with diabetic chronic kidney disease: Secondary | ICD-10-CM | POA: Insufficient documentation

## 2015-12-20 DIAGNOSIS — K589 Irritable bowel syndrome without diarrhea: Secondary | ICD-10-CM | POA: Insufficient documentation

## 2015-12-20 DIAGNOSIS — C649 Malignant neoplasm of unspecified kidney, except renal pelvis: Secondary | ICD-10-CM | POA: Insufficient documentation

## 2015-12-20 DIAGNOSIS — Z905 Acquired absence of kidney: Secondary | ICD-10-CM | POA: Diagnosis not present

## 2015-12-20 DIAGNOSIS — Z888 Allergy status to other drugs, medicaments and biological substances status: Secondary | ICD-10-CM | POA: Diagnosis not present

## 2015-12-20 DIAGNOSIS — M069 Rheumatoid arthritis, unspecified: Secondary | ICD-10-CM

## 2015-12-20 DIAGNOSIS — D709 Neutropenia, unspecified: Secondary | ICD-10-CM

## 2015-12-20 DIAGNOSIS — E039 Hypothyroidism, unspecified: Secondary | ICD-10-CM

## 2015-12-20 DIAGNOSIS — N189 Chronic kidney disease, unspecified: Secondary | ICD-10-CM | POA: Diagnosis not present

## 2015-12-20 DIAGNOSIS — D72819 Decreased white blood cell count, unspecified: Secondary | ICD-10-CM

## 2015-12-20 DIAGNOSIS — Z79899 Other long term (current) drug therapy: Secondary | ICD-10-CM | POA: Insufficient documentation

## 2015-12-20 DIAGNOSIS — E785 Hyperlipidemia, unspecified: Secondary | ICD-10-CM | POA: Diagnosis not present

## 2015-12-20 DIAGNOSIS — D61818 Other pancytopenia: Secondary | ICD-10-CM | POA: Insufficient documentation

## 2015-12-20 LAB — CBC WITH DIFFERENTIAL/PLATELET
Basophils Absolute: 0 10*3/uL (ref 0–0.1)
Basophils Relative: 1 %
EOS ABS: 0 10*3/uL (ref 0–0.7)
Eosinophils Relative: 1 %
HEMATOCRIT: 36.8 % (ref 35.0–47.0)
HEMOGLOBIN: 12.5 g/dL (ref 12.0–16.0)
LYMPHS ABS: 1.3 10*3/uL (ref 1.0–3.6)
LYMPHS PCT: 56 %
MCH: 30.4 pg (ref 26.0–34.0)
MCHC: 34 g/dL (ref 32.0–36.0)
MCV: 89.5 fL (ref 80.0–100.0)
Monocytes Absolute: 0.6 10*3/uL (ref 0.2–0.9)
Monocytes Relative: 26 %
NEUTROS ABS: 0.4 10*3/uL — AB (ref 1.7–7.7)
NEUTROS PCT: 18 %
Platelets: 109 10*3/uL — ABNORMAL LOW (ref 150–440)
RBC: 4.11 MIL/uL (ref 3.80–5.20)
RDW: 15.7 % — ABNORMAL HIGH (ref 11.5–14.5)
WBC: 2.2 10*3/uL — AB (ref 3.6–11.0)

## 2015-12-20 NOTE — Progress Notes (Signed)
[place] @ [facility] [phone]  Fax:([phone]) [place]: [birth year]  MR#: [id number]  EBR#:[id number]  Patient Care Team: Caroline Mc, MD as PCP - General (Internal Medicine) Caroline Mc, MD (Internal Medicine)  CHIEF COMPLAINT:  Pancytopenia, Renal cell carcinoma  Oncology Flowsheet 11/01/2015 11/02/2015 11/03/2015 11/04/2015 11/05/2015 11/05/2015 11/06/2015  ALPRAZolam (XANAX) PO - - - 0.25 mg 0.25 mg 0.25 mg 0.25 mg  dexamethasone (DECADRON) IV - - - - - - -  enoxaparin (LOVENOX) Mobridge - - - - - - -  methylPREDNISolone sodium succinate 125 mg/2 mL (SOLU-MEDROL) IV - - - - - - -  ondansetron (ZOFRAN) IV - 4 mg 4 mg - - - -  ondansetron (ZOFRAN) PO - - - - - - -  ondansetron (ZOFRAN-ODT) PO - - - - - - -  predniSONE (DELTASONE) PO 50 mg 50 mg 40 mg 30 mg 20 mg   10 mg    HISTORY OF PRESENT ILLNESS:   Caroline Nichols is referred to our clinic for evaluation of chronic moderate pancytopenia. She has a history of rheumatoid arthritis, for which she initially was treated with methotrexate, and since December 2014, with Plaquenil. According to Dr. Jefm Bryant the patient had had cytopenias even prior to initiation of Plaquenil, but it is not clear whether mild neutropenia which we can see on medical records from 2014 was detected while she was on methotrexate. Vitamin B12 and folate levels were normal when measured in 2014. She didn't have any history of recurrent infections. Her arthritis has been well controlled on blood continue with occasional pain in small joints as well as both knees.    Current status:  Caroline Nichols returns to our clinic for follow-up visit. Since her last visit in January 2017 patient has undergone a nephrectomy for renal cell carcinoma. Following her surgery she experienced a reaction to morphine which involved a 2 week hospital stay, subsequent rehabilitation in a nursing facility, as well as recent herpes zoster infection. She continues on her  Plaquenil as directed and reports that her RA symptoms are greatly improved. She denies fevers, weight loss, night sweats, nausea, vomiting, diarrhea, constipation.  REVIEW OF SYSTEMS:   Review of Systems  All other systems reviewed and are negative.    PAST MEDICAL HISTORY: Past Medical History  Diagnosis Date  . Hypertension   . Hyperthyroidism     s/p radiation therapy, now hypothyroidism  . IBS (irritable bowel syndrome)   . Herniated disc   . Pancreatic cyst   . Arthritis     Possible RA - Follow up appt with Dr. Jefm Bryant  . H/O transfusion of packed red blood cells 02/2013    3 units PRBC for severe anemia 02/2013  . Anemia 02/2013    F/U with Dr. Grayland Ormond for Feraheme injections  . Hyperlipidemia   . Allergy   . History of kidney stones   . Migraine   . Cellulitis of arm, right   . Paroxysmal a-fib ([place])   . Diabetes insipidus ([place])     On desmopressin  . H/O seasonal allergies   . Anxiety   . GERD (gastroesophageal reflux disease)   . Chronic kidney disease     PAST SURGICAL HISTORY: Past Surgical History  Procedure Laterality Date  . Tubal ligation    . Cervical spine surgery      Bone fusion  . Hammer toe surgery    . Abdominal hysterectomy    .  Breast surgery  1990s    Biopsy  . Breast excisional biopsy Right 2005    neg  . Hernia repair Left     Inguinal Hernia Repair  . Robotic assited partial nephrectomy Right 10/23/2015    Procedure: ROBOTIC ASSITED PARTIAL NEPHRECTOMY with intraop ultrasound;  Surgeon: Hollice Espy, MD;  Location: [facility] ORS;  Service: Urology;  Laterality: Right;    FAMILY HISTORY Family History  Problem Relation Age of Onset  . Heart disease Mother   . Heart disease Father   . Diabetes Brother   . Kidney disease Brother   . Stroke Daughter     due to medication reaction  . Prostate cancer Neg Hx   . Hematuria Neg Hx     ADVANCED DIRECTIVES:  No flowsheet data found.  HEALTH MAINTENANCE: Social History  Substance  Use Topics  . Smoking status: Former Smoker -- 0.50 packs/day for 30 years    Types: Cigarettes    Quit date: 12/03/2012  . Smokeless tobacco: Never Used  . Alcohol Use: No     Allergies  Allergen Reactions  . Adhesive [Tape] Other (See Comments)    "irritate skin"  . Codeine     REACTION: NAUSEA  . Morphine And Related Other (See Comments)    Shut down her organs  . Tetanus Toxoid     Other reaction(s): Unknown REACTION: ARM SWELLING,REDNESS  . Tetanus Toxoids   . Tramadol Nausea And Vomiting    Current Outpatient Prescriptions  Medication Sig Dispense Refill  . acetaminophen (TYLENOL) 325 MG tablet Take 2 tablets (650 mg total) by mouth every 6 (six) hours as needed for pain.    Marland Kitchen ALPRAZolam (XANAX) 0.25 MG tablet Take 1 tablet (0.25 mg total) by mouth 3 (three) times daily as needed for anxiety. 30 tablet 0  . brimonidine (ALPHAGAN) 0.2 % ophthalmic solution Place 1 drop into both eyes 2 (two) times daily.    . Calcium-Vitamin D (SUPER CALCIUM/D) 600-125 MG-UNIT TABS Take by mouth. Taking 2 daily    . Cholecalciferol (D3-1000 PO) Take 1,000 Units by mouth daily. Reported on 07/28/2015    . ferrous sulfate 325 (65 FE) MG tablet Take 325 mg by mouth daily with breakfast.    . fluticasone (FLONASE) 50 MCG/ACT nasal spray Place 2 sprays into both nostrils daily. 16 g 6  . gabapentin (NEURONTIN) 300 MG capsule TAKE 1 CAPSULE (300 MG TOTAL) BY MOUTH 3 (THREE) TIMES DAILY. 270 capsule 0  . hydrALAZINE (APRESOLINE) 10 MG tablet TAKE 1 TABLET (10 MG TOTAL) BY MOUTH 2 (TWO) TIMES DAILY. 60 tablet 3  . levothyroxine (SYNTHROID, LEVOTHROID) 112 MCG tablet TAKE 1 TABLET (112 MCG TOTAL) BY MOUTH DAILY BEFORE BREAKFAST. 90 tablet 1  . losartan (COZAAR) 50 MG tablet Take 1 tablet (50 mg total) by mouth daily. 90 tablet 3  . mirabegron ER (MYRBETRIQ) 50 MG TB24 tablet Take 1 tablet (50 mg total) by mouth daily. 30 tablet 2  . mirtazapine (REMERON) 7.5 MG tablet TAKE 1 TABLET (7.5 MG TOTAL) BY  MOUTH AT BEDTIME. 30 tablet 0  . nitroGLYCERIN (NITROLINGUAL) 0.4 MG/SPRAY spray Place 1 spray under the tongue every 5 (five) minutes x 3 doses as needed for chest pain. 4.9 g 11  . Omega-3 Fatty Acids (FISH OIL) 1000 MG CAPS Take 1,000 mg by mouth daily.     . pantoprazole (PROTONIX) 40 MG tablet TAKE 1 TABLET (40 MG TOTAL) BY MOUTH DAILY. 30 tablet 4  . vitamin C (ASCORBIC ACID)  500 MG tablet Take 500 mg by mouth daily.    Marland Kitchen ipratropium (ATROVENT) 0.03 % nasal spray Place 1 spray into both nostrils 2 (two) times daily. Reported on 12/20/2015     No current facility-administered medications for this visit.    OBJECTIVE:  Filed Vitals:   12/20/15 1010  BP: 179/88  Pulse: 73  Temp: 96.9 F (36.1 C)     Body mass index is 23.99 kg/(m^2).    ECOG FS:1 - Symptomatic but completely ambulatory  Physical Exam  Constitutional: She is oriented to person, place, and time and well-developed, well-nourished, and in no distress. No distress.  Elderly African-American female, using cane for ambulation.  HENT:  Head: Normocephalic and atraumatic.  Right Ear: External ear normal.  Left Ear: External ear normal.  Mouth/Throat: Oropharynx is clear and moist.  Eyes: Conjunctivae are normal. Pupils are equal, round, and reactive to light. Right eye exhibits no discharge. Left eye exhibits no discharge. No scleral icterus.  Neck: Normal range of motion. Neck supple. No JVD present. No tracheal deviation present. No thyromegaly present.  Cardiovascular: Normal rate, regular rhythm, normal heart sounds and intact distal pulses.  Exam reveals no gallop and no friction rub.   No murmur heard. Pulmonary/Chest: Effort normal and breath sounds normal. No stridor. No respiratory distress. She has no wheezes. She has no rales. She exhibits no tenderness.  Abdominal: Soft. Bowel sounds are normal. She exhibits no distension and no mass. There is no tenderness. There is no rebound and no guarding.    Musculoskeletal: Normal range of motion. She exhibits no edema or tenderness.  Bilateral finger joint deformities without erythema or edema  Lymphadenopathy:    She has no cervical adenopathy.  Neurological: She is alert and oriented to person, place, and time. She has normal reflexes. No cranial nerve deficit. She exhibits normal muscle tone. Gait normal. Coordination normal.  Skin: Skin is warm. No rash noted. She is not diaphoretic. No erythema. No pallor.  Psychiatric: Mood, memory, affect and judgment normal.  Nursing note and vitals reviewed.    LAB RESULTS:  Appointment on 12/20/2015  Component Date Value Ref Range Status  . WBC 12/20/2015 2.2* 3.6 - 11.0 K/uL Final  . RBC 12/20/2015 4.11  3.80 - 5.20 MIL/uL Final  . Hemoglobin 12/20/2015 12.5  12.0 - 16.0 g/dL Final  . HCT 12/20/2015 36.8  35.0 - 47.0 % Final  . MCV 12/20/2015 89.5  80.0 - 100.0 fL Final  . MCH 12/20/2015 30.4  26.0 - 34.0 pg Final  . MCHC 12/20/2015 34.0  32.0 - 36.0 g/dL Final  . RDW 12/20/2015 15.7* 11.5 - 14.5 % Final  . Platelets 12/20/2015 109* 150 - 440 K/uL Final  . Neutrophils Relative % 12/20/2015 18   Final  . Neutro Abs 12/20/2015 0.4* 1.7 - 7.7 K/uL Final   Comment: RESULT REPEATED AND VERIFIED CRITICAL RESULT CALLED TO, READ BACK BY AND VERIFIED WITH: LESLIE HERRING AT 1000 12/20/2015 KMR   . Lymphocytes Relative 12/20/2015 56   Final  . Lymphs Abs 12/20/2015 1.3  1.0 - 3.6 K/uL Final  . Monocytes Relative 12/20/2015 26   Final  . Monocytes Absolute 12/20/2015 0.6  0.2 - 0.9 K/uL Final  . Eosinophils Relative 12/20/2015 1   Final  . Eosinophils Absolute 12/20/2015 0.0  0 - 0.7 K/uL Final  . Basophils Relative 12/20/2015 1   Final  . Basophils Absolute 12/20/2015 0.0  0 - 0.1 K/uL Final  STUDIES: No results found.  ASSESSMENT and MEDICAL DECISION MAKING:  Pancytopenia. Bone marrow biopsy showed no evidence of myelodysplastic syndrome, plasma cell dyscrasia or any other form of  hematologic malignancy. It is possible, that either autoimmune process, accompanying rheumatoid arthritis, or medication effect of hydroxychloroquine are responsible for blood count abnormalities. Her WBC today is 2.2 with an [place] of 400. While ANC is critically low, patient is asymptomatic has had no fevers, chills, urinary frequency, cough or congestion. Discussed with Dr. Grayland Ormond who patient has seen in the past, he recommended further lab evaluation in approximately one month and then follow-up in 3 months. Patient was evaluated yesterday by Dr. Cristi Loron, rheumatology. We will fax results of labs to Dr. Jefm Bryant. If there is any further recommendations from rheumatology please contact our office.  Patient recently underwent extensive hospital stay due to side effect of medication given during nephrectomy. She reports overall feeling very well and back to her baseline.  Patient expressed understanding and was in agreement with this plan. She also understands that She can call clinic at any time with any questions, concerns, or complaints.   Dr. Grayland Ormond was available for consultation and review of plan of care for this patient.  Evlyn Kanner, NP   12/20/2015 10:55 AM

## 2015-12-20 NOTE — Progress Notes (Signed)
Pt here for Evaluation of Renal Cell Carcinoma. R nephrectomy 10/23/15. Pt stayed in the hospital for 2 weeks d/t a drug reaction to morphine). She then went to rehab for 1 month. She developed shingles in rehab on her R side and back. Today she is much improved. Energy is better. Walking with a cane. Has only mild tenderness at surgical site R abdomen. Appetite is good. Bowels are normal.

## 2015-12-21 ENCOUNTER — Other Ambulatory Visit: Payer: Self-pay | Admitting: Internal Medicine

## 2015-12-28 ENCOUNTER — Ambulatory Visit: Payer: PPO | Admitting: Internal Medicine

## 2015-12-28 DIAGNOSIS — Z0289 Encounter for other administrative examinations: Secondary | ICD-10-CM

## 2016-01-11 ENCOUNTER — Ambulatory Visit (INDEPENDENT_AMBULATORY_CARE_PROVIDER_SITE_OTHER): Payer: PPO | Admitting: Internal Medicine

## 2016-01-11 ENCOUNTER — Telehealth: Payer: Self-pay | Admitting: *Deleted

## 2016-01-11 ENCOUNTER — Encounter: Payer: Self-pay | Admitting: Internal Medicine

## 2016-01-11 VITALS — BP 160/92 | HR 77 | Temp 97.8°F | Resp 12 | Ht 62.0 in | Wt 132.2 lb

## 2016-01-11 DIAGNOSIS — R350 Frequency of micturition: Secondary | ICD-10-CM | POA: Diagnosis not present

## 2016-01-11 DIAGNOSIS — C641 Malignant neoplasm of right kidney, except renal pelvis: Secondary | ICD-10-CM

## 2016-01-11 DIAGNOSIS — R0789 Other chest pain: Secondary | ICD-10-CM

## 2016-01-11 DIAGNOSIS — R531 Weakness: Secondary | ICD-10-CM

## 2016-01-11 DIAGNOSIS — D61818 Other pancytopenia: Secondary | ICD-10-CM

## 2016-01-11 DIAGNOSIS — I4891 Unspecified atrial fibrillation: Secondary | ICD-10-CM

## 2016-01-11 MED ORDER — GABAPENTIN 300 MG PO CAPS: 300.0000 mg | ORAL_CAPSULE | Freq: Three times a day (TID) | ORAL | Status: DC

## 2016-01-11 MED ORDER — GABAPENTIN 100 MG PO CAPS: 100.0000 mg | ORAL_CAPSULE | Freq: Three times a day (TID) | ORAL | Status: DC

## 2016-01-11 NOTE — Progress Notes (Signed)
Pre-visit discussion using our clinic review tool. No additional management support is needed unless otherwise documented below in the visit note.  

## 2016-01-11 NOTE — Telephone Encounter (Signed)
Patient questioned if Dr.Tullo faxed over her medication over for her shingles

## 2016-01-11 NOTE — Telephone Encounter (Signed)
Yes, I have refilled gabapentin for use tid prn pain

## 2016-01-11 NOTE — Patient Instructions (Addendum)
   I have ordered Physical therapy at [facility] for you  I have made a referral to Dr Rockey Situ to have your heart checked out.   You might want to ask your daughter to get you a premixed protein drink called Premier Protein shake in the morning.  It is less $$$ and very low sugar.   Available at [facility]   160 cal  30 g protein  1 g sugar 50% calcium needs

## 2016-01-11 NOTE — Telephone Encounter (Signed)
Were you calling in something for shingles?

## 2016-01-11 NOTE — Progress Notes (Signed)
Subjective:  Patient ID: Caroline Nichols, female    DOB: Mar 27, 1941  Age: 75 y.o. MRN: [id number]  CC: The primary encounter diagnosis was Generalized weakness. Diagnoses of Other chest pain, Urinary frequency, Renal cell carcinoma of right kidney [facility]), Atrial fibrillation with RVR ([place]), and Acquired pancytopenia ([place]) were also pertinent to this visit.  HPI Caroline Nichols presents for hospital follow up.  She was admitted to [facility] on March 19 and discharged on April 3 to rehab facility Came home six weeks ago. The reason for the hospitalization was for scheduled was for removal of a mass on her right kidney  via right robotic  partial nephrectomy, which was done by Hollice Espy, MD on March 20. Post operative period was complicated by an adverse reaction to morphine resulting in delirium and respiratory failure followed by  atrial fib with RVR requiring ICU transfer and NIVVM , discharged on April 3rd to [facility] .   While in rehab developed shingles  on the right shoulder.  Received acyclovir,  the pain is still bothering her at night when she gets overheated.. 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  6 monthfollow up CT recommended (NovembOct/Nov) .  Discahrged from [facility] 6 weeks ago .  PT was offered at discharge and she declined bc the PT at [facility] is poor. Patient does not drive. Willing to go to [facility] . PT Tues throgh Fridays in the am    Had a positive c dif antigen,  Toxin negative,  While in hospital,  Not treated.  Stools are  Solid and moving  tiwce daily .   appetite is good .  Her cravings for  [place]  peppermint patties and peeanut butter have resolved.   Had hemorrhoids while in rehab , no longer bothering her  Still has low energy,  Can't walk very far.  Gets chest tightness whenever she uses her arms,  even to fold clothes or dry her hair.  Marland Kitchen  Has not seen cardiologist  . Dr Rockey Situ  since 2014   Followed by [facility] for  leukopenia    .  Treated for OAB with Myrbetriq  Outpatient Prescriptions Prior to Visit  Medication Sig Dispense Refill  . acetaminophen (TYLENOL) 325 MG tablet Take 2 tablets (650 mg total) by mouth every 6 (six) hours as needed for pain.    Marland Kitchen ALPRAZolam (XANAX) 0.25 MG tablet Take 1 tablet (0.25 mg total) by mouth 3 (three) times daily as needed for anxiety. 30 tablet 0  . brimonidine (ALPHAGAN) 0.2 % ophthalmic solution Place 1 drop into both eyes 2 (two) times daily.    . Calcium-Vitamin D (SUPER CALCIUM/D) 600-125 MG-UNIT TABS Take by mouth. Taking 2 daily    . Cholecalciferol (D3-1000 PO) Take 1,000 Units by mouth daily. Reported on 07/28/2015    . ferrous sulfate 325 (65 FE) MG tablet Take 325 mg by mouth daily with breakfast.    . fluticasone (FLONASE) 50 MCG/ACT nasal spray Place 2 sprays into both nostrils daily. 16 g 6  . hydrALAZINE (APRESOLINE) 10 MG tablet TAKE 1 TABLET (10 MG TOTAL) BY MOUTH 2 (TWO) TIMES DAILY. 60 tablet 3  . hydrALAZINE (APRESOLINE) 10 MG tablet TAKE 1 TABLET (10 MG TOTAL) BY MOUTH 2 (TWO) TIMES DAILY. 90 tablet 0  . ipratropium (ATROVENT) 0.03 % nasal spray Place 1 spray into both nostrils 2 (two) times daily. Reported on 12/20/2015    . levothyroxine (SYNTHROID,  LEVOTHROID) 112 MCG tablet TAKE 1 TABLET (112 MCG TOTAL) BY MOUTH DAILY BEFORE BREAKFAST. 90 tablet 1  . losartan (COZAAR) 50 MG tablet Take 1 tablet (50 mg total) by mouth daily. 90 tablet 3  . mirabegron ER (MYRBETRIQ) 50 MG TB24 tablet Take 1 tablet (50 mg total) by mouth daily. 30 tablet 2  . mirtazapine (REMERON) 7.5 MG tablet TAKE 1 TABLET (7.5 MG TOTAL) BY MOUTH AT BEDTIME. 30 tablet 0  . nitroGLYCERIN (NITROLINGUAL) 0.4 MG/SPRAY spray Place 1 spray under the tongue every 5 (five) minutes x 3 doses as needed for chest pain. 4.9 g 11  . Omega-3 Fatty Acids (FISH OIL) 1000 MG CAPS Take 1,000 mg by mouth daily.     . pantoprazole (PROTONIX) 40 MG tablet TAKE 1 TABLET (40 MG TOTAL) BY MOUTH DAILY.  30 tablet 4  . vitamin C (ASCORBIC ACID) 500 MG tablet Take 500 mg by mouth daily.    Marland Kitchen gabapentin (NEURONTIN) 300 MG capsule TAKE 1 CAPSULE (300 MG TOTAL) BY MOUTH 3 (THREE) TIMES DAILY. 270 capsule 0   No facility-administered medications prior to visit.    Review of Systems;  Patient denies headache, fevers, malaise, unintentional weight loss, skin rash, eye pain, sinus congestion and sinus pain, sore throat, dysphagia,  hemoptysis , cough, dyspnea, wheezing, chest pain, palpitations, orthopnea, edema, abdominal pain, nausea, melena, diarrhea, constipation, flank pain, dysuria, hematuria, urinary  Frequency, nocturia, numbness, tingling, seizures,  Focal weakness, Loss of consciousness,  Tremor, insomnia, depression, anxiety, and suicidal ideation.      Objective:  BP 160/92 mmHg  Pulse 77  Temp(Src) 97.8 F (36.6 C) (Oral)  Resp 12  Ht '5\' 2"'$  (1.575 m)  Wt 132 lb 4 oz (59.988 kg)  BMI 24.18 kg/m2  SpO2 96%  BP Readings from Last 3 Encounters:  01/11/16 160/92  12/20/15 179/88  12/06/15 168/79    Wt Readings from Last 3 Encounters:  01/11/16 132 lb 4 oz (59.988 kg)  12/20/15 131 lb 2.8 oz (59.5 kg)  12/06/15 131 lb (59.421 kg)    General appearance: alert, cooperative and appears stated age Ears: normal TM's and external ear canals both ears Throat: lips, mucosa, and tongue normal; teeth and gums normal Neck: no adenopathy, no carotid bruit, supple, symmetrical, trachea midline and thyroid not enlarged, symmetric, no tenderness/mass/nodules Back: symmetric, no curvature. ROM normal. No CVA tenderness. Lungs: clear to auscultation bilaterally Heart: regular rate and rhythm, S1, S2 normal, no murmur, click, rub or gallop Abdomen: soft, non-tender; bowel sounds normal; no masses,  no organomegaly Pulses: 2+ and symmetric Skin: Skin color, texture, turgor normal. No rashes or lesions Lymph nodes: Cervical, supraclavicular, and axillary nodes normal.  Lab Results    Component Value Date   HGBA1C 5.3 06/09/2014   HGBA1C 5.2 12/27/2012    Lab Results  Component Value Date   CREATININE 0.73 12/06/2015   CREATININE 0.48 11/03/2015   CREATININE 0.52 11/02/2015    Lab Results  Component Value Date   WBC 2.2* 12/20/2015   HGB 12.5 12/20/2015   HCT 36.8 12/20/2015   PLT 109* 12/20/2015   GLUCOSE 104* 12/06/2015   CHOL 167 01/11/2015   TRIG 72.0 01/11/2015   HDL 54.50 01/11/2015   LDLCALC 98 01/11/2015   ALT 23 07/11/2015   AST 42* 07/11/2015   NA 143 12/06/2015   K 3.8 12/06/2015   CL 104 12/06/2015   CREATININE 0.73 12/06/2015   BUN 12 12/06/2015   CO2 23 12/06/2015   TSH  1.63 07/11/2015   INR 1.11 10/23/2015   HGBA1C 5.3 06/09/2014   MICROALBUR 0.7 06/09/2014    Ct Angio Chest Pe W/cm &/or Wo Cm  10/30/2015  CLINICAL DATA:  75 year old female inpatient with hypoxia. Recent right partial nephrectomy for renal mass. EXAM: CT ANGIOGRAPHY CHEST WITH CONTRAST TECHNIQUE: Multidetector CT imaging of the chest was performed using the standard protocol during bolus administration of intravenous contrast. Multiplanar CT image reconstructions and MIPs were obtained to evaluate the vascular anatomy. CONTRAST:  75 cc Isovue 370 IV. COMPARISON:  10/28/2015 chest radiograph. 03/09/2013 chest CT angiogram. FINDINGS: Mediastinum/Nodes: The study is high quality for the evaluation of pulmonary embolism. There are no filling defects in the central, lobar, segmental or subsegmental pulmonary artery branches to suggest acute pulmonary embolism. Great vessels are normal in course and caliber. Normal heart size. Trace pericardial fluid/thickening. Coronary atherosclerosis. Thyroid appears atrophic versus surgically absent. Normal esophagus. No pathologically enlarged axillary, mediastinal or hilar lymph nodes. Lungs/Pleura: No pneumothorax. Layering small right and trace left pleural effusions. Mild-to-moderate compressive atelectasis in the bilateral dependent lower  lobes. Mild centrilobular and paraseptal emphysema with diffuse bronchial wall thickening. There is a 1.0 x 0.5 cm subpleural nodule in the anterior apical right upper lobe (series 6/ image 17), new since 03/09/2013. No additional significant pulmonary nodules in the aerated lungs. Mild interlobular septal thickening in both lungs. Upper abdomen: Unremarkable. Musculoskeletal: No aggressive appearing focal osseous lesions. Marked degenerative changes in the thoracic spine. Review of the MIP images confirms the above findings. IMPRESSION: 1. No pulmonary embolism. 2. Small right and trace left pleural effusions with mild to moderate bilateral lower lobe atelectasis. 3. Mild interlobular septal thickening in both lungs, suggesting mild pulmonary edema. 4. Coronary atherosclerosis. Electronically Signed   By: Ilona Sorrel M.D.   On: 10/30/2015 17:39   Dg Chest Port 1 View  10/31/2015  CLINICAL DATA:  Acute onset of tachypnea and upper abdominal pain. Shortness of breath. Initial encounter. EXAM: PORTABLE CHEST 1 VIEW COMPARISON:  Chest radiograph performed 10/28/2015, and CTA of the chest performed 10/30/2015 FINDINGS: The lungs are hypoexpanded. Mild bibasilar atelectasis is noted. No pleural effusion or pneumothorax is seen. The cardiomediastinal silhouette is borderline normal in size. No acute osseous abnormalities are seen. IMPRESSION: Lungs hypoexpanded.  Mild bibasilar atelectasis noted. Electronically Signed   By: Garald Balding M.D.   On: 10/31/2015 22:44    Assessment & Plan:   Problem List Items Addressed This Visit    Acquired pancytopenia ([place])    Chronic for many years.  With prior transfusions .  managed by Hematology,  Has neutropenia as well.       Chest pain    Exertional,  occures wit huse of her arms.  Not accpompanied by diaphoresis, dyspnea,  But she has known PAD and has not seen cardiollgy in over 2 years.  Referral to Dr Rockey Situ for risk stratification      Relevant Orders    Ambulatory referral to Cardiology   Urinary frequency    Symptoms have improved with use of Myrbetriq.       Renal cell carcinoma of right kidney ([facility])    Resected with clear margins via robotic partial right nephrectomy march 20. 6 month follow up CT is advised .  No furhter treatment needed.       Atrial fibrillation with RVR ([place])    Occurred during the post operative period in the setting of acute blood loss anemia and respiratory failure.  No cardiology  evaluation per recored.  Appearss to be i sinus rhythm today without use of beta or calcium channel blockers.       Generalized weakness - Primary    She is using a can to assist in ambulation and continues to feel unsure of herself and easily fatigued.  Cardiology and PT ordered      Relevant Orders   Ambulatory referral to Physical Therapy     A total of 40 minutes was spent with patient more than half of which was spent in counseling patient on the above mentioned issues , reviewing and explaining recent labs and imaging studies done, and coordination of care.  I am having Caroline Nichols maintain her Fish Oil, Calcium-Vitamin D, acetaminophen, vitamin C, ferrous sulfate, Cholecalciferol (D3-1000 PO), brimonidine, fluticasone, pantoprazole, nitroGLYCERIN, mirtazapine, losartan, ipratropium, mirabegron ER, levothyroxine, hydrALAZINE, ALPRAZolam, hydrALAZINE, hydroxychloroquine, and latanoprost.  Meds ordered this encounter  Medications  . hydroxychloroquine (PLAQUENIL) 200 MG tablet    Sig: Take 200 mg by mouth daily.   Marland Kitchen latanoprost (XALATAN) 0.005 % ophthalmic solution    Sig: Place 1 drop into both eyes at bedtime.     There are no discontinued medications.  Follow-up: Return in about 6 months (around 07/12/2016) for annual wellness.   [name], MD

## 2016-01-11 NOTE — Telephone Encounter (Signed)
Tried to reach patient by phone memory full.

## 2016-01-12 NOTE — Telephone Encounter (Signed)
Left message on machine for patient to return our call 

## 2016-01-12 NOTE — Telephone Encounter (Signed)
Pt has a couple questions about the gabapentin '100mg'$  or '300mg'$ .. Please advise pt

## 2016-01-12 NOTE — Telephone Encounter (Signed)
Left message on machine for patient returning her call 

## 2016-01-13 DIAGNOSIS — R5383 Other fatigue: Secondary | ICD-10-CM | POA: Insufficient documentation

## 2016-01-13 NOTE — Assessment & Plan Note (Signed)
She is using a can to assist in ambulation and continues to feel unsure of herself and easily fatigued.  Cardiology and PT ordered

## 2016-01-13 NOTE — Assessment & Plan Note (Signed)
Symptoms have improved with use of Myrbetriq.

## 2016-01-13 NOTE — Assessment & Plan Note (Addendum)
Chronic for many years.  With prior transfusions .  managed by Hematology,  Has neutropenia as well.

## 2016-01-13 NOTE — Assessment & Plan Note (Signed)
Resected with clear margins via robotic partial right nephrectomy march 20. 6 month follow up CT is advised .  No furhter treatment needed.

## 2016-01-13 NOTE — Assessment & Plan Note (Signed)
Exertional,  occures wit huse of her arms.  Not accpompanied by diaphoresis, dyspnea,  But she has known PAD and has not seen cardiollgy in over 2 years.  Referral to Dr Rockey Situ for risk stratification

## 2016-01-13 NOTE — Assessment & Plan Note (Signed)
Occurred during the post operative period in the setting of acute blood loss anemia and respiratory failure.  No cardiology evaluation per recored.  Appearss to be i sinus rhythm today without use of beta or calcium channel blockers.

## 2016-01-15 NOTE — Telephone Encounter (Signed)
yes

## 2016-01-15 NOTE — Telephone Encounter (Signed)
Left message on machine for patient to return our call 

## 2016-01-15 NOTE — Telephone Encounter (Signed)
Pt called back about her gabapentin (NEURONTIN) 100 MG capsule. Please call her back at [phone].

## 2016-01-15 NOTE — Telephone Encounter (Signed)
Patient wanted to clarify that she is to take her normal '300mg'$  tid and then add the 100 for the additional due to the shingle pain?  Please advise she is reading it correctly. thanks

## 2016-01-15 NOTE — Telephone Encounter (Signed)
Left detailed message on machine for patient with directions of gabapentin.

## 2016-01-19 ENCOUNTER — Encounter: Payer: Self-pay | Admitting: *Deleted

## 2016-01-23 ENCOUNTER — Other Ambulatory Visit: Payer: Self-pay | Admitting: Internal Medicine

## 2016-01-23 ENCOUNTER — Inpatient Hospital Stay: Payer: PPO | Attending: Oncology

## 2016-01-23 DIAGNOSIS — D72819 Decreased white blood cell count, unspecified: Secondary | ICD-10-CM

## 2016-01-23 DIAGNOSIS — C641 Malignant neoplasm of right kidney, except renal pelvis: Secondary | ICD-10-CM | POA: Insufficient documentation

## 2016-01-23 LAB — CBC WITH DIFFERENTIAL/PLATELET
BASOS ABS: 0 10*3/uL (ref 0–0.1)
Basophils Relative: 1 %
Eosinophils Absolute: 0 10*3/uL (ref 0–0.7)
Eosinophils Relative: 1 %
HEMATOCRIT: 37.3 % (ref 35.0–47.0)
Hemoglobin: 12.7 g/dL (ref 12.0–16.0)
LYMPHS PCT: 53 %
Lymphs Abs: 1.1 10*3/uL (ref 1.0–3.6)
MCH: 29.9 pg (ref 26.0–34.0)
MCHC: 34 g/dL (ref 32.0–36.0)
MCV: 88 fL (ref 80.0–100.0)
Monocytes Absolute: 0.5 10*3/uL (ref 0.2–0.9)
Monocytes Relative: 24 %
Neutro Abs: 0.4 10*3/uL — ABNORMAL LOW (ref 1.7–7.7)
Neutrophils Relative %: 21 %
Platelets: 86 10*3/uL — ABNORMAL LOW (ref 150–440)
RBC: 4.24 MIL/uL (ref 3.80–5.20)
RDW: 15 % — ABNORMAL HIGH (ref 11.5–14.5)
WBC: 2.1 10*3/uL — ABNORMAL LOW (ref 3.6–11.0)

## 2016-01-25 ENCOUNTER — Ambulatory Visit: Payer: PPO | Attending: Internal Medicine

## 2016-01-25 ENCOUNTER — Other Ambulatory Visit: Payer: Self-pay | Admitting: *Deleted

## 2016-01-25 ENCOUNTER — Ambulatory Visit: Payer: PPO | Admitting: *Deleted

## 2016-01-25 ENCOUNTER — Encounter: Payer: Self-pay | Admitting: Physical Therapy

## 2016-01-25 ENCOUNTER — Telehealth: Payer: Self-pay | Admitting: Internal Medicine

## 2016-01-25 VITALS — BP 155/76 | HR 52

## 2016-01-25 DIAGNOSIS — I10 Essential (primary) hypertension: Secondary | ICD-10-CM

## 2016-01-25 DIAGNOSIS — M6281 Muscle weakness (generalized): Secondary | ICD-10-CM | POA: Insufficient documentation

## 2016-01-25 DIAGNOSIS — R262 Difficulty in walking, not elsewhere classified: Secondary | ICD-10-CM | POA: Diagnosis not present

## 2016-01-25 NOTE — Telephone Encounter (Signed)
Left detailed message for patient on home voicemail.

## 2016-01-25 NOTE — Telephone Encounter (Signed)
Okay to refill? 

## 2016-01-25 NOTE — Therapy (Signed)
[facility] September 12, [address] [place], [address] Phone: [phone]   Fax:  [phone]  Physical Therapy Evaluation  Patient Details  Name: Caroline Nichols MRN: [id number] Date of Birth: 09/26/1940 Referring Provider: Deborra Medina  Encounter Date: 01/25/2016      PT End of Session - 01/25/16 1141    Visit Number 1   Number of Visits 17   Date for PT Re-Evaluation 03-29-16   Authorization Type g codes   PT Start Time 09/13/1103   PT Stop Time 09-13-03   PT Time Calculation (min) 60 min   Equipment Utilized During Treatment Gait belt   Activity Tolerance Patient tolerated treatment well   Behavior During Therapy [facility] for tasks assessed/performed      Past Medical History  Diagnosis Date  . Hypertension   . Hyperthyroidism     s/p radiation therapy, now hypothyroidism  . IBS (irritable bowel syndrome)   . Herniated disc   . Pancreatic cyst   . Arthritis     Possible RA - Follow up appt with Dr. Jefm Bryant  . H/O transfusion of packed red blood cells 02/2013    3 units PRBC for severe anemia 02/2013  . Anemia 02/2013    F/U with Dr. Grayland Ormond for Feraheme injections  . Hyperlipidemia   . Allergy   . History of kidney stones   . Migraine   . Cellulitis of arm, right   . Paroxysmal a-fib ([place])   . Diabetes insipidus ([place])     On desmopressin  . H/O seasonal allergies   . Anxiety   . GERD (gastroesophageal reflux disease)   . Chronic kidney disease     Past Surgical History  Procedure Laterality Date  . Tubal ligation    . Cervical spine surgery      Bone fusion  . Hammer toe surgery    . Abdominal hysterectomy    . Breast surgery  1990s    Biopsy  . Breast excisional biopsy Right September 13, 2003    neg  . Hernia repair Left     Inguinal Hernia Repair  . Robotic assited partial nephrectomy Right 10/23/2015    Procedure: ROBOTIC ASSITED PARTIAL NEPHRECTOMY with intraop ultrasound;  Surgeon: Hollice Espy, MD;   Location: [facility] ORS;  Service: Urology;  Laterality: Right;    Filed Vitals:   01/25/16 1142  BP: 155/76  Pulse: 52  SpO2: 100%         Subjective Assessment - 01/25/16 1123    Subjective Deconditioning   Pertinent History Pt reports that on March she had kidney surgery where they removed a cancerous mass from her R kidney. Pt reports she had a bad reaction to morphine and it "worked on my system." She has felt weak since that time. She spent approximately 2 weeks in the hospital and then spent 2 weeks in a skilled nursing facility ([place]). She had shingles while at the SNF on her R abdomen under R breast and radiating around to her back. She still has pain associated with the shingles for which she has had mild improvement with gabapentin. She reports she has a history of rheumatoid arthritis and she sees Dr. Jefm Bryant at [facility]. Pt states that she has a history of anemia as well. They have been unable to identify a source for the anemia. She had blood work on Tuesday (chart review shows H/H at 12.7/37.3) and has a follow-up appointment on March 29, 2016 for additional bloodwork  and to see Dr. Lurline Del. Pt reports she has had a bone marrow biopsy in the past which was negative. She complains that if she sits for an extended period of time she gets numbness in both legs. When she first stands up she say she feels like her "knees want to slip out of place." Pt also complains of getting quickly out of breath with activity. She was referred to physical therapy for strengthening due to deconditioning and weakness   Currently in Pain? Yes  Post-herpatic neuralgia, not related to referral            [facility] PT Assessment - 01/25/16 1126    Assessment   Medical Diagnosis Generalized weakness (R53.1)   Referring Provider Deborra Medina   Onset Date/Surgical Date 10/23/15   Hand Dominance Right   Next MD Visit 03/21/16 with Dr. Grayland Ormond   Prior Therapy Pt received physical therapy at  [facility]   Precautions   Precautions Fall   Restrictions   Weight Bearing Restrictions No   Balance Screen   Has the patient fallen in the past 6 months No   Has the patient had a decrease in activity level because of a fear of falling?  Yes   Is the patient reluctant to leave their home because of a fear of falling?  No   Home Social worker Private residence   Living Arrangements Alone   Available Help at Discharge Other (Comment)  Lives at independent living at [place] One level   [place] - single point;Walker - 4 wheels;Shower seat;Grab bars - tub/shower   Prior Function   Level of Independence Independent with basic ADLs;Independent with community mobility with device;Requires assistive device for independence;Other (comment)  Facility supplies portion of meals   Cognition   Overall Cognitive Status Within Functional Limits for tasks assessed   Observation/Other Assessments   Other Surveys  Other Surveys   Lower Extremity Functional Scale  30/80   Sensation   Light Touch Appears Intact   Additional Comments Pt reports history of neuropathy on ventral aspect of feet   ROM / Strength   AROM / PROM / Strength Strength;AROM   AROM   Overall AROM Comments Mild limitation in overhead shoulder flexion. Pt reports history of cervical fusion 30 years ago. Otherwise UE/LE AROM grossly [facility]   Strength   Overall Strength Comments Grossly 4 to 4+/5 throughout UE/LE except for 4-/5 hip flexion. Hip abduction, extension, or rotation not assessed. Functional weakness noted with sit to stand transfers. Elects to utilize UE support for sit to stand   Transfers   Comments Utilizes UE for sit to stand   Ambulation/Gait   Gait Comments Pt ambulates with decreased gait speed utilizing single point cane. Gait is mildly antalgic and pt easily fatigued.    6 Minute Walk- Baseline   6  Minute Walk- Baseline yes   BP (mmHg) 155/76 mmHg   HR (bpm) 52   02 Sat (%RA) 100 %   6 Minute walk- Post Test   6 Minute Walk Post Test yes   BP (mmHg) (!) 172/98 mmHg   HR (bpm) 87   02 Sat (%RA) 97 %   Modified Borg Scale for Dyspnea 5- Strong or hard breathing   Perceived Rate of Exertion (Borg) 12-   6 minute walk test results    Aerobic Endurance Distance  Walked 575   Endurance additional comments Pt requires 1 seated rest breaks at the 1:30 mark for 30-45 seconds. She appears very fatigued on visual assessment and becomes very frustrated with her lack of endurance   Standardized Balance Assessment   Standardized Balance Assessment Timed Up and Go Test;Five Times Sit to Stand;10 meter walk test   Five times sit to stand comments  25.88 seconds   10 Meter Walk Deferred   Timed Up and Go Test   TUG Normal TUG   Normal TUG (seconds) 16.8      Objective:  After 6MWT BP was elevated. Pt placed in dimmed room in supine position and asked to rest. Vitals obtained every 5 minutes. BP: 190/110 after 10 minutes R arm manual, difficult to obtain accurately on L arm. Repeated with Dynamap 5 minutes latera and found to be 197/94 on R arm. 5 minutes later: 198/104 on R arm manually checked and 199/96 on Dynamap. Final reading was 209/103 on dynamap just before pt exited the office. Dr. Lupita Dawn office was contacted while patient resting and arranged a nursing visit for BP check at 2:15 PM. Pt notified and agrees to go to MD office. Pt educated about signs/symptoms of CVA and encouraged to call 911 if she has any issues. Pt in agreement and very thankful for the care.                      PT Education - 01/25/16 1414    Education provided Yes   Education Details Plan of care   Person(s) Educated Patient   Methods Explanation   Comprehension Verbalized understanding             PT Long Term Goals - 01/25/16 1423    PT LONG TERM GOAL #1   Title Pt will be independent  with HEP to improve strength, cardiopulmonary endurance, and balance in order to improve function at home    Time 8   Period Weeks   Status New   PT LONG TERM GOAL #2   Title Pt will decreased 5TSTS by at least 5 seconds in order to demonstrate improved LE strength and decrease fall risk   Baseline 01/25/16: 25.88 seconds   Time 8   Period Weeks   Status New   PT LONG TERM GOAL #3   Title Pt will decrease TUG to less than 14 seconds in order to demonstrate improved balance and strength to reduce risk for falls and improve function   Baseline 01/25/16: 16.8 seconds   Time 8   Period Weeks   Status New               Plan - 01/25/16 1414    Clinical Impression Statement Pt was referred to physical therapy for strengthening due to deconditioning and weakness s/p removal of R kidney mass in March 2017. Pt had an extended period of debility with multiple weeks in hospital and rehab. She now reports that she is easily fatigued and feels week. She is being monitored for anemia and recent bloodwork from this week shows H/H WNL. PT evaluation shows decreased LE strength and endurance with increased 5TSTS and TUG scores. Her 6MWT is 42' which is below norms for age/gender demonstrating decreased endurance. Pt easily fatigued and has to take a seated rest break during test. Post 6MWT vitals reveal elevated BP. Pt placed in supine position with lights dimmed and BP taken every 5 minutes. BP remains elevated with systolic readings 789'F  and diastolic readings 86-578. Otherwise HR and SaO2 WNL. Pt reports mild headache. MD office contacted who agree to see patient for BP check the same afternoon at 2:15. Pt discharged with instructions to follow-up with MD office that afternoon. Pt will benefit from skilled PT services to address deficits in strength, endurance, and balance in order to improve function at home.    Rehab Potential Good   Clinical Impairments Affecting Rehab Potential Positive:  motivation, Negative: recent surgery for cancerous mass on kidney, history of anemia, poor tolerance to 6MWT   PT Frequency 2x / week   PT Duration 8 weeks   PT Treatment/Interventions Aquatic Therapy;Electrical Stimulation;Moist Heat;Traction;Ultrasound;DME Instruction;Stair training;Functional mobility training;Gait training;Therapeutic activities;Therapeutic exercise;Balance training;Neuromuscular re-education;Patient/family education;Manual techniques;Energy conservation   PT Next Visit Plan Issue HEP after assessing tolerance for exercise, progress strengthening, BERG to assess balance   Consulted and Agree with Plan of Care Patient      Patient will benefit from skilled therapeutic intervention in order to improve the following deficits and impairments:  Abnormal gait, Decreased balance, Decreased endurance, Decreased activity tolerance, Decreased mobility, Decreased strength, Difficulty walking  Visit Diagnosis: Difficulty in walking, not elsewhere classified - Plan: PT plan of care cert/re-cert  Muscle weakness (generalized) - Plan: PT plan of care cert/re-cert      G-Codes - 46/96/29 1427    Functional Assessment Tool Used clinical judgement, TUG, 5TSTS, LEFS   Functional Limitation Mobility: Walking and moving around   Mobility: Walking and Moving Around Current Status (B2841) At least 40 percent but less than 60 percent impaired, limited or restricted   Mobility: Walking and Moving Around Goal Status [phone]) At least 20 percent but less than 40 percent impaired, limited or restricted       Problem List Patient Active Problem List   Diagnosis Date Noted  . Generalized weakness 01/13/2016  . Atrial fibrillation with RVR ([place]) 10/28/2015  . Renal cell carcinoma of right kidney ([place]) 10/23/2015  . Preoperative evaluation of a medical condition to rule out surgical contraindications (TAR required) 10/12/2015  . Renal mass 10/03/2015  . Urinary frequency 07/28/2015  .  Arthritis 05/10/2015  . Ache in joint 05/10/2015  . Nocturia 04/16/2015  . Neutropenia ([place]) 04/16/2015  . Hyperlipidemia 12/17/2014  . Depression 12/14/2014  . Xerostomia 11/14/2014  . Hyperglycemia 06/12/2014  . Diabetes insipidus ([place]) 06/12/2014  . S/P hysterectomy 06/09/2014  . Elevated CK 03/02/2014  . Decreased leukocytes 03/02/2014  . Medication management 11/11/2013  . Atherosclerotic peripheral vascular disease with intermittent claudication ([place]) 10/07/2013  . Edema of both legs 10/07/2013  . Venous stasis dermatitis 10/07/2013  . Chest pain 03/26/2013  . Left carotid bruit 03/26/2013  . Acquired pancytopenia ([place]) 03/20/2013  . Polyarthralgia 03/20/2013  . Hypothyroidism 03/10/2013  . Bradycardia 12/09/2012  . Cervical vertebral fusion 12/09/2012  . Gastrointestinal hemorrhage 12/09/2012  . Effusion of knee 12/09/2012  . Neuropathy ([place]) 12/09/2012  . Diuresis excessive 12/09/2012  . Compulsive tobacco user syndrome 12/09/2012  . Decreased body weight 12/09/2012  . Blush 12/09/2012  . Current tobacco use 12/09/2012  . Abnormal weight loss 12/09/2012  . IBS (irritable bowel syndrome)   . Degenerative arthritis of lumbar spine   . MURMUR 09/27/2009  . Postablative hypothyroidism 05/15/2009  . Anxiety state 05/15/2009  . Essential hypertension 05/15/2009   Phillips Grout PT, DPT   Huprich,Jason 01/25/2016, 5:02 PM  [facility] [address] [place], [address] Phone: [phone]  Fax:  [phone]  Name: AVIANCE COOPERWOOD MRN: [id number] Date of Birth: 1941/04/18

## 2016-01-25 NOTE — Progress Notes (Signed)
I consulted with Dr. Derrel Nip after checking patients vitals & reviewing her medications & chief complaint. Pt BP was 150/104 bilaterally.   Per Derrel Nip, pt has been instructed to take a second Losartan  '50mg'$  today & take a total of '100mg'$  daily. Pt BP was elevated at last visit as well. Pt will spot check her BP & send [place] readings next week. Pt will be travelling to the [place] this weekend.

## 2016-01-25 NOTE — Progress Notes (Signed)
Plan per PCP.  [place]

## 2016-01-25 NOTE — Telephone Encounter (Signed)
She should take an extra dose of losartan for a total of 100 mg daily . She is currently taking 50 mg daily Her BP was elevated when she was here two weeks ago so she may need that higher dose on a regular basis

## 2016-01-25 NOTE — Telephone Encounter (Signed)
Patient at PT initial evaluation of patient 165/76 pulse 72 after physical therapy walk on tread mill patient BP increased and after 25 minutes of rest in dark room patient bp 199/96 P 72 , schedule patient a BP check in office this afternoon at 2.15 to make sure pressure is coming down.

## 2016-01-26 ENCOUNTER — Encounter: Payer: Self-pay | Admitting: Internal Medicine

## 2016-01-26 MED ORDER — ALPRAZOLAM 0.25 MG PO TABS: 0.2500 mg | ORAL_TABLET | Freq: Two times a day (BID) | ORAL | Status: DC | PRN

## 2016-01-26 NOTE — Telephone Encounter (Signed)
Ok to refill,  Refill printed

## 2016-01-26 NOTE — Telephone Encounter (Signed)
Pt called to follow up on the Rx.   Call pt @ [phone]. Thank you!

## 2016-01-28 NOTE — Progress Notes (Signed)
  I have reviewed the above information and agree with above.   Didi Ganaway, MD 

## 2016-01-29 ENCOUNTER — Ambulatory Visit: Payer: PPO | Admitting: Physical Therapy

## 2016-01-30 ENCOUNTER — Telehealth: Payer: Self-pay | Admitting: Internal Medicine

## 2016-01-30 NOTE — Telephone Encounter (Signed)
FYI, thanks.

## 2016-01-30 NOTE — Telephone Encounter (Signed)
Just FYI patient wanted to know the adjustment medication has improved her BP. 144/78 pulse 58 [place] for the beach and will be out of town for one week. Cell [phone]

## 2016-01-30 NOTE — Telephone Encounter (Signed)
Pt was in last Thursday to see Dr. Derrel Nip. Pt wanted her to know that by increasing her BP medication by 2 a day she has improved.

## 2016-01-31 ENCOUNTER — Other Ambulatory Visit: Payer: Self-pay | Admitting: Internal Medicine

## 2016-02-01 ENCOUNTER — Ambulatory Visit: Payer: PPO | Admitting: Physical Therapy

## 2016-02-05 ENCOUNTER — Ambulatory Visit: Payer: PPO | Attending: Internal Medicine | Admitting: Physical Therapy

## 2016-02-05 DIAGNOSIS — M6281 Muscle weakness (generalized): Secondary | ICD-10-CM | POA: Insufficient documentation

## 2016-02-05 DIAGNOSIS — R262 Difficulty in walking, not elsewhere classified: Secondary | ICD-10-CM | POA: Insufficient documentation

## 2016-02-08 ENCOUNTER — Ambulatory Visit: Payer: PPO | Admitting: Physical Therapy

## 2016-02-13 ENCOUNTER — Ambulatory Visit: Payer: PPO | Admitting: Physical Therapy

## 2016-02-15 ENCOUNTER — Ambulatory Visit: Payer: PPO | Admitting: Physical Therapy

## 2016-02-15 ENCOUNTER — Encounter: Payer: PPO | Admitting: Physical Therapy

## 2016-02-15 DIAGNOSIS — M6281 Muscle weakness (generalized): Secondary | ICD-10-CM

## 2016-02-15 DIAGNOSIS — R262 Difficulty in walking, not elsewhere classified: Secondary | ICD-10-CM

## 2016-02-15 NOTE — Therapy (Signed)
[facility] [address] [place], [address] Phone: [phone]   Fax:  [phone]  Physical Therapy Treatment  Patient Details  Name: Caroline Nichols MRN: [id number] Date of Birth: 04-19-1941 Referring Provider: Deborra Medina  Encounter Date: 02/15/2016      PT End of Session - 02/15/16 1117    Visit Number 2   Number of Visits 17   Date for PT Re-Evaluation 2016-03-23   Authorization Type g codes   PT Start Time Oct 04, 1106   PT Stop Time 1147   PT Time Calculation (min) 39 min   Equipment Utilized During Treatment Gait belt   Activity Tolerance Patient tolerated treatment well   Behavior During Therapy [facility] for tasks assessed/performed      Past Medical History  Diagnosis Date  . Hypertension   . Hyperthyroidism     s/p radiation therapy, now hypothyroidism  . IBS (irritable bowel syndrome)   . Herniated disc   . Pancreatic cyst   . Arthritis     Possible RA - Follow up appt with Dr. Jefm Bryant  . H/O transfusion of packed red blood cells 02/2013    3 units PRBC for severe anemia 02/2013  . Anemia 02/2013    F/U with Dr. Grayland Ormond for Feraheme injections  . Hyperlipidemia   . Allergy   . History of kidney stones   . Migraine   . Cellulitis of arm, right   . Paroxysmal a-fib ([place])   . Diabetes insipidus ([place])     On desmopressin  . H/O seasonal allergies   . Anxiety   . GERD (gastroesophageal reflux disease)   . Chronic kidney disease     Past Surgical History  Procedure Laterality Date  . Tubal ligation    . Cervical spine surgery      Bone fusion  . Hammer toe surgery    . Abdominal hysterectomy    . Breast surgery  1990s    Biopsy  . Breast excisional biopsy Right 10-05-03    neg  . Hernia repair Left     Inguinal Hernia Repair  . Robotic assited partial nephrectomy Right 10/23/2015    Procedure: ROBOTIC ASSITED PARTIAL NEPHRECTOMY with intraop ultrasound;  Surgeon: Hollice Espy, MD;   Location: [facility] ORS;  Service: Urology;  Laterality: Right;    There were no vitals filed for this visit.      Subjective Assessment - 02/15/16 1113    Subjective Pt reports she has been active over the past few weeks. She has had an incr. in her blood pressure medication which has been maintaining her at lower more appropriate BP.   Pertinent History Pt reports that on March she had kidney surgery where they removed a cancerous mass from her R kidney. Pt reports she had a bad reaction to morphine and it "worked on my system." She has felt weak since that time. She spent approximately 2 weeks in the hospital and then spent 2 weeks in a skilled nursing facility ([place]). She had shingles while at the SNF on her R abdomen under R breast and radiating around to her back. She still has pain associated with the shingles for which she has had mild improvement with gabapentin. She reports she has a history of rheumatoid arthritis and she sees Dr. Jefm Bryant at [facility]. Pt states that she has a history of anemia as well. They have been unable to identify a source for the anemia. She had  blood work on Tuesday (chart review shows H/H at 12.7/37.3) and has a follow-up appointment on 03/21/16 for additional bloodwork and to see Dr. Lurline Del. Pt reports she has had a bone marrow biopsy in the past which was negative. She complains that if she sits for an extended period of time she gets numbness in both legs. When she first stands up she say she feels like her "knees want to slip out of place." Pt also complains of getting quickly out of breath with activity. She was referred to physical therapy for strengthening due to deconditioning and weakness               Objective: BP 150/79 Sit<>stand 3x10, pt required UE support for this. Initially she reported this was for balance but with performance it was apparent this was due to muscle weakness.  Monitored O2 and BP, no change with  this.  Amb 1x100' CGA with SPC, following this O2 dropped to 91%.  Short rest break, O2 improved to 94  Performed second bout of 1x100', O2 dropped to 88%, pt instructed in deep breathing to improve O2, in 3 min improved to 94.  Amb 1x50', O2 dropped to 86%, again with breathing pt improved to 94. Focus of rest of session on amb for short periods while actively monitoring O2, with cuing for breathing pt able to perform 1x200' maintianing O2 at greater than 90%.  Pt exremely fatigued following session. Based on discussion will schedule future appointments for 30 min only due to fatigue.                   PT Education - 02/15/16 1115    Education provided Yes   Education Details HEP   Person(s) Educated Patient   Methods Explanation   Comprehension Verbalized understanding             PT Long Term Goals - 01/25/16 1423    PT LONG TERM GOAL #1   Title Pt will be independent with HEP to improve strength, cardiopulmonary endurance, and balance in order to improve function at home    Time 8   Period Weeks   Status New   PT LONG TERM GOAL #2   Title Pt will decreased 5TSTS by at least 5 seconds in order to demonstrate improved LE strength and decrease fall risk   Baseline 01/25/16: 25.88 seconds   Time 8   Period Weeks   Status New   PT LONG TERM GOAL #3   Title Pt will decrease TUG to less than 14 seconds in order to demonstrate improved balance and strength to reduce risk for falls and improve function   Baseline 01/25/16: 16.8 seconds   Time 8   Period Weeks   Status New               Plan - 02/15/16 1146    Clinical Impression Statement Pt able to perform very limited routine today due to significant fatigue and O2 limitations. Will limit pt in the future to 30 min sessions due to significant fatigue. PT encouraged pt to minimize exercise at home which is significantly challenging as pt O2 dropped to 85 several times wtihin session. Will continue to  monitor for future instances of difficulty with this and to help pt self monitor. BP maintained at appropriate level throughout session.   Rehab Potential Poor   Clinical Impairments Affecting Rehab Potential Positive: motivation, Negative: recent surgery for cancerous mass on kidney, history of anemia, poor tolerance  to 6MWT   PT Frequency 2x / week   PT Duration 8 weeks   PT Treatment/Interventions Aquatic Therapy;Electrical Stimulation;Moist Heat;Traction;Ultrasound;DME Instruction;Stair training;Functional mobility training;Gait training;Therapeutic activities;Therapeutic exercise;Balance training;Neuromuscular re-education;Patient/family education;Manual techniques;Energy conservation   PT Next Visit Plan Issue HEP after assessing tolerance for exercise, progress strengthening, BERG to assess balance   Consulted and Agree with Plan of Care Patient      Patient will benefit from skilled therapeutic intervention in order to improve the following deficits and impairments:  Abnormal gait, Decreased balance, Decreased endurance, Decreased activity tolerance, Decreased mobility, Decreased strength, Difficulty walking  Visit Diagnosis: Difficulty in walking, not elsewhere classified  Muscle weakness (generalized)     Problem List Patient Active Problem List   Diagnosis Date Noted  . Generalized weakness 01/13/2016  . Atrial fibrillation with RVR ([place]) 10/28/2015  . Renal cell carcinoma of right kidney ([place]) 10/23/2015  . Preoperative evaluation of a medical condition to rule out surgical contraindications (TAR required) 10/12/2015  . Renal mass 10/03/2015  . Urinary frequency 07/28/2015  . Arthritis 05/10/2015  . Ache in joint 05/10/2015  . Nocturia 04/16/2015  . Neutropenia ([place]) 04/16/2015  . Hyperlipidemia 12/17/2014  . Depression 12/14/2014  . Xerostomia 11/14/2014  . Hyperglycemia 06/12/2014  . Diabetes insipidus ([place]) 06/12/2014  . S/P hysterectomy 06/09/2014  . Elevated  CK 03/02/2014  . Decreased leukocytes 03/02/2014  . Medication management 11/11/2013  . Atherosclerotic peripheral vascular disease with intermittent claudication ([place]) 10/07/2013  . Edema of both legs 10/07/2013  . Venous stasis dermatitis 10/07/2013  . Chest pain 03/26/2013  . Left carotid bruit 03/26/2013  . Acquired pancytopenia ([place]) 03/20/2013  . Polyarthralgia 03/20/2013  . Hypothyroidism 03/10/2013  . Bradycardia 12/09/2012  . Cervical vertebral fusion 12/09/2012  . Gastrointestinal hemorrhage 12/09/2012  . Effusion of knee 12/09/2012  . Neuropathy ([place]) 12/09/2012  . Diuresis excessive 12/09/2012  . Compulsive tobacco user syndrome 12/09/2012  . Decreased body weight 12/09/2012  . Blush 12/09/2012  . Current tobacco use 12/09/2012  . Abnormal weight loss 12/09/2012  . IBS (irritable bowel syndrome)   . Degenerative arthritis of lumbar spine   . MURMUR 09/27/2009  . Postablative hypothyroidism 05/15/2009  . Anxiety state 05/15/2009  . Essential hypertension 05/15/2009    Fisher,Benjamin PT DPT 02/15/2016, 11:58 AM  [facility] [address], [place], [address] Phone: [phone]   Fax:  [phone]  Name: Caroline Nichols MRN: [id number] Date of Birth: 1941/02/09

## 2016-02-20 ENCOUNTER — Ambulatory Visit: Payer: PPO | Admitting: Physical Therapy

## 2016-02-20 DIAGNOSIS — R262 Difficulty in walking, not elsewhere classified: Secondary | ICD-10-CM

## 2016-02-20 DIAGNOSIS — M6281 Muscle weakness (generalized): Secondary | ICD-10-CM

## 2016-02-21 NOTE — Therapy (Signed)
[facility] [address], [place], [address] Phone: [phone]   Fax:  [phone]  Physical Therapy Treatment  Patient Details  Name: Caroline Nichols MRN: [id number] Date of Birth: [birth year] Referring Provider: Deborra Medina  Encounter Date: 02/20/2016      PT End of Session - 02/20/16 1025    Visit Number 3   Number of Visits 17   Date for PT Re-Evaluation 04-05-2016   Authorization Type g codes   PT Start Time 0957   PT Stop Time 1035   PT Time Calculation (min) 38 min   Equipment Utilized During Treatment Gait belt   Activity Tolerance Patient tolerated treatment well   Behavior During Therapy [facility] for tasks assessed/performed      Past Medical History  Diagnosis Date  . Hypertension   . Hyperthyroidism     s/p radiation therapy, now hypothyroidism  . IBS (irritable bowel syndrome)   . Herniated disc   . Pancreatic cyst   . Arthritis     Possible RA - Follow up appt with Dr. Jefm Bryant  . H/O transfusion of packed red blood cells 02/2013    3 units PRBC for severe anemia 02/2013  . Anemia 02/2013    F/U with Dr. Grayland Ormond for Feraheme injections  . Hyperlipidemia   . Allergy   . History of kidney stones   . Migraine   . Cellulitis of arm, right   . Paroxysmal a-fib ([place])   . Diabetes insipidus ([place])     On desmopressin  . H/O seasonal allergies   . Anxiety   . GERD (gastroesophageal reflux disease)   . Chronic kidney disease     Past Surgical History  Procedure Laterality Date  . Tubal ligation    . Cervical spine surgery      Bone fusion  . Hammer toe surgery    . Abdominal hysterectomy    . Breast surgery  1990s    Biopsy  . Breast excisional biopsy Right 2005    neg  . Hernia repair Left     Inguinal Hernia Repair  . Robotic assited partial nephrectomy Right 10/23/2015    Procedure: ROBOTIC ASSITED PARTIAL NEPHRECTOMY with intraop ultrasound;  Surgeon: Hollice Espy, MD;   Location: [facility] ORS;  Service: Urology;  Laterality: Right;    There were no vitals filed for this visit.      Subjective Assessment - 02/20/16 1000    Subjective Pt reports no change since previous session no falls, she was fatigued following previous session but  no significant difficulties.   Pertinent History Pt reports that on March she had kidney surgery where they removed a cancerous mass from her R kidney. Pt reports she had a bad reaction to morphine and it "worked on my system." She has felt weak since that time. She spent approximately 2 weeks in the hospital and then spent 2 weeks in a skilled nursing facility ([place]). She had shingles while at the SNF on her R abdomen under R breast and radiating around to her back. She still has pain associated with the shingles for which she has had mild improvement with gabapentin. She reports she has a history of rheumatoid arthritis and she sees Dr. Jefm [facility] at [facility]. Pt states that she has a history of anemia as well. They have been unable to identify a source for the anemia. She had blood work on Tuesday (chart review shows H/H at 12.7/37.3) and  has a follow-up appointment on 03/21/16 for additional bloodwork and to see Dr. Lurline Del. Pt reports she has had a bone marrow biopsy in the past which was negative. She complains that if she sits for an extended period of time she gets numbness in both legs. When she first stands up she say she feels like her "knees want to slip out of place." Pt also complains of getting quickly out of breath with activity. She was referred to physical therapy for strengthening due to deconditioning and weakness                Objective: Seated ball squeeze, 3 sec holds, 3x8  Seated YTB hip abduction 3x10  Marching 3x10  Following marching noted incr. SOB - O2 dropped to 91.    Seated hip abduction taps 3x10.  Sit<>stand with UE support 3x10.  Amb in clinic 3x50' with SPC and CGA  for balance   Nu-step L1 x5 min, monitoring O2 throughout, with nu-step at 70 spm O2 unable to maintain at appropriate level, with cuing to drop to 40 spm pt able to perform appropriately.  With all exercises pt required cuing and education for performance and encouragement to continue despite fatigue, monitoring O2 and BP throughout. BP maintained at 150/85, O2 maintained above 90 at all times.                     PT Long Term Goals - 01/25/16 1423    PT LONG TERM GOAL #1   Title Pt will be independent with HEP to improve strength, cardiopulmonary endurance, and balance in order to improve function at home    Time 8   Period Weeks   Status New   PT LONG TERM GOAL #2   Title Pt will decreased 5TSTS by at least 5 seconds in order to demonstrate improved LE strength and decrease fall risk   Baseline 01/25/16: 25.88 seconds   Time 8   Period Weeks   Status New   PT LONG TERM GOAL #3   Title Pt will decrease TUG to less than 14 seconds in order to demonstrate improved balance and strength to reduce risk for falls and improve function   Baseline 01/25/16: 16.8 seconds   Time 8   Period Weeks   Status New               Plan - 02/20/16 0709    Clinical Impression Statement Pt participated with higher level exercises today - again required several rest breaks due to O2 dropping, however pt reported she had "more energy" throughout session. Pt reports she has access to a nu-step at her retirement facility and is willing to give it a try, she reports she used to perform this but recently has been unable to do so. Pt continues to demonstrate poor conditioning and some difficulty stabilizing O2 level with activity.   Rehab Potential Poor   Clinical Impairments Affecting Rehab Potential Positive: motivation, Negative: recent surgery for cancerous mass on kidney, history of anemia, poor tolerance to 6MWT   PT Frequency 2x / week   PT Duration 8 weeks   PT  Treatment/Interventions Aquatic Therapy;Electrical Stimulation;Moist Heat;Traction;Ultrasound;DME Instruction;Stair training;Functional mobility training;Gait training;Therapeutic activities;Therapeutic exercise;Balance training;Neuromuscular re-education;Patient/family education;Manual techniques;Energy conservation   PT Next Visit Plan Issue HEP after assessing tolerance for exercise, progress strengthening, BERG to assess balance   Consulted and Agree with Plan of Care Patient      Patient will benefit from skilled therapeutic intervention in  order to improve the following deficits and impairments:  Abnormal gait, Decreased balance, Decreased endurance, Decreased activity tolerance, Decreased mobility, Decreased strength, Difficulty walking  Visit Diagnosis: Muscle weakness (generalized)  Difficulty in walking, not elsewhere classified     Problem List Patient Active Problem List   Diagnosis Date Noted  . Generalized weakness 01/13/2016  . Atrial fibrillation with RVR ([place]) 10/28/2015  . Renal cell carcinoma of right kidney ([place]) 10/23/2015  . Preoperative evaluation of a medical condition to rule out surgical contraindications (TAR required) 10/12/2015  . Renal mass 10/03/2015  . Urinary frequency 07/28/2015  . Arthritis 05/10/2015  . Ache in joint 05/10/2015  . Nocturia 04/16/2015  . Neutropenia ([place]) 04/16/2015  . Hyperlipidemia 12/17/2014  . Depression 12/14/2014  . Xerostomia 11/14/2014  . Hyperglycemia 06/12/2014  . Diabetes insipidus ([place]) 06/12/2014  . S/P hysterectomy 06/09/2014  . Elevated CK 03/02/2014  . Decreased leukocytes 03/02/2014  . Medication management 11/11/2013  . Atherosclerotic peripheral vascular disease with intermittent claudication ([place]) 10/07/2013  . Edema of both legs 10/07/2013  . Venous stasis dermatitis 10/07/2013  . Chest pain 03/26/2013  . Left carotid bruit 03/26/2013  . Acquired pancytopenia ([place]) 03/20/2013  . Polyarthralgia  03/20/2013  . Hypothyroidism 03/10/2013  . Bradycardia 12/09/2012  . Cervical vertebral fusion 12/09/2012  . Gastrointestinal hemorrhage 12/09/2012  . Effusion of knee 12/09/2012  . Neuropathy ([place]) 12/09/2012  . Diuresis excessive 12/09/2012  . Compulsive tobacco user syndrome 12/09/2012  . Decreased body weight 12/09/2012  . Blush 12/09/2012  . Current tobacco use 12/09/2012  . Abnormal weight loss 12/09/2012  . IBS (irritable bowel syndrome)   . Degenerative arthritis of lumbar spine   . MURMUR 09/27/2009  . Postablative hypothyroidism 05/15/2009  . Anxiety state 05/15/2009  . Essential hypertension 05/15/2009    Fisher,Benjamin PT DPT 02/21/2016, 7:17 AM  [facility] [address], [place], [address] Phone: [phone]   Fax:  [phone]  Name: Caroline Nichols MRN: [id number] Date of Birth: [birth year]

## 2016-02-22 ENCOUNTER — Ambulatory Visit: Payer: PPO | Admitting: Physical Therapy

## 2016-02-22 DIAGNOSIS — M6281 Muscle weakness (generalized): Secondary | ICD-10-CM

## 2016-02-22 NOTE — Therapy (Signed)
[facility] [address] [place], [address] Phone: [phone]   Fax:  [phone]  Physical Therapy Treatment  Patient Details  Name: Caroline Nichols MRN: [id number] Date of Birth: 1941/06/10 Referring Provider: Deborra Medina  Encounter Date: 02/22/2016      PT End of Session - 02/22/16 0919    Visit Number 4   Number of Visits 17   Date for PT Re-Evaluation March 28, 2016   Authorization Type g codes   PT Start Time 0911   PT Stop Time 0935   PT Time Calculation (min) 24 min   Equipment Utilized During Treatment Gait belt   Activity Tolerance Patient tolerated treatment well   Behavior During Therapy [facility] for tasks assessed/performed      Past Medical History  Diagnosis Date  . Hypertension   . Hyperthyroidism     s/p radiation therapy, now hypothyroidism  . IBS (irritable bowel syndrome)   . Herniated disc   . Pancreatic cyst   . Arthritis     Possible RA - Follow up appt with Dr. Jefm Bryant  . H/O transfusion of packed red blood cells 02/2013    3 units PRBC for severe anemia 02/2013  . Anemia 02/2013    F/U with Dr. Grayland Ormond for Feraheme injections  . Hyperlipidemia   . Allergy   . History of kidney stones   . Migraine   . Cellulitis of arm, right   . Paroxysmal a-fib ([place])   . Diabetes insipidus ([place])     On desmopressin  . H/O seasonal allergies   . Anxiety   . GERD (gastroesophageal reflux disease)   . Chronic kidney disease     Past Surgical History  Procedure Laterality Date  . Tubal ligation    . Cervical spine surgery      Bone fusion  . Hammer toe surgery    . Abdominal hysterectomy    . Breast surgery  1990s    Biopsy  . Breast excisional biopsy Right 2003-09-19    neg  . Hernia repair Left     Inguinal Hernia Repair  . Robotic assited partial nephrectomy Right 10/23/2015    Procedure: ROBOTIC ASSITED PARTIAL NEPHRECTOMY with intraop ultrasound;  Surgeon: Hollice Espy, MD;   Location: [facility] ORS;  Service: Urology;  Laterality: Right;    There were no vitals filed for this visit.      Subjective Assessment - 02/22/16 0915    Subjective Pt reports no falls, she felt good after last session, inconsistent with HEP. she did try the nu-step at her home but had several questions regarding positioning for this.   Pertinent History Pt reports that on March she had kidney surgery where they removed a cancerous mass from her R kidney. Pt reports she had a bad reaction to morphine and it "worked on my system." She has felt weak since that time. She spent approximately 2 weeks in the hospital and then spent 2 weeks in a skilled nursing facility ([place]). She had shingles while at the SNF on her R abdomen under R breast and radiating around to her back. She still has pain associated with the shingles for which she has had mild improvement with gabapentin. She reports she has a history of rheumatoid arthritis and she sees Dr. Jefm [facility] at [facility]. Pt states that she has a history of anemia as well. They have been unable to identify a source for the anemia. She had blood work  on Tuesday (chart review shows H/H at 12.7/37.3) and has a follow-up appointment on 03/21/16 for additional bloodwork and to see Dr. Lurline Del. Pt reports she has had a bone marrow biopsy in the past which was negative. She complains that if she sits for an extended period of time she gets numbness in both legs. When she first stands up she say she feels like her "knees want to slip out of place." Pt also complains of getting quickly out of breath with activity. She was referred to physical therapy for strengthening due to deconditioning and weakness               Objective: Seated marching 3x10, interspersed with 100' amb in clinic with [facility] and [facility].  Following this O2 had dropped to 86. Instructed pt to perform deep breathing which improved to 96.  Ball squeezes, 3x10. Following this O2  dropped to 88. Based on continued difficulty with maintaining oxygen level PT discontinued session at this time.                    PT Education - 02/22/16 [phone]    Education provided Yes   Education Details positioning for nu-step   Person(s) Educated Patient   Methods Explanation             PT Long Term Goals - 01/25/16 1423    PT LONG TERM GOAL #1   Title Pt will be independent with HEP to improve strength, cardiopulmonary endurance, and balance in order to improve function at home    Time 8   Period Weeks   Status New   PT LONG TERM GOAL #2   Title Pt will decreased 5TSTS by at least 5 seconds in order to demonstrate improved LE strength and decrease fall risk   Baseline 01/25/16: 25.88 seconds   Time 8   Period Weeks   Status New   PT LONG TERM GOAL #3   Title Pt will decrease TUG to less than 14 seconds in order to demonstrate improved balance and strength to reduce risk for falls and improve function   Baseline 01/25/16: 16.8 seconds   Time 8   Period Weeks   Status New               Plan - 02/22/16 6283    Clinical Impression Statement Difficulty with performance of all activity today. resting O2 dropped to 86 one time, 88 one time. Based on pt fatigue ended session early. She believes this is due to her being significantly fatigued this morning. Educated pt on monitoring her O2 or SOB and left message for PCP regarding this.   Rehab Potential Poor   Clinical Impairments Affecting Rehab Potential Positive: motivation, Negative: recent surgery for cancerous mass on kidney, history of anemia, poor tolerance to 6MWT   PT Frequency 2x / week   PT Duration 8 weeks   PT Treatment/Interventions Aquatic Therapy;Electrical Stimulation;Moist Heat;Traction;Ultrasound;DME Instruction;Stair training;Functional mobility training;Gait training;Therapeutic activities;Therapeutic exercise;Balance training;Neuromuscular re-education;Patient/family education;Manual  techniques;Energy conservation   PT Next Visit Plan Issue HEP after assessing tolerance for exercise, progress strengthening, BERG to assess balance   Consulted and Agree with Plan of Care Patient      Patient will benefit from skilled therapeutic intervention in order to improve the following deficits and impairments:  Abnormal gait, Decreased balance, Decreased endurance, Decreased activity tolerance, Decreased mobility, Decreased strength, Difficulty walking  Visit Diagnosis: Muscle weakness (generalized)     Problem List Patient Active Problem List  Diagnosis Date Noted  . Generalized weakness 01/13/2016  . Atrial fibrillation with RVR ([place]) 10/28/2015  . Renal cell carcinoma of right kidney ([place]) 10/23/2015  . Preoperative evaluation of a medical condition to rule out surgical contraindications (TAR required) 10/12/2015  . Renal mass 10/03/2015  . Urinary frequency 07/28/2015  . Arthritis 05/10/2015  . Ache in joint 05/10/2015  . Nocturia 04/16/2015  . Neutropenia ([place]) 04/16/2015  . Hyperlipidemia 12/17/2014  . Depression 12/14/2014  . Xerostomia 11/14/2014  . Hyperglycemia 06/12/2014  . Diabetes insipidus ([place]) 06/12/2014  . S/P hysterectomy 06/09/2014  . Elevated CK 03/02/2014  . Decreased leukocytes 03/02/2014  . Medication management 11/11/2013  . Atherosclerotic peripheral vascular disease with intermittent claudication ([place]) 10/07/2013  . Edema of both legs 10/07/2013  . Venous stasis dermatitis 10/07/2013  . Chest pain 03/26/2013  . Left carotid bruit 03/26/2013  . Acquired pancytopenia ([place]) 03/20/2013  . Polyarthralgia 03/20/2013  . Hypothyroidism 03/10/2013  . Bradycardia 12/09/2012  . Cervical vertebral fusion 12/09/2012  . Gastrointestinal hemorrhage 12/09/2012  . Effusion of knee 12/09/2012  . Neuropathy ([place]) 12/09/2012  . Diuresis excessive 12/09/2012  . Compulsive tobacco user syndrome 12/09/2012  . Decreased body weight 12/09/2012  . Blush  12/09/2012  . Current tobacco use 12/09/2012  . Abnormal weight loss 12/09/2012  . IBS (irritable bowel syndrome)   . Degenerative arthritis of lumbar spine   . MURMUR 09/27/2009  . Postablative hypothyroidism 05/15/2009  . Anxiety state 05/15/2009  . Essential hypertension 05/15/2009    Fisher,Benjamin PT DPT 02/22/2016, 9:39 AM  [facility] [address] [place], [address] Phone: [phone]   Fax:  [phone]  Name: BERTHE OLEY MRN: [id number] Date of Birth: 1941/02/16

## 2016-02-27 ENCOUNTER — Ambulatory Visit: Payer: PPO | Admitting: Physical Therapy

## 2016-02-27 DIAGNOSIS — M6281 Muscle weakness (generalized): Secondary | ICD-10-CM | POA: Diagnosis not present

## 2016-02-27 NOTE — Therapy (Signed)
[facility] [id number], [place], [address] Phone: [phone]   Fax:  [phone]  Physical Therapy Treatment  Patient Details  Name: Caroline Nichols MRN: [id number] Date of Birth: [birth year] Referring Provider: Deborra Medina  Encounter Date: 02/27/2016      PT End of Session - 02/27/16 1036    Visit Number 5   Number of Visits 17   Date for PT Re-Evaluation 12-Apr-2016   Authorization Type g codes   PT Start Time Oct 24, 1028   PT Stop Time 1100   PT Time Calculation (min) 30 min   Equipment Utilized During Treatment Gait belt   Activity Tolerance Patient tolerated treatment well   Behavior During Therapy [facility] for tasks assessed/performed      Past Medical History:  Diagnosis Date  . Allergy   . Anemia 02/2013   F/U with Dr. Grayland Ormond for Feraheme injections  . Anxiety   . Arthritis    Possible RA - Follow up appt with Dr. Jefm Bryant  . Cellulitis of arm, right   . Chronic kidney disease   . Diabetes insipidus ([place])    On desmopressin  . GERD (gastroesophageal reflux disease)   . H/O seasonal allergies   . H/O transfusion of packed red blood cells 02/2013   3 units PRBC for severe anemia 02/2013  . Herniated disc   . History of kidney stones   . Hyperlipidemia   . Hypertension   . Hyperthyroidism    s/p radiation therapy, now hypothyroidism  . IBS (irritable bowel syndrome)   . Migraine   . Pancreatic cyst   . Paroxysmal a-fib [facility])     Past Surgical History:  Procedure Laterality Date  . ABDOMINAL HYSTERECTOMY    . BREAST EXCISIONAL BIOPSY Right 25-Oct-2003   neg  . BREAST SURGERY  1990s   Biopsy  . CERVICAL SPINE SURGERY     Bone fusion  . HAMMER TOE SURGERY    . HERNIA REPAIR Left    Inguinal Hernia Repair  . ROBOTIC ASSITED PARTIAL NEPHRECTOMY Right 10/23/2015   Procedure: ROBOTIC ASSITED PARTIAL NEPHRECTOMY with intraop ultrasound;  Surgeon: Hollice Espy, MD;  Location: [facility] ORS;  Service:  Urology;  Laterality: Right;  . TUBAL LIGATION      There were no vitals filed for this visit.      Subjective Assessment - 02/27/16 1032    Subjective Pt reports she is feeling better. no falls. Reports she has had good BP, averaging 120/65. reports she is doing better but is not back to "normal".   Pertinent History Pt reports that on March she had kidney surgery where they removed a cancerous mass from her R kidney. Pt reports she had a bad reaction to morphine and it "worked on my system." She has felt weak since that time. She spent approximately 2 weeks in the hospital and then spent 2 weeks in a skilled nursing facility ([place]). She had shingles while at the SNF on her R abdomen under R breast and radiating around to her back. She still has pain associated with the shingles for which she has had mild improvement with gabapentin. She reports she has a history of rheumatoid arthritis and she sees Dr. Jefm [facility] at [facility]. Pt states that she has a history of anemia as well. They have been unable to identify a source for the anemia. She had blood work on Tuesday (chart review shows H/H at 12.7/37.3) and has a follow-up  appointment on 03/21/16 for additional bloodwork and to see Dr. Lurline Del. Pt reports she has had a bone marrow biopsy in the past which was negative. She complains that if she sits for an extended period of time she gets numbness in both legs. When she first stands up she say she feels like her "knees want to slip out of place." Pt also complains of getting quickly out of breath with activity. She was referred to physical therapy for strengthening due to deconditioning and weakness   Currently in Pain? No/denies                   Objective: amb in clinic 3x200', monitoring O2 throughout. Pt demonstrates improved ability to perform, O2 remains high if pt is engaged in conversation or reminded to breathe, otherwise pt holds breath and O2 drops.  Stair  taps 3x1 min each side. UE for support.  Sit<>stand 3x10 with definite UE support.  Seated ball squeeze 3x10 with 3 sec. Holds. Educated pt on using pillow for performance at home.  Nu-step L2 x4 min monitoring O2 throughout.                   PT Long Term Goals - 01/25/16 1423      PT LONG TERM GOAL #1   Title Pt will be independent with HEP to improve strength, cardiopulmonary endurance, and balance in order to improve function at home    Time 8   Period Weeks   Status New     PT LONG TERM GOAL #2   Title Pt will decreased 5TSTS by at least 5 seconds in order to demonstrate improved LE strength and decrease fall risk   Baseline 01/25/16: 25.88 seconds   Time 8   Period Weeks   Status New     PT LONG TERM GOAL #3   Title Pt will decrease TUG to less than 14 seconds in order to demonstrate improved balance and strength to reduce risk for falls and improve function   Baseline 01/25/16: 16.8 seconds   Time 8   Period Weeks   Status New               Plan - 02/27/16 1043    Clinical Impression Statement Pt continues to be significantly fatigued throughout session and is unable to participate in 45 min session so continuing to perform shorter sessions. Monitored O2 throughout session and with cuing for breathing able to maintain above 90, however without this decr. to below 90 periodically. Look to progress exercise intensity at next session if pt is continuing to demonstrate improvement in exercise tolerance like she did today.   Rehab Potential Poor   Clinical Impairments Affecting Rehab Potential Positive: motivation, Negative: recent surgery for cancerous mass on kidney, history of anemia, poor tolerance to 6MWT   PT Frequency 2x / week   PT Duration 8 weeks   PT Treatment/Interventions Aquatic Therapy;Electrical Stimulation;Moist Heat;Traction;Ultrasound;DME Instruction;Stair training;Functional mobility training;Gait training;Therapeutic  activities;Therapeutic exercise;Balance training;Neuromuscular re-education;Patient/family education;Manual techniques;Energy conservation   PT Next Visit Plan Issue HEP after assessing tolerance for exercise, progress strengthening, BERG to assess balance   Consulted and Agree with Plan of Care Patient      Patient will benefit from skilled therapeutic intervention in order to improve the following deficits and impairments:  Abnormal gait, Decreased balance, Decreased endurance, Decreased activity tolerance, Decreased mobility, Decreased strength, Difficulty walking  Visit Diagnosis: Muscle weakness (generalized)     Problem List Patient Active Problem List  Diagnosis Date Noted  . Generalized weakness 01/13/2016  . Atrial fibrillation with RVR ([place]) 10/28/2015  . Renal cell carcinoma of right kidney ([place]) 10/23/2015  . Preoperative evaluation of a medical condition to rule out surgical contraindications (TAR required) 10/12/2015  . Renal mass 10/03/2015  . Urinary frequency 07/28/2015  . Arthritis 05/10/2015  . Ache in joint 05/10/2015  . Nocturia 04/16/2015  . Neutropenia ([place]) 04/16/2015  . Hyperlipidemia 12/17/2014  . Depression 12/14/2014  . Xerostomia 11/14/2014  . Hyperglycemia 06/12/2014  . Diabetes insipidus ([place]) 06/12/2014  . S/P hysterectomy 06/09/2014  . Elevated CK 03/02/2014  . Decreased leukocytes 03/02/2014  . Medication management 11/11/2013  . Atherosclerotic peripheral vascular disease with intermittent claudication ([place]) 10/07/2013  . Edema of both legs 10/07/2013  . Venous stasis dermatitis 10/07/2013  . Chest pain 03/26/2013  . Left carotid bruit 03/26/2013  . Acquired pancytopenia ([place]) 03/20/2013  . Polyarthralgia 03/20/2013  . Hypothyroidism 03/10/2013  . Bradycardia 12/09/2012  . Cervical vertebral fusion 12/09/2012  . Gastrointestinal hemorrhage 12/09/2012  . Effusion of knee 12/09/2012  . Neuropathy ([place]) 12/09/2012  . Diuresis excessive  12/09/2012  . Compulsive tobacco user syndrome 12/09/2012  . Decreased body weight 12/09/2012  . Blush 12/09/2012  . Current tobacco use 12/09/2012  . Abnormal weight loss 12/09/2012  . IBS (irritable bowel syndrome)   . Degenerative arthritis of lumbar spine   . MURMUR 09/27/2009  . Postablative hypothyroidism 05/15/2009  . Anxiety state 05/15/2009  . Essential hypertension 05/15/2009    Fisher,Benjamin PT DPT 02/27/2016, 11:12 AM  [place] [facility] [address], [place], [address] Phone: [phone]   Fax:  [phone]  Name: Caroline Nichols MRN: [id number] Date of Birth: November 08, 1940

## 2016-02-28 ENCOUNTER — Other Ambulatory Visit: Payer: Self-pay | Admitting: Internal Medicine

## 2016-02-28 MED ORDER — LOSARTAN POTASSIUM 100 MG PO TABS: 100.0000 mg | ORAL_TABLET | Freq: Every day | ORAL | 1 refills | Status: DC

## 2016-02-28 NOTE — Telephone Encounter (Signed)
Patient requesting to have a refill on her meds, per last nurse visit she was told to increase the losartan, I have changed to 2 a day, please advise, thanks

## 2016-02-28 NOTE — Telephone Encounter (Signed)
Pt called about needing a refill for losartan (COZAAR) 50 MG tablet. Pt states her BP has been doing much better. Pt has been taking two per Dr Derrel Nip.  Pharmacy is [facility], [place]  Call pt @ [phone] . Thank you!

## 2016-02-29 ENCOUNTER — Encounter: Payer: Self-pay | Admitting: Physical Therapy

## 2016-02-29 ENCOUNTER — Ambulatory Visit: Payer: PPO

## 2016-02-29 VITALS — BP 174/64 | HR 65

## 2016-02-29 DIAGNOSIS — R262 Difficulty in walking, not elsewhere classified: Secondary | ICD-10-CM

## 2016-02-29 DIAGNOSIS — M6281 Muscle weakness (generalized): Secondary | ICD-10-CM

## 2016-02-29 NOTE — Therapy (Signed)
[facility] [address] [place], [address] Phone: [phone]   Fax:  [phone]  Physical Therapy Treatment  Patient Details  Name: Caroline Nichols MRN: [id number] Date of Birth: July 06, 1941 Referring Provider: Deborra Medina  Encounter Date: 02/29/2016      PT End of Session - 02/29/16 1010    Visit Number 6   Number of Visits 17   Date for PT Re-Evaluation 03-26-2016   Authorization Type g codes   PT Start Time 2876   PT Stop Time 1100   PT Time Calculation (min) 45 min   Equipment Utilized During Treatment Gait belt   Activity Tolerance Patient tolerated treatment well   Behavior During Therapy [facility] for tasks assessed/performed      Past Medical History:  Diagnosis Date  . Allergy   . Anemia 02/2013   F/U with Dr. Grayland Ormond for Feraheme injections  . Anxiety   . Arthritis    Possible RA - Follow up appt with Dr. Jefm Bryant  . Cellulitis of arm, right   . Chronic kidney disease   . Diabetes insipidus ([place])    On desmopressin  . GERD (gastroesophageal reflux disease)   . H/O seasonal allergies   . H/O transfusion of packed red blood cells 02/2013   3 units PRBC for severe anemia 02/2013  . Herniated disc   . History of kidney stones   . Hyperlipidemia   . Hypertension   . Hyperthyroidism    s/p radiation therapy, now hypothyroidism  . IBS (irritable bowel syndrome)   . Migraine   . Pancreatic cyst   . Paroxysmal a-fib [facility])     Past Surgical History:  Procedure Laterality Date  . ABDOMINAL HYSTERECTOMY    . BREAST EXCISIONAL BIOPSY Right 2005   neg  . BREAST SURGERY  1990s   Biopsy  . CERVICAL SPINE SURGERY     Bone fusion  . HAMMER TOE SURGERY    . HERNIA REPAIR Left    Inguinal Hernia Repair  . ROBOTIC ASSITED PARTIAL NEPHRECTOMY Right 10/23/2015   Procedure: ROBOTIC ASSITED PARTIAL NEPHRECTOMY with intraop ultrasound;  Surgeon: Hollice Espy, MD;  Location: [facility] ORS;  Service:  Urology;  Laterality: Right;  . TUBAL LIGATION      Vitals:   02/29/16 1022  BP: (!) 174/64  Pulse: 65  SpO2: 100%        Subjective Assessment - 02/29/16 1010    Subjective Pt states she is feeling well on this date. No further falls since last appointment. Pt reports continued improvement in strength and energy. She is performing HEP and has no specific questions or concerns at this time.    Pertinent History Pt reports that on March she had kidney surgery where they removed a cancerous mass from her R kidney. Pt reports she had a bad reaction to morphine and it "worked on my system." She has felt weak since that time. She spent approximately 2 weeks in the hospital and then spent 2 weeks in a skilled nursing facility ([place]). She had shingles while at the SNF on her R abdomen under R breast and radiating around to her back. She still has pain associated with the shingles for which she has had mild improvement with gabapentin. She reports she has a history of rheumatoid arthritis and she sees Dr. Jefm [facility] at [facility]. Pt states that she has a history of anemia as well. They have been unable to identify a  source for the anemia. She had blood work on Tuesday (chart review shows H/H at 12.7/37.3) and has a follow-up appointment on 03/21/16 for additional bloodwork and to see Dr. Lurline Del. Pt reports she has had a bone marrow biopsy in the past which was negative. She complains that if she sits for an extended period of time she gets numbness in both legs. When she first stands up she say she feels like her "knees want to slip out of place." Pt also complains of getting quickly out of breath with activity. She was referred to physical therapy for strengthening due to deconditioning and weakness   Currently in Pain? No/denies        Objective:  Physical Performance LEFS: 31/80 TUG: 14.08 seconds 5TSTS: 16.02 seconds (25.88 at initial evaluation) BERG:  51/56  Ther-ex Nu-step L2 x 1 min (denies DOE, SaO2 96%, RPE (6-20): 8), SaO2 monitored throughout, resistance increased to L4 x 4 minutes and pt reports RPE: 13. SaO2 remains at or above 95% throughout, Pt reports 8/10 on BORG at end of NuStep, pt takes extended time for RR to decrease and SOB to resolve; Partial squats 2 x 10; Step-ups to 6" step alternating LE x 10 each; Stair taps x 1 min without UE support; HEP review with patient; Seated rest breaks provided between exercises due to fatigue;  Marland Kitchen                    [place] Adult PT Treatment/Exercise - 02/29/16 1041      Standardized Balance Assessment   Standardized Balance Assessment Berg Balance Test;Timed Up and Go Test     Berg Balance Test   Sit to Stand Able to stand without using hands and stabilize independently   Standing Unsupported Able to stand safely 2 minutes   Sitting with Back Unsupported but Feet Supported on Floor or Stool Able to sit safely and securely 2 minutes   Stand to Sit Sits safely with minimal use of hands   Transfers Able to transfer safely, minor use of hands   Standing Unsupported with Eyes Closed Able to stand 10 seconds safely   Standing Ubsupported with Feet Together Able to place feet together independently and stand 1 minute safely   From Standing, Reach Forward with Outstretched Arm Can reach forward >12 cm safely (5")   From Standing Position, Pick up Object from Floor Able to pick up shoe safely and easily   From Standing Position, Turn to Look Behind Over each Shoulder Looks behind from both sides and weight shifts well   Turn 360 Degrees Able to turn 360 degrees safely but slowly   Standing Unsupported, Alternately Place Feet on Step/Stool Able to stand independently and safely and complete 8 steps in 20 seconds   Standing Unsupported, One Foot in Front Able to plae foot ahead of the other independently and hold 30 seconds   Standing on One Leg Able to lift leg independently  and hold 5-10 seconds   Total Score 51     Timed Up and Go Test   TUG Normal TUG   Normal TUG (seconds) 14.08                PT Education - 02/29/16 1010    Education provided Yes   Education Details Technique for exercises, HEP reinforced   Person(s) Educated Patient   Methods Explanation   Comprehension Verbalized understanding             PT Long Term  Goals - 03/20/2016 1056      PT LONG TERM GOAL #1   Title Pt will be independent with HEP to improve strength, cardiopulmonary endurance, and balance in order to improve function at home    Time 8   Period Weeks   Status On-going     PT LONG TERM GOAL #2   Title Pt will decreased 5TSTS to below 12.6 seconds in order to demonstrate improved LE strength and decrease fall risk   Baseline 01/25/16: 25.88 seconds, 2016-03-20: 16.02 (goal was 5 second decrease, achieved and revised);    Time 8   Period Weeks   Status Revised     PT LONG TERM GOAL #3   Title Pt will decrease TUG to less than 14 seconds in order to demonstrate improved balance and strength to reduce risk for falls and improve function   Baseline 01/25/16: 16.8 seconds, 03-20-2016: 14.08 seconds   Time 8   Period Weeks   Status Partially Met     PT LONG TERM GOAL #4   Title Pt will improve single leg balance to >10 seconds and turning time to <4 seconds in order to demonstrate decreased fall risk;   Baseline 03-20-2016: Single leg stance 5-6 seconds, Turning >4 seconds;   Time 4   Period Weeks   Status New               Plan - 20-Mar-2016 1011    Clinical Impression Statement Pt demonstrates improvement in all of her outcome measures on this date. She still reports moderate lower extremity functional limitations on LEFS and 5TSTS is still above cutoff for falls. Pt able to tolerate increased intensity with exercise on this date. She will continue to benefit skilled PT services to address deficits in strength, endurance, and balance.    Rehab Potential  Poor   Clinical Impairments Affecting Rehab Potential Positive: motivation, Negative: recent surgery for cancerous mass on kidney, history of anemia, poor tolerance to 6MWT   PT Frequency 2x / week   PT Duration 8 weeks   PT Treatment/Interventions Aquatic Therapy;Electrical Stimulation;Moist Heat;Traction;Ultrasound;DME Instruction;Stair training;Functional mobility training;Gait training;Therapeutic activities;Therapeutic exercise;Balance training;Neuromuscular re-education;Patient/family education;Manual techniques;Energy conservation   PT Next Visit Plan Continue to progress strengthening and balance exercise intensity   PT Home Exercise Plan As prescribed   Consulted and Agree with Plan of Care Patient      Patient will benefit from skilled therapeutic intervention in order to improve the following deficits and impairments:  Abnormal gait, Decreased balance, Decreased endurance, Decreased activity tolerance, Decreased mobility, Decreased strength, Difficulty walking  Visit Diagnosis: Muscle weakness (generalized)  Difficulty in walking, not elsewhere classified       G-Codes - Mar 20, 2016 1123    Functional Assessment Tool Used clinical judgement, TUG, 5TSTS, LEFS   Functional Limitation Mobility: Walking and moving around   Mobility: Walking and Moving Around Current Status [phone]) At least 20 percent but less than 40 percent impaired, limited or restricted   Mobility: Walking and Moving Around Goal Status [phone]) At least 20 percent but less than 40 percent impaired, limited or restricted      Problem List Patient Active Problem List   Diagnosis Date Noted  . Generalized weakness 01/13/2016  . Atrial fibrillation with RVR ([place]) 10/28/2015  . Renal cell carcinoma of right kidney ([place]) 10/23/2015  . Preoperative evaluation of a medical condition to rule out surgical contraindications (TAR required) 10/12/2015  . Renal mass 10/03/2015  . Urinary frequency 07/28/2015  .  Arthritis  05/10/2015  . Ache in joint 05/10/2015  . Nocturia 04/16/2015  . Neutropenia ([place]) 04/16/2015  . Hyperlipidemia 12/17/2014  . Depression 12/14/2014  . Xerostomia 11/14/2014  . Hyperglycemia 06/12/2014  . Diabetes insipidus ([place]) 06/12/2014  . S/P hysterectomy 06/09/2014  . Elevated CK 03/02/2014  . Decreased leukocytes 03/02/2014  . Medication management 11/11/2013  . Atherosclerotic peripheral vascular disease with intermittent claudication ([place]) 10/07/2013  . Edema of both legs 10/07/2013  . Venous stasis dermatitis 10/07/2013  . Chest pain 03/26/2013  . Left carotid bruit 03/26/2013  . Acquired pancytopenia ([place]) 03/20/2013  . Polyarthralgia 03/20/2013  . Hypothyroidism 03/10/2013  . Bradycardia 12/09/2012  . Cervical vertebral fusion 12/09/2012  . Gastrointestinal hemorrhage 12/09/2012  . Effusion of knee 12/09/2012  . Neuropathy ([place]) 12/09/2012  . Diuresis excessive 12/09/2012  . Compulsive tobacco user syndrome 12/09/2012  . Decreased body weight 12/09/2012  . Blush 12/09/2012  . Current tobacco use 12/09/2012  . Abnormal weight loss 12/09/2012  . IBS (irritable bowel syndrome)   . Degenerative arthritis of lumbar spine   . MURMUR 09/27/2009  . Postablative hypothyroidism 05/15/2009  . Anxiety state 05/15/2009  . Essential hypertension 05/15/2009   Phillips Grout PT, DPT   Huprich,Jason 02/29/2016, 11:26 AM  [facility] [address], [place], [address] Phone: [phone]   Fax:  [phone]  Name: Caroline Nichols MRN: [id number] Date of Birth: May 15, 1941

## 2016-03-04 ENCOUNTER — Encounter: Payer: PPO | Admitting: Physical Therapy

## 2016-03-05 ENCOUNTER — Ambulatory Visit: Payer: PPO | Attending: Internal Medicine | Admitting: Physical Therapy

## 2016-03-05 DIAGNOSIS — M6281 Muscle weakness (generalized): Secondary | ICD-10-CM | POA: Diagnosis not present

## 2016-03-05 NOTE — Therapy (Signed)
[facility] [address], [place], [address] Phone: [phone]   Fax:  [phone]  Physical Therapy Treatment  Patient Details  Name: Caroline Nichols MRN: [id number] Date of Birth: 1940/08/23 Referring Provider: Deborra Medina  Encounter Date: 03/05/2016      PT End of Session - 03/05/16 0905    Visit Number 6   Number of Visits 17   Date for PT Re-Evaluation 2016/03/22   Authorization Type g codes   PT Start Time 0829   PT Stop Time 0900   PT Time Calculation (min) 31 min   Equipment Utilized During Treatment Gait belt   Activity Tolerance Patient tolerated treatment well   Behavior During Therapy [facility] for tasks assessed/performed      Past Medical History:  Diagnosis Date  . Allergy   . Anemia 02/2013   F/U with Dr. Grayland Ormond for Feraheme injections  . Anxiety   . Arthritis    Possible RA - Follow up appt with Dr. Jefm Bryant  . Cellulitis of arm, right   . Chronic kidney disease   . Diabetes insipidus ([place])    On desmopressin  . GERD (gastroesophageal reflux disease)   . H/O seasonal allergies   . H/O transfusion of packed red blood cells 02/2013   3 units PRBC for severe anemia 02/2013  . Herniated disc   . History of kidney stones   . Hyperlipidemia   . Hypertension   . Hyperthyroidism    s/p radiation therapy, now hypothyroidism  . IBS (irritable bowel syndrome)   . Migraine   . Pancreatic cyst   . Paroxysmal a-fib [facility])     Past Surgical History:  Procedure Laterality Date  . ABDOMINAL HYSTERECTOMY    . BREAST EXCISIONAL BIOPSY Right 2003-09-13   neg  . BREAST SURGERY  1990s   Biopsy  . CERVICAL SPINE SURGERY     Bone fusion  . HAMMER TOE SURGERY    . HERNIA REPAIR Left    Inguinal Hernia Repair  . ROBOTIC ASSITED PARTIAL NEPHRECTOMY Right 10/23/2015   Procedure: ROBOTIC ASSITED PARTIAL NEPHRECTOMY with intraop ultrasound;  Surgeon: Hollice Espy, MD;  Location: [facility] ORS;  Service:  Urology;  Laterality: Right;  . TUBAL LIGATION      There were no vitals filed for this visit.      Subjective Assessment - 03/05/16 0833    Subjective Pt is pleased with her progress so far. she is prepared for next session to be her last session.   Pertinent History Pt reports that on March she had kidney surgery where they removed a cancerous mass from her R kidney. Pt reports she had a bad reaction to morphine and it "worked on my system." She has felt weak since that time. She spent approximately 2 weeks in the hospital and then spent 2 weeks in a skilled nursing facility ([place]). She had shingles while at the SNF on her R abdomen under R breast and radiating around to her back. She still has pain associated with the shingles for which she has had mild improvement with gabapentin. She reports she has a history of rheumatoid arthritis and she sees Dr. Jefm [facility] at [facility]. Pt states that she has a history of anemia as well. They have been unable to identify a source for the anemia. She had blood work on Tuesday (chart review shows H/H at 12.7/37.3) and has a follow-up appointment on 2016/03/22 for additional bloodwork and to  see Dr. Lurline Del. Pt reports she has had a bone marrow biopsy in the past which was negative. She complains that if she sits for an extended period of time she gets numbness in both legs. When she first stands up she say she feels like her "knees want to slip out of place." Pt also complains of getting quickly out of breath with activity. She was referred to physical therapy for strengthening due to deconditioning and weakness                  Objective: Monitored O2 at all times, maintained greater than 95%.  Sit<>stand 3x15, requiring UE support and extended rest break following this.  amb in clinic 3x100' with no AD. SBA  Heel raises 3x15 knees straight, 3x15 knees bent  Abduction taps 3x10 each side, extension taps 3x10 each side.  Step  ups on 4" step 3x10, definite requirement for UE support for these exercises.  Pt became fatigued at this time and requested to end session. PT encouraged pt to attempt nu-step, performed L1 x3 min ( no charge)                    PT Long Term Goals - 02/29/16 1056      PT LONG TERM GOAL #1   Title Pt will be independent with HEP to improve strength, cardiopulmonary endurance, and balance in order to improve function at home    Time 8   Period Weeks   Status On-going     PT LONG TERM GOAL #2   Title Pt will decreased 5TSTS to below 12.6 seconds in order to demonstrate improved LE strength and decrease fall risk   Baseline 01/25/16: 25.88 seconds, 02/29/16: 16.02 (goal was 5 second decrease, achieved and revised);    Time 8   Period Weeks   Status Revised     PT LONG TERM GOAL #3   Title Pt will decrease TUG to less than 14 seconds in order to demonstrate improved balance and strength to reduce risk for falls and improve function   Baseline 01/25/16: 16.8 seconds, 02/29/16: 14.08 seconds   Time 8   Period Weeks   Status Partially Met     PT LONG TERM GOAL #4   Title Pt will improve single leg balance to >10 seconds and turning time to <4 seconds in order to demonstrate decreased fall risk;   Baseline 02/29/16: Single leg stance 5-6 seconds, Turning >4 seconds;   Time 4   Period Weeks   Status New               Plan - 03/05/16 0906    Clinical Impression Statement Pt is continuing to have some weakness, however pt is continuing to tolerate incr. intensity with exercise, self reported incr. in activity level at home (pt reports she is walking around her housing area 5x per day, up from 2 prior to start of PT. Will look to d/c pt at next session.   Rehab Potential Poor   Clinical Impairments Affecting Rehab Potential Positive: motivation, Negative: recent surgery for cancerous mass on kidney, history of anemia, poor tolerance to 6MWT   PT Frequency 2x / week   PT  Duration 8 weeks   PT Treatment/Interventions Aquatic Therapy;Electrical Stimulation;Moist Heat;Traction;Ultrasound;DME Instruction;Stair training;Functional mobility training;Gait training;Therapeutic activities;Therapeutic exercise;Balance training;Neuromuscular re-education;Patient/family education;Manual techniques;Energy conservation   PT Next Visit Plan Continue to progress strengthening and balance exercise intensity   PT Home Exercise Plan As prescribed  Consulted and Agree with Plan of Care Patient      Patient will benefit from skilled therapeutic intervention in order to improve the following deficits and impairments:  Abnormal gait, Decreased balance, Decreased endurance, Decreased activity tolerance, Decreased mobility, Decreased strength, Difficulty walking  Visit Diagnosis: Muscle weakness (generalized)     Problem List Patient Active Problem List   Diagnosis Date Noted  . Generalized weakness 01/13/2016  . Atrial fibrillation with RVR ([place]) 10/28/2015  . Renal cell carcinoma of right kidney ([place]) 10/23/2015  . Preoperative evaluation of a medical condition to rule out surgical contraindications (TAR required) 10/12/2015  . Renal mass 10/03/2015  . Urinary frequency 07/28/2015  . Arthritis 05/10/2015  . Ache in joint 05/10/2015  . Nocturia 04/16/2015  . Neutropenia ([place]) 04/16/2015  . Hyperlipidemia 12/17/2014  . Depression 12/14/2014  . Xerostomia 11/14/2014  . Hyperglycemia 06/12/2014  . Diabetes insipidus ([place]) 06/12/2014  . S/P hysterectomy 06/09/2014  . Elevated CK 03/02/2014  . Decreased leukocytes 03/02/2014  . Medication management 11/11/2013  . Atherosclerotic peripheral vascular disease with intermittent claudication ([place]) 10/07/2013  . Edema of both legs 10/07/2013  . Venous stasis dermatitis 10/07/2013  . Chest pain 03/26/2013  . Left carotid bruit 03/26/2013  . Acquired pancytopenia ([place]) 03/20/2013  . Polyarthralgia 03/20/2013  .  Hypothyroidism 03/10/2013  . Bradycardia 12/09/2012  . Cervical vertebral fusion 12/09/2012  . Gastrointestinal hemorrhage 12/09/2012  . Effusion of knee 12/09/2012  . Neuropathy ([place]) 12/09/2012  . Diuresis excessive 12/09/2012  . Compulsive tobacco user syndrome 12/09/2012  . Decreased body weight 12/09/2012  . Blush 12/09/2012  . Current tobacco use 12/09/2012  . Abnormal weight loss 12/09/2012  . IBS (irritable bowel syndrome)   . Degenerative arthritis of lumbar spine   . MURMUR 09/27/2009  . Postablative hypothyroidism 05/15/2009  . Anxiety state 05/15/2009  . Essential hypertension 05/15/2009    Aleks Nawrot PT DPT 03/05/2016, 9:08 AM  [facility] [address], [place], [address] Phone: [phone]   Fax:  [phone]  Name: WARRENE KAPFER MRN: [id number] Date of Birth: 1940-10-04

## 2016-03-06 ENCOUNTER — Encounter: Payer: Self-pay | Admitting: Cardiology

## 2016-03-06 ENCOUNTER — Ambulatory Visit (INDEPENDENT_AMBULATORY_CARE_PROVIDER_SITE_OTHER): Payer: PPO | Admitting: Cardiology

## 2016-03-06 VITALS — BP 160/80 | HR 66 | Ht 62.0 in | Wt 138.2 lb

## 2016-03-06 DIAGNOSIS — R0602 Shortness of breath: Secondary | ICD-10-CM | POA: Diagnosis not present

## 2016-03-06 DIAGNOSIS — I10 Essential (primary) hypertension: Secondary | ICD-10-CM | POA: Diagnosis not present

## 2016-03-06 DIAGNOSIS — I499 Cardiac arrhythmia, unspecified: Secondary | ICD-10-CM

## 2016-03-06 DIAGNOSIS — R079 Chest pain, unspecified: Secondary | ICD-10-CM

## 2016-03-06 DIAGNOSIS — R0989 Other specified symptoms and signs involving the circulatory and respiratory systems: Secondary | ICD-10-CM

## 2016-03-06 MED ORDER — ASPIRIN 81 MG PO CHEW: 81.0000 mg | CHEWABLE_TABLET | Freq: Once | ORAL | Status: DC

## 2016-03-06 NOTE — Patient Instructions (Addendum)
Medication Instructions:  Your physician has recommended you make the following change in your medication:  1. Start Aspirin 81 mg Once daily  Testing/Procedures: Your physician has requested that you have a carotid duplex. This test is an ultrasound of the carotid arteries in your neck. It looks at blood flow through these arteries that supply the brain with blood. Allow one hour for this exam. There are no restrictions or special instructions.  Your physician has recommended that you wear an event monitor. Event monitors are medical devices that record the heart's electrical activity. Doctors most often [place] these monitors to diagnose arrhythmias. Arrhythmias are problems with the speed or rhythm of the heartbeat. The monitor is a small, portable device. You can wear one while you do your normal daily activities. This is usually used to diagnose what is causing palpitations/syncope (passing out).  [place]  Your caregiver has ordered a Stress Test with nuclear imaging. The purpose of this test is to evaluate the blood supply to your heart muscle. This procedure is referred to as a "Non-Invasive Stress Test." This is because other than having an IV started in your vein, nothing is inserted or "invades" your body. Cardiac stress tests are done to find areas of poor blood flow to the heart by determining the extent of coronary artery disease (CAD). Some patients exercise on a treadmill, which naturally increases the blood flow to your heart, while others who are  unable to walk on a treadmill due to physical limitations have a pharmacologic/chemical stress agent called Lexiscan . This medicine will mimic walking on a treadmill by temporarily increasing your coronary blood flow.   Please note: these test may take anywhere between 2-4 hours to complete  PLEASE REPORT TO [place] AT THE FIRST DESK WILL DIRECT YOU WHERE TO GO  Date of Procedure:__Wednesday March 13, 2016 at 65:30AM_  Arrival Time for Procedure:__Arrive at 07:15AM to register____   PLEASE NOTIFY THE OFFICE AT LEAST 24 HOURS IN ADVANCE IF YOU ARE UNABLE TO [place].  [phone] AND  PLEASE NOTIFY NUCLEAR MEDICINE AT [facility] AT LEAST 24 HOURS IN ADVANCE IF YOU ARE UNABLE TO KEEP YOUR APPOINTMENT. [phone]  How to prepare for your Myoview test:  1. Do not eat or drink after midnight 2. No caffeine for 24 hours prior to test 3. No smoking 24 hours prior to test. 4. Your medication may be taken with water.  If your doctor stopped a medication because of this test, do not take that medication. 5. Ladies, please do not wear dresses.  Skirts or pants are appropriate. Please wear a short sleeve shirt. 6. No perfume, cologne or lotion. 7. Wear comfortable walking shoes. No heels!  Follow-Up: Your physician recommends that you schedule a follow-up appointment in: 3 months with Dr. Yvone Neu.  It was a pleasure seeing you today here in the office. Please do not hesitate to give [place] a call back if you have any further questions. [place], BSN      Any Other Special Instructions Will Be Listed Below (If Applicable).     If you need a refill on your cardiac medications before your next appointment, please call your pharmacy.   Cardiac Event Monitoring A cardiac event monitor is a small recording device used to help detect abnormal heart rhythms (arrhythmias). The monitor is used to record heart rhythm when noticeable symptoms such as the following occur:  Fast heartbeats (palpitations), such  as heart racing or fluttering.  Dizziness.  Fainting or light-headedness.  Unexplained weakness. The monitor is wired to two electrodes placed on your chest. Electrodes are flat, sticky disks that attach to your skin. The monitor can be worn for up to 30 days. You will wear the monitor at all times, except when bathing.  HOW TO USE YOUR CARDIAC EVENT MONITOR A  technician will prepare your chest for the electrode placement. The technician will show you how to place the electrodes, how to work the monitor, and how to replace the batteries. Take time to practice using the monitor before you leave the office. Make sure you understand how to send the information from the monitor to your health care provider. This requires a telephone with a landline, not a cell phone. You need to:  Wear your monitor at all times, except when you are in water:  Do not get the monitor wet.  Take the monitor off when bathing. Do not swim or use a hot tub with it on.  Keep your skin clean. Do not put body lotion or moisturizer on your chest.  Change the electrodes daily or any time they stop sticking to your skin. You might need to use tape to keep them on.  It is possible that your skin under the electrodes could become irritated. To keep this from happening, try to put the electrodes in slightly different places on your chest. However, they must remain in the area under your left breast and in the upper right section of your chest.  Make sure the monitor is safely clipped to your clothing or in a location close to your body that your health care provider recommends.  Press the button to record when you feel symptoms of heart trouble, such as dizziness, weakness, light-headedness, palpitations, thumping, shortness of breath, unexplained weakness, or a fluttering or racing heart. The monitor is always on and records what happened slightly before you pressed the button, so do not worry about being too late to get good information.  Keep a diary of your activities, such as walking, doing chores, and taking medicine. It is especially important to note what you were doing when you pushed the button to record your symptoms. This will help your health care provider determine what might be contributing to your symptoms. The information stored in your monitor will be reviewed by your  health care provider alongside your diary entries.  Send the recorded information as recommended by your health care provider. It is important to understand that it will take some time for your health care provider to process the results.  Change the batteries as recommended by your health care provider. SEEK IMMEDIATE MEDICAL CARE IF:   You have chest pain.  You have extreme difficulty breathing or shortness of breath.  You develop a very fast heartbeat that persists.  You develop dizziness that does not go away.  You faint or constantly feel you are about to faint.   This information is not intended to replace advice given to you by your health care provider. Make sure you discuss any questions you have with your health care provider.   Document Released: 04/30/2008 Document Revised: 08/12/2014 Document Reviewed: 01/18/2013 Elsevier Interactive Patient Education 2016 [employer].    Pharmacologic Stress Electrocardiogram A pharmacologic stress electrocardiogram is a heart (cardiac) test that uses nuclear imaging to evaluate the blood supply to your heart. This test may also be called a pharmacologic stress electrocardiography. Pharmacologic means that a  medicine is used to increase your heart rate and blood pressure.  This stress test is done to find areas of poor blood flow to the heart by determining the extent of coronary artery disease (CAD). Some people exercise on a treadmill, which naturally increases the blood flow to the heart. For those people unable to exercise on a treadmill, a medicine is used. This medicine stimulates your heart and will cause your heart to beat harder and more quickly, as if you were exercising.  Pharmacologic stress tests can help determine:  The adequacy of blood flow to your heart during increased levels of activity in order to clear you for discharge home.  The extent of coronary artery blockage caused by CAD.  Your prognosis if you have  suffered a heart attack.  The effectiveness of cardiac procedures done, such as an angioplasty, which can increase the circulation in your coronary arteries.  Causes of chest pain or pressure. LET [facility] CARE PROVIDER KNOW ABOUT:  Any allergies you have.  All medicines you are taking, including vitamins, herbs, eye drops, creams, and over-the-counter medicines.  Previous problems you or members of your family have had with the use of anesthetics.  Any blood disorders you have.  Previous surgeries you have had.  Medical conditions you have.  Possibility of pregnancy, if this applies.  If you are currently breastfeeding. RISKS AND COMPLICATIONS Generally, this is a safe procedure. However, as with any procedure, complications can occur. Possible complications include:  You develop pain or pressure in the following areas:  Chest.  Jaw or neck.  Between your shoulder blades.  Radiating down your left arm.  Headache.  Dizziness or light-headedness.  Shortness of breath.  Increased or irregular heartbeat.  Low blood pressure.  Nausea or vomiting.  Flushing.  Redness going up the arm and slight pain during injection of medicine.  Heart attack (rare). BEFORE THE PROCEDURE   Avoid all forms of caffeine for 24 hours before your test or as directed by your health care provider. This includes coffee, tea (even decaffeinated tea), caffeinated sodas, chocolate, cocoa, and certain pain medicines.  Follow your health care provider's instructions regarding eating and drinking before the test.  Take your medicines as directed at regular times with water unless instructed otherwise. Exceptions may include:  If you have diabetes, ask how you are to take your insulin or pills. It is common to adjust insulin dosing the morning of the test.  If you are taking beta-blocker medicines, it is important to talk to your health care provider about these medicines well before the  date of your test. Taking beta-blocker medicines may interfere with the test. In some cases, these medicines need to be changed or stopped 24 hours or more before the test.  If you wear a nitroglycerin patch, it may need to be removed prior to the test. Ask your health care provider if the patch should be removed before the test.  If you use an inhaler for any breathing condition, bring it with you to the test.  If you are an outpatient, bring a snack so you can eat right after the stress phase of the test.  Do not smoke for 4 hours prior to the test or as directed by your health care provider.  Do not apply lotions, powders, creams, or oils on your chest prior to the test.  Wear comfortable shoes and clothing. Let your health care provider know if you were unable to complete or follow the  preparations for your test. PROCEDURE   Multiple patches (electrodes) will be put on your chest. If needed, small areas of your chest may be shaved to get better contact with the electrodes. Once the electrodes are attached to your body, multiple wires will be attached to the electrodes, and your heart rate will be monitored.  An IV access will be started. A nuclear trace (isotope) is given. The isotope may be given intravenously, or it may be swallowed. Nuclear refers to several types of radioactive isotopes, and the nuclear isotope lights up the arteries so that the nuclear images are clear. The isotope is absorbed by your body. This results in low radiation exposure.  A resting nuclear image is taken to show how your heart functions at rest.  A medicine is given through the IV access.  A second scan is done about 1 hour after the medicine injection and determines how your heart functions under stress.  During this stress phase, you will be connected to an electrocardiogram machine. Your blood pressure and oxygen levels will be monitored. AFTER THE PROCEDURE   Your heart rate and blood pressure will  be monitored after the test.  You may return to your normal schedule, including diet,activities, and medicines, unless your health care provider tells you otherwise.   This information is not intended to replace advice given to you by your health care provider. Make sure you discuss any questions you have with your health care provider.   Document Released: 12/08/2008 Document Revised: 07/27/2013 Document Reviewed: 03/29/2013 Elsevier Interactive Patient Education [employer].

## 2016-03-06 NOTE — Progress Notes (Signed)
Cardiology Office Note   Date:  03/06/2016   ID:  Caroline Nichols, DOB [birth year], MRN [id number]  Referring Doctor:  Caroline Mc, MD   Cardiologist:   Caroline Bushy, MD   Reason for consultation:  Chief Complaint  Patient presents with  . Other    Ref by Dr. Derrel Nip to establish care, last seen by Dr. Rockey Nichols 2 years ago. Pt. c/o chest pain and shortness of breath. Meds reviewed by the patient's med list.       History of Present Illness: Caroline Nichols is a 75 y.o. female who presents for Chest pain and shortness of breath.  Patient was last seen by Dr. Rockey Nichols in Aug 2014.   Chest pain has been going on for several months now. She describes it as a sharp pain in the center of her chest, unclear if it radiates anywhere else. Moderate in intensity, lasting a few seconds at a time. She does not know exactly what brings it on, not clearly exertional in nature. Not daily in occurrence.  She also has shortness of breath with minimal exertion, walking less than half of a block. Again this has been going on for quite some time now, mildly progressive in nature.  She denies fever, cough, colds, abdominal pain she had issues with bleeding from the nose while she was using Flonase but no other bleeding issues. No PND, orthopnea, edema palpitations. She does not complain of claudication. She is not able to walk significantly but what stops her is the shortness of breath.Other issues pertain to joint pains from arthritis.   ROS:  Please see the history of present illness. Aside from mentioned under HPI, all other systems are reviewed and negative.     Past Medical History:  Diagnosis Date  . Allergy   . Anemia 02/2013   F/U with Dr. Grayland Nichols for Feraheme injections  . Anxiety   . Arthritis    Possible RA - Follow up appt with Dr. Jefm Nichols  . Cellulitis of arm, right   . Chronic kidney disease   . Diabetes insipidus ([place])    On desmopressin  . GERD (gastroesophageal reflux disease)     . H/O seasonal allergies   . H/O transfusion of packed red blood cells 02/2013   3 units PRBC for severe anemia 02/2013  . Herniated disc   . History of kidney stones   . Hyperlipidemia   . Hypertension   . Hyperthyroidism    s/p radiation therapy, now hypothyroidism  . IBS (irritable bowel syndrome)   . Migraine   . Pancreatic cyst   . Paroxysmal a-fib [facility])     Past Surgical History:  Procedure Laterality Date  . ABDOMINAL HYSTERECTOMY    . BREAST EXCISIONAL BIOPSY Right 2005   neg  . BREAST SURGERY  1990s   Biopsy  . CERVICAL SPINE SURGERY     Bone fusion  . HAMMER TOE SURGERY    . HERNIA REPAIR Left    Inguinal Hernia Repair  . ROBOTIC ASSITED PARTIAL NEPHRECTOMY Right 10/23/2015   Procedure: ROBOTIC ASSITED PARTIAL NEPHRECTOMY with intraop ultrasound;  Surgeon: Caroline Espy, MD;  Location: [facility] ORS;  Service: Urology;  Laterality: Right;  . TUBAL LIGATION       reports that she quit smoking about 3 years ago. Her smoking use included Cigarettes. She has a 15.00 pack-year smoking history. She has never used smokeless tobacco. She reports that she does not drink alcohol or use drugs.  family history includes Diabetes in her brother; Heart disease in her father and mother; Kidney disease in her brother; Stroke in her daughter.   Outpatient Medications Prior to Visit  Medication Sig Dispense Refill  . acetaminophen (TYLENOL) 325 MG tablet Take 2 tablets (650 mg total) by mouth every 6 (six) hours as needed for pain.    Marland Kitchen ALPRAZolam (XANAX) 0.25 MG tablet Take 1 tablet (0.25 mg total) by mouth 2 (two) times daily as needed for anxiety. 60 tablet 2  . brimonidine (ALPHAGAN) 0.2 % ophthalmic solution Place 1 drop into both eyes 2 (two) times daily.    . Calcium-Vitamin D (SUPER CALCIUM/D) 600-125 MG-UNIT TABS Take by mouth. Taking 2 daily    . Cholecalciferol (D3-1000 PO) Take 1,000 Units by mouth daily. Reported on 07/28/2015    . ferrous sulfate 325 (65 FE) MG tablet Take  325 mg by mouth daily with breakfast.    . fluticasone (FLONASE) 50 MCG/ACT nasal spray Place 2 sprays into both nostrils daily. 16 g 6  . gabapentin (NEURONTIN) 100 MG capsule Take 1 capsule (100 mg total) by mouth 3 (three) times daily. 90 capsule 3  . gabapentin (NEURONTIN) 300 MG capsule Take 1 capsule (300 mg total) by mouth 3 (three) times daily. As needed for shingles pain 90 capsule 1  . hydrALAZINE (APRESOLINE) 10 MG tablet TAKE 1 TABLET (10 MG TOTAL) BY MOUTH 2 (TWO) TIMES DAILY. 90 tablet 0  . hydroxychloroquine (PLAQUENIL) 200 MG tablet Take 200 mg by mouth daily.     Marland Kitchen latanoprost (XALATAN) 0.005 % ophthalmic solution Place 1 drop into both eyes at bedtime.     Marland Kitchen levothyroxine (SYNTHROID, LEVOTHROID) 112 MCG tablet TAKE 1 TABLET (112 MCG TOTAL) BY MOUTH DAILY BEFORE BREAKFAST. 90 tablet 1  . losartan (COZAAR) 100 MG tablet Take 1 tablet (100 mg total) by mouth daily. NOTE DOSE CHANGE TO 100 MG DAILY 90 tablet 1  . mirabegron ER (MYRBETRIQ) 50 MG TB24 tablet Take 1 tablet (50 mg total) by mouth daily. 30 tablet 2  . mirtazapine (REMERON) 7.5 MG tablet TAKE 1 TABLET (7.5 MG TOTAL) BY MOUTH AT BEDTIME. 30 tablet 0  . nitroGLYCERIN (NITROLINGUAL) 0.4 MG/SPRAY spray Place 1 spray under the tongue every 5 (five) minutes x 3 doses as needed for chest pain. 4.9 g 11  . Omega-3 Fatty Acids (FISH OIL) 1000 MG CAPS Take 1,000 mg by mouth daily.     . pantoprazole (PROTONIX) 40 MG tablet TAKE 1 TABLET (40 MG TOTAL) BY MOUTH DAILY. 30 tablet 4  . vitamin C (ASCORBIC ACID) 500 MG tablet Take 500 mg by mouth daily.    . hydrALAZINE (APRESOLINE) 10 MG tablet TAKE 1 TABLET (10 MG TOTAL) BY MOUTH 2 (TWO) TIMES DAILY. (Patient not taking: Reported on 03/06/2016) 60 tablet 3  . ipratropium (ATROVENT) 0.03 % nasal spray Place 1 spray into both nostrils 2 (two) times daily. Reported on 01/25/2016    . mirtazapine (REMERON) 7.5 MG tablet TAKE 1 TABLET (7.5 MG TOTAL) BY MOUTH AT BEDTIME. (Patient not taking:  Reported on 03/06/2016) 30 tablet 0   No facility-administered medications prior to visit.      Allergies: Adhesive [tape]; Codeine; Morphine; Morphine and related; Tetanus toxoid; Tetanus toxoids; and Tramadol    PHYSICAL EXAM: VS:  BP (!) 160/80 (BP Location: Left Arm, Patient Position: Sitting, Cuff Size: Normal)   Pulse 66   Ht '5\' 2"'$  (1.575 m)   Wt 138 lb 4 oz (62.7 kg)  BMI 25.29 kg/m  , Body mass index is 25.29 kg/m. Wt Readings from Last 3 Encounters:  03/06/16 138 lb 4 oz (62.7 kg)  01/11/16 132 lb 4 oz (60 kg)  12/20/15 131 lb 2.8 oz (59.5 kg)    GENERAL:  well developed, well nourished, not in acute distress HEENT: normocephalic, pink conjunctivae, anicteric sclerae, no xanthelasma, normal dentition, oropharynx clear NECK:  no neck vein engorgement, JVP normal, no hepatojugular reflux, carotid upstroke brisk and symmetric, no bruit, no thyromegaly, no lymphadenopathy LUNGS:  good respiratory effort, clear to auscultation bilaterally CV:  PMI not displaced, no thrills, no lifts, S1 and S2 within normal limits, no palpable S3 or S4, no murmurs, no rubs, no gallops ABD:  Soft, nontender, nondistended, normoactive bowel sounds, no abdominal aortic bruit, no hepatomegaly, no splenomegaly MS: nontender back, no kyphosis, no scoliosis, no joint deformities EXT:  2+ DP/PT pulses, no edema, no varicosities, no cyanosis, no clubbing SKIN: warm, nondiaphoretic, normal turgor, no ulcers NEUROPSYCH: alert, oriented to person, place, and time, sensory/motor grossly intact, normal mood, appropriate affect  Recent Labs: 07/11/2015: ALT 23; TSH 1.63 10/29/2015: Magnesium 2.2 12/06/2015: BUN 12; Creatinine, Ser 0.73; Potassium 3.8; Sodium 143 01/23/2016: Hemoglobin 12.7; Platelets 86   Lipid Panel    Component Value Date/Time   CHOL 167 01/11/2015 1456   TRIG 72.0 01/11/2015 1456   HDL 54.50 01/11/2015 1456   CHOLHDL 3 01/11/2015 1456   VLDL 14.4 01/11/2015 1456   LDLCALC 98  01/11/2015 1456     Other studies Reviewed:  EKG:  The ekg from 03/06/2016 was personally reviewed by me and it revealed sinus rhythm, 66 BPM. LVH. QT 440 ms QTC 451 ms.  EKG from 10/28/2015 was personally reviewed by me and it revealed Narrow complex tachycardia, regular, ventricular rate of 159 BPM. Although the machine reading is of atrial fibrillation, there is a likelihood that this is sinus tachycardia. Possible LVH. Nonspecific ST-T wave changes.   Additional studies/ records that were reviewed personally reviewed by me today include:  Echo 10/30/2015: Procedure narrative: Transthoracic echocardiography. The study   was technically difficult. - Left ventricle: The cavity size was normal. There was mild   concentric hypertrophy. Systolic function was normal. The   estimated ejection fraction was in the range of 60% to 65%. Wall   motion was normal; there were no regional wall motion   abnormalities. The study is not technically sufficient to allow   evaluation of LV diastolic function. - Mitral valve: There was mild regurgitation. - Right ventricle: Systolic function was normal. - Pulmonary arteries: Systolic pressure was within the normal   range. - Pericardium, extracardiac: A trivial pericardial effusion was   identified.   CT chest 10/30/2015: IMPRESSION: 1. No pulmonary embolism. 2. Small right and trace left pleural effusions with mild to moderate bilateral lower lobe atelectasis. 3. Mild interlobular septal thickening in both lungs, suggesting mild pulmonary edema. 4. Coronary atherosclerosis.   ASSESSMENT AND PLAN: Chest pain Shortness of breath CAD based on atherosclerosis noted on CT scan Recommend functional evaluation with stress testing. Patient will benefit from pharmacologic nuclear stress test. Recommend aspirin enteric-coated 81 mg by mouth daily. Discussed LDL goal of less than 70 due to this diagnosis. Her LDL was 98 from 2016. May benefit eventually  from statin therapy. Patient will like to discuss this with her PCP.  HTN Currently being managed by her PCP. Losartan was recently increased in dose. If further dose adjustment is necessary, recommend use of metoprolol  over hydralazine.  ? Arrhythmia Diagnosed to have paroxysmal atrial fibrillation from recent admission in March 2017. Extensive review of EKGs and telemetry recordings did not give clear evidence of atrial fibrillation. There was note of now complex tachycardia likely paroxysmal atrial tachycardia. Recommend further evaluation with cardiac event monitor.  L carotid bruit This was previously noted Carotid artery duplex ultrasound was recommended. Will schedule this for patient.   Current medicines are reviewed at length with the patient today.  The patient does not have concerns regarding medicines.  Labs/ tests ordered today include:  Orders Placed This Encounter  Procedures  . NM Myocar Multi W/Spect W/Wall Motion / EF  . Cardiac event monitor  . EKG 12-Lead    I had a lengthy and detailed discussion with the patient regarding diagnoses, prognosis, diagnostic options, treatment options , and side effects of medications.   I counseled the patient on importance of lifestyle modification including heart healthy diet, regular physical activity .   Disposition:   FU with undersigned In 3 months  I spent at least 40 minutes with the patient today and more than 50% of the time was spent counseling the patient and coordinating care.    Signed, Caroline Bushy, MD  03/06/2016 10:19 AM    [place]  This note was generated in part with voice recognition software and I apologize for any typographical errors that were not detected and corrected.

## 2016-03-07 ENCOUNTER — Ambulatory Visit: Payer: PPO | Admitting: Physical Therapy

## 2016-03-07 DIAGNOSIS — M6281 Muscle weakness (generalized): Secondary | ICD-10-CM | POA: Diagnosis not present

## 2016-03-07 NOTE — Therapy (Signed)
[facility] [address] [place], [address] Phone: [phone]   Fax:  [phone]  Physical Therapy Treatment  Patient Details  Name: Caroline Nichols MRN: [id number] Date of Birth: 1940/09/11 Referring Provider: Deborra Medina  Encounter Date: 03/07/2016      PT End of Session - 03/07/16 1009    Visit Number 7   Number of Visits 17   Date for PT Re-Evaluation 18-Apr-2016   Authorization Type g codes   PT Start Time 1740   PT Stop Time 1005   PT Time Calculation (min) 23 min   Equipment Utilized During Treatment Gait belt   Activity Tolerance Patient tolerated treatment well   Behavior During Therapy [facility] for tasks assessed/performed      Past Medical History:  Diagnosis Date  . Allergy   . Anemia 02/2013   F/U with Dr. Grayland Ormond for Feraheme injections  . Anxiety   . Arthritis    Possible RA - Follow up appt with Dr. Jefm Bryant  . Cellulitis of arm, right   . Chronic kidney disease   . Diabetes insipidus ([place])    On desmopressin  . GERD (gastroesophageal reflux disease)   . H/O seasonal allergies   . H/O transfusion of packed red blood cells 02/2013   3 units PRBC for severe anemia 02/2013  . Herniated disc   . History of kidney stones   . Hyperlipidemia   . Hypertension   . Hyperthyroidism    s/p radiation therapy, now hypothyroidism  . IBS (irritable bowel syndrome)   . Migraine   . Pancreatic cyst   . Paroxysmal a-fib [facility])     Past Surgical History:  Procedure Laterality Date  . ABDOMINAL HYSTERECTOMY    . BREAST EXCISIONAL BIOPSY Right 2005   neg  . BREAST SURGERY  1990s   Biopsy  . CERVICAL SPINE SURGERY     Bone fusion  . HAMMER TOE SURGERY    . HERNIA REPAIR Left    Inguinal Hernia Repair  . ROBOTIC ASSITED PARTIAL NEPHRECTOMY Right 10/23/2015   Procedure: ROBOTIC ASSITED PARTIAL NEPHRECTOMY with intraop ultrasound;  Surgeon: Hollice Espy, MD;  Location: [facility] ORS;  Service:  Urology;  Laterality: Right;  . TUBAL LIGATION      There were no vitals filed for this visit.      Subjective Assessment - 03/07/16 0946    Subjective Pt reports she saw a cardiologist yesterday, she is going to need to wear a heart monitor for the next 30 days. Pt also reports she is feeling SOB and is had to hurry to get to session.    Pertinent History Pt reports that on March she had kidney surgery where they removed a cancerous mass from her R kidney. Pt reports she had a bad reaction to morphine and it "worked on my system." She has felt weak since that time. She spent approximately 2 weeks in the hospital and then spent 2 weeks in a skilled nursing facility ([facility]). She had shingles while at the SNF on her R abdomen under R breast and radiating around to her back. She still has pain associated with the shingles for which she has had mild improvement with gabapentin. She reports she has a history of rheumatoid arthritis and she sees Dr. Jefm Bryant at [facility]. Pt states that she has a history of anemia as well. They have been unable to identify a source for the anemia. She had blood work  on Tuesday (chart review shows H/H at 12.7/37.3) and has a follow-up appointment on 03/21/16 for additional bloodwork and to see Dr. Lurline Del. Pt reports she has had a bone marrow biopsy in the past which was negative. She complains that if she sits for an extended period of time she gets numbness in both legs. When she first stands up she say she feels like her "knees want to slip out of place." Pt also complains of getting quickly out of breath with activity. She was referred to physical therapy for strengthening due to deconditioning and weakness              Objective: O2 level extremely variable, dropping down to 85, raising up to 95.   Focused on education/reassessment of pt. amb in clinic x100'. TUG.   Educated pt on RPE and answered pt questions regarding progressing  HEP.  Pt verbalized understanding of this.                    PT Education - 03/07/16 0947    Education provided Yes   Education Details discharge instructions   Person(s) Educated Patient   Methods Explanation   Comprehension Verbalized understanding             PT Long Term Goals - 03/07/16 1014      PT LONG TERM GOAL #1   Title Pt will be independent with HEP to improve strength, cardiopulmonary endurance, and balance in order to improve function at home    Time 8   Period Weeks   Status Achieved     PT LONG TERM GOAL #2   Title Pt will decreased 5TSTS to below 12.6 seconds in order to demonstrate improved LE strength and decrease fall risk   Baseline 01/25/16: 25.88 seconds, 02/29/16: 16.02 (goal was 5 second decrease, achieved and revised);    Time 8   Period Weeks   Status Deferred     PT LONG TERM GOAL #3   Title Pt will decrease TUG to less than 14 seconds in order to demonstrate improved balance and strength to reduce risk for falls and improve function   Baseline 01/25/16: 16.8 seconds, 02/29/16: 14.08 seconds   Time 8   Period Weeks   Status Not Met     PT LONG TERM GOAL #4   Title Pt will improve single leg balance to >10 seconds and turning time to <4 seconds in order to demonstrate decreased fall risk;   Baseline 02/29/16: Single leg stance 5-6 seconds, Turning >4 seconds;   Time 4   Period Weeks   Status Not Met               Plan - 03/07/16 1009    Clinical Impression Statement At this time pt is appropriate for d/c. She is capable of continuing to strengthen on her own as tolerated based on oxygen level. O2 is being monitored by cardiologist. Limited ability to perform ther ex today due to variable O2.    Rehab Potential Poor   Clinical Impairments Affecting Rehab Potential Positive: motivation, Negative: recent surgery for cancerous mass on kidney, history of anemia, poor tolerance to 6MWT   PT Frequency 2x / week   PT Duration  8 weeks   PT Treatment/Interventions Aquatic Therapy;Electrical Stimulation;Moist Heat;Traction;Ultrasound;DME Instruction;Stair training;Functional mobility training;Gait training;Therapeutic activities;Therapeutic exercise;Balance training;Neuromuscular re-education;Patient/family education;Manual techniques;Energy conservation   PT Next Visit Plan Continue to progress strengthening and balance exercise intensity   PT Home Exercise Plan As prescribed  Consulted and Agree with Plan of Care Patient      Patient will benefit from skilled therapeutic intervention in order to improve the following deficits and impairments:  Abnormal gait, Decreased balance, Decreased endurance, Decreased activity tolerance, Decreased mobility, Decreased strength, Difficulty walking  Visit Diagnosis: Muscle weakness (generalized)       G-Codes - 2016/03/20 1016    Functional Assessment Tool Used clinical judgement, TUG, 5TSTS,    Functional Limitation Mobility: Walking and moving around   Mobility: Walking and Moving Around Discharge Status [phone]) At least 20 percent but less than 40 percent impaired, limited or restricted      Problem List Patient Active Problem List   Diagnosis Date Noted  . Generalized weakness 01/13/2016  . Atrial fibrillation with RVR ([place]) 10/28/2015  . Renal cell carcinoma of right kidney ([place]) 10/23/2015  . Preoperative evaluation of a medical condition to rule out surgical contraindications (TAR required) 10/12/2015  . Renal mass 10/03/2015  . Urinary frequency 07/28/2015  . Arthritis 05/10/2015  . Ache in joint 05/10/2015  . Nocturia 04/16/2015  . Neutropenia ([place]) 04/16/2015  . Hyperlipidemia 12/17/2014  . Depression 12/14/2014  . Xerostomia 11/14/2014  . Hyperglycemia 06/12/2014  . Diabetes insipidus ([place]) 06/12/2014  . S/P hysterectomy 06/09/2014  . Elevated CK 03/02/2014  . Decreased leukocytes 03/02/2014  . Medication management 11/11/2013  . Atherosclerotic  peripheral vascular disease with intermittent claudication ([place]) 10/07/2013  . Edema of both legs 10/07/2013  . Venous stasis dermatitis 10/07/2013  . Chest pain 03/26/2013  . Left carotid bruit 03/26/2013  . Acquired pancytopenia ([place]) 03/20/2013  . Polyarthralgia 03/20/2013  . Hypothyroidism 03/10/2013  . Bradycardia 12/09/2012  . Cervical vertebral fusion 12/09/2012  . Gastrointestinal hemorrhage 12/09/2012  . Effusion of knee 12/09/2012  . Neuropathy ([place]) 12/09/2012  . Diuresis excessive 12/09/2012  . Compulsive tobacco user syndrome 12/09/2012  . Decreased body weight 12/09/2012  . Blush 12/09/2012  . Current tobacco use 12/09/2012  . Abnormal weight loss 12/09/2012  . IBS (irritable bowel syndrome)   . Degenerative arthritis of lumbar spine   . MURMUR 09/27/2009  . Postablative hypothyroidism 05/15/2009  . Anxiety state 05/15/2009  . Essential hypertension 05/15/2009    ,  PT DPT 20-Mar-2016, 10:20 AM  [facility] [address], [place], [address] Phone: [phone]   Fax:  [phone]  Name: Caroline Nichols MRN: [id number] Date of Birth: 09/05/1940

## 2016-03-11 ENCOUNTER — Other Ambulatory Visit: Payer: Self-pay | Admitting: Internal Medicine

## 2016-03-11 ENCOUNTER — Ambulatory Visit (INDEPENDENT_AMBULATORY_CARE_PROVIDER_SITE_OTHER): Payer: PPO

## 2016-03-11 DIAGNOSIS — R079 Chest pain, unspecified: Secondary | ICD-10-CM | POA: Diagnosis not present

## 2016-03-11 DIAGNOSIS — R0602 Shortness of breath: Secondary | ICD-10-CM

## 2016-03-12 ENCOUNTER — Telehealth: Payer: Self-pay | Admitting: Cardiology

## 2016-03-12 NOTE — Telephone Encounter (Signed)
No answer/No voicemail. Was trying to call and verify stress test and instructions with patient.

## 2016-03-13 ENCOUNTER — Encounter
Admission: RE | Admit: 2016-03-13 | Discharge: 2016-03-13 | Disposition: A | Discharge status: Home / Self Care | Payer: PPO | Source: Ambulatory Visit | Attending: Cardiology | Admitting: Cardiology

## 2016-03-13 ENCOUNTER — Telehealth: Payer: Self-pay | Admitting: Cardiology

## 2016-03-13 DIAGNOSIS — R0602 Shortness of breath: Secondary | ICD-10-CM | POA: Diagnosis present

## 2016-03-13 DIAGNOSIS — R079 Chest pain, unspecified: Secondary | ICD-10-CM | POA: Insufficient documentation

## 2016-03-13 LAB — NM MYOCAR MULTI W/SPECT W/WALL MOTION / EF
CHL CUP NUCLEAR SDS: 1
CHL CUP NUCLEAR SRS: 4
CHL CUP NUCLEAR SSS: 3
CHL CUP STRESS STAGE 1 GRADE: 0 %
CHL CUP STRESS STAGE 1 HR: 69 {beats}/min
CHL CUP STRESS STAGE 1 SPEED: 0 mph
CHL CUP STRESS STAGE 2 HR: 69 {beats}/min
CHL CUP STRESS STAGE 4 GRADE: 0 %
CHL CUP STRESS STAGE 4 HR: 85 {beats}/min
CHL CUP STRESS STAGE 4 SPEED: 0 mph
CHL CUP STRESS STAGE 5 DBP: 74 mmHg
CSEPED: 0 min
CSEPEW: 1 METS
CSEPPHR: 82 {beats}/min
CSEPPMHR: 56 %
Exercise duration (sec): 0 s
LVDIAVOL: 72 mL (ref 46–106)
LVSYSVOL: 25 mL
MPHR: 146 {beats}/min
Percent HR: 58 %
Rest HR: 68 {beats}/min
Stage 2 Grade: 0 %
Stage 2 Speed: 0 mph
Stage 3 Grade: 0 %
Stage 3 HR: 82 {beats}/min
Stage 3 Speed: 0 mph
Stage 5 Grade: 0 %
Stage 5 HR: 78 {beats}/min
Stage 5 SBP: 163 mmHg
Stage 5 Speed: 0 mph
TID: 1.09

## 2016-03-13 MED ORDER — TECHNETIUM TC 99M TETROFOSMIN IV KIT
13.0000 | PACK | Freq: Once | INTRAVENOUS | Status: AC | PRN
  Administered 2016-03-13: 12.35 via INTRAVENOUS

## 2016-03-13 MED ORDER — TECHNETIUM TC 99M TETROFOSMIN IV KIT
32.8340 | PACK | Freq: Once | INTRAVENOUS | Status: AC | PRN
  Administered 2016-03-13: 32.834 via INTRAVENOUS

## 2016-03-13 MED ORDER — REGADENOSON 0.4 MG/5ML IV SOLN
0.4000 mg | Freq: Once | INTRAVENOUS | Status: AC
  Administered 2016-03-13: 0.4 mg via INTRAVENOUS

## 2016-03-13 NOTE — Telephone Encounter (Signed)
Received call from [place] at [place] that pt pressed monitor button to report she passed out. [employer] has not been able to get in touch with the patient. Pt in SR w/HR 71.  I called pt home number, no answer. S/w pt daughter, Champ Mungo, who reports pt is still at [facility] having stress test. States pt "is not good with technology and probably just pushed the button" as she has seen her mother "messing with it" since she began wearing.  S/w Abby in [place] who confirmed pt is still there and is doing fine.

## 2016-03-15 ENCOUNTER — Telehealth: Payer: Self-pay | Admitting: Cardiology

## 2016-03-15 NOTE — Telephone Encounter (Signed)
[employer] monitoring company sent an email stating that patient had a 4.1 second pause last night and per the event report it was at 12:16 AM . Notified Dr. Stanford Breed cardiologist on call and also Dr. Karlyn Agee and spoke with patients daughter [name] per release form (Patient does not answer her phone) and she reports that her mother was in bed asleep and did not have any symptoms. Informed her that she had a pause that was detected on the monitor and let her know that she should monitor her mother for any symptoms. Instructed her to go to nearest emergency room if she should feel faint, dizziness, or if she should have any syncope. She verbalized understanding and reports that her mother feels fine other than some shortness of breath. She was grateful for our concern and calling to check on her.

## 2016-03-19 NOTE — Progress Notes (Signed)
[facility] [phone]  [phone]     GREY SCHLAUCH OB: [birth year]  MR#: [id number]  QVZ#:[id number]  Patient Care Team: [name], MD as PCP - General (Internal Medicine) [name], MD (Internal Medicine)  CHIEF COMPLAINT:   Pancytopenia, Renal cell carcinoma of right kidney.  HISTORY OF PRESENT ILLNESS:  Patient returns to clinic today for routine 3 month evaluation. She continues to take Plaquenil for her rheumatoid arthritis and is tolerating it well. She currently feels well and is asymptomatic. She denies any recent fevers or illnesses. She has good appetite and denies weight loss. She has no neurologic complaints. She denies any chest pain or shortness of breath. She denies any nausea, vomiting, constipation, or diarrhea. She has no urinary complaints. Patient offers no specific complaints today.   REVIEW OF SYSTEMS:    All other systems reviewed and are negative.    PAST MEDICAL HISTORY: Past Medical History:  Diagnosis Date  . Allergy   . Anemia 02/2013   F/U with Dr. Grayland Ormond for Feraheme injections  . Anxiety   . Arthritis    Possible RA - Follow up appt with Dr. Jefm Bryant  . Cellulitis of arm, right   . Chronic kidney disease   . Diabetes insipidus ([place])    On desmopressin  . GERD (gastroesophageal reflux disease)   . H/O seasonal allergies   . H/O transfusion of packed red blood cells 02/2013   3 units PRBC for severe anemia 02/2013  . Herniated disc   . History of kidney stones   . Hyperlipidemia   . Hypertension   . Hyperthyroidism    s/p radiation therapy, now hypothyroidism  . IBS (irritable bowel syndrome)   . Migraine   . Pancreatic cyst   . Paroxysmal a-fib ([facility])     PAST SURGICAL HISTORY: Past Surgical History:  Procedure Laterality Date  . ABDOMINAL HYSTERECTOMY    . BREAST EXCISIONAL BIOPSY Right 2005   neg  . BREAST SURGERY  1990s   Biopsy  . CERVICAL SPINE SURGERY     Bone fusion  . HAMMER TOE  SURGERY    . HERNIA REPAIR Left    Inguinal Hernia Repair  . ROBOTIC ASSITED PARTIAL NEPHRECTOMY Right 10/23/2015   Procedure: ROBOTIC ASSITED PARTIAL NEPHRECTOMY with intraop ultrasound;  Surgeon: Hollice Espy, MD;  Location: [facility] ORS;  Service: Urology;  Laterality: Right;  . TUBAL LIGATION      FAMILY HISTORY Family History  Problem Relation Age of Onset  . Heart disease Mother   . Heart disease Father   . Diabetes Brother   . Kidney disease Brother   . Stroke Daughter     due to medication reaction  . Prostate cancer Neg Hx   . Hematuria Neg Hx     ADVANCED DIRECTIVES:  No flowsheet data found.  HEALTH MAINTENANCE: Social History  Substance Use Topics  . Smoking status: Former Smoker    Packs/day: 0.50    Years: 30.00    Types: Cigarettes    Quit date: 12/03/2012  . Smokeless tobacco: Never Used  . Alcohol use No     Allergies  Allergen Reactions  . Adhesive [Tape] Other (See Comments)    "irritate skin"  . Codeine     REACTION: NAUSEA  . Morphine Other (See Comments)  . Morphine And Related Other (See Comments)    Shut down her organs  . Tetanus Toxoid     Other reaction(s): Unknown REACTION: ARM SWELLING,REDNESS  .  Tetanus Toxoids   . Tramadol Nausea And Vomiting    Current Outpatient Prescriptions  Medication Sig Dispense Refill  . acetaminophen (TYLENOL) 325 MG tablet Take 2 tablets (650 mg total) by mouth every 6 (six) hours as needed for pain.    Marland Kitchen ALPRAZolam (XANAX) 0.25 MG tablet Take 1 tablet (0.25 mg total) by mouth 2 (two) times daily as needed for anxiety. 60 tablet 2  . brimonidine (ALPHAGAN) 0.2 % ophthalmic solution Place 1 drop into both eyes 2 (two) times daily.    . Calcium-Vitamin D (SUPER CALCIUM/D) 600-125 MG-UNIT TABS Take by mouth. Taking 2 daily    . Cholecalciferol (D3-1000 PO) Take 1,000 Units by mouth daily. Reported on 07/28/2015    . ferrous sulfate 325 (65 FE) MG tablet Take 325 mg by mouth daily with breakfast.    .  fluticasone (FLONASE) 50 MCG/ACT nasal spray Place 2 sprays into both nostrils daily. 16 g 6  . gabapentin (NEURONTIN) 100 MG capsule Take 1 capsule (100 mg total) by mouth 3 (three) times daily. 90 capsule 3  . gabapentin (NEURONTIN) 300 MG capsule Take 1 capsule (300 mg total) by mouth 3 (three) times daily. As needed for shingles pain 90 capsule 1  . hydroxychloroquine (PLAQUENIL) 200 MG tablet Take 200 mg by mouth daily.     Marland Kitchen latanoprost (XALATAN) 0.005 % ophthalmic solution Place 1 drop into both eyes at bedtime.     Marland Kitchen levothyroxine (SYNTHROID, LEVOTHROID) 112 MCG tablet TAKE 1 TABLET  BY MOUTH DAILY BEFORE BREAKFAST. 90 tablet 0  . losartan (COZAAR) 100 MG tablet Take 1 tablet (100 mg total) by mouth daily. NOTE DOSE CHANGE TO 100 MG DAILY 90 tablet 1  . mirabegron ER (MYRBETRIQ) 50 MG TB24 tablet Take 1 tablet (50 mg total) by mouth daily. 30 tablet 2  . mirtazapine (REMERON) 7.5 MG tablet TAKE 1 TABLET (7.5 MG TOTAL) BY MOUTH AT BEDTIME. 30 tablet 0  . nitroGLYCERIN (NITROLINGUAL) 0.4 MG/SPRAY spray Place 1 spray under the tongue every 5 (five) minutes x 3 doses as needed for chest pain. 4.9 g 11  . Omega-3 Fatty Acids (FISH OIL) 1000 MG CAPS Take 1,000 mg by mouth daily.     . pantoprazole (PROTONIX) 40 MG tablet TAKE 1 TABLET (40 MG TOTAL) BY MOUTH DAILY. 30 tablet 4  . vitamin C (ASCORBIC ACID) 500 MG tablet Take 500 mg by mouth daily.    . hydrALAZINE (APRESOLINE) 10 MG tablet TAKE 1 TABLET (10 MG TOTAL) BY MOUTH 2 (TWO) TIMES DAILY. 180 tablet 3   Current Facility-Administered Medications  Medication Dose Route Frequency Provider Last Rate Last Dose  . aspirin chewable tablet 81 mg  81 mg Oral Once Wende Bushy, MD        OBJECTIVE:  Vitals:   03/21/16 1014  BP: (!) 190/94  Pulse: 64  Temp: 98.2 F (36.8 C)     Body mass index is 25.2 kg/m.    ECOG FS:1 - Symptomatic but completely ambulatory  Physical Exam   General: Well-developed, well-nourished, no acute  distress. Eyes: Pink conjunctiva, anicteric sclera. HEENT: Normocephalic, moist mucous membranes, clear oropharnyx. Lungs: Clear to auscultation bilaterally. Heart: Regular rate and rhythm. No rubs, murmurs, or gallops. Abdomen: Soft, nontender, nondistended. No organomegaly noted, normoactive bowel sounds. Musculoskeletal: No edema, cyanosis, or clubbing. Neuro: Alert, answering all questions appropriately. Cranial nerves grossly intact. Skin: No rashes or petechiae noted. Psych: Normal affect. Lymphatics: No cervical, calvicular, axillary or inguinal LAD.  LAB RESULTS:  Appointment on 03/21/2016  Component Date Value Ref Range Status  . WBC 03/21/2016 2.2* 3.6 - 11.0 K/uL Final  . RBC 03/21/2016 4.08  3.80 - 5.20 MIL/uL Final  . Hemoglobin 03/21/2016 12.4  12.0 - 16.0 g/dL Final  . HCT 03/21/2016 36.0  35.0 - 47.0 % Final  . MCV 03/21/2016 88.3  80.0 - 100.0 fL Final  . MCH 03/21/2016 30.4  26.0 - 34.0 pg Final  . MCHC 03/21/2016 34.4  32.0 - 36.0 g/dL Final  . RDW 03/21/2016 15.2* 11.5 - 14.5 % Final  . Platelets 03/21/2016 82* 150 - 440 K/uL Final  . Neutrophils Relative % 03/21/2016 29%  % Final  . Neutro Abs 03/21/2016 0.6* 1.4 - 6.5 K/uL Final  . Lymphocytes Relative 03/21/2016 40%  % Final  . Lymphs Abs 03/21/2016 0.9* 1.0 - 3.6 K/uL Final  . Monocytes Relative 03/21/2016 30%  % Final  . Monocytes Absolute 03/21/2016 0.7  0.2 - 0.9 K/uL Final  . Eosinophils Relative 03/21/2016 0%  % Final  . Eosinophils Absolute 03/21/2016 0.0  0 - 0.7 K/uL Final  . Basophils Relative 03/21/2016 1%  % Final  . Basophils Absolute 03/21/2016 0.0  0 - 0.1 K/uL Final       STUDIES: Nm Myocar Multi W/spect W/wall Motion / Ef  Addendum Date: 03/13/2016    There was no ST segment deviation noted during stress.  No T wave inversion was noted during stress.  This is a low risk study.  Nuclear stress EF: 69%.  No evidence of ischemia on this study.    Result Date: 03/13/2016  There  was no ST segment deviation noted during stress.  No T wave inversion was noted during stress.  This is a low risk study.  Nuclear stress EF: 69%.     ASSESSMENT:  Pancytopenia, Renal cell carcinoma of right kidney   PLAN:  1. Pancytopenia: Previously, bone marrow biopsy revealed no evidence of MDS or other abnormality. Her white blood cell count and platelet count continue to be decreased, but relatively stable and at her baseline. It is possible it is related to her Plaquenil or rheumatoid arthritis. The remainder of her laboratory work was either negative or within normal limits. No intervention is needed at this time. Return to clinic in 3 months for laboratory work and then in 6 months for laboratory work and further evaluation. 2. Renal cell carcinoma of the right kidney: Status post partial nephrectomy. Continue follow-up with urology as indicated. 3. Rheumatoid arthritis: Continue Plaquenil. Continue follow-up per rheumatology.   Patient expressed understanding and was in agreement with this plan. She also understands that She can call clinic at any time with any questions, concerns, or complaints.    Lloyd Huger, MD   03/24/2016 10:47 PM

## 2016-03-21 ENCOUNTER — Inpatient Hospital Stay: Payer: PPO | Attending: Oncology

## 2016-03-21 ENCOUNTER — Other Ambulatory Visit: Payer: Self-pay | Admitting: Internal Medicine

## 2016-03-21 ENCOUNTER — Inpatient Hospital Stay (HOSPITAL_BASED_OUTPATIENT_CLINIC_OR_DEPARTMENT_OTHER): Payer: PPO | Admitting: Oncology

## 2016-03-21 ENCOUNTER — Ambulatory Visit: Payer: PPO

## 2016-03-21 VITALS — BP 190/94 | HR 64 | Temp 98.2°F | Wt 137.8 lb

## 2016-03-21 DIAGNOSIS — Z7982 Long term (current) use of aspirin: Secondary | ICD-10-CM | POA: Insufficient documentation

## 2016-03-21 DIAGNOSIS — I129 Hypertensive chronic kidney disease with stage 1 through stage 4 chronic kidney disease, or unspecified chronic kidney disease: Secondary | ICD-10-CM | POA: Diagnosis not present

## 2016-03-21 DIAGNOSIS — K219 Gastro-esophageal reflux disease without esophagitis: Secondary | ICD-10-CM | POA: Insufficient documentation

## 2016-03-21 DIAGNOSIS — K589 Irritable bowel syndrome without diarrhea: Secondary | ICD-10-CM | POA: Insufficient documentation

## 2016-03-21 DIAGNOSIS — M129 Arthropathy, unspecified: Secondary | ICD-10-CM

## 2016-03-21 DIAGNOSIS — Z923 Personal history of irradiation: Secondary | ICD-10-CM | POA: Insufficient documentation

## 2016-03-21 DIAGNOSIS — I4891 Unspecified atrial fibrillation: Secondary | ICD-10-CM

## 2016-03-21 DIAGNOSIS — N189 Chronic kidney disease, unspecified: Secondary | ICD-10-CM | POA: Diagnosis not present

## 2016-03-21 DIAGNOSIS — E0590 Thyrotoxicosis, unspecified without thyrotoxic crisis or storm: Secondary | ICD-10-CM | POA: Insufficient documentation

## 2016-03-21 DIAGNOSIS — F419 Anxiety disorder, unspecified: Secondary | ICD-10-CM | POA: Diagnosis not present

## 2016-03-21 DIAGNOSIS — M069 Rheumatoid arthritis, unspecified: Secondary | ICD-10-CM | POA: Insufficient documentation

## 2016-03-21 DIAGNOSIS — Z8669 Personal history of other diseases of the nervous system and sense organs: Secondary | ICD-10-CM

## 2016-03-21 DIAGNOSIS — C641 Malignant neoplasm of right kidney, except renal pelvis: Secondary | ICD-10-CM | POA: Diagnosis not present

## 2016-03-21 DIAGNOSIS — E232 Diabetes insipidus: Secondary | ICD-10-CM | POA: Diagnosis not present

## 2016-03-21 DIAGNOSIS — Z79899 Other long term (current) drug therapy: Secondary | ICD-10-CM | POA: Diagnosis not present

## 2016-03-21 DIAGNOSIS — R0989 Other specified symptoms and signs involving the circulatory and respiratory systems: Secondary | ICD-10-CM

## 2016-03-21 DIAGNOSIS — Z87442 Personal history of urinary calculi: Secondary | ICD-10-CM

## 2016-03-21 DIAGNOSIS — Z87891 Personal history of nicotine dependence: Secondary | ICD-10-CM | POA: Diagnosis not present

## 2016-03-21 DIAGNOSIS — E785 Hyperlipidemia, unspecified: Secondary | ICD-10-CM

## 2016-03-21 DIAGNOSIS — D61818 Other pancytopenia: Secondary | ICD-10-CM | POA: Insufficient documentation

## 2016-03-21 DIAGNOSIS — R079 Chest pain, unspecified: Secondary | ICD-10-CM

## 2016-03-21 DIAGNOSIS — D72819 Decreased white blood cell count, unspecified: Secondary | ICD-10-CM

## 2016-03-21 DIAGNOSIS — R0602 Shortness of breath: Secondary | ICD-10-CM

## 2016-03-21 LAB — CBC WITH DIFFERENTIAL/PLATELET
BASOS ABS: 0 10*3/uL (ref 0–0.1)
EOS ABS: 0 10*3/uL (ref 0–0.7)
Eosinophils Relative: 0 %
HEMATOCRIT: 36 % (ref 35.0–47.0)
Hemoglobin: 12.4 g/dL (ref 12.0–16.0)
Lymphocytes Relative: 40 %
Lymphs Abs: 0.9 10*3/uL — ABNORMAL LOW (ref 1.0–3.6)
MCH: 30.4 pg (ref 26.0–34.0)
MCHC: 34.4 g/dL (ref 32.0–36.0)
MCV: 88.3 fL (ref 80.0–100.0)
MONO ABS: 0.7 10*3/uL (ref 0.2–0.9)
NEUTROS ABS: 0.6 10*3/uL — AB (ref 1.4–6.5)
Platelets: 82 10*3/uL — ABNORMAL LOW (ref 150–440)
RBC: 4.08 MIL/uL (ref 3.80–5.20)
RDW: 15.2 % — AB (ref 11.5–14.5)
WBC: 2.2 10*3/uL — ABNORMAL LOW (ref 3.6–11.0)

## 2016-04-01 ENCOUNTER — Telehealth: Payer: Self-pay | Admitting: Urology

## 2016-04-01 NOTE — Telephone Encounter (Signed)
Patient is calling the office this afternoon requesting more samples of '50mg'$  Mybetriq.

## 2016-04-01 NOTE — Telephone Encounter (Signed)
LMOM- samples were left up front.

## 2016-04-02 ENCOUNTER — Telehealth: Payer: Self-pay | Admitting: *Deleted

## 2016-04-02 NOTE — Telephone Encounter (Signed)
Patient has requested to have Dr. Derrel Nip write a note stating that she'snot able to attend jury duty . She stated her history  illnesses are the reasons she could not sit through jury duty.

## 2016-04-02 NOTE — Telephone Encounter (Signed)
I do not write letters of excuse for jury duty

## 2016-04-02 NOTE — Telephone Encounter (Signed)
Please advise if this is appropriate? thanks

## 2016-04-03 ENCOUNTER — Telehealth: Payer: Self-pay | Admitting: Urology

## 2016-04-03 NOTE — Telephone Encounter (Signed)
Spoke with patient and gave Dr. Cherrie Gauze message. Patient states Dr. Derrel Nip doesn't give those letters and to call some of her other Dr.'s. I reinforced Dr. Erlene Quan would not give a letter to excuse her from jury duty either because she has no urological sxs to support the letter. Patient states she understands.

## 2016-04-03 NOTE — Telephone Encounter (Signed)
She needs to contact her PCP for this.   She has no urological issues that would prevent her from performing her civic duty.  Hollice Espy, MD

## 2016-04-03 NOTE — Telephone Encounter (Signed)
Left message for patient to return call to office. 

## 2016-04-03 NOTE — Telephone Encounter (Signed)
Patient wants a letter stating that she can not sit thru jury duty. She got a letter saying that she had to report for it and she doesn't drive and that because she has so many health issues she would like to be excused from going. She wants a call back letting her know.  Thanks, Sharyn Lull

## 2016-04-05 ENCOUNTER — Telehealth: Payer: Self-pay | Admitting: *Deleted

## 2016-04-05 NOTE — Telephone Encounter (Signed)
I was unable to see any besides you called and left a message. Please advise.

## 2016-04-05 NOTE — Telephone Encounter (Signed)
Patient states Caroline Nichols left her a message and she is returning her call. She states she can call her back. Her number is [phone]. Please advise.

## 2016-04-05 NOTE — Telephone Encounter (Signed)
Second message left for patient to return call to office.

## 2016-04-05 NOTE — Telephone Encounter (Signed)
Patient has been informed.

## 2016-04-21 ENCOUNTER — Other Ambulatory Visit: Payer: Self-pay | Admitting: Internal Medicine

## 2016-04-22 ENCOUNTER — Other Ambulatory Visit: Payer: Self-pay | Admitting: Surgical

## 2016-04-22 NOTE — Progress Notes (Unsigned)
Please advise on refill. Patient last seen 01/31/16.

## 2016-04-22 NOTE — Progress Notes (Signed)
Cardiology Office Note   Date:  04/23/2016   ID:  Caroline Nichols, DOB 01-22-1941, MRN [id number]  Referring Doctor:  [name], MD   Cardiologist:   Wende Bushy, MD   Reason for consultation:  Chief Complaint  Patient presents with  . other    C/o fatigue and ankle swelling. Meds reviewed verbally with pt.      History of Present Illness: Caroline Nichols is a 75 y.o. female who presents for Follow-up  Patient was last seen by Dr. Rockey Situ in Aug 2014.   No recurrence of chest pain. She also has shortness of breath with minimal exertion, walking less than half of a block. Again this has been going on for quite some time now, mildly progressive in nature. No change in shortness of breath since last visit.  She has complaints of fatigue but she feels is related to poor sleeping habits. She sleeps early as she wakes up early. Probably asleep from 7pm to 2 in the morning. If she wakes up prior to that, she has difficulty falling back asleep. Since she wakes up very early in the morning, she goes to bed really early too  She denies fever, cough, colds, abdominal pain she had issues with bleeding from the nose while she was using Flonase but no other bleeding issues. No PND, orthopnea, edema palpitations. She does not complain of claudication. Other issues pertain to joint pains from arthritis.   ROS:  Please see the history of present illness. Aside from mentioned under HPI, all other systems are reviewed and negative.     Past Medical History:  Diagnosis Date  . Allergy   . Anemia 02/2013   F/U with Dr. Grayland Ormond for Feraheme injections  . Anxiety   . Arthritis    Possible RA - Follow up appt with Dr. Jefm Bryant  . Cellulitis of arm, right   . Chronic kidney disease   . Diabetes insipidus ([place])    On desmopressin  . GERD (gastroesophageal reflux disease)   . H/O seasonal allergies   . H/O transfusion of packed red blood cells 02/2013   3 units PRBC for severe anemia  02/2013  . Herniated disc   . History of kidney stones   . Hyperlipidemia   . Hypertension   . Hyperthyroidism    s/p radiation therapy, now hypothyroidism  . IBS (irritable bowel syndrome)   . Migraine   . Pancreatic cyst   . Paroxysmal a-fib [facility])     Past Surgical History:  Procedure Laterality Date  . ABDOMINAL HYSTERECTOMY    . BREAST EXCISIONAL BIOPSY Right 2005   neg  . BREAST SURGERY  1990s   Biopsy  . CERVICAL SPINE SURGERY     Bone fusion  . HAMMER TOE SURGERY    . HERNIA REPAIR Left    Inguinal Hernia Repair  . ROBOTIC ASSITED PARTIAL NEPHRECTOMY Right 10/23/2015   Procedure: ROBOTIC ASSITED PARTIAL NEPHRECTOMY with intraop ultrasound;  Surgeon: Hollice Espy, MD;  Location: [facility] ORS;  Service: Urology;  Laterality: Right;  . TUBAL LIGATION       reports that she quit smoking about 3 years ago. Her smoking use included Cigarettes. She has a 15.00 pack-year smoking history. She has never used smokeless tobacco. She reports that she does not drink alcohol or use drugs.   family history includes Diabetes in her brother; Heart disease in her father and mother; Kidney disease in her brother; Stroke in her daughter.  Outpatient Medications Prior to Visit  Medication Sig Dispense Refill  . acetaminophen (TYLENOL) 325 MG tablet Take 2 tablets (650 mg total) by mouth every 6 (six) hours as needed for pain.    Marland Kitchen ALPRAZolam (XANAX) 0.25 MG tablet Take 1 tablet (0.25 mg total) by mouth 2 (two) times daily as needed for anxiety. 60 tablet 2  . brimonidine (ALPHAGAN) 0.2 % ophthalmic solution Place 1 drop into both eyes 2 (two) times daily.    . Calcium-Vitamin D (SUPER CALCIUM/D) 600-125 MG-UNIT TABS Take by mouth. Taking 2 daily    . Cholecalciferol (D3-1000 PO) Take 1,000 Units by mouth daily. Reported on 07/28/2015    . ferrous sulfate 325 (65 FE) MG tablet Take 325 mg by mouth daily with breakfast.    . fluticasone (FLONASE) 50 MCG/ACT nasal spray Place 2 sprays into both  nostrils daily. 16 g 6  . gabapentin (NEURONTIN) 100 MG capsule Take 1 capsule (100 mg total) by mouth 3 (three) times daily. 90 capsule 3  . gabapentin (NEURONTIN) 300 MG capsule Take 1 capsule (300 mg total) by mouth 3 (three) times daily. As needed for shingles pain 90 capsule 1  . hydrALAZINE (APRESOLINE) 10 MG tablet TAKE 1 TABLET (10 MG TOTAL) BY MOUTH 2 (TWO) TIMES DAILY. 180 tablet 3  . hydroxychloroquine (PLAQUENIL) 200 MG tablet Take 200 mg by mouth daily.     Marland Kitchen latanoprost (XALATAN) 0.005 % ophthalmic solution Place 1 drop into both eyes at bedtime.     Marland Kitchen levothyroxine (SYNTHROID, LEVOTHROID) 112 MCG tablet TAKE 1 TABLET  BY MOUTH DAILY BEFORE BREAKFAST. 90 tablet 0  . losartan (COZAAR) 100 MG tablet Take 1 tablet (100 mg total) by mouth daily. NOTE DOSE CHANGE TO 100 MG DAILY 90 tablet 1  . mirabegron ER (MYRBETRIQ) 50 MG TB24 tablet Take 1 tablet (50 mg total) by mouth daily. 30 tablet 2  . mirtazapine (REMERON) 7.5 MG tablet TAKE 1 TABLET (7.5 MG TOTAL) BY MOUTH AT BEDTIME. 30 tablet 3  . nitroGLYCERIN (NITROLINGUAL) 0.4 MG/SPRAY spray Place 1 spray under the tongue every 5 (five) minutes x 3 doses as needed for chest pain. 4.9 g 11  . Omega-3 Fatty Acids (FISH OIL) 1000 MG CAPS Take 1,000 mg by mouth daily.     . pantoprazole (PROTONIX) 40 MG tablet TAKE 1 TABLET (40 MG TOTAL) BY MOUTH DAILY. 30 tablet 4  . vitamin C (ASCORBIC ACID) 500 MG tablet Take 500 mg by mouth daily.     Facility-Administered Medications Prior to Visit  Medication Dose Route Frequency Provider Last Rate Last Dose  . aspirin chewable tablet 81 mg  81 mg Oral Once Wende Bushy, MD         Allergies: Adhesive [tape]; Codeine; Morphine; Morphine and related; Tetanus toxoid; Tetanus toxoids; and Tramadol    PHYSICAL EXAM: VS:  BP 114/64 (BP Location: Left Arm, Patient Position: Sitting, Cuff Size: Normal)   Pulse (!) 54   Ht '5\' 2"'$  (1.575 m)   Wt 141 lb 4 oz (64.1 kg)   BMI 25.83 kg/m  , Body mass index  is 25.83 kg/m. Wt Readings from Last 3 Encounters:  04/23/16 141 lb 4 oz (64.1 kg)  03/21/16 137 lb 12.6 oz (62.5 kg)  03/06/16 138 lb 4 oz (62.7 kg)    GENERAL:  well developed, well nourished, not in acute distress HEENT: normocephalic, pink conjunctivae, anicteric sclerae, no xanthelasma, normal dentition, oropharynx clear NECK:  no neck vein engorgement, JVP normal, no  hepatojugular reflux, carotid upstroke brisk and symmetric, no bruit, no thyromegaly, no lymphadenopathy LUNGS:  good respiratory effort, clear to auscultation bilaterally CV:  PMI not displaced, no thrills, no lifts, S1 and S2 within normal limits, no palpable S3 or S4, no murmurs, no rubs, no gallops ABD:  Soft, nontender, nondistended, normoactive bowel sounds, no abdominal aortic bruit, no hepatomegaly, no splenomegaly MS: nontender back, no kyphosis, no scoliosis, no joint deformities EXT:  2+ DP/PT pulses, no edema, no varicosities, no cyanosis, no clubbing SKIN: warm, nondiaphoretic, normal turgor, no ulcers NEUROPSYCH: alert, oriented to person, place, and time, sensory/motor grossly intact, normal mood, appropriate affect  Recent Labs: 07/11/2015: ALT 23; TSH 1.63 10/29/2015: Magnesium 2.2 12/06/2015: BUN 12; Creatinine, Ser 0.73; Potassium 3.8; Sodium 143 03/21/2016: Hemoglobin 12.4; Platelets 82   Lipid Panel    Component Value Date/Time   CHOL 167 01/11/2015 1456   TRIG 72.0 01/11/2015 1456   HDL 54.50 01/11/2015 1456   CHOLHDL 3 01/11/2015 1456   VLDL 14.4 01/11/2015 1456   LDLCALC 98 01/11/2015 1456     Other studies Reviewed:  EKG:  The ekg from 03/06/2016 was personally reviewed by me and it revealed sinus rhythm, 66 BPM. LVH. QT 440 ms QTC 451 ms.  EKG from 10/28/2015 was personally reviewed by me and it revealed Narrow complex tachycardia, regular, ventricular rate of 159 BPM. Although the machine reading is of atrial fibrillation, there is a likelihood that this is sinus tachycardia. Possible  LVH. Nonspecific ST-T wave changes.  EKG from 04/23/2016 was personally reviewed by me and showed the neck complex rhythm at 54 bpm, likely junctional rhythm.    Additional studies/ records that were reviewed personally reviewed by me today include:  Echo 10/30/2015: Procedure narrative: Transthoracic echocardiography. The study   was technically difficult. - Left ventricle: The cavity size was normal. There was mild   concentric hypertrophy. Systolic function was normal. The   estimated ejection fraction was in the range of 60% to 65%. Wall   motion was normal; there were no regional wall motion   abnormalities. The study is not technically sufficient to allow   evaluation of LV diastolic function. - Mitral valve: There was mild regurgitation. - Right ventricle: Systolic function was normal. - Pulmonary arteries: Systolic pressure was within the normal   range. - Pericardium, extracardiac: A trivial pericardial effusion was   identified.   CT chest 10/30/2015: IMPRESSION: 1. No pulmonary embolism. 2. Small right and trace left pleural effusions with mild to moderate bilateral lower lobe atelectasis. 3. Mild interlobular septal thickening in both lungs, suggesting mild pulmonary edema. 4. Coronary atherosclerosis.   Nuclear stress is 03/13/2016:  There was no ST segment deviation noted during stress.  No T wave inversion was noted during stress.  This is a low risk study.  Nuclear stress EF: 69%.  No evidence of ischemia on this study.  Event monitor 04/15/2016: Monitoring period was 03/11/2016 to 04/09/2016. Upon closer inspection of baseline sample, it appears to be possibly junctional rhythm. 60 BPM. It is labeled as atrial fibrillation but RR intervals are regular, no appreciable P waves.  Heart rate ranged from 31 BPM at 03/18/2016 8:59 PM. Maximum heart rate 102 bpm at 03/26/2016 5:05 AM.  The manually detected events were as follows: 1 is labeled as passing  out. Based on phone conversation documentation,  patient did not truly pass out and she may have pressed the button by mistake. The rhythm was sinus with artifact.  fatigue :  Sinus rhythm with PVC  Automatically detected events: Concerning for several episodes of sinus pauses. The pauses ranged from 2.2-4.2 seconds. These occurred at varying times:  03/15/2016, 12:09 AM  03/18/2016, 8:49 PM 03/19/2016 12:37 AM 03/22/2016 8:31 PM 03/28/2016 2:25AM 03/28/2016 10:36 PM 03/29/2016 12:28 AM 03/29/2016 1:23 AM 03/30/2016 12:49 AM  Episode of what appears to be junctional rhythm, sinus bradycardia with atrial runs. This was 04/08/2016 at 4:05 AM.   ASSESSMENT AND PLAN: Chest pain Shortness of breath CAD based on atherosclerosis noted on CT scan No evidence of ischemia from stress testing. Recommend aspirin enteric-coated 81 mg by mouth daily. Discussed LDL goal of less than 70 due to this diagnosis. Her LDL was 98 from 2016. May benefit eventually from statin therapy. Patient will like to discuss this with her PCP.  HTN Currently being managed by her PCP.   ? Arrhythmia Diagnosed to have paroxysmal atrial fibrillation from recent admission in March 2017. Extensive review of EKGs and telemetry recordings did not give clear evidence of atrial fibrillation. There was note of now complex tachycardia likely paroxysmal atrial tachycardia. cardiac event monitor results details as mentioned above. Concern for junctional rhythm, sinus pauses of up to 4.2 seconds. Results discussed with patient. EP consultation recommended.  L carotid bruit This was previously noted Carotid artery duplex ultrasound results from 03/21/2016 were within normal limits.  Current medicines are reviewed at length with the patient today.  The patient does not have concerns regarding medicines.  Labs/ tests ordered today include:  Orders Placed This Encounter  Procedures  . EKG 12-Lead    I had a lengthy and  detailed discussion with the patient regarding diagnoses, prognosis, diagnostic options, treatment options , and side effects of medications.   I counseled the patient on importance of lifestyle modification including heart healthy diet, regular physical activity .   Disposition:   FU with undersigned In 3 months  I spent at least 40 minutes with the patient today and more than 50% of the time was spent counseling the patient and coordinating care.    Signed, Wende Bushy, MD  04/23/2016 1:05 PM    [place]  This note was generated in part with voice recognition software and I apologize for any typographical errors that were not detected and corrected.

## 2016-04-23 ENCOUNTER — Encounter: Payer: Self-pay | Admitting: Cardiology

## 2016-04-23 ENCOUNTER — Ambulatory Visit (INDEPENDENT_AMBULATORY_CARE_PROVIDER_SITE_OTHER): Payer: PPO | Admitting: Cardiology

## 2016-04-23 VITALS — BP 114/64 | HR 54 | Ht 62.0 in | Wt 141.2 lb

## 2016-04-23 DIAGNOSIS — I499 Cardiac arrhythmia, unspecified: Secondary | ICD-10-CM

## 2016-04-23 DIAGNOSIS — I455 Other specified heart block: Secondary | ICD-10-CM

## 2016-04-23 DIAGNOSIS — I10 Essential (primary) hypertension: Secondary | ICD-10-CM | POA: Diagnosis not present

## 2016-04-23 DIAGNOSIS — R001 Bradycardia, unspecified: Secondary | ICD-10-CM | POA: Diagnosis not present

## 2016-04-23 MED ORDER — ALPRAZOLAM 0.25 MG PO TABS: 0.2500 mg | ORAL_TABLET | Freq: Two times a day (BID) | ORAL | 2 refills | Status: DC | PRN

## 2016-04-23 MED ORDER — NITROGLYCERIN 0.4 MG/SPRAY TL SOLN: 1.0000 | 0 refills | Status: DC | PRN

## 2016-04-23 NOTE — Addendum Note (Signed)
Addended by: Valora Corporal on: 04/23/2016 02:48 PM   Modules accepted: Orders

## 2016-04-23 NOTE — Patient Instructions (Signed)
Follow-Up: Your physician recommends that you schedule a follow-up appointment in: 3 months with Dr. Yvone Neu.  You have been referred to Dr. Caryl Comes for further evaluation.  It was a pleasure seeing you today here in the office. Please do not hesitate to give [place] a call back if you have any further questions. [place], BSN

## 2016-04-29 ENCOUNTER — Other Ambulatory Visit: Payer: Self-pay | Admitting: Internal Medicine

## 2016-04-29 DIAGNOSIS — Z1231 Encounter for screening mammogram for malignant neoplasm of breast: Secondary | ICD-10-CM

## 2016-04-30 ENCOUNTER — Ambulatory Visit (INDEPENDENT_AMBULATORY_CARE_PROVIDER_SITE_OTHER): Payer: PPO

## 2016-04-30 DIAGNOSIS — Z23 Encounter for immunization: Secondary | ICD-10-CM

## 2016-04-30 NOTE — Progress Notes (Signed)
Received flu shot

## 2016-05-14 ENCOUNTER — Other Ambulatory Visit: Payer: Self-pay | Admitting: Internal Medicine

## 2016-05-14 ENCOUNTER — Ambulatory Visit
Admission: RE | Admit: 2016-05-14 | Discharge: 2016-05-14 | Disposition: A | Discharge status: Home / Self Care | Payer: PPO | Source: Ambulatory Visit | Attending: Internal Medicine | Admitting: Internal Medicine

## 2016-05-14 DIAGNOSIS — Z1231 Encounter for screening mammogram for malignant neoplasm of breast: Secondary | ICD-10-CM

## 2016-05-23 ENCOUNTER — Other Ambulatory Visit: Payer: Self-pay | Admitting: Internal Medicine

## 2016-05-23 ENCOUNTER — Encounter: Payer: Self-pay | Admitting: Internal Medicine

## 2016-05-23 ENCOUNTER — Ambulatory Visit (INDEPENDENT_AMBULATORY_CARE_PROVIDER_SITE_OTHER): Payer: PPO | Admitting: Internal Medicine

## 2016-05-23 VITALS — BP 140/68 | HR 45 | Ht 62.0 in | Wt 141.8 lb

## 2016-05-23 DIAGNOSIS — I499 Cardiac arrhythmia, unspecified: Secondary | ICD-10-CM

## 2016-05-23 DIAGNOSIS — I4891 Unspecified atrial fibrillation: Secondary | ICD-10-CM | POA: Diagnosis not present

## 2016-05-23 DIAGNOSIS — I495 Sick sinus syndrome: Secondary | ICD-10-CM

## 2016-05-23 NOTE — Patient Instructions (Signed)
Medication Instructions: - Your physician recommends that you continue on your current medications as directed. Please refer to the Current Medication list given to you today.  Labwork: - pending procedure date  Procedures/Testing: - Your physician has recommended that you have a pacemaker inserted. A pacemaker is a small device that is placed under the skin of your chest or abdomen to help control abnormal heart rhythms. This device uses electrical pulses to prompt the heart to beat at a normal rate. Pacemakers are used to treat heart rhythms that are too slow. Wire (leads) are attached to the pacemaker that goes into the chambers of you heart. This is done in the hospital and usually requires and overnight stay.  Available dates for Dr. Caryl Comes are:  1) Friday 11/24 (could try for an 11:30 am case this day- you would need to arrive around 9:30) 2) Monday 11/27 (could try for a 9:30 am case or maybe an 11:30 am case)  ** please call Dr. Olin Pia nurse, Nira Conn, when you decide which date will work the best for you- [phone]**   Follow-Up: - pending procedure date  Any Additional Special Instructions Will Be Listed Below (If Applicable).     If you need a refill on your cardiac medications before your next appointment, please call your pharmacy.

## 2016-05-23 NOTE — Progress Notes (Signed)
ELECTROPHYSIOLOGY CONSULT NOTE  Patient ID: Caroline Nichols, MRN: [id number], DOB/AGE: 75-12-1940 75 y.o. Admit date: (Not on file) Date of Consult: 05/23/2016  Primary Physician: Caroline Mc, MD Primary Cardiologist: AI Consulting Physician AI  Chief Complaint: sinus node dysfunction   HPI SHIORI Caroline Nichols is a 75 y.o. female  Referred for evaluation of an abnormal event recorder.  She has a history of progressive exercise intolerance accompanied by dyspnea. She has not had peripheral edema or chest pain. She dates her symptoms back to March when she came in for renal procedure. Evaluation of the telemetry at that time demonstrated sinus rhythm with variable rates. There is also nonsustained atrial tachycardia/fibrillation episodes lasting less than a minute or so  More recently she's continued to struggle with fatigue. In this regard is notable that she goes to sleep at about 7:30 at night.  Event recorder was personally reviewed and demonstrates significant nocturnal pausing. She also has daytime sinus bradycardia as well as sinus arrest and junctional rhythm with rates into the 30s and 40s   DATE TEST    3/17    echo   EF 65 %  mild LAE  (2.3)   8/17    myoview   EF 69 % No ischemia       Past Medical History:  Diagnosis Date  . Allergy   . Anemia 02/2013   F/U with Dr. Grayland Ormond for Feraheme injections  . Anxiety   . Arthritis    Possible RA - Follow up appt with Dr. Jefm Bryant  . Cancer ([place])    Kidney  . Cellulitis of arm, right   . Chronic kidney disease   . Diabetes insipidus ([place])    On desmopressin  . GERD (gastroesophageal reflux disease)   . H/O seasonal allergies   . H/O transfusion of packed red blood cells 02/2013   3 units PRBC for severe anemia 02/2013  . Herniated disc   . History of kidney stones   . Hyperlipidemia   . Hypertension   . Hyperthyroidism    s/p radiation therapy, now hypothyroidism  . IBS (irritable bowel syndrome)   .  Migraine   . Pancreatic cyst   . Paroxysmal a-fib [place])       Surgical History:  Past Surgical History:  Procedure Laterality Date  . ABDOMINAL HYSTERECTOMY    . BREAST EXCISIONAL BIOPSY Right 2005   neg  . BREAST SURGERY  1990s   Biopsy  . CERVICAL SPINE SURGERY     Bone fusion  . HAMMER TOE SURGERY    . HERNIA REPAIR Left    Inguinal Hernia Repair  . ROBOTIC ASSITED PARTIAL NEPHRECTOMY Right 10/23/2015   Procedure: ROBOTIC ASSITED PARTIAL NEPHRECTOMY with intraop ultrasound;  Surgeon: Hollice Espy, MD;  Location: [facility] ORS;  Service: Urology;  Laterality: Right;  . TUBAL LIGATION       Home Meds: Prior to Admission medications   Medication Sig Start Date End Date Taking? Authorizing Provider  acetaminophen (TYLENOL) 325 MG tablet Take 2 tablets (650 mg total) by mouth every 6 (six) hours as needed for pain. 01/04/13   Modena Jansky, MD  ALPRAZolam Duanne Moron) 0.25 MG tablet Take 1 tablet (0.25 mg total) by mouth 2 (two) times daily as needed for anxiety. 04/23/16   Caroline Mc, MD  brimonidine (ALPHAGAN) 0.2 % ophthalmic solution Place 1 drop into both eyes 2 (two) times daily. 10/31/14   Historical Provider, MD  Calcium-Vitamin D (SUPER  CALCIUM/D) 600-125 MG-UNIT TABS Take by mouth. Taking 2 daily    Historical Provider, MD  Cholecalciferol (D3-1000 PO) Take 1,000 Units by mouth daily. Reported on 07/28/2015    Historical Provider, MD  ferrous sulfate 325 (65 FE) MG tablet Take 325 mg by mouth daily with breakfast.    Historical Provider, MD  fluticasone (FLONASE) 50 MCG/ACT nasal spray Place 2 sprays into both nostrils daily. 11/14/14   Caroline Mc, MD  gabapentin (NEURONTIN) 100 MG capsule Take 1 capsule (100 mg total) by mouth 3 (three) times daily. 01/11/16   Caroline Mc, MD  gabapentin (NEURONTIN) 300 MG capsule Take 1 capsule (300 mg total) by mouth 3 (three) times daily. As needed for shingles pain 01/11/16   Caroline Mc, MD  hydrALAZINE (APRESOLINE) 10 MG tablet TAKE 1  TABLET (10 MG TOTAL) BY MOUTH 2 (TWO) TIMES DAILY. 03/22/16   Caroline Mc, MD  hydroxychloroquine (PLAQUENIL) 200 MG tablet Take 200 mg by mouth daily.  12/11/15   Historical Provider, MD  latanoprost (XALATAN) 0.005 % ophthalmic solution Place 1 drop into both eyes at bedtime.  01/02/16   Historical Provider, MD  levothyroxine (SYNTHROID, LEVOTHROID) 112 MCG tablet TAKE 1 TABLET  BY MOUTH DAILY BEFORE BREAKFAST. 03/11/16   Caroline Mc, MD  losartan (COZAAR) 100 MG tablet Take 1 tablet (100 mg total) by mouth daily. NOTE DOSE CHANGE TO 100 MG DAILY 02/28/16   Caroline Mc, MD  mirabegron ER (MYRBETRIQ) 50 MG TB24 tablet Take 1 tablet (50 mg total) by mouth daily. 06/20/15   Ardis Hughs, MD  mirtazapine (REMERON) 7.5 MG tablet TAKE 1 TABLET (7.5 MG TOTAL) BY MOUTH AT BEDTIME. 04/22/16   Caroline Mc, MD  nitroGLYCERIN (NITROLINGUAL) 0.4 MG/SPRAY spray Place 1 spray under the tongue every 5 (five) minutes x 3 doses as needed for chest pain. 04/23/16   Wende Bushy, MD  Omega-3 Fatty Acids (FISH OIL) 1000 MG CAPS Take 1,000 mg by mouth daily.     Historical Provider, MD  pantoprazole (PROTONIX) 40 MG tablet TAKE 1 TABLET (40 MG TOTAL) BY MOUTH DAILY. 02/20/15   Rubbie Battiest, NP  vitamin C (ASCORBIC ACID) 500 MG tablet Take 500 mg by mouth daily.    Historical Provider, MD    Allergies:  Allergies  Allergen Reactions  . Adhesive [Tape] Other (See Comments)    "irritate skin"  . Codeine     REACTION: NAUSEA  . Morphine Other (See Comments)  . Morphine And Related Other (See Comments)    Shut down her organs  . Tetanus Toxoid     Other reaction(s): Unknown REACTION: ARM SWELLING,REDNESS  . Tetanus Toxoids   . Tramadol Nausea And Vomiting    Social History   Social History  . Marital status: Widowed    Spouse name: N/A  . Number of children: 4  . Years of education: 12   Occupational History  . Textiles     Retired  . [employer] [place]     Retired   Social  History Main Topics  . Smoking status: Former Smoker    Packs/day: 0.50    Years: 30.00    Types: Cigarettes    Quit date: 12/03/2012  . Smokeless tobacco: Never Used  . Alcohol use No  . Drug use: No  . Sexual activity: No   Other Topics Concern  . Not on file   Social History Narrative   Ms. Augustin Coupe  was born and reared in [place]. She is a widow since 18. She was married for 48 years. They had 4 children (daughters). She is currently leaving at [facility] since June. 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 She enjoys shopping. She also enjoys music, word puzzles and she loves spending time with her family. She loves attending church. One of her favorite things is wearing hats. She absolutely loves hats!     Family History  Problem Relation Age of Onset  . Heart disease Mother   . Heart disease Father   . Diabetes Brother   . Kidney disease Brother   . Stroke Daughter     due to medication reaction  . Breast cancer Cousin   . Prostate cancer Neg Hx   . Hematuria Neg Hx      ROS:  Please see the history of present illness.     All other systems reviewed and negative.    Physical Exam:  Blood pressure 140/68, pulse (!) 45, height '5\' 2"'$  (1.575 m), weight 141 lb 12 oz (64.3 kg). General: Well developed, well nourished female in no acute distress. Head: Normocephalic, atraumatic, sclera non-icteric, no xanthomas, nares are without discharge. EENT: normal  Lymph Nodes:  none Neck: Negative for carotid bruits. JVD not elevated. Back:without scoliosis kyphosis Lungs: Clear bilaterally to auscultation without wheezes, rales, or rhonchi. Breathing is unlabored. Heart: Slow but RRR with S1 S2. murmur . No rubs, or gallops appreciated. Abdomen: Soft, non-tender, non-distended with normoactive bowel sounds. No hepatomegaly. No rebound/guarding. No obvious abdominal masses. Msk:  Strength and  tone appear normal for age. Extremities: No clubbing or cyanosis. No  edema.  Distal pedal pulses are 2+ and equal bilaterally. Skin: Warm and Dry Neuro: Alert and oriented X 3. CN III-XII intact Grossly normal sensory and motor function . She walks with a stick Psych:  Responds to questions appropriately with a somewhat labile affect.      Labs: Cardiac Enzymes No results for input(s): CKTOTAL, CKMB, TROPONINI in the last 72 hours. CBC Lab Results  Component Value Date   WBC 2.2 (L) 03/21/2016   HGB 12.4 03/21/2016   HCT 36.0 03/21/2016   MCV 88.3 03/21/2016   PLT 82 (L) 03/21/2016   PROTIME: No results for input(s): LABPROT, INR in the last 72 hours. Chemistry No results for input(s): NA, K, CL, CO2, BUN, CREATININE, CALCIUM, PROT, BILITOT, ALKPHOS, ALT, AST, GLUCOSE in the last 168 hours.  Invalid input(s): LABALBU Lipids Lab Results  Component Value Date   CHOL 167 01/11/2015   HDL 54.50 01/11/2015   LDLCALC 98 01/11/2015   TRIG 72.0 01/11/2015   BNP Pro B Natriuretic peptide (BNP)  Date/Time Value Ref Range Status  01/01/2013 01:11 AM 582.3 (H) 0 - 125 pg/mL Final   Thyroid Function Tests: No results for input(s): TSH, T4TOTAL, T3FREE, THYROIDAB in the last 72 hours.  Invalid input(s): FREET3 Miscellaneous No results found for: DDIMER  Radiology/Studies:  Mm Screening Breast Tomo Bilateral  Result Date: 05/15/2016 CLINICAL DATA:  Screening. EXAM: 2D DIGITAL SCREENING BILATERAL MAMMOGRAM WITH CAD AND ADJUNCT TOMO COMPARISON:  Previous exam(s). ACR Breast Density Category c: The breast tissue is heterogeneously dense, which may obscure small masses. FINDINGS: There are no findings suspicious for malignancy. Images were processed with CAD. IMPRESSION: No mammographic evidence of malignancy. A result letter of this screening mammogram will be mailed directly to the patient. RECOMMENDATION:  Screening mammogram in one year. (Code:SM-B-01Y) BI-RADS CATEGORY  1:  Negative. Electronically Signed   By: Pamelia Hoit M.D.   On: 05/15/2016 07:36    ECG personally reviewed : * Junctional rhythm at 45 Intervals-/10[birth year]49  evidence of a retrograde P waves in the proximal ST segment   Assessment and Plan:  Sinus node dysfunction-symptomatic  Nocturnal pausing suggestive of sleep apnea  Nonsustained atrial tachycardia/fibrillation duration less than 1 minute  Sadness    The patient has symptomatic sinus node dysfunction manifested by exercise intolerance and dyspnea. I suspect that we can improve these with improve chronotropic competence and AV synchrony. The benefits and risks were reviewed including but not limited to death,  perforation, infection, lead dislodgement and device malfunction.  The patient understands agrees and is willing to proceed.  She also has nocturnal pausing suggestive of sleep apnea. I will recommend to her primary care physician the consideration of a sleep study  I was also impressed at the number of times she was in tears and she discussed her brother dying, her own health, or wanting to be her for her daughter's, and our decision to proceed with pacing. I've asked that she follow-up with Dr. Derrel Nip and her daughters as to this     Virl Axe

## 2016-05-27 ENCOUNTER — Telehealth: Payer: Self-pay | Admitting: Internal Medicine

## 2016-05-27 NOTE — Telephone Encounter (Signed)
Pt calling asking having some questions before she wants to schedule the procedure   1. how many EKG showed that she had a low heart  2. Wondering if some of the pains she's having could it be indigestion?  3. Had shingles in the past, could it be another cause of the low HR?   Please advise.

## 2016-05-29 NOTE — Telephone Encounter (Signed)
I left a message for the patient to call. 

## 2016-05-30 NOTE — Telephone Encounter (Signed)
Routed call to Alvis Lemmings, RN

## 2016-05-30 NOTE — Telephone Encounter (Signed)
Follow up ° ° ° ° ° °Returning a call to the nurse °

## 2016-05-31 NOTE — Telephone Encounter (Signed)
I called and spoke with the patient and answered her questions. She states that she has decided not to schedule her PPM placement until after she discusses with Dr. Eileen Stanford. She is scheduled to follow up on Monday 10/30 with Dr. Derrel Nip and Dr. Yvone Neu 12/7. I will await a call back from the patient.

## 2016-06-03 ENCOUNTER — Ambulatory Visit (INDEPENDENT_AMBULATORY_CARE_PROVIDER_SITE_OTHER): Payer: PPO | Admitting: Internal Medicine

## 2016-06-03 ENCOUNTER — Encounter: Payer: Self-pay | Admitting: Internal Medicine

## 2016-06-03 DIAGNOSIS — F411 Generalized anxiety disorder: Secondary | ICD-10-CM | POA: Diagnosis not present

## 2016-06-03 DIAGNOSIS — I495 Sick sinus syndrome: Secondary | ICD-10-CM

## 2016-06-03 NOTE — Progress Notes (Signed)
tomorrow Subjective:  Patient ID: Caroline Nichols, female    DOB: 11/07/1940  Age: 75 y.o. MRN: [id number]  CC: Diagnoses of Sinus node dysfunction (HCC) and Anxiety state were pertinent to this visit.  HPI Caroline Nichols presents for follow up on multiple issues,  But mostly she has questions regarding  The risks and benefits of  pacemaker placement.  She has had recent comprehensive cardiology evaluation  For recurrent fatigue and near syncope ,  And an event monitor has confirmed episodes of severe bradycardia.  Dr Caryl Comes has recommended pacemaker implantation secondary to sinus node dysfunction   Nov 24th tentative plan.  She is tearful today, worried about the risk of surgical complications.   still having shingles pain under right breast from episode several months ago,  The pain is not severe,  But aggravated by wearing a bra.  GERD: she continues to report recurrent burping and occasional regurgitation after burping.   Renal cell CA:  She has a  CT abdomen tomorrow for follow up on renal ca s/p nephrectomy    Outpatient Medications Prior to Visit  Medication Sig Dispense Refill  . acetaminophen (TYLENOL) 325 MG tablet Take 2 tablets (650 mg total) by mouth every 6 (six) hours as needed for pain.    Marland Kitchen ALPRAZolam (XANAX) 0.25 MG tablet Take 1 tablet (0.25 mg total) by mouth 2 (two) times daily as needed for anxiety. 60 tablet 2  . aspirin 81 MG tablet Take 81 mg by mouth daily.    . brimonidine (ALPHAGAN) 0.2 % ophthalmic solution Place 1 drop into both eyes 2 (two) times daily.    . Calcium-Vitamin D (SUPER CALCIUM/D) 600-125 MG-UNIT TABS Take by mouth. Taking 2 daily    . Cholecalciferol (D3-1000 PO) Take 1,000 Units by mouth daily. Reported on 07/28/2015    . ferrous sulfate 325 (65 FE) MG tablet Take 325 mg by mouth daily with breakfast.    . fluticasone (FLONASE) 50 MCG/ACT nasal spray Place 2 sprays into both nostrils daily. 16 g 6  . gabapentin (NEURONTIN) 100 MG capsule TAKE 1  CAPSULE (100 MG TOTAL) BY MOUTH 3 (THREE) TIMES DAILY. 90 capsule 2  . gabapentin (NEURONTIN) 300 MG capsule Take 1 capsule (300 mg total) by mouth 3 (three) times daily. As needed for shingles pain 90 capsule 1  . hydrALAZINE (APRESOLINE) 10 MG tablet TAKE 1 TABLET (10 MG TOTAL) BY MOUTH 2 (TWO) TIMES DAILY. 180 tablet 3  . hydroxychloroquine (PLAQUENIL) 200 MG tablet Take 200 mg by mouth daily.     Marland Kitchen latanoprost (XALATAN) 0.005 % ophthalmic solution Place 1 drop into both eyes at bedtime.     Marland Kitchen levothyroxine (SYNTHROID, LEVOTHROID) 112 MCG tablet TAKE 1 TABLET  BY MOUTH DAILY BEFORE BREAKFAST. 90 tablet 0  . losartan (COZAAR) 100 MG tablet Take 1 tablet (100 mg total) by mouth daily. NOTE DOSE CHANGE TO 100 MG DAILY 90 tablet 1  . mirabegron ER (MYRBETRIQ) 50 MG TB24 tablet Take 1 tablet (50 mg total) by mouth daily. 30 tablet 2  . mirtazapine (REMERON) 7.5 MG tablet TAKE 1 TABLET (7.5 MG TOTAL) BY MOUTH AT BEDTIME. 30 tablet 3  . nitroGLYCERIN (NITROLINGUAL) 0.4 MG/SPRAY spray Place 1 spray under the tongue every 5 (five) minutes x 3 doses as needed for chest pain. 4.9 g 0  . Omega-3 Fatty Acids (FISH OIL) 1000 MG CAPS Take 1,000 mg by mouth daily.     . pantoprazole (PROTONIX) 40 MG tablet TAKE 1  TABLET (40 MG TOTAL) BY MOUTH DAILY. 30 tablet 4  . vitamin C (ASCORBIC ACID) 500 MG tablet Take 500 mg by mouth daily.     Facility-Administered Medications Prior to Visit  Medication Dose Route Frequency Provider Last Rate Last Dose  . aspirin chewable tablet 81 mg  81 mg Oral Once Wende Bushy, MD        Review of Systems;  Patient denies headache, fevers, malaise, unintentional weight loss, skin rash, eye pain, sinus congestion and sinus pain, sore throat, dysphagia,  hemoptysis , cough, dyspnea, wheezing, chest pain, palpitations, orthopnea, edema, abdominal pain, nausea, melena, diarrhea, constipation, flank pain, dysuria, hematuria, urinary  Frequency, nocturia, numbness, tingling, seizures,   Focal weakness, Loss of consciousness,  Tremor, insomnia, depression,, and suicidal ideation.      Objective:  BP (!) 168/102   Pulse 75   Temp 98.1 F (36.7 C) (Oral)   Resp 15   Ht '5\' 2"'$  (1.575 m)   Wt 140 lb 6.4 oz (63.7 kg)   SpO2 95%   BMI 25.68 kg/m   BP Readings from Last 3 Encounters:  06/03/16 (!) 168/102  05/23/16 140/68  04/23/16 114/64    Wt Readings from Last 3 Encounters:  06/03/16 140 lb 6.4 oz (63.7 kg)  05/23/16 141 lb 12 oz (64.3 kg)  04/23/16 141 lb 4 oz (64.1 kg)    General appearance: alert, cooperative and appears stated age Back: symmetric, no curvature. ROM normal. No CVA tenderness. Lungs: clear to auscultation bilaterally Heart: regular rate and rhythm, S1, S2 normal, no murmur, click, rub or gallop Abdomen: soft, non-tender; bowel sounds normal; no masses,  no organomegaly Pulses: 2+ and symmetric Skin: Skin color, texture, turgor normal. No rashes or lesions Lymph nodes: Cervical, supraclavicular, and axillary nodes normal.  Lab Results  Component Value Date   HGBA1C 5.3 06/09/2014   HGBA1C 5.2 12/27/2012    Lab Results  Component Value Date   CREATININE 0.73 12/06/2015   CREATININE 0.48 11/03/2015   CREATININE 0.52 11/02/2015    Lab Results  Component Value Date   WBC 2.2 (L) 03/21/2016   HGB 12.4 03/21/2016   HCT 36.0 03/21/2016   PLT 82 (L) 03/21/2016   GLUCOSE 104 (H) 12/06/2015   CHOL 167 01/11/2015   TRIG 72.0 01/11/2015   HDL 54.50 01/11/2015   LDLCALC 98 01/11/2015   ALT 23 07/11/2015   AST 42 (H) 07/11/2015   NA 143 12/06/2015   K 3.8 12/06/2015   CL 104 12/06/2015   CREATININE 0.73 12/06/2015   BUN 12 12/06/2015   CO2 23 12/06/2015   TSH 1.63 07/11/2015   INR 1.11 10/23/2015   HGBA1C 5.3 06/09/2014   MICROALBUR 0.7 06/09/2014    Mm Screening Breast Tomo Bilateral  Result Date: 05/15/2016 CLINICAL DATA:  Screening. EXAM: 2D DIGITAL SCREENING BILATERAL MAMMOGRAM WITH CAD AND ADJUNCT TOMO COMPARISON:   Previous exam(s). ACR Breast Density Category c: The breast tissue is heterogeneously dense, which may obscure small masses. FINDINGS: There are no findings suspicious for malignancy. Images were processed with CAD. IMPRESSION: No mammographic evidence of malignancy. A result letter of this screening mammogram will be mailed directly to the patient. RECOMMENDATION: Screening mammogram in one year. (Code:SM-B-01Y) BI-RADS CATEGORY  1: Negative. Electronically Signed   By: Pamelia Hoit M.D.   On: 05/15/2016 07:36    Assessment & Plan:   Problem List Items Addressed This Visit    Anxiety state    With insomnia.  She has reduced  her use of alprazolam to twice daily starting with suspension of evening dose and use of 15 mg remeron.          Sinus node dysfunction (HCC)    Risks and benefits of pacemaker implantation reviewed with patient today, patient is now comfortabel with plan and hopes to have proceudure on nov 24rh.        Other Visit Diagnoses   None.    A total of 25 minutes of face to face time was spent with patient more than half of which was spent in counselling about the above mentioned conditions  and coordination of care    I am having Ms. Lutter maintain her Fish Oil, Calcium-Vitamin D, acetaminophen, vitamin C, ferrous sulfate, Cholecalciferol (D3-1000 PO), brimonidine, fluticasone, pantoprazole, mirabegron ER, hydroxychloroquine, latanoprost, gabapentin, losartan, levothyroxine, hydrALAZINE, mirtazapine, ALPRAZolam, nitroGLYCERIN, gabapentin, and aspirin. We will continue to administer aspirin.  No orders of the defined types were placed in this encounter.   There are no discontinued medications.  Follow-up: No Follow-up on file.   [name], MD

## 2016-06-03 NOTE — Progress Notes (Signed)
Pre visit review using our clinic review tool, if applicable. No additional management support is needed unless otherwise documented below in the visit note. 

## 2016-06-04 ENCOUNTER — Telehealth: Payer: Self-pay | Admitting: Internal Medicine

## 2016-06-04 ENCOUNTER — Encounter: Payer: Self-pay | Admitting: Internal Medicine

## 2016-06-04 ENCOUNTER — Ambulatory Visit
Admission: RE | Admit: 2016-06-04 | Discharge: 2016-06-04 | Disposition: A | Discharge status: Home / Self Care | Payer: PPO | Source: Ambulatory Visit | Attending: Urology | Admitting: Urology

## 2016-06-04 DIAGNOSIS — I2510 Atherosclerotic heart disease of native coronary artery without angina pectoris: Secondary | ICD-10-CM | POA: Insufficient documentation

## 2016-06-04 DIAGNOSIS — Z905 Acquired absence of kidney: Secondary | ICD-10-CM | POA: Diagnosis not present

## 2016-06-04 DIAGNOSIS — I700 Atherosclerosis of aorta: Secondary | ICD-10-CM | POA: Insufficient documentation

## 2016-06-04 DIAGNOSIS — N8189 Other female genital prolapse: Secondary | ICD-10-CM | POA: Insufficient documentation

## 2016-06-04 DIAGNOSIS — C641 Malignant neoplasm of right kidney, except renal pelvis: Secondary | ICD-10-CM | POA: Diagnosis not present

## 2016-06-04 HISTORY — DX: Malignant neoplasm of right kidney, except renal pelvis: C64.1

## 2016-06-04 LAB — POCT I-STAT CREATININE: Creatinine, Ser: 0.9 mg/dL (ref 0.44–1.00)

## 2016-06-04 MED ORDER — IOPAMIDOL (ISOVUE-370) INJECTION 76%
100.0000 mL | Freq: Once | INTRAVENOUS | Status: AC | PRN
  Administered 2016-06-04: 100 mL via INTRAVENOUS

## 2016-06-04 NOTE — Assessment & Plan Note (Signed)
Risks and benefits of pacemaker implantation reviewed with patient today, patient is now comfortabel with plan and hopes to have proceudure on nov 24rh.

## 2016-06-04 NOTE — Assessment & Plan Note (Signed)
With insomnia.  She has reduced her use of alprazolam to twice daily starting with suspension of evening dose and use of 15 mg remeron.

## 2016-06-04 NOTE — Telephone Encounter (Signed)
Pt calling us to see if we can lock her for a procedure on the 24 th  Please call back

## 2016-06-05 ENCOUNTER — Ambulatory Visit: Payer: PPO | Admitting: Cardiology

## 2016-06-05 NOTE — Telephone Encounter (Signed)
I called and spoke with the patient. She is ready to schedule her PPM for 11/24 with Dr. Caryl Comes. I advised her I will arrange this for her and call her back to confirm the date/ time.

## 2016-06-06 ENCOUNTER — Other Ambulatory Visit: Payer: Self-pay | Admitting: Internal Medicine

## 2016-06-06 NOTE — Telephone Encounter (Signed)
I left a message for the patient that I have scheduled her PPM for 06/28/16 with Dr. Caryl Comes- she will need to arrive at 9:30 am for an 11:30 am case. I advised I will mail her a detailed letter of instructions.

## 2016-06-07 ENCOUNTER — Ambulatory Visit (INDEPENDENT_AMBULATORY_CARE_PROVIDER_SITE_OTHER): Payer: PPO | Admitting: Urology

## 2016-06-07 ENCOUNTER — Ambulatory Visit
Admission: RE | Admit: 2016-06-07 | Discharge: 2016-06-07 | Disposition: A | Discharge status: Home / Self Care | Payer: PPO | Source: Ambulatory Visit | Attending: Urology | Admitting: Urology

## 2016-06-07 VITALS — BP 174/78 | HR 76 | Ht 62.0 in | Wt 139.0 lb

## 2016-06-07 DIAGNOSIS — Z85528 Personal history of other malignant neoplasm of kidney: Secondary | ICD-10-CM | POA: Diagnosis present

## 2016-06-07 DIAGNOSIS — I700 Atherosclerosis of aorta: Secondary | ICD-10-CM | POA: Insufficient documentation

## 2016-06-07 NOTE — Progress Notes (Signed)
06/07/2016 10:31 AM   Caroline Nichols 1941-04-04 [id number]  Referring provider: [name], MD [place], [place] [address]  Chief Complaint  Patient presents with  . Follow-up    2monthw/results    HPI: 75year old female status post right robotic partial nephrectomy on 10/23/2015.   Surgical pathology consistent with clear cell papillary renal cell carcinoma, negative margins, 2 cm, Fuhrman grade 2, negative margins. pT1aN0 M0.   She reports she is having issues with fatigue and will be getting a pacemaker for bradycardia.    She does have OAB on mybetriq which is helping some.  No urge incontinence.    No flank pain or hematuria.    PMH: Past Medical History:  Diagnosis Date  . Allergy   . Anemia 02/2013   F/U with Dr. FGrayland Ormondfor Feraheme injections  . Anxiety   . Arthritis    Possible RA - Follow up appt with Dr. KJefm Bryant . Cellulitis of arm, right   . Chronic kidney disease   . Diabetes insipidus ([place]    On desmopressin  . GERD (gastroesophageal reflux disease)   . H/O seasonal allergies   . H/O transfusion of packed red blood cells 02/2013   3 units PRBC for severe anemia 02/2013  . Herniated disc   . History of kidney stones   . Hyperlipidemia   . Hypertension   . Hyperthyroidism    s/p radiation therapy, now hypothyroidism  . IBS (irritable bowel syndrome)   . Migraine   . Pancreatic cyst   . Paroxysmal a-fib ([facility])   . [place], [place] 10/23/2015   Right partial nephrectomy.    Surgical History: Past Surgical History:  Procedure Laterality Date  . ABDOMINAL HYSTERECTOMY    . BREAST EXCISIONAL BIOPSY Right 2005   neg  . BREAST SURGERY  1990s   Biopsy  . CERVICAL SPINE SURGERY     Bone fusion  . HAMMER TOE SURGERY    . HERNIA REPAIR Left    Inguinal Hernia Repair  . ROBOTIC ASSITED PARTIAL NEPHRECTOMY Right 10/23/2015   Procedure: ROBOTIC ASSITED PARTIAL NEPHRECTOMY with intraop ultrasound;   Surgeon: AHollice Espy MD;  Location: [facility] ORS;  Service: Urology;  Laterality: Right;  . TUBAL LIGATION      Home Medications:    Medication List       Accurate as of 06/07/16 10:31 AM. Always use your most recent med list.          acetaminophen 325 MG tablet Commonly known as:  TYLENOL Take 2 tablets (650 mg total) by mouth every 6 (six) hours as needed for pain.   ALPRAZolam 0.25 MG tablet Commonly known as:  XANAX Take 1 tablet (0.25 mg total) by mouth 2 (two) times daily as needed for anxiety.   aspirin 81 MG tablet Take 81 mg by mouth daily.   brimonidine 0.2 % ophthalmic solution Commonly known as:  ALPHAGAN Place 1 drop into both eyes 2 (two) times daily.   D3-1000 PO Take 1,000 Units by mouth daily. Reported on 07/28/2015   ferrous sulfate 325 (65 FE) MG tablet Take 325 mg by mouth daily with breakfast.   Fish Oil 1000 MG Caps Take 1,000 mg by mouth daily.   fluticasone 50 MCG/ACT nasal spray Commonly known as:  FLONASE Place 2 sprays into both nostrils daily.   gabapentin 300 MG capsule Commonly known as:  NEURONTIN Take 1 capsule (300 mg total) by mouth 3 (  three) times daily. As needed for shingles pain   gabapentin 100 MG capsule Commonly known as:  NEURONTIN TAKE 1 CAPSULE (100 MG TOTAL) BY MOUTH 3 (THREE) TIMES DAILY.   hydrALAZINE 10 MG tablet Commonly known as:  APRESOLINE TAKE 1 TABLET (10 MG TOTAL) BY MOUTH 2 (TWO) TIMES DAILY.   hydroxychloroquine 200 MG tablet Commonly known as:  PLAQUENIL Take 200 mg by mouth daily.   latanoprost 0.005 % ophthalmic solution Commonly known as:  XALATAN Place 1 drop into both eyes at bedtime.   levothyroxine 112 MCG tablet Commonly known as:  SYNTHROID, LEVOTHROID TAKE 1 TABLET  BY MOUTH DAILY BEFORE BREAKFAST.   losartan 100 MG tablet Commonly known as:  COZAAR Take 1 tablet (100 mg total) by mouth daily. NOTE DOSE CHANGE TO 100 MG DAILY   mirabegron ER 50 MG Tb24 tablet Commonly known as:   MYRBETRIQ Take 1 tablet (50 mg total) by mouth daily.   mirtazapine 7.5 MG tablet Commonly known as:  REMERON TAKE 1 TABLET (7.5 MG TOTAL) BY MOUTH AT BEDTIME.   nitroGLYCERIN 0.4 MG/SPRAY spray Commonly known as:  NITROLINGUAL Place 1 spray under the tongue every 5 (five) minutes x 3 doses as needed for chest pain.   pantoprazole 40 MG tablet Commonly known as:  PROTONIX TAKE 1 TABLET (40 MG TOTAL) BY MOUTH DAILY.   SUPER CALCIUM/D 600-125 MG-UNIT Tabs Generic drug:  Calcium-Vitamin D Take by mouth. Taking 2 daily   vitamin C 500 MG tablet Commonly known as:  ASCORBIC ACID Take 500 mg by mouth daily.       Allergies:  Allergies  Allergen Reactions  . Adhesive [Tape] Other (See Comments)    "irritate skin"  . Codeine     REACTION: NAUSEA  . Morphine Other (See Comments)  . Morphine And Related Other (See Comments)    Shut down her organs  . Tetanus Toxoid     Other reaction(s): Unknown REACTION: ARM SWELLING,REDNESS  . Tetanus Toxoids   . Tramadol Nausea And Vomiting    Family History: Family History  Problem Relation Age of Onset  . Heart disease Mother   . Heart disease Father   . Diabetes Brother   . Kidney disease Brother   . Stroke Daughter     due to medication reaction  . Breast cancer Cousin   . Prostate cancer Neg Hx   . Hematuria Neg Hx     Social History:  reports that she quit smoking about 3 years ago. Her smoking use included Cigarettes. She has a 15.00 pack-year smoking history. She has never used smokeless tobacco. She reports that she does not drink alcohol or use drugs.  ROS: UROLOGY Frequent Urination?: Yes Hard to postpone urination?: No Burning/pain with urination?: No Get up at night to urinate?: Yes Leakage of urine?: No Urine stream starts and stops?: Yes Trouble starting stream?: No Do you have to strain to urinate?: No Blood in urine?: No Urinary tract infection?: No Sexually transmitted disease?: No Injury to kidneys  or bladder?: No Painful intercourse?: No Weak stream?: No Currently pregnant?: No Vaginal bleeding?: No Last menstrual period?: n  Gastrointestinal Nausea?: No Vomiting?: No Indigestion/heartburn?: No Diarrhea?: No Constipation?: No  Constitutional Fever: No Night sweats?: Yes Weight loss?: No Fatigue?: Yes  Skin Skin rash/lesions?: No Itching?: No  Eyes Blurred vision?: Yes Double vision?: No  Ears/Nose/Throat Sore throat?: No Sinus problems?: Yes  Hematologic/Lymphatic Swollen glands?: No Easy bruising?: Yes  Cardiovascular Leg swelling?: No Chest pain?:  No  Respiratory Cough?: No Shortness of breath?: Yes  Endocrine Excessive thirst?: Yes  Musculoskeletal Back pain?: Yes Joint pain?: Yes  Neurological Headaches?: Yes Dizziness?: No  Psychologic Depression?: No Anxiety?: Yes  Physical Exam: BP (!) 174/78   Pulse 76   Ht '5\' 2"'$  (1.575 m)   Wt 139 lb (63 kg)   BMI 25.42 kg/m   Constitutional:  Alert and oriented, No acute distress.  Well dressed. Ambulating independently with cane. HEENT: [place] AT, moist mucus membranes.  Trachea midline, no masses. Cardiovascular: No clubbing, cyanosis, or edema. Respiratory: Normal respiratory effort, no increased work of breathing. GI: Abdomen is soft, nontender, nondistended, no abdominal masses.  Abdominal incisions well-healed scars. GU: No CVA tenderness.  Skin: No rashes, bruises or suspicious lesions. Neurologic: Grossly intact, no focal deficits, moving all 4 extremities. Psychiatric: Normal mood and affect.  Laboratory Data: Lab Results  Component Value Date   WBC 2.2 (L) 03/21/2016   HGB 12.4 03/21/2016   HCT 36.0 03/21/2016   MCV 88.3 03/21/2016   PLT 82 (L) 03/21/2016    Lab Results  Component Value Date   CREATININE 0.90 06/04/2016    Lab Results  Component Value Date   HGBA1C 5.3 06/09/2014    Urinalysis Results for orders placed or performed during the hospital encounter of  06/04/16  I-STAT creatinine  Result Value Ref Range   Creatinine, Ser 0.90 0.44 - 1.00 mg/dL   Imaging: CLINICAL DATA:  Partial right nephrectomy 10/23/2015 for renal cell carcinoma. Asymptomatic. Partial hysterectomy.  EXAM: CT ABDOMEN AND PELVIS WITHOUT AND WITH CONTRAST  TECHNIQUE: Multidetector CT imaging of the abdomen and pelvis was performed following the standard protocol before and following the bolus administration of intravenous contrast.  CONTRAST:  100 cc of Isovue 370  COMPARISON:  09/27/2015 MRI.  CT of 06/16/2015.  FINDINGS: Lower chest: Clear lung bases. Mild cardiomegaly. Multivessel coronary artery atherosclerosis.  Hepatobiliary: Normal liver. Normal gallbladder, without biliary ductal dilatation.  Pancreas: Normal, without mass or ductal dilatation.  Spleen: Normal in size, without focal abnormality.  Adrenals/Urinary Tract: Normal adrenal glands. No renal calculi or hydronephrosis. upper pole right renal cyst measures 2.3 cm. Interval lower pole right partial nephrectomy. No residual or locally recurrent disease. No acute postoperative complication. Normal urinary bladder.  Stomach/Bowel: Normal stomach, without wall thickening. Scattered colonic diverticula. Normal terminal ileum. Normal small bowel.  Vascular/Lymphatic: Advanced aortic and branch vessel atherosclerosis. Patent renal veins. No abdominopelvic adenopathy.  Reproductive: Hysterectomy. Prominent gonadal veins. No adnexal mass.  Other: No significant free fluid. Mild pelvic floor laxity. Left inguinal hernia repair.  Musculoskeletal: Lumbosacral spondylosis.  IMPRESSION: 1. Status post partial right nephrectomy, without recurrent or metastatic disease. 2.  Coronary artery atherosclerosis. Aortic atherosclerosis. 3. Pelvic floor laxity. 4. Prominent gonadal veins, as can be seen with pelvic congestion syndrome.   Electronically Signed   By: Abigail Miyamoto  M.D.   On: 06/04/2016 13:53  CT personally reviewed today  Assessment & Plan:    1. Renal cell carcinoma, right ([place]) S/p R partial nephrectomy 10/23/15 pT1N0M0 NED- low risk of recurrence Recommend f/u CT abd/ pelvis with and without contrast annually x 3 years with CXR Due for CXR today   2. OAB/ urinary incontience Doing better on mybetriq- continue    Return in about 1 year (around 06/07/2017) for f/u CT scan/ CXR.  Hollice Espy, MD  [facility] [address] [place], [place] [address] [phone]

## 2016-06-08 ENCOUNTER — Other Ambulatory Visit: Payer: Self-pay | Admitting: Internal Medicine

## 2016-06-13 ENCOUNTER — Encounter: Payer: Self-pay | Admitting: *Deleted

## 2016-06-21 ENCOUNTER — Encounter: Payer: Self-pay | Admitting: Internal Medicine

## 2016-06-21 ENCOUNTER — Inpatient Hospital Stay: Payer: PPO | Attending: Oncology | Admitting: *Deleted

## 2016-06-21 DIAGNOSIS — I495 Sick sinus syndrome: Secondary | ICD-10-CM

## 2016-06-21 DIAGNOSIS — D61818 Other pancytopenia: Secondary | ICD-10-CM

## 2016-06-21 DIAGNOSIS — Z01812 Encounter for preprocedural laboratory examination: Secondary | ICD-10-CM

## 2016-06-21 DIAGNOSIS — C641 Malignant neoplasm of right kidney, except renal pelvis: Secondary | ICD-10-CM | POA: Diagnosis not present

## 2016-06-21 LAB — CBC WITH DIFFERENTIAL/PLATELET
BASOS PCT: 0 %
Basophils Absolute: 0 10*3/uL (ref 0–0.1)
EOS ABS: 0 10*3/uL (ref 0–0.7)
EOS PCT: 1 %
HCT: 34.6 % — ABNORMAL LOW (ref 35.0–47.0)
Hemoglobin: 11.7 g/dL — ABNORMAL LOW (ref 12.0–16.0)
Lymphocytes Relative: 43 %
Lymphs Abs: 1.2 10*3/uL (ref 1.0–3.6)
MCH: 30.3 pg (ref 26.0–34.0)
MCHC: 33.9 g/dL (ref 32.0–36.0)
MCV: 89.5 fL (ref 80.0–100.0)
MONO ABS: 0.7 10*3/uL (ref 0.2–0.9)
MONOS PCT: 27 %
NEUTROS PCT: 29 %
Neutro Abs: 0.8 10*3/uL — ABNORMAL LOW (ref 1.4–6.5)
PLATELETS: 118 10*3/uL — AB (ref 150–440)
RBC: 3.86 MIL/uL (ref 3.80–5.20)
RDW: 15.2 % — AB (ref 11.5–14.5)
WBC: 2.7 10*3/uL — ABNORMAL LOW (ref 3.6–11.0)

## 2016-06-21 LAB — PROTIME-INR
INR: 1.02
PROTHROMBIN TIME: 13.4 s (ref 11.4–15.2)

## 2016-06-21 LAB — BASIC METABOLIC PANEL
Anion gap: 6 (ref 5–15)
BUN: 17 mg/dL (ref 6–20)
CALCIUM: 9.1 mg/dL (ref 8.9–10.3)
CO2: 26 mmol/L (ref 22–32)
Chloride: 106 mmol/L (ref 101–111)
Creatinine, Ser: 0.78 mg/dL (ref 0.44–1.00)
GFR calc Af Amer: >60 mL/min (ref >60)
GLUCOSE: 166 mg/dL — AB (ref 65–99)
Potassium: 3.8 mmol/L (ref 3.5–5.1)
Sodium: 138 mmol/L (ref 135–145)

## 2016-06-24 ENCOUNTER — Telehealth: Payer: Self-pay | Admitting: Internal Medicine

## 2016-06-24 NOTE — Telephone Encounter (Signed)
Patient is getting nervous about upcoming ppm insertion and wants to be sure that she will be able to perform ADL's  After procedure. Please call to discuss.

## 2016-06-25 NOTE — Telephone Encounter (Signed)
I called and spoke with the patient and answered her questions concerning her device implant. I inquired if a I also needed to call her daughter and she stated I did not.

## 2016-06-25 NOTE — Telephone Encounter (Signed)
Pt daughter calling stating she would like the call back  Would like to know what to expect after procedure on Friday.  Please call back [phone]

## 2016-06-28 ENCOUNTER — Ambulatory Visit (HOSPITAL_COMMUNITY)
Admission: RE | Admit: 2016-06-28 | Discharge: 2016-06-29 | Disposition: A | Discharge status: Home / Self Care | Payer: PPO | Source: Ambulatory Visit | Attending: Internal Medicine | Admitting: Internal Medicine

## 2016-06-28 ENCOUNTER — Other Ambulatory Visit: Payer: Self-pay

## 2016-06-28 ENCOUNTER — Encounter (HOSPITAL_COMMUNITY)
Admission: RE | Disposition: A | Discharge status: Home / Self Care | Payer: Self-pay | Source: Ambulatory Visit | Attending: Internal Medicine

## 2016-06-28 DIAGNOSIS — I495 Sick sinus syndrome: Secondary | ICD-10-CM | POA: Diagnosis present

## 2016-06-28 DIAGNOSIS — N189 Chronic kidney disease, unspecified: Secondary | ICD-10-CM | POA: Insufficient documentation

## 2016-06-28 DIAGNOSIS — E785 Hyperlipidemia, unspecified: Secondary | ICD-10-CM | POA: Diagnosis not present

## 2016-06-28 DIAGNOSIS — E039 Hypothyroidism, unspecified: Secondary | ICD-10-CM | POA: Insufficient documentation

## 2016-06-28 DIAGNOSIS — I129 Hypertensive chronic kidney disease with stage 1 through stage 4 chronic kidney disease, or unspecified chronic kidney disease: Secondary | ICD-10-CM | POA: Insufficient documentation

## 2016-06-28 DIAGNOSIS — E232 Diabetes insipidus: Secondary | ICD-10-CM | POA: Diagnosis present

## 2016-06-28 DIAGNOSIS — Z905 Acquired absence of kidney: Secondary | ICD-10-CM | POA: Diagnosis not present

## 2016-06-28 DIAGNOSIS — Z87891 Personal history of nicotine dependence: Secondary | ICD-10-CM | POA: Insufficient documentation

## 2016-06-28 DIAGNOSIS — F329 Major depressive disorder, single episode, unspecified: Secondary | ICD-10-CM | POA: Diagnosis present

## 2016-06-28 DIAGNOSIS — I10 Essential (primary) hypertension: Secondary | ICD-10-CM | POA: Diagnosis present

## 2016-06-28 DIAGNOSIS — K219 Gastro-esophageal reflux disease without esophagitis: Secondary | ICD-10-CM | POA: Diagnosis not present

## 2016-06-28 DIAGNOSIS — Z950 Presence of cardiac pacemaker: Secondary | ICD-10-CM | POA: Diagnosis not present

## 2016-06-28 DIAGNOSIS — I471 Supraventricular tachycardia: Secondary | ICD-10-CM | POA: Insufficient documentation

## 2016-06-28 DIAGNOSIS — E0590 Thyrotoxicosis, unspecified without thyrotoxic crisis or storm: Secondary | ICD-10-CM | POA: Diagnosis not present

## 2016-06-28 DIAGNOSIS — C641 Malignant neoplasm of right kidney, except renal pelvis: Secondary | ICD-10-CM

## 2016-06-28 DIAGNOSIS — I480 Paroxysmal atrial fibrillation: Secondary | ICD-10-CM | POA: Diagnosis not present

## 2016-06-28 DIAGNOSIS — Z7982 Long term (current) use of aspirin: Secondary | ICD-10-CM | POA: Diagnosis not present

## 2016-06-28 DIAGNOSIS — Z85528 Personal history of other malignant neoplasm of kidney: Secondary | ICD-10-CM | POA: Diagnosis present

## 2016-06-28 DIAGNOSIS — I499 Cardiac arrhythmia, unspecified: Secondary | ICD-10-CM

## 2016-06-28 DIAGNOSIS — Z959 Presence of cardiac and vascular implant and graft, unspecified: Secondary | ICD-10-CM

## 2016-06-28 HISTORY — DX: Presence of cardiac pacemaker: Z95.0

## 2016-06-28 HISTORY — PX: EP IMPLANTABLE DEVICE: SHX172B

## 2016-06-28 LAB — SURGICAL PCR SCREEN
MRSA, PCR: NEGATIVE
STAPHYLOCOCCUS AUREUS: POSITIVE — AB

## 2016-06-28 SURGERY — PACEMAKER IMPLANT
Anesthesia: LOCAL

## 2016-06-28 MED ORDER — FENTANYL CITRATE (PF) 100 MCG/2ML IJ SOLN
INTRAMUSCULAR | Status: AC
  Filled 2016-06-28: qty 2

## 2016-06-28 MED ORDER — HEPARIN (PORCINE) IN NACL 2-0.9 UNIT/ML-% IJ SOLN
INTRAMUSCULAR | Status: DC | PRN
  Administered 2016-06-28: 500 mL

## 2016-06-28 MED ORDER — HEPARIN (PORCINE) IN NACL 2-0.9 UNIT/ML-% IJ SOLN
INTRAMUSCULAR | Status: AC
  Filled 2016-06-28: qty 500

## 2016-06-28 MED ORDER — LABETALOL HCL 5 MG/ML IV SOLN
INTRAVENOUS | Status: AC
  Filled 2016-06-28: qty 4

## 2016-06-28 MED ORDER — OMEGA-3-ACID ETHYL ESTERS 1 G PO CAPS
1000.0000 mg | ORAL_CAPSULE | Freq: Every day | ORAL | Status: DC
  Administered 2016-06-29: 1000 mg via ORAL
  Filled 2016-06-28: qty 1

## 2016-06-28 MED ORDER — CHLORHEXIDINE GLUCONATE CLOTH 2 % EX PADS: 6.0000 | MEDICATED_PAD | Freq: Every day | CUTANEOUS | Status: DC

## 2016-06-28 MED ORDER — MIRTAZAPINE 15 MG PO TABS
7.5000 mg | ORAL_TABLET | Freq: Every day | ORAL | Status: DC
  Administered 2016-06-28: 7.5 mg via ORAL
  Filled 2016-06-28: qty 1

## 2016-06-28 MED ORDER — FERROUS SULFATE 325 (65 FE) MG PO TABS
325.0000 mg | ORAL_TABLET | Freq: Every day | ORAL | Status: DC
  Administered 2016-06-29: 325 mg via ORAL
  Filled 2016-06-28: qty 1

## 2016-06-28 MED ORDER — CALCIUM CARBONATE-VITAMIN D 500-200 MG-UNIT PO TABS
2.0000 | ORAL_TABLET | Freq: Every day | ORAL | Status: DC
  Administered 2016-06-29: 2 via ORAL
  Filled 2016-06-28: qty 2

## 2016-06-28 MED ORDER — MUPIROCIN 2 % EX OINT
TOPICAL_OINTMENT | CUTANEOUS | Status: AC
  Administered 2016-06-28: 1 via TOPICAL
  Filled 2016-06-28: qty 22

## 2016-06-28 MED ORDER — LIDOCAINE HCL (PF) 1 % IJ SOLN
INTRAMUSCULAR | Status: AC
  Filled 2016-06-28: qty 60

## 2016-06-28 MED ORDER — BRIMONIDINE TARTRATE 0.2 % OP SOLN
1.0000 [drp] | Freq: Two times a day (BID) | OPHTHALMIC | Status: DC
  Administered 2016-06-28 – 2016-06-29 (×2): 1 [drp] via OPHTHALMIC
  Filled 2016-06-28: qty 5

## 2016-06-28 MED ORDER — GABAPENTIN 300 MG PO CAPS
300.0000 mg | ORAL_CAPSULE | Freq: Three times a day (TID) | ORAL | Status: DC
  Administered 2016-06-28 – 2016-06-29 (×3): 300 mg via ORAL
  Filled 2016-06-28 (×3): qty 1

## 2016-06-28 MED ORDER — SODIUM CHLORIDE 0.9 % IV SOLN
INTRAVENOUS | Status: AC
  Administered 2016-06-28: 17:00:00 via INTRAVENOUS

## 2016-06-28 MED ORDER — CEFAZOLIN SODIUM-DEXTROSE 2-4 GM/100ML-% IV SOLN
2.0000 g | INTRAVENOUS | Status: AC
  Administered 2016-06-28: 2 g via INTRAVENOUS

## 2016-06-28 MED ORDER — MIDAZOLAM HCL 5 MG/5ML IJ SOLN
INTRAMUSCULAR | Status: DC | PRN
  Administered 2016-06-28 (×2): 1 mg via INTRAVENOUS

## 2016-06-28 MED ORDER — ASPIRIN 81 MG PO CHEW
81.0000 mg | CHEWABLE_TABLET | Freq: Every day | ORAL | Status: DC
  Administered 2016-06-28 – 2016-06-29 (×2): 81 mg via ORAL
  Filled 2016-06-28 (×2): qty 1

## 2016-06-28 MED ORDER — LIDOCAINE HCL (PF) 1 % IJ SOLN
INTRAMUSCULAR | Status: DC | PRN
  Administered 2016-06-28: 45 mL

## 2016-06-28 MED ORDER — ALPRAZOLAM 0.25 MG PO TABS
0.2500 mg | ORAL_TABLET | Freq: Two times a day (BID) | ORAL | Status: DC | PRN
  Administered 2016-06-28 – 2016-06-29 (×2): 0.25 mg via ORAL
  Filled 2016-06-28 (×2): qty 1

## 2016-06-28 MED ORDER — LATANOPROST 0.005 % OP SOLN
1.0000 [drp] | Freq: Every day | OPHTHALMIC | Status: DC
  Administered 2016-06-28: 1 [drp] via OPHTHALMIC
  Filled 2016-06-28: qty 2.5

## 2016-06-28 MED ORDER — MIDAZOLAM HCL 5 MG/5ML IJ SOLN
INTRAMUSCULAR | Status: AC
  Filled 2016-06-28: qty 5

## 2016-06-28 MED ORDER — HYDROXYCHLOROQUINE SULFATE 200 MG PO TABS
400.0000 mg | ORAL_TABLET | Freq: Every day | ORAL | Status: DC
  Administered 2016-06-29: 400 mg via ORAL
  Filled 2016-06-28: qty 2

## 2016-06-28 MED ORDER — VITAMIN C 500 MG PO TABS
500.0000 mg | ORAL_TABLET | Freq: Every day | ORAL | Status: DC
  Administered 2016-06-29: 500 mg via ORAL
  Filled 2016-06-28: qty 1

## 2016-06-28 MED ORDER — PANTOPRAZOLE SODIUM 40 MG PO TBEC
40.0000 mg | DELAYED_RELEASE_TABLET | Freq: Every day | ORAL | Status: DC
  Administered 2016-06-29: 40 mg via ORAL
  Filled 2016-06-28: qty 1

## 2016-06-28 MED ORDER — CEFAZOLIN SODIUM-DEXTROSE 2-4 GM/100ML-% IV SOLN
INTRAVENOUS | Status: AC
  Filled 2016-06-28: qty 100

## 2016-06-28 MED ORDER — ACETAMINOPHEN 325 MG PO TABS: 325.0000 mg | ORAL_TABLET | ORAL | Status: DC | PRN

## 2016-06-28 MED ORDER — SODIUM CHLORIDE 0.9 % IR SOLN
80.0000 mg | Status: AC
  Administered 2016-06-28: 80 mg

## 2016-06-28 MED ORDER — LABETALOL HCL 5 MG/ML IV SOLN
INTRAVENOUS | Status: DC | PRN
  Administered 2016-06-28: 20 mg via INTRAVENOUS

## 2016-06-28 MED ORDER — CEFAZOLIN IN D5W 1 GM/50ML IV SOLN
1.0000 g | Freq: Four times a day (QID) | INTRAVENOUS | Status: AC
  Administered 2016-06-28 – 2016-06-29 (×3): 1 g via INTRAVENOUS
  Filled 2016-06-28 (×3): qty 50

## 2016-06-28 MED ORDER — MUPIROCIN 2 % EX OINT
1.0000 "application " | TOPICAL_OINTMENT | Freq: Two times a day (BID) | CUTANEOUS | Status: DC
  Administered 2016-06-28: 1 via NASAL
  Filled 2016-06-28: qty 22

## 2016-06-28 MED ORDER — NITROGLYCERIN 0.4 MG/SPRAY TL SOLN
1.0000 | Status: DC | PRN
  Filled 2016-06-28: qty 4.9

## 2016-06-28 MED ORDER — MIRABEGRON ER 50 MG PO TB24
50.0000 mg | ORAL_TABLET | Freq: Every evening | ORAL | Status: DC
  Administered 2016-06-28: 50 mg via ORAL
  Filled 2016-06-28 (×2): qty 1

## 2016-06-28 MED ORDER — SODIUM CHLORIDE 0.9 % IV SOLN
INTRAVENOUS | Status: DC
  Administered 2016-06-28: 11:00:00 via INTRAVENOUS

## 2016-06-28 MED ORDER — ONDANSETRON HCL 4 MG/2ML IJ SOLN: 4.0000 mg | Freq: Four times a day (QID) | INTRAMUSCULAR | Status: DC | PRN

## 2016-06-28 MED ORDER — ACETAMINOPHEN 325 MG PO TABS
650.0000 mg | ORAL_TABLET | Freq: Four times a day (QID) | ORAL | Status: DC | PRN
  Administered 2016-06-28 – 2016-06-29 (×4): 650 mg via ORAL
  Filled 2016-06-28 (×4): qty 2

## 2016-06-28 MED ORDER — GABAPENTIN 100 MG PO CAPS
100.0000 mg | ORAL_CAPSULE | Freq: Three times a day (TID) | ORAL | Status: DC
  Administered 2016-06-28 – 2016-06-29 (×3): 100 mg via ORAL
  Filled 2016-06-28 (×3): qty 1

## 2016-06-28 MED ORDER — MUPIROCIN 2 % EX OINT
1.0000 "application " | TOPICAL_OINTMENT | Freq: Once | CUTANEOUS | Status: AC
  Administered 2016-06-28: 1 via TOPICAL

## 2016-06-28 MED ORDER — FENTANYL CITRATE (PF) 100 MCG/2ML IJ SOLN
INTRAMUSCULAR | Status: DC | PRN
  Administered 2016-06-28: 25 ug via INTRAVENOUS

## 2016-06-28 MED ORDER — IOPAMIDOL (ISOVUE-370) INJECTION 76%
INTRAVENOUS | Status: DC | PRN
  Administered 2016-06-28: 10 mL via INTRAVENOUS

## 2016-06-28 MED ORDER — GENTAMICIN SULFATE 40 MG/ML IJ SOLN
INTRAMUSCULAR | Status: AC
  Filled 2016-06-28: qty 2

## 2016-06-28 MED ORDER — CHLORHEXIDINE GLUCONATE 4 % EX LIQD
60.0000 mL | Freq: Once | CUTANEOUS | Status: DC
Start: 2016-06-28 — End: 2016-06-28
  Filled 2016-06-28: qty 60

## 2016-06-28 MED ORDER — LEVOTHYROXINE SODIUM 112 MCG PO TABS
112.0000 ug | ORAL_TABLET | Freq: Every day | ORAL | Status: DC
  Administered 2016-06-29: 112 ug via ORAL
  Filled 2016-06-28: qty 1

## 2016-06-28 MED ORDER — LOSARTAN POTASSIUM 50 MG PO TABS
100.0000 mg | ORAL_TABLET | Freq: Every day | ORAL | Status: DC
  Administered 2016-06-29: 100 mg via ORAL
  Filled 2016-06-28: qty 2

## 2016-06-28 SURGICAL SUPPLY — 7 items
CABLE SURGICAL S-101-97-12 (CABLE) ×2 IMPLANT
LEAD CAPSURE NOVUS 45CM (Lead) ×2 IMPLANT
LEAD CAPSURE NOVUS 5076-52CM (Lead) ×2 IMPLANT
PAD DEFIB LIFELINK (PAD) ×2 IMPLANT
PPM ADVISA MRI DR A2DR01 (Pacemaker) ×2 IMPLANT
SHEATH CLASSIC 7F (SHEATH) ×4 IMPLANT
TRAY PACEMAKER INSERTION (PACKS) ×2 IMPLANT

## 2016-06-28 NOTE — H&P (Signed)
Patient Care Team: [name], MD as PCP - General (Internal Medicine) [name], MD (Internal Medicine)   HPI  Caroline Nichols is a 74 y.o. female Admitted for pacemaker implantation for symptomatic sinus node dysfunction   EF normal  No interval change in symptoms    Past Medical History:  Diagnosis Date  . Allergy   . Anemia 02/2013   F/U with Dr. Grayland Ormond for Feraheme injections  . Anxiety   . Arthritis    Possible RA - Follow up appt with Dr. Jefm Bryant  . Cellulitis of arm, right   . Chronic kidney disease   . Diabetes insipidus ([place])    On desmopressin  . GERD (gastroesophageal reflux disease)   . H/O seasonal allergies   . H/O transfusion of packed red blood cells 02/2013   3 units PRBC for severe anemia 02/2013  . Herniated disc   . History of kidney stones   . Hyperlipidemia   . Hypertension   . Hyperthyroidism    s/p radiation therapy, now hypothyroidism  . IBS (irritable bowel syndrome)   . Migraine   . Pancreatic cyst   . Paroxysmal a-fib ([facility])   . Renal cell cancer, right ([place]) 10/23/2015   Right partial nephrectomy.    Past Surgical History:  Procedure Laterality Date  . ABDOMINAL HYSTERECTOMY    . BREAST EXCISIONAL BIOPSY Right 2005   neg  . BREAST SURGERY  1990s   Biopsy  . CERVICAL SPINE SURGERY     Bone fusion  . HAMMER TOE SURGERY    . HERNIA REPAIR Left    Inguinal Hernia Repair  . ROBOTIC ASSITED PARTIAL NEPHRECTOMY Right 10/23/2015   Procedure: ROBOTIC ASSITED PARTIAL NEPHRECTOMY with intraop ultrasound;  Surgeon: Hollice Espy, MD;  Location: [facility] ORS;  Service: Urology;  Laterality: Right;  . TUBAL LIGATION      No current facility-administered medications for this encounter.     Allergies  Allergen Reactions  . Adhesive [Tape] Other (See Comments)    "irritate skin"  . Codeine     REACTION: NAUSEA  . Morphine And Related Other (See Comments)    Shut down her organs  . Tetanus Toxoid     REACTION: ARM  SWELLING,REDNESS  . Tramadol Nausea And Vomiting      Social History  Substance Use Topics  . Smoking status: Former Smoker    Packs/day: 0.50    Years: 30.00    Types: Cigarettes    Quit date: 12/03/2012  . Smokeless tobacco: Never Used  . Alcohol use No     Family History  Problem Relation Age of Onset  . Heart disease Mother   . Heart disease Father   . Diabetes Brother   . Kidney disease Brother   . Stroke Daughter     due to medication reaction  . Breast cancer Cousin   . Prostate cancer Neg Hx   . Hematuria Neg Hx     No current facility-administered medications on file prior to encounter.    Current Outpatient Prescriptions on File Prior to Encounter  Medication Sig Dispense Refill  . acetaminophen (TYLENOL) 325 MG tablet Take 2 tablets (650 mg total) by mouth every 6 (six) hours as needed for pain.    Marland Kitchen ALPRAZolam (XANAX) 0.25 MG tablet Take 1 tablet (0.25 mg total) by mouth 2 (two) times daily as needed for anxiety. 60 tablet 2  . aspirin 81 MG tablet Take 81 mg by mouth  daily.    . brimonidine (ALPHAGAN) 0.2 % ophthalmic solution Place 1 drop into both eyes 2 (two) times daily.    . Calcium-Vitamin D (SUPER CALCIUM/D) 600-125 MG-UNIT TABS Take 2 tablets by mouth daily.     . Cholecalciferol (D3-1000 PO) Take 1,000 Units by mouth daily. Reported on 07/28/2015    . ferrous sulfate 325 (65 FE) MG tablet Take 325 mg by mouth daily with breakfast.    . fluticasone (FLONASE) 50 MCG/ACT nasal spray Place 2 sprays into both nostrils daily. (Patient taking differently: Place 2 sprays into both nostrils daily as needed for allergies. ) 16 g 6  . gabapentin (NEURONTIN) 100 MG capsule TAKE 1 CAPSULE (100 MG TOTAL) BY MOUTH 3 (THREE) TIMES DAILY. 90 capsule 2  . hydrALAZINE (APRESOLINE) 10 MG tablet TAKE 1 TABLET (10 MG TOTAL) BY MOUTH 2 (TWO) TIMES DAILY. 180 tablet 3  . hydroxychloroquine (PLAQUENIL) 200 MG tablet Take 400 mg by mouth daily.     Marland Kitchen latanoprost (XALATAN) 0.005 %  ophthalmic solution Place 1 drop into both eyes at bedtime.     Marland Kitchen levothyroxine (SYNTHROID, LEVOTHROID) 112 MCG tablet TAKE 1 TABLET  BY MOUTH DAILY BEFORE BREAKFAST. 90 tablet 1  . losartan (COZAAR) 100 MG tablet Take 1 tablet (100 mg total) by mouth daily. NOTE DOSE CHANGE TO 100 MG DAILY 90 tablet 1  . mirabegron ER (MYRBETRIQ) 50 MG TB24 tablet Take 1 tablet (50 mg total) by mouth daily. (Patient taking differently: Take 50 mg by mouth every evening. ) 30 tablet 2  . mirtazapine (REMERON) 7.5 MG tablet TAKE 1 TABLET (7.5 MG TOTAL) BY MOUTH AT BEDTIME. 30 tablet 3  . nitroGLYCERIN (NITROLINGUAL) 0.4 MG/SPRAY spray Place 1 spray under the tongue every 5 (five) minutes x 3 doses as needed for chest pain. 4.9 g 0  . Omega-3 Fatty Acids (FISH OIL) 1000 MG CAPS Take 1,000 mg by mouth daily.     . pantoprazole (PROTONIX) 40 MG tablet TAKE 1 TABLET (40 MG TOTAL) BY MOUTH DAILY. 30 tablet 4  . vitamin C (ASCORBIC ACID) 500 MG tablet Take 500 mg by mouth daily.       Review of Systems negative except from HPI and PMH  Physical Exam BP (!) 150/68   Pulse 85   Temp 98.3 F (36.8 C) (Oral)   Resp 18   Ht '5\' 2"'$  (1.575 m)   Wt 139 lb (63 kg)   SpO2 95%   BMI 25.42 kg/m  Well developed and well nourished in no acute distress HENT normal E scleral and icterus clear Neck Supple JVP flat; carotids brisk and full Clear to ausculation  Regular rate and rhythm, no murmurs gallops or rub Soft with active bowel sounds No clubbing cyanosis Edema Alert and oriented, grossly normal motor and sensory function Skin Warm and Dry    Assessment and  Plan  Assessment and Plan:  Sinus node dysfunction-symptomatic  Nocturnal pausing suggestive of sleep apnea  Nonsustained atrial tachycardia/fibrillation duration less than 1 minute  Sadness   For dual chamber pacemaker for symptomatic sinus node dysfunction   The benefits and risks were reviewed including but not limited to death,   perforation, infection, lead dislodgement and device malfunction.  The patient understands agrees and is willing to proceed.

## 2016-06-28 NOTE — Discharge Instructions (Signed)
° ° °  Supplemental Discharge Instructions for  Pacemaker/Defibrillator Patients  Activity No heavy lifting or vigorous activity with your left/right arm for 6 to 8 weeks.  Do not raise your left/right arm above your head for one week.  Gradually raise your affected arm as drawn below.             07/02/16                  07/03/16                  07/04/16                 07/05/16 __  NO DRIVING for 1 week    ; you may begin driving on  31/2/81   .  WOUND CARE - Keep the wound area clean and dry.  Do not get this area wet, no showers for 24 hours; you may shower on 06/29/16 evening  . - The tape/steri-strips on your wound will fall off; do not pull them off.  No bandage is needed on the site.  DO  NOT apply any creams, oils, or ointments to the wound area. - If you notice any drainage or discharge from the wound, any swelling or bruising at the site, or you develop a fever > 101? F after you are discharged home, call the office at once.  Special Instructions - You are still able to use cellular telephones; use the ear opposite the side where you have your pacemaker/defibrillator.  Avoid carrying your cellular phone near your device. - When traveling through airports, show security personnel your identification card to avoid being screened in the metal detectors.  Ask the security personnel to use the hand wand. - Avoid arc welding equipment, MRI testing (magnetic resonance imaging), TENS units (transcutaneous nerve stimulators).  Call the office for questions about other devices. - Avoid electrical appliances that are in poor condition or are not properly grounded. - Microwave ovens are safe to be near or to operate.  Additional information for defibrillator patients should your device go off: - If your device goes off ONCE and you feel fine afterward, notify the device clinic nurses. - If your device goes off ONCE and you do not feel well afterward, call 911. - If your device goes off  TWICE, call 911. - If your device goes off THREE times in one day, call 911.  DO NOT DRIVE YOURSELF OR A FAMILY MEMBER WITH A DEFIBRILLATOR TO THE HOSPITAL--CALL 911.

## 2016-06-28 NOTE — Progress Notes (Signed)
Orthopedic Tech Progress Note Patient Details:  Caroline Nichols 1940/09/03 [id number]  Ortho Devices Type of Ortho Device: Arm sling   [name] 06/28/2016, 5:01 PM

## 2016-06-29 ENCOUNTER — Encounter (HOSPITAL_COMMUNITY): Payer: Self-pay | Admitting: Physician Assistant

## 2016-06-29 ENCOUNTER — Ambulatory Visit (HOSPITAL_COMMUNITY): Payer: PPO

## 2016-06-29 DIAGNOSIS — I495 Sick sinus syndrome: Secondary | ICD-10-CM | POA: Diagnosis not present

## 2016-06-29 DIAGNOSIS — Z950 Presence of cardiac pacemaker: Secondary | ICD-10-CM | POA: Diagnosis present

## 2016-06-29 MED ORDER — LABETALOL HCL 200 MG PO TABS
200.0000 mg | ORAL_TABLET | Freq: Two times a day (BID) | ORAL | Status: DC
  Administered 2016-06-29: 200 mg via ORAL
  Filled 2016-06-29: qty 1

## 2016-06-29 NOTE — Progress Notes (Signed)
Patient alert and oriented in bed, MD rounded and saw bleeding under dressing and applies pressure, CXR  Obtaining , pending Discharge today

## 2016-06-29 NOTE — Discharge Summary (Signed)
Discharge Summary    Patient ID: Caroline Nichols,  MRN: [id number], DOB/AGE: [birth year] 75 y.o.  Admit date: 06/28/2016 Discharge date: 06/29/2016  Primary Care Provider: [name] Primary Cardiologist: Dr. Yvone Neu / Dr. Caryl Comes (EP)   Discharge Diagnoses    Principal Problem:   Sinus node dysfunction [facility]) Active Problems:   Essential hypertension   Diabetes insipidus ([place])   Depression   Hyperlipidemia   Renal cell carcinoma of right kidney ([place])   Pacemaker   Allergies Allergies  Allergen Reactions   Adhesive [Tape] Other (See Comments)    "irritate skin"   Codeine     REACTION: NAUSEA   Morphine And Related Other (See Comments)    Shut down her organs   Tetanus Toxoid     REACTION: ARM SWELLING,REDNESS   Tramadol Nausea And Vomiting     History of Present Illness    Caroline Nichols is a 75 y.o. female with a history of RCC s/p R partial nephrectomy, HTN, HLD, GERD, diabetes insipidus, CKD, and recently diagnosed sinus node dysfunction who presented to [facility] on 06/28/16 for planned PPM implantation.   She has a history of progressive exercise intolerance accompanied by dyspnea. She has not had peripheral edema or chest pain. She dates her symptoms back to March 2017 when she came in for renal procedure. Evaluation of the telemetry at that time demonstrated sinus rhythm with variable rates. There is also nonsustained atrial tachycardia/fibrillation episodes lasting less than a minute or so.   More recently she's continued to struggle with fatigue. In this regard is notable that she goes to sleep at about 7:30 at night. Dr. Yvone Neu saw her in the office and ordered an event recorder. This showed significant nocturnal pausing, daytime sinus bradycardia as well as sinus arrest and junctional rhythm with rates into the 30s and 40s. She was seen by Dr. Caryl Comes in the office who felt permanent pacemaker placement was indicated which was scheduled for  06/28/16.  Hospital Course     Consultants: none   Sinus node dysfunction: s/p successful implantation of a Medtronic CapSureFix Novus MRI SureScan serial # [id number] on 06/28/16 by Dr. Caryl Comes. CXR stable this AM and device interrogation normal. Nocturnal pausing suggestive of sleep apnea, outpatient sleep study should be considered.    The patient has had an uncomplicated hospital course and is recovering well. She has been seen by Dr. Caryl Comes today and deemed ready for discharge home. All follow-up appointments have been scheduled. Discharge medications are listed below.  _____________  Discharge Vitals Blood pressure (!) 195/82, pulse 74, temperature 97.3 F (36.3 C), temperature source Oral, resp. rate 20, height '5\' 2"'$  (1.575 m), weight 137 lb (62.1 kg), SpO2 97 %.  Filed Weights   06/28/16 0904 06/28/16 1639 06/29/16 0508  Weight: 139 lb (63 kg) 144 lb 2.9 oz (65.4 kg) 137 lb (62.1 kg)    Labs & Radiologic Studies     CBC No results for input(s): WBC, NEUTROABS, HGB, HCT, MCV, PLT in the last 72 hours. Basic Metabolic Panel No results for input(s): NA, K, CL, CO2, GLUCOSE, BUN, CREATININE, CALCIUM, MG, PHOS in the last 72 hours. Liver Function Tests No results for input(s): AST, ALT, ALKPHOS, BILITOT, PROT, ALBUMIN in the last 72 hours. No results for input(s): LIPASE, AMYLASE in the last 72 hours. Cardiac Enzymes No results for input(s): CKTOTAL, CKMB, CKMBINDEX, TROPONINI in the last 72 hours. BNP Invalid input(s): POCBNP D-Dimer No results for input(s): DDIMER in  the last 72 hours. Hemoglobin A1C No results for input(s): HGBA1C in the last 72 hours. Fasting Lipid Panel No results for input(s): CHOL, HDL, LDLCALC, TRIG, CHOLHDL, LDLDIRECT in the last 72 hours. Thyroid Function Tests No results for input(s): TSH, T4TOTAL, T3FREE, THYROIDAB in the last 72 hours.  Invalid input(s): FREET3  Ct Abdomen Pelvis W Wo Contrast  Result Date: 06/04/2016 CLINICAL DATA:   Partial right nephrectomy 10/23/2015 for renal cell carcinoma. Asymptomatic. Partial hysterectomy. EXAM: CT ABDOMEN AND PELVIS WITHOUT AND WITH CONTRAST TECHNIQUE: Multidetector CT imaging of the abdomen and pelvis was performed following the standard protocol before and following the bolus administration of intravenous contrast. CONTRAST:  100 cc of Isovue 370 COMPARISON:  09/27/2015 MRI.  CT of 06/16/2015. FINDINGS: Lower chest: Clear lung bases. Mild cardiomegaly. Multivessel coronary artery atherosclerosis. Hepatobiliary: Normal liver. Normal gallbladder, without biliary ductal dilatation. Pancreas: Normal, without mass or ductal dilatation. Spleen: Normal in size, without focal abnormality. Adrenals/Urinary Tract: Normal adrenal glands. No renal calculi or hydronephrosis. upper pole right renal cyst measures 2.3 cm. Interval lower pole right partial nephrectomy. No residual or locally recurrent disease. No acute postoperative complication. Normal urinary bladder. Stomach/Bowel: Normal stomach, without wall thickening. Scattered colonic diverticula. Normal terminal ileum. Normal small bowel. Vascular/Lymphatic: Advanced aortic and branch vessel atherosclerosis. Patent renal veins. No abdominopelvic adenopathy. Reproductive: Hysterectomy. Prominent gonadal veins. No adnexal mass. Other: No significant free fluid. Mild pelvic floor laxity. Left inguinal hernia repair. Musculoskeletal: Lumbosacral spondylosis. IMPRESSION: 1. Status post partial right nephrectomy, without recurrent or metastatic disease. 2.  Coronary artery atherosclerosis. Aortic atherosclerosis. 3. Pelvic floor laxity. 4. Prominent gonadal veins, as can be seen with pelvic congestion syndrome. Electronically Signed   By: Abigail Miyamoto M.D.   On: 06/04/2016 13:53   Chest 1 View  Result Date: 06/07/2016 CLINICAL DATA:  Hypertension.  History of renal cell carcinoma EXAM: CHEST 1 VIEW COMPARISON:  November 04, 2015 FINDINGS: Lungs are clear. Heart is  upper normal in size with pulmonary vascularity within normal limits. No adenopathy. No bone lesions. There is atherosclerotic calcification in the aorta. IMPRESSION: Aortic atherosclerosis.  No parenchymal lung lesion.  No adenopathy. Electronically Signed   By: Lowella Grip III M.D.   On: 06/07/2016 11:55   Dg Chest 2 View  Result Date: 06/29/2016 CLINICAL DATA:  Status post pacemaker insertion EXAM: CHEST  2 VIEW COMPARISON:  12/28/2012 FINDINGS: Cardiac shadow is enlarged. A pacing device is now seen in satisfactory position. No pneumothorax is noted. No sizable effusion is seen. No infiltrative changes are noted. No bony abnormality is seen. IMPRESSION: No evidence of pneumothorax following pacemaker placement. Electronically Signed   By: Inez Catalina M.D.   On: 06/29/2016 09:30     Diagnostic Studies/Procedures    Procedures 07/24/16  Pacemaker Implant  Implants     Lead  Lead Capsure Novus 5076-45cm - [id number] - Implanted              As of July 24, 2016         Lead Capsure Novus 5076-52cm - [id number] - Implanted    Inventory item: Lead Capsure Novus 5076-52cm Model/Cat number: [id number] CM  Serial number: [id number] Manufacturer: [employer]  Device identifier: [id number] Device identifier type: GS1  GUDID Information   Request status Successful    Brand name: CapSureFix Novus MRI SureScan Version/Model: 4332-95  Company name: [employer] MRI safety info as of July 24, 2016: MR Conditional  Contains dry or latex rubber: No    GMDN P.T. name: Endocardial  pacing lead    As of 06/28/2016   Status: Implanted      Pacemaker  Ppm Advisa Mri Dr Berlinda Last - [id number] - Implanted    Inventory item: Idelle Leech Mri Dr Berlinda Last Model/Cat number: [id number]  Serial number: [id number] Manufacturer: [employer]  Device identifier: [id number] Device identifier type: GS1  GUDID Information   Request status Successful    Brand name: Advisa  DR MRI SureScan Version/Model: [place] name: [employer] MRI safety info as of 06/28/16: MR Conditional  Contains dry or latex rubber: No    GMDN P.T. name: Dual-chamber implantable pacemaker, rate-responsive        _____________    Disposition   Pt is being discharged home today in good condition.  Follow-up Plans & Appointments    Follow-up Information    [facility] Follow up on 07/09/2016.   Specialty:  Cardiology Why:  8:15AM, wound check Contact information: [address] [place], [place] Follow up.   Specialty:  Cardiology Why:  The office will call you to arrange a follow up appointment Contact information: [place] [address] [phone]            Discharge Medications     Medication List    TAKE these medications   acetaminophen 325 MG tablet Commonly known as:  TYLENOL Take 2 tablets (650 mg total) by mouth every 6 (six) hours as needed for pain.   ALPRAZolam 0.25 MG tablet Commonly known as:  XANAX Take 1 tablet (0.25 mg total) by mouth 2 (two) times daily as needed for anxiety.   aspirin 81 MG tablet Take 81 mg by mouth daily.   brimonidine 0.2 % ophthalmic solution Commonly known as:  ALPHAGAN Place 1 drop into both eyes 2 (two) times daily.   D3-1000 PO Take 1,000 Units by mouth daily. Reported on 07/28/2015   ferrous sulfate 325 (65 FE) MG tablet Take 325 mg by mouth daily with breakfast.   Fish Oil 1000 MG Caps Take 1,000 mg by mouth daily.   fluticasone 50 MCG/ACT nasal spray Commonly known as:  FLONASE Place 2 sprays into both nostrils daily. What changed:  when to take this  reasons to take this   gabapentin 100 MG capsule Commonly known as:  NEURONTIN TAKE 1 CAPSULE (100 MG TOTAL) BY MOUTH 3 (THREE) TIMES DAILY.   gabapentin 300 MG capsule Commonly known as:   NEURONTIN TAKE 1 CAPSULE (300 MG TOTAL) BY MOUTH 3 (THREE) TIMES DAILY. AS NEEDED FOR SHINGLES PAIN   hydroxychloroquine 200 MG tablet Commonly known as:  PLAQUENIL Take 400 mg by mouth daily.   latanoprost 0.005 % ophthalmic solution Commonly known as:  XALATAN Place 1 drop into both eyes at bedtime.   levothyroxine 112 MCG tablet Commonly known as:  SYNTHROID, LEVOTHROID TAKE 1 TABLET  BY MOUTH DAILY BEFORE BREAKFAST.   losartan 100 MG tablet Commonly known as:  COZAAR Take 1 tablet (100 mg total) by mouth daily. NOTE DOSE CHANGE TO 100 MG DAILY   mirabegron ER 50 MG Tb24 tablet Commonly known as:  MYRBETRIQ Take 1 tablet (50 mg total) by mouth daily. What changed:  when to take this   mirtazapine 7.5 MG tablet Commonly known as:  REMERON TAKE 1 TABLET (7.5 MG TOTAL) BY MOUTH AT BEDTIME.   nitroGLYCERIN 0.4 MG/SPRAY spray Commonly known as:  [place]  1 spray under the tongue every 5 (five) minutes x 3 doses as needed for chest pain.   pantoprazole 40 MG tablet Commonly known as:  PROTONIX TAKE 1 TABLET (40 MG TOTAL) BY MOUTH DAILY.   SUPER CALCIUM/D 600-125 MG-UNIT Tabs Generic drug:  Calcium-Vitamin D Take 2 tablets by mouth daily.   vitamin C 500 MG tablet Commonly known as:  ASCORBIC ACID Take 500 mg by mouth daily.        Outstanding Labs/Studies   Wound check   Duration of Discharge Encounter   Greater than 30 minutes including physician time.  Signed, Angelena Form PA-C 06/29/2016, 11:21 AM

## 2016-06-29 NOTE — Progress Notes (Signed)
Patient Name: Caroline Nichols      SUBJECTIVE: without pain or sob  Past Medical History:  Diagnosis Date   Anemia 02/2013   F/U with Dr. Grayland Ormond for Feraheme injections   Anxiety    Arthritis    Possible RA - Follow up appt with Dr. Jefm Bryant   Cellulitis of arm, right    Chronic kidney disease    Diabetes insipidus ([place])    On desmopressin   GERD (gastroesophageal reflux disease)    H/O seasonal allergies    H/O transfusion of packed red blood cells 02/2013   3 units PRBC for severe anemia 02/2013   Herniated disc    History of kidney stones    Hyperlipidemia    Hypertension    Hyperthyroidism    s/p radiation therapy, now hypothyroidism   IBS (irritable bowel syndrome)    Migraine    Pacemaker    a. s/p successful implantation of a Medtronic CapSureFix Novus MRI SureScan serial # [id number] on 06/28/16 by Dr. Caryl Comes for sinus node dysfunction.   Pancreatic cyst    Paroxysmal a-fib ([facility])    Renal cell cancer, right ([place]) 10/23/2015   Right partial nephrectomy.    Scheduled Meds:  Scheduled Meds:  aspirin  81 mg Oral Daily   brimonidine  1 drop Both Eyes BID   calcium-vitamin D  2 tablet Oral Daily   Chlorhexidine Gluconate Cloth  6 each Topical Daily   ferrous sulfate  325 mg Oral Q breakfast   gabapentin  100 mg Oral TID   gabapentin  300 mg Oral TID   hydroxychloroquine  400 mg Oral Daily   labetalol  200 mg Oral BID   latanoprost  1 drop Both Eyes QHS   levothyroxine  112 mcg Oral QAC breakfast   losartan  100 mg Oral Daily   mirabegron ER  50 mg Oral QPM   mirtazapine  7.5 mg Oral QHS   mupirocin ointment  1 application Nasal BID   omega-3 acid ethyl esters  1,000 mg Oral Daily   pantoprazole  40 mg Oral Daily   vitamin C  500 mg Oral Daily   Continuous Infusions: acetaminophen, ALPRAZolam, nitroGLYCERIN, ondansetron (ZOFRAN) IV    PHYSICAL EXAM Vitals:   06/29/16 0508 06/29/16 0600 06/29/16 0737  06/29/16 1000  BP: (!) 199/79 (!) 160/78 (!) 187/76 (!) 195/82  Pulse: 75 78 74   Resp: 20     Temp: 97.3 F (36.3 C)     TempSrc: Oral     SpO2: 97%     Weight: 137 lb (62.1 kg)     Height:       Well developed and nourished in no acute distress HENT normal Neck supple with JVP-flat Clear Pocket with hematoma, swelling or tenderness  Regular rate and rhythm, no murmurs or gallops Abd-soft with active BS No Clubbing cyanosis edema Skin-warm and dry A & Oriented  Grossly normal sensory and motor function   TELEMETRY: Reviewed personnally pt in sinus:  ECG personally reviewed* sinus   Intake/Output Summary (Last 24 hours) at 06/29/16 1128 Last data filed at 06/29/16 1044  Gross per 24 hour  Intake           668.33 ml  Output             1000 ml  Net          -331.67 ml    LABS:   Telemetry Personally reviewed  Sinus  Device Interrogation normal device function  ASSESSMENT AND PLAN:  Principal Problem:   Sinus node dysfunction ([facility]) Active Problems:   Essential hypertension   Diabetes insipidus ([place])   Depression   Hyperlipidemia   Renal cell carcinoma of right kidney ([place])   Pacemaker  Post pace maker implantation instructions given Pocket hematoma pressure held 10 minute and pressure dressing applied Patient will need to be seen in the [facility] [place] office on Tuesday morning Labetalol added for hypertension; her Cozaar was resumed    Signed, Virl Axe MD  06/29/2016\

## 2016-07-01 ENCOUNTER — Encounter (HOSPITAL_COMMUNITY): Payer: Self-pay | Admitting: Internal Medicine

## 2016-07-02 ENCOUNTER — Telehealth: Payer: Self-pay | Admitting: *Deleted

## 2016-07-02 ENCOUNTER — Ambulatory Visit (INDEPENDENT_AMBULATORY_CARE_PROVIDER_SITE_OTHER): Payer: 59 | Admitting: *Deleted

## 2016-07-02 DIAGNOSIS — Z950 Presence of cardiac pacemaker: Secondary | ICD-10-CM

## 2016-07-02 NOTE — Progress Notes (Signed)
Patient added-on for pressure dressing removal.  Patient has not yet resumed ASA.  Sling and pressure dressing removed.  Hematoma remains, extending from PPM site to L axillary region.  Firm to palpation.  Dr. Caryl Comes saw patient, recommended reapplication of pressure dressing and ROV with [facility] as scheduled on 07/09/16.  Patient and daughter agreeable to this plan.  Pressure dressing reapplied, extra Tensoplast given to patient in case dressing loosens.  Patient educated about mobility restrictions.  Carelink monitor education provided.  Patient aware to call the [facility] with any signs/symptoms of bleeding or infection.

## 2016-07-02 NOTE — Telephone Encounter (Signed)
Noted that patient's PPM wound check is scheduled for 12/5 and appointment with Dr. Yvone Neu is scheduled for 12/7.  Patient interested in rescheduling appointments to the same day--wound check remains on 12/5 but appt with Dr. Yvone Neu now 12/5 at 10:15am.  Patient appreciative of call and denies additional questions or concerns at this time.

## 2016-07-09 ENCOUNTER — Ambulatory Visit (INDEPENDENT_AMBULATORY_CARE_PROVIDER_SITE_OTHER): Payer: Medicare Other | Admitting: Cardiology

## 2016-07-09 ENCOUNTER — Encounter: Payer: Self-pay | Admitting: Cardiology

## 2016-07-09 ENCOUNTER — Ambulatory Visit (INDEPENDENT_AMBULATORY_CARE_PROVIDER_SITE_OTHER): Payer: Medicare Other | Admitting: *Deleted

## 2016-07-09 VITALS — BP 150/84 | HR 71 | Ht 62.0 in | Wt 138.0 lb

## 2016-07-09 DIAGNOSIS — Z950 Presence of cardiac pacemaker: Secondary | ICD-10-CM

## 2016-07-09 DIAGNOSIS — I10 Essential (primary) hypertension: Secondary | ICD-10-CM

## 2016-07-09 DIAGNOSIS — I495 Sick sinus syndrome: Secondary | ICD-10-CM

## 2016-07-09 LAB — CUP PACEART INCLINIC DEVICE CHECK
Brady Statistic AP VP Percent: 0.4 %
Brady Statistic AP VS Percent: 15.84 %
Brady Statistic AS VS Percent: 83.7 %
Brady Statistic RV Percent Paced: 0.46 %
Date Time Interrogation Session: 20171205144919
Implantable Lead Implant Date: 20171124
Implantable Lead Location: 753859
Implantable Lead Location: 753860
Implantable Lead Model: 5076
Lead Channel Impedance Value: 380 Ohm
Lead Channel Pacing Threshold Amplitude: 0.75 V
Lead Channel Pacing Threshold Pulse Width: 0.4 ms
Lead Channel Sensing Intrinsic Amplitude: 12.25 mV
Lead Channel Setting Pacing Amplitude: 3.5 V
Lead Channel Setting Pacing Pulse Width: 0.4 ms
Lead Channel Setting Sensing Sensitivity: 0.9 mV
MDC IDC LEAD IMPLANT DT: 20171124
MDC IDC MSMT BATTERY VOLTAGE: 3.13 V
MDC IDC MSMT LEADCHNL RA IMPEDANCE VALUE: 323 Ohm
MDC IDC MSMT LEADCHNL RA IMPEDANCE VALUE: 399 Ohm
MDC IDC MSMT LEADCHNL RA PACING THRESHOLD PULSEWIDTH: 0.4 ms
MDC IDC MSMT LEADCHNL RV IMPEDANCE VALUE: 456 Ohm
MDC IDC MSMT LEADCHNL RV PACING THRESHOLD AMPLITUDE: 1 V
MDC IDC PG IMPLANT DT: 20171124
MDC IDC SET LEADCHNL RA PACING AMPLITUDE: 3.5 V
MDC IDC STAT BRADY AS VP PERCENT: 0.06 %
MDC IDC STAT BRADY RA PERCENT PACED: 16.23 %

## 2016-07-09 NOTE — Progress Notes (Signed)
Wound check appointment. Pressure dressing removed, Dermabond remains in place. Hematoma remains over PPM site, now smaller and softer to palpation. SK saw patient. Per SK, pressure dressing to remain off at this time, wound recheck scheduled for 07/16/16 to reassess. Patient and daughter aware to call in interim with any questions or signs/symptoms of infection. Normal device function. Thresholds, sensing, and impedances consistent with implant measurements. Device programmed at 3.5V with auto capture programmed on for extra safety margin until 3 month visit. Histogram distribution appropriate for patient and level of activity. No mode switches or high ventricular rates noted. Patient educated about wound care, arm mobility, lifting restrictions. ROV with [employer] on 07/16/16 and ROV with SK/B in 3 months.

## 2016-07-09 NOTE — Progress Notes (Signed)
Cardiology Office Note   Date:  07/09/2016   ID:  LAPARIS DURRETT, DOB 04-Feb-1941, MRN [id number]  Referring Doctor:  [name], MD   Cardiologist:   Wende Bushy, MD   Reason for consultation:  Chief Complaint  Patient presents with   other    41mof/u stress test/cardiac pacemaker. Pt c/o not sleeping well and has a cold. Reviewed meds with pt verbally.      History of Present Illness: Caroline KAISERis a 75y.o. female who presents for Follow-up  Patient was last seen by Dr. GRockey Situin Aug 2014.   Since last visit, patient has been evaluated by EP and has undergone pacemaker placement. They are closely monitoring her and she developed hematoma postop. Patient reports that overall, hematoma is improving. She does not have significant pain in the area. She cannot really tell right now she's not been really active but overall, she says she feels less tired  No chest pain. Shortness breath is stable as before. Continues to have poor sleeping habits. She has complaints of fatigue but she feels is related to poor sleeping habits.  No PND, orthopnea, edema palpitations. She does not complain of claudication.    ROS:  Please see the history of present illness. Aside from mentioned under HPI, all other systems are reviewed and negative.     Past Medical History:  Diagnosis Date   Anemia 02/2013   F/U with Dr. FGrayland Ormondfor Feraheme injections   Anxiety    Arthritis    Possible RA - Follow up appt with Dr. KJefm Bryant  Cellulitis of arm, right    Chronic kidney disease    Diabetes insipidus ([place]    On desmopressin   GERD (gastroesophageal reflux disease)    H/O seasonal allergies    H/O transfusion of packed red blood cells 02/2013   3 units PRBC for severe anemia 02/2013   Herniated disc    History of kidney stones    Hyperlipidemia    Hypertension    Hyperthyroidism    s/p radiation therapy, now hypothyroidism   IBS (irritable bowel syndrome)     Migraine    Pacemaker    a. s/p successful implantation of a Medtronic CapSureFix Novus MRI SureScan serial # [id number] on 06/28/16 by Dr. KCaryl Comesfor sinus node dysfunction.   Pancreatic cyst    Paroxysmal a-fib (HCC)    Renal cell cancer, right ([place] 10/23/2015   Right partial nephrectomy.    Past Surgical History:  Procedure Laterality Date   ABDOMINAL HYSTERECTOMY     BREAST EXCISIONAL BIOPSY Right 2005   neg   BREAST SURGERY  1990s   Biopsy   CERVICAL SPINE SURGERY     Bone fusion   EP IMPLANTABLE DEVICE N/A 06/28/2016   Procedure: Pacemaker Implant;  Surgeon: SDeboraha Sprang MD;  [facility];  Service: Cardiovascular;  Laterality: N/A;   HAMMER TOE SURGERY     HERNIA REPAIR Left    Inguinal Hernia Repair   ROBOTIC ASSITED PARTIAL NEPHRECTOMY Right 10/23/2015   Procedure: ROBOTIC ASSITED PARTIAL NEPHRECTOMY with intraop ultrasound;  Surgeon: AHollice Espy MD;  Location: [facility];  Service: Urology;  Laterality: Right;   TUBAL LIGATION       reports that she quit smoking about 3 years ago. Her smoking use included Cigarettes. She has a 15.00 pack-year smoking history. She has never used smokeless tobacco. She reports that she does not drink alcohol or use  drugs.   family history includes Breast cancer in her cousin; Diabetes in her brother; Heart disease in her father and mother; Kidney disease in her brother; Stroke in her daughter.   Outpatient Medications Prior to Visit  Medication Sig Dispense Refill   acetaminophen (TYLENOL) 325 MG tablet Take 2 tablets (650 mg total) by mouth every 6 (six) hours as needed for pain.     ALPRAZolam (XANAX) 0.25 MG tablet Take 1 tablet (0.25 mg total) by mouth 2 (two) times daily as needed for anxiety. 60 tablet 2   aspirin 81 MG tablet Take 81 mg by mouth daily.     brimonidine (ALPHAGAN) 0.2 % ophthalmic solution Place 1 drop into both eyes 2 (two) times daily.     Calcium-Vitamin D (SUPER CALCIUM/D)  600-125 MG-UNIT TABS Take 2 tablets by mouth daily.      Cholecalciferol (D3-1000 PO) Take 1,000 Units by mouth daily. Reported on 07/28/2015     ferrous sulfate 325 (65 FE) MG tablet Take 325 mg by mouth daily with breakfast.     fluticasone (FLONASE) 50 MCG/ACT nasal spray Place 2 sprays into both nostrils daily. (Patient taking differently: Place 2 sprays into both nostrils daily as needed for allergies. ) 16 g 6   gabapentin (NEURONTIN) 100 MG capsule TAKE 1 CAPSULE (100 MG TOTAL) BY MOUTH 3 (THREE) TIMES DAILY. 90 capsule 2   gabapentin (NEURONTIN) 300 MG capsule TAKE 1 CAPSULE (300 MG TOTAL) BY MOUTH 3 (THREE) TIMES DAILY. AS NEEDED FOR SHINGLES PAIN 90 capsule 3   hydroxychloroquine (PLAQUENIL) 200 MG tablet Take 400 mg by mouth daily.      latanoprost (XALATAN) 0.005 % ophthalmic solution Place 1 drop into both eyes at bedtime.      levothyroxine (SYNTHROID, LEVOTHROID) 112 MCG tablet TAKE 1 TABLET  BY MOUTH DAILY BEFORE BREAKFAST. 90 tablet 1   losartan (COZAAR) 100 MG tablet Take 1 tablet (100 mg total) by mouth daily. NOTE DOSE CHANGE TO 100 MG DAILY 90 tablet 1   mirabegron ER (MYRBETRIQ) 50 MG TB24 tablet Take 1 tablet (50 mg total) by mouth daily. (Patient taking differently: Take 50 mg by mouth every evening. ) 30 tablet 2   mirtazapine (REMERON) 7.5 MG tablet TAKE 1 TABLET (7.5 MG TOTAL) BY MOUTH AT BEDTIME. 30 tablet 3   nitroGLYCERIN (NITROLINGUAL) 0.4 MG/SPRAY spray Place 1 spray under the tongue every 5 (five) minutes x 3 doses as needed for chest pain. 4.9 g 0   Omega-3 Fatty Acids (FISH OIL) 1000 MG CAPS Take 1,000 mg by mouth daily.      pantoprazole (PROTONIX) 40 MG tablet TAKE 1 TABLET (40 MG TOTAL) BY MOUTH DAILY. 30 tablet 4   vitamin C (ASCORBIC ACID) 500 MG tablet Take 500 mg by mouth daily.     No facility-administered medications prior to visit.      Allergies: Adhesive [tape]; Codeine; Morphine and related; Tetanus toxoid; and Tramadol    PHYSICAL  EXAM: VS:  BP (!) 150/84 (BP Location: Right Arm, Patient Position: Sitting, Cuff Size: Normal)    Pulse 71    Ht '5\' 2"'$  (1.575 m)    Wt 138 lb (62.6 kg)    BMI 25.24 kg/m  , Body mass index is 25.24 kg/m. Wt Readings from Last 3 Encounters:  07/09/16 138 lb (62.6 kg)  06/29/16 137 lb (62.1 kg)  06/07/16 139 lb (63 kg)    GENERAL:  well developed, well nourished, not in acute distress HEENT: normocephalic, pink  conjunctivae, anicteric sclerae, no xanthelasma, normal dentition, oropharynx clear NECK:  no neck vein engorgement, JVP normal, no hepatojugular reflux, carotid upstroke brisk and symmetric, no bruit, no thyromegaly, no lymphadenopathy LUNGS:  good respiratory effort, clear to auscultation bilaterally CV:  PMI not displaced, no thrills, no lifts, S1 and S2 within normal limits, no palpable S3 or S4, no murmurs, no rubs, no gallopsNote of bruise around the pacemaker site extending into left upper arm ABD:  Soft, nontender, nondistended, normoactive bowel sounds, no abdominal aortic bruit, no hepatomegaly, no splenomegaly MS: nontender back, no kyphosis, no scoliosis, no joint deformities EXT:  2+ DP/PT pulses, no edema, no varicosities, no cyanosis, no clubbing SKIN: warm, nondiaphoretic, normal turgor, no ulcers NEUROPSYCH: alert, oriented to person, place, and time, sensory/motor grossly intact, normal mood, appropriate affect  Recent Labs: 07/11/2015: ALT 23; TSH 1.63 10/29/2015: Magnesium 2.2 06/21/2016: BUN 17; Creatinine, Ser 0.78; Hemoglobin 11.7; Platelets 118; Potassium 3.8; Sodium 138   Lipid Panel    Component Value Date/Time   CHOL 167 01/11/2015 1456   TRIG 72.0 01/11/2015 1456   HDL 54.50 01/11/2015 1456   CHOLHDL 3 01/11/2015 1456   VLDL 14.4 01/11/2015 1456   LDLCALC 98 01/11/2015 1456     Other studies Reviewed:  EKG:  The ekg from 03/06/2016 was personally reviewed by me and it revealed sinus rhythm, 66 BPM. LVH. QT 440 ms QTC 451 ms.  EKG from  10/28/2015 was personally reviewed by me and it revealed Narrow complex tachycardia, regular, ventricular rate of 159 BPM. Although the machine reading is of atrial fibrillation, there is a likelihood that this is sinus tachycardia. Possible LVH. Nonspecific ST-T wave changes.  EKG from 04/23/2016 was personally reviewed by me and showed the neck complex rhythm at 54 bpm, likely junctional rhythm.    Additional studies/ records that were reviewed personally reviewed by me today include:  Echo 10/30/2015: Procedure narrative: Transthoracic echocardiography. The study   was technically difficult. - Left ventricle: The cavity size was normal. There was mild   concentric hypertrophy. Systolic function was normal. The   estimated ejection fraction was in the range of 60% to 65%. Wall   motion was normal; there were no regional wall motion   abnormalities. The study is not technically sufficient to allow   evaluation of LV diastolic function. - Mitral valve: There was mild regurgitation. - Right ventricle: Systolic function was normal. - Pulmonary arteries: Systolic pressure was within the normal   range. - Pericardium, extracardiac: A trivial pericardial effusion was   identified.   CT chest 10/30/2015: IMPRESSION: 1. No pulmonary embolism. 2. Small right and trace left pleural effusions with mild to moderate bilateral lower lobe atelectasis. 3. Mild interlobular septal thickening in both lungs, suggesting mild pulmonary edema. 4. Coronary atherosclerosis.   Nuclear stress is 03/13/2016:  There was no ST segment deviation noted during stress.  No T wave inversion was noted during stress.  This is a low risk study.  Nuclear stress EF: 69%.  No evidence of ischemia on this study.  Event monitor 04/15/2016: Monitoring period was 03/11/2016 to 04/09/2016. Upon closer inspection of baseline sample, it appears to be possibly junctional rhythm. 60 BPM. It is labeled as atrial  fibrillation but RR intervals are regular, no appreciable P waves.  Heart rate ranged from 31 BPM at 03/18/2016 8:59 PM. Maximum heart rate 102 bpm at 03/26/2016 5:05 AM.  The manually detected events were as follows: 1 is labeled as passing out. Based on  phone conversation documentation,  patient did not truly pass out and she may have pressed the button by mistake. The rhythm was sinus with artifact.  fatigue : Sinus rhythm with PVC  Automatically detected events: Concerning for several episodes of sinus pauses. The pauses ranged from 2.2-4.2 seconds. These occurred at varying times:  03/15/2016, 12:09 AM  03/18/2016, 8:49 PM 03/19/2016 12:37 AM 03/22/2016 8:31 PM 03/28/2016 2:25AM 03/28/2016 10:36 PM 03/29/2016 12:28 AM 03/29/2016 1:23 AM 03/30/2016 12:49 AM  Episode of what appears to be junctional rhythm, sinus bradycardia with atrial runs. This was 04/08/2016 at 4:05 AM.   ASSESSMENT AND PLAN: Chest pain Shortness of breath CAD based on atherosclerosis noted on CT scan No evidence of ischemia from stress testing. Recommend aspirin enteric-coated 81 mg by mouth daily. To be resumed once okay from EP standpoint. Discussed LDL goal of less than 70 due to this diagnosis. Her LDL was 98 from 2016. May benefit eventually from statin therapy. Patient will like to discuss this with her PCP.  HTN Currently being managed by her PCP.   Sinus node dysfunction Diagnosed to have paroxysmal atrial fibrillation from recent admission in March 2017. Extensive review of EKGs and telemetry recordings did not give clear evidence of atrial fibrillation. There was note of narrow complex tachycardia likely paroxysmal atrial tachycardia. Evidence of sinus node dysfunction on cardiac monitor.  s/p successful implantation of a Medtronic CapSureFix Novus MRI SureScan serial # [id number] on 06/28/16 by Dr. Caryl Comes  L carotid bruit This was previously noted Carotid artery duplex ultrasound results  from 03/21/2016 were within normal limits.  Current medicines are reviewed at length with the patient today.  The patient does not have concerns regarding medicines.  Labs/ tests ordered today include:  Orders Placed This Encounter  Procedures   EKG 12-Lead    I had a lengthy and detailed discussion with the patient regarding diagnoses, prognosis, diagnostic options, treatment options , and side effects of medications.   I counseled the patient on importance of lifestyle modification including heart healthy diet, regular physical activity .   Disposition:   FU with undersigned In 6 months   Signed, Wende Bushy, MD  07/09/2016 11:26 AM    [place]  This note was generated in part with voice recognition software and I apologize for any typographical errors that were not detected and corrected.

## 2016-07-09 NOTE — Patient Instructions (Signed)
Follow-Up: Your physician wants you to follow-up in: 6 months with Dr. Yvone Neu. You will receive a reminder letter in the mail two months in advance. If you don't receive a letter, please call our office to schedule the follow-up appointment.  It was a pleasure seeing you today here in the office. Please do not hesitate to give [place] a call back if you have any further questions. [place], BSN

## 2016-07-11 ENCOUNTER — Ambulatory Visit: Payer: PPO | Admitting: Cardiology

## 2016-07-16 ENCOUNTER — Ambulatory Visit (INDEPENDENT_AMBULATORY_CARE_PROVIDER_SITE_OTHER): Payer: Medicare Other | Admitting: *Deleted

## 2016-07-16 DIAGNOSIS — Z950 Presence of cardiac pacemaker: Secondary | ICD-10-CM

## 2016-07-16 DIAGNOSIS — I495 Sick sinus syndrome: Secondary | ICD-10-CM

## 2016-07-16 NOTE — Progress Notes (Signed)
Wound recheck appointment.  Dermabond removed.  Incision edges well approximated and well healed.  Ecchymosis now yellowish in color.  Hematoma still present over device site but continues to dissipate, soft to palpation.  Patient denies fever/chills, drainage, or other signs/symptoms of infection.  ROV with [facility] on 07/30/16 for wound reassessment.  Patient and daughter agree to call with any signs/symptoms of infection in the interim.

## 2016-07-18 ENCOUNTER — Encounter: Payer: Self-pay | Admitting: Internal Medicine

## 2016-07-18 ENCOUNTER — Ambulatory Visit (INDEPENDENT_AMBULATORY_CARE_PROVIDER_SITE_OTHER): Payer: Medicare Other | Admitting: Internal Medicine

## 2016-07-18 DIAGNOSIS — E559 Vitamin D deficiency, unspecified: Secondary | ICD-10-CM | POA: Diagnosis not present

## 2016-07-18 DIAGNOSIS — Z0000 Encounter for general adult medical examination without abnormal findings: Secondary | ICD-10-CM

## 2016-07-18 DIAGNOSIS — D61818 Other pancytopenia: Secondary | ICD-10-CM | POA: Diagnosis not present

## 2016-07-18 DIAGNOSIS — C641 Malignant neoplasm of right kidney, except renal pelvis: Secondary | ICD-10-CM

## 2016-07-18 MED ORDER — ALPRAZOLAM 0.25 MG PO TABS: 0.2500 mg | ORAL_TABLET | Freq: Two times a day (BID) | ORAL | 2 refills | Status: DC | PRN

## 2016-07-18 MED ORDER — GABAPENTIN 300 MG PO CAPS: 300.0000 mg | ORAL_CAPSULE | Freq: Three times a day (TID) | ORAL | 3 refills | Status: DC

## 2016-07-18 MED ORDER — PANTOPRAZOLE SODIUM 40 MG PO TBEC: DELAYED_RELEASE_TABLET | ORAL | 3 refills | Status: DC

## 2016-07-18 NOTE — Progress Notes (Signed)
Pre-visit discussion using our clinic review tool. No additional management support is needed unless otherwise documented below in the visit note.  

## 2016-07-18 NOTE — Progress Notes (Signed)
Patient ID: Caroline Nichols, female    DOB: [birth year]  Age: 75 y.o. MRN: [id number]  The patient is here for annual Medicare wellness examination and management of other chronic and acute problems.(refused to have it with Denisa)   During the course of the visit , End of Life objectives were discussed at length,  Patient does have a living will in place and advance directives.  Along with a healthcare power of attorney.  Health care [place] and [place] her daughters     The risk factors are reflected in the social history.  The roster of all physicians providing medical care to patient - is listed in the Snapshot section of the chart.  Activities of daily living:  The patient is 100% independent in all ADLs: dressing, toileting, feeding as well as independent mobility. Using a 4 footed cane for rambulation    Home safety : The patient has smoke detectors in the home. They wear seatbelts.  There are no firearms at home. There is no violence in the home.   There is no risk for hepatitis, STDs or HIV. There is no   history of blood transfusion. They have no travel history to infectious disease endemic areas of the world.  The patient has seen their dentist in the last six month. They have seen their eye doctor in the last year. They admit to slight hearing difficulty with regard to whispered voices and some television programs.  They have deferred audiologic testing in the last year.  They do not  have excessive sun exposure. Discussed the need for sun protection: hats, long sleeves and use of sunscreen if there is significant sun exposure.   Diet: the importance of a healthy diet is discussed. They do have a healthy diet.  The benefits of regular aerobic exercise were discussed. She walks 4 times per week ,  20 minutes.   Depression screen: there are no signs or vegative symptoms of depression- irritability, change in appetite, anhedonia, sadness/tearfullness.  Cognitive  assessment: the patient manages all their financial and personal affairs and is actively engaged. They could relate day,date,year and events; recalled 2/3 objects at 3 minutes; performed clock-face test normally.  The following portions of the patient's history were reviewed and updated as appropriate: allergies, current medications, past family history, past medical history,  past surgical history, past social history  and problem list.  Visual acuity was not assessed per patient preference since she has regular follow up with her ophthalmologist. Hearing and body mass index were assessed and reviewed.   During the course of the visit the patient was educated and counseled about appropriate screening and preventive services including : fall prevention , diabetes screening, nutrition counseling, colorectal cancer screening, and recommended immunizations.    CC: Diagnoses of Encounter for Medicare annual wellness exam, Vitamin D deficiency, Acquired pancytopenia ([place]), and Renal cell carcinoma of right kidney ([place]) were pertinent to this visit.   Recent pacemaker insertion Jun 28 2016.  By Caryl Comes .comnplicated by a large hematoma over PM site. Wound checks report gradual resolution .  Post herpetic neuralgiaRecent episode of shingles pain , residual .  Episode occurred in April managed with gabapentin    Partial nephrectomy in March for renal cell CA repeat CT was done  late Oct  Atherosclerosis noted  mammogram normal oct 2017 Hysterectomy Colonoscopy  Done In [facility] Dr Lorna Dibble.  Over 10 years ago  Bone marrow 2017 normal karyotype  For leukopenia sees finnegan  in February   Going to reduce gabapentin to 300 mg daily for neuropathy    History [place] has a past medical history of Anemia (02/2013); Anxiety; Arthritis; Cellulitis of arm, right; Chronic kidney disease; Diabetes insipidus ([place]); GERD (gastroesophageal reflux disease); H/O seasonal allergies; H/O transfusion of packed  red blood cells (02/2013); Herniated disc; History of kidney stones; Hyperlipidemia; Hypertension; Hyperthyroidism; IBS (irritable bowel syndrome); Migraine; Pacemaker; Pancreatic cyst; Paroxysmal a-fib ([place]); and Renal cell cancer, right ([place]) (10/23/2015).   She has a past surgical history that includes Tubal ligation; Cervical spine surgery; Hammer toe surgery; Abdominal hysterectomy; Breast surgery (1990s); Hernia repair (Left); Robotic assited partial nephrectomy (Right, 10/23/2015); Breast excisional biopsy (Right, 2005); and Cardiac catheterization (N/A, 06/28/2016).   Her family history includes Breast cancer in her cousin; Diabetes in her brother; Heart disease in her father and mother; Kidney disease in her brother; Stroke in her daughter.She reports that she quit smoking about 3 years ago. Her smoking use included Cigarettes. She has a 15.00 pack-year smoking history. She has never used smokeless tobacco. She reports that she does not drink alcohol or use drugs.  Outpatient Medications Prior to Visit  Medication Sig Dispense Refill  . acetaminophen (TYLENOL) 325 MG tablet Take 2 tablets (650 mg total) by mouth every 6 (six) hours as needed for pain.    Marland Kitchen aspirin 81 MG tablet Take 81 mg by mouth daily.    . brimonidine (ALPHAGAN) 0.2 % ophthalmic solution Place 1 drop into both eyes 2 (two) times daily.    . Calcium-Vitamin D (SUPER CALCIUM/D) 600-125 MG-UNIT TABS Take 2 tablets by mouth daily.     . Cholecalciferol (D3-1000 PO) Take 1,000 Units by mouth daily. Reported on 07/28/2015    . ferrous sulfate 325 (65 FE) MG tablet Take 325 mg by mouth daily with breakfast.    . fluticasone (FLONASE) 50 MCG/ACT nasal spray Place 2 sprays into both nostrils daily. (Patient taking differently: Place 2 sprays into both nostrils daily as needed for allergies. ) 16 g 6  . hydroxychloroquine (PLAQUENIL) 200 MG tablet Take 400 mg by mouth daily.     Marland Kitchen latanoprost (XALATAN) 0.005 % ophthalmic solution  Place 1 drop into both eyes at bedtime.     Marland Kitchen levothyroxine (SYNTHROID, LEVOTHROID) 112 MCG tablet TAKE 1 TABLET  BY MOUTH DAILY BEFORE BREAKFAST. 90 tablet 1  . losartan (COZAAR) 100 MG tablet Take 1 tablet (100 mg total) by mouth daily. NOTE DOSE CHANGE TO 100 MG DAILY 90 tablet 1  . mirabegron ER (MYRBETRIQ) 50 MG TB24 tablet Take 1 tablet (50 mg total) by mouth daily. (Patient taking differently: Take 50 mg by mouth every evening. ) 30 tablet 2  . mirtazapine (REMERON) 7.5 MG tablet TAKE 1 TABLET (7.5 MG TOTAL) BY MOUTH AT BEDTIME. 30 tablet 3  . nitroGLYCERIN (NITROLINGUAL) 0.4 MG/SPRAY spray Place 1 spray under the tongue every 5 (five) minutes x 3 doses as needed for chest pain. 4.9 g 0  . Omega-3 Fatty Acids (FISH OIL) 1000 MG CAPS Take 1,000 mg by mouth daily.     . vitamin C (ASCORBIC ACID) 500 MG tablet Take 500 mg by mouth daily.    Marland Kitchen gabapentin (NEURONTIN) 100 MG capsule TAKE 1 CAPSULE (100 MG TOTAL) BY MOUTH 3 (THREE) TIMES DAILY. 90 capsule 2  . pantoprazole (PROTONIX) 40 MG tablet TAKE 1 TABLET (40 MG TOTAL) BY MOUTH DAILY. 30 tablet 4  . ALPRAZolam (XANAX) 0.25 MG tablet Take 1 tablet (0.25 mg  total) by mouth 2 (two) times daily as needed for anxiety. 60 tablet 2  . gabapentin (NEURONTIN) 300 MG capsule TAKE 1 CAPSULE (300 MG TOTAL) BY MOUTH 3 (THREE) TIMES DAILY. AS NEEDED FOR SHINGLES PAIN 90 capsule 3   No facility-administered medications prior to visit.     Review of Systems  Objective:  BP 132/72   Pulse 97   Temp 97.5 F (36.4 C) (Oral)   Resp 12   Ht 5' 2.75" (1.594 m)   Wt 138 lb (62.6 kg)   SpO2 97%   BMI 24.64 kg/m   Physical Exam    Assessment & Plan:   Problem List Items Addressed This Visit    Acquired pancytopenia ([place])    S/p bone marrow aspiration  by dr Grayland Ormond to rule out MDS.  Counts have been stable.       Encounter for Medicare annual wellness exam    Annual Medicare wellness  exam was done as well as a comprehensive physical exam and  management of acute and chronic conditions .  During the course of the visit the patient was educated and counseled about appropriate screening and preventive services including : fall prevention , diabetes screening, nutrition counseling, colorectal cancer screening, and recommended immunizations.  Printed recommendations for health maintenance screenings was given.       Renal cell carcinoma of right kidney (HCC)    No recurrence by follow up CT post partial right nephrectomy      Relevant Medications   ALPRAZolam (XANAX) 0.25 MG tablet   Vitamin D deficiency    Recurrent,  Advised to increase dose to 2000 IUs daily during winter months          I am having Ms. Char maintain her Fish Oil, Calcium-Vitamin D, acetaminophen, vitamin C, ferrous sulfate, Cholecalciferol (D3-1000 PO), brimonidine, fluticasone, mirabegron ER, hydroxychloroquine, latanoprost, losartan, mirtazapine, nitroGLYCERIN, aspirin, levothyroxine, gabapentin, ALPRAZolam, and pantoprazole.  Meds ordered this encounter  Medications  . gabapentin (NEURONTIN) 300 MG capsule    Sig: Take 1 capsule (300 mg total) by mouth 3 (three) times daily. As needed for shingles pain    Dispense:  90 capsule    Refill:  3  . ALPRAZolam (XANAX) 0.25 MG tablet    Sig: Take 1 tablet (0.25 mg total) by mouth 2 (two) times daily as needed for anxiety.    Dispense:  60 tablet    Refill:  2  . pantoprazole (PROTONIX) 40 MG tablet    Sig: TAKE 1 TABLET (40 MG TOTAL) BY MOUTH DAILY.    Dispense:  90 tablet    Refill:  3    CYCLE FILL MEDICATION. Authorization is required for next refill.    Medications Discontinued During This Encounter  Medication Reason  . gabapentin (NEURONTIN) 300 MG capsule Reorder  . ALPRAZolam (XANAX) 0.25 MG tablet Reorder  . pantoprazole (PROTONIX) 40 MG tablet Reorder  . gabapentin (NEURONTIN) 100 MG capsule     Follow-up: Return in about 6 months (around 01/16/2017).   [name], MD

## 2016-07-18 NOTE — Patient Instructions (Addendum)
Your vitamin D is always borderline low.  Please increase to 2000 IUs daily for the winter months. . Low Vitamin D can iinterfere with your body's ability to absorb the calcium in your diet and cuase your body to rob the bones of calcium  Continue your calcium supplement .  Soy and almond milk are great sources and contain no cholesterol and no dairy   Cologuard will be sent to your home for your colon cancer screening   Talk to your cardiologist about the pacemaker with regard to Do Not Resuscitate orders   Health Maintenance for Postmenopausal Women Introduction Menopause is a normal process in which your reproductive ability comes to an end. This process happens gradually over a span of months to years, usually between the ages of 59 and 42. Menopause is complete when you have missed 12 consecutive menstrual periods. It is important to talk with your health care provider about some of the most common conditions that affect postmenopausal women, such as heart disease, cancer, and bone loss (osteoporosis). Adopting a healthy lifestyle and getting preventive care can help to promote your health and wellness. Those actions can also lower your chances of developing some of these common conditions. What should I know about menopause? During menopause, you may experience a number of symptoms, such as:  Moderate-to-severe hot flashes.  Night sweats.  Decrease in sex drive.  Mood swings.  Headaches.  Tiredness.  Irritability.  Memory problems.  Insomnia. Choosing to treat or not to treat menopausal changes is an individual decision that you make with your health care provider. What should I know about hormone replacement therapy and supplements? Hormone therapy products are effective for treating symptoms that are associated with menopause, such as hot flashes and night sweats. Hormone replacement carries certain risks, especially as you become older. If you are thinking about using  estrogen or estrogen with progestin treatments, discuss the benefits and risks with your health care provider. What should I know about heart disease and stroke? Heart disease, heart attack, and stroke become more likely as you age. This may be due, in part, to the hormonal changes that your body experiences during menopause. These can affect how your body processes dietary fats, triglycerides, and cholesterol. Heart attack and stroke are both medical emergencies. There are many things that you can do to help prevent heart disease and stroke:  Have your blood pressure checked at least every 1-2 years. High blood pressure causes heart disease and increases the risk of stroke.  If you are 78-36 years old, ask your health care provider if you should take aspirin to prevent a heart attack or a stroke.  Do not use any tobacco products, including cigarettes, chewing tobacco, or electronic cigarettes. If you need help quitting, ask your health care provider.  It is important to eat a healthy diet and maintain a healthy weight.  Be sure to include plenty of vegetables, fruits, low-fat dairy products, and lean protein.  Avoid eating foods that are high in solid fats, added sugars, or salt (sodium).  Get regular exercise. This is one of the most important things that you can do for your health.  Try to exercise for at least 150 minutes each week. The type of exercise that you do should increase your heart rate and make you sweat. This is known as moderate-intensity exercise.  Try to do strengthening exercises at least twice each week. Do these in addition to the moderate-intensity exercise.  Know your numbers.Ask your health  care provider to check your cholesterol and your blood glucose. Continue to have your blood tested as directed by your health care provider. What should I know about cancer screening? There are several types of cancer. Take the following steps to reduce your risk and to catch any  cancer development as early as possible. Breast Cancer  Practice breast self-awareness.  This means understanding how your breasts normally appear and feel.  It also means doing regular breast self-exams. Let your health care provider know about any changes, no matter how small.  If you are 3 or older, have a clinician do a breast exam (clinical breast exam or CBE) every year. Depending on your age, family history, and medical history, it may be recommended that you also have a yearly breast X-ray (mammogram).  If you have a family history of breast cancer, talk with your health care provider about genetic screening.  If you are at high risk for breast cancer, talk with your health care provider about having an MRI and a mammogram every year.  Breast cancer (BRCA) gene test is recommended for women who have family members with BRCA-related cancers. Results of the assessment will determine the need for genetic counseling and BRCA1 and for BRCA2 testing. BRCA-related cancers include these types:  Breast. This occurs in males or females.  Ovarian.  Tubal. This may also be called fallopian tube cancer.  Cancer of the abdominal or pelvic lining (peritoneal cancer).  Prostate.  Pancreatic. Cervical, Uterine, and Ovarian Cancer  Your health care provider may recommend that you be screened regularly for cancer of the pelvic organs. These include your ovaries, uterus, and vagina. This screening involves a pelvic exam, which includes checking for microscopic changes to the surface of your cervix (Pap test).  For women ages 21-65, health care providers may recommend a pelvic exam and a Pap test every three years. For women ages 68-65, they may recommend the Pap test and pelvic exam, combined with testing for human papilloma virus (HPV), every five years. Some types of HPV increase your risk of cervical cancer. Testing for HPV may also be done on women of any age who have unclear Pap test  results.  Other health care providers may not recommend any screening for nonpregnant women who are considered low risk for pelvic cancer and have no symptoms. Ask your health care provider if a screening pelvic exam is right for you.  If you have had past treatment for cervical cancer or a condition that could lead to cancer, you need Pap tests and screening for cancer for at least 20 years after your treatment. If Pap tests have been discontinued for you, your risk factors (such as having a new sexual partner) need to be reassessed to determine if you should start having screenings again. Some women have medical problems that increase the chance of getting cervical cancer. In these cases, your health care provider may recommend that you have screening and Pap tests more often.  If you have a family history of uterine cancer or ovarian cancer, talk with your health care provider about genetic screening.  If you have vaginal bleeding after reaching menopause, tell your health care provider.  There are currently no reliable tests available to screen for ovarian cancer. Lung Cancer  Lung cancer screening is recommended for adults 70-18 years old who are at high risk for lung cancer because of a history of smoking. A yearly low-dose CT scan of the lungs is recommended if you:  Currently smoke.  Have a history of at least 30 pack-years of smoking and you currently smoke or have quit within the past 15 years. A pack-year is smoking an average of one pack of cigarettes per day for one year. Yearly screening should:  Continue until it has been 15 years since you quit.  Stop if you develop a health problem that would prevent you from having lung cancer treatment. Colorectal Cancer  This type of cancer can be detected and can often be prevented.  Routine colorectal cancer screening usually begins at age 39 and continues through age 33.  If you have risk factors for colon cancer, your health care  provider may recommend that you be screened at an earlier age.  If you have a family history of colorectal cancer, talk with your health care provider about genetic screening.  Your health care provider may also recommend using home test kits to check for hidden blood in your stool.  A small camera at the end of a tube can be used to examine your colon directly (sigmoidoscopy or colonoscopy). This is done to check for the earliest forms of colorectal cancer.  Direct examination of the colon should be repeated every 5-10 years until age 27. However, if early forms of precancerous polyps or small growths are found or if you have a family history or genetic risk for colorectal cancer, you may need to be screened more often. Skin Cancer  Check your skin from head to toe regularly.  Monitor any moles. Be sure to tell your health care provider:  About any new moles or changes in moles, especially if there is a change in a mole's shape or color.  If you have a mole that is larger than the size of a pencil eraser.  If any of your family members has a history of skin cancer, especially at a young age, talk with your health care provider about genetic screening.  Always use sunscreen. Apply sunscreen liberally and repeatedly throughout the day.  Whenever you are outside, protect yourself by wearing long sleeves, pants, a wide-brimmed hat, and sunglasses. What should I know about osteoporosis? Osteoporosis is a condition in which bone destruction happens more quickly than new bone creation. After menopause, you may be at an increased risk for osteoporosis. To help prevent osteoporosis or the bone fractures that can happen because of osteoporosis, the following is recommended:  If you are 49-76 years old, get at least 1,000 mg of calcium and at least 600 mg of vitamin D per day.  If you are older than age 49 but younger than age 80, get at least 1,200 mg of calcium and at least 600 mg of vitamin D  per day.  If you are older than age 64, get at least 1,200 mg of calcium and at least 800 mg of vitamin D per day. Smoking and excessive alcohol intake increase the risk of osteoporosis. Eat foods that are rich in calcium and vitamin D, and do weight-bearing exercises several times each week as directed by your health care provider. What should I know about how menopause affects my mental health? Depression may occur at any age, but it is more common as you become older. Common symptoms of depression include:  Low or sad mood.  Changes in sleep patterns.  Changes in appetite or eating patterns.  Feeling an overall lack of motivation or enjoyment of activities that you previously enjoyed.  Frequent crying spells. Talk with your health care provider  if you think that you are experiencing depression. What should I know about immunizations? It is important that you get and maintain your immunizations. These include:  Tetanus, diphtheria, and pertussis (Tdap) booster vaccine.  Influenza every year before the flu season begins.  Pneumonia vaccine.  Shingles vaccine. Your health care provider may also recommend other immunizations. This information is not intended to replace advice given to you by your health care provider. Make sure you discuss any questions you have with your health care provider. Document Released: 09/13/2005 Document Revised: 02/09/2016 Document Reviewed: 04/25/2015  2017 Elsevier

## 2016-07-19 NOTE — Progress Notes (Signed)
cologuard ordered.

## 2016-07-21 DIAGNOSIS — E559 Vitamin D deficiency, unspecified: Secondary | ICD-10-CM | POA: Insufficient documentation

## 2016-07-21 DIAGNOSIS — Z0000 Encounter for general adult medical examination without abnormal findings: Secondary | ICD-10-CM | POA: Insufficient documentation

## 2016-07-21 NOTE — Assessment & Plan Note (Signed)

## 2016-07-21 NOTE — Assessment & Plan Note (Signed)
No recurrence by follow up CT post partial right nephrectomy

## 2016-07-21 NOTE — Assessment & Plan Note (Signed)
Recurrent,  Advised to increase dose to 2000 IUs daily during winter months

## 2016-07-21 NOTE — Assessment & Plan Note (Addendum)
S/p bone marrow aspiration  by dr Grayland Ormond to rule out MDS.  Counts have been stable.

## 2016-07-23 ENCOUNTER — Telehealth: Payer: Self-pay | Admitting: Cardiology

## 2016-07-23 NOTE — Telephone Encounter (Signed)
lmov to talk with patient about mcr supp policy.  Spoke with annie at [employer].  Per Deneise Lever the supp will not cover anything that traditional medicare doesn't cover.  Patient does not have traditional medicare and may not be aware that the supplement she has doesn't cover anything medical or rx.  Wanted to touch base with patient to discuss conversation with [employer] and see if she or family may be interested in contacting insurance about need to keep policy (paying for it) if they do not cover anything as patient has non traditional medicare.

## 2016-07-30 ENCOUNTER — Ambulatory Visit (INDEPENDENT_AMBULATORY_CARE_PROVIDER_SITE_OTHER): Payer: 59 | Admitting: *Deleted

## 2016-07-30 DIAGNOSIS — Z950 Presence of cardiac pacemaker: Secondary | ICD-10-CM

## 2016-07-30 NOTE — Progress Notes (Signed)
Wound recheck appointment.  Incision well healed.  Ecchymosis almost fully resolved, slight yellowish ecchymosis remains towards left axilla.  Slight swelling remains over device site, diffuse edges, soft and nontender to palpation.  Site with less swelling than at previous check on 07/16/16.  Patient denies fever/chills, drainage, or other signs/symptosm of infection.  Per patient's preference, ROV with [place] [facility] on 08/20/16 at 8:30am for wound reassessment.  Patient agrees to call with any signs/symptoms of infection or bleeding in the interim.

## 2016-08-06 DIAGNOSIS — Z1211 Encounter for screening for malignant neoplasm of colon: Secondary | ICD-10-CM | POA: Diagnosis not present

## 2016-08-06 DIAGNOSIS — Z1212 Encounter for screening for malignant neoplasm of rectum: Secondary | ICD-10-CM | POA: Diagnosis not present

## 2016-08-06 LAB — COLOGUARD: Cologuard: POSITIVE

## 2016-08-12 ENCOUNTER — Telehealth: Payer: Self-pay

## 2016-08-12 DIAGNOSIS — Z1211 Encounter for screening for malignant neoplasm of colon: Secondary | ICD-10-CM

## 2016-08-12 NOTE — Telephone Encounter (Signed)
The results of patient's cologuard is  Positive. This may indicate a polyp somewhere in the colon.  I  would like to refer him to GI for further evaluation .    Is hre willing to see on and does he have a preference?

## 2016-08-12 NOTE — Telephone Encounter (Signed)
Her Cologuard results are positive. Please advise

## 2016-08-13 ENCOUNTER — Encounter: Payer: Self-pay | Admitting: Internal Medicine

## 2016-08-13 NOTE — Telephone Encounter (Signed)
Pt called back returning your call. Thank you! °

## 2016-08-13 NOTE — Telephone Encounter (Signed)
[facility] on Home # [phone] for patient to call back for lab results

## 2016-08-14 NOTE — Telephone Encounter (Signed)
Patient notified of her cologuard and will go to any GI referral you choose.

## 2016-08-14 NOTE — Telephone Encounter (Signed)
REFERRAL to jeff bynett in progress

## 2016-08-14 NOTE — Addendum Note (Signed)
Addended by: [name] on: 08/14/2016 05:27 PM   Modules accepted: Orders

## 2016-08-15 ENCOUNTER — Encounter: Payer: Self-pay | Admitting: *Deleted

## 2016-08-15 ENCOUNTER — Telehealth: Payer: Self-pay | Admitting: General Surgery

## 2016-08-15 NOTE — Telephone Encounter (Signed)
08-15-16 I HAVE LEFT MESSAGES ON H&C#'S FOR PT TO CALL BACK AND SCHEDULE AN APPOINTMENT WITH DR BYRNETT REF DR Derrel Nip FOR SCREENING COLONOSCOPY(? ANY PRIOR)/MTH

## 2016-08-18 ENCOUNTER — Other Ambulatory Visit: Payer: Self-pay | Admitting: Internal Medicine

## 2016-08-20 ENCOUNTER — Ambulatory Visit (INDEPENDENT_AMBULATORY_CARE_PROVIDER_SITE_OTHER): Payer: Medicare Other | Admitting: *Deleted

## 2016-08-20 DIAGNOSIS — Z950 Presence of cardiac pacemaker: Secondary | ICD-10-CM

## 2016-08-20 NOTE — Progress Notes (Signed)
Wound recheck appointment.  Incision well healed.  Faint brownish-purple ecchymosis remains towards left axilla.  Mild edema over device site, diffuse edges, soft and nontender to palpation.  Patient reports new ecchymosis around nipple/areola on left breast (see below for photo, patient gave verbal consent to include in note).  Patient denies fever/chills, drainage, or other signs/symptoms of infection.  Spoke with Dr. Caryl Comes, who recommended reapplication of pressure dressing and f/u in one week.  Pressure dressing reapplied, patient is aware to keep clean, dry, and intact until next appointment.  ROV with Dr. Caryl Comes in [place] on 08/27/16 at 9:00am.

## 2016-08-23 ENCOUNTER — Telehealth: Payer: Self-pay | Admitting: Urology

## 2016-08-23 ENCOUNTER — Other Ambulatory Visit: Payer: Self-pay | Admitting: Internal Medicine

## 2016-08-23 NOTE — Telephone Encounter (Signed)
Patient wants to know if she can get more samples of myrbetriq?  Please call her   Caroline Nichols

## 2016-08-23 NOTE — Telephone Encounter (Signed)
Samples were provided for pt.

## 2016-08-27 ENCOUNTER — Ambulatory Visit (INDEPENDENT_AMBULATORY_CARE_PROVIDER_SITE_OTHER): Payer: Medicare Other | Admitting: Internal Medicine

## 2016-08-27 ENCOUNTER — Telehealth: Payer: Self-pay | Admitting: *Deleted

## 2016-08-27 ENCOUNTER — Encounter: Payer: Self-pay | Admitting: Internal Medicine

## 2016-08-27 VITALS — BP 164/84 | HR 70 | Ht 62.0 in | Wt 138.2 lb

## 2016-08-27 DIAGNOSIS — I495 Sick sinus syndrome: Secondary | ICD-10-CM

## 2016-08-27 DIAGNOSIS — Z950 Presence of cardiac pacemaker: Secondary | ICD-10-CM

## 2016-08-27 NOTE — Progress Notes (Signed)
Patient Care Team: [name], MD as PCP - General (Internal Medicine) [name], MD (Internal Medicine)   HPI  Caroline Nichols is a 76 y.o. female Seen in followup for hematoma     Past Medical History:  Diagnosis Date   Anemia 02/2013   F/U with Dr. Grayland Ormond for Feraheme injections   Anxiety    Arthritis    Possible RA - Follow up appt with Dr. Jefm Bryant   Cellulitis of arm, right    Chronic kidney disease    Diabetes insipidus ([place])    On desmopressin   GERD (gastroesophageal reflux disease)    H/O seasonal allergies    H/O transfusion of packed red blood cells 02/2013   3 units PRBC for severe anemia 02/2013   Herniated disc    History of kidney stones    Hyperlipidemia    Hypertension    Hyperthyroidism    s/p radiation therapy, now hypothyroidism   IBS (irritable bowel syndrome)    Migraine    Pacemaker    a. s/p successful implantation of a Medtronic CapSureFix Novus MRI SureScan serial # [id number] on 06/28/16 by Dr. Caryl Comes for sinus node dysfunction.   Pancreatic cyst    Paroxysmal a-fib (HCC)    Renal cell cancer, right ([place]) 10/23/2015   Right partial nephrectomy.    Past Surgical History:  Procedure Laterality Date   ABDOMINAL HYSTERECTOMY     BREAST EXCISIONAL BIOPSY Right 2005   neg   BREAST SURGERY  1990s   Biopsy   CERVICAL SPINE SURGERY     Bone fusion   EP IMPLANTABLE DEVICE N/A 06/28/2016   Procedure: Pacemaker Implant;  Surgeon: Deboraha Sprang, MD;  Location: [facility];  Service: Cardiovascular;  Laterality: N/A;   HAMMER TOE SURGERY     HERNIA REPAIR Left    Inguinal Hernia Repair   ROBOTIC ASSITED PARTIAL NEPHRECTOMY Right 10/23/2015   Procedure: ROBOTIC ASSITED PARTIAL NEPHRECTOMY with intraop ultrasound;  Surgeon: Hollice Espy, MD;  Location: [facility];  Service: Urology;  Laterality: Right;   TUBAL LIGATION      Current Outpatient Prescriptions  Medication Sig Dispense Refill    acetaminophen (TYLENOL) 325 MG tablet Take 2 tablets (650 mg total) by mouth every 6 (six) hours as needed for pain.     ALPRAZolam (XANAX) 0.25 MG tablet Take 1 tablet (0.25 mg total) by mouth 2 (two) times daily as needed for anxiety. 60 tablet 2   brimonidine (ALPHAGAN) 0.2 % ophthalmic solution Place 1 drop into both eyes 2 (two) times daily.     Calcium-Vitamin D (SUPER CALCIUM/D) 600-125 MG-UNIT TABS Take 2 tablets by mouth daily.      Cholecalciferol (D3-1000 PO) Take 1,000 Units by mouth daily. Reported on 07/28/2015     ferrous sulfate 325 (65 FE) MG tablet Take 325 mg by mouth daily with breakfast.     fluticasone (FLONASE) 50 MCG/ACT nasal spray Place 2 sprays into both nostrils daily. (Patient taking differently: Place 2 sprays into both nostrils daily as needed for allergies. ) 16 g 6   gabapentin (NEURONTIN) 300 MG capsule Take 1 capsule (300 mg total) by mouth 3 (three) times daily. As needed for shingles pain 90 capsule 3   hydroxychloroquine (PLAQUENIL) 200 MG tablet Take 400 mg by mouth daily.      latanoprost (XALATAN) 0.005 % ophthalmic solution Place 1 drop into both eyes at bedtime.      levothyroxine (SYNTHROID,  LEVOTHROID) 112 MCG tablet TAKE 1 TABLET  BY MOUTH DAILY BEFORE BREAKFAST. 90 tablet 1   losartan (COZAAR) 100 MG tablet TAKE ONE TABLET BY MOUTH DAILY. NOTE DOSE CHANGED TO '100MG'$ !! 90 tablet 0   mirabegron ER (MYRBETRIQ) 50 MG TB24 tablet Take 1 tablet (50 mg total) by mouth daily. (Patient taking differently: Take 50 mg by mouth every evening. ) 30 tablet 2   mirtazapine (REMERON) 7.5 MG tablet TAKE 1 TABLET (7.5 MG TOTAL) BY MOUTH AT BEDTIME. 30 tablet 5   nitroGLYCERIN (NITROLINGUAL) 0.4 MG/SPRAY spray Place 1 spray under the tongue every 5 (five) minutes x 3 doses as needed for chest pain. 4.9 g 0   Omega-3 Fatty Acids (FISH OIL) 1000 MG CAPS Take 1,000 mg by mouth daily.      pantoprazole (PROTONIX) 40 MG tablet TAKE 1 TABLET (40 MG TOTAL) BY MOUTH  DAILY. 90 tablet 3   vitamin C (ASCORBIC ACID) 500 MG tablet Take 500 mg by mouth daily.     No current facility-administered medications for this visit.     Allergies  Allergen Reactions   Adhesive [Tape] Other (See Comments)    "irritate skin"   Codeine     REACTION: NAUSEA   Morphine And Related Other (See Comments)    Shut down her organs   Tetanus Toxoid     REACTION: ARM SWELLING,REDNESS   Tramadol Nausea And Vomiting      Review of Systems negative except from HPI and PMH  Physical Exam BP (!) 164/84 (BP Location: Left Arm, Patient Position: Sitting, Cuff Size: Normal)    Pulse 70    Ht '5\' 2"'$  (1.575 m)    Wt 138 lb 4 oz (62.7 kg)    BMI 25.29 kg/m   Some ecchymosis at inferior L breast     Assessment and  Plan   Will rechech in 2 weeks     Current medicines are reviewed at length with the patient today .  The patient does not have concerns regarding medicines.

## 2016-08-27 NOTE — Telephone Encounter (Signed)
Spoke with patient about rescheduling 09/10/16 appointment so that she can be seen by Dr. Caryl Comes instead of the [facility].  Patient is agreeable to moving appointment time to 9:00am rather than 8:30am and is aware that she will be seen by Dr. Caryl Comes.  She is appreciative of call and denies additional questions or concerns at this time.

## 2016-08-27 NOTE — Patient Instructions (Addendum)
Medication Instructions: - Your physician recommends that you continue on your current medications as directed. Please refer to the Current Medication list given to you today.  Labwork: - none ordered  Procedures/Testing: - none ordered  Follow-Up: - Your physician recommends that you schedule a follow-up appointment in: 2 weeks with Dr. Caryl Comes- (wound check).  Any Additional Special Instructions Will Be Listed Below (If Applicable).     If you need a refill on your cardiac medications before your next appointment, please call your pharmacy.

## 2016-08-28 ENCOUNTER — Encounter: Payer: Self-pay | Admitting: *Deleted

## 2016-08-28 DIAGNOSIS — H47233 Glaucomatous optic atrophy, bilateral: Secondary | ICD-10-CM | POA: Diagnosis not present

## 2016-08-28 DIAGNOSIS — H52223 Regular astigmatism, bilateral: Secondary | ICD-10-CM | POA: Diagnosis not present

## 2016-08-28 DIAGNOSIS — H5203 Hypermetropia, bilateral: Secondary | ICD-10-CM | POA: Diagnosis not present

## 2016-08-28 DIAGNOSIS — M069 Rheumatoid arthritis, unspecified: Secondary | ICD-10-CM | POA: Diagnosis not present

## 2016-08-28 DIAGNOSIS — H2513 Age-related nuclear cataract, bilateral: Secondary | ICD-10-CM | POA: Diagnosis not present

## 2016-08-28 DIAGNOSIS — I10 Essential (primary) hypertension: Secondary | ICD-10-CM | POA: Diagnosis not present

## 2016-08-28 DIAGNOSIS — Z79899 Other long term (current) drug therapy: Secondary | ICD-10-CM | POA: Diagnosis not present

## 2016-08-28 DIAGNOSIS — H401132 Primary open-angle glaucoma, bilateral, moderate stage: Secondary | ICD-10-CM | POA: Diagnosis not present

## 2016-08-28 DIAGNOSIS — H1850 Unspecified hereditary corneal dystrophies: Secondary | ICD-10-CM | POA: Diagnosis not present

## 2016-09-03 ENCOUNTER — Ambulatory Visit: Payer: Medicare Other | Admitting: General Surgery

## 2016-09-04 ENCOUNTER — Other Ambulatory Visit: Payer: Self-pay | Admitting: Internal Medicine

## 2016-09-10 ENCOUNTER — Ambulatory Visit (INDEPENDENT_AMBULATORY_CARE_PROVIDER_SITE_OTHER): Payer: Medicare Other | Admitting: General Surgery

## 2016-09-10 ENCOUNTER — Encounter: Payer: Self-pay | Admitting: General Surgery

## 2016-09-10 ENCOUNTER — Ambulatory Visit (INDEPENDENT_AMBULATORY_CARE_PROVIDER_SITE_OTHER): Payer: Medicare Other | Admitting: Internal Medicine

## 2016-09-10 ENCOUNTER — Encounter: Payer: Self-pay | Admitting: Internal Medicine

## 2016-09-10 ENCOUNTER — Ambulatory Visit: Payer: 59

## 2016-09-10 VITALS — BP 112/66 | HR 62 | Resp 12 | Ht 62.0 in | Wt 138.0 lb

## 2016-09-10 VITALS — BP 132/78 | HR 74 | Ht 62.0 in | Wt 138.0 lb

## 2016-09-10 DIAGNOSIS — I495 Sick sinus syndrome: Secondary | ICD-10-CM | POA: Diagnosis not present

## 2016-09-10 DIAGNOSIS — R1900 Intra-abdominal and pelvic swelling, mass and lump, unspecified site: Secondary | ICD-10-CM

## 2016-09-10 DIAGNOSIS — Z1211 Encounter for screening for malignant neoplasm of colon: Secondary | ICD-10-CM

## 2016-09-10 DIAGNOSIS — T148XXA Other injury of unspecified body region, initial encounter: Secondary | ICD-10-CM

## 2016-09-10 DIAGNOSIS — Z950 Presence of cardiac pacemaker: Secondary | ICD-10-CM | POA: Diagnosis not present

## 2016-09-10 MED ORDER — POLYETHYLENE GLYCOL 3350 17 GM/SCOOP PO POWD: 1.0000 | Freq: Once | ORAL | 0 refills | Status: AC

## 2016-09-10 NOTE — Telephone Encounter (Signed)
error 

## 2016-09-10 NOTE — Progress Notes (Addendum)
Patient ID: Caroline Nichols, female   DOB: [birth year], 76 y.o.   MRN: [id number]  Chief Complaint  Patient presents with   Colonoscopy    HPI Caroline Nichols is a 76 y.o. female here today for an evaluation for a colonoscopy. Patient had a Cologuard test completed on 08/06/16 which was positive. Moves bowels regularly, denies blood in stool. Her last one was done in [place], she thinks it was done in 2008. She states she does have some heartburn and gas. She is originally from [facility].  She lives in [facility], a independent living facility in [place].  HPI  Past Medical History:  Diagnosis Date   Anemia 02/2013   F/U with Dr. Grayland Ormond for Feraheme injections   Anxiety    Arthritis    Possible RA - Follow up appt with Dr. Jefm Bryant   Cellulitis of arm, right    Chronic kidney disease    Diabetes insipidus ([place])    On desmopressin   GERD (gastroesophageal reflux disease)    H/O seasonal allergies    H/O transfusion of packed red blood cells 02/2013   3 units PRBC for severe anemia 02/2013   Herniated disc    History of kidney stones    Hyperlipidemia    Hypertension    Hyperthyroidism    s/p radiation therapy, now hypothyroidism   IBS (irritable bowel syndrome)    Migraine    Pacemaker    a. s/p successful implantation of a Medtronic CapSureFix Novus MRI SureScan serial # [id number] on 06/28/16 by Dr. Caryl Comes for sinus node dysfunction.   Pancreatic cyst    Paroxysmal a-fib ([facility])    Renal cell cancer, right ([place]) 10/23/2015   Right partial nephrectomy.    Past Surgical History:  Procedure Laterality Date   ABDOMINAL HYSTERECTOMY     BREAST EXCISIONAL BIOPSY Right 2005   neg   BREAST SURGERY  1990s   Biopsy   CERVICAL SPINE SURGERY     Bone fusion   COLONOSCOPY  2008?   [place]   EP IMPLANTABLE DEVICE N/A 06/28/2016   Procedure: Pacemaker Implant;  Surgeon: Deboraha Sprang, MD;  Location: [facility];  Service:  Cardiovascular;  Laterality: N/A;   HAMMER TOE SURGERY     HERNIA REPAIR Left    Inguinal Hernia Repair   ROBOTIC ASSITED PARTIAL NEPHRECTOMY Right 10/23/2015   Procedure: ROBOTIC ASSITED PARTIAL NEPHRECTOMY with intraop ultrasound;  Surgeon: Hollice Espy, MD;  Location: [facility];  Service: Urology;  Laterality: Right;   TUBAL LIGATION      Family History  Problem Relation Age of Onset   Heart disease Mother    Heart disease Father    Diabetes Brother    Kidney disease Brother    Stroke Daughter     due to medication reaction   Breast cancer Cousin    Prostate cancer Neg Hx    Hematuria Neg Hx     Social History Social History  Substance Use Topics   Smoking status: Former Smoker    Packs/day: 0.50    Years: 30.00    Types: Cigarettes    Quit date: 12/03/2012   Smokeless tobacco: Never Used   Alcohol use No    Allergies  Allergen Reactions   Adhesive [Tape] Other (See Comments)    "irritate skin"   Codeine     REACTION: NAUSEA   Morphine And Related Other (See Comments)    Shut down her organs   Tetanus  Toxoid     REACTION: ARM SWELLING,REDNESS   Tramadol Nausea And Vomiting    Current Outpatient Prescriptions  Medication Sig Dispense Refill   acetaminophen (TYLENOL) 325 MG tablet Take 2 tablets (650 mg total) by mouth every 6 (six) hours as needed for pain.     ALPRAZolam (XANAX) 0.25 MG tablet Take 1 tablet (0.25 mg total) by mouth 2 (two) times daily as needed for anxiety. 60 tablet 2   brimonidine (ALPHAGAN) 0.2 % ophthalmic solution Place 1 drop into both eyes 2 (two) times daily.     Calcium-Vitamin D (SUPER CALCIUM/D) 600-125 MG-UNIT TABS Take 2 tablets by mouth daily.      Cholecalciferol (D3-1000 PO) Take 1,000 Units by mouth daily. Reported on 07/28/2015     ferrous sulfate 325 (65 FE) MG tablet Take 325 mg by mouth daily with breakfast.     fluticasone (FLONASE) 50 MCG/ACT nasal spray Place 2 sprays into both nostrils daily.  (Patient taking differently: Place 2 sprays into both nostrils daily as needed for allergies. ) 16 g 6   gabapentin (NEURONTIN) 300 MG capsule Take 1 capsule (300 mg total) by mouth 3 (three) times daily. As needed for shingles pain 90 capsule 3   hydroxychloroquine (PLAQUENIL) 200 MG tablet Take 400 mg by mouth daily.      latanoprost (XALATAN) 0.005 % ophthalmic solution Place 1 drop into both eyes at bedtime.      levothyroxine (SYNTHROID, LEVOTHROID) 112 MCG tablet TAKE 1 TABLET  BY MOUTH DAILY BEFORE BREAKFAST. 90 tablet 1   losartan (COZAAR) 100 MG tablet TAKE ONE TABLET BY MOUTH DAILY. NOTE DOSE CHANGED TO '100MG'$ !! 90 tablet 0   mirabegron ER (MYRBETRIQ) 50 MG TB24 tablet Take 1 tablet (50 mg total) by mouth daily. (Patient taking differently: Take 50 mg by mouth every evening. ) 30 tablet 2   mirtazapine (REMERON) 7.5 MG tablet TAKE 1 TABLET (7.5 MG TOTAL) BY MOUTH AT BEDTIME. 30 tablet 5   nitroGLYCERIN (NITROLINGUAL) 0.4 MG/SPRAY spray Place 1 spray under the tongue every 5 (five) minutes x 3 doses as needed for chest pain. 4.9 g 0   Omega-3 Fatty Acids (FISH OIL) 1000 MG CAPS Take 1,000 mg by mouth daily.      pantoprazole (PROTONIX) 40 MG tablet TAKE 1 TABLET (40 MG TOTAL) BY MOUTH DAILY. 90 tablet 3   vitamin C (ASCORBIC ACID) 500 MG tablet Take 500 mg by mouth daily.     No current facility-administered medications for this visit.     Review of Systems Review of Systems  Constitutional: Negative.   Respiratory: Negative.   Cardiovascular: Negative.   Gastrointestinal: Negative.     Blood pressure 112/66, pulse 62, resp. rate 12, height '5\' 2"'$  (1.575 m), weight 138 lb (62.6 kg).  Physical Exam Physical Exam  Constitutional: She is oriented to person, place, and time. She appears well-developed and well-nourished.  Eyes: Conjunctivae are normal. No scleral icterus.  Cardiovascular: Normal rate, regular rhythm and normal heart sounds.   Pulmonary/Chest: Effort  normal and breath sounds normal.  Abdominal: Soft. Bowel sounds are normal.  Neurological: She is alert and oriented to person, place, and time.  Skin: Skin is warm and dry.    Data Reviewed Cologuard test dated 08/06/2016 was positive.  Assessment    Candidate for colonoscopy due to abnormal Colguard test.    Plan      Colonoscopy with possible biopsy/polypectomy prn: Information regarding the procedure, including its potential risks and complications (including  but not limited to perforation of the bowel, which may require emergency surgery to repair, and bleeding) was verbally given to the patient. Educational information regarding lower intestinal endoscopy was given to the patient. Written instructions for how to complete the bowel prep using Miralax were provided. The importance of drinking ample fluids to avoid dehydration as a result of the prep emphasized.  The patient is scheduled for a Colonoscopy at [facility] on 10/15/16. They are aware to call the day before to get their arrival time. She will stop her fish oil one week prior. She will only take her blood pressure medication by 8 am the day of. Miralax prescription has been sent into the patient's pharmacy. The patient is aware of date and instructions.    This information has been scribed by Verlene Mayer, CMA          Caroline Nichols 09/18/2016, 4:25 PM

## 2016-09-10 NOTE — Patient Instructions (Addendum)
Medication Instructions: - Your physician recommends that you continue on your current medications as directed. Please refer to the Current Medication list given to you today.  Labwork: - none ordered  Procedures/Testing: - none ordered  Follow-Up: - Remote monitoring is used to monitor your Pacemaker of ICD from home. This monitoring reduces the number of office visits required to check your device to one time per year. It allows [place] to keep an eye on the functioning of your device to ensure it is working properly. You are scheduled for a device check from home on 12/10/16. You may send your transmission at any time that day. If you have a wireless device, the transmission will be sent automatically. After your physician reviews your transmission, you will receive a postcard with your next transmission date.  - Your physician wants you to follow-up in: 9 months with Dr. Caryl Comes. You will receive a reminder letter in the mail two months in advance. If you don't receive a letter, please call our office to schedule the follow-up appointment.  Any Additional Special Instructions Will Be Listed Below (If Applicable).     If you need a refill on your cardiac medications before your next appointment, please call your pharmacy.

## 2016-09-10 NOTE — Patient Instructions (Addendum)
Colonoscopy, Adult A colonoscopy is an exam to look at the entire large intestine. During the exam, a lubricated, bendable tube is inserted into the anus and then passed into the rectum, colon, and other parts of the large intestine. A colonoscopy is often done as a part of normal colorectal screening or in response to certain symptoms, such as anemia, persistent diarrhea, abdominal pain, and blood in the stool. The exam can help screen for and diagnose medical problems, including:  Tumors.  Polyps.  Inflammation.  Areas of bleeding. Tell a health care provider about:  Any allergies you have.  All medicines you are taking, including vitamins, herbs, eye drops, creams, and over-the-counter medicines.  Any problems you or family members have had with anesthetic medicines.  Any blood disorders you have.  Any surgeries you have had.  Any medical conditions you have.  Any problems you have had passing stool. What are the risks? Generally, this is a safe procedure. However, problems may occur, including:  Bleeding.  A tear in the intestine.  A reaction to medicines given during the exam.  Infection (rare). What happens before the procedure? Eating and drinking restrictions  Follow instructions from your health care provider about eating and drinking, which may include:  A few days before the procedure - follow a low-fiber diet. Avoid nuts, seeds, dried fruit, raw fruits, and vegetables.  1-3 days before the procedure - follow a clear liquid diet. Drink only clear liquids, such as clear broth or bouillon, black coffee or tea, clear juice, clear soft drinks or sports drinks, gelatin desert, and popsicles. Avoid any liquids that contain red or purple dye.  On the day of the procedure - do not eat or drink anything during the 2 hours before the procedure, or within the time period that your health care provider recommends. Bowel prep  If you were prescribed an oral bowel prep to  clean out your colon:  Take it as told by your health care provider. Starting the day before your procedure, you will need to drink a large amount of medicated liquid. The liquid will cause you to have multiple loose stools until your stool is almost clear or light green.  If your skin or anus gets irritated from diarrhea, you may use these to relieve the irritation:  Medicated wipes, such as adult wet wipes with aloe and vitamin E.  A skin soothing-product like petroleum jelly.  If you vomit while drinking the bowel prep, take a break for up to 60 minutes and then begin the bowel prep again. If vomiting continues and you cannot take the bowel prep without vomiting, call your health care provider. General instructions  Ask your health care provider about changing or stopping your regular medicines. This is especially important if you are taking diabetes medicines or blood thinners.  Plan to have someone take you home from the hospital or clinic. What happens during the procedure?  An IV tube may be inserted into one of your veins.  You will be given medicine to help you relax (sedative).  To reduce your risk of infection:  Your health care team will wash or sanitize their hands.  Your anal area will be washed with soap.  You will be asked to lie on your side with your knees bent.  Your health care provider will lubricate a long, thin, flexible tube. The tube will have a camera and a light on the end.  The tube will be inserted into your anus.  The tube will be gently eased through your rectum and colon.  Air will be delivered into your colon to keep it open. You may feel some pressure or cramping.  The camera will be used to take images during the procedure.  A small tissue sample may be removed from your body to be examined under a microscope (biopsy). If any potential problems are found, the tissue will be sent to a lab for testing.  If small polyps are found, your health  care provider may remove them and have them checked for cancer cells.  The tube that was inserted into your anus will be slowly removed. The procedure may vary among health care providers and hospitals. What happens after the procedure?  Your blood pressure, heart rate, breathing rate, and blood oxygen level will be monitored until the medicines you were given have worn off.  Do not drive for 24 hours after the exam.  You may have a small amount of blood in your stool.  You may pass gas and have mild abdominal cramping or bloating due to the air that was used to inflate your colon during the exam.  It is up to you to get the results of your procedure. Ask your health care provider, or the department performing the procedure, when your results will be ready. This information is not intended to replace advice given to you by your health care provider. Make sure you discuss any questions you have with your health care provider. Document Released: 07/19/2000 Document Revised: 02/09/2016 Document Reviewed: 10/03/2015 Elsevier Interactive Patient Education  2017 [employer].   The patient is scheduled for a Colonoscopy at [facility] on 10/15/16. They are aware to call the day before to get their arrival time. She will stop her fish oil one week prior. She will only take her blood pressure medication by 8 am the day of. Miralax prescription has been sent into the patient's pharmacy. The patient is aware of date and instructions.

## 2016-09-10 NOTE — Progress Notes (Signed)
Patient Care Team: [name], MD as PCP - General (Internal Medicine) [name], MD (Internal Medicine) Robert Bellow, MD (General Surgery)   HPI  Caroline Nichols is a 76 y.o. female Seen in followup for pacer implanted for symptomatic bradycardia  We have been following her closely for hematoma  She is better with less dyspnea  Records and Results Reviewed   Past Medical History:  Diagnosis Date   Anemia 02/2013   F/U with Dr. Grayland Ormond for Feraheme injections   Anxiety    Arthritis    Possible RA - Follow up appt with Dr. Jefm Bryant   Cellulitis of arm, right    Chronic kidney disease    Diabetes insipidus ([place])    On desmopressin   GERD (gastroesophageal reflux disease)    H/O seasonal allergies    H/O transfusion of packed red blood cells 02/2013   3 units PRBC for severe anemia 02/2013   Herniated disc    History of kidney stones    Hyperlipidemia    Hypertension    Hyperthyroidism    s/p radiation therapy, now hypothyroidism   IBS (irritable bowel syndrome)    Migraine    Pacemaker    a. s/p successful implantation of a Medtronic CapSureFix Novus MRI SureScan serial # [id number] on 06/28/16 by Dr. Caryl Comes for sinus node dysfunction.   Pancreatic cyst    Paroxysmal a-fib (HCC)    Renal cell cancer, right ([place]) 10/23/2015   Right partial nephrectomy.    Past Surgical History:  Procedure Laterality Date   ABDOMINAL HYSTERECTOMY     BREAST EXCISIONAL BIOPSY Right 2005   neg   BREAST SURGERY  1990s   Biopsy   CERVICAL SPINE SURGERY     Bone fusion   EP IMPLANTABLE DEVICE N/A 06/28/2016   Procedure: Pacemaker Implant;  Surgeon: Deboraha Sprang, MD;  Location: [facility];  Service: Cardiovascular;  Laterality: N/A;   HAMMER TOE SURGERY     HERNIA REPAIR Left    Inguinal Hernia Repair   ROBOTIC ASSITED PARTIAL NEPHRECTOMY Right 10/23/2015   Procedure: ROBOTIC ASSITED PARTIAL NEPHRECTOMY with intraop  ultrasound;  Surgeon: Hollice Espy, MD;  Location: [facility];  Service: Urology;  Laterality: Right;   TUBAL LIGATION      Current Outpatient Prescriptions  Medication Sig Dispense Refill   acetaminophen (TYLENOL) 325 MG tablet Take 2 tablets (650 mg total) by mouth every 6 (six) hours as needed for pain.     ALPRAZolam (XANAX) 0.25 MG tablet Take 1 tablet (0.25 mg total) by mouth 2 (two) times daily as needed for anxiety. 60 tablet 2   brimonidine (ALPHAGAN) 0.2 % ophthalmic solution Place 1 drop into both eyes 2 (two) times daily.     Calcium-Vitamin D (SUPER CALCIUM/D) 600-125 MG-UNIT TABS Take 2 tablets by mouth daily.      Cholecalciferol (D3-1000 PO) Take 1,000 Units by mouth daily. Reported on 07/28/2015     ferrous sulfate 325 (65 FE) MG tablet Take 325 mg by mouth daily with breakfast.     fluticasone (FLONASE) 50 MCG/ACT nasal spray Place 2 sprays into both nostrils daily. (Patient taking differently: Place 2 sprays into both nostrils daily as needed for allergies. ) 16 g 6   gabapentin (NEURONTIN) 100 MG capsule TAKE 1 CAPSULE (100 MG TOTAL) BY MOUTH 3 (THREE) TIMES DAILY. 90 capsule 0   gabapentin (NEURONTIN) 300 MG capsule Take 1 capsule (300 mg total) by mouth  3 (three) times daily. As needed for shingles pain 90 capsule 3   hydroxychloroquine (PLAQUENIL) 200 MG tablet Take 400 mg by mouth daily.      latanoprost (XALATAN) 0.005 % ophthalmic solution Place 1 drop into both eyes at bedtime.      levothyroxine (SYNTHROID, LEVOTHROID) 112 MCG tablet TAKE 1 TABLET  BY MOUTH DAILY BEFORE BREAKFAST. 90 tablet 1   losartan (COZAAR) 100 MG tablet TAKE ONE TABLET BY MOUTH DAILY. NOTE DOSE CHANGED TO '100MG'$ !! 90 tablet 0   mirabegron ER (MYRBETRIQ) 50 MG TB24 tablet Take 1 tablet (50 mg total) by mouth daily. (Patient taking differently: Take 50 mg by mouth every evening. ) 30 tablet 2   mirtazapine (REMERON) 7.5 MG tablet TAKE 1 TABLET (7.5 MG TOTAL) BY MOUTH AT BEDTIME. 30  tablet 5   nitroGLYCERIN (NITROLINGUAL) 0.4 MG/SPRAY spray Place 1 spray under the tongue every 5 (five) minutes x 3 doses as needed for chest pain. 4.9 g 0   Omega-3 Fatty Acids (FISH OIL) 1000 MG CAPS Take 1,000 mg by mouth daily.      pantoprazole (PROTONIX) 40 MG tablet TAKE 1 TABLET (40 MG TOTAL) BY MOUTH DAILY. 90 tablet 3   vitamin C (ASCORBIC ACID) 500 MG tablet Take 500 mg by mouth daily.     No current facility-administered medications for this visit.     Allergies  Allergen Reactions   Adhesive [Tape] Other (See Comments)    "irritate skin"   Codeine     REACTION: NAUSEA   Morphine And Related Other (See Comments)    Shut down her organs   Tetanus Toxoid     REACTION: ARM SWELLING,REDNESS   Tramadol Nausea And Vomiting      Review of Systems negative except from HPI and PMH  Physical Exam BP 132/78 (BP Location: Left Arm, Patient Position: Sitting, Cuff Size: Normal)    Pulse 74    Ht '5\' 2"'$  (1.575 m)    Wt 138 lb (62.6 kg)    BMI 25.24 kg/m  Well developed and well nourished in no acute distress HENT normal E scleral and icterus clear Neck Supple Clear to ausculation Regular rate and rhythm, no murmurs gallops or rub Soft with active bowel sounds No clubbing cyanosis  Edema Alert and oriented, grossly normal motor and sensory function Skin Warm and Dry  ECG demonstrates atrial paced rhythm at 60 Intervals 20/08/47  Assessment and  Plan  Sinus node dysfunction  Pacemaker-Medtronic  Hypertension   Hematoma has resolved  Blood pressures well controlled  No interval atrial fibrillation  Heart rate excursion adequate and largely intrinsic    Current medicines are reviewed at length with the patient today .  The patient does not have concerns regarding medicines.

## 2016-09-11 DIAGNOSIS — Z1211 Encounter for screening for malignant neoplasm of colon: Secondary | ICD-10-CM | POA: Insufficient documentation

## 2016-09-16 ENCOUNTER — Telehealth: Payer: Self-pay

## 2016-09-16 NOTE — Telephone Encounter (Signed)
Patient called the office this afternoon requesting samples of Myrbetriq '50mg'$ .  Please contact the patient to advise.

## 2016-09-16 NOTE — Telephone Encounter (Signed)
Samples were left up front.

## 2016-09-23 NOTE — Progress Notes (Signed)
[place] @ [facility] Telephone:(336) [phone]  Fax:(336) [phone]     Caroline Nichols OB: Jun 16, 1941  MR#: [id number]  LSL#:[id number]  Patient Care Team: [name], MD as PCP - General (Internal Medicine) [name], MD (Internal Medicine) Robert Bellow, MD (General Surgery)  CHIEF COMPLAINT:   Pancytopenia, Renal cell carcinoma of right kidney.  HISTORY OF PRESENT ILLNESS:  Patient returns to clinic today for further evaluation and repeat laboratory work. She continues to take Plaquenil for her rheumatoid arthritis and is tolerating it well. She currently feels well and is asymptomatic. She denies any recent fevers or illnesses. She has a good appetite and denies weight loss. She has no neurologic complaints. She denies any chest pain or shortness of breath. She denies any nausea, vomiting, constipation, or diarrhea. She has no urinary complaints. Patient offers no specific complaints today.   REVIEW OF SYSTEMS:    All other systems reviewed and are negative.    PAST MEDICAL HISTORY: Past Medical History:  Diagnosis Date   Anemia 02/2013   F/U with Dr. Grayland Ormond for Feraheme injections   Anxiety    Arthritis    Possible RA - Follow up appt with Dr. Jefm Bryant   Cellulitis of arm, right    Chronic kidney disease    Diabetes insipidus ([place])    On desmopressin   GERD (gastroesophageal reflux disease)    H/O seasonal allergies    H/O transfusion of packed red blood cells 02/2013   3 units PRBC for severe anemia 02/2013   Herniated disc    History of kidney stones    Hyperlipidemia    Hypertension    Hyperthyroidism    s/p radiation therapy, now hypothyroidism   IBS (irritable bowel syndrome)    Migraine    Pacemaker    a. s/p successful implantation of a Medtronic CapSureFix Novus MRI SureScan serial # [id number] on 06/28/16 by Dr. Caryl Comes for sinus node dysfunction.   Pancreatic cyst    Paroxysmal a-fib (HCC)    Renal cell cancer, right ([place])  10/23/2015   Right partial nephrectomy.    PAST SURGICAL HISTORY: Past Surgical History:  Procedure Laterality Date   ABDOMINAL HYSTERECTOMY     BREAST EXCISIONAL BIOPSY Right 2005   neg   BREAST SURGERY  1990s   Biopsy   CERVICAL SPINE SURGERY     Bone fusion   [place]  2008?   [place]   EP IMPLANTABLE DEVICE N/A 06/28/2016   Procedure: Pacemaker Implant;  Surgeon: Deboraha Sprang, MD;  Location: [place] [facility];  Service: Cardiovascular;  Laterality: N/A;   HAMMER TOE SURGERY     HERNIA REPAIR Left    Inguinal Hernia Repair   ROBOTIC ASSITED PARTIAL NEPHRECTOMY Right 10/23/2015   Procedure: ROBOTIC ASSITED PARTIAL NEPHRECTOMY with intraop ultrasound;  Surgeon: Hollice Espy, MD;  Location: [facility];  Service: Urology;  Laterality: Right;   TUBAL LIGATION      FAMILY HISTORY Family History  Problem Relation Age of Onset   Heart disease Mother    Heart disease Father    Diabetes Brother    Kidney disease Brother    Stroke Daughter     due to medication reaction   Breast cancer Cousin    Prostate cancer Neg Hx    Hematuria Neg Hx     ADVANCED DIRECTIVES:  No flowsheet data found.  HEALTH MAINTENANCE: Social History  Substance Use Topics   Smoking status: Former Smoker    Packs/day:  0.50    Years: 30.00    Types: Cigarettes    Quit date: 12/03/2012   Smokeless tobacco: Never Used   Alcohol use No     Allergies  Allergen Reactions   Adhesive [Tape] Other (See Comments)    "irritate skin"   Codeine     REACTION: NAUSEA   Morphine And Related Other (See Comments)    Shut down her organs   Tetanus Toxoid     REACTION: ARM SWELLING,REDNESS   Tramadol Nausea And Vomiting    Current Outpatient Prescriptions  Medication Sig Dispense Refill   acetaminophen (TYLENOL) 325 MG tablet Take 2 tablets (650 mg total) by mouth every 6 (six) hours as needed for pain.     ALPRAZolam (XANAX) 0.5 MG tablet Take by mouth.      amLODipine (NORVASC) 10 MG tablet Take by mouth.     brimonidine (ALPHAGAN) 0.2 % ophthalmic solution Place 1 drop into both eyes 2 (two) times daily.     Calcium-Vitamin D (SUPER CALCIUM/D) 600-125 MG-UNIT TABS Take 2 tablets by mouth daily.      Cholecalciferol (D3-1000 PO) Take 1,000 Units by mouth daily. Reported on 07/28/2015     DOCOSAHEXAENOIC ACID PO Take by mouth.     ferrous sulfate 325 (65 FE) MG tablet Take 325 mg by mouth daily with breakfast.     fluticasone (FLONASE) 50 MCG/ACT nasal spray Place 2 sprays into both nostrils daily. (Patient taking differently: Place 2 sprays into both nostrils daily as needed for allergies. ) 16 g 6   folic acid (FOLVITE) 1 MG tablet Take by mouth.     gabapentin (NEURONTIN) 300 MG capsule Take 1 capsule (300 mg total) by mouth 3 (three) times daily. As needed for shingles pain 90 capsule 3   hydrALAZINE (APRESOLINE) 10 MG tablet Take by mouth.     hydroxychloroquine (PLAQUENIL) 200 MG tablet Take 400 mg by mouth daily.      latanoprost (XALATAN) 0.005 % ophthalmic solution Place 1 drop into both eyes at bedtime.      levothyroxine (SYNTHROID, LEVOTHROID) 112 MCG tablet TAKE 1 TABLET  BY MOUTH DAILY BEFORE BREAKFAST. 90 tablet 1   losartan (COZAAR) 100 MG tablet TAKE ONE TABLET BY MOUTH DAILY. NOTE DOSE CHANGED TO 100MG!! 90 tablet 0   mirabegron ER (MYRBETRIQ) 50 MG TB24 tablet Take 1 tablet (50 mg total) by mouth daily. (Patient taking differently: Take 50 mg by mouth every evening. ) 30 tablet 2   mirtazapine (REMERON) 7.5 MG tablet TAKE 1 TABLET (7.5 MG TOTAL) BY MOUTH AT BEDTIME. 30 tablet 5   nitroGLYCERIN (NITROLINGUAL) 0.4 MG/SPRAY spray Place 1 spray under the tongue every 5 (five) minutes x 3 doses as needed for chest pain. 4.9 g 0   Omega-3 Fatty Acids (FISH OIL) 1000 MG CAPS Take 1,000 mg by mouth daily.      pantoprazole (PROTONIX) 40 MG tablet TAKE 1 TABLET (40 MG TOTAL) BY MOUTH DAILY. 90 tablet 3   tiZANidine  (ZANAFLEX) 4 MG tablet Take by mouth.     vitamin C (ASCORBIC ACID) 500 MG tablet Take 500 mg by mouth daily.     No current facility-administered medications for this visit.     OBJECTIVE:  Vitals:   09/24/16 1248  BP: 121/77  Pulse: 63  Temp: 98.9 F (37.2 C)     Body mass index is 25.65 kg/m.    ECOG FS:0 - Asymptomatic  Physical Exam   General: Well-developed, well-nourished, no  acute distress. Eyes: Pink conjunctiva, anicteric sclera. Lungs: Clear to auscultation bilaterally. Heart: Regular rate and rhythm. No rubs, murmurs, or gallops. Abdomen: Soft, nontender, nondistended. No organomegaly noted, normoactive bowel sounds. Musculoskeletal: No edema, cyanosis, or clubbing. Neuro: Alert, answering all questions appropriately. Cranial nerves grossly intact. Skin: No rashes or petechiae noted. Psych: Normal affect.   LAB RESULTS:  Appointment on 09/24/2016  Component Date Value Ref Range Status   WBC 09/24/2016 4.0  3.6 - 11.0 K/uL Final   RBC 09/24/2016 4.21  3.80 - 5.20 MIL/uL Final   Hemoglobin 09/24/2016 12.6  12.0 - 16.0 g/dL Final   HCT 09/24/2016 36.9  35.0 - 47.0 % Final   MCV 09/24/2016 87.8  80.0 - 100.0 fL Final   MCH 09/24/2016 29.9  26.0 - 34.0 pg Final   MCHC 09/24/2016 34.0  32.0 - 36.0 g/dL Final   RDW 09/24/2016 16.3* 11.5 - 14.5 % Final   Platelets 09/24/2016 110* 150 - 440 K/uL Final   Neutrophils Relative % 09/24/2016 50  % Final   Neutro Abs 09/24/2016 2.0  1.4 - 6.5 K/uL Final   Lymphocytes Relative 09/24/2016 20  % Final   Lymphs Abs 09/24/2016 0.8* 1.0 - 3.6 K/uL Final   Monocytes Relative 09/24/2016 29  % Final   Monocytes Absolute 09/24/2016 1.2* 0.2 - 0.9 K/uL Final   Eosinophils Relative 09/24/2016 0  % Final   Eosinophils Absolute 09/24/2016 0.0  0 - 0.7 K/uL Final   Basophils Relative 09/24/2016 1  % Final   Basophils Absolute 09/24/2016 0.0  0 - 0.1 K/uL Final       STUDIES: No results  found.  ASSESSMENT:  Pancytopenia, Renal cell carcinoma of right kidney   PLAN:  1. Pancytopenia: Previously, bone marrow biopsy revealed no evidence of MDS or other abnormality. Her white blood cell count and hemoglobin are within normal limits. Platelet count continue to be decreased, but relatively stable and at her baseline. It is possible it is related to her Plaquenil or rheumatoid arthritis. The remainder of her laboratory work was either negative or within normal limits. No intervention is needed at this time. After lengthy discussion with the patient, it was agreed upon that no further follow up is necessary.  Please refer her back if there are any questions or concerns. 2. Renal cell carcinoma of the right kidney: Status post partial nephrectomy. Continue follow-up with urology as indicated. 3. Rheumatoid arthritis: Continue Plaquenil. Continue follow-up per rheumatology.   Patient expressed understanding and was in agreement with this plan. She also understands that She can call clinic at any time with any questions, concerns, or complaints.    Lloyd Huger, MD   09/28/2016 10:36 AM

## 2016-09-24 ENCOUNTER — Encounter: Payer: Self-pay | Admitting: Oncology

## 2016-09-24 ENCOUNTER — Inpatient Hospital Stay: Payer: Medicare Other

## 2016-09-24 ENCOUNTER — Inpatient Hospital Stay: Payer: Medicare Other | Attending: Oncology | Admitting: Oncology

## 2016-09-24 VITALS — BP 121/77 | HR 63 | Temp 98.9°F | Wt 140.2 lb

## 2016-09-24 DIAGNOSIS — I129 Hypertensive chronic kidney disease with stage 1 through stage 4 chronic kidney disease, or unspecified chronic kidney disease: Secondary | ICD-10-CM | POA: Insufficient documentation

## 2016-09-24 DIAGNOSIS — D61818 Other pancytopenia: Secondary | ICD-10-CM | POA: Diagnosis not present

## 2016-09-24 DIAGNOSIS — Z87442 Personal history of urinary calculi: Secondary | ICD-10-CM | POA: Diagnosis not present

## 2016-09-24 DIAGNOSIS — Z8669 Personal history of other diseases of the nervous system and sense organs: Secondary | ICD-10-CM | POA: Diagnosis not present

## 2016-09-24 DIAGNOSIS — C641 Malignant neoplasm of right kidney, except renal pelvis: Secondary | ICD-10-CM | POA: Insufficient documentation

## 2016-09-24 DIAGNOSIS — E785 Hyperlipidemia, unspecified: Secondary | ICD-10-CM | POA: Insufficient documentation

## 2016-09-24 DIAGNOSIS — D649 Anemia, unspecified: Secondary | ICD-10-CM | POA: Insufficient documentation

## 2016-09-24 DIAGNOSIS — Z923 Personal history of irradiation: Secondary | ICD-10-CM | POA: Insufficient documentation

## 2016-09-24 DIAGNOSIS — M069 Rheumatoid arthritis, unspecified: Secondary | ICD-10-CM

## 2016-09-24 DIAGNOSIS — K589 Irritable bowel syndrome without diarrhea: Secondary | ICD-10-CM | POA: Insufficient documentation

## 2016-09-24 DIAGNOSIS — N189 Chronic kidney disease, unspecified: Secondary | ICD-10-CM | POA: Insufficient documentation

## 2016-09-24 DIAGNOSIS — I480 Paroxysmal atrial fibrillation: Secondary | ICD-10-CM | POA: Diagnosis not present

## 2016-09-24 DIAGNOSIS — E039 Hypothyroidism, unspecified: Secondary | ICD-10-CM | POA: Diagnosis not present

## 2016-09-24 DIAGNOSIS — K219 Gastro-esophageal reflux disease without esophagitis: Secondary | ICD-10-CM | POA: Insufficient documentation

## 2016-09-24 DIAGNOSIS — Z79899 Other long term (current) drug therapy: Secondary | ICD-10-CM | POA: Diagnosis not present

## 2016-09-24 DIAGNOSIS — F419 Anxiety disorder, unspecified: Secondary | ICD-10-CM | POA: Insufficient documentation

## 2016-09-24 DIAGNOSIS — E232 Diabetes insipidus: Secondary | ICD-10-CM | POA: Insufficient documentation

## 2016-09-24 DIAGNOSIS — Z8619 Personal history of other infectious and parasitic diseases: Secondary | ICD-10-CM | POA: Insufficient documentation

## 2016-09-24 LAB — CBC WITH DIFFERENTIAL/PLATELET
Basophils Absolute: 0 10*3/uL (ref 0–0.1)
Basophils Relative: 1 %
EOS PCT: 0 %
Eosinophils Absolute: 0 10*3/uL (ref 0–0.7)
HEMATOCRIT: 36.9 % (ref 35.0–47.0)
Hemoglobin: 12.6 g/dL (ref 12.0–16.0)
LYMPHS ABS: 0.8 10*3/uL — AB (ref 1.0–3.6)
LYMPHS PCT: 20 %
MCH: 29.9 pg (ref 26.0–34.0)
MCHC: 34 g/dL (ref 32.0–36.0)
MCV: 87.8 fL (ref 80.0–100.0)
MONO ABS: 1.2 10*3/uL — AB (ref 0.2–0.9)
Monocytes Relative: 29 %
NEUTROS ABS: 2 10*3/uL (ref 1.4–6.5)
Neutrophils Relative %: 50 %
PLATELETS: 110 10*3/uL — AB (ref 150–440)
RBC: 4.21 MIL/uL (ref 3.80–5.20)
RDW: 16.3 % — ABNORMAL HIGH (ref 11.5–14.5)
WBC: 4 10*3/uL (ref 3.6–11.0)

## 2016-10-01 ENCOUNTER — Other Ambulatory Visit: Payer: Self-pay | Admitting: Internal Medicine

## 2016-10-01 NOTE — Telephone Encounter (Signed)
Refilled on 07/18/2016 with 3 refills. Last office visit 07/18/2016. Next appt is 01/16/2017. Please advise.

## 2016-10-02 NOTE — Telephone Encounter (Signed)
Refilled 90 days

## 2016-10-07 ENCOUNTER — Telehealth: Payer: Self-pay | Admitting: *Deleted

## 2016-10-07 NOTE — Telephone Encounter (Signed)
Message left on home and cell numbers for patient to call the office.   Patient is scheduled for a colonoscopy on 10-15-16 at [facility]  We need to confirm the patient has not had any medication changes since her last office visit.   Also, need to verify that patient has picked up her Miralax prescription.   We do need to remind patient that she will need to stop fish oil one week prior.

## 2016-10-08 NOTE — Telephone Encounter (Signed)
Spoke with the patient and she has had no medication changes. She has her Miralax prescription and is aware to stop her fish oil one week prior. She has no other questions for now.

## 2016-10-11 DIAGNOSIS — D72819 Decreased white blood cell count, unspecified: Secondary | ICD-10-CM | POA: Diagnosis not present

## 2016-10-11 DIAGNOSIS — R748 Abnormal levels of other serum enzymes: Secondary | ICD-10-CM | POA: Diagnosis not present

## 2016-10-11 DIAGNOSIS — M0579 Rheumatoid arthritis with rheumatoid factor of multiple sites without organ or systems involvement: Secondary | ICD-10-CM | POA: Diagnosis not present

## 2016-10-11 DIAGNOSIS — G629 Polyneuropathy, unspecified: Secondary | ICD-10-CM | POA: Diagnosis not present

## 2016-10-13 ENCOUNTER — Other Ambulatory Visit: Payer: Self-pay | Admitting: Internal Medicine

## 2016-10-14 NOTE — Telephone Encounter (Signed)
This is a historical med, last OV was 06/03/2016, please advise refill, thanks

## 2016-10-14 NOTE — Telephone Encounter (Signed)
REFILLED.  NOT DC'D

## 2016-10-15 ENCOUNTER — Ambulatory Visit
Admission: RE | Admit: 2016-10-15 | Discharge: 2016-10-15 | Disposition: A | Discharge status: Home / Self Care | Payer: Medicare Other | Source: Ambulatory Visit | Attending: General Surgery | Admitting: General Surgery

## 2016-10-15 ENCOUNTER — Encounter
Admission: RE | Disposition: A | Discharge status: Home / Self Care | Payer: Self-pay | Source: Ambulatory Visit | Attending: General Surgery

## 2016-10-15 ENCOUNTER — Encounter: Payer: Self-pay | Admitting: Anesthesiology

## 2016-10-15 ENCOUNTER — Ambulatory Visit: Payer: Medicare Other | Admitting: Anesthesiology

## 2016-10-15 DIAGNOSIS — Z950 Presence of cardiac pacemaker: Secondary | ICD-10-CM | POA: Diagnosis not present

## 2016-10-15 DIAGNOSIS — E785 Hyperlipidemia, unspecified: Secondary | ICD-10-CM | POA: Insufficient documentation

## 2016-10-15 DIAGNOSIS — Z923 Personal history of irradiation: Secondary | ICD-10-CM | POA: Diagnosis not present

## 2016-10-15 DIAGNOSIS — K219 Gastro-esophageal reflux disease without esophagitis: Secondary | ICD-10-CM | POA: Diagnosis not present

## 2016-10-15 DIAGNOSIS — K5730 Diverticulosis of large intestine without perforation or abscess without bleeding: Secondary | ICD-10-CM | POA: Insufficient documentation

## 2016-10-15 DIAGNOSIS — Z8553 Personal history of malignant neoplasm of renal pelvis: Secondary | ICD-10-CM | POA: Insufficient documentation

## 2016-10-15 DIAGNOSIS — K589 Irritable bowel syndrome without diarrhea: Secondary | ICD-10-CM | POA: Insufficient documentation

## 2016-10-15 DIAGNOSIS — K621 Rectal polyp: Secondary | ICD-10-CM | POA: Diagnosis not present

## 2016-10-15 DIAGNOSIS — E039 Hypothyroidism, unspecified: Secondary | ICD-10-CM | POA: Diagnosis not present

## 2016-10-15 DIAGNOSIS — D649 Anemia, unspecified: Secondary | ICD-10-CM | POA: Insufficient documentation

## 2016-10-15 DIAGNOSIS — R51 Headache: Secondary | ICD-10-CM | POA: Insufficient documentation

## 2016-10-15 DIAGNOSIS — Z79899 Other long term (current) drug therapy: Secondary | ICD-10-CM | POA: Insufficient documentation

## 2016-10-15 DIAGNOSIS — F329 Major depressive disorder, single episode, unspecified: Secondary | ICD-10-CM | POA: Insufficient documentation

## 2016-10-15 DIAGNOSIS — E232 Diabetes insipidus: Secondary | ICD-10-CM | POA: Diagnosis not present

## 2016-10-15 DIAGNOSIS — M1990 Unspecified osteoarthritis, unspecified site: Secondary | ICD-10-CM | POA: Insufficient documentation

## 2016-10-15 DIAGNOSIS — F419 Anxiety disorder, unspecified: Secondary | ICD-10-CM | POA: Insufficient documentation

## 2016-10-15 DIAGNOSIS — I480 Paroxysmal atrial fibrillation: Secondary | ICD-10-CM | POA: Diagnosis not present

## 2016-10-15 DIAGNOSIS — Z1211 Encounter for screening for malignant neoplasm of colon: Secondary | ICD-10-CM | POA: Diagnosis not present

## 2016-10-15 DIAGNOSIS — Z87891 Personal history of nicotine dependence: Secondary | ICD-10-CM | POA: Insufficient documentation

## 2016-10-15 DIAGNOSIS — R195 Other fecal abnormalities: Secondary | ICD-10-CM | POA: Diagnosis not present

## 2016-10-15 DIAGNOSIS — K5790 Diverticulosis of intestine, part unspecified, without perforation or abscess without bleeding: Secondary | ICD-10-CM | POA: Diagnosis not present

## 2016-10-15 DIAGNOSIS — N189 Chronic kidney disease, unspecified: Secondary | ICD-10-CM | POA: Diagnosis not present

## 2016-10-15 DIAGNOSIS — I129 Hypertensive chronic kidney disease with stage 1 through stage 4 chronic kidney disease, or unspecified chronic kidney disease: Secondary | ICD-10-CM | POA: Insufficient documentation

## 2016-10-15 HISTORY — PX: COLONOSCOPY WITH PROPOFOL: SHX5780

## 2016-10-15 SURGERY — COLONOSCOPY WITH PROPOFOL
Anesthesia: General

## 2016-10-15 MED ORDER — PROPOFOL 10 MG/ML IV BOLUS
INTRAVENOUS | Status: DC | PRN
  Administered 2016-10-15 (×4): 30 mg via INTRAVENOUS

## 2016-10-15 MED ORDER — SODIUM CHLORIDE 0.9 % IV SOLN
INTRAVENOUS | Status: DC
  Administered 2016-10-15: 13:00:00 via INTRAVENOUS
  Administered 2016-10-15: 1000 mL via INTRAVENOUS

## 2016-10-15 MED ORDER — LIDOCAINE HCL (PF) 1 % IJ SOLN
2.0000 mL | Freq: Once | INTRAMUSCULAR | Status: AC
  Administered 2016-10-15: 0.3 mL via INTRADERMAL
  Filled 2016-10-15: qty 2

## 2016-10-15 MED ORDER — PROPOFOL 500 MG/50ML IV EMUL
INTRAVENOUS | Status: DC | PRN
  Administered 2016-10-15: 75 ug/kg/min via INTRAVENOUS

## 2016-10-15 MED ORDER — PROPOFOL 500 MG/50ML IV EMUL
INTRAVENOUS | Status: AC
  Filled 2016-10-15: qty 50

## 2016-10-15 NOTE — Op Note (Signed)
[facility]astroenterology Patient Name: Caroline Nichols Procedure Date: 10/15/2016 12:51 PM MRN: [id number] Account #: [id number] Date of Birth: 30-Sep-1940 Admit Type: Outpatient Age: 76 Room: [facility] ENDO ROOM 1 Gender: Female Note Status: Finalized Procedure:            Colonoscopy Indications:          Positive Cologuard test Providers:            Robert Bellow, MD Referring MD:         Deborra Medina, MD (Referring MD) Medicines:            Monitored Anesthesia Care Complications:        No immediate complications. Procedure:            Pre-Anesthesia Assessment:                       - Prior to the procedure, a History and Physical was                        performed, and patient medications, allergies and                        sensitivities were reviewed. The patient's tolerance of                        previous anesthesia was reviewed.                       - The risks and benefits of the procedure and the                        sedation options and risks were discussed with the                        patient. All questions were answered and informed                        consent was obtained.                       After obtaining informed consent, the colonoscope was                        passed under direct vision. Throughout the procedure,                        the patient's blood pressure, pulse, and oxygen                        saturations were monitored continuously. The                        Colonoscope was introduced through the anus and                        advanced to the the cecum, identified by appendiceal                        orifice and ileocecal valve. The colonoscopy was  extremely difficult due to multiple diverticula in the                        colon, significant looping and a tortuous colon.                        Successful completion of the procedure was aided by                        changing the  patient to a prone position and using                        manual pressure. The patient tolerated the procedure                        well. The quality of the bowel preparation was                        excellent. Findings:      Multiple sessile polyps were found in the rectum (benign-appearing       lesion). The polyps were 5 mm in size. These polyps were removed with a       cold biopsy forceps. Resection and retrieval were complete.      The retroflexed view of the distal rectum and anal verge was normal and       showed no anal or rectal abnormalities. Impression:           - Multiple benign appearing 5 mm polyps in the rectum,                        removed with a cold biopsy forceps. Resected and                        retrieved.                       - The distal rectum and anal verge are normal on                        retroflexion view. Recommendation:       - Telephone endoscopist for pathology results in 1 week. Procedure Code(s):    --- Professional ---                       [phone], Colonoscopy, flexible; with biopsy, single or                        multiple Diagnosis Code(s):    --- Professional ---                       K62.1, Rectal polyp                       R19.5, Other fecal abnormalities CPT copyright 2016 American Medical Association. All rights reserved. The codes documented in this report are preliminary and upon coder review may  be revised to meet current compliance requirements. Robert Bellow, MD 10/15/2016 1:53:08 PM This report has been signed electronically. Number of Addenda: 0 Note Initiated On: 10/15/2016 12:51 PM Scope Withdrawal Time: 0 hours 17 minutes 28 seconds  Total Procedure  Duration: 0 hours 45 minutes 50 seconds       [facility]

## 2016-10-15 NOTE — Transfer of Care (Signed)
Immediate Anesthesia Transfer of Care Note  Patient: Caroline Nichols  Procedure(s) Performed: Procedure(s): COLONOSCOPY WITH PROPOFOL (N/A)  Patient Location: PACU  Anesthesia Type:General  Level of Consciousness: sedated  Airway & Oxygen Therapy: Patient Spontanous Breathing and Patient connected to nasal cannula oxygen  Post-op Assessment: Report given to RN and Post -op Vital signs reviewed and stable  Post vital signs: Reviewed and stable  Last Vitals:  Vitals:   10/15/16 1208  BP: (!) 206/120  Pulse: 88  Resp: 17  Temp: 36.3 C    Last Pain:  Vitals:   10/15/16 1208  TempSrc: Tympanic         Complications:

## 2016-10-15 NOTE — H&P (Signed)
Caroline Nichols [id number] 02/07/1941     HPI:  76 year old woman who has had a positive Cologuard test. For colonoscopy.   Prescriptions Prior to Admission  Medication Sig Dispense Refill Last Dose   acetaminophen (TYLENOL) 325 MG tablet Take 2 tablets (650 mg total) by mouth every 6 (six) hours as needed for pain.   Taking   ALPRAZolam (XANAX) 0.25 MG tablet TAKE ONE TABLET BY MOUTH TWO TIMES A DAY AS NEEDED FOR ANXIETY 60 tablet 1    ALPRAZolam (XANAX) 0.5 MG tablet Take by mouth.   Taking   amLODipine (NORVASC) 10 MG tablet Take by mouth.   Taking   brimonidine (ALPHAGAN) 0.2 % ophthalmic solution Place 1 drop into both eyes 2 (two) times daily.   Taking   Calcium-Vitamin D (SUPER CALCIUM/D) 600-125 MG-UNIT TABS Take 2 tablets by mouth daily.    Taking   Cholecalciferol (D3-1000 PO) Take 1,000 Units by mouth daily. Reported on 07/28/2015   Taking   DOCOSAHEXAENOIC ACID PO Take by mouth.   Taking   ferrous sulfate 325 (65 FE) MG tablet Take 325 mg by mouth daily with breakfast.   Taking   fluticasone (FLONASE) 50 MCG/ACT nasal spray Place 2 sprays into both nostrils daily. (Patient taking differently: Place 2 sprays into both nostrils daily as needed for allergies. ) 16 g 6 Taking   folic acid (FOLVITE) 1 MG tablet Take by mouth.   Taking   gabapentin (NEURONTIN) 100 MG capsule TAKE 1 CAPSULE (100 MG TOTAL) BY MOUTH 3 (THREE) TIMES DAILY. 90 capsule 2    gabapentin (NEURONTIN) 300 MG capsule Take 1 capsule (300 mg total) by mouth 3 (three) times daily. As needed for shingles pain 90 capsule 3 Taking   hydrALAZINE (APRESOLINE) 10 MG tablet Take by mouth.   Taking   hydroxychloroquine (PLAQUENIL) 200 MG tablet Take 400 mg by mouth daily.    Taking   latanoprost (XALATAN) 0.005 % ophthalmic solution Place 1 drop into both eyes at bedtime.    Taking   levothyroxine (SYNTHROID, LEVOTHROID) 112 MCG tablet TAKE 1 TABLET  BY MOUTH DAILY BEFORE BREAKFAST. 90 tablet 1 Taking    losartan (COZAAR) 100 MG tablet TAKE ONE TABLET BY MOUTH DAILY. NOTE DOSE CHANGED TO '100MG'$ !! 90 tablet 0 Taking   mirabegron ER (MYRBETRIQ) 50 MG TB24 tablet Take 1 tablet (50 mg total) by mouth daily. (Patient taking differently: Take 50 mg by mouth every evening. ) 30 tablet 2 Taking   mirtazapine (REMERON) 7.5 MG tablet TAKE 1 TABLET (7.5 MG TOTAL) BY MOUTH AT BEDTIME. 30 tablet 5 Taking   nitroGLYCERIN (NITROLINGUAL) 0.4 MG/SPRAY spray Place 1 spray under the tongue every 5 (five) minutes x 3 doses as needed for chest pain. 4.9 g 0 Taking   Omega-3 Fatty Acids (FISH OIL) 1000 MG CAPS Take 1,000 mg by mouth daily.    Taking   pantoprazole (PROTONIX) 40 MG tablet TAKE 1 TABLET (40 MG TOTAL) BY MOUTH DAILY. 90 tablet 3 Taking   tiZANidine (ZANAFLEX) 4 MG tablet Take by mouth.   Taking   vitamin C (ASCORBIC ACID) 500 MG tablet Take 500 mg by mouth daily.   Taking   Allergies  Allergen Reactions   Adhesive [Tape] Other (See Comments)    "irritate skin"   Codeine     REACTION: NAUSEA   Morphine And Related Other (See Comments)    Shut down her organs   Tetanus Toxoid     REACTION: ARM  SWELLING,REDNESS   Tramadol Nausea And Vomiting   Past Medical History:  Diagnosis Date   Anemia 02/2013   F/U with Dr. Grayland Ormond for Feraheme injections   Anxiety    Arthritis    Possible RA - Follow up appt with Dr. Jefm Bryant   Cellulitis of arm, right    Chronic kidney disease    Diabetes insipidus ([place])    On desmopressin   GERD (gastroesophageal reflux disease)    H/O seasonal allergies    H/O transfusion of packed red blood cells 02/2013   3 units PRBC for severe anemia 02/2013   Herniated disc    History of kidney stones    Hyperlipidemia    Hypertension    Hyperthyroidism    s/p radiation therapy, now hypothyroidism   IBS (irritable bowel syndrome)    Migraine    Pacemaker    a. s/p successful implantation of a Medtronic CapSureFix Novus MRI SureScan serial #  [id number] on 06/28/16 by Dr. Caryl Comes for sinus node dysfunction.   Pancreatic cyst    Paroxysmal a-fib (HCC)    Renal cell cancer, right ([place]) 10/23/2015   Right partial nephrectomy.   Past Surgical History:  Procedure Laterality Date   ABDOMINAL HYSTERECTOMY     BREAST EXCISIONAL BIOPSY Right 2005   neg   BREAST SURGERY  1990s   Biopsy   CERVICAL SPINE SURGERY     Bone fusion   COLONOSCOPY  2008?   [place]   EP IMPLANTABLE DEVICE N/A 06/28/2016   Procedure: Pacemaker Implant;  Surgeon: Deboraha Sprang, MD;  Location: [place] [facility];  Service: Cardiovascular;  Laterality: N/A;   HAMMER TOE SURGERY     HERNIA REPAIR Left    Inguinal Hernia Repair   ROBOTIC ASSITED PARTIAL NEPHRECTOMY Right 10/23/2015   Procedure: ROBOTIC ASSITED PARTIAL NEPHRECTOMY with intraop ultrasound;  Surgeon: Hollice Espy, MD;  Location: [facility] ORS;  Service: Urology;  Laterality: Right;   TUBAL LIGATION     Social History   Social History   Marital status: Widowed    Spouse name: N/A   Number of children: 4   Years of education: 65   Occupational History   [employer]iles     Retired   Engineer, 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 She is a widow since 66. She was married for 48 years. They had 4 children (daughters). She is currently leaving at [facility] since June. 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 She enjoys shopping. She also enjoys music, word puzzles and she loves spending time with her family. She loves attending church. One of her  favorite things is wearing hats. She absolutely loves hats!   Social History   Social History Narrative   Caroline Nichols was born and reared in [place]. She is a widow since 24. She was married for 48 years. They had 4 children (daughters). She is currently leaving at [facility] since June. 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 She enjoys shopping. She also enjoys music, word puzzles and she loves spending time with her family. She loves attending church. One of her favorite things is wearing hats. She absolutely loves hats!     ROS: Negative.     PE: HEENT: Negative. Lungs: Clear. Cardio: RR.  Assessment/Plan:  Proceed with planned endoscopy.  Robert Bellow 10/15/2016

## 2016-10-15 NOTE — Anesthesia Post-op Follow-up Note (Cosign Needed)
Anesthesia QCDR form completed.        

## 2016-10-15 NOTE — Anesthesia Preprocedure Evaluation (Signed)
Anesthesia Evaluation  Patient identified by MRN, date of birth, ID band Patient awake    Reviewed: Allergy & Precautions, NPO status , Patient's Chart, lab work & pertinent test results  History of Anesthesia Complications Negative for: history of anesthetic complications  Airway Mallampati: II  TM Distance: >3 FB Neck ROM: Full    Dental  (+) Upper Dentures, Lower Dentures   Pulmonary neg sleep apnea, neg COPD, former smoker,    breath sounds clear to auscultation- rhonchi (-) wheezing      Cardiovascular hypertension, Pt. on medications (-) CAD and (-) Past MI + pacemaker  Rhythm:Regular Rate:Normal - Systolic murmurs and - Diastolic murmurs    Neuro/Psych  Headaches, PSYCHIATRIC DISORDERS Anxiety Depression    GI/Hepatic Neg liver ROS, GERD  ,  Endo/Other  Hypothyroidism   Renal/GU Renal disease: hx of renal carcinoma s/p resection.     Musculoskeletal  (+) Arthritis ,   Abdominal (+) - obese,   Peds  Hematology  (+) anemia ,   Anesthesia Other Findings Past Medical History: 02/2013: Anemia     Comment: F/U with Dr. Grayland Ormond for Feraheme injections No date: Anxiety No date: Arthritis     Comment: Possible RA - Follow up appt with Dr. Jefm Bryant No date: Cellulitis of arm, right No date: Chronic kidney disease No date: Diabetes insipidus ([place])     Comment: On desmopressin No date: GERD (gastroesophageal reflux disease) No date: H/O seasonal allergies 02/2013: H/O transfusion of packed red blood cells     Comment: 3 units PRBC for severe anemia 02/2013 No date: Herniated disc No date: History of kidney stones No date: Hyperlipidemia No date: Hypertension No date: Hyperthyroidism     Comment: s/p radiation therapy, now hypothyroidism No date: IBS (irritable bowel syndrome) No date: Migraine No date: Pacemaker     Comment: a. s/p successful implantation of a Medtronic               CapSureFix Novus MRI  SureScan serial #               [id number] H on 06/28/16 by Dr. Caryl Comes for sinus               node dysfunction. No date: Pancreatic cyst No date: Paroxysmal a-fib ([place]) 10/23/2015: Renal cell cancer, right (HCC)     Comment: Right partial nephrectomy.   Reproductive/Obstetrics                             Anesthesia Physical Anesthesia Plan  ASA: III  Anesthesia Plan: General   Post-op Pain Management:    Induction: Intravenous  Airway Management Planned: Natural Airway  Additional Equipment:   Intra-op Plan:   Post-operative Plan:   Informed Consent: I have reviewed the patients History and Physical, chart, labs and discussed the procedure including the risks, benefits and alternatives for the proposed anesthesia with the patient or authorized representative who has indicated his/her understanding and acceptance.   Dental advisory given  Plan Discussed with: CRNA and Anesthesiologist  Anesthesia Plan Comments:         Anesthesia Quick Evaluation

## 2016-10-15 NOTE — Anesthesia Postprocedure Evaluation (Signed)
Anesthesia Post Note  Patient: Caroline Nichols  Procedure(s) Performed: Procedure(s) (LRB): COLONOSCOPY WITH PROPOFOL (N/A)  Anesthesia Type: General Level of consciousness: awake and alert and oriented Pain management: pain level controlled Vital Signs Assessment: post-procedure vital signs reviewed and stable Respiratory status: spontaneous breathing Cardiovascular status: blood pressure returned to baseline Anesthetic complications: no     Last Vitals:  Vitals:   10/15/16 1416 10/15/16 1426  BP: (!) 202/89 (!) 207/103  Pulse: 70 73  Resp: 14 18  Temp:      Last Pain:  Vitals:   10/15/16 1406  TempSrc:   PainSc: 5                  Mychael Smock

## 2016-10-16 LAB — SURGICAL PATHOLOGY

## 2016-10-17 ENCOUNTER — Telehealth: Payer: Self-pay

## 2016-10-17 NOTE — Telephone Encounter (Signed)
-----   Message from Robert Bellow, MD sent at 10/16/2016  8:46 PM EDT ----- Please notify the patient that the biopsies were fine. No additional testing needed.  ----- Message ----- From: Interface, Lab In Three Zero One Sent: 10/16/2016   1:22 PM To: Robert Bellow, MD

## 2016-10-17 NOTE — Telephone Encounter (Signed)
Notified patient as instructed, patient pleased. Discussed follow-up appointments, patient agrees  

## 2016-10-22 ENCOUNTER — Telehealth: Payer: Self-pay | Admitting: Internal Medicine

## 2016-10-22 DIAGNOSIS — M25562 Pain in left knee: Secondary | ICD-10-CM | POA: Diagnosis not present

## 2016-10-22 DIAGNOSIS — G629 Polyneuropathy, unspecified: Secondary | ICD-10-CM | POA: Diagnosis not present

## 2016-10-22 DIAGNOSIS — G8929 Other chronic pain: Secondary | ICD-10-CM | POA: Diagnosis not present

## 2016-10-22 DIAGNOSIS — M0579 Rheumatoid arthritis with rheumatoid factor of multiple sites without organ or systems involvement: Secondary | ICD-10-CM | POA: Diagnosis not present

## 2016-10-22 LAB — CUP PACEART INCLINIC DEVICE CHECK
Brady Statistic AP VS Percent: 33.36 %
Brady Statistic AS VP Percent: 0.08 %
Brady Statistic AS VS Percent: 66.08 %
Implantable Lead Implant Date: 20171124
Implantable Lead Location: 753859
Implantable Lead Location: 753860
Implantable Lead Model: 5076
Implantable Pulse Generator Implant Date: 20171124
Lead Channel Impedance Value: 323 Ohm
Lead Channel Impedance Value: 418 Ohm
Lead Channel Pacing Threshold Amplitude: 0.75 V
Lead Channel Pacing Threshold Pulse Width: 0.4 ms
Lead Channel Pacing Threshold Pulse Width: 0.4 ms
Lead Channel Setting Pacing Amplitude: 2 V
Lead Channel Setting Pacing Amplitude: 2.5 V
Lead Channel Setting Pacing Pulse Width: 0.4 ms
MDC IDC LEAD IMPLANT DT: 20171124
MDC IDC MSMT BATTERY REMAINING LONGEVITY: 132 mo
MDC IDC MSMT BATTERY VOLTAGE: 3.07 V
MDC IDC MSMT LEADCHNL RA IMPEDANCE VALUE: 380 Ohm
MDC IDC MSMT LEADCHNL RA PACING THRESHOLD AMPLITUDE: 0.5 V
MDC IDC MSMT LEADCHNL RV IMPEDANCE VALUE: 532 Ohm
MDC IDC MSMT LEADCHNL RV SENSING INTR AMPL: 20.5 mV
MDC IDC SESS DTM: 20180206152143
MDC IDC SET LEADCHNL RV SENSING SENSITIVITY: 0.9 mV
MDC IDC STAT BRADY AP VP PERCENT: 0.48 %
MDC IDC STAT BRADY RA PERCENT PACED: 33.73 %
MDC IDC STAT BRADY RV PERCENT PACED: 0.56 %

## 2016-10-22 NOTE — Telephone Encounter (Signed)
Pt calling stating in nov we put a pacer maker in her  A few days now she is noticing a pin is sticking her under her skin  Would like to know what to do about this.

## 2016-10-23 NOTE — Telephone Encounter (Signed)
Patient expressed concern about feeling "something" underneath her skin. She stated that it was intermittent and inconsistent in its occurences. She had a previous hematoma and was seen on 09/10/16. When I asked about the healing process of her hematoma she summarized that it was possible that the hematoma had gotten better and thus she was noticing the leads/device more. She stated that she was no longer concerned and would call if anything worsened. She mentioned some fatigue for which I offered for her to send a manual transmission. She declined at this time but will call if she changes her mind.

## 2016-11-21 ENCOUNTER — Other Ambulatory Visit: Payer: Self-pay | Admitting: Internal Medicine

## 2016-11-28 ENCOUNTER — Telehealth: Payer: Self-pay | Admitting: Urology

## 2016-11-28 NOTE — Telephone Encounter (Signed)
Samples are left upfront.

## 2016-11-28 NOTE — Telephone Encounter (Signed)
Pt would like some 50 mg Myrbetriq samples.

## 2016-12-06 DIAGNOSIS — Z79899 Other long term (current) drug therapy: Secondary | ICD-10-CM | POA: Diagnosis not present

## 2016-12-06 DIAGNOSIS — M069 Rheumatoid arthritis, unspecified: Secondary | ICD-10-CM | POA: Diagnosis not present

## 2016-12-06 DIAGNOSIS — H401132 Primary open-angle glaucoma, bilateral, moderate stage: Secondary | ICD-10-CM | POA: Diagnosis not present

## 2016-12-06 DIAGNOSIS — H47233 Glaucomatous optic atrophy, bilateral: Secondary | ICD-10-CM | POA: Diagnosis not present

## 2016-12-06 DIAGNOSIS — H5203 Hypermetropia, bilateral: Secondary | ICD-10-CM | POA: Diagnosis not present

## 2016-12-06 DIAGNOSIS — H25013 Cortical age-related cataract, bilateral: Secondary | ICD-10-CM | POA: Diagnosis not present

## 2016-12-06 DIAGNOSIS — H1850 Unspecified hereditary corneal dystrophies: Secondary | ICD-10-CM | POA: Diagnosis not present

## 2016-12-06 DIAGNOSIS — H52223 Regular astigmatism, bilateral: Secondary | ICD-10-CM | POA: Diagnosis not present

## 2016-12-06 DIAGNOSIS — I10 Essential (primary) hypertension: Secondary | ICD-10-CM | POA: Diagnosis not present

## 2016-12-06 DIAGNOSIS — H524 Presbyopia: Secondary | ICD-10-CM | POA: Diagnosis not present

## 2016-12-07 ENCOUNTER — Other Ambulatory Visit: Payer: Self-pay | Admitting: Internal Medicine

## 2016-12-10 ENCOUNTER — Ambulatory Visit (INDEPENDENT_AMBULATORY_CARE_PROVIDER_SITE_OTHER): Payer: Medicare Other | Admitting: *Deleted

## 2016-12-10 DIAGNOSIS — I495 Sick sinus syndrome: Secondary | ICD-10-CM | POA: Diagnosis not present

## 2016-12-10 NOTE — Progress Notes (Signed)
Remote pacemaker transmission.   

## 2016-12-12 ENCOUNTER — Other Ambulatory Visit: Payer: Self-pay | Admitting: Internal Medicine

## 2016-12-12 LAB — CUP PACEART REMOTE DEVICE CHECK
Battery Remaining Longevity: 128 mo
Battery Voltage: 3.04 V
Brady Statistic AS VS Percent: 61.5 %
Brady Statistic RA Percent Paced: 38.2 %
Brady Statistic RV Percent Paced: 0.75 %
Date Time Interrogation Session: 20180508174339
Implantable Lead Implant Date: 20171124
Implantable Lead Model: 5076
Implantable Pulse Generator Implant Date: 20171124
Lead Channel Impedance Value: 380 Ohm
Lead Channel Impedance Value: 513 Ohm
Lead Channel Pacing Threshold Amplitude: 0.5 V
Lead Channel Pacing Threshold Amplitude: 0.875 V
Lead Channel Pacing Threshold Pulse Width: 0.4 ms
Lead Channel Sensing Intrinsic Amplitude: 1.125 mV
Lead Channel Setting Pacing Pulse Width: 0.4 ms
Lead Channel Setting Sensing Sensitivity: 0.9 mV
MDC IDC LEAD IMPLANT DT: 20171124
MDC IDC LEAD LOCATION: 753859
MDC IDC LEAD LOCATION: 753860
MDC IDC MSMT LEADCHNL RA IMPEDANCE VALUE: 323 Ohm
MDC IDC MSMT LEADCHNL RA PACING THRESHOLD PULSEWIDTH: 0.4 ms
MDC IDC MSMT LEADCHNL RA SENSING INTR AMPL: 1.125 mV
MDC IDC MSMT LEADCHNL RV IMPEDANCE VALUE: 418 Ohm
MDC IDC MSMT LEADCHNL RV SENSING INTR AMPL: 11.75 mV
MDC IDC MSMT LEADCHNL RV SENSING INTR AMPL: 11.75 mV
MDC IDC SET LEADCHNL RA PACING AMPLITUDE: 2 V
MDC IDC SET LEADCHNL RV PACING AMPLITUDE: 2.5 V
MDC IDC STAT BRADY AP VP PERCENT: 0.67 %
MDC IDC STAT BRADY AP VS PERCENT: 37.74 %
MDC IDC STAT BRADY AS VP PERCENT: 0.1 %

## 2016-12-28 ENCOUNTER — Other Ambulatory Visit: Payer: Self-pay | Admitting: Internal Medicine

## 2017-01-12 ENCOUNTER — Other Ambulatory Visit: Payer: Self-pay | Admitting: Internal Medicine

## 2017-01-13 NOTE — Telephone Encounter (Signed)
Last OV was 07/18/16, next visit is 01/16/2017, last refill was 12/12/16 #60, no refills.  Please advise, thanks

## 2017-01-13 NOTE — Telephone Encounter (Signed)
Refill authorized.

## 2017-01-14 NOTE — Telephone Encounter (Signed)
Rx faxed to pharmacy  

## 2017-01-16 ENCOUNTER — Encounter: Payer: Self-pay | Admitting: Internal Medicine

## 2017-01-16 ENCOUNTER — Ambulatory Visit: Payer: 59 | Admitting: Internal Medicine

## 2017-01-16 ENCOUNTER — Ambulatory Visit (INDEPENDENT_AMBULATORY_CARE_PROVIDER_SITE_OTHER): Payer: Medicare Other | Admitting: Internal Medicine

## 2017-01-16 VITALS — BP 152/86 | HR 72 | Temp 98.0°F | Resp 15 | Ht 62.0 in | Wt 143.6 lb

## 2017-01-16 DIAGNOSIS — C641 Malignant neoplasm of right kidney, except renal pelvis: Secondary | ICD-10-CM | POA: Diagnosis not present

## 2017-01-16 DIAGNOSIS — I10 Essential (primary) hypertension: Secondary | ICD-10-CM | POA: Diagnosis not present

## 2017-01-16 DIAGNOSIS — I495 Sick sinus syndrome: Secondary | ICD-10-CM

## 2017-01-16 DIAGNOSIS — R5383 Other fatigue: Secondary | ICD-10-CM | POA: Diagnosis not present

## 2017-01-16 DIAGNOSIS — E890 Postprocedural hypothyroidism: Secondary | ICD-10-CM | POA: Diagnosis not present

## 2017-01-16 LAB — CBC WITH DIFFERENTIAL/PLATELET
BASOS PCT: 0.4 % (ref 0.0–3.0)
Basophils Absolute: 0 10*3/uL (ref 0.0–0.1)
EOS PCT: 0.7 % (ref 0.0–5.0)
Eosinophils Absolute: 0 10*3/uL (ref 0.0–0.7)
HCT: 39.4 % (ref 36.0–46.0)
Hemoglobin: 13.1 g/dL (ref 12.0–15.0)
LYMPHS ABS: 1.2 10*3/uL (ref 0.7–4.0)
Lymphocytes Relative: 41.7 % (ref 12.0–46.0)
MCHC: 33.3 g/dL (ref 30.0–36.0)
MCV: 91 fl (ref 78.0–100.0)
MONO ABS: 0.9 10*3/uL (ref 0.1–1.0)
Monocytes Relative: 32.9 % — ABNORMAL HIGH (ref 3.0–12.0)
NEUTROS ABS: 0.7 10*3/uL — AB (ref 1.4–7.7)
NEUTROS PCT: 24.3 % — AB (ref 43.0–77.0)
Platelets: 107 10*3/uL — ABNORMAL LOW (ref 150.0–400.0)
RBC: 4.33 Mil/uL (ref 3.87–5.11)
RDW: 17.2 % — AB (ref 11.5–15.5)
WBC: 2.8 10*3/uL — ABNORMAL LOW (ref 4.0–10.5)

## 2017-01-16 LAB — TSH: TSH: 1.15 u[IU]/mL (ref 0.35–4.50)

## 2017-01-16 LAB — COMPREHENSIVE METABOLIC PANEL
ALK PHOS: 74 U/L (ref 39–117)
ALT: 21 U/L (ref 0–35)
AST: 39 U/L — ABNORMAL HIGH (ref 0–37)
Albumin: 4.5 g/dL (ref 3.5–5.2)
BUN: 12 mg/dL (ref 6–23)
CALCIUM: 10.1 mg/dL (ref 8.4–10.5)
CO2: 28 meq/L (ref 19–32)
Chloride: 105 mEq/L (ref 96–112)
Creatinine, Ser: 0.79 mg/dL (ref 0.40–1.20)
GFR: 91.12 mL/min (ref >60.00)
Glucose, Bld: 81 mg/dL (ref 70–99)
POTASSIUM: 4.2 meq/L (ref 3.5–5.1)
Sodium: 141 mEq/L (ref 135–145)
Total Bilirubin: 0.4 mg/dL (ref 0.2–1.2)
Total Protein: 8.2 g/dL (ref 6.0–8.3)

## 2017-01-16 LAB — VITAMIN B12: Vitamin B-12: 789 pg/mL (ref 211–911)

## 2017-01-16 MED ORDER — CARVEDILOL 3.125 MG PO TABS: 3.1250 mg | ORAL_TABLET | Freq: Two times a day (BID) | ORAL | 3 refills | Status: DC

## 2017-01-16 NOTE — Assessment & Plan Note (Signed)
S/p right radical nephrectomy Nov 2017

## 2017-01-16 NOTE — Assessment & Plan Note (Signed)
Thyroid function is WNL on current dose.  No current changes needed.   Lab Results  Component Value Date   TSH 1.15 01/16/2017

## 2017-01-16 NOTE — Assessment & Plan Note (Signed)
S/p pacemaker implantation

## 2017-01-16 NOTE — Progress Notes (Signed)
Subjective:  Patient ID: Caroline Nichols, female    DOB: [birth year]  Age: 76 y.o. MRN: [id number]  CC: The primary encounter diagnosis was Fatigue, unspecified type. Diagnoses of Essential hypertension, Sinus node dysfunction (HCC), Postablative hypothyroidism, and Renal cell carcinoma of right kidney [facility]) were also pertinent to this visit.  HPI JENIN BIRDSALL presents for follow up on chronic conditions including GAD, hypertension,    Cc: fatigue ,  Tires easily  Diet reviewed:  Fruit ,  2 egg whites and toast .  Lunch: salad,  protien and sweet potatoe.  Dinner: sometimes banana sandwich bc she doesn't like the choices at [facility] seen in December BP was very high in march  During colonoscopy, s till above goal today .  Has been taking myrbetriq for nocturia, and it has reduced her nighttime voids.  .   Colonoscopy march  Benign  polyps  (hyperplastic)   Recent diagnosis of renal cell CA s/p right nephrectomy  March 2017  Pacemaker implanted nov 2017 seeing [facility] for follow up  Farrel Conners departing,     Outpatient Medications Prior to Visit  Medication Sig Dispense Refill  . acetaminophen (TYLENOL) 325 MG tablet Take 2 tablets (650 mg total) by mouth every 6 (six) hours as needed for pain.    Marland Kitchen ALPRAZolam (XANAX) 0.25 MG tablet TAKE ONE TABLET BY MOUTH TWO TIMES A DAY AS NEEDED FOR ANXIETY 60 tablet 0  . amLODipine (NORVASC) 10 MG tablet Take by mouth.    . brimonidine (ALPHAGAN) 0.2 % ophthalmic solution Place 1 drop into both eyes 2 (two) times daily.    . Calcium-Vitamin D (SUPER CALCIUM/D) 600-125 MG-UNIT TABS Take 2 tablets by mouth daily.     . Cholecalciferol (D3-1000 PO) Take 1,000 Units by mouth daily. Reported on 07/28/2015    . ferrous sulfate 325 (65 FE) MG tablet Take 325 mg by mouth daily with breakfast.    . fluticasone (FLONASE) 50 MCG/ACT nasal spray Place 2 sprays into both nostrils daily. (Patient taking differently: Place 2 sprays into both nostrils daily as  needed for allergies. ) 16 g 6  . folic acid (FOLVITE) 1 MG tablet Take by mouth.    . gabapentin (NEURONTIN) 100 MG capsule TAKE 1 CAPSULE (100 MG TOTAL) BY MOUTH 3 (THREE) TIMES DAILY. 90 capsule 1  . gabapentin (NEURONTIN) 300 MG capsule Take 1 capsule (300 mg total) by mouth 3 (three) times daily. As needed for shingles pain 90 capsule 3  . hydrALAZINE (APRESOLINE) 10 MG tablet Take by mouth.    . hydroxychloroquine (PLAQUENIL) 200 MG tablet Take 400 mg by mouth daily.     Marland Kitchen latanoprost (XALATAN) 0.005 % ophthalmic solution Place 1 drop into both eyes at bedtime.     Marland Kitchen levothyroxine (SYNTHROID, LEVOTHROID) 112 MCG tablet TAKE 1 TABLET  BY MOUTH DAILY BEFORE BREAKFAST. 90 tablet 0  . losartan (COZAAR) 100 MG tablet TAKE ONE TABLET BY MOUTH DAILY. NOTE DOSE CHANGED TO 100MG !! 90 tablet 1  . mirabegron ER (MYRBETRIQ) 50 MG TB24 tablet Take 1 tablet (50 mg total) by mouth daily. (Patient taking differently: Take 50 mg by mouth every evening. ) 30 tablet 2  . mirtazapine (REMERON) 7.5 MG tablet TAKE 1 TABLET (7.5 MG TOTAL) BY MOUTH AT BEDTIME. 30 tablet 5  . nitroGLYCERIN (NITROLINGUAL) 0.4 MG/SPRAY spray Place 1 spray under the tongue every 5 (five) minutes x 3 doses as needed for chest pain. 4.9 g 0  .  Omega-3 Fatty Acids (FISH OIL) 1000 MG CAPS Take 1,000 mg by mouth daily.     . pantoprazole (PROTONIX) 40 MG tablet TAKE 1 TABLET (40 MG TOTAL) BY MOUTH DAILY. 90 tablet 3  . vitamin C (ASCORBIC ACID) 500 MG tablet Take 500 mg by mouth daily.    Marland Kitchen ALPRAZolam (XANAX) 0.5 MG tablet Take by mouth.    . DOCOSAHEXAENOIC ACID PO Take by mouth.    Marland Kitchen tiZANidine (ZANAFLEX) 4 MG tablet Take by mouth.     No facility-administered medications prior to visit.     Review of Systems;  Patient denies headache, fevers, malaise, unintentional weight loss, skin rash, eye pain, sinus congestion and sinus pain, sore throat, dysphagia,  hemoptysis , cough, dyspnea, wheezing, chest pain, palpitations, orthopnea,  edema, abdominal pain, nausea, melena, diarrhea, constipation, flank pain, dysuria, hematuria, urinary  Frequency, nocturia, numbness, tingling, seizures,  Focal weakness, Loss of consciousness,  Tremor, insomnia, depression, anxiety, and suicidal ideation.      Objective:  BP (!) 152/86 (BP Location: Left Arm, Patient Position: Sitting, Cuff Size: Normal)   Pulse 72   Temp 98 F (36.7 C) (Oral)   Resp 15   Ht 5\' 2"  (1.575 m)   Wt 143 lb 9.6 oz (65.1 kg)   SpO2 95%   BMI 26.26 kg/m   BP Readings from Last 3 Encounters:  01/16/17 (!) 152/86  10/15/16 (!) 207/103  09/24/16 121/77    Wt Readings from Last 3 Encounters:  01/16/17 143 lb 9.6 oz (65.1 kg)  10/15/16 138 lb (62.6 kg)  09/24/16 140 lb 3.4 oz (63.6 kg)    General appearance: alert, cooperative and appears stated age Ears: normal TM's and external ear canals both ears Throat: lips, mucosa, and tongue normal; teeth and gums normal Neck: no adenopathy, no carotid bruit, supple, symmetrical, trachea midline and thyroid not enlarged, symmetric, no tenderness/mass/nodules Back: symmetric, no curvature. ROM normal. No CVA tenderness. Lungs: clear to auscultation bilaterally Heart: regular rate and rhythm, S1, S2 normal, no murmur, click, rub or gallop Abdomen: soft, non-tender; bowel sounds normal; no masses,  no organomegaly Pulses: 2+ and symmetric Skin: Skin color, texture, turgor normal. No rashes or lesions Lymph nodes: Cervical, supraclavicular, and axillary nodes normal.  Lab Results  Component Value Date   HGBA1C 5.3 06/09/2014   HGBA1C 5.2 12/27/2012    Lab Results  Component Value Date   CREATININE 0.79 01/16/2017   CREATININE 0.78 06/21/2016   CREATININE 0.90 06/04/2016    Lab Results  Component Value Date   WBC 2.8 (L) 01/16/2017   HGB 13.1 01/16/2017   HCT 39.4 01/16/2017   PLT 107.0 (L) 01/16/2017   GLUCOSE 81 01/16/2017   CHOL 167 01/11/2015   TRIG 72.0 01/11/2015   HDL 54.50 01/11/2015    LDLCALC 98 01/11/2015   ALT 21 01/16/2017   AST 39 (H) 01/16/2017   NA 141 01/16/2017   K 4.2 01/16/2017   CL 105 01/16/2017   CREATININE 0.79 01/16/2017   BUN 12 01/16/2017   CO2 28 01/16/2017   TSH 1.15 01/16/2017   INR 1.02 06/21/2016   HGBA1C 5.3 06/09/2014   MICROALBUR 0.7 06/09/2014    No results found.  Assessment & Plan:   Problem List Items Addressed This Visit    Sinus node dysfunction ([place])    S/p pacemaker implantation       Relevant Medications   carvedilol (COREG) 3.125 MG tablet   Renal cell carcinoma of right kidney ([place])  S/p right radical nephrectomy Nov 2017       Postablative hypothyroidism    Thyroid function is WNL on current dose.  No current changes needed.   Lab Results  Component Value Date   TSH 1.15 01/16/2017              Relevant Medications   carvedilol (COREG) 3.125 MG tablet   Fatigue - Primary    Screening labs normal.  No history of snoring.  Reviewed the events of the past 6 months to a year including nephrectomy, colonoscopy and and pacemaker implantation commended participating in regular walking  program   Lab Results  Component Value Date   VITAMINB12 789 01/16/2017   Lab Results  Component Value Date   TSH 1.15 01/16/2017   Lab Results  Component Value Date   WBC 2.8 (L) 01/16/2017   HGB 13.1 01/16/2017   HCT 39.4 01/16/2017   MCV 91.0 01/16/2017   PLT 107.0 (L) 01/16/2017   Lab Results  Component Value Date   CREATININE 0.79 01/16/2017         Relevant Orders   B12 (Completed)   Methylmalonic Acid, Serum   CBC with Differential/Platelet (Completed)   TSH (Completed)   Comprehensive metabolic panel (Completed)   Essential hypertension    Adding low dose carvedilol  Today for goal 130/80  Lab Results  Component Value Date   CREATININE 0.79 01/16/2017   Lab Results  Component Value Date   NA 141 01/16/2017   K 4.2 01/16/2017   CL 105 01/16/2017   CO2 28 01/16/2017         Relevant  Medications   carvedilol (COREG) 3.125 MG tablet    A total of 25 minutes of face to face time was spent with patient more than half of which was spent in counselling about the above mentioned conditions  and coordination of care   I have discontinued Ms. Poth's DOCOSAHEXAENOIC ACID PO and tiZANidine. I am also having her start on carvedilol. Additionally, I am having her maintain her Fish Oil, Calcium-Vitamin D, acetaminophen, vitamin C, ferrous sulfate, Cholecalciferol (D3-1000 PO), brimonidine, fluticasone, mirabegron ER, hydroxychloroquine, latanoprost, nitroGLYCERIN, gabapentin, pantoprazole, mirtazapine, amLODipine, folic acid, hydrALAZINE, losartan, levothyroxine, gabapentin, and ALPRAZolam.  Meds ordered this encounter  Medications  . carvedilol (COREG) 3.125 MG tablet    Sig: Take 1 tablet (3.125 mg total) by mouth 2 (two) times daily with a meal.    Dispense:  60 tablet    Refill:  3    Medications Discontinued During This Encounter  Medication Reason  . ALPRAZolam (XANAX) 0.5 MG tablet Patient has not taken in last 30 days  . DOCOSAHEXAENOIC ACID PO Patient has not taken in last 30 days  . tiZANidine (ZANAFLEX) 4 MG tablet Patient has not taken in last 30 days    Follow-up: Return in about 2 weeks (around 01/30/2017), or RN BP check,  6 months with me .   [name], MD

## 2017-01-16 NOTE — Patient Instructions (Addendum)
   I am adding a third medication for blood pressure,  Called carvedilol ,  Taken twice daily .  Continue amlodipine and losartan  Goal is 130/80    I will schedule an RN visit with Caroline Nichols in a week or so ; bring your home blood pressure monitor  .  You need more protein in the evenings when you skip the dinner!  Use the premier protein at night if you have soup or sandwich   If your B12 level is low , or if you get a big boost of energy from the shot,  We can continue the injections here or at home

## 2017-01-16 NOTE — Assessment & Plan Note (Signed)
Adding low dose carvedilol  Today for goal 130/80  Lab Results  Component Value Date   CREATININE 0.79 01/16/2017   Lab Results  Component Value Date   NA 141 01/16/2017   K 4.2 01/16/2017   CL 105 01/16/2017   CO2 28 01/16/2017

## 2017-01-16 NOTE — Assessment & Plan Note (Signed)
Screening labs normal.  No history of snoring.  Reviewed the events of the past 6 months to a year including nephrectomy, colonoscopy and and pacemaker implantation commended participating in regular walking  program   Lab Results  Component Value Date   VITAMINB12 789 01/16/2017   Lab Results  Component Value Date   TSH 1.15 01/16/2017   Lab Results  Component Value Date   WBC 2.8 (L) 01/16/2017   HGB 13.1 01/16/2017   HCT 39.4 01/16/2017   MCV 91.0 01/16/2017   PLT 107.0 (L) 01/16/2017   Lab Results  Component Value Date   CREATININE 0.79 01/16/2017

## 2017-01-17 ENCOUNTER — Encounter: Payer: Self-pay | Admitting: *Deleted

## 2017-01-20 LAB — METHYLMALONIC ACID, SERUM: METHYLMALONIC ACID, QUANT: 206 nmol/L (ref 87–318)

## 2017-01-20 NOTE — Progress Notes (Signed)
Cardiology Office Note  Date:  01/21/2017   ID:  Caroline Nichols, DOB 1940-09-29, MRN [id number]  PCP:  [name], MD   Chief Complaint  Patient presents with  . other    6 month follow up. Meds reviewed by the pt. verbally. "doing well."     HPI:  Caroline Nichols is a 76 y.o. female long history of  Pancytopenia, Renal cell carcinoma of right kidney, Right partial nephrectomy 10/2015 Plaquenil for her rheumatoid arthritis sinus node dysfunction, pacer November 2017, postop hematoma Stress Myoview August 2017 with no ischemia Coronary artery disease and aortic atherosclerosis on CT scan History of smoking,  IBS,  hypertension,  anemia with prior blood transfusions ,  hospitalized at [facility] from 02/22/13 to 02/24/13 for severe anemia, transfusion x 3 units PRBCs. GI w/u with endoscopy and colonoscopy. EGD was negative;   colonoscopy showed rectal tear s/p clips and resolution of problem She presents for routine follow-up of her sick sinus syndrome, hypertension  Long discussion with her concerning history over the past several years Kidney surgery for renal cell carcinoma March 2017, difficult recovery Feel she had a reaction to pain medication, morphine  CT scan of abdomen reviewed with her in detail, images pulled up She has moderate aortic atherosclerosis particularly at the distal aorta and iliac bifurcation Moderate proximal LAD calcification noted Currently not on a statin, reports she had a problem on Lipitor, muscle ache  Not very active, limited by her arthritis  Denies having any significant chest pain  Previously in the hospital March 09 2013 for chest pain.   CT scan of the chest for elevated d-dimer and pleuritic chest pain. No PE noted   may 2014, admission for cellulitis requiring long course of antibiotic  Previously with poor sleeping habits. Chronic fatigue  No PND, orthopnea, edema palpitations. She does not complain of claudication.   EKG personally  reviewed by myself on todays visit Shows  atrial paced rhythm 60 bpm    PMH:   has a past medical history of Anemia (02/2013); Anxiety; Arthritis; Cellulitis of arm, right; Chronic kidney disease; Diabetes insipidus ([place]); GERD (gastroesophageal reflux disease); H/O seasonal allergies; H/O transfusion of packed red blood cells (02/2013); Herniated disc; History of kidney stones; Hyperlipidemia; Hypertension; Hyperthyroidism; IBS (irritable bowel syndrome); Migraine; Pacemaker; Pancreatic cyst; Paroxysmal A-fib ([place]); and Renal cell cancer, right ([place]) (10/23/2015).  PSH:    Past Surgical History:  Procedure Laterality Date  . ABDOMINAL HYSTERECTOMY    . BREAST EXCISIONAL BIOPSY Right 2005   neg  . BREAST SURGERY  1990s   Biopsy  . CERVICAL SPINE SURGERY     Bone fusion  . COLONOSCOPY  2008?   [place]  . COLONOSCOPY WITH PROPOFOL N/A 10/15/2016   Procedure: COLONOSCOPY WITH PROPOFOL;  Surgeon: Robert Bellow, MD;  Location: [facility] ENDOSCOPY;  Service: Endoscopy;  Laterality: N/A;  . EP IMPLANTABLE DEVICE N/A 06/28/2016   Procedure: Pacemaker Implant;  Surgeon: Deboraha Sprang, MD;  Location: [place] CV LAB;  Service: Cardiovascular;  Laterality: N/A;  . HAMMER TOE SURGERY    . HERNIA REPAIR Left    Inguinal Hernia Repair  . ROBOTIC ASSITED PARTIAL NEPHRECTOMY Right 10/23/2015   Procedure: ROBOTIC ASSITED PARTIAL NEPHRECTOMY with intraop ultrasound;  Surgeon: Hollice Espy, MD;  Location: [facility] ORS;  Service: Urology;  Laterality: Right;  . TUBAL LIGATION      Current Outpatient Prescriptions  Medication Sig Dispense Refill  . acetaminophen (TYLENOL) 325 MG tablet Take  2 tablets (650 mg total) by mouth every 6 (six) hours as needed for pain.    Marland Kitchen ALPRAZolam (XANAX) 0.25 MG tablet TAKE ONE TABLET BY MOUTH TWO TIMES A DAY AS NEEDED FOR ANXIETY 60 tablet 0  . amLODipine (NORVASC) 10 MG tablet Take by mouth.    . brimonidine (ALPHAGAN) 0.2 % ophthalmic solution Place 1 drop into  both eyes 2 (two) times daily.    . Calcium-Vitamin D (SUPER CALCIUM/D) 600-125 MG-UNIT TABS Take 2 tablets by mouth daily.     . carvedilol (COREG) 3.125 MG tablet Take 1 tablet (3.125 mg total) by mouth 2 (two) times daily with a meal. 60 tablet 3  . Cholecalciferol (D3-1000 PO) Take 1,000 Units by mouth daily. Reported on 07/28/2015    . ferrous sulfate 325 (65 FE) MG tablet Take 325 mg by mouth daily with breakfast.    . fluticasone (FLONASE) 50 MCG/ACT nasal spray Place 2 sprays into both nostrils daily. (Patient taking differently: Place 2 sprays into both nostrils daily as needed for allergies. ) 16 g 6  . folic acid (FOLVITE) 1 MG tablet Take by mouth.    . gabapentin (NEURONTIN) 100 MG capsule TAKE 1 CAPSULE (100 MG TOTAL) BY MOUTH 3 (THREE) TIMES DAILY. 90 capsule 1  . gabapentin (NEURONTIN) 300 MG capsule Take 1 capsule (300 mg total) by mouth 3 (three) times daily. As needed for shingles pain 90 capsule 3  . hydrALAZINE (APRESOLINE) 10 MG tablet Take by mouth.    . hydroxychloroquine (PLAQUENIL) 200 MG tablet Take 400 mg by mouth daily.     Marland Kitchen latanoprost (XALATAN) 0.005 % ophthalmic solution Place 1 drop into both eyes at bedtime.     Marland Kitchen levothyroxine (SYNTHROID, LEVOTHROID) 112 MCG tablet TAKE 1 TABLET  BY MOUTH DAILY BEFORE BREAKFAST. 90 tablet 0  . losartan (COZAAR) 100 MG tablet TAKE ONE TABLET BY MOUTH DAILY. NOTE DOSE CHANGED TO 100MG !! 90 tablet 1  . mirabegron ER (MYRBETRIQ) 50 MG TB24 tablet Take 1 tablet (50 mg total) by mouth daily. (Patient taking differently: Take 50 mg by mouth every evening. ) 30 tablet 2  . mirtazapine (REMERON) 7.5 MG tablet TAKE 1 TABLET (7.5 MG TOTAL) BY MOUTH AT BEDTIME. 30 tablet 5  . nitroGLYCERIN (NITROLINGUAL) 0.4 MG/SPRAY spray Place 1 spray under the tongue every 5 (five) minutes x 3 doses as needed for chest pain. 4.9 g 0  . Omega-3 Fatty Acids (FISH OIL) 1000 MG CAPS Take 1,000 mg by mouth daily.     . pantoprazole (PROTONIX) 40 MG tablet TAKE  1 TABLET (40 MG TOTAL) BY MOUTH DAILY. 90 tablet 3  . vitamin C (ASCORBIC ACID) 500 MG tablet Take 500 mg by mouth daily.    . rosuvastatin (CRESTOR) 5 MG tablet Take 1 tablet (5 mg total) by mouth daily. 90 tablet 3   No current facility-administered medications for this visit.      Allergies:   Iodides; Adhesive [tape]; Codeine; Morphine and related; Tetanus toxoid; and Tramadol   Social History:  The patient  reports that she quit smoking about 4 years ago. Her smoking use included Cigarettes. She has a 15.00 pack-year smoking history. She has never used smokeless tobacco. She reports that she does not drink alcohol or use drugs.   Family History:   family history includes Breast cancer in her cousin; Diabetes in her brother; Heart disease in her father and mother; Kidney disease in her brother; Stroke in her daughter.  Review of Systems: Review of Systems  Constitutional: Negative.   Respiratory: Negative.   Cardiovascular: Negative.   Gastrointestinal: Negative.   Musculoskeletal: Negative.   Neurological: Negative.   Psychiatric/Behavioral: Negative.   All other systems reviewed and are negative.    PHYSICAL EXAM: VS:  BP 140/80 (BP Location: Left Arm, Patient Position: Sitting, Cuff Size: Normal)   Pulse 60   Ht 5\' 2"  (1.575 m)   Wt 142 lb 8 oz (64.6 kg)   BMI 26.06 kg/m  , BMI Body mass index is 26.06 kg/m. GEN: Well nourished, well developed, in no acute distress  HEENT: normal  Neck: no JVD, carotid bruits, or masses Cardiac: RRR; no murmurs, rubs, or gallops,no edema  Respiratory:  clear to auscultation bilaterally, normal work of breathing GI: soft, nontender, nondistended, + BS MS: no deformity or atrophy  Skin: warm and dry, no rash Neuro:  Strength and sensation are intact Psych: euthymic mood, full affect    Recent Labs: 01/16/2017: ALT 21; BUN 12; Creatinine, Ser 0.79; Hemoglobin 13.1; Platelets 107.0; Potassium 4.2; Sodium 141; TSH 1.15    Lipid  Panel Lab Results  Component Value Date   CHOL 167 01/11/2015   HDL 54.50 01/11/2015   LDLCALC 98 01/11/2015   TRIG 72.0 01/11/2015      Wt Readings from Last 3 Encounters:  01/21/17 142 lb 8 oz (64.6 kg)  01/16/17 143 lb 9.6 oz (65.1 kg)  10/15/16 138 lb (62.6 kg)       ASSESSMENT AND PLAN:  Atherosclerotic peripheral vascular disease with intermittent claudication ([place]) - Plan: EKG 12-Lead Significant atherosclerosis coronary and aortic seen on CT scan October 2017 Long discussion with her, recommended she start Crestor 5 mg daily, goal LDL less than 70 She has follow-up appointment with Dr. Derrel Nip She did have myalgias on Lipitor long discussion concerning anginal symptoms to watch for Stress Myoview August 2017 with no ischemia  Sinus node dysfunction ([place]) - Plan: EKG 12-Lead Pacemaker placement by Dr. Caryl Comes   rheumatoid arthritis   limited mobility  History of renal carcinoma, status post nephrectomy Reports that she has done well since that time  Hypertension Blood pressure is well controlled on today's visit. No changes made to the medications.    Disposition:   F/U 12 months   Orders Placed This Encounter  Procedures  . EKG 12-Lead     Signed, Esmond Plants, M.D., Ph.D. 01/21/2017  [place], [place]

## 2017-01-21 ENCOUNTER — Ambulatory Visit (INDEPENDENT_AMBULATORY_CARE_PROVIDER_SITE_OTHER): Payer: Medicare Other | Admitting: Cardiovascular Disease

## 2017-01-21 ENCOUNTER — Encounter: Payer: Self-pay | Admitting: Cardiovascular Disease

## 2017-01-21 VITALS — BP 140/80 | HR 60 | Ht 62.0 in | Wt 142.5 lb

## 2017-01-21 DIAGNOSIS — I25118 Atherosclerotic heart disease of native coronary artery with other forms of angina pectoris: Secondary | ICD-10-CM | POA: Diagnosis not present

## 2017-01-21 DIAGNOSIS — I70219 Atherosclerosis of native arteries of extremities with intermittent claudication, unspecified extremity: Secondary | ICD-10-CM

## 2017-01-21 DIAGNOSIS — Z950 Presence of cardiac pacemaker: Secondary | ICD-10-CM

## 2017-01-21 DIAGNOSIS — I209 Angina pectoris, unspecified: Secondary | ICD-10-CM

## 2017-01-21 DIAGNOSIS — M069 Rheumatoid arthritis, unspecified: Secondary | ICD-10-CM

## 2017-01-21 DIAGNOSIS — I4891 Unspecified atrial fibrillation: Secondary | ICD-10-CM

## 2017-01-21 DIAGNOSIS — I495 Sick sinus syndrome: Secondary | ICD-10-CM | POA: Diagnosis not present

## 2017-01-21 MED ORDER — ROSUVASTATIN CALCIUM 5 MG PO TABS: 5.0000 mg | ORAL_TABLET | Freq: Every day | ORAL | 3 refills | Status: DC

## 2017-01-21 NOTE — Patient Instructions (Addendum)
Medication Instructions:   Please start crestor 5 mg daily for cholesterol  Labwork:  No new labs needed  Testing/Procedures:  No further testing at this time   Follow-Up: It was a pleasure seeing you in the office today. Please call us if you have new issues that need to be addressed before your next appt.  [phone]  Your physician wants you to follow-up in: 6 months.  You will receive a reminder letter in the mail two months in advance. If you don't receive a letter, please call our office to schedule the follow-up appointment.  If you need a refill on your cardiac medications before your next appointment, please call your pharmacy.

## 2017-01-30 ENCOUNTER — Encounter: Payer: Self-pay | Admitting: *Deleted

## 2017-01-30 ENCOUNTER — Ambulatory Visit (INDEPENDENT_AMBULATORY_CARE_PROVIDER_SITE_OTHER): Payer: Medicare Other | Admitting: *Deleted

## 2017-01-30 VITALS — BP 130/80 | HR 60 | Resp 16

## 2017-01-30 DIAGNOSIS — I10 Essential (primary) hypertension: Secondary | ICD-10-CM | POA: Diagnosis not present

## 2017-01-30 NOTE — Progress Notes (Signed)
Patient presented for BP check two weeks post visit on 01/16/17 patient BP in lef arm 128/74 pulse 60 , right arm 130/80 pulse 60, patient brought in home cuff which read with 8 points of nurse reading. Patient also wanted to advise PCP that the B 12 injection received on 01/16/17 helped her fatigue greatly.

## 2017-02-12 ENCOUNTER — Other Ambulatory Visit: Payer: Self-pay | Admitting: Internal Medicine

## 2017-02-13 ENCOUNTER — Telehealth: Payer: Self-pay | Admitting: Urology

## 2017-02-13 NOTE — Telephone Encounter (Signed)
Returned call. No answer.  

## 2017-02-13 NOTE — Telephone Encounter (Signed)
Patient is calling and asking for more samples of Mybretriq, she said that [facility] told her to just call her whenever she needed more.   Sharyn Lull

## 2017-02-14 ENCOUNTER — Other Ambulatory Visit: Payer: Self-pay | Admitting: Internal Medicine

## 2017-02-14 NOTE — Telephone Encounter (Signed)
LMOM for patient to call office back.

## 2017-02-18 NOTE — Telephone Encounter (Signed)
Medication up front, patient notified

## 2017-02-27 ENCOUNTER — Ambulatory Visit (INDEPENDENT_AMBULATORY_CARE_PROVIDER_SITE_OTHER): Payer: Medicare Other

## 2017-02-27 DIAGNOSIS — E538 Deficiency of other specified B group vitamins: Secondary | ICD-10-CM | POA: Diagnosis not present

## 2017-02-27 MED ORDER — CYANOCOBALAMIN 1000 MCG/ML IJ SOLN
1000.0000 ug | Freq: Once | INTRAMUSCULAR | Status: AC
  Administered 2017-02-27: 1000 ug via INTRAMUSCULAR

## 2017-02-27 NOTE — Progress Notes (Signed)
Patient presents for B 12 injection.  Injected right deltoid patient tolerated injection well.

## 2017-03-06 ENCOUNTER — Other Ambulatory Visit: Payer: Self-pay | Admitting: Internal Medicine

## 2017-03-11 ENCOUNTER — Ambulatory Visit (INDEPENDENT_AMBULATORY_CARE_PROVIDER_SITE_OTHER): Payer: Medicare Other | Admitting: *Deleted

## 2017-03-11 DIAGNOSIS — I495 Sick sinus syndrome: Secondary | ICD-10-CM

## 2017-03-11 NOTE — Progress Notes (Signed)
Remote pacemaker transmission.   

## 2017-03-12 ENCOUNTER — Encounter: Payer: Self-pay | Admitting: Cardiology

## 2017-03-13 ENCOUNTER — Other Ambulatory Visit: Payer: Self-pay | Admitting: Internal Medicine

## 2017-03-13 NOTE — Telephone Encounter (Signed)
Does not look like this medication was ever prescribed by anyone in the office.   Last OV: 01/16/2017 Next OV: 07/18/2017

## 2017-03-16 NOTE — Telephone Encounter (Signed)
refilled 

## 2017-03-24 ENCOUNTER — Other Ambulatory Visit: Payer: Self-pay | Admitting: Internal Medicine

## 2017-03-27 DIAGNOSIS — H524 Presbyopia: Secondary | ICD-10-CM | POA: Diagnosis not present

## 2017-03-27 DIAGNOSIS — Z79899 Other long term (current) drug therapy: Secondary | ICD-10-CM | POA: Diagnosis not present

## 2017-03-27 DIAGNOSIS — H1850 Unspecified hereditary corneal dystrophies: Secondary | ICD-10-CM | POA: Diagnosis not present

## 2017-03-27 DIAGNOSIS — H47233 Glaucomatous optic atrophy, bilateral: Secondary | ICD-10-CM | POA: Diagnosis not present

## 2017-03-27 DIAGNOSIS — H52223 Regular astigmatism, bilateral: Secondary | ICD-10-CM | POA: Diagnosis not present

## 2017-03-27 DIAGNOSIS — H25013 Cortical age-related cataract, bilateral: Secondary | ICD-10-CM | POA: Diagnosis not present

## 2017-03-27 DIAGNOSIS — H401132 Primary open-angle glaucoma, bilateral, moderate stage: Secondary | ICD-10-CM | POA: Diagnosis not present

## 2017-03-27 DIAGNOSIS — H5203 Hypermetropia, bilateral: Secondary | ICD-10-CM | POA: Diagnosis not present

## 2017-03-27 DIAGNOSIS — I10 Essential (primary) hypertension: Secondary | ICD-10-CM | POA: Diagnosis not present

## 2017-03-27 DIAGNOSIS — M069 Rheumatoid arthritis, unspecified: Secondary | ICD-10-CM | POA: Diagnosis not present

## 2017-03-28 LAB — CUP PACEART REMOTE DEVICE CHECK
Battery Remaining Longevity: 116 mo
Brady Statistic AP VP Percent: 0.54 %
Brady Statistic AP VS Percent: 73.21 %
Brady Statistic AS VS Percent: 26.2 %
Brady Statistic RA Percent Paced: 73.37 %
Brady Statistic RV Percent Paced: 0.58 %
Date Time Interrogation Session: 20180807150754
Implantable Lead Implant Date: 20171124
Implantable Lead Location: 753859
Implantable Lead Model: 5076
Lead Channel Impedance Value: 418 Ohm
Lead Channel Pacing Threshold Amplitude: 0.875 V
Lead Channel Sensing Intrinsic Amplitude: 0.75 mV
Lead Channel Sensing Intrinsic Amplitude: 0.75 mV
Lead Channel Sensing Intrinsic Amplitude: 15.125 mV
Lead Channel Setting Pacing Amplitude: 2.5 V
MDC IDC LEAD IMPLANT DT: 20171124
MDC IDC LEAD LOCATION: 753860
MDC IDC MSMT BATTERY VOLTAGE: 3.03 V
MDC IDC MSMT LEADCHNL RA IMPEDANCE VALUE: 361 Ohm
MDC IDC MSMT LEADCHNL RA PACING THRESHOLD AMPLITUDE: 0.75 V
MDC IDC MSMT LEADCHNL RA PACING THRESHOLD PULSEWIDTH: 0.4 ms
MDC IDC MSMT LEADCHNL RV IMPEDANCE VALUE: 437 Ohm
MDC IDC MSMT LEADCHNL RV IMPEDANCE VALUE: 513 Ohm
MDC IDC MSMT LEADCHNL RV PACING THRESHOLD PULSEWIDTH: 0.4 ms
MDC IDC MSMT LEADCHNL RV SENSING INTR AMPL: 15.125 mV
MDC IDC PG IMPLANT DT: 20171124
MDC IDC SET LEADCHNL RA PACING AMPLITUDE: 2 V
MDC IDC SET LEADCHNL RV PACING PULSEWIDTH: 0.4 ms
MDC IDC SET LEADCHNL RV SENSING SENSITIVITY: 0.9 mV
MDC IDC STAT BRADY AS VP PERCENT: 0.04 %

## 2017-04-01 ENCOUNTER — Ambulatory Visit (INDEPENDENT_AMBULATORY_CARE_PROVIDER_SITE_OTHER): Payer: Medicare Other

## 2017-04-01 DIAGNOSIS — E538 Deficiency of other specified B group vitamins: Secondary | ICD-10-CM

## 2017-04-01 MED ORDER — CYANOCOBALAMIN 1000 MCG/ML IJ SOLN
1000.0000 ug | Freq: Once | INTRAMUSCULAR | Status: AC
  Administered 2017-04-01: 1000 ug via INTRAMUSCULAR

## 2017-04-01 NOTE — Progress Notes (Addendum)
Patient comes in for B 12 injection.  Injected right deltoid.  Patient tolerated injection well.   Reviewed.  Dr Scott 

## 2017-04-14 ENCOUNTER — Ambulatory Visit (INDEPENDENT_AMBULATORY_CARE_PROVIDER_SITE_OTHER): Payer: Self-pay | Admitting: Vascular Surgery

## 2017-04-14 ENCOUNTER — Encounter (INDEPENDENT_AMBULATORY_CARE_PROVIDER_SITE_OTHER): Payer: Self-pay

## 2017-04-16 DIAGNOSIS — H5203 Hypermetropia, bilateral: Secondary | ICD-10-CM | POA: Diagnosis not present

## 2017-04-16 DIAGNOSIS — H401132 Primary open-angle glaucoma, bilateral, moderate stage: Secondary | ICD-10-CM | POA: Diagnosis not present

## 2017-04-16 DIAGNOSIS — H524 Presbyopia: Secondary | ICD-10-CM | POA: Diagnosis not present

## 2017-04-16 DIAGNOSIS — H47233 Glaucomatous optic atrophy, bilateral: Secondary | ICD-10-CM | POA: Diagnosis not present

## 2017-04-16 DIAGNOSIS — M069 Rheumatoid arthritis, unspecified: Secondary | ICD-10-CM | POA: Diagnosis not present

## 2017-04-16 DIAGNOSIS — H1850 Unspecified hereditary corneal dystrophies: Secondary | ICD-10-CM | POA: Diagnosis not present

## 2017-04-16 DIAGNOSIS — I10 Essential (primary) hypertension: Secondary | ICD-10-CM | POA: Diagnosis not present

## 2017-04-16 DIAGNOSIS — Z79899 Other long term (current) drug therapy: Secondary | ICD-10-CM | POA: Diagnosis not present

## 2017-04-16 DIAGNOSIS — H25013 Cortical age-related cataract, bilateral: Secondary | ICD-10-CM | POA: Diagnosis not present

## 2017-04-16 DIAGNOSIS — H52223 Regular astigmatism, bilateral: Secondary | ICD-10-CM | POA: Diagnosis not present

## 2017-04-20 ENCOUNTER — Other Ambulatory Visit: Payer: Self-pay | Admitting: Internal Medicine

## 2017-04-22 ENCOUNTER — Other Ambulatory Visit: Payer: Self-pay | Admitting: Internal Medicine

## 2017-04-22 DIAGNOSIS — Z1231 Encounter for screening mammogram for malignant neoplasm of breast: Secondary | ICD-10-CM

## 2017-04-24 ENCOUNTER — Telehealth: Payer: Self-pay | Admitting: Internal Medicine

## 2017-04-24 DIAGNOSIS — M0579 Rheumatoid arthritis with rheumatoid factor of multiple sites without organ or systems involvement: Secondary | ICD-10-CM | POA: Diagnosis not present

## 2017-04-24 DIAGNOSIS — G629 Polyneuropathy, unspecified: Secondary | ICD-10-CM | POA: Diagnosis not present

## 2017-04-24 DIAGNOSIS — R748 Abnormal levels of other serum enzymes: Secondary | ICD-10-CM | POA: Diagnosis not present

## 2017-04-24 DIAGNOSIS — D72819 Decreased white blood cell count, unspecified: Secondary | ICD-10-CM | POA: Diagnosis not present

## 2017-04-24 NOTE — Telephone Encounter (Signed)
Called pt. No answer, No vm. Calling to schedule AWV.  NOTE * Last AWV 07/18/16; please schedule 07/19/17 or after  Pt has follow up scheduled with PCP on 07/18/17 but that does not include AWV

## 2017-04-28 NOTE — Telephone Encounter (Signed)
Scheduled 07/22/17

## 2017-04-30 NOTE — Telephone Encounter (Signed)
Error

## 2017-05-01 DIAGNOSIS — Z85528 Personal history of other malignant neoplasm of kidney: Secondary | ICD-10-CM | POA: Diagnosis not present

## 2017-05-01 DIAGNOSIS — Z803 Family history of malignant neoplasm of breast: Secondary | ICD-10-CM | POA: Diagnosis not present

## 2017-05-01 DIAGNOSIS — Z8481 Family history of carrier of genetic disease: Secondary | ICD-10-CM | POA: Diagnosis not present

## 2017-05-01 DIAGNOSIS — Z8051 Family history of malignant neoplasm of kidney: Secondary | ICD-10-CM | POA: Diagnosis not present

## 2017-05-07 ENCOUNTER — Ambulatory Visit (INDEPENDENT_AMBULATORY_CARE_PROVIDER_SITE_OTHER): Payer: Medicare Other | Admitting: *Deleted

## 2017-05-07 DIAGNOSIS — Z23 Encounter for immunization: Secondary | ICD-10-CM

## 2017-05-07 DIAGNOSIS — E538 Deficiency of other specified B group vitamins: Secondary | ICD-10-CM | POA: Diagnosis not present

## 2017-05-07 MED ORDER — CYANOCOBALAMIN 1000 MCG/ML IJ SOLN
1000.0000 ug | Freq: Once | INTRAMUSCULAR | Status: AC
  Administered 2017-05-07: 1000 ug via INTRAMUSCULAR

## 2017-05-07 NOTE — Progress Notes (Addendum)
Patient presented for B 12 injection to left deltoid, patient voiced no concerns nor showed any signs of distress during injection   I have reviewed the above information and agree with above.   Teresa Tullo, MD 

## 2017-05-13 ENCOUNTER — Telehealth: Payer: Self-pay | Admitting: Urology

## 2017-05-13 ENCOUNTER — Other Ambulatory Visit: Payer: Self-pay | Admitting: Internal Medicine

## 2017-05-13 ENCOUNTER — Telehealth: Payer: Self-pay | Admitting: Cardiovascular Disease

## 2017-05-13 NOTE — Telephone Encounter (Signed)
PT calling about medication She has noticed some side effects from the CRESTOR medication She is having trouble with her legs, muscles are getting weaker Please call to advise

## 2017-05-13 NOTE — Telephone Encounter (Signed)
Pt called office stating that Vikki Ports gives her samples for mybetriq. Pt states she is getting low and wants to let [facility] know. Please advise.

## 2017-05-13 NOTE — Telephone Encounter (Signed)
Patient started on Crestor in June this year. Since then her legs are weaker and very achy. She can only walk a short distance before she has to stop and rest. This did not start happening until after she started back on the Crestor in June. She tried Crestor and Lipitor in past years and the same thing happened. Patient says her rheumatologist even said her legs seem weaker as well. Patient does not feel she can continue taking it. Advised patient to stop the Crestor at this time and see if it helps. Meanwhile I will route to Dr Rockey Situ for advice.

## 2017-05-14 NOTE — Telephone Encounter (Signed)
No answer

## 2017-05-15 ENCOUNTER — Other Ambulatory Visit: Payer: Self-pay | Admitting: Internal Medicine

## 2017-05-15 NOTE — Telephone Encounter (Signed)
Samples were provided to pt this morning.

## 2017-05-16 ENCOUNTER — Other Ambulatory Visit (INDEPENDENT_AMBULATORY_CARE_PROVIDER_SITE_OTHER): Payer: Self-pay | Admitting: Vascular Surgery

## 2017-05-16 DIAGNOSIS — M79606 Pain in leg, unspecified: Secondary | ICD-10-CM

## 2017-05-18 NOTE — Telephone Encounter (Signed)
Would stop crestor for now and let us know when legs get back to baseline

## 2017-05-20 ENCOUNTER — Other Ambulatory Visit: Payer: Self-pay | Admitting: Internal Medicine

## 2017-05-20 ENCOUNTER — Ambulatory Visit
Admission: RE | Admit: 2017-05-20 | Discharge: 2017-05-20 | Disposition: A | Discharge status: Home / Self Care | Payer: Medicare Other | Source: Ambulatory Visit | Attending: Internal Medicine | Admitting: Internal Medicine

## 2017-05-20 DIAGNOSIS — Z1231 Encounter for screening mammogram for malignant neoplasm of breast: Secondary | ICD-10-CM | POA: Diagnosis not present

## 2017-05-20 NOTE — Telephone Encounter (Signed)
Left voicemail message to call back  

## 2017-05-21 ENCOUNTER — Ambulatory Visit (INDEPENDENT_AMBULATORY_CARE_PROVIDER_SITE_OTHER): Payer: Medicare Other

## 2017-05-21 ENCOUNTER — Encounter (INDEPENDENT_AMBULATORY_CARE_PROVIDER_SITE_OTHER): Payer: Self-pay | Admitting: Vascular Surgery

## 2017-05-21 ENCOUNTER — Ambulatory Visit (INDEPENDENT_AMBULATORY_CARE_PROVIDER_SITE_OTHER): Payer: Medicare Other | Admitting: Vascular Surgery

## 2017-05-21 VITALS — BP 127/75 | HR 68 | Resp 16 | Ht 63.0 in | Wt 141.0 lb

## 2017-05-21 DIAGNOSIS — E232 Diabetes insipidus: Secondary | ICD-10-CM

## 2017-05-21 DIAGNOSIS — E785 Hyperlipidemia, unspecified: Secondary | ICD-10-CM | POA: Diagnosis not present

## 2017-05-21 DIAGNOSIS — I70219 Atherosclerosis of native arteries of extremities with intermittent claudication, unspecified extremity: Secondary | ICD-10-CM

## 2017-05-21 DIAGNOSIS — R600 Localized edema: Secondary | ICD-10-CM | POA: Diagnosis not present

## 2017-05-21 DIAGNOSIS — M79606 Pain in leg, unspecified: Secondary | ICD-10-CM

## 2017-05-21 NOTE — Progress Notes (Signed)
Subjective:    Patient ID: Caroline Nichols, female    DOB: August 30, 1940, 76 y.o.   MRN: [id number] Chief Complaint  Patient presents with   Follow-up    1 year ABI    Patient presents for a yearly peripheral artery disease follow-up. Patient presents today without complaint patient denies any claudication-like symptoms rest pain or ulceration to the lower extremity. Patient underwent a bilateral lower extremity ABI which was notable for right ankle brachial index suggesting moderate right lower extremity arterial occlusive disease. Right tibials biphasic with a monophasic anterior tibial. The left ankle-brachial index is just mild left lower extremity arterial occlusive disease. Left tibials are biphasic with a triphasic anterior tibial. Patient denies any fever, nausea or vomiting.   Review of Systems  Constitutional: Negative.   HENT: Negative.   Eyes: Negative.   Respiratory: Negative.   Cardiovascular: Negative.   Gastrointestinal: Negative.   Endocrine: Negative.   Genitourinary: Negative.   Musculoskeletal: Negative.   Skin: Negative.   Allergic/Immunologic: Negative.   Neurological: Negative.   Hematological: Negative.   Psychiatric/Behavioral: Negative.       Objective:   Physical Exam  Constitutional: She is oriented to person, place, and time. She appears well-developed and well-nourished. No distress.  HENT:  Head: Normocephalic and atraumatic.  Eyes: Pupils are equal, round, and reactive to light. Conjunctivae are normal.  Neck: Normal range of motion.  Cardiovascular: Normal rate, regular rhythm, normal heart sounds and intact distal pulses.   Pulses:      Radial pulses are 2+ on the right side, and 2+ on the left side.       Dorsalis pedis pulses are 1+ on the right side, and 2+ on the left side.       Posterior tibial pulses are 1+ on the right side, and 2+ on the left side.  Pulmonary/Chest: Effort normal.  Musculoskeletal: Normal range of motion. She exhibits  edema (mild bilateral lower extremity edema noted).  Neurological: She is alert and oriented to person, place, and time.  Skin: Skin is warm and dry. She is not diaphoretic.  Psychiatric: She has a normal mood and affect. Her behavior is normal. Judgment and thought content normal.  Vitals reviewed.   BP 127/75 (BP Location: Right Arm)    Pulse 68    Resp 16    Ht 5\' 3"  (1.6 m)    Wt 141 lb (64 kg)    BMI 24.98 kg/m   Past Medical History:  Diagnosis Date   Anemia 02/2013   F/U with Dr. Grayland Ormond for Feraheme injections   Anxiety    Arthritis    Possible RA - Follow up appt with Dr. Jefm Bryant   Cellulitis of arm, right    Chronic kidney disease    Diabetes insipidus ([place])    On desmopressin   GERD (gastroesophageal reflux disease)    H/O seasonal allergies    H/O transfusion of packed red blood cells 02/2013   3 units PRBC for severe anemia 02/2013   Herniated disc    History of kidney stones    Hyperlipidemia    Hypertension    Hyperthyroidism    s/p radiation therapy, now hypothyroidism   IBS (irritable bowel syndrome)    Migraine    Pacemaker    a. s/p successful implantation of a Medtronic CapSureFix Novus MRI SureScan serial # [id number] on 06/28/16 by Dr. Caryl Comes for sinus node dysfunction.   Pancreatic cyst    Paroxysmal A-fib ([facility])  Renal cell cancer, right ([place]) 10/23/2015   Right partial nephrectomy.    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  Brant was born and reared in [place]. She is a widow since 54. She was married for 48 years. They had 4 children (daughters). She is currently leaving at [employer] since June. 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 She enjoys shopping. She also enjoys music, word puzzles and she loves spending time with her family. She loves attending church. One of her favorite things is wearing hats. She absolutely loves hats!    Past Surgical History:  Procedure Laterality Date   ABDOMINAL HYSTERECTOMY     BREAST EXCISIONAL BIOPSY Right 2005   neg   BREAST SURGERY  1990s   Biopsy   CERVICAL SPINE SURGERY     Bone fusion   COLONOSCOPY  2008?   [place]   COLONOSCOPY WITH PROPOFOL N/A 10/15/2016   Procedure: COLONOSCOPY WITH PROPOFOL;  Surgeon: Robert Bellow, MD;  Location[facility] END[facility]COPY;  Service: Endoscopy;  Laterality: N/A;   EP IMPLANTABLE DEVICE N/A 06/28/2016   Procedure: Pacemaker Implant;  Surgeon: Deboraha Sprang, MD;  Location: [facility];  Service: Cardiovascular;  Laterality: N/A;   HAMMER TOE SURGERY     HERNIA REPAIR Left    Inguinal Hernia Repair   ROBOTIC ASSITED PARTIAL NEPHRECTOMY Right 10/23/2015   Procedure: ROBOTIC ASSITED PARTIAL NEPHRECTOMY with intraop ultrasound;  Surgeon: Hollice Espy, MD;  Location: [facility] ORS;  Service: Urology;  Laterality: Right;   TUBAL LIGATION      Family History  Problem Relation Age of Onset   Heart disease Mother    Heart disease Father    Diabetes Brother    Kidney disease Brother    Stroke Daughter        due to medication reaction   Breast cancer Cousin    Prostate cancer Neg Hx    Hematuria Neg Hx     Allergies  Allergen Reactions   Iodides     Other reaction(s): Unknown   Adhesive [Tape] Other (See Comments)    "irritate skin"   Codeine     REACTION: NAUSEA   Morphine And Related Other (See  Comments)    Shut down her organs   Tetanus Toxoid     REACTION: ARM SWELLING,REDNESS   Tramadol Nausea And Vomiting       Assessment & Plan:  Patient presents for a yearly peripheral artery disease follow-up. Patient presents today without complaint patient denies any claudication-like symptoms rest pain or ulceration to the lower extremity. Patient underwent a bilateral lower extremity ABI which was notable for right ankle brachial index suggesting moderate right lower extremity arterial occlusive disease. Right tibials  biphasic with a monophasic anterior tibial. The left ankle-brachial index is just mild left lower extremity arterial occlusive disease. Left tibials are biphasic with a triphasic anterior tibial. Patient denies any fever, nausea or vomiting.  1. Atherosclerotic peripheral vascular disease with intermittent claudication ([place]) - stable Patient presents today asymptomatic. Stable physical exam. No intervention indicated at this time. Patient to follow up on a yearly basis due to her risk factors for peripheral artery disease. Patient to call the office sooner if she should experience any new symptoms.  - VAS [place] ABI WITH/WO TBI; Future  2. Hyperlipidemia, unspecified hyperlipidemia type - Stable Encouraged good control as its slows the progression of atherosclerotic disease  3. Edema of both legs - stable The patient was encouraged to wear compression stockings on a daily basis. She was directed to place them on in the morning and take them off at night. It was encouraged to elevate her legs heart level or higher. She expresses her understanding  4. Diabetes insipidus ([place]) - stable Encouraged good control as its slows the progression of atherosclerotic disease   Current Outpatient Prescriptions on File Prior to Visit  Medication Sig Dispense Refill   acetaminophen (TYLENOL) 325 MG tablet Take 2 tablets (650 mg total) by mouth every 6 (six) hours as needed for pain.      ALPRAZolam (XANAX) 0.25 MG tablet TAKE ONE TABLET BY MOUTH TWICE A DAY AS NEEDED FOR ANXIETY 60 tablet 2   amLODipine (NORVASC) 10 MG tablet Take by mouth.     brimonidine (ALPHAGAN) 0.2 % ophthalmic solution Place 1 drop into both eyes 2 (two) times daily.     Calcium-Vitamin D (SUPER CALCIUM/D) 600-125 MG-UNIT TABS Take 2 tablets by mouth daily.      carvedilol (COREG) 3.125 MG tablet TAKE ONE TABLET BY MOUTH TWICE A DAY WITH FOOD 60 tablet 2   Cholecalciferol (D3-1000 PO) Take 1,000 Units by mouth daily. Reported on 07/28/2015     ferrous sulfate 325 (65 FE) MG tablet Take 325 mg by mouth daily with breakfast.     fluticasone (FLONASE) 50 MCG/ACT nasal spray Place 2 sprays into both nostrils daily. (Patient taking differently: Place 2 sprays into both nostrils daily as needed for allergies. ) 16 g 6   folic acid (FOLVITE) 1 MG tablet Take by mouth.     gabapentin (NEURONTIN) 100 MG capsule TAKE 1 CAPSULE (100 MG TOTAL) BY MOUTH 3 (THREE) TIMES DAILY. 90 capsule 1   gabapentin (NEURONTIN) 300 MG capsule TAKE 1 CAPSULE (300 MG TOTAL) BY MOUTH 3 (THREE) TIMES DAILY. AS NEEDED FOR SHINGLES PAIN 90 capsule 1   hydrALAZINE (APRESOLINE) 10 MG tablet Take by mouth.     hydroxychloroquine (PLAQUENIL) 200 MG tablet Take 400 mg by mouth daily.      latanoprost (XALATAN) 0.005 % ophthalmic solution Place 1 drop into both eyes at bedtime.      levothyroxine (SYNTHROID, LEVOTHROID) 112 MCG tablet TAKE 1 TABLET  BY MOUTH DAILY BEFORE BREAKFAST. 90 tablet 0   losartan (COZAAR) 100 MG tablet TAKE ONE TABLET BY MOUTH DAILY. NOTE DOSE CHANGED TO 100MG !! 90 tablet 1   mirabegron ER (MYRBETRIQ) 50 MG TB24 tablet Take 1 tablet (50 mg total) by mouth daily. (Patient taking differently: Take 50 mg by mouth every evening. ) 30 tablet 2   mirtazapine (REMERON) 7.5 MG tablet TAKE 1 TABLET (7.5 MG TOTAL) BY MOUTH AT BEDTIME. 30 tablet 4   nitroGLYCERIN (NITROLINGUAL) 0.4 MG/SPRAY spray Place 1 spray  under the tongue every 5 (five) minutes x 3 doses as needed for chest pain. 4.9 g 0   Omega-3 Fatty Acids (FISH OIL) 1000 MG CAPS Take 1,000 mg by mouth daily.      pantoprazole (PROTONIX) 40 MG tablet TAKE 1 TABLET (40 MG TOTAL) BY MOUTH DAILY. 90 tablet 3   rosuvastatin (CRESTOR) 5 MG tablet Take 1 tablet (5 mg total) by mouth daily. 90 tablet 3   vitamin C (ASCORBIC ACID) 500 MG tablet Take 500 mg by mouth daily.     No current facility-administered medications on file prior to visit.     There are no Patient Instructions on file for this visit. No Follow-up on file.   Giana Castner A Brandice Busser, PA-C

## 2017-05-21 NOTE — Telephone Encounter (Signed)
Patient states that she stopped her crestor after talking with Anderson Malta and the pain did go away. She reports that she had the same reaction to Lipitor previously as well. Let her know that I would make Dr. Rockey Situ aware. She had no further questions or concerns at this time.

## 2017-05-21 NOTE — Telephone Encounter (Signed)
Left voicemail message to call back  

## 2017-05-23 ENCOUNTER — Other Ambulatory Visit: Payer: Self-pay | Admitting: Internal Medicine

## 2017-05-23 NOTE — Telephone Encounter (Signed)
Would consider starting Zetia 10 mg daily This is not a statin When she has been on this for several weeks then would start Crestor 5 mg twice a week

## 2017-05-26 NOTE — Telephone Encounter (Signed)
Patient states that she is not taking any other cholesterol medications. Reviewed with her in detail that Zetia is not a statin and should not cause those same side effects but she continued to refuse stating that she just does not want to add anymore pills at this time. Advised her that I would make Dr. Rockey Situ aware and if she changes her mind and would like to consider trying this new one to let us know. She verbalized understanding and had no further questions at this time.

## 2017-05-26 NOTE — Telephone Encounter (Signed)
Left voicemail message to call back  

## 2017-06-01 ENCOUNTER — Other Ambulatory Visit: Payer: Self-pay | Admitting: Internal Medicine

## 2017-06-05 DIAGNOSIS — H401133 Primary open-angle glaucoma, bilateral, severe stage: Secondary | ICD-10-CM | POA: Diagnosis not present

## 2017-06-05 DIAGNOSIS — C3490 Malignant neoplasm of unspecified part of unspecified bronchus or lung: Secondary | ICD-10-CM

## 2017-06-05 HISTORY — DX: Malignant neoplasm of unspecified part of unspecified bronchus or lung: C34.90

## 2017-06-06 ENCOUNTER — Telehealth: Payer: Self-pay | Admitting: Urology

## 2017-06-06 ENCOUNTER — Ambulatory Visit
Admission: RE | Admit: 2017-06-06 | Discharge: 2017-06-06 | Disposition: A | Discharge status: Home / Self Care | Payer: Medicare Other | Source: Ambulatory Visit | Attending: Urology | Admitting: Urology

## 2017-06-06 DIAGNOSIS — N200 Calculus of kidney: Secondary | ICD-10-CM | POA: Diagnosis not present

## 2017-06-06 DIAGNOSIS — Z85528 Personal history of other malignant neoplasm of kidney: Secondary | ICD-10-CM | POA: Insufficient documentation

## 2017-06-06 DIAGNOSIS — Z905 Acquired absence of kidney: Secondary | ICD-10-CM | POA: Insufficient documentation

## 2017-06-06 DIAGNOSIS — R911 Solitary pulmonary nodule: Secondary | ICD-10-CM

## 2017-06-06 DIAGNOSIS — I700 Atherosclerosis of aorta: Secondary | ICD-10-CM | POA: Diagnosis not present

## 2017-06-06 LAB — POCT I-STAT CREATININE: Creatinine, Ser: 0.9 mg/dL (ref 0.44–1.00)

## 2017-06-06 MED ORDER — IOPAMIDOL (ISOVUE-300) INJECTION 61%
100.0000 mL | Freq: Once | INTRAVENOUS | Status: AC | PRN
  Administered 2017-06-06: 100 mL via INTRAVENOUS

## 2017-06-06 NOTE — Telephone Encounter (Signed)
LMOM

## 2017-06-06 NOTE — Telephone Encounter (Signed)
CT scan briefly reviewed today.  Small lung nodule which was incompletely imaged.  I think it is prudent to get a chest CT prior to her scheduled follow-up next week.  This is ordered.  Please let the patient know and like to get a chest CT in addition to her abdomen/j pelvic scan.  Please help arrange this prior to her follow-up with me.  Hollice Espy, MD

## 2017-06-06 NOTE — Telephone Encounter (Deleted)
Call report from Opal Sidles in radiology: Suspicion of a left lower lobe pulmonary nodule, only partially imaged. Consider dedicated chest CT.

## 2017-06-06 NOTE — Telephone Encounter (Signed)
This encounter was created in error - please disregard.

## 2017-06-06 NOTE — Telephone Encounter (Signed)
Spoke with patient and transferred her over to scheduling to get this scheduled   Caroline Nichols

## 2017-06-11 ENCOUNTER — Ambulatory Visit (INDEPENDENT_AMBULATORY_CARE_PROVIDER_SITE_OTHER): Payer: Medicare Other

## 2017-06-11 DIAGNOSIS — E538 Deficiency of other specified B group vitamins: Secondary | ICD-10-CM

## 2017-06-11 MED ORDER — CYANOCOBALAMIN 1000 MCG/ML IJ SOLN
1000.0000 ug | Freq: Once | INTRAMUSCULAR | Status: AC
  Administered 2017-06-11: 1000 ug via INTRAMUSCULAR

## 2017-06-11 NOTE — Progress Notes (Signed)
Patient comes in for B 12 injection.  Injected right  deltoid.  Patient tolerated injection well.    I have reviewed the above information and agree with above.   Deborra Medina, MD

## 2017-06-12 ENCOUNTER — Ambulatory Visit (INDEPENDENT_AMBULATORY_CARE_PROVIDER_SITE_OTHER): Payer: Medicare Other | Admitting: *Deleted

## 2017-06-12 ENCOUNTER — Ambulatory Visit
Admission: RE | Admit: 2017-06-12 | Discharge: 2017-06-12 | Disposition: A | Discharge status: Home / Self Care | Payer: Medicare Other | Source: Ambulatory Visit | Attending: Urology | Admitting: Urology

## 2017-06-12 DIAGNOSIS — I495 Sick sinus syndrome: Secondary | ICD-10-CM | POA: Diagnosis not present

## 2017-06-12 DIAGNOSIS — I700 Atherosclerosis of aorta: Secondary | ICD-10-CM | POA: Diagnosis not present

## 2017-06-12 DIAGNOSIS — I2510 Atherosclerotic heart disease of native coronary artery without angina pectoris: Secondary | ICD-10-CM | POA: Insufficient documentation

## 2017-06-12 DIAGNOSIS — J439 Emphysema, unspecified: Secondary | ICD-10-CM | POA: Diagnosis not present

## 2017-06-12 DIAGNOSIS — R590 Localized enlarged lymph nodes: Secondary | ICD-10-CM | POA: Insufficient documentation

## 2017-06-12 DIAGNOSIS — I2584 Coronary atherosclerosis due to calcified coronary lesion: Secondary | ICD-10-CM | POA: Diagnosis not present

## 2017-06-12 DIAGNOSIS — R911 Solitary pulmonary nodule: Secondary | ICD-10-CM | POA: Insufficient documentation

## 2017-06-12 LAB — POCT I-STAT CREATININE: CREATININE: 0.7 mg/dL (ref 0.44–1.00)

## 2017-06-12 MED ORDER — IOPAMIDOL (ISOVUE-300) INJECTION 61%
75.0000 mL | Freq: Once | INTRAVENOUS | Status: AC | PRN
  Administered 2017-06-12: 75 mL via INTRAVENOUS

## 2017-06-12 NOTE — Progress Notes (Signed)
Remote pacemaker transmission.   

## 2017-06-13 ENCOUNTER — Encounter: Payer: Self-pay | Admitting: Cardiology

## 2017-06-13 ENCOUNTER — Encounter: Payer: Self-pay | Admitting: Urology

## 2017-06-13 ENCOUNTER — Ambulatory Visit (INDEPENDENT_AMBULATORY_CARE_PROVIDER_SITE_OTHER): Payer: Medicare Other | Admitting: Urology

## 2017-06-13 VITALS — BP 133/74 | HR 77 | Ht 62.0 in | Wt 141.0 lb

## 2017-06-13 DIAGNOSIS — Z85528 Personal history of other malignant neoplasm of kidney: Secondary | ICD-10-CM

## 2017-06-13 DIAGNOSIS — N3281 Overactive bladder: Secondary | ICD-10-CM

## 2017-06-13 DIAGNOSIS — R911 Solitary pulmonary nodule: Secondary | ICD-10-CM | POA: Diagnosis not present

## 2017-06-13 DIAGNOSIS — I70219 Atherosclerosis of native arteries of extremities with intermittent claudication, unspecified extremity: Secondary | ICD-10-CM

## 2017-06-13 NOTE — Progress Notes (Signed)
06/13/2017 11:04 AM   Caroline Nichols [birth year] [id number]  Referring provider: [name], MD [place], [place] [id number]  Chief Complaint  Patient presents with   RCC    1year    HPI: 76 year old female status post right robotic partial nephrectomy on 10/23/2015.   Surgical pathology consistent with clear cell papillary renal cell carcinoma, negative margins, 2 cm, Fuhrman grade 2, negative margins. pT1aN0 M0.   Most recent surveillance CT abdomen pelvis with and without contrast on 05/06/2017 shows no evidence of local recurrence of her renal mass.  Unfortunately, there is suspicion for left lower lobe pulmonary nodule.  Follow-up chest CT confirmed the presence of a lobulated 2.6 cm pulmonary nodule with an enlarged left hilar lymph node concerning for metastasis.  This is not appreciated on previous chest x-ray imaging.  He has been followed by Dr. Grayland Nichols in the past for further evaluation of pancytopenia.   Since last visit, she has been struggling with eye issues.     She does have OAB on mybetriq 50 mg helpful for her urinary symptoms.  She continues to have some frequency but her urgency and urge incontinence is improved.  No flank pain or hematuria.    PMH: Past Medical History:  Diagnosis Date   Anemia 02/2013   F/U with Dr. Grayland Nichols for Feraheme injections   Anxiety    Arthritis    Possible RA - Follow up appt with Dr. Jefm Nichols   Cellulitis of arm, right    Chronic kidney disease    Diabetes insipidus ([place])    On desmopressin   GERD (gastroesophageal reflux disease)    H/O seasonal allergies    H/O transfusion of packed red blood cells 02/2013   3 units PRBC for severe anemia 02/2013   Herniated disc    History of kidney stones    Hyperlipidemia    Hypertension    Hyperthyroidism    s/p radiation therapy, now hypothyroidism   IBS (irritable bowel syndrome)    Migraine    Pacemaker    a. s/p successful  implantation of a Medtronic CapSureFix Novus MRI SureScan serial # [id number] on 06/28/16 by Dr. Caryl Comes for sinus node dysfunction.   Pancreatic cyst    Paroxysmal A-fib [facility])    Renal cell cancer, right ([place]) 10/23/2015   Right partial nephrectomy.    Surgical History: Past Surgical History:  Procedure Laterality Date   ABDOMINAL HYSTERECTOMY     BREAST EXCISIONAL BIOPSY Right 2005   neg   BREAST SURGERY  1990s   Biopsy   CERVICAL SPINE SURGERY     Bone fusion   [place]COPY  [place]?   [place]   HAMMER TOE SURGERY     HERNIA REPAIR Left    Inguinal Hernia Repair   TUBAL LIGATION      Home Medications:  Allergies as of 06/13/2017      Reactions   Adhesive [tape] Other (See Comments)   "irritate skin"   Codeine    REACTION: NAUSEA   Morphine And Related Other (See Comments)   Shut down her organs   Tetanus Toxoid    REACTION: ARM SWELLING,REDNESS   Tramadol Nausea And Vomiting      Medication List        Accurate as of 06/13/17 11:59 PM. Always use your most recent med list.          acetaminophen 325 MG tablet Commonly known as:  TYLENOL Take 2 tablets (650  mg total) by mouth every 6 (six) hours as needed for pain.   ALPRAZolam 0.25 MG tablet Commonly known as:  XANAX TAKE ONE TABLET BY MOUTH TWICE A DAY AS NEEDED FOR ANXIETY   amLODipine 10 MG tablet Commonly known as:  NORVASC Take by mouth.   brimonidine 0.2 % ophthalmic solution Commonly known as:  ALPHAGAN Place 1 drop into both eyes 2 (two) times daily.   carvedilol 3.125 MG tablet Commonly known as:  COREG TAKE ONE TABLET BY MOUTH TWICE A DAY WITH FOOD   D3-1000 PO Take 1,000 Units by mouth daily. Reported on 07/28/2015   dorzolamide 2 % ophthalmic solution Commonly known as:  TRUSOPT   ferrous sulfate 325 (65 FE) MG tablet Take 325 mg by mouth daily with breakfast.   Fish Oil 1000 MG Caps Take 1,000 mg by mouth daily.   fluticasone 50 MCG/ACT nasal spray Commonly  known as:  FLONASE Place 2 sprays into both nostrils daily.   folic acid 1 MG tablet Commonly known as:  FOLVITE Take by mouth.   gabapentin 300 MG capsule Commonly known as:  NEURONTIN TAKE 1 CAPSULE (300 MG TOTAL) BY MOUTH 3 (THREE) TIMES DAILY. AS NEEDED FOR SHINGLES PAIN   hydrALAZINE 10 MG tablet Commonly known as:  APRESOLINE Take by mouth.   hydroxychloroquine 200 MG tablet Commonly known as:  PLAQUENIL Take 400 mg by mouth daily.   latanoprost 0.005 % ophthalmic solution Commonly known as:  XALATAN Place 1 drop into both eyes at bedtime.   levothyroxine 112 MCG tablet Commonly known as:  SYNTHROID, LEVOTHROID TAKE 1 TABLET  BY MOUTH DAILY BEFORE BREAKFAST.   losartan 100 MG tablet Commonly known as:  COZAAR TAKE ONE TABLET BY MOUTH DAILY. NOTE DOSE CHANGED TO 100MG !!   mirabegron ER 50 MG Tb24 tablet Commonly known as:  MYRBETRIQ Take 1 tablet (50 mg total) by mouth daily.   nitroGLYCERIN 0.4 MG/SPRAY spray Commonly known as:  NITROLINGUAL Place 1 spray under the tongue every 5 (five) minutes x 3 doses as needed for chest pain.   pantoprazole 40 MG tablet Commonly known as:  PROTONIX TAKE 1 TABLET (40 MG TOTAL) BY MOUTH DAILY.   SUPER CALCIUM/D 600-125 MG-UNIT Tabs Generic drug:  Calcium-Vitamin D Take 2 tablets by mouth daily.   vitamin C 500 MG tablet Commonly known as:  ASCORBIC ACID Take 500 mg by mouth daily.       Allergies:  Allergies  Allergen Reactions   Adhesive [Tape] Other (See Comments)    "irritate skin"   Codeine     REACTION: NAUSEA   Morphine And Related Other (See Comments)    Shut down her organs   Tetanus Toxoid     REACTION: ARM SWELLING,REDNESS   Tramadol Nausea And Vomiting    Family History: Family History  Problem Relation Age of Onset   Heart disease Mother    Heart disease Father    Diabetes Brother    Kidney disease Brother    Stroke Daughter        due to medication reaction   Breast cancer  Cousin    Prostate cancer Neg Hx    Hematuria Neg Hx     Social History:  reports that she quit smoking about 4 years ago. Her smoking use included cigarettes. She has a 15.00 pack-year smoking history. she has never used smokeless tobacco. She reports that she does not drink alcohol or use drugs.  ROS: UROLOGY Frequent Urination?: Yes Hard to  postpone urination?: No Burning/pain with urination?: No Get up at night to urinate?: Yes Leakage of urine?: No Urine stream starts and stops?: Yes Trouble starting stream?: Yes Do you have to strain to urinate?: No Blood in urine?: No Urinary tract infection?: No Sexually transmitted disease?: No Injury to kidneys or bladder?: No Painful intercourse?: No Weak stream?: No Currently pregnant?: No Vaginal bleeding?: No Last menstrual period?: n  Gastrointestinal Nausea?: No Vomiting?: No Indigestion/heartburn?: Yes Diarrhea?: No Constipation?: No  Constitutional Fever: No Night sweats?: No Weight loss?: No Fatigue?: Yes  Skin Skin rash/lesions?: No Itching?: No  Eyes Blurred vision?: Yes Double vision?: No  Ears/Nose/Throat Sore throat?: No Sinus problems?: Yes  Hematologic/Lymphatic Swollen glands?: No Easy bruising?: Yes  Cardiovascular Leg swelling?: No Chest pain?: No  Respiratory Cough?: No Shortness of breath?: No  Endocrine Excessive thirst?: Yes  Musculoskeletal Back pain?: Yes Joint pain?: Yes  Neurological Headaches?: Yes Dizziness?: Yes  Psychologic Depression?: No Anxiety?: Yes  Physical Exam: BP 133/74    Pulse 77    Ht 5\' 2"  (1.575 m)    Wt 141 lb (64 kg)    BMI 25.79 kg/m   Constitutional:  Alert and oriented, No acute distress.  Well dressed. Ambulating independently with cane. HEENT: [place] AT, moist mucus membranes.  Trachea midline, no masses. Cardiovascular: No clubbing, cyanosis, or edema. Respiratory: Normal respiratory effort, no increased work of breathing. GI: Abdomen is  soft, nontender, nondistended, no abdominal masses.   GU: No CVA tenderness.  Skin: No rashes, bruises or suspicious lesions. Neurologic: Grossly intact, no focal deficits, moving all 4 extremities. Psychiatric: Normal mood and affect.  Tearful at times.    Laboratory Data: Lab Results  Component Value Date   WBC 2.8 (L) 01/16/2017   HGB 13.1 01/16/2017   HCT 39.4 01/16/2017   MCV 91.0 01/16/2017   PLT 107.0 (L) 01/16/2017    Lab Results  Component Value Date   CREATININE 0.70 06/12/2017    Lab Results  Component Value Date   HGBA1C 5.3 06/09/2014    Urinalysis N/a  Imaging: CLINICAL DATA:  Partial nephrectomy 10/23/2015 for right-sided renal cell carcinoma. Currently asymptomatic.  EXAM: CT ABDOMEN AND PELVIS WITHOUT AND WITH CONTRAST  TECHNIQUE: Multidetector CT imaging of the abdomen and pelvis was performed following the standard protocol before and following the bolus administration of intravenous contrast.  CONTRAST:  140mL ISOVUE-300 IOPAMIDOL (ISOVUE-300) INJECTION 61%  COMPARISON:  06/04/2016  FINDINGS: Lower chest: Apparent left lower lobe 1.5 cm nodular density on image 1/series 8, incompletely imaged. Cardiomegaly, accentuated by a pectus excavatum deformity. Incompletely imaged pacer.  Hepatobiliary: Normal liver. Normal gallbladder, without biliary ductal dilatation.  Pancreas: Normal, without mass or ductal dilatation.  Spleen: Normal in size, without focal abnormality.  Adrenals/Urinary Tract: Normal adrenal glands. Punctate lower pole right renal collecting system calculus on image 29/ series 2. No hydronephrosis. Right ureter difficult to follow. Calcifications along its course are favored to be vascular. No bladder calculi.  2.4 cm upper/interpolar right renal cyst or minimally complex cyst.  Surgical changes of partial nephrectomy involving the anterior lower pole right kidney. No locally recurrent disease. Too small  to characterize left renal lesions. Good renal collecting system opacification on delayed images. No bladder mass.  Stomach/Bowel: Normal stomach, without wall thickening. Scattered colonic diverticula. Normal terminal ileum. Normal small bowel.  Vascular/Lymphatic: Advanced aortic and branch vessel atherosclerosis. Patent renal veins. No abdominopelvic adenopathy.  Reproductive: Hysterectomy.  No adnexal mass.  Other: No significant free fluid.  Pelvic floor laxity. Left inguinal hernia repair.  Musculoskeletal: No suspicious osseous lesion. Advanced lumbosacral spondylosis.  IMPRESSION: 1. Status post partial right nephrectomy, without recurrent or abdominopelvic metastatic disease. 2. Suspicion of a left lower lobe pulmonary nodule, only partially imaged. Consider dedicated chest CT. 3. Right nephrolithiasis. 4. Pelvic floor laxity. 5.  Aortic Atherosclerosis (ICD10-I70.0). These results will be called to the ordering clinician or representative by the Radiologist Assistant, and communication documented in the PACS or zVision Dashboard.   Electronically Signed   By: Abigail Miyamoto M.D.   On: 06/06/2017 11:51  CLINICAL DATA:  Evaluate pulmonary nodule  EXAM: CT CHEST WITH CONTRAST  TECHNIQUE: Multidetector CT imaging of the chest was performed during intravenous contrast administration.  CONTRAST:  38mL ISOVUE-300 IOPAMIDOL (ISOVUE-300) INJECTION 61%  COMPARISON:  CT chest 10/30/2015  FINDINGS: Cardiovascular: Left chest wall pacer device is noted with leads in the right atrial appendage and right ventricle. Moderate cardiac enlargement. No pericardial effusion. Aortic atherosclerosis. Calcification within the LAD and left circumflex coronary artery noted.  Mediastinum/Nodes: The trachea appears patent and is midline. Normal appearance of the esophagus. No enlarged axillary or supraclavicular adenopathy. No right paratracheal, sub- carinal or  right hilar adenopathy. Left hilar node is enlarged measuring 1.6 cm, image 76 of series 2. New from previous exam.  Lungs/Pleura: No pleural effusion. Lobulated solid nodule in the left lower lobe measures 2.6 cm, image 80 of series 3. Mild to moderate changes of centrilobular and paraseptal emphysema  Upper Abdomen: No acute findings identified.  Musculoskeletal: Marked multi level degenerative disc disease is identified throughout the thoracic spine.  IMPRESSION: 1. Left lower lobe pulmonary nodule is highly is worrisome for primary bronchogenic carcinoma. Enlarged left hilar lymph node consistent with metastatic adenopathy. Further evaluation with PET-CT and tissue sampling advised. 2. Aortic Atherosclerosis (ICD10-I70.0) and Emphysema (ICD10-J43.9). 3. Multi vessel coronary artery calcification.   Electronically Signed   By: Kerby Moors M.D.   On: 06/12/2017 11:51   CT personally reviewed today  Assessment & Plan:    1. Pulmonary nodule with lymphadenopathy Reviewed CT scan findings with the patient, concerning probable new primary lung cancer  CT PET ordered to expedite care Will arrange for follow-up with Dr. Grayland Nichols in the near future for further evaluation/treatment Will likely need biopsy All questions answered  2. Renal cell carcinoma, right ([place]) S/p R partial nephrectomy 10/23/15 pT1N0M0 NED on CT today- low risk of recurrence Recommend f/u CT abd/ pelvis with and without contrast annually x 3 years with CXR  2. OAB/ urinary incontience Doing well on mybetriq- continue    Return in about 1 year (around 06/13/2018) for CT abd/ pelvis.  Hollice Espy, MD  [facility] [address], [place], [place] [address] [phone]

## 2017-06-14 ENCOUNTER — Encounter: Payer: Self-pay | Admitting: Urology

## 2017-06-17 ENCOUNTER — Other Ambulatory Visit: Payer: Self-pay

## 2017-06-17 ENCOUNTER — Ambulatory Visit (INDEPENDENT_AMBULATORY_CARE_PROVIDER_SITE_OTHER): Payer: Medicare Other | Admitting: Internal Medicine

## 2017-06-17 ENCOUNTER — Telehealth: Payer: Self-pay | Admitting: Internal Medicine

## 2017-06-17 ENCOUNTER — Encounter: Payer: Self-pay | Admitting: Internal Medicine

## 2017-06-17 ENCOUNTER — Encounter: Payer: Self-pay | Admitting: Oncology

## 2017-06-17 ENCOUNTER — Inpatient Hospital Stay: Payer: Medicare Other | Attending: Oncology | Admitting: Oncology

## 2017-06-17 ENCOUNTER — Encounter: Payer: Medicare Other | Admitting: Internal Medicine

## 2017-06-17 ENCOUNTER — Encounter: Payer: Self-pay | Admitting: *Deleted

## 2017-06-17 VITALS — BP 162/80 | HR 70 | Ht 62.0 in | Wt 141.5 lb

## 2017-06-17 VITALS — BP 110/72 | HR 61 | Temp 96.5°F | Resp 20 | Wt 145.2 lb

## 2017-06-17 DIAGNOSIS — D61818 Other pancytopenia: Secondary | ICD-10-CM | POA: Diagnosis not present

## 2017-06-17 DIAGNOSIS — E785 Hyperlipidemia, unspecified: Secondary | ICD-10-CM | POA: Diagnosis not present

## 2017-06-17 DIAGNOSIS — I10 Essential (primary) hypertension: Secondary | ICD-10-CM | POA: Diagnosis not present

## 2017-06-17 DIAGNOSIS — Z79899 Other long term (current) drug therapy: Secondary | ICD-10-CM

## 2017-06-17 DIAGNOSIS — R918 Other nonspecific abnormal finding of lung field: Secondary | ICD-10-CM | POA: Diagnosis not present

## 2017-06-17 DIAGNOSIS — K219 Gastro-esophageal reflux disease without esophagitis: Secondary | ICD-10-CM | POA: Diagnosis not present

## 2017-06-17 DIAGNOSIS — M069 Rheumatoid arthritis, unspecified: Secondary | ICD-10-CM | POA: Diagnosis not present

## 2017-06-17 DIAGNOSIS — E0590 Thyrotoxicosis, unspecified without thyrotoxic crisis or storm: Secondary | ICD-10-CM | POA: Insufficient documentation

## 2017-06-17 DIAGNOSIS — Z923 Personal history of irradiation: Secondary | ICD-10-CM | POA: Diagnosis not present

## 2017-06-17 DIAGNOSIS — F418 Other specified anxiety disorders: Secondary | ICD-10-CM | POA: Insufficient documentation

## 2017-06-17 DIAGNOSIS — E232 Diabetes insipidus: Secondary | ICD-10-CM | POA: Insufficient documentation

## 2017-06-17 DIAGNOSIS — K589 Irritable bowel syndrome without diarrhea: Secondary | ICD-10-CM | POA: Insufficient documentation

## 2017-06-17 DIAGNOSIS — I70219 Atherosclerosis of native arteries of extremities with intermittent claudication, unspecified extremity: Secondary | ICD-10-CM

## 2017-06-17 DIAGNOSIS — Z905 Acquired absence of kidney: Secondary | ICD-10-CM

## 2017-06-17 DIAGNOSIS — I480 Paroxysmal atrial fibrillation: Secondary | ICD-10-CM | POA: Insufficient documentation

## 2017-06-17 DIAGNOSIS — C641 Malignant neoplasm of right kidney, except renal pelvis: Secondary | ICD-10-CM | POA: Insufficient documentation

## 2017-06-17 DIAGNOSIS — Z87891 Personal history of nicotine dependence: Secondary | ICD-10-CM | POA: Diagnosis not present

## 2017-06-17 DIAGNOSIS — Z87442 Personal history of urinary calculi: Secondary | ICD-10-CM | POA: Diagnosis not present

## 2017-06-17 DIAGNOSIS — R911 Solitary pulmonary nodule: Secondary | ICD-10-CM

## 2017-06-17 DIAGNOSIS — R001 Bradycardia, unspecified: Secondary | ICD-10-CM

## 2017-06-17 DIAGNOSIS — Z803 Family history of malignant neoplasm of breast: Secondary | ICD-10-CM | POA: Diagnosis not present

## 2017-06-17 DIAGNOSIS — Z950 Presence of cardiac pacemaker: Secondary | ICD-10-CM

## 2017-06-17 LAB — CUP PACEART REMOTE DEVICE CHECK
Battery Remaining Longevity: 116 mo
Brady Statistic AP VP Percent: 0.46 %
Brady Statistic AP VS Percent: 62.99 %
Brady Statistic AS VS Percent: 36.5 %
Brady Statistic RV Percent Paced: 0.51 %
Implantable Lead Implant Date: 20171124
Implantable Lead Location: 753859
Implantable Lead Model: 5076
Lead Channel Impedance Value: 342 Ohm
Lead Channel Impedance Value: 380 Ohm
Lead Channel Impedance Value: 418 Ohm
Lead Channel Impedance Value: 494 Ohm
Lead Channel Pacing Threshold Amplitude: 0.875 V
Lead Channel Pacing Threshold Pulse Width: 0.4 ms
Lead Channel Sensing Intrinsic Amplitude: 1 mV
Lead Channel Sensing Intrinsic Amplitude: 11.875 mV
Lead Channel Sensing Intrinsic Amplitude: 11.875 mV
Lead Channel Setting Pacing Amplitude: 2.5 V
Lead Channel Setting Pacing Pulse Width: 0.4 ms
Lead Channel Setting Sensing Sensitivity: 0.9 mV
MDC IDC LEAD IMPLANT DT: 20171124
MDC IDC LEAD LOCATION: 753860
MDC IDC MSMT BATTERY VOLTAGE: 3.03 V
MDC IDC MSMT LEADCHNL RA PACING THRESHOLD AMPLITUDE: 0.75 V
MDC IDC MSMT LEADCHNL RA PACING THRESHOLD PULSEWIDTH: 0.4 ms
MDC IDC MSMT LEADCHNL RA SENSING INTR AMPL: 1 mV
MDC IDC PG IMPLANT DT: 20171124
MDC IDC SESS DTM: 20181108145512
MDC IDC SET LEADCHNL RA PACING AMPLITUDE: 2 V
MDC IDC STAT BRADY AS VP PERCENT: 0.06 %
MDC IDC STAT BRADY RA PERCENT PACED: 63.12 %

## 2017-06-17 MED ORDER — CARVEDILOL 6.25 MG PO TABS: 6.2500 mg | ORAL_TABLET | Freq: Two times a day (BID) | ORAL | 3 refills | Status: DC

## 2017-06-17 NOTE — Progress Notes (Signed)
Patient Care Team: [name], MD as PCP - General (Internal Medicine) [name], MD (Internal Medicine) [name] [name], MD (General Surgery) [name], RN as Registered Nurse   HPI  Caroline Nichols is a 76 y.o. female Seen in followup for pacer implanted for symptomatic bradycardia  She is better post pacing   Initially complicated by hematoma, but resolved and well healed  Has hx of renal cell cancer,  A nodule has been found in her lung and she is scheduled for PET scan   DATE TEST    3/17    echo   EF 65 %  mild LAE  (2.3)   8/17    myoview   EF 69 % No ischemia   Date Cr  9//18 0.8         Records and Results Reviewed   Past Medical History:  Diagnosis Date   Anemia 02/2013   F/U with Dr. Grayland Ormond for Feraheme injections   Anxiety    Arthritis    Possible RA - Follow up appt with Dr. Jefm Bryant   Cellulitis of arm, right    Diabetes insipidus ([place])    On desmopressin   GERD (gastroesophageal reflux disease)    H/O seasonal allergies    H/O transfusion of packed red blood cells 02/2013   3 units PRBC for severe anemia 02/2013   Herniated disc    History of kidney stones    Hyperlipidemia    Hypertension    Hyperthyroidism    s/p radiation therapy, now hypothyroidism   IBS (irritable bowel syndrome)    Migraine    Pacemaker    a. s/p successful implantation of a Medtronic CapSureFix Novus MRI SureScan serial # [id number] on 06/28/16 by Dr. Caryl Comes for sinus node dysfunction.   Pancreatic cyst    Paroxysmal A-fib [facility])    Renal cell cancer, right ([place]) 10/23/2015   Right partial nephrectomy.    Past Surgical History:  Procedure Laterality Date   ABDOMINAL HYSTERECTOMY     BREAST EXCISIONAL BIOPSY Right 2005   neg   BREAST SURGERY  1990s   Biopsy   CERVICAL SPINE SURGERY     Bone fusion   COLONOSCOPY  2008?   [place]   HAMMER TOE SURGERY     HERNIA REPAIR Left    Inguinal Hernia Repair     TUBAL LIGATION      Current Outpatient Medications  Medication Sig Dispense Refill   acetaminophen (TYLENOL) 325 MG tablet Take 2 tablets (650 mg total) by mouth every 6 (six) hours as needed for pain.     ALPRAZolam (XANAX) 0.25 MG tablet TAKE ONE TABLET BY MOUTH TWICE A DAY AS NEEDED FOR ANXIETY 60 tablet 2   amLODipine (NORVASC) 10 MG tablet Take by mouth.     brimonidine (ALPHAGAN) 0.2 % ophthalmic solution Place 1 drop into both eyes 2 (two) times daily.     Calcium-Vitamin D (SUPER CALCIUM/D) 600-125 MG-UNIT TABS Take 2 tablets by mouth daily.      Cholecalciferol (D3-1000 PO) Take 1,000 Units by mouth daily. Reported on 07/28/2015     dorzolamide (TRUSOPT) 2 % ophthalmic solution      ferrous sulfate 325 (65 FE) MG tablet Take 325 mg by mouth daily with breakfast.     folic acid (FOLVITE) 1 MG tablet Take by mouth.     gabapentin (NEURONTIN) 300 MG capsule TAKE 1 CAPSULE (300 MG TOTAL) BY MOUTH 3 (THREE)  TIMES DAILY. AS NEEDED FOR SHINGLES PAIN 90 capsule 1   hydrALAZINE (APRESOLINE) 10 MG tablet Take by mouth.     hydroxychloroquine (PLAQUENIL) 200 MG tablet Take 400 mg by mouth daily.      latanoprost (XALATAN) 0.005 % ophthalmic solution Place 1 drop into both eyes at bedtime.      levothyroxine (SYNTHROID, LEVOTHROID) 112 MCG tablet TAKE 1 TABLET  BY MOUTH DAILY BEFORE BREAKFAST. 90 tablet 0   losartan (COZAAR) 100 MG tablet TAKE ONE TABLET BY MOUTH DAILY. NOTE DOSE CHANGED TO 100MG !! 90 tablet 1   mirabegron ER (MYRBETRIQ) 50 MG TB24 tablet Take 1 tablet (50 mg total) by mouth daily. (Patient taking differently: Take 50 mg by mouth every evening. ) 30 tablet 2   nitroGLYCERIN (NITROLINGUAL) 0.4 MG/SPRAY spray Place 1 spray under the tongue every 5 (five) minutes x 3 doses as needed for chest pain. 4.9 g 0   Omega-3 Fatty Acids (FISH OIL) 1000 MG CAPS Take 1,000 mg by mouth daily.      pantoprazole (PROTONIX) 40 MG tablet TAKE 1 TABLET (40 MG TOTAL) BY MOUTH  DAILY. 90 tablet 3   vitamin C (ASCORBIC ACID) 500 MG tablet Take 500 mg by mouth daily.     carvedilol (COREG) 6.25 MG tablet Take 1 tablet (6.25 mg total) 2 (two) times daily by mouth. 180 tablet 3   No current facility-administered medications for this visit.     Allergies  Allergen Reactions   Adhesive [Tape] Other (See Comments)    "irritate skin"   Codeine     REACTION: NAUSEA   Morphine And Related Other (See Comments)    Shut down her organs   Tetanus Toxoid     REACTION: ARM SWELLING,REDNESS   Tramadol Nausea And Vomiting      Review of Systems negative except from HPI and PMH  Physical Exam BP (!) 162/80 (BP Location: Left Arm, Patient Position: Sitting, Cuff Size: Normal)    Pulse 70    Ht 5\' 2"  (1.575 m)    Wt 141 lb 8 oz (64.2 kg)    BMI 25.88 kg/m  Well developed and nourished in no acute distress HENT normal Neck supple with JVP-flat Carotids brisk and full without bruits Clear Device pocket well healed; without hematoma or erythema.  There is no tethering  Regular rate and rhythm, no murmurs or gallops Abd-soft with active BS without hepatomegaly No Clubbing cyanosis edema Skin-warm and dry A & Oriented  Grossly normal sensory and motor function Affect sad and tearful  ECG demonstrates sinus @ 71 18/08/44 LVH    Assessment and  Plan  Sinus node dysfunction  Pacemaker-Medtronic The patient's device was interrogated and the information was fully reviewed.  The device was reprogrammed to maxi longevity  Hypertension poorly controlled   Nonsustained atrial tachycardia/fibrillation duration less than 1 minute  Sadness   Device function normal  Improved symptomatic sinus bradycardia  BP elevated   Will increase coreg 3.125>>6.25  PWP re cancer and PET    Current medicines are reviewed at length with the patient today .  The patient does not have concerns regarding medicines.

## 2017-06-17 NOTE — Patient Instructions (Addendum)
Medication Instructions:  Your physician has recommended you make the following change in your medication:  INCREASE coreg to 6.25mg  twice daily   Labwork: none  Testing/Procedures: Remote monitoring is used to monitor your Pacemaker of ICD from home. This monitoring reduces the number of office visits required to check your device to one time per year. It allows [place] to keep an eye on the functioning of your device to ensure it is working properly. You are scheduled for a device check from home on September 11, 2017. You may send your transmission at any time that day. If you have a wireless device, the transmission will be sent automatically. After your physician reviews your transmission, you will receive a postcard with your next transmission date.  Follow-Up: Your physician wants you to follow-up in: August 2018.  You will receive a reminder letter in the mail two months in advance. If you don't receive a letter, please call our office to schedule the follow-up appointment.    Any Other Special Instructions Will Be Listed Below (If Applicable).     If you need a refill on your cardiac medications before your next appointment, please call your pharmacy.

## 2017-06-17 NOTE — Progress Notes (Signed)
Patient here today for evaluation regarding lung nodule seen on recent CT.

## 2017-06-17 NOTE — Telephone Encounter (Signed)
appt scheduled and [facility] informed. Nothing further needed.

## 2017-06-17 NOTE — Progress Notes (Deleted)
Imaging personally reviewed, CT chest 06/12/17, there is severe diffuse bilateral emphysema, worse in the apices.  Minimal subcarinal or other thoracic lymphadenopathy.  There is a lung mass adjacent to the left lower lobe bronchus/bronchus intermedius which may be amenable to EBUS, another mass in the left lower lobe which appears adjacent to the chest wall.

## 2017-06-17 NOTE — Progress Notes (Signed)
  Oncology Nurse Navigator Documentation  Navigator Location: [facility]-Med Onc (06/17/17 1100) Referral date to RadOnc/MedOnc: 06/14/17 (06/17/17 1100) )Navigator Encounter Type: Initial MedOnc (06/17/17 1100)   Abnormal Finding Date: 06/06/17 (06/17/17 1100)                   Treatment Phase: Abnormal Scans (06/17/17 1100) Barriers/Navigation Needs: Coordination of Care (06/17/17 1100)   Interventions: Coordination of Care (06/17/17 1100)   Coordination of Care: Appts;Broncoscopy (06/17/17 1100)        Acuity: Level 2 (06/17/17 1100)   Acuity Level 2: Initial guidance, education and coordination as needed;Educational needs;Assistance expediting appointments (06/17/17 1100)    met with patient during initial med-onc consultation with Dr. Grayland Ormond. All questions answered at the time of visit. Informed pt to expect phone call from me later today with appt details to see pulmonary to evaluated for bronchoscopy. Instructed pt that she will be notified when to follow up with Dr. Grayland Ormond once bronchoscopy is scheduled. Contact info given and instructed pt to call with any questions or needs.   Phone call made to patient on 11/13 at 1120 and left message with appt details to see Dr. Juanell Fairly on 11/14 at [facility], arrive at 8:45am.  Time Spent with Patient: 60 (06/17/17 1100)

## 2017-06-17 NOTE — Telephone Encounter (Signed)
Needs to schedule a bronch. Pt cannot do Fridays.

## 2017-06-18 ENCOUNTER — Telehealth: Payer: Self-pay | Admitting: Urology

## 2017-06-18 ENCOUNTER — Institutional Professional Consult (permissible substitution): Payer: Medicare Other | Admitting: Internal Medicine

## 2017-06-18 NOTE — Telephone Encounter (Signed)
From what I can tell she was seen yesterday at the cancer center   [facility]

## 2017-06-18 NOTE — Progress Notes (Addendum)
[facility] Medicine Consultation      Assessment and Plan:  The patient is a 76 year old female with rheumatoid arthritis, now presents with lung mass.  Lung mass -There appears to be 2 lesions, a left infrahilar lymph node, and a left peripheral lung nodule.  The infrahilar nodule might not be accessible by EBUS bronchoscopy, but would likely be accessible via navigational bronchoscopy.  The peripheral nodule may be accessible via CT-guided biopsy. -We will await results of upcoming PET scan, to see if one or both of these nodules has increased uptake and determine which one is the better target for biopsy.  Emphysema. - Biapical emphysema seen on CT chest.  In terms of her respiratory status she is minimally symptomatic from this, therefore there is no need to start an empiric inhaler at this time. - We will send for pulmonary function test.  She cannot increase her exercise level due to peripheral neuropathy and lower extremity pain from rheumatoid arthritis.  Orders Placed This Encounter  Procedures  . Pulmonary Function Test [facility] Only   Addendum 06/24/17: PET scan 06/24/17 images personally reviewed, shows positive uptake in both the left infrahilar in the peripheral left lung nodule.  As discussed with the patient and at tumor board will send the patient to radiology for a CT-guided needle biopsy.  Patient was informed of results and a plan via telephone.   Date: 06/19/2017  MRN# [id number] Caroline Nichols 1941/04/02  Referring Physician:   [name] is a 76 y.o. old female seen in consultation for chief complaint of:    Chief Complaint  Patient presents with  . Advice Only    referred by Dr. Orlie Nichols lung mass, pt is asymptomatic.    HPI:  She has a diagnosis of RA, she notes that her breathing is ok. She lives in independent living, she is able to do her ADL's without difficulty, she walks with a cane, but has been told that she should use a rolling walker.  She is limited by pain and neuropathy in her legs but is not limited by breathing.  She used to smoke up to a ppd, last smoked about 5 years ago. Her parents and husband smoked.  She has a PET scan next week.   Imaging personally reviewed, CT chest 06/12/17, there is severe diffuse bilateral emphysema, worse in the apices.  Minimal subcarinal or other thoracic lymphadenopathy.  There is a lung mass adjacent to the left lower lobe bronchus/bronchus intermedius which may be amenable to EBUS, another mass in the left lower lobe which appears adjacent to the chest wall.   PMHX:   Past Medical History:  Diagnosis Date  . Anemia 02/2013   F/U with Dr. Orlie Nichols for Feraheme injections  . Anxiety   . Arthritis    Possible RA - Follow up appt with Dr. Gavin Nichols  . Cellulitis of arm, right   . Diabetes insipidus (HCC)    On desmopressin  . GERD (gastroesophageal reflux disease)   . H/O seasonal allergies   . H/O transfusion of packed red blood cells 02/2013   3 units PRBC for severe anemia 02/2013  . Herniated disc   . History of kidney stones   . Hyperlipidemia   . Hypertension   . Hyperthyroidism    s/p radiation therapy, now hypothyroidism  . IBS (irritable bowel syndrome)   . Migraine   . Pacemaker    a. s/p successful implantation of a Medtronic CapSureFix Novus MRIT SureScan serial #  [id number] on 06/28/16 by Dr. Graciela Nichols for sinus node dysfunction.  . Pancreatic cyst   . Paroxysmal A-fib (HCC)   . Renal cell cancer, right (HCC) 10/23/2015   Right partial nephrectomy.   Surgical Hx:  Past Surgical History:  Procedure Laterality Date  . ABDOMINAL HYSTERECTOMY    . BREAST EXCISIONAL BIOPSY Right 2005   neg  . BREAST SURGERY  1990s   Biopsy  . CERVICAL SPINE SURGERY     Bone fusion  . COLONOSCOPY  2008?   Caroline Nichols  . COLONOSCOPY WITH PROPOFOL N/A 10/15/2016   Procedure: COLONOSCOPY WITH PROPOFOL;  Surgeon: Caroline Mayotte, MD;  Location: [facility] ENDOSCOPY;  Service: Endoscopy;   Laterality: N/A;  . EP IMPLANTABLE DEVICE N/A 06/28/2016   Procedure: Pacemaker Implant;  Surgeon: Caroline Salvia, MD;  Location: [facility] INVASIVE CV LAB;  Service: Cardiovascular;  Laterality: N/A;  . HAMMER TOE SURGERY    . HERNIA REPAIR Left    Inguinal Hernia Repair  . ROBOTIC ASSITED PARTIAL NEPHRECTOMY Right 10/23/2015   Procedure: ROBOTIC ASSITED PARTIAL NEPHRECTOMY with intraop ultrasound;  Surgeon: Caroline Scotland, MD;  Location: [facility] ORS;  Service: Urology;  Laterality: Right;  . TUBAL LIGATION     Family Hx:  Family History  Problem Relation Age of Onset  . Heart disease Mother   . Heart disease Father   . Diabetes Brother   . Kidney disease Brother   . Stroke Daughter        due to medication reaction  . Breast cancer Cousin   . Prostate cancer Neg Hx   . Hematuria Neg Hx    Social Hx:   Social History   Tobacco Use  . Smoking status: Former Smoker    Packs/day: 0.50    Years: 30.00    Pack years: 15.00    Types: Cigarettes    Last attempt to quit: 12/03/2012    Years since quitting: 4.5  . Smokeless tobacco: Never Used  Substance Use Topics  . Alcohol use: No    Alcohol/week: 0.0 oz  . Drug use: No   Medication:    Current Outpatient Medications:  .  acetaminophen (TYLENOL) 325 MG tablet, Take 2 tablets (650 mg total) by mouth every 6 (six) hours as needed for pain., Disp: , Rfl:  .  ALPRAZolam (XANAX) 0.25 MG tablet, TAKE ONE TABLET BY MOUTH TWICE A DAY AS NEEDED FOR ANXIETY, Disp: 60 tablet, Rfl: 2 .  amLODipine (NORVASC) 10 MG tablet, Take by mouth., Disp: , Rfl:  .  brimonidine (ALPHAGAN) 0.2 % ophthalmic solution, Place 1 drop into both eyes 2 (two) times daily., Disp: , Rfl:  .  Calcium-Vitamin D (SUPER CALCIUM/D) 600-125 MG-UNIT TABS, Take 2 tablets by mouth daily. , Disp: , Rfl:  .  carvedilol (COREG) 6.25 MG tablet, Take 1 tablet (6.25 mg total) 2 (two) times daily by mouth., Disp: 180 tablet, Rfl: 3 .  Cholecalciferol (D3-1000 PO), Take 1,000 Units by  mouth daily. Reported on 07/28/2015, Disp: , Rfl:  .  dorzolamide (TRUSOPT) 2 % ophthalmic solution, , Disp: , Rfl:  .  ferrous sulfate 325 (65 FE) MG tablet, Take 325 mg by mouth daily with breakfast., Disp: , Rfl:  .  folic acid (FOLVITE) 1 MG tablet, Take by mouth., Disp: , Rfl:  .  gabapentin (NEURONTIN) 300 MG capsule, TAKE 1 CAPSULE (300 MG TOTAL) BY MOUTH 3 (THREE) TIMES DAILY. AS NEEDED FOR SHINGLES PAIN, Disp: 90 capsule, Rfl: 1 .  hydrALAZINE (APRESOLINE)  10 MG tablet, Take by mouth., Disp: , Rfl:  .  hydroxychloroquine (PLAQUENIL) 200 MG tablet, Take 400 mg by mouth daily. , Disp: , Rfl:  .  latanoprost (XALATAN) 0.005 % ophthalmic solution, Place 1 drop into both eyes at bedtime. , Disp: , Rfl:  .  levothyroxine (SYNTHROID, LEVOTHROID) 112 MCG tablet, TAKE 1 TABLET  BY MOUTH DAILY BEFORE BREAKFAST., Disp: 90 tablet, Rfl: 0 .  losartan (COZAAR) 100 MG tablet, TAKE ONE TABLET BY MOUTH DAILY. NOTE DOSE CHANGED TO 100MG !!, Disp: 90 tablet, Rfl: 1 .  mirabegron ER (MYRBETRIQ) 50 MG TB24 tablet, Take 1 tablet (50 mg total) by mouth daily. (Patient taking differently: Take 50 mg by mouth every evening. ), Disp: 30 tablet, Rfl: 2 .  nitroGLYCERIN (NITROLINGUAL) 0.4 MG/SPRAY spray, Place 1 spray under the tongue every 5 (five) minutes x 3 doses as needed for chest pain., Disp: 4.9 g, Rfl: 0 .  Omega-3 Fatty Acids (FISH OIL) 1000 MG CAPS, Take 1,000 mg by mouth daily. , Disp: , Rfl:  .  pantoprazole (PROTONIX) 40 MG tablet, TAKE 1 TABLET (40 MG TOTAL) BY MOUTH DAILY., Disp: 90 tablet, Rfl: 3 .  vitamin C (ASCORBIC ACID) 500 MG tablet, Take 500 mg by mouth daily., Disp: , Rfl:    Allergies:  Adhesive [tape]; Codeine; Morphine and related; Tetanus toxoid; and Tramadol  Review of Systems: Gen:  Denies  fever, sweats, chills HEENT: Denies blurred vision, double vision. bleeds, sore throat Cvc:  No dizziness, chest pain. Resp:   Denies cough or sputum production, shortness of breath Gi: Denies  swallowing difficulty, stomach pain. Gu:  Denies bladder incontinence, burning urine Ext:   No Joint pain, stiffness. Skin: No skin rash,  hives  Endoc:  No polyuria, polydipsia. Psych: No depression, insomnia. Other:  All other systems were reviewed with the patient and were negative other that what is mentioned in the HPI.   Physical Examination:   VS: BP (!) 152/98 (Cuff Size: Normal)   Pulse 75   Resp 16   Ht 5\' 2"  (1.575 m)   Wt 143 lb (64.9 kg)   SpO2 97%   BMI 26.16 kg/m   General Appearance: No distress  Neuro:without focal findings,  speech normal,  HEENT: PERRLA, EOM intact.   Pulmonary: normal breath sounds, No wheezing.  CardiovascularNormal S1,S2.  No m/r/g.   Abdomen: Benign, Soft, non-tender. Renal:  No costovertebral tenderness  GU:  No performed at this time. Endoc: No evident thyromegaly, no signs of acromegaly. Skin:   warm, no rashes, no ecchymosis  Extremities: normal, no cyanosis, clubbing.  Other findings:    LABORATORY PANEL:   CBC No results for input(s): WBC, HGB, HCT, PLT in the last 168 hours. ------------------------------------------------------------------------------------------------------------------  Chemistries  Recent Labs  Lab 06/12/17 0947  CREATININE 0.70   ------------------------------------------------------------------------------------------------------------------  Cardiac Enzymes No results for input(s): TROPONINI in the last 168 hours. ------------------------------------------------------------  RADIOLOGY:  No results found.     Thank  you for the consultation and for allowing [facility] to assist in the care of your patient. Our recommendations are noted above.  Please contact us if we can be of further service.   Wells Guiles, MD.  Board Certified in Internal Medicine, Pulmonary Medicine, Critical Care Medicine, and Sleep Medicine.  [facility] Office Number: [phone]  Santiago Glad, M.D.  Billy Fischer, M.D  06/19/2017

## 2017-06-18 NOTE — Progress Notes (Signed)
[place] [facility] [phone]  [phone]     Caroline Nichols OB: [birth year]  MR#: [id number]  [facility]#:[id number]  Patient Care Team: [name], MD as PCP - General (Internal Medicine) [name], MD (Internal Medicine) [name], [name], MD (General Surgery) [name], RN as Registered Nurse  CHIEF COMPLAINT:   Pancytopenia, Renal cell carcinoma of right kidney, now with new left lower lobe lung mass.  HISTORY OF PRESENT ILLNESS:  Patient last evaluated in clinic in February 2018.  She is referred back after CT scan revealed a left lower lobe lung mass highly suspicious for malignancy.  She is anxious, but otherwise feels well.  She has no neurologic complaints.  She denies any recent fevers or illnesses. She has a good appetite and denies weight loss.  She denies any chest pain, cough, hemoptysis, or shortness of breath. She denies any nausea, vomiting, constipation, or diarrhea. She has no urinary complaints. Patient offers no specific complaints today.   REVIEW OF SYSTEMS:    Review of Systems  Constitutional: Negative.  Negative for fever, malaise/fatigue and weight loss.  Respiratory: Negative.  Negative for cough and shortness of breath.   Cardiovascular: Negative.  Negative for chest pain and leg swelling.  Gastrointestinal: Negative.   Genitourinary: Negative.   Musculoskeletal: Negative.   Skin: Negative.  Negative for rash.  Neurological: Negative.  Negative for sensory change and weakness.  Psychiatric/Behavioral: The patient is nervous/anxious.   All other systems reviewed and are negative.   PAST MEDICAL HISTORY: Past Medical History:  Diagnosis Date   Anemia 02/2013   F/U with Dr. Grayland Ormond for Feraheme injections   Anxiety    Arthritis    Possible RA - Follow up appt with Dr. Jefm Bryant   Cellulitis of arm, right    Diabetes insipidus ([place])    On desmopressin   GERD (gastroesophageal reflux disease)    H/O seasonal  allergies    H/O transfusion of packed red blood cells 02/2013   3 units PRBC for severe anemia 02/2013   Herniated disc    History of kidney stones    Hyperlipidemia    Hypertension    Hyperthyroidism    s/p radiation therapy, now hypothyroidism   IBS (irritable bowel syndrome)    Migraine    Pacemaker    a. s/p successful implantation of a Medtronic CapSureFix Novus MRI SureScan serial # [id number] on 06/28/16 by Dr. Caryl Comes for sinus node dysfunction.   Pancreatic cyst    Paroxysmal A-fib [facility])    Renal cell cancer, right ([place]) 10/23/2015   Right partial nephrectomy.    PAST SURGICAL HISTORY: Past Surgical History:  Procedure Laterality Date   ABDOMINAL HYSTERECTOMY     BREAST EXCISIONAL BIOPSY Right 2005   neg   BREAST SURGERY  1990s   Biopsy   CERVICAL SPINE SURGERY     Bone fusion   COLONOSCOPY  2008?   [place]   HAMMER TOE SURGERY     HERNIA REPAIR Left    Inguinal Hernia Repair   TUBAL LIGATION      FAMILY HISTORY Family History  Problem Relation Age of Onset   Heart disease Mother    Heart disease Father    Diabetes Brother    Kidney disease Brother    Stroke Daughter        due to medication reaction   Breast cancer Cousin    Prostate cancer Neg Hx    Hematuria Neg Hx  ADVANCED DIRECTIVES:  No flowsheet data found.  HEALTH MAINTENANCE: Social History   Tobacco Use   Smoking status: Former Smoker    Packs/day: 0.50    Years: 30.00    Pack years: 15.00    Types: Cigarettes    Last attempt to quit: 12/03/2012    Years since quitting: 4.5   Smokeless tobacco: Never Used  Substance Use Topics   Alcohol use: No    Alcohol/week: 0.0 oz   Drug use: No     Allergies  Allergen Reactions   Adhesive [Tape] Other (See Comments)    "irritate skin"   Codeine     REACTION: NAUSEA   Morphine And Related Other (See Comments)    Shut down her organs   Tetanus Toxoid     REACTION: ARM SWELLING,REDNESS    Tramadol Nausea And Vomiting    Current Outpatient Medications  Medication Sig Dispense Refill   acetaminophen (TYLENOL) 325 MG tablet Take 2 tablets (650 mg total) by mouth every 6 (six) hours as needed for pain.     ALPRAZolam (XANAX) 0.25 MG tablet TAKE ONE TABLET BY MOUTH TWICE A DAY AS NEEDED FOR ANXIETY 60 tablet 2   amLODipine (NORVASC) 10 MG tablet Take by mouth.     brimonidine (ALPHAGAN) 0.2 % ophthalmic solution Place 1 drop into both eyes 2 (two) times daily.     Calcium-Vitamin D (SUPER CALCIUM/D) 600-125 MG-UNIT TABS Take 2 tablets by mouth daily.      Cholecalciferol (D3-1000 PO) Take 1,000 Units by mouth daily. Reported on 07/28/2015     dorzolamide (TRUSOPT) 2 % ophthalmic solution      ferrous sulfate 325 (65 FE) MG tablet Take 325 mg by mouth daily with breakfast.     folic acid (FOLVITE) 1 MG tablet Take by mouth.     gabapentin (NEURONTIN) 300 MG capsule TAKE 1 CAPSULE (300 MG TOTAL) BY MOUTH 3 (THREE) TIMES DAILY. AS NEEDED FOR SHINGLES PAIN 90 capsule 1   hydrALAZINE (APRESOLINE) 10 MG tablet Take by mouth.     hydroxychloroquine (PLAQUENIL) 200 MG tablet Take 400 mg by mouth daily.      latanoprost (XALATAN) 0.005 % ophthalmic solution Place 1 drop into both eyes at bedtime.      levothyroxine (SYNTHROID, LEVOTHROID) 112 MCG tablet TAKE 1 TABLET  BY MOUTH DAILY BEFORE BREAKFAST. 90 tablet 0   losartan (COZAAR) 100 MG tablet TAKE ONE TABLET BY MOUTH DAILY. NOTE DOSE CHANGED TO 100MG!! 90 tablet 1   mirabegron ER (MYRBETRIQ) 50 MG TB24 tablet Take 1 tablet (50 mg total) by mouth daily. (Patient taking differently: Take 50 mg by mouth every evening. ) 30 tablet 2   nitroGLYCERIN (NITROLINGUAL) 0.4 MG/SPRAY spray Place 1 spray under the tongue every 5 (five) minutes x 3 doses as needed for chest pain. 4.9 g 0   Omega-3 Fatty Acids (FISH OIL) 1000 MG CAPS Take 1,000 mg by mouth daily.      pantoprazole (PROTONIX) 40 MG tablet TAKE 1 TABLET (40 MG TOTAL) BY  MOUTH DAILY. 90 tablet 3   vitamin C (ASCORBIC ACID) 500 MG tablet Take 500 mg by mouth daily.     carvedilol (COREG) 6.25 MG tablet Take 1 tablet (6.25 mg total) 2 (two) times daily by mouth. 180 tablet 3   No current facility-administered medications for this visit.     OBJECTIVE:  Vitals:   06/17/17 0900  BP: 110/72  Pulse: 61  Resp: 20  Temp: (!) 96.5 F (  35.8 C)     Body mass index is 26.56 kg/m.    ECOG FS:0 - Asymptomatic  Physical Exam   General: Well-developed, well-nourished, no acute distress. Eyes: Pink conjunctiva, anicteric sclera. Lungs: Clear to auscultation bilaterally. Heart: Regular rate and rhythm. No rubs, murmurs, or gallops. Abdomen: Soft, nontender, nondistended. No organomegaly noted, normoactive bowel sounds. Musculoskeletal: No edema, cyanosis, or clubbing. Neuro: Alert, answering all questions appropriately. Cranial nerves grossly intact. Skin: No rashes or petechiae noted. Psych: Normal affect.   LAB RESULTS:  No visits with results within 5 Day(s) from this visit.  Latest known visit with results is:  Hospital Outpatient Visit on 06/12/2017  Component Date Value Ref Range Status   Creatinine, Ser 06/12/2017 0.70  0.44 - 1.00 mg/dL Final       STUDIES: Ct Abdomen Pelvis W Wo Contrast  Result Date: 06/06/2017 CLINICAL DATA:  Partial nephrectomy 10/23/2015 for right-sided renal cell carcinoma. Currently asymptomatic. EXAM: CT ABDOMEN AND PELVIS WITHOUT AND WITH CONTRAST TECHNIQUE: Multidetector CT imaging of the abdomen and pelvis was performed following the standard protocol before and following the bolus administration of intravenous contrast. CONTRAST:  156m ISOVUE-300 IOPAMIDOL (ISOVUE-300) INJECTION 61% COMPARISON:  06/04/2016 FINDINGS: Lower chest: Apparent left lower lobe 1.5 cm nodular density on image 1/series 8, incompletely imaged. Cardiomegaly, accentuated by a pectus excavatum deformity. Incompletely imaged pacer.  Hepatobiliary: Normal liver. Normal gallbladder, without biliary ductal dilatation. Pancreas: Normal, without mass or ductal dilatation. Spleen: Normal in size, without focal abnormality. Adrenals/Urinary Tract: Normal adrenal glands. Punctate lower pole right renal collecting system calculus on image 29/ series 2. No hydronephrosis. Right ureter difficult to follow. Calcifications along its course are favored to be vascular. No bladder calculi. 2.4 cm upper/interpolar right renal cyst or minimally complex cyst. Surgical changes of partial nephrectomy involving the anterior lower pole right kidney. No locally recurrent disease. Too small to characterize left renal lesions. Good renal collecting system opacification on delayed images. No bladder mass. Stomach/Bowel: Normal stomach, without wall thickening. Scattered colonic diverticula. Normal terminal ileum. Normal small bowel. Vascular/Lymphatic: Advanced aortic and branch vessel atherosclerosis. Patent renal veins. No abdominopelvic adenopathy. Reproductive: Hysterectomy.  No adnexal mass. Other: No significant free fluid. Pelvic floor laxity. Left inguinal hernia repair. Musculoskeletal: No suspicious osseous lesion. Advanced lumbosacral spondylosis. IMPRESSION: 1. Status post partial right nephrectomy, without recurrent or abdominopelvic metastatic disease. 2. Suspicion of a left lower lobe pulmonary nodule, only partially imaged. Consider dedicated chest CT. 3. Right nephrolithiasis. 4. Pelvic floor laxity. 5.  Aortic Atherosclerosis (ICD10-I70.0). These results will be called to the ordering clinician or representative by the Radiologist Assistant, and communication documented in the PACS or zVision Dashboard. Electronically Signed   By: KAbigail MiyamotoM.D.   On: 06/06/2017 11:51   Ct Chest W Contrast  Result Date: 06/12/2017 CLINICAL DATA:  Evaluate pulmonary nodule EXAM: CT CHEST WITH CONTRAST TECHNIQUE: Multidetector CT imaging of the chest was performed  during intravenous contrast administration. CONTRAST:  710mISOVUE-300 IOPAMIDOL (ISOVUE-300) INJECTION 61% COMPARISON:  CT chest 10/30/2015 FINDINGS: Cardiovascular: Left chest wall pacer device is noted with leads in the right atrial appendage and right ventricle. Moderate cardiac enlargement. No pericardial effusion. Aortic atherosclerosis. Calcification within the LAD and left circumflex coronary artery noted. Mediastinum/Nodes: The trachea appears patent and is midline. Normal appearance of the esophagus. No enlarged axillary or supraclavicular adenopathy. No right paratracheal, sub- carinal or right hilar adenopathy. Left hilar node is enlarged measuring 1.6 cm, image 76 of series 2. New from previous  exam. Lungs/Pleura: No pleural effusion. Lobulated solid nodule in the left lower lobe measures 2.6 cm, image 80 of series 3. Mild to moderate changes of centrilobular and paraseptal emphysema Upper Abdomen: No acute findings identified. Musculoskeletal: Marked multi level degenerative disc disease is identified throughout the thoracic spine. IMPRESSION: 1. Left lower lobe pulmonary nodule is highly is worrisome for primary bronchogenic carcinoma. Enlarged left hilar lymph node consistent with metastatic adenopathy. Further evaluation with PET-CT and tissue sampling advised. 2. Aortic Atherosclerosis (ICD10-I70.0) and Emphysema (ICD10-J43.9). 3. Multi vessel coronary artery calcification. Electronically Signed   By: Kerby Moors M.D.   On: 06/12/2017 11:51   Mm Screening Breast Tomo Bilateral  Result Date: 05/20/2017 CLINICAL DATA:  Screening. EXAM: 2D DIGITAL SCREENING BILATERAL MAMMOGRAM WITH CAD AND ADJUNCT TOMO COMPARISON:  Previous exam(s). ACR Breast Density Category b: There are scattered areas of fibroglandular density. FINDINGS: There are no findings suspicious for malignancy. Images were processed with CAD. IMPRESSION: No mammographic evidence of malignancy. A result letter of this screening  mammogram will be mailed directly to the patient. RECOMMENDATION: Screening mammogram in one year. (Code:SM-B-01Y) BI-RADS CATEGORY  1: Negative. Electronically Signed   By: Everlean Alstrom M.D.   On: 05/20/2017 12:22    ASSESSMENT:  Pancytopenia, Renal cell carcinoma of right kidney, now with new left lower lobe lung mass.  Plan:  1.  Left lower lobe lung mass: Highly suspicious for underlying malignancy.  This is possibly a second primary, but metastatic renal cell carcinoma is also a possibility.  Patient has a PET scan scheduled for next week for further evaluation.  A referral was also made to pulmonology for possible bronchoscopy and biopsy.  Patient will return to clinic in 1-2 weeks after her biopsy to discuss the results and treatment planning. 2.  Pancytopenia: Previously, bone marrow biopsy revealed no evidence of MDS or other abnormality.  It is possible it is related to her Plaquenil or rheumatoid arthritis. The remainder of her laboratory work was either negative or within normal limits. No intervention is needed at this time.  3. Renal cell carcinoma of the right kidney: Status post partial nephrectomy. Continue follow-up with urology as indicated. 4. Rheumatoid arthritis: Continue Plaquenil. Continue follow-up per rheumatology.   Approximately 60 minutes was spent in discussion of which greater than 50% was consultation.  Patient expressed understanding and was in agreement with this plan. She also understands that She can call clinic at any time with any questions, concerns, or complaints.    Lloyd Huger, MD   06/18/2017 12:33 PM

## 2017-06-19 ENCOUNTER — Encounter: Payer: Self-pay | Admitting: Internal Medicine

## 2017-06-19 ENCOUNTER — Ambulatory Visit (INDEPENDENT_AMBULATORY_CARE_PROVIDER_SITE_OTHER): Payer: Medicare Other | Admitting: Internal Medicine

## 2017-06-19 VITALS — BP 152/98 | HR 75 | Resp 16 | Ht 62.0 in | Wt 143.0 lb

## 2017-06-19 DIAGNOSIS — I70219 Atherosclerosis of native arteries of extremities with intermittent claudication, unspecified extremity: Secondary | ICD-10-CM | POA: Diagnosis not present

## 2017-06-19 DIAGNOSIS — J449 Chronic obstructive pulmonary disease, unspecified: Secondary | ICD-10-CM

## 2017-06-19 DIAGNOSIS — H401132 Primary open-angle glaucoma, bilateral, moderate stage: Secondary | ICD-10-CM | POA: Diagnosis not present

## 2017-06-19 NOTE — Patient Instructions (Signed)
--  We will review the results of your PET scan next week, and then come up with a plan to see what the best way to approach this lesion is.

## 2017-06-20 LAB — CUP PACEART INCLINIC DEVICE CHECK
Brady Statistic AP VS Percent: 57.38 %
Brady Statistic AS VP Percent: 0.07 %
Brady Statistic AS VS Percent: 42 %
Date Time Interrogation Session: 20181113172342
Implantable Lead Implant Date: 20171124
Implantable Lead Location: 753859
Lead Channel Impedance Value: 399 Ohm
Lead Channel Impedance Value: 437 Ohm
Lead Channel Pacing Threshold Amplitude: 0.5 V
Lead Channel Pacing Threshold Amplitude: 0.75 V
Lead Channel Pacing Threshold Pulse Width: 0.4 ms
Lead Channel Sensing Intrinsic Amplitude: 14.5 mV
Lead Channel Setting Pacing Amplitude: 2.5 V
Lead Channel Setting Pacing Pulse Width: 0.4 ms
Lead Channel Setting Sensing Sensitivity: 0.9 mV
MDC IDC LEAD IMPLANT DT: 20171124
MDC IDC LEAD LOCATION: 753860
MDC IDC MSMT BATTERY REMAINING LONGEVITY: 116 mo
MDC IDC MSMT BATTERY VOLTAGE: 3.03 V
MDC IDC MSMT LEADCHNL RA IMPEDANCE VALUE: 361 Ohm
MDC IDC MSMT LEADCHNL RA PACING THRESHOLD PULSEWIDTH: 0.4 ms
MDC IDC MSMT LEADCHNL RA SENSING INTR AMPL: 2.5 mV
MDC IDC MSMT LEADCHNL RV IMPEDANCE VALUE: 513 Ohm
MDC IDC PG IMPLANT DT: 20171124
MDC IDC SET LEADCHNL RA PACING AMPLITUDE: 2 V
MDC IDC STAT BRADY AP VP PERCENT: 0.55 %
MDC IDC STAT BRADY RA PERCENT PACED: 57.63 %
MDC IDC STAT BRADY RV PERCENT PACED: 0.61 %

## 2017-06-24 ENCOUNTER — Encounter
Admission: RE | Admit: 2017-06-24 | Discharge: 2017-06-24 | Disposition: A | Discharge status: Home / Self Care | Payer: Medicare Other | Source: Ambulatory Visit | Attending: Urology | Admitting: Urology

## 2017-06-24 ENCOUNTER — Telehealth: Payer: Self-pay | Admitting: *Deleted

## 2017-06-24 ENCOUNTER — Ambulatory Visit: Payer: Medicare Other

## 2017-06-24 DIAGNOSIS — R918 Other nonspecific abnormal finding of lung field: Secondary | ICD-10-CM | POA: Diagnosis not present

## 2017-06-24 DIAGNOSIS — R911 Solitary pulmonary nodule: Secondary | ICD-10-CM | POA: Diagnosis not present

## 2017-06-24 LAB — GLUCOSE, CAPILLARY: Glucose-Capillary: 74 mg/dL (ref 65–99)

## 2017-06-24 MED ORDER — FLUDEOXYGLUCOSE F - 18 (FDG) INJECTION
13.1200 | Freq: Once | INTRAVENOUS | Status: AC | PRN
  Administered 2017-06-24: 13.12 via INTRAVENOUS

## 2017-06-24 NOTE — Telephone Encounter (Signed)
-----   Message from Laverle Hobby, MD sent at 06/24/2017  3:24 PM EST ----- Informed pt of PET results via her cell #. Explained that we are going to set up a CT guided needle biopsy. Misty, please order, thanks.

## 2017-06-24 NOTE — Telephone Encounter (Signed)
Order placed for CT guided BX per DR.

## 2017-06-25 ENCOUNTER — Encounter: Payer: Self-pay | Admitting: Internal Medicine

## 2017-06-29 ENCOUNTER — Other Ambulatory Visit: Payer: Self-pay | Admitting: Internal Medicine

## 2017-06-30 ENCOUNTER — Telehealth: Payer: Self-pay | Admitting: *Deleted

## 2017-06-30 NOTE — Telephone Encounter (Signed)
Pt given appt for biopsy scheduled on 12/4 at 11am. Instructed to arrive at 10am. Follow up appt with Dr. Grayland Ormond scheduled for 12/11 at 10:30am. Pt aware of all appts and verbalized understanding.

## 2017-07-03 ENCOUNTER — Ambulatory Visit: Payer: Medicare Other | Attending: Internal Medicine

## 2017-07-03 DIAGNOSIS — J449 Chronic obstructive pulmonary disease, unspecified: Secondary | ICD-10-CM | POA: Insufficient documentation

## 2017-07-03 MED ORDER — ALBUTEROL SULFATE (2.5 MG/3ML) 0.083% IN NEBU
2.5000 mg | INHALATION_SOLUTION | Freq: Once | RESPIRATORY_TRACT | Status: AC
  Administered 2017-07-03: 2.5 mg via RESPIRATORY_TRACT
  Filled 2017-07-03: qty 3

## 2017-07-07 ENCOUNTER — Other Ambulatory Visit: Payer: Self-pay | Admitting: Radiology

## 2017-07-07 ENCOUNTER — Telehealth: Payer: Self-pay | Admitting: Internal Medicine

## 2017-07-07 NOTE — Telephone Encounter (Signed)
Sure, I spoke with the patient and reviewed further instructions regarding biopsy scheduled for tomorrow. Pt verbalized understanding and has no further questions or needs at this time.   Thanks!

## 2017-07-07 NOTE — Telephone Encounter (Signed)
No, I don't know what the instructions would be. Caroline Nichols

## 2017-07-07 NOTE — Telephone Encounter (Signed)
Caroline Alamin,  Do you know of any instructions pt needs to know for her biopsy tomorrow. I know you called to inform her but I am not sure if there is anything additional she needs to know other than what time to be there. Thanks

## 2017-07-07 NOTE — Telephone Encounter (Signed)
Do you know if there any special instructions for a biopsy. Haley from [facility] called pt but pt is unaware of any instructions.

## 2017-07-07 NOTE — Telephone Encounter (Signed)
Pt calling stating she is having an biopsy in the morning   She is calling stating she only has the date and no further instructions to go along with it. She is not sure if she needs to stop taking her medications or if she can take them  Would like a call back

## 2017-07-08 ENCOUNTER — Ambulatory Visit
Admission: RE | Admit: 2017-07-08 | Discharge: 2017-07-08 | Disposition: A | Discharge status: Home / Self Care | Payer: Medicare Other | Source: Ambulatory Visit | Attending: Internal Medicine | Admitting: Internal Medicine

## 2017-07-08 DIAGNOSIS — Z841 Family history of disorders of kidney and ureter: Secondary | ICD-10-CM | POA: Insufficient documentation

## 2017-07-08 DIAGNOSIS — Z885 Allergy status to narcotic agent status: Secondary | ICD-10-CM | POA: Insufficient documentation

## 2017-07-08 DIAGNOSIS — I10 Essential (primary) hypertension: Secondary | ICD-10-CM | POA: Insufficient documentation

## 2017-07-08 DIAGNOSIS — K219 Gastro-esophageal reflux disease without esophagitis: Secondary | ICD-10-CM | POA: Diagnosis not present

## 2017-07-08 DIAGNOSIS — Z888 Allergy status to other drugs, medicaments and biological substances status: Secondary | ICD-10-CM | POA: Insufficient documentation

## 2017-07-08 DIAGNOSIS — Z87891 Personal history of nicotine dependence: Secondary | ICD-10-CM | POA: Diagnosis not present

## 2017-07-08 DIAGNOSIS — M419 Scoliosis, unspecified: Secondary | ICD-10-CM | POA: Diagnosis not present

## 2017-07-08 DIAGNOSIS — M1990 Unspecified osteoarthritis, unspecified site: Secondary | ICD-10-CM | POA: Insufficient documentation

## 2017-07-08 DIAGNOSIS — Z950 Presence of cardiac pacemaker: Secondary | ICD-10-CM | POA: Diagnosis not present

## 2017-07-08 DIAGNOSIS — Z79890 Hormone replacement therapy (postmenopausal): Secondary | ICD-10-CM | POA: Insufficient documentation

## 2017-07-08 DIAGNOSIS — E785 Hyperlipidemia, unspecified: Secondary | ICD-10-CM | POA: Diagnosis not present

## 2017-07-08 DIAGNOSIS — Z8249 Family history of ischemic heart disease and other diseases of the circulatory system: Secondary | ICD-10-CM | POA: Insufficient documentation

## 2017-07-08 DIAGNOSIS — K589 Irritable bowel syndrome without diarrhea: Secondary | ICD-10-CM | POA: Diagnosis not present

## 2017-07-08 DIAGNOSIS — Z823 Family history of stroke: Secondary | ICD-10-CM | POA: Insufficient documentation

## 2017-07-08 DIAGNOSIS — Z833 Family history of diabetes mellitus: Secondary | ICD-10-CM | POA: Insufficient documentation

## 2017-07-08 DIAGNOSIS — Z79899 Other long term (current) drug therapy: Secondary | ICD-10-CM | POA: Diagnosis not present

## 2017-07-08 DIAGNOSIS — Z923 Personal history of irradiation: Secondary | ICD-10-CM | POA: Insufficient documentation

## 2017-07-08 DIAGNOSIS — C7A8 Other malignant neuroendocrine tumors: Secondary | ICD-10-CM | POA: Diagnosis not present

## 2017-07-08 DIAGNOSIS — R911 Solitary pulmonary nodule: Secondary | ICD-10-CM | POA: Diagnosis not present

## 2017-07-08 DIAGNOSIS — C3492 Malignant neoplasm of unspecified part of left bronchus or lung: Secondary | ICD-10-CM | POA: Diagnosis not present

## 2017-07-08 DIAGNOSIS — Z905 Acquired absence of kidney: Secondary | ICD-10-CM | POA: Diagnosis not present

## 2017-07-08 DIAGNOSIS — D649 Anemia, unspecified: Secondary | ICD-10-CM | POA: Insufficient documentation

## 2017-07-08 DIAGNOSIS — R918 Other nonspecific abnormal finding of lung field: Secondary | ICD-10-CM

## 2017-07-08 DIAGNOSIS — Z90710 Acquired absence of both cervix and uterus: Secondary | ICD-10-CM | POA: Diagnosis not present

## 2017-07-08 DIAGNOSIS — I480 Paroxysmal atrial fibrillation: Secondary | ICD-10-CM | POA: Insufficient documentation

## 2017-07-08 DIAGNOSIS — Z85528 Personal history of other malignant neoplasm of kidney: Secondary | ICD-10-CM | POA: Diagnosis not present

## 2017-07-08 DIAGNOSIS — E890 Postprocedural hypothyroidism: Secondary | ICD-10-CM | POA: Diagnosis not present

## 2017-07-08 DIAGNOSIS — Z87442 Personal history of urinary calculi: Secondary | ICD-10-CM | POA: Diagnosis not present

## 2017-07-08 DIAGNOSIS — Z887 Allergy status to serum and vaccine status: Secondary | ICD-10-CM | POA: Insufficient documentation

## 2017-07-08 LAB — CBC
HCT: 35.5 % (ref 35.0–47.0)
Hemoglobin: 11.8 g/dL — ABNORMAL LOW (ref 12.0–16.0)
MCH: 30 pg (ref 26.0–34.0)
MCHC: 33.3 g/dL (ref 32.0–36.0)
MCV: 90.2 fL (ref 80.0–100.0)
PLATELETS: 148 10*3/uL — AB (ref 150–440)
RBC: 3.94 MIL/uL (ref 3.80–5.20)
RDW: 15.9 % — AB (ref 11.5–14.5)
WBC: 3.1 10*3/uL — AB (ref 3.6–11.0)

## 2017-07-08 LAB — PROTIME-INR
INR: 1.14
Prothrombin Time: 14.5 seconds (ref 11.4–15.2)

## 2017-07-08 LAB — APTT: APTT: 32 s (ref 24–36)

## 2017-07-08 MED ORDER — FENTANYL CITRATE (PF) 100 MCG/2ML IJ SOLN
INTRAMUSCULAR | Status: AC
Start: 2017-07-08 — End: 2017-07-08
  Filled 2017-07-08: qty 4

## 2017-07-08 MED ORDER — MIDAZOLAM HCL 2 MG/2ML IJ SOLN
INTRAMUSCULAR | Status: AC | PRN
  Administered 2017-07-08: 1 mg via INTRAVENOUS
  Administered 2017-07-08 (×2): 0.5 mg via INTRAVENOUS

## 2017-07-08 MED ORDER — HYDRALAZINE HCL 20 MG/ML IJ SOLN
INTRAMUSCULAR | Status: AC
  Filled 2017-07-08: qty 1

## 2017-07-08 MED ORDER — LIDOCAINE HCL 1 % IJ SOLN
INTRAMUSCULAR | Status: AC | PRN
  Administered 2017-07-08: 10 mL via INTRADERMAL

## 2017-07-08 MED ORDER — HYDRALAZINE HCL 20 MG/ML IJ SOLN
INTRAMUSCULAR | Status: AC
  Administered 2017-07-08: 10 mg via INTRAVENOUS
  Filled 2017-07-08: qty 1

## 2017-07-08 MED ORDER — SODIUM CHLORIDE 0.9 % IV SOLN
INTRAVENOUS | Status: DC
  Administered 2017-07-08: 11:00:00 via INTRAVENOUS

## 2017-07-08 MED ORDER — HYDRALAZINE HCL 20 MG/ML IJ SOLN
INTRAMUSCULAR | Status: AC
  Administered 2017-07-08: 10 mg
  Filled 2017-07-08: qty 1

## 2017-07-08 MED ORDER — MIDAZOLAM HCL 2 MG/2ML IJ SOLN
INTRAMUSCULAR | Status: AC
  Filled 2017-07-08: qty 4

## 2017-07-08 MED ORDER — FENTANYL CITRATE (PF) 100 MCG/2ML IJ SOLN
INTRAMUSCULAR | Status: AC | PRN
  Administered 2017-07-08: 50 ug via INTRAVENOUS
  Administered 2017-07-08 (×2): 25 ug via INTRAVENOUS

## 2017-07-08 MED ORDER — HYDRALAZINE HCL 20 MG/ML IJ SOLN
10.0000 mg | INTRAMUSCULAR | Status: AC
  Administered 2017-07-08: 10 mg via INTRAVENOUS
  Filled 2017-07-08: qty 0.5

## 2017-07-08 NOTE — H&P (Signed)
Chief Complaint: LLL lung biopsy  Referring Physician: Dr. Laverle Hobby  Supervising Physician: Aletta Edouard  Patient Status: [facility] - Out-pt  HPI: Caroline Nichols is a 76 y.o. female who was diagnosed with clear cell renal carcinoma of the right kidney.  She underwent a partial nephrectomy last March with clear margins.  On her one year follow up scan she was found to have a LLL pulmonary nodule.  She underwent a PET scan that revealed hypermetabolic activity.  She has been referred for a biopsy.  The patient denies any CP, SOB, fevers, or chills, etc.  She does admit to HTN for which she is on 3 medications.  Normally at home her BP is in the 140s/80-90s.  Past Medical History:  Past Medical History:  Diagnosis Date   Anemia 02/2013   F/U with Dr. Grayland Ormond for Feraheme injections   Anxiety    Arthritis    Possible RA - Follow up appt with Dr. Jefm Bryant   Cellulitis of arm, right    Diabetes insipidus ([place])    On desmopressin   GERD (gastroesophageal reflux disease)    H/O seasonal allergies    H/O transfusion of packed red blood cells 02/2013   3 units PRBC for severe anemia 02/2013   Herniated disc    History of kidney stones    Hyperlipidemia    Hypertension    Hyperthyroidism    s/p radiation therapy, now hypothyroidism   IBS (irritable bowel syndrome)    Migraine    Pacemaker    a. s/p successful implantation of a Medtronic CapSureFix Novus MRI SureScan serial # [id number] on 06/28/16 by Dr. Caryl Comes for sinus node dysfunction.   Pancreatic cyst    Paroxysmal A-fib [facility])    Renal cell cancer, right ([place]) 10/23/2015   Right partial nephrectomy.    Past Surgical History:  Past Surgical History:  Procedure Laterality Date   ABDOMINAL HYSTERECTOMY     BREAST EXCISIONAL BIOPSY Right 2005   neg   BREAST SURGERY  1990s   Biopsy   CERVICAL SPINE SURGERY     Bone fusion   COLONOSCOPY  2008?   [place]   COLONOSCOPY WITH PROPOFOL  N/A 10/15/2016   Procedure: COLONOSCOPY WITH PROPOFOL;  Surgeon: Robert Bellow, MD;  Location: [facility] ENDOSCOPY;  Service: Endoscopy;  Laterality: N/A;   EP IMPLANTABLE DEVICE N/A 06/28/2016   Procedure: Pacemaker Implant;  Surgeon: Deboraha Sprang, MD;  Location: [facility];  Service: Cardiovascular;  Laterality: N/A;   HAMMER TOE SURGERY     HERNIA REPAIR Left    Inguinal Hernia Repair   ROBOTIC ASSITED PARTIAL NEPHRECTOMY Right 10/23/2015   Procedure: ROBOTIC ASSITED PARTIAL NEPHRECTOMY with intraop ultrasound;  Surgeon: Hollice Espy, MD;  Location: [facility] ORS;  Service: Urology;  Laterality: Right;   TUBAL LIGATION      Family History:  Family History  Problem Relation Age of Onset   Heart disease Mother    Heart disease Father    Diabetes Brother    Kidney disease Brother    Stroke Daughter        due to medication reaction   Breast cancer Cousin    Prostate cancer Neg Hx    Hematuria Neg Hx     Social History:  reports that she quit smoking about 4 years ago. Her smoking use included cigarettes. She has a 15.00 pack-year smoking history. she has never used smokeless tobacco. She reports that she does not  drink alcohol or use drugs.  Allergies:  Allergies  Allergen Reactions   Adhesive [Tape] Other (See Comments)    "irritate skin"   Codeine     REACTION: NAUSEA   Morphine And Related Other (See Comments)    Shut down her organs   Tetanus Toxoid     REACTION: ARM SWELLING,REDNESS   Tramadol Nausea And Vomiting    Medications: Medications revealed in epic  Please HPI for pertinent positives, otherwise complete 10 system ROS negative.  Mallampati Score: MD Evaluation Airway: WNL Heart: Other (comments) Heart  comments: pacemaker in LUC Abdomen: WNL Chest/ Lungs: WNL ASA  Classification: 3 Mallampati/Airway Score: Two  Physical Exam: Temp 98 F (36.7 C) (Oral)  There is no height or weight on file to calculate BMI. General:  pleasant, WD, WN black female who is laying in bed in NAD, but clearly nervous HEENT: head is normocephalic, atraumatic.  Sclera are noninjected.  PERRL.  Ears and nose without any masses or lesions.  Mouth is pink and moist Heart: regular, rate, and rhythm.  Normal s1,s2. No obvious murmurs, gallops, or rubs noted.  Palpable radial pulses bilaterally.  Pacemaker in left upper chest Lungs: CTAB, no wheezes, rhonchi, or rales noted.  Respiratory effort nonlabored Abd: soft, NT, ND, +BS, no masses, hernias, or organomegaly Psych: A&Ox3 with an appropriate affect.   Labs: Results for orders placed or performed during the hospital encounter of 07/08/17 (from the past 48 hour(s))  APTT upon arrival     Status: None   Collection Time: 07/08/17 10:31 AM  Result Value Ref Range   aPTT 32 24 - 36 seconds  CBC upon arrival     Status: Abnormal   Collection Time: 07/08/17 10:31 AM  Result Value Ref Range   WBC 3.1 (L) 3.6 - 11.0 K/uL   RBC 3.94 3.80 - 5.20 MIL/uL   Hemoglobin 11.8 (L) 12.0 - 16.0 g/dL   HCT 35.5 35.0 - 47.0 %   MCV 90.2 80.0 - 100.0 fL   MCH 30.0 26.0 - 34.0 pg   MCHC 33.3 32.0 - 36.0 g/dL   RDW 15.9 (H) 11.5 - 14.5 %   Platelets 148 (L) 150 - 440 K/uL  Protime-INR upon arrival     Status: None   Collection Time: 07/08/17 10:31 AM  Result Value Ref Range   Prothrombin Time 14.5 11.4 - 15.2 seconds   INR 1.14     Imaging: No results found.  Assessment/Plan 1. LLL pulmonary nodule  The patient's present BP was 210/105.  She has received 66m total of hydralazine and it has brought her pressure down to hopefully a safe enough range to proceed with the biopsy.  Given her history of clear cell renal carcinoma, we have given extra attention to her BP as she is at higher risk of bleeding if this were to be a met from her renal CA as opposed to a primary lung nodule.  Her labs and other vitals have been reviewed as well.  At this time, we will proceed with her biopsy. Risks and  benefits discussed with the patient including, but not limited to bleeding, hemoptysis, respiratory failure requiring intubation, infection, pneumothorax requiring chest tube placement, stroke from air embolism or even death. All of the patient's questions were answered, patient is agreeable to proceed. Consent signed and in chart.  Thank you for this interesting consult.  I greatly enjoyed meeting Caroline FIUMARAand look forward to participating in their care.  A copy  of this report was sent to the requesting provider on this date.  Electronically Signed: Henreitta Cea 07/08/2017, 11:25 AM   I spent a total of  30 Minutes   in face to face in clinical consultation, greater than 50% of which was counseling/coordinating care for LLL lung nodule

## 2017-07-08 NOTE — Procedures (Signed)
Interventional Radiology Procedure Note  Procedure: CT guided core biopsy of LLL lung nodule  Complications: Pulmonary hemorrhage deep to nodule  Estimated Blood Loss: < 10 mL  Findings: 2.5 cm LLL lung nodule sampled via 17 G needle with 18 G core biopsy x 2.  Some parenchymal hemorrhage deep to nodule on completion.  No hemoptysis or pneumothorax.  Venetia Night. Kathlene Cote, M.D Pager:  [phone]

## 2017-07-08 NOTE — OR Nursing (Signed)
Caroline Billings PA discussing case with pt/. Hydrazaline given for elevated BP.

## 2017-07-08 NOTE — Discharge Instructions (Signed)
Moderate Conscious Sedation, Adult, Care After These instructions provide you with information about caring for yourself after your procedure. Your health care provider may also give you more specific instructions. Your treatment has been planned according to current medical practices, but problems sometimes occur. Call your health care provider if you have any problems or questions after your procedure. What can I expect after the procedure? After your procedure, it is common:  To feel sleepy for several hours.  To feel clumsy and have poor balance for several hours.  To have poor judgment for several hours.  To vomit if you eat too soon.  Follow these instructions at home: For at least 24 hours after the procedure:   Do not: ? Participate in activities where you could fall or become injured. ? Drive. ? Use heavy machinery. ? Drink alcohol. ? Take sleeping pills or medicines that cause drowsiness. ? Make important decisions or sign legal documents. ? Take care of children on your own.  Rest. Eating and drinking  Follow the diet recommended by your health care provider.  If you vomit: ? Drink water, juice, or soup when you can drink without vomiting. ? Make sure you have little or no nausea before eating solid foods. General instructions  Have a responsible adult stay with you until you are awake and alert.  Take over-the-counter and prescription medicines only as told by your health care provider.  If you smoke, do not smoke without supervision.  Keep all follow-up visits as told by your health care provider. This is important. Contact a health care provider if:  You keep feeling nauseous or you keep vomiting.  You feel light-headed.  You develop a rash.  You have a fever. Get help right away if:  You have trouble breathing. This information is not intended to replace advice given to you by your health care provider. Make sure you discuss any questions you have  with your health care provider. Document Released: 05/12/2013 Document Revised: 12/25/2015 Document Reviewed: 11/11/2015 Elsevier Interactive Patient Education  2018 [employer]. Needle Biopsy of the Lung, Care After This sheet gives you information about how to care for yourself after your procedure. Your health care provider may also give you more specific instructions. If you have problems or questions, contact your health care provider. What can I expect after the procedure? After the procedure, it is common to have:  Soreness, pain, and tenderness where a tissue sample was taken (biopsy site).  A cough.  A sore throat.  Follow these instructions at home: Biopsy site care  Follow instructions from your health care provider about when to remove the bandage that was placed on the biopsy site.  Keep the bandage dry until it has been removed.  Check your biopsy site every day for signs of infection. Check for: ? More redness, swelling, or pain. ? More fluid or blood. ? Warmth to the touch. ? Pus or a bad smell. General instructions  Rest as directed by your health care provider. Ask your health care provider what activities are safe for you.  Do not take baths, swim, or use a hot tub until your health care provider approves.  Take over-the-counter and prescription medicines only as told by your health care provider.  If you have airplane travel scheduled, talk with your health care provider about when it is safe for you to travel by airplane.  It is up to you to get the results of your procedure. Ask your health care  provider, or the department that is doing the procedure, when your results will be ready.  Keep all follow-up visits as told by your health care provider. This is important. Contact a health care provider if:  You have more redness, swelling, or pain around your biopsy site.  You have more fluid or blood coming from your biopsy site.  Your biopsy site feels  warm to the touch.  You have pus or a bad smell coming from your biopsy site.  You have a fever.  You have pain that does not get better with medicine. Get help right away if:  You have problems breathing.  You have chest pain.  You cough up blood.  You faint.  You have a fast heart rate. Summary  After a needle biopsy of the lung, it is common to have a cough, a sore throat, or soreness, pain, and tenderness where a tissue sample was taken (biopsy site).  You should check your biopsy area every day for signs of infection, including pus or a bad smell, warmth, more fluid or blood, or more redness, swelling, or pain.  You should not take baths, swim, or use a hot tub until your health care provider approves.  It is up to you to get the results of your procedure. Ask your health care provider, or the department that is doing the procedure, when your results will be ready. This information is not intended to replace advice given to you by your health care provider. Make sure you discuss any questions you have with your health care provider. Document Released: 05/19/2007 Document Revised: 06/12/2016 Document Reviewed: 06/12/2016 Elsevier Interactive Patient Education  2017 [employer].

## 2017-07-11 ENCOUNTER — Other Ambulatory Visit: Payer: Self-pay | Admitting: Pathology

## 2017-07-11 LAB — SURGICAL PATHOLOGY

## 2017-07-13 DIAGNOSIS — I4891 Unspecified atrial fibrillation: Secondary | ICD-10-CM | POA: Diagnosis not present

## 2017-07-13 DIAGNOSIS — I10 Essential (primary) hypertension: Secondary | ICD-10-CM | POA: Diagnosis not present

## 2017-07-13 DIAGNOSIS — S8002XA Contusion of left knee, initial encounter: Secondary | ICD-10-CM | POA: Diagnosis not present

## 2017-07-13 DIAGNOSIS — Z950 Presence of cardiac pacemaker: Secondary | ICD-10-CM | POA: Diagnosis not present

## 2017-07-13 DIAGNOSIS — Z79899 Other long term (current) drug therapy: Secondary | ICD-10-CM | POA: Diagnosis not present

## 2017-07-13 DIAGNOSIS — S0003XA Contusion of scalp, initial encounter: Secondary | ICD-10-CM | POA: Diagnosis not present

## 2017-07-13 DIAGNOSIS — M542 Cervicalgia: Secondary | ICD-10-CM | POA: Diagnosis not present

## 2017-07-13 DIAGNOSIS — I2510 Atherosclerotic heart disease of native coronary artery without angina pectoris: Secondary | ICD-10-CM | POA: Diagnosis not present

## 2017-07-13 DIAGNOSIS — W19XXXA Unspecified fall, initial encounter: Secondary | ICD-10-CM | POA: Diagnosis not present

## 2017-07-13 DIAGNOSIS — S62310A Displaced fracture of base of second metacarpal bone, right hand, initial encounter for closed fracture: Secondary | ICD-10-CM | POA: Diagnosis not present

## 2017-07-13 DIAGNOSIS — S060X0A Concussion without loss of consciousness, initial encounter: Secondary | ICD-10-CM | POA: Diagnosis not present

## 2017-07-15 ENCOUNTER — Telehealth: Payer: Self-pay | Admitting: Urology

## 2017-07-15 ENCOUNTER — Inpatient Hospital Stay: Payer: Medicare Other | Admitting: Oncology

## 2017-07-15 ENCOUNTER — Other Ambulatory Visit: Payer: Self-pay

## 2017-07-15 MED ORDER — MIRABEGRON ER 50 MG PO TB24
50.0000 mg | ORAL_TABLET | Freq: Every day | ORAL | 2 refills | Status: DC
Start: 2017-07-15 — End: 2017-12-01

## 2017-07-15 NOTE — Telephone Encounter (Signed)
Pt called office LMOM stating that she is out of town and needs a refill sent to a pharmacy where she is visiting. Pt states she will give you name of pharmacy when you return her call. Please advise. Thanks.

## 2017-07-15 NOTE — Progress Notes (Signed)
Pt called requesting a rx of myrbetriq as she is out of town due to a recent surgery.

## 2017-07-16 ENCOUNTER — Ambulatory Visit: Payer: Medicare Other

## 2017-07-16 DIAGNOSIS — C7A8 Other malignant neuroendocrine tumors: Secondary | ICD-10-CM | POA: Insufficient documentation

## 2017-07-16 DIAGNOSIS — C3490 Malignant neoplasm of unspecified part of unspecified bronchus or lung: Secondary | ICD-10-CM | POA: Insufficient documentation

## 2017-07-16 NOTE — Telephone Encounter (Signed)
Medication was refilled yesterday.

## 2017-07-16 NOTE — Progress Notes (Signed)
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  HISTORY OF PRESENT ILLNESS:  Patient returns to clinic today for further evaluation, discussion of her biopsy results, and treatment planning.  She recently had a fall and had significant facial trauma as well as a broken right forearm.  She has significant ecchymoses throughout her face.  She continues to have pain related to her fall, but otherwise feels well. She has no neurologic complaints.  She denies any recent fevers or illnesses. She has a good appetite and denies weight loss.  She denies any chest pain, cough, hemoptysis, or shortness of breath. She denies any nausea, vomiting, constipation, or diarrhea. She has no urinary complaints. Patient offers no further specific complaints today.   REVIEW OF SYSTEMS:    Review of Systems  Constitutional: Negative.  Negative for fever, malaise/fatigue and weight loss.  HENT: Negative.   Eyes: Positive for pain. Negative for blurred vision and double vision.  Respiratory: Negative.  Negative for cough and shortness of breath.   Cardiovascular: Negative.  Negative for chest pain and leg swelling.  Gastrointestinal: Negative.   Genitourinary: Negative.   Musculoskeletal: Positive for falls.  Skin: Negative.  Negative for rash.  Neurological: Negative.  Negative for sensory change and weakness.  Psychiatric/Behavioral: The patient is nervous/anxious.   All other systems reviewed and are negative.   PAST MEDICAL HISTORY: Past Medical History:  Diagnosis Date   Anemia 02/2013   F/U  with Dr. Grayland Ormond for Feraheme injections   Anxiety    Arthritis    Possible RA - Follow up appt with Dr. Jefm Bryant   Cellulitis of arm, right    Diabetes insipidus ([place])    On desmopressin   GERD (gastroesophageal reflux disease)    H/O seasonal allergies    H/O transfusion of packed red blood cells 02/2013   3 units PRBC for severe anemia 02/2013   Herniated disc    History of kidney stones    Hyperlipidemia    Hypertension    Hyperthyroidism    s/p radiation therapy, now hypothyroidism   IBS (irritable bowel syndrome)    Migraine    Pacemaker    a. s/p successful implantation of a Medtronic CapSureFix Novus MRI SureScan serial # [id number] on 06/28/16 by Dr. Caryl Comes for sinus node dysfunction.   Pancreatic cyst    Paroxysmal A-fib [facility])    Renal cell cancer, right ([place]) 10/23/2015   Right partial nephrectomy.    PAST SURGICAL HISTORY: Past Surgical History:  Procedure Laterality Date   ABDOMINAL HYSTERECTOMY     BREAST EXCISIONAL BIOPSY Right 2005   neg   BREAST SURGERY  1990s   Biopsy   CERVICAL SPINE SURGERY     Bone fusion   COLONOSCOPY  2008?   [place]   COLONOSCOPY WITH PROPOFOL N/A 10/15/2016   Procedure: COLONOSCOPY WITH PROPOFOL;  Surgeon: Robert Bellow, MD;  Location: [facility] ENDOSCOPY;  Service: Endoscopy;  Laterality: N/A;   EP IMPLANTABLE DEVICE N/A 06/28/2016   Procedure: Pacemaker Implant;  Surgeon: Deboraha Sprang, MD;  Location: [facility] LAB;  Service: Cardiovascular;  Laterality: N/A;   HAMMER TOE SURGERY     HERNIA REPAIR Left    Inguinal Hernia Repair   ROBOTIC ASSITED PARTIAL NEPHRECTOMY Right 10/23/2015   Procedure: ROBOTIC ASSITED PARTIAL NEPHRECTOMY with intraop ultrasound;  Surgeon: Hollice Espy, MD;  Location: [facility] ORS;  Service: Urology;  Laterality: Right;   TUBAL LIGATION      FAMILY HISTORY Family History  Problem Relation Age of Onset   Heart disease Mother    Heart disease Father     Diabetes Brother    Kidney disease Brother    Stroke Daughter        due to medication reaction   Breast cancer Cousin    Prostate cancer Neg Hx    Hematuria Neg Hx     ADVANCED DIRECTIVES:  No flowsheet data found.  HEALTH MAINTENANCE: Social History   Tobacco Use   Smoking status: Former Smoker    Packs/day: 0.50    Years: 30.00    Pack years: 15.00    Types: Cigarettes    Last attempt to quit: 12/03/2012    Years since quitting: 4.6   Smokeless tobacco: Never Used  Substance Use Topics   Alcohol use: No    Alcohol/week: 0.0 oz   Drug use: No     Allergies  Allergen Reactions   Metrizamide     Other reaction(s): Other (See Comments)   Adhesive [Tape] Other (See Comments)    "irritate skin"   Codeine     REACTION: NAUSEA   Morphine And Related Other (See Comments)    Shut down her organs   Tetanus Toxoid     REACTION: ARM SWELLING,REDNESS   Tramadol Nausea And Vomiting    Current Outpatient Medications  Medication Sig Dispense Refill   acetaminophen (TYLENOL) 325 MG tablet Take 2 tablets (650 mg total) by mouth every 6 (six) hours as needed for pain.     amLODipine (NORVASC) 10 MG tablet Take by mouth.     brimonidine (ALPHAGAN) 0.2 % ophthalmic solution Place 1 drop into both eyes 2 (two) times daily.     Calcium-Vitamin D (SUPER CALCIUM/D) 600-125 MG-UNIT TABS Take 2 tablets by mouth daily.      carvedilol (COREG) 6.25 MG tablet Take 1 tablet (6.25 mg total) 2 (two) times daily by mouth. 180 tablet 3   Cholecalciferol (D3-1000 PO) Take 1,000 Units by mouth daily. Reported on 07/28/2015     dorzolamide (TRUSOPT) 2 % ophthalmic solution      ferrous sulfate 325 (65 FE) MG tablet Take 325 mg by mouth daily with breakfast.     folic acid (FOLVITE) 1 MG tablet Take by mouth.     gabapentin (NEURONTIN) 300 MG capsule TAKE 1 CAPSULE (300 MG TOTAL) BY MOUTH 3 (THREE) TIMES DAILY. AS NEEDED FOR SHINGLES PAIN 90 capsule 1   hydrALAZINE  (APRESOLINE) 10 MG tablet Take by mouth.     hydroxychloroquine (PLAQUENIL) 200 MG tablet Take 400 mg by mouth daily.      latanoprost (XALATAN) 0.005 % ophthalmic solution Place 1 drop into both eyes at bedtime.      levothyroxine (SYNTHROID, LEVOTHROID) 112 MCG tablet TAKE 1 TABLET  BY MOUTH DAILY BEFORE BREAKFAST. 90 tablet 0   losartan (COZAAR) 100 MG tablet TAKE ONE TABLET BY MOUTH DAILY. NOTE DOSE CHANGED TO 100MG!! 90 tablet 1   mirabegron ER (MYRBETRIQ) 50 MG TB24 tablet Take 1 tablet (50 mg total) by mouth daily. 30 tablet 2   mirtazapine (REMERON) 7.5 MG  tablet TAKE 1 TABLET (7.5 MG TOTAL) BY MOUTH AT BEDTIME. 30 tablet 0   nitroGLYCERIN (NITROLINGUAL) 0.4 MG/SPRAY spray Place 1 spray under the tongue every 5 (five) minutes x 3 doses as needed for chest pain. 4.9 g 0   Omega-3 Fatty Acids (FISH OIL) 1000 MG CAPS Take 1,000 mg by mouth daily.      pantoprazole (PROTONIX) 40 MG tablet TAKE ONE TABLET BY MOUTH DAILY 90 tablet 0   vitamin C (ASCORBIC ACID) 500 MG tablet Take 500 mg by mouth daily.     ALPRAZolam (XANAX) 0.25 MG tablet TAKE ONE TABLET BY MOUTH TWICE A DAY AS NEEDED FOR ANXIETY 60 tablet 1   No current facility-administered medications for this visit.     OBJECTIVE:  There were no vitals filed for this visit.   There is no height or weight on file to calculate BMI.    ECOG FS:0 - Asymptomatic  Physical Exam   General: Well-developed, well-nourished, no acute distress. Eyes: Pink conjunctiva, anicteric sclera. HEENT: Swelling and ecchymosis surrounding both eyes. Lungs: Clear to auscultation bilaterally. Heart: Regular rate and rhythm. No rubs, murmurs, or gallops. Abdomen: Soft, nontender, nondistended. No organomegaly noted, normoactive bowel sounds. Musculoskeletal: No edema, cyanosis, or clubbing.  Right arm in splint secondary to fracture. Neuro: Alert, answering all questions appropriately. Cranial nerves grossly intact. Skin: No rashes or petechiae  noted. Psych: Normal affect.   LAB RESULTS:  Appointment on 07/18/2017  Component Date Value Ref Range Status   Sodium 07/18/2017 143  135 - 145 mmol/L Final   Potassium 07/18/2017 3.3* 3.5 - 5.1 mmol/L Final   Chloride 07/18/2017 107  101 - 111 mmol/L Final   CO2 07/18/2017 27  22 - 32 mmol/L Final   Glucose, Bld 07/18/2017 117* 65 - 99 mg/dL Final   BUN 07/18/2017 12  6 - 20 mg/dL Final   Creatinine, Ser 07/18/2017 0.76  0.44 - 1.00 mg/dL Final   Calcium 07/18/2017 9.2  8.9 - 10.3 mg/dL Final   Total Protein 07/18/2017 7.9  6.5 - 8.1 g/dL Final   Albumin 07/18/2017 3.8  3.5 - 5.0 g/dL Final   AST 07/18/2017 38  15 - 41 U/L Final   ALT 07/18/2017 25  14 - 54 U/L Final   Alkaline Phosphatase 07/18/2017 58  38 - 126 U/L Final   Total Bilirubin 07/18/2017 0.4  0.3 - 1.2 mg/dL Final   GFR calc non Af Amer 07/18/2017 >60  >60 mL/min Final   GFR calc Af Amer 07/18/2017 >60  >60 mL/min Final   Comment: (NOTE) The eGFR has been calculated using the CKD EPI equation. This calculation has not been validated in all clinical situations. eGFR's persistently <60 mL/min signify possible Chronic Kidney Disease.    Anion gap 07/18/2017 9  5 - 15 Final       STUDIES: Nm Pet Image Initial (pi) Skull Base To Thigh  Result Date: 06/24/2017 CLINICAL DATA:  Initial treatment strategy for pulmonary nodules. EXAM: NUCLEAR MEDICINE PET SKULL BASE TO THIGH TECHNIQUE: 13.12 mCi F-18 FDG was injected intravenously. Full-ring PET imaging was performed from the skull base to thigh after the radiotracer. CT data was obtained and used for attenuation correction and anatomic localization. FASTING BLOOD GLUCOSE:  Value: 74 mg/dl COMPARISON:  Chest CT 06/12/2017 FINDINGS: NECK: No neck mass or lymphadenopathy. There is slight asymmetric activity noted in the right fossa of [place]. No abnormalities seen on CT scan and this is likely mild inflammatory change. CHEST: 23  mm left lower lobe  pulmonary nodule on image number 87 is hypermetabolic with SUV max of 86.7. Adjacent nodes left hilar lymph node on image number 83 is also hypermetabolic with SUV max of 8.6. Findings consistent with primary lung neoplasm and metastatic left hilar adenopathy. No enlarged or hypermetabolic mediastinal lymph nodes. No other lung lesions are identified. ABDOMEN/PELVIS: No abnormal hypermetabolic activity within the liver, pancreas, adrenal glands, or spleen. No hypermetabolic lymph nodes in the abdomen or pelvis. SKELETON: No focal hypermetabolic activity to suggest skeletal metastasis. IMPRESSION: 1. Hypermetabolic left lower lobe pulmonary lesion consistent with primary lung neoplasm. Associated enlarged and hypermetabolic left hilar lymph node. 2. No mediastinal adenopathy or metastatic disease elsewhere. Electronically Signed   By: Marijo Sanes M.D.   On: 06/24/2017 14:17   Ct Biopsy  Result Date: 07/08/2017 CLINICAL DATA:  Left lower lobe lung nodule measuring approximately 2.3 cm an demonstrating hypermetabolic activity by PET scan. Associated hypermetabolic left hilar lymph node. History of renal carcinoma resection in 2017. EXAM: CT GUIDED CORE BIOPSY OF LEFT LOWER LOBE LUNG NODULE ANESTHESIA/SEDATION: 2.0 mg IV Versed; 100 mcg IV Fentanyl Total Moderate Sedation Time:  25 minutes. The patient's level of consciousness and physiologic status were continuously monitored during the procedure by Radiology nursing. PROCEDURE: The procedure risks, benefits, and alternatives were explained to the patient. Questions regarding the procedure were encouraged and answered. The patient understands and consents to the procedure. A time-out was performed prior to initiating the procedure. CT was performed through the lower lung zones in a prone position. The left posterolateral chest wall was prepped with chlorhexidine in a sterile fashion, and a sterile drape was applied covering the operative field. A sterile gown and  sterile gloves were used for the procedure. Local anesthesia was provided with 1% Lidocaine. After localizing a left lower lobe lung nodule, a 17 gauge needle was advanced under CT guidance into the nodule. Two separate coaxial 18 gauge core biopsy samples were obtained. Additional CT was performed. During needle removal, the Biosentry device was utilized in depositing a hydrogel plug at the pleural entry site. COMPLICATIONS: Focal parenchymal hemorrhage deep to the nodule following biopsy. SIR level A: No therapy, no consequence. FINDINGS: Ovoid left lower lobe nodule localized by CT. This shows some probable interval enlargement since prior imaging and measures 1.8 x 2.7 cm. Solid tissue was obtained. After the second biopsy, there was evidence of some parenchymal hemorrhage deep to the nodule. The patient was asymptomatic and did not have any hemoptysis during the procedure or immediately following. After outer needle removal, there is no evidence of pneumothorax. IMPRESSION: CT-guided core biopsy performed of left lower lobe lung nodule now measuring 2.7 cm in greatest diameter. The procedure was complicated by some parenchymal hemorrhage in the adjacent lung. The patient was asymptomatic and will be followed during recovery. Electronically Signed   By: Aletta Edouard M.D.   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  Plan:  1. 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  Patient will benefit from concurrent chemotherapy and XRT.  Recent CT scan of her head after her fall did not reveal metastatic disease.  Patient will require a port placement.  Patient has requested to delay her treatment in order to recover from her recent fall, therefore she will return to clinic on August 06, 2017 initiate cycle 1 of 4  of carboplatinum and etoposide.  She will receive carboplatinum instead of cisplatin given her history of a partial nephrectomy.  Patient will receive carboplatinum and etoposide on day 1 and then etoposide only on days 2 and 3.  Will consider Neulasta support in future after XRT is complete.  Her consultation with radiation oncology will occur on July 28, 2017.  Return to clinic on August 06, 2017 to initiate cycle 1, day 1 of treatment. 2.  Pancytopenia: Previously, bone marrow biopsy revealed no evidence of MDS or other abnormality.  It is possible it is related to her Plaquenil or rheumatoid arthritis. The remainder of her laboratory work was either negative or within normal limits. No intervention is needed at this time.  3. Renal cell carcinoma of the right kidney: Status post partial nephrectomy. Continue follow-up with urology as indicated. 4. Rheumatoid arthritis: Continue Plaquenil. Continue follow-up per rheumatology.  5.  Fracture right forearm: Continue follow-up with orthopedics as indicated.  Approximately 30 minutes was spent in discussion of which greater than 50% was consultation.  Patient expressed understanding and was in agreement with this plan. She also understands that She can call clinic at any time with any questions, concerns, or complaints.    Lloyd Huger, MD   07/20/2017 1:57 PM

## 2017-07-17 ENCOUNTER — Other Ambulatory Visit: Payer: Self-pay | Admitting: *Deleted

## 2017-07-17 ENCOUNTER — Other Ambulatory Visit (INDEPENDENT_AMBULATORY_CARE_PROVIDER_SITE_OTHER): Payer: Self-pay

## 2017-07-17 DIAGNOSIS — C3490 Malignant neoplasm of unspecified part of unspecified bronchus or lung: Secondary | ICD-10-CM

## 2017-07-18 ENCOUNTER — Inpatient Hospital Stay: Payer: Medicare Other | Attending: Oncology | Admitting: Oncology

## 2017-07-18 ENCOUNTER — Other Ambulatory Visit: Payer: Self-pay | Admitting: Internal Medicine

## 2017-07-18 ENCOUNTER — Encounter: Payer: Self-pay | Admitting: Internal Medicine

## 2017-07-18 ENCOUNTER — Ambulatory Visit (INDEPENDENT_AMBULATORY_CARE_PROVIDER_SITE_OTHER): Payer: Medicare Other | Admitting: Internal Medicine

## 2017-07-18 ENCOUNTER — Inpatient Hospital Stay: Payer: Medicare Other

## 2017-07-18 VITALS — BP 148/74 | HR 83 | Temp 98.5°F | Resp 15 | Ht 62.0 in | Wt 140.1 lb

## 2017-07-18 DIAGNOSIS — I10 Essential (primary) hypertension: Secondary | ICD-10-CM

## 2017-07-18 DIAGNOSIS — D61818 Other pancytopenia: Secondary | ICD-10-CM | POA: Insufficient documentation

## 2017-07-18 DIAGNOSIS — Z87891 Personal history of nicotine dependence: Secondary | ICD-10-CM | POA: Insufficient documentation

## 2017-07-18 DIAGNOSIS — K219 Gastro-esophageal reflux disease without esophagitis: Secondary | ICD-10-CM

## 2017-07-18 DIAGNOSIS — C7A8 Other malignant neuroendocrine tumors: Secondary | ICD-10-CM | POA: Insufficient documentation

## 2017-07-18 DIAGNOSIS — Z87442 Personal history of urinary calculi: Secondary | ICD-10-CM | POA: Diagnosis not present

## 2017-07-18 DIAGNOSIS — I70219 Atherosclerosis of native arteries of extremities with intermittent claudication, unspecified extremity: Secondary | ICD-10-CM | POA: Diagnosis not present

## 2017-07-18 DIAGNOSIS — M069 Rheumatoid arthritis, unspecified: Secondary | ICD-10-CM | POA: Diagnosis not present

## 2017-07-18 DIAGNOSIS — Z79899 Other long term (current) drug therapy: Secondary | ICD-10-CM | POA: Insufficient documentation

## 2017-07-18 DIAGNOSIS — E232 Diabetes insipidus: Secondary | ICD-10-CM | POA: Diagnosis not present

## 2017-07-18 DIAGNOSIS — Z905 Acquired absence of kidney: Secondary | ICD-10-CM | POA: Diagnosis not present

## 2017-07-18 DIAGNOSIS — C641 Malignant neoplasm of right kidney, except renal pelvis: Secondary | ICD-10-CM | POA: Insufficient documentation

## 2017-07-18 DIAGNOSIS — R5383 Other fatigue: Secondary | ICD-10-CM

## 2017-07-18 DIAGNOSIS — I480 Paroxysmal atrial fibrillation: Secondary | ICD-10-CM | POA: Diagnosis not present

## 2017-07-18 DIAGNOSIS — C3490 Malignant neoplasm of unspecified part of unspecified bronchus or lung: Secondary | ICD-10-CM

## 2017-07-18 DIAGNOSIS — S5291XD Unspecified fracture of right forearm, subsequent encounter for closed fracture with routine healing: Secondary | ICD-10-CM | POA: Diagnosis not present

## 2017-07-18 DIAGNOSIS — S060X0D Concussion without loss of consciousness, subsequent encounter: Secondary | ICD-10-CM

## 2017-07-18 DIAGNOSIS — E785 Hyperlipidemia, unspecified: Secondary | ICD-10-CM | POA: Insufficient documentation

## 2017-07-18 DIAGNOSIS — W19XXXD Unspecified fall, subsequent encounter: Secondary | ICD-10-CM | POA: Insufficient documentation

## 2017-07-18 LAB — COMPREHENSIVE METABOLIC PANEL
ALT: 25 U/L (ref 14–54)
ANION GAP: 9 (ref 5–15)
AST: 38 U/L (ref 15–41)
Albumin: 3.8 g/dL (ref 3.5–5.0)
Alkaline Phosphatase: 58 U/L (ref 38–126)
BUN: 12 mg/dL (ref 6–20)
CHLORIDE: 107 mmol/L (ref 101–111)
CO2: 27 mmol/L (ref 22–32)
Calcium: 9.2 mg/dL (ref 8.9–10.3)
Creatinine, Ser: 0.76 mg/dL (ref 0.44–1.00)
Glucose, Bld: 117 mg/dL — ABNORMAL HIGH (ref 65–99)
POTASSIUM: 3.3 mmol/L — AB (ref 3.5–5.1)
Sodium: 143 mmol/L (ref 135–145)
TOTAL PROTEIN: 7.9 g/dL (ref 6.5–8.1)
Total Bilirubin: 0.4 mg/dL (ref 0.3–1.2)

## 2017-07-18 MED ORDER — CYANOCOBALAMIN 1000 MCG/ML IJ SOLN
1000.0000 ug | Freq: Once | INTRAMUSCULAR | Status: AC
  Administered 2017-07-18: 1000 ug via INTRAMUSCULAR

## 2017-07-18 NOTE — Telephone Encounter (Signed)
Refilled: 04/21/2017 Last OV: 07/18/2017 Next OV: 10/16/2017

## 2017-07-18 NOTE — Progress Notes (Signed)
Subjective:  Patient ID: Caroline Nichols, female    DOB: 1940-08-17  Age: 76 y.o. MRN: [id number]  CC: The primary encounter diagnosis was Fatigue, unspecified type. Diagnoses of Concussion without loss of consciousness, subsequent encounter and Essential hypertension were also pertinent to this visit.  HPI [name] presents for ER follow up.  Patient was treated on Dec 12 for concussion and fracture of right wrist which she sustained during a witnessed fall at home,  Head CT C spine and plain films of right wrist were done at  [facility].  Her wrist was splinted and bandaged and she was referred to orthopedics.  Her niece has noticed more bruising and swelling around the face over the last few days.   An incidental finding of a lung mass was found in early December and she underwent a lung biopsy on Dec 4.   History of renal cell CA, 2017,   History of Tobacco abuse  Lung mass on chest x ray.  Had Lung biopsy done Dec 4  : had not received the results from [facility] because the office was closed .  large cell neuroendocrine carcinoma ,  Porta cath to be placed Dec 17 to start chemo   Has ortho next Tuesday, appt with Finnegan 11:45 today  HTN medications reviewed :   Hydralazine dose 10 mg started once daily  amlodipine 10 mg onc daily  Carvedilol dose increased to 6.25  Losartan 100 mg   Outpatient Medications Prior to Visit  Medication Sig Dispense Refill  . acetaminophen (TYLENOL) 325 MG tablet Take 2 tablets (650 mg total) by mouth every 6 (six) hours as needed for pain.    Marland Kitchen amLODipine (NORVASC) 10 MG tablet Take by mouth.    . brimonidine (ALPHAGAN) 0.2 % ophthalmic solution Place 1 drop into both eyes 2 (two) times daily.    . Calcium-Vitamin D (SUPER CALCIUM/D) 600-125 MG-UNIT TABS Take 2 tablets by mouth daily.     . carvedilol (COREG) 6.25 MG tablet Take 1 tablet (6.25 mg total) 2 (two) times daily by mouth. 180 tablet 3  . Cholecalciferol (D3-1000 PO) Take  1,000 Units by mouth daily. Reported on 07/28/2015    . dorzolamide (TRUSOPT) 2 % ophthalmic solution     . ferrous sulfate 325 (65 FE) MG tablet Take 325 mg by mouth daily with breakfast.    . folic acid (FOLVITE) 1 MG tablet Take by mouth.    . gabapentin (NEURONTIN) 300 MG capsule TAKE 1 CAPSULE (300 MG TOTAL) BY MOUTH 3 (THREE) TIMES DAILY. AS NEEDED FOR SHINGLES PAIN 90 capsule 1  . hydrALAZINE (APRESOLINE) 10 MG tablet Take by mouth.    . hydroxychloroquine (PLAQUENIL) 200 MG tablet Take 400 mg by mouth daily.     Marland Kitchen latanoprost (XALATAN) 0.005 % ophthalmic solution Place 1 drop into both eyes at bedtime.     Marland Kitchen levothyroxine (SYNTHROID, LEVOTHROID) 112 MCG tablet TAKE 1 TABLET  BY MOUTH DAILY BEFORE BREAKFAST. 90 tablet 0  . losartan (COZAAR) 100 MG tablet TAKE ONE TABLET BY MOUTH DAILY. NOTE DOSE CHANGED TO 100MG !! 90 tablet 1  . mirabegron ER (MYRBETRIQ) 50 MG TB24 tablet Take 1 tablet (50 mg total) by mouth daily. 30 tablet 2  . mirtazapine (REMERON) 7.5 MG tablet TAKE 1 TABLET (7.5 MG TOTAL) BY MOUTH AT BEDTIME. 30 tablet 0  . nitroGLYCERIN (NITROLINGUAL) 0.4 MG/SPRAY spray Place 1 spray under the tongue every 5 (five) minutes x 3 doses  as needed for chest pain. 4.9 g 0  . Omega-3 Fatty Acids (FISH OIL) 1000 MG CAPS Take 1,000 mg by mouth daily.     . pantoprazole (PROTONIX) 40 MG tablet TAKE ONE TABLET BY MOUTH DAILY 90 tablet 0  . vitamin C (ASCORBIC ACID) 500 MG tablet Take 500 mg by mouth daily.    Marland Kitchen ALPRAZolam (XANAX) 0.25 MG tablet TAKE ONE TABLET BY MOUTH TWICE A DAY AS NEEDED FOR ANXIETY 60 tablet 2   No facility-administered medications prior to visit.     Review of Systems;  Patient denies headache, fevers, malaise, unintentional weight loss, skin rash, eye pain, sinus congestion and sinus pain, sore throat, dysphagia,  hemoptysis , cough, dyspnea, wheezing, chest pain, palpitations, orthopnea, edema, abdominal pain, nausea, melena, diarrhea, constipation, flank pain,  dysuria, hematuria, urinary  Frequency, nocturia, numbness, tingling, seizures,  Focal weakness, Loss of consciousness,  Tremor, insomnia, depression, anxiety, and suicidal ideation.      Objective:  BP (!) 148/74 (BP Location: Left Arm, Patient Position: Sitting, Cuff Size: Normal)   Pulse 83   Temp 98.5 F (36.9 C) (Oral)   Resp 15   Ht 5\' 2"  (1.575 m)   Wt 140 lb 1.9 oz (63.6 kg)   SpO2 95%   BMI 25.63 kg/m   BP Readings from Last 3 Encounters:  07/18/17 (!) 148/74  07/08/17 (!) 160/81  06/19/17 (!) 152/98    Wt Readings from Last 3 Encounters:  07/18/17 140 lb 1.9 oz (63.6 kg)  06/19/17 143 lb (64.9 kg)  06/17/17 141 lb 8 oz (64.2 kg)    General appearance: alert, cooperative and appears stated age Ears: normal TM's and external ear canals both ears Throat: lips, mucosa, and tongue normal; teeth and gums normal Neck: no adenopathy, no carotid bruit, supple, symmetrical, trachea midline and thyroid not enlarged, symmetric, no tenderness/mass/nodules Back: symmetric, no curvature. ROM normal. No CVA tenderness. Lungs: clear to auscultation bilaterally Heart: regular rate and rhythm, S1, S2 normal, no murmur, click, rub or gallop Abdomen: soft, non-tender; bowel sounds normal; no masses,  no organomegaly Pulses: 2+ and symmetric Skin: Skin color, texture, turgor normal. No rashes or lesions Lymph nodes: Cervical, supraclavicular, and axillary nodes normal.  Lab Results  Component Value Date   HGBA1C 5.3 06/09/2014   HGBA1C 5.2 12/27/2012    Lab Results  Component Value Date   CREATININE 0.76 07/18/2017   CREATININE 0.70 06/12/2017   CREATININE 0.90 06/06/2017    Lab Results  Component Value Date   WBC 3.1 (L) 07/08/2017   HGB 11.8 (L) 07/08/2017   HCT 35.5 07/08/2017   PLT 148 (L) 07/08/2017   GLUCOSE 117 (H) 07/18/2017   CHOL 167 01/11/2015   TRIG 72.0 01/11/2015   HDL 54.50 01/11/2015   LDLCALC 98 01/11/2015   ALT 25 07/18/2017   AST 38 07/18/2017     NA 143 07/18/2017   K 3.3 (L) 07/18/2017   CL 107 07/18/2017   CREATININE 0.76 07/18/2017   BUN 12 07/18/2017   CO2 27 07/18/2017   TSH 1.15 01/16/2017   INR 1.14 07/08/2017   HGBA1C 5.3 06/09/2014   MICROALBUR 0.7 06/09/2014    Ct Biopsy  Result Date: 07/08/2017 CLINICAL DATA:  Left lower lobe lung nodule measuring approximately 2.3 cm an demonstrating hypermetabolic activity by PET scan. Associated hypermetabolic left hilar lymph node. History of renal carcinoma resection in 2017. EXAM: CT GUIDED CORE BIOPSY OF LEFT LOWER LOBE LUNG NODULE ANESTHESIA/SEDATION: 2.0 mg IV Versed;  100 mcg IV Fentanyl Total Moderate Sedation Time:  25 minutes. The patient's level of consciousness and physiologic status were continuously monitored during the procedure by Radiology nursing. PROCEDURE: The procedure risks, benefits, and alternatives were explained to the patient. Questions regarding the procedure were encouraged and answered. The patient understands and consents to the procedure. A time-out was performed prior to initiating the procedure. CT was performed through the lower lung zones in a prone position. The left posterolateral chest wall was prepped with chlorhexidine in a sterile fashion, and a sterile drape was applied covering the operative field. A sterile gown and sterile gloves were used for the procedure. Local anesthesia was provided with 1% Lidocaine. After localizing a left lower lobe lung nodule, a 17 gauge needle was advanced under CT guidance into the nodule. Two separate coaxial 18 gauge core biopsy samples were obtained. Additional CT was performed. During needle removal, the Biosentry device was utilized in depositing a hydrogel plug at the pleural entry site. COMPLICATIONS: Focal parenchymal hemorrhage deep to the nodule following biopsy. SIR level A: No therapy, no consequence. FINDINGS: Ovoid left lower lobe nodule localized by CT. This shows some probable interval enlargement since  prior imaging and measures 1.8 x 2.7 cm. Solid tissue was obtained. After the second biopsy, there was evidence of some parenchymal hemorrhage deep to the nodule. The patient was asymptomatic and did not have any hemoptysis during the procedure or immediately following. After outer needle removal, there is no evidence of pneumothorax. IMPRESSION: CT-guided core biopsy performed of left lower lobe lung nodule now measuring 2.7 cm in greatest diameter. The procedure was complicated by some parenchymal hemorrhage in the adjacent lung. The patient was asymptomatic and will be followed during recovery. Electronically Signed   By: Aletta Edouard M.D.   On: 07/08/2017 14:07    Assessment & Plan:   Problem List Items Addressed This Visit    Fatigue - Primary   Relevant Medications   cyanocobalamin ((VITAMIN B-12)) injection 1,000 mcg (Completed)   Concussion    Neurology exam is normal.  Facial swelling without facial bone pain on exam.        Essential hypertension    Improved control with increased dose of carvedilol, but not at goal.  Adding hydralazine 10 mg tid.   Lab Results  Component Value Date   CREATININE 0.76 07/18/2017   Lab Results  Component Value Date   NA 143 07/18/2017   K 3.3 (L) 07/18/2017   CL 107 07/18/2017   CO2 27 07/18/2017           A total of 25 minutes of face to face time was spent with patient more than half of which was spent in counselling about the above mentioned conditions  and coordination of care    I am having [name] start on potassium chloride SA. I am also having her maintain her Fish Oil, Calcium-Vitamin D, acetaminophen, vitamin C, ferrous sulfate, Cholecalciferol (D3-1000 PO), brimonidine, hydroxychloroquine, latanoprost, nitroGLYCERIN, amLODipine, folic acid, hydrALAZINE, gabapentin, dorzolamide, losartan, levothyroxine, carvedilol, pantoprazole, mirtazapine, and mirabegron ER. We administered cyanocobalamin.  Meds ordered this  encounter  Medications  . cyanocobalamin ((VITAMIN B-12)) injection 1,000 mcg  . potassium chloride SA (K-DUR,KLOR-CON) 20 MEQ tablet    Sig: Take 1 tablet (20 mEq total) by mouth daily.    Dispense:  30 tablet    Refill:  3    There are no discontinued medications.  Follow-up: No Follow-up on file.   Aris Everts  Derrel Nip, MD

## 2017-07-18 NOTE — Progress Notes (Signed)
START ON PATHWAY REGIMEN - Small Cell Lung     A cycle is every 21 days:     Etoposide      Carboplatin   **Always confirm dose/schedule in your pharmacy ordering system**    Patient Characteristics: Limited Stage, First Line Stage Classification: Limited AJCC T Category: T1c AJCC N Category: N1 AJCC M Category: M0 AJCC 8 Stage Grouping: IIB Line of therapy: First Line Would you be surprised if this patient died  in the next year<= I would be surprised if this patient died in the next year Intent of Therapy: Curative Intent, Discussed with Patient

## 2017-07-18 NOTE — Patient Instructions (Addendum)
Your lung biopsy was positive for cancer,  That's why they hospital called you to set up the porta cath  "large cell neuroendocrine " carcinoma.  Dr Grayland Ormond will discuss this with you today  For your blood pressre   I recommend continuiing the losartan ,  Amlodipine,  And carvedilol,  And adding hydralazine 10 mg three times  Daily to get your BP under 130/80    You are in my prayers and those of [place]

## 2017-07-18 NOTE — Telephone Encounter (Signed)
Printed, signed and faxed.  

## 2017-07-20 DIAGNOSIS — S060X9A Concussion with loss of consciousness of unspecified duration, initial encounter: Secondary | ICD-10-CM | POA: Insufficient documentation

## 2017-07-20 DIAGNOSIS — S060XAA Concussion with loss of consciousness status unknown, initial encounter: Secondary | ICD-10-CM | POA: Insufficient documentation

## 2017-07-20 MED ORDER — CEFAZOLIN SODIUM-DEXTROSE 1-4 GM/50ML-% IV SOLN: 1.0000 g | Freq: Once | INTRAVENOUS | Status: DC

## 2017-07-20 MED ORDER — LIDOCAINE-PRILOCAINE 2.5-2.5 % EX CREA: TOPICAL_CREAM | CUTANEOUS | 3 refills | Status: DC

## 2017-07-20 MED ORDER — POTASSIUM CHLORIDE CRYS ER 20 MEQ PO TBCR: 20.0000 meq | EXTENDED_RELEASE_TABLET | Freq: Every day | ORAL | 3 refills | Status: DC

## 2017-07-20 MED ORDER — PROCHLORPERAZINE MALEATE 10 MG PO TABS: 10.0000 mg | ORAL_TABLET | Freq: Four times a day (QID) | ORAL | 2 refills | Status: DC | PRN

## 2017-07-20 MED ORDER — ONDANSETRON HCL 8 MG PO TABS: 8.0000 mg | ORAL_TABLET | Freq: Two times a day (BID) | ORAL | 2 refills | Status: DC | PRN

## 2017-07-20 NOTE — Assessment & Plan Note (Addendum)
Improved control with increased dose of carvedilol, but not at goal.  Adding hydralazine 10 mg tid.   Lab Results  Component Value Date   CREATININE 0.76 07/18/2017   Lab Results  Component Value Date   NA 143 07/18/2017   K 3.3 (L) 07/18/2017   CL 107 07/18/2017   CO2 27 07/18/2017

## 2017-07-20 NOTE — Assessment & Plan Note (Signed)
Neurology exam is normal.  Facial swelling without facial bone pain on exam.

## 2017-07-21 ENCOUNTER — Encounter: Admission: RE | Payer: Self-pay | Source: Ambulatory Visit

## 2017-07-21 ENCOUNTER — Other Ambulatory Visit (INDEPENDENT_AMBULATORY_CARE_PROVIDER_SITE_OTHER): Payer: Self-pay | Admitting: Vascular Surgery

## 2017-07-21 ENCOUNTER — Ambulatory Visit: Admission: RE | Admit: 2017-07-21 | Payer: Medicare Other | Source: Ambulatory Visit | Admitting: Vascular Surgery

## 2017-07-21 ENCOUNTER — Telehealth (INDEPENDENT_AMBULATORY_CARE_PROVIDER_SITE_OTHER): Payer: Self-pay | Admitting: Vascular Surgery

## 2017-07-21 SURGERY — PORTA CATH INSERTION
Anesthesia: Moderate Sedation

## 2017-07-21 NOTE — Telephone Encounter (Signed)
New Message  Hailey verbalized pt needing to reschedule Port Cath Removal and needs to contact [place] back to reschedule.   Hailey verbalized she reached out to [place] on Friday but laura is OOO all this week.  Please f/u with Hailey or Patient

## 2017-07-21 NOTE — Telephone Encounter (Signed)
Called the nurse back to let her know that her patient has been taken off of the schedule for today and that she or the patient should call us back to get rescheduled for next week after the holidays or for the first week in January.

## 2017-07-22 ENCOUNTER — Ambulatory Visit
Admission: RE | Admit: 2017-07-22 | Discharge: 2017-07-22 | Disposition: A | Discharge status: Home / Self Care | Payer: Self-pay | Source: Ambulatory Visit | Attending: Urology | Admitting: Urology

## 2017-07-22 ENCOUNTER — Other Ambulatory Visit (INDEPENDENT_AMBULATORY_CARE_PROVIDER_SITE_OTHER): Payer: Self-pay | Admitting: Vascular Surgery

## 2017-07-22 ENCOUNTER — Ambulatory Visit: Payer: Medicare Other

## 2017-07-22 ENCOUNTER — Other Ambulatory Visit: Payer: Self-pay | Admitting: Urology

## 2017-07-22 DIAGNOSIS — S62310A Displaced fracture of base of second metacarpal bone, right hand, initial encounter for closed fracture: Secondary | ICD-10-CM | POA: Diagnosis not present

## 2017-07-22 DIAGNOSIS — Z85528 Personal history of other malignant neoplasm of kidney: Secondary | ICD-10-CM

## 2017-07-22 NOTE — Patient Instructions (Signed)
Etoposide, VP-16 injection What is this medicine? ETOPOSIDE, VP-16 (e toe POE side) is a chemotherapy drug. It is used to treat testicular cancer, lung cancer, and other cancers. This medicine may be used for other purposes; ask your health care provider or pharmacist if you have questions. COMMON BRAND NAME(S): Etopophos, Toposar, VePesid What should I tell my health care provider before I take this medicine? They need to know if you have any of these conditions: -infection -kidney disease -liver disease -low blood counts, like low white cell, platelet, or red cell counts -an unusual or allergic reaction to etoposide, other medicines, foods, dyes, or preservatives -pregnant or trying to get pregnant -breast-feeding How should I use this medicine? This medicine is for infusion into a vein. It is administered in a hospital or clinic by a specially trained health care professional. Talk to your pediatrician regarding the use of this medicine in children. Special care may be needed. Overdosage: If you think you have taken too much of this medicine contact a poison control center or emergency room at once. NOTE: This medicine is only for you. Do not share this medicine with others. What if I miss a dose? It is important not to miss your dose. Call your doctor or health care professional if you are unable to keep an appointment. What may interact with this medicine? -aspirin -certain medications for seizures like carbamazepine, phenobarbital, phenytoin, valproic acid -cyclosporine -levamisole -warfarin This list may not describe all possible interactions. Give your health care provider a list of all the medicines, herbs, non-prescription drugs, or dietary supplements you use. Also tell them if you smoke, drink alcohol, or use illegal drugs. Some items may interact with your medicine. What should I watch for while using this medicine? Visit your doctor for checks on your progress. This drug  may make you feel generally unwell. This is not uncommon, as chemotherapy can affect healthy cells as well as cancer cells. Report any side effects. Continue your course of treatment even though you feel ill unless your doctor tells you to stop. In some cases, you may be given additional medicines to help with side effects. Follow all directions for their use. Call your doctor or health care professional for advice if you get a fever, chills or sore throat, or other symptoms of a cold or flu. Do not treat yourself. This drug decreases your body's ability to fight infections. Try to avoid being around people who are sick. This medicine may increase your risk to bruise or bleed. Call your doctor or health care professional if you notice any unusual bleeding. Talk to your doctor about your risk of cancer. You may be more at risk for certain types of cancers if you take this medicine. Do not become pregnant while taking this medicine or for at least 6 months after stopping it. Women should inform their doctor if they wish to become pregnant or think they might be pregnant. Women of child-bearing potential will need to have a negative pregnancy test before starting this medicine. There is a potential for serious side effects to an unborn child. Talk to your health care professional or pharmacist for more information. Do not breast-feed an infant while taking this medicine. Men must use a latex condom during sexual contact with a woman while taking this medicine and for at least 4 months after stopping it. A latex condom is needed even if you have had a vasectomy. Contact your doctor right away if your partner becomes pregnant. Do   not donate sperm while taking this medicine and for at least 4 months after you stop taking this medicine. Men should inform their doctors if they wish to father a child. This medicine may lower sperm counts. What side effects may I notice from receiving this medicine? Side effects that  you should report to your doctor or health care professional as soon as possible: -allergic reactions like skin rash, itching or hives, swelling of the face, lips, or tongue -low blood counts - this medicine may decrease the number of white blood cells, red blood cells and platelets. You may be at increased risk for infections and bleeding. -signs of infection - fever or chills, cough, sore throat, pain or difficulty passing urine -signs of decreased platelets or bleeding - bruising, pinpoint red spots on the skin, black, tarry stools, blood in the urine -signs of decreased red blood cells - unusually weak or tired, fainting spells, lightheadedness -breathing problems -changes in vision -mouth or throat sores or ulcers -pain, redness, swelling or irritation at the injection site -pain, tingling, numbness in the hands or feet -redness, blistering, peeling or loosening of the skin, including inside the mouth -seizures -vomiting Side effects that usually do not require medical attention (report to your doctor or health care professional if they continue or are bothersome): -diarrhea -hair loss -loss of appetite -nausea -stomach pain This list may not describe all possible side effects. Call your doctor for medical advice about side effects. You may report side effects to FDA at 1-800-FDA-1088. Where should I keep my medicine? This drug is given in a hospital or clinic and will not be stored at home. NOTE: This sheet is a summary. It may not cover all possible information. If you have questions about this medicine, talk to your doctor, pharmacist, or health care provider.  2018 Elsevier/Gold Standard (2015-07-14 11:53:23) Carboplatin injection What is this medicine? CARBOPLATIN (KAR boe pla tin) is a chemotherapy drug. It targets fast dividing cells, like cancer cells, and causes these cells to die. This medicine is used to treat ovarian cancer and many other cancers. This medicine may be  used for other purposes; ask your health care provider or pharmacist if you have questions. COMMON BRAND NAME(S): Paraplatin What should I tell my health care provider before I take this medicine? They need to know if you have any of these conditions: -blood disorders -hearing problems -kidney disease -recent or ongoing radiation therapy -an unusual or allergic reaction to carboplatin, cisplatin, other chemotherapy, other medicines, foods, dyes, or preservatives -pregnant or trying to get pregnant -breast-feeding How should I use this medicine? This drug is usually given as an infusion into a vein. It is administered in a hospital or clinic by a specially trained health care professional. Talk to your pediatrician regarding the use of this medicine in children. Special care may be needed. Overdosage: If you think you have taken too much of this medicine contact a poison control center or emergency room at once. NOTE: This medicine is only for you. Do not share this medicine with others. What if I miss a dose? It is important not to miss a dose. Call your doctor or health care professional if you are unable to keep an appointment. What may interact with this medicine? -medicines for seizures -medicines to increase blood counts like filgrastim, pegfilgrastim, sargramostim -some antibiotics like amikacin, gentamicin, neomycin, streptomycin, tobramycin -vaccines Talk to your doctor or health care professional before taking any of these medicines: -acetaminophen -aspirin -ibuprofen -ketoprofen -naproxen  This list may not describe all possible interactions. Give your health care provider a list of all the medicines, herbs, non-prescription drugs, or dietary supplements you use. Also tell them if you smoke, drink alcohol, or use illegal drugs. Some items may interact with your medicine. What should I watch for while using this medicine? Your condition will be monitored carefully while you are  receiving this medicine. You will need important blood work done while you are taking this medicine. This drug may make you feel generally unwell. This is not uncommon, as chemotherapy can affect healthy cells as well as cancer cells. Report any side effects. Continue your course of treatment even though you feel ill unless your doctor tells you to stop. In some cases, you may be given additional medicines to help with side effects. Follow all directions for their use. Call your doctor or health care professional for advice if you get a fever, chills or sore throat, or other symptoms of a cold or flu. Do not treat yourself. This drug decreases your body's ability to fight infections. Try to avoid being around people who are sick. This medicine may increase your risk to bruise or bleed. Call your doctor or health care professional if you notice any unusual bleeding. Be careful brushing and flossing your teeth or using a toothpick because you may get an infection or bleed more easily. If you have any dental work done, tell your dentist you are receiving this medicine. Avoid taking products that contain aspirin, acetaminophen, ibuprofen, naproxen, or ketoprofen unless instructed by your doctor. These medicines may hide a fever. Do not become pregnant while taking this medicine. Women should inform their doctor if they wish to become pregnant or think they might be pregnant. There is a potential for serious side effects to an unborn child. Talk to your health care professional or pharmacist for more information. Do not breast-feed an infant while taking this medicine. What side effects may I notice from receiving this medicine? Side effects that you should report to your doctor or health care professional as soon as possible: -allergic reactions like skin rash, itching or hives, swelling of the face, lips, or tongue -signs of infection - fever or chills, cough, sore throat, pain or difficulty passing  urine -signs of decreased platelets or bleeding - bruising, pinpoint red spots on the skin, black, tarry stools, nosebleeds -signs of decreased red blood cells - unusually weak or tired, fainting spells, lightheadedness -breathing problems -changes in hearing -changes in vision -chest pain -high blood pressure -low blood counts - This drug may decrease the number of white blood cells, red blood cells and platelets. You may be at increased risk for infections and bleeding. -nausea and vomiting -pain, swelling, redness or irritation at the injection site -pain, tingling, numbness in the hands or feet -problems with balance, talking, walking -trouble passing urine or change in the amount of urine Side effects that usually do not require medical attention (report to your doctor or health care professional if they continue or are bothersome): -hair loss -loss of appetite -metallic taste in the mouth or changes in taste This list may not describe all possible side effects. Call your doctor for medical advice about side effects. You may report side effects to FDA at 1-800-FDA-1088. Where should I keep my medicine? This drug is given in a hospital or clinic and will not be stored at home. NOTE: This sheet is a summary. It may not cover all possible information. If you have  questions about this medicine, talk to your doctor, pharmacist, or health care provider.  2018 Elsevier/Gold Standard (2007-10-27 14:38:05)

## 2017-07-23 ENCOUNTER — Inpatient Hospital Stay: Payer: Medicare Other

## 2017-07-24 ENCOUNTER — Ambulatory Visit
Admission: RE | Admit: 2017-07-24 | Discharge: 2017-07-24 | Disposition: A | Discharge status: Home / Self Care | Payer: Medicare Other | Source: Ambulatory Visit | Attending: Vascular Surgery | Admitting: Vascular Surgery

## 2017-07-24 ENCOUNTER — Encounter: Payer: Self-pay | Admitting: *Deleted

## 2017-07-24 ENCOUNTER — Encounter
Admission: RE | Disposition: A | Discharge status: Home / Self Care | Payer: Self-pay | Source: Ambulatory Visit | Attending: Vascular Surgery

## 2017-07-24 DIAGNOSIS — M1990 Unspecified osteoarthritis, unspecified site: Secondary | ICD-10-CM | POA: Diagnosis not present

## 2017-07-24 DIAGNOSIS — Z887 Allergy status to serum and vaccine status: Secondary | ICD-10-CM | POA: Insufficient documentation

## 2017-07-24 DIAGNOSIS — Z87891 Personal history of nicotine dependence: Secondary | ICD-10-CM | POA: Diagnosis not present

## 2017-07-24 DIAGNOSIS — Z803 Family history of malignant neoplasm of breast: Secondary | ICD-10-CM | POA: Insufficient documentation

## 2017-07-24 DIAGNOSIS — Z905 Acquired absence of kidney: Secondary | ICD-10-CM | POA: Insufficient documentation

## 2017-07-24 DIAGNOSIS — C641 Malignant neoplasm of right kidney, except renal pelvis: Secondary | ICD-10-CM | POA: Insufficient documentation

## 2017-07-24 DIAGNOSIS — Z9889 Other specified postprocedural states: Secondary | ICD-10-CM | POA: Diagnosis not present

## 2017-07-24 DIAGNOSIS — Z885 Allergy status to narcotic agent status: Secondary | ICD-10-CM | POA: Diagnosis not present

## 2017-07-24 DIAGNOSIS — G43909 Migraine, unspecified, not intractable, without status migrainosus: Secondary | ICD-10-CM | POA: Insufficient documentation

## 2017-07-24 DIAGNOSIS — Z823 Family history of stroke: Secondary | ICD-10-CM | POA: Insufficient documentation

## 2017-07-24 DIAGNOSIS — Z79899 Other long term (current) drug therapy: Secondary | ICD-10-CM | POA: Diagnosis not present

## 2017-07-24 DIAGNOSIS — I10 Essential (primary) hypertension: Secondary | ICD-10-CM | POA: Insufficient documentation

## 2017-07-24 DIAGNOSIS — Z87442 Personal history of urinary calculi: Secondary | ICD-10-CM | POA: Diagnosis not present

## 2017-07-24 DIAGNOSIS — I480 Paroxysmal atrial fibrillation: Secondary | ICD-10-CM | POA: Insufficient documentation

## 2017-07-24 DIAGNOSIS — Z981 Arthrodesis status: Secondary | ICD-10-CM | POA: Diagnosis not present

## 2017-07-24 DIAGNOSIS — Z79818 Long term (current) use of other agents affecting estrogen receptors and estrogen levels: Secondary | ICD-10-CM | POA: Insufficient documentation

## 2017-07-24 DIAGNOSIS — C3490 Malignant neoplasm of unspecified part of unspecified bronchus or lung: Secondary | ICD-10-CM

## 2017-07-24 DIAGNOSIS — C7A8 Other malignant neuroendocrine tumors: Secondary | ICD-10-CM | POA: Insufficient documentation

## 2017-07-24 DIAGNOSIS — Z841 Family history of disorders of kidney and ureter: Secondary | ICD-10-CM | POA: Insufficient documentation

## 2017-07-24 DIAGNOSIS — Z90710 Acquired absence of both cervix and uterus: Secondary | ICD-10-CM | POA: Insufficient documentation

## 2017-07-24 DIAGNOSIS — D61818 Other pancytopenia: Secondary | ICD-10-CM | POA: Diagnosis not present

## 2017-07-24 DIAGNOSIS — Z833 Family history of diabetes mellitus: Secondary | ICD-10-CM | POA: Insufficient documentation

## 2017-07-24 DIAGNOSIS — Z8249 Family history of ischemic heart disease and other diseases of the circulatory system: Secondary | ICD-10-CM | POA: Insufficient documentation

## 2017-07-24 DIAGNOSIS — Z888 Allergy status to other drugs, medicaments and biological substances status: Secondary | ICD-10-CM | POA: Diagnosis not present

## 2017-07-24 DIAGNOSIS — E232 Diabetes insipidus: Secondary | ICD-10-CM | POA: Diagnosis not present

## 2017-07-24 DIAGNOSIS — E785 Hyperlipidemia, unspecified: Secondary | ICD-10-CM | POA: Diagnosis not present

## 2017-07-24 DIAGNOSIS — K589 Irritable bowel syndrome without diarrhea: Secondary | ICD-10-CM | POA: Insufficient documentation

## 2017-07-24 DIAGNOSIS — Z955 Presence of coronary angioplasty implant and graft: Secondary | ICD-10-CM | POA: Diagnosis not present

## 2017-07-24 DIAGNOSIS — K219 Gastro-esophageal reflux disease without esophagitis: Secondary | ICD-10-CM | POA: Diagnosis not present

## 2017-07-24 DIAGNOSIS — E079 Disorder of thyroid, unspecified: Secondary | ICD-10-CM | POA: Insufficient documentation

## 2017-07-24 HISTORY — PX: PORTA CATH INSERTION: CATH118285

## 2017-07-24 SURGERY — PORTA CATH INSERTION
Anesthesia: Moderate Sedation

## 2017-07-24 MED ORDER — HEPARIN (PORCINE) IN NACL 2-0.9 UNIT/ML-% IJ SOLN
INTRAMUSCULAR | Status: AC
  Filled 2017-07-24: qty 500

## 2017-07-24 MED ORDER — MIDAZOLAM HCL 5 MG/5ML IJ SOLN
INTRAMUSCULAR | Status: AC
  Filled 2017-07-24: qty 5

## 2017-07-24 MED ORDER — DEXTROSE 5 % IV SOLN
INTRAVENOUS | Status: AC
  Administered 2017-07-24: 1.5 g via INTRAVENOUS
  Filled 2017-07-24: qty 1.5

## 2017-07-24 MED ORDER — HYDRALAZINE HCL 20 MG/ML IJ SOLN
10.0000 mg | Freq: Once | INTRAMUSCULAR | Status: AC
  Administered 2017-07-24: 10 mg via INTRAVENOUS

## 2017-07-24 MED ORDER — MIDAZOLAM HCL 2 MG/2ML IJ SOLN
INTRAMUSCULAR | Status: DC | PRN
  Administered 2017-07-24: 1 mg via INTRAVENOUS
  Administered 2017-07-24: 2 mg via INTRAVENOUS

## 2017-07-24 MED ORDER — SODIUM CHLORIDE 0.9 % IR SOLN
Freq: Once | Status: AC
  Administered 2017-07-24: 11:00:00
  Filled 2017-07-24: qty 2

## 2017-07-24 MED ORDER — LIDOCAINE-EPINEPHRINE (PF) 1 %-1:200000 IJ SOLN
INTRAMUSCULAR | Status: AC
  Filled 2017-07-24: qty 30

## 2017-07-24 MED ORDER — DEXTROSE 5 % IV SOLN
1.5000 g | INTRAVENOUS | Status: AC
  Administered 2017-07-24: 1.5 g via INTRAVENOUS

## 2017-07-24 MED ORDER — SODIUM CHLORIDE 0.9 % IV SOLN
INTRAVENOUS | Status: DC
  Administered 2017-07-24: 11:00:00 via INTRAVENOUS

## 2017-07-24 MED ORDER — FENTANYL CITRATE (PF) 100 MCG/2ML IJ SOLN
INTRAMUSCULAR | Status: DC | PRN
  Administered 2017-07-24: 25 ug via INTRAVENOUS
  Administered 2017-07-24: 50 ug via INTRAVENOUS

## 2017-07-24 MED ORDER — HYDRALAZINE HCL 20 MG/ML IJ SOLN
INTRAMUSCULAR | Status: AC
  Administered 2017-07-24: 10 mg via INTRAVENOUS
  Filled 2017-07-24: qty 1

## 2017-07-24 MED ORDER — FENTANYL CITRATE (PF) 100 MCG/2ML IJ SOLN
INTRAMUSCULAR | Status: AC
  Filled 2017-07-24: qty 2

## 2017-07-24 SURGICAL SUPPLY — 9 items
COVER PROBE U/S 5X48 (MISCELLANEOUS) ×2 IMPLANT
KIT PORT POWER 8FR ISP CVUE (Miscellaneous) ×2 IMPLANT
PACK ANGIOGRAPHY (CUSTOM PROCEDURE TRAY) ×2 IMPLANT
PAD GROUND ADULT SPLIT (MISCELLANEOUS) ×2 IMPLANT
PENCIL ELECTRO HAND CTR (MISCELLANEOUS) ×2 IMPLANT
SUT MNCRL AB 4-0 PS2 18 (SUTURE) ×2 IMPLANT
SUT PROLENE 0 CT 1 30 (SUTURE) ×2 IMPLANT
SUTURE VIC 3-0 (SUTURE) ×2 IMPLANT
TOWEL OR 17X26 4PK STRL BLUE (TOWEL DISPOSABLE) ×2 IMPLANT

## 2017-07-24 NOTE — H&P (Signed)
[facility] History & Physical Update  The patient was interviewed and re-examined.  The patient's previous History and Physical has been reviewed and is unchanged.  There is no change in the plan of care. We plan to proceed with the scheduled procedure.  Leotis Pain, MD  07/24/2017, 9:35 AM

## 2017-07-24 NOTE — Progress Notes (Signed)
Pt BP elevated 198/10. Dr. Lucky Cowboy paged and updated, hydralazine IV 10 mg ordered and given. BP 183/83 at baseline upon discharge.

## 2017-07-24 NOTE — Op Note (Signed)
      [facility]       Operative Note  Date: 07/24/2017  Preoperative diagnosis:  1. Lung cancer  Postoperative diagnosis:  Same as above  Procedures: #1. Ultrasound guidance for vascular access to the right internal jugular vein. #2. Fluoroscopic guidance for placement of catheter. #3. Placement of CT compatible Port-A-Cath, right internal jugular vein.  Surgeon: Leotis Pain, MD.   Anesthesia: Local with moderate conscious sedation for approximately 20  minutes using 3 mg of Versed and 75 mcg of Fentanyl  Fluoroscopy time: less than 1 minute  Contrast used: 0  Estimated blood loss: 5 cc  Indication for the procedure:  The patient is a 76 y.o.female with lung cancer.  The patient needs a Port-A-Cath for durable venous access, chemotherapy, lab draws, and CT scans. We are asked to place this. Risks and benefits were discussed and informed consent was obtained.  Description of procedure: The patient was brought to the vascular and interventional radiology suite.  Moderate conscious sedation was administered throughout the procedure during a face to face encounter with the patient with my supervision of the RN administering medicines and monitoring the patient's vital signs, pulse oximetry, telemetry and mental status throughout from the start of the procedure until the patient was taken to the recovery room. The right neck chest and shoulder were sterilely prepped and draped, and a sterile surgical field was created. Ultrasound was used to help visualize a patent right internal jugular vein. This was then accessed under direct ultrasound guidance without difficulty with the Seldinger needle and a permanent image was recorded. A J-wire was placed. After skin nick and dilatation, the peel-away sheath was then placed over the wire. I then anesthetized an area under the clavicle approximately 1-2 fingerbreadths. A transverse incision was created and an inferior pocket was  created with electrocautery and blunt dissection. The port was then brought onto the field, placed into the pocket and secured to the chest wall with 2 Prolene sutures. The catheter was connected to the port and tunneled from the subclavicular incision to the access site. Fluoroscopic guidance was then used to cut the catheter to an appropriate length. The catheter was then placed through the peel-away sheath and the peel-away sheath was removed. The catheter tip was parked in excellent location under fluorocoscopic guidance in the cavoatrial junction. The pocket was then irrigated with antibiotic impregnated saline and the wound was closed with a running 3-0 Vicryl and a 4-0 Monocryl. The access incision was closed with a single 4-0 Monocryl. The Huber needle was used to withdraw blood and flush the port with heparinized saline. Dermabond was then placed as a dressing. The patient tolerated the procedure well and was taken to the recovery room in stable condition.   Leotis Pain 07/24/2017 11:27 AM   This note was created with Dragon Medical transcription system. Any errors in dictation are purely unintentional.

## 2017-07-25 ENCOUNTER — Other Ambulatory Visit: Payer: Self-pay

## 2017-07-25 ENCOUNTER — Telehealth: Payer: Self-pay | Admitting: Internal Medicine

## 2017-07-25 ENCOUNTER — Ambulatory Visit: Payer: Medicare Other

## 2017-07-25 NOTE — Telephone Encounter (Signed)
Pt's daughter would for pt to see orthopedics for her broken hand. Does pt need a referral?

## 2017-07-25 NOTE — Telephone Encounter (Signed)
Patient asking about medications that were prescribed on 07/20/17 by Dr. Grayland Ormond / Called patient to clarify / She wanted to know about the K-Dur.  Explained that it was written by Dr. Derrel Nip when she was seen on 07/18/17  Patient verbalized understanding./

## 2017-07-25 NOTE — Telephone Encounter (Signed)
Copied from [place]. Topic: Referral - Request >> Jul 25, 2017  9:22 AM Burnis Medin, NT wrote: CRM for notification. See Telephone encounter for: Pt. Daughter called and wanted to see if her mom could get a referral to an orthopedic daughter about her broken hand. She would like a call back  07/25/17.

## 2017-07-25 NOTE — Telephone Encounter (Signed)
Copied from [place] [id number]. Topic: Quick Communication - Rx Refill/Question >> Jul 25, 2017 11:12 AM Marin Olp L wrote: Has the patient contacted their pharmacy? No. (Agent: If no, request that the patient contact the pharmacy for the refill.) Preferred Pharmacy (with phone number or street name): [place], [place] Agent: Please be advised that RX refills may take up to 3 business days. We ask that you follow-up with your pharmacy. Patient wants to know why she was prescribed  lidocaine-prilocaine (EMLA) cream, ondansetron (ZOFRAN) 8 MG tablet, potassium chloride SA (K-DUR,KLOR-CON) 20 MEQ tablet,  prochlorperazine (COMPAZINE) 10 MG tablet on 12/16? Please advise.

## 2017-07-25 NOTE — Telephone Encounter (Signed)
I thought she was already set up with orthopedics,  At her last visit she said she was

## 2017-07-28 ENCOUNTER — Ambulatory Visit: Payer: Medicare Other | Admitting: Radiation Oncology

## 2017-07-30 ENCOUNTER — Other Ambulatory Visit: Payer: Self-pay

## 2017-07-30 ENCOUNTER — Encounter: Payer: Self-pay | Admitting: Radiation Oncology

## 2017-07-30 ENCOUNTER — Ambulatory Visit
Admission: RE | Admit: 2017-07-30 | Discharge: 2017-07-30 | Disposition: A | Discharge status: Home / Self Care | Payer: Medicare Other | Source: Ambulatory Visit | Attending: Radiation Oncology | Admitting: Radiation Oncology

## 2017-07-30 VITALS — BP 133/76 | HR 73 | Temp 98.3°F | Resp 18

## 2017-07-30 DIAGNOSIS — Z85528 Personal history of other malignant neoplasm of kidney: Secondary | ICD-10-CM | POA: Insufficient documentation

## 2017-07-30 DIAGNOSIS — Z79899 Other long term (current) drug therapy: Secondary | ICD-10-CM | POA: Insufficient documentation

## 2017-07-30 DIAGNOSIS — D649 Anemia, unspecified: Secondary | ICD-10-CM | POA: Diagnosis not present

## 2017-07-30 DIAGNOSIS — K589 Irritable bowel syndrome without diarrhea: Secondary | ICD-10-CM | POA: Insufficient documentation

## 2017-07-30 DIAGNOSIS — I480 Paroxysmal atrial fibrillation: Secondary | ICD-10-CM | POA: Diagnosis not present

## 2017-07-30 DIAGNOSIS — Z87891 Personal history of nicotine dependence: Secondary | ICD-10-CM | POA: Insufficient documentation

## 2017-07-30 DIAGNOSIS — E232 Diabetes insipidus: Secondary | ICD-10-CM | POA: Insufficient documentation

## 2017-07-30 DIAGNOSIS — L0390 Cellulitis, unspecified: Secondary | ICD-10-CM | POA: Insufficient documentation

## 2017-07-30 DIAGNOSIS — Z803 Family history of malignant neoplasm of breast: Secondary | ICD-10-CM | POA: Diagnosis not present

## 2017-07-30 DIAGNOSIS — M129 Arthropathy, unspecified: Secondary | ICD-10-CM | POA: Diagnosis not present

## 2017-07-30 DIAGNOSIS — Z8781 Personal history of (healed) traumatic fracture: Secondary | ICD-10-CM | POA: Diagnosis not present

## 2017-07-30 DIAGNOSIS — Z510 Encounter for antineoplastic radiation therapy: Secondary | ICD-10-CM | POA: Insufficient documentation

## 2017-07-30 DIAGNOSIS — E0590 Thyrotoxicosis, unspecified without thyrotoxic crisis or storm: Secondary | ICD-10-CM | POA: Diagnosis not present

## 2017-07-30 DIAGNOSIS — Z9181 History of falling: Secondary | ICD-10-CM | POA: Diagnosis not present

## 2017-07-30 DIAGNOSIS — C7A1 Malignant poorly differentiated neuroendocrine tumors: Secondary | ICD-10-CM | POA: Insufficient documentation

## 2017-07-30 DIAGNOSIS — C7A8 Other malignant neuroendocrine tumors: Secondary | ICD-10-CM

## 2017-07-30 DIAGNOSIS — F419 Anxiety disorder, unspecified: Secondary | ICD-10-CM | POA: Insufficient documentation

## 2017-07-30 DIAGNOSIS — Z87442 Personal history of urinary calculi: Secondary | ICD-10-CM | POA: Insufficient documentation

## 2017-07-30 DIAGNOSIS — I10 Essential (primary) hypertension: Secondary | ICD-10-CM | POA: Diagnosis not present

## 2017-07-30 DIAGNOSIS — E785 Hyperlipidemia, unspecified: Secondary | ICD-10-CM | POA: Insufficient documentation

## 2017-07-30 NOTE — Consult Note (Signed)
NEW PATIENT EVALUATION  Name: Caroline Nichols  MRN: [id number]  Date:   07/30/2017     DOB: 09-Aug-1940   This 76 y.o. 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  REFERRING PHYSICIAN: [name], MD  CHIEF COMPLAINT:  Chief Complaint  Patient presents with  . Cancer    Pt is here for initial consultation of lung cancer.      DIAGNOSIS: The encounter diagnosis was Neuroendocrine carcinoma of lung ([place]).   PREVIOUS INVESTIGATIONS:  PET CT and CT scans reviewed Pathology report reviewed Clinical notes reviewed  HPI: Patient is a 76 year old female recovering from a traumatic fall with significant facial injury as well as fracture of her left forearm. 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 She is also noted to have a left lung mass suspicious for malignancy. PET/CT scan demonstrated hypermetabolic activity in the left lower lobe primary pulmonary lesion as well as hilar adenopathy both consistent with primary bronchogenic carcinoma. No mediastinal or metastatic disease was noted. She underwent a CT-guided biopsy which was positive for large cell neuroendocrine carcinoma. Patient's had a traumatic fall with significant facial injuries as well as as stated above fracture of her left forearm. She's been seen by medical oncology and the plan is for concurrent chemoradiation. She is seen today and doing fairly well. She specifically denies cough hemoptysis or chest tightness.   PLANNED TREATMENT REGIMEN: Hypofractionated IM RT radiation therapy to her chest  PAST MEDICAL HISTORY:  has a past medical history of Anemia (02/2013), Anxiety, Arthritis, Cellulitis of arm, right, Diabetes insipidus ([place]), GERD (gastroesophageal reflux disease), H/O seasonal allergies, H/O transfusion of packed red blood cells (02/2013), Herniated disc, History of kidney stones,  Hyperlipidemia, Hypertension, Hyperthyroidism, IBS (irritable bowel syndrome), Migraine, Pacemaker, Pancreatic cyst, Paroxysmal A-fib ([place]), and Renal cell cancer, right ([place]) (10/23/2015).    PAST SURGICAL HISTORY:  Past Surgical History:  Procedure Laterality Date  . ABDOMINAL HYSTERECTOMY    . BREAST EXCISIONAL BIOPSY Right 2005   neg  . BREAST SURGERY  1990s   Biopsy  . CERVICAL SPINE SURGERY     Bone fusion  . COLONOSCOPY  2008?   [place]  . COLONOSCOPY WITH PROPOFOL N/A 10/15/2016   Procedure: COLONOSCOPY WITH PROPOFOL;  Surgeon: Robert Bellow, MD;  Location: [facility] ENDOSCOPY;  Service: Endoscopy;  Laterality: N/A;  . EP IMPLANTABLE DEVICE N/A 06/28/2016   Procedure: Pacemaker Implant;  Surgeon: Deboraha Sprang, MD;  Location: [facility];  Service: Cardiovascular;  Laterality: N/A;  . HAMMER TOE SURGERY    . HERNIA REPAIR Left    Inguinal Hernia Repair  . PORTA CATH INSERTION N/A 07/24/2017   Procedure: PORTA CATH INSERTION;  Surgeon: Algernon Huxley, MD;  Location: [facility];  Service: Cardiovascular;  Laterality: N/A;  . ROBOTIC ASSITED PARTIAL NEPHRECTOMY Right 10/23/2015   Procedure: ROBOTIC ASSITED PARTIAL NEPHRECTOMY with intraop ultrasound;  Surgeon: Hollice Espy, MD;  Location: [facility] ORS;  Service: Urology;  Laterality: Right;  . TUBAL LIGATION      FAMILY HISTORY: family history includes Breast cancer in her cousin; Diabetes in her brother; Heart disease in her father and mother; Kidney disease in her brother; Stroke in her daughter.  SOCIAL HISTORY:  reports that she quit smoking about 4 years ago. Her smoking use included cigarettes. She has a 15.00 pack-year smoking history. she has never  used smokeless tobacco. She reports that she does not drink alcohol or use drugs.  ALLERGIES: Metrizamide; Adhesive [tape]; Codeine; Morphine and related; Other; Tetanus toxoid; and Tramadol  MEDICATIONS:  Current Outpatient Medications  Medication Sig  Dispense Refill  . acetaminophen (TYLENOL) 325 MG tablet Take 2 tablets (650 mg total) by mouth every 6 (six) hours as needed for pain.    Marland Kitchen ALPRAZolam (XANAX) 0.25 MG tablet TAKE ONE TABLET BY MOUTH TWICE A DAY AS NEEDED FOR ANXIETY 60 tablet 1  . amLODipine (NORVASC) 10 MG tablet Take by mouth.    . brimonidine (ALPHAGAN) 0.15 % ophthalmic solution     . brimonidine (ALPHAGAN) 0.2 % ophthalmic solution Place 1 drop into both eyes 2 (two) times daily.    . Calcium-Vitamin D (SUPER CALCIUM/D) 600-125 MG-UNIT TABS Take 2 tablets by mouth daily.     . carvedilol (COREG) 6.25 MG tablet Take 1 tablet (6.25 mg total) 2 (two) times daily by mouth. 180 tablet 3  . Cholecalciferol (D3-1000 PO) Take 1,000 Units by mouth daily. Reported on 07/28/2015    . dorzolamide (TRUSOPT) 2 % ophthalmic solution     . ferrous sulfate 325 (65 FE) MG tablet Take 325 mg by mouth daily with breakfast.    . folic acid (FOLVITE) 1 MG tablet Take by mouth.    . gabapentin (NEURONTIN) 300 MG capsule TAKE 1 CAPSULE (300 MG TOTAL) BY MOUTH 3 (THREE) TIMES DAILY. AS NEEDED FOR SHINGLES PAIN 90 capsule 1  . hydrALAZINE (APRESOLINE) 10 MG tablet Take by mouth.    Marland Kitchen HYDROcodone-acetaminophen (NORCO/VICODIN) 5-325 MG tablet     . hydroxychloroquine (PLAQUENIL) 200 MG tablet Take 400 mg by mouth daily.     Marland Kitchen latanoprost (XALATAN) 0.005 % ophthalmic solution Place 1 drop into both eyes at bedtime.     Marland Kitchen levothyroxine (SYNTHROID, LEVOTHROID) 112 MCG tablet TAKE 1 TABLET  BY MOUTH DAILY BEFORE BREAKFAST. 90 tablet 0  . lidocaine-prilocaine (EMLA) cream Apply to affected area once 30 g 3  . losartan (COZAAR) 100 MG tablet TAKE ONE TABLET BY MOUTH DAILY. NOTE DOSE CHANGED TO 100MG !! 90 tablet 1  . mirabegron ER (MYRBETRIQ) 50 MG TB24 tablet Take 1 tablet (50 mg total) by mouth daily. 30 tablet 2  . mirtazapine (REMERON) 7.5 MG tablet TAKE 1 TABLET (7.5 MG TOTAL) BY MOUTH AT BEDTIME. 30 tablet 0  . Omega-3 Fatty Acids (FISH OIL) 1000 MG  CAPS Take 1,000 mg by mouth daily.     . pantoprazole (PROTONIX) 40 MG tablet TAKE ONE TABLET BY MOUTH DAILY 90 tablet 0  . rosuvastatin (CRESTOR) 5 MG tablet     . timolol (TIMOPTIC) 0.5 % ophthalmic solution     . vitamin C (ASCORBIC ACID) 500 MG tablet Take 500 mg by mouth daily.    . nitroGLYCERIN (NITROLINGUAL) 0.4 MG/SPRAY spray Place 1 spray under the tongue every 5 (five) minutes x 3 doses as needed for chest pain. (Patient not taking: Reported on 07/24/2017) 4.9 g 0  . ondansetron (ZOFRAN) 8 MG tablet Take 1 tablet (8 mg total) by mouth 2 (two) times daily as needed for refractory nausea / vomiting. (Patient not taking: Reported on 07/24/2017) 30 tablet 2  . potassium chloride SA (K-DUR,KLOR-CON) 20 MEQ tablet Take 1 tablet (20 mEq total) by mouth daily. (Patient not taking: Reported on 07/24/2017) 30 tablet 3  . prochlorperazine (COMPAZINE) 10 MG tablet Take 1 tablet (10 mg total) by mouth every 6 (six) hours as needed (Nausea  or vomiting). (Patient not taking: Reported on 07/24/2017) 60 tablet 2   No current facility-administered medications for this encounter.     ECOG PERFORMANCE STATUS:  2 - Symptomatic, <50% confined to bed  REVIEW OF SYSTEMS: Except for the pain and associated recovery from her carotid fall Patient denies any weight loss, fatigue, weakness, fever, chills or night sweats. Patient denies any loss of vision, blurred vision. Patient denies any ringing  of the ears or hearing loss. No irregular heartbeat. Patient denies heart murmur or history of fainting. Patient denies any chest pain or pain radiating to her upper extremities. Patient denies any shortness of breath, difficulty breathing at night, cough or hemoptysis. Patient denies any swelling in the lower legs. Patient denies any nausea vomiting, vomiting of blood, or coffee ground material in the vomitus. Patient denies any stomach pain. Patient states has had normal bowel movements no significant constipation or  diarrhea. Patient denies any dysuria, hematuria or significant nocturia. Patient denies any problems walking, swelling in the joints or loss of balance. Patient denies any skin changes, loss of hair or loss of weight. Patient denies any excessive worrying or anxiety or significant depression. Patient denies any problems with insomnia. Patient denies excessive thirst, polyuria, polydipsia. Patient denies any swollen glands, patient denies easy bruising or easy bleeding. Patient denies any recent infections, allergies or URI. Patient "s visual fields have not changed significantly in recent time.    PHYSICAL EXAM: BP 133/76   Pulse 73   Temp 98.3 F (36.8 C)   Resp 39  Frail-appearing elderly female wheelchair-bound. Has multiple ecchymosis on her face as well as fracture of her right forearm. Well-developed well-nourished patient in NAD. HEENT reveals PERLA, EOMI, discs not visualized.  Oral cavity is clear. No oral mucosal lesions are identified. Neck is clear without evidence of cervical or supraclavicular adenopathy. Lungs are clear to A&P. Cardiac examination is essentially unremarkable with regular rate and rhythm without murmur rub or thrill. Abdomen is benign with no organomegaly or masses noted. Motor sensory and DTR levels are equal and symmetric in the upper and lower extremities. Cranial nerves II through XII are grossly intact. Proprioception is intact. No peripheral adenopathy or edema is identified. No motor or sensory levels are noted. Crude visual fields are within normal range.  LABORATORY DATA: Pathology reports reviewed    RADIOLOGY RESULTS: CT scans and PET/CT scans reviewed   IMPRESSION: Stage IIB large cell neuroendocrine tumor of the left lower lobe in 76 year old female.  PLAN: At this time based on the patient's ability to be mobile based on her recent traumatic fall I would plan on a hypofractionated course of radiation therapy to her left lung nodule as well as or hilar  adenopathy. Would plan on delivering 6000 cGy in 10 fractions using IM RT radiation therapy. I would choose I am RT specifically because we're treating a hypofractionated course with 600 cGy per fraction dose as well as close proximity to the left ventricle esophagus spinal, cord. Risks and benefits of treatment including fatigue alteration of blood counts possible dysphasia from radiation esophagitis skin reaction all were discussed in detail with the patient and her family.There will be extra effort by both professional staff as well as technical staff to coordinate and manage concurrent chemoradiation and ensuing side effects during her treatments. I firstly set up and ordered CT simulation for later this week. We'll also use 4D study as well as PET CT fusion study for treatment planning. Patient family all comprehend  my treatment plan well.  I would like to take this opportunity to thank you for allowing me to participate in the care of your patient.Noreene Filbert, MD

## 2017-07-31 DIAGNOSIS — S62300A Unspecified fracture of second metacarpal bone, right hand, initial encounter for closed fracture: Secondary | ICD-10-CM | POA: Diagnosis not present

## 2017-08-01 ENCOUNTER — Ambulatory Visit
Admission: RE | Admit: 2017-08-01 | Discharge: 2017-08-01 | Disposition: A | Discharge status: Home / Self Care | Payer: Medicare Other | Source: Ambulatory Visit | Attending: Radiation Oncology | Admitting: Radiation Oncology

## 2017-08-01 ENCOUNTER — Telehealth: Payer: Self-pay | Admitting: Internal Medicine

## 2017-08-01 DIAGNOSIS — Z87891 Personal history of nicotine dependence: Secondary | ICD-10-CM | POA: Diagnosis not present

## 2017-08-01 DIAGNOSIS — F419 Anxiety disorder, unspecified: Secondary | ICD-10-CM | POA: Diagnosis not present

## 2017-08-01 DIAGNOSIS — L0390 Cellulitis, unspecified: Secondary | ICD-10-CM | POA: Diagnosis not present

## 2017-08-01 DIAGNOSIS — Z510 Encounter for antineoplastic radiation therapy: Secondary | ICD-10-CM | POA: Diagnosis not present

## 2017-08-01 DIAGNOSIS — D649 Anemia, unspecified: Secondary | ICD-10-CM | POA: Diagnosis not present

## 2017-08-01 DIAGNOSIS — C7A1 Malignant poorly differentiated neuroendocrine tumors: Secondary | ICD-10-CM | POA: Diagnosis not present

## 2017-08-01 DIAGNOSIS — M129 Arthropathy, unspecified: Secondary | ICD-10-CM | POA: Diagnosis not present

## 2017-08-01 NOTE — Telephone Encounter (Signed)
Received records today where pt was seen at [facility].

## 2017-08-01 NOTE — Telephone Encounter (Signed)
° °  [facility] Pre-operative Risk Assessment    Request for ICD / Pacemaker clearance   1. What type of surgery is being performed? Radiation tx Left Lung   2. When is this surgery scheduled? 08/18/17 start   3. Are there any medications that need to be held prior to surgery and how long?   4. Practice name and name of physician performing surgery? [facility] Dr. Baruch Gouty   What is your office phone and fax number?  [phone] fax [phone]  5. Anesthesia type (None, local, MAC, general) ? Unknown      _________________________________________________________________   (provider comments below)

## 2017-08-01 NOTE — Telephone Encounter (Signed)
[place] form received. Faxed to [facility] at [facility]- [phone] to complete and review with Dr. Caryl Comes who is in the office there today.

## 2017-08-04 NOTE — Progress Notes (Signed)
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  HISTORY OF PRESENT ILLNESS:  Patient returns to clinic today for further evaluation and initiation of cycle 1 of carboplatinum and etoposide.  The ecchymosis on her face are still evident, but significantly improved.  She does not complain of pain today.  She currently feels well. She has no neurologic complaints.  She denies any recent fevers or illnesses. She has a good appetite and denies weight loss.  She denies any chest pain, cough, hemoptysis, or shortness of breath. She denies any nausea, vomiting, constipation, or diarrhea. She has no urinary complaints. Patient offers no further specific complaints today.   REVIEW OF SYSTEMS:    Review of Systems  Constitutional: Negative.  Negative for fever, malaise/fatigue and weight loss.  HENT: Negative.   Eyes: Negative.  Negative for blurred vision, double vision and pain.  Respiratory: Negative.  Negative for cough and shortness of breath.   Cardiovascular: Negative.  Negative for chest pain and leg swelling.  Gastrointestinal: Negative.   Genitourinary: Negative.   Musculoskeletal: Negative.  Negative for falls.  Skin: Negative.  Negative for rash.  Neurological: Negative.  Negative for sensory change and weakness.  Psychiatric/Behavioral: Negative.  The patient is not nervous/anxious.   All other systems reviewed and are negative.   PAST MEDICAL HISTORY: Past Medical History:  Diagnosis Date   Anemia 02/2013   F/U with Dr. Grayland Ormond for Feraheme injections   Anxiety     Arthritis    Possible RA - Follow up appt with Dr. Jefm Bryant   Cellulitis of arm, right    Diabetes insipidus ([place])    On desmopressin   GERD (gastroesophageal reflux disease)    H/O seasonal allergies    H/O transfusion of packed red blood cells 02/2013   3 units PRBC for severe anemia 02/2013   Herniated disc    History of kidney stones    Hyperlipidemia    Hypertension    Hyperthyroidism    s/p radiation therapy, now hypothyroidism   IBS (irritable bowel syndrome)    Migraine    Pacemaker    a. s/p successful implantation of a Medtronic CapSureFix Novus MRI SureScan serial # [id number] on 06/28/16 by Dr. Caryl Comes for sinus node dysfunction.   Pancreatic cyst    Paroxysmal A-fib [facility])    Renal cell cancer, right ([place]) 10/23/2015   Right partial nephrectomy.    PAST SURGICAL HISTORY: Past Surgical History:  Procedure Laterality Date   ABDOMINAL HYSTERECTOMY     BREAST EXCISIONAL BIOPSY Right 2005   neg   BREAST SURGERY  1990s   Biopsy   CERVICAL SPINE SURGERY     Bone fusion   COLONOSCOPY  2008?   [place]   COLONOSCOPY WITH PROPOFOL N/A 10/15/2016   Procedure: COLONOSCOPY WITH PROPOFOL;  Surgeon: Robert Bellow, MD;  Location: [facility] ENDOSCOPY;  Service: Endoscopy;  Laterality: N/A;   EP IMPLANTABLE DEVICE N/A 06/28/2016   Procedure: Pacemaker Implant;  Surgeon: Deboraha Sprang, MD;  Location: [place] CV LAB;  Service: Cardiovascular;  Laterality: N/A;   HAMMER TOE SURGERY  HERNIA REPAIR Left    Inguinal Hernia Repair   PORTA CATH INSERTION N/A 07/24/2017   Procedure: PORTA CATH INSERTION;  Surgeon: Algernon Huxley, MD;  Location: [place] CV LAB;  Service: Cardiovascular;  Laterality: N/A;   ROBOTIC ASSITED PARTIAL NEPHRECTOMY Right 10/23/2015   Procedure: ROBOTIC ASSITED PARTIAL NEPHRECTOMY with intraop ultrasound;  Surgeon: Hollice Espy, MD;  Location: [facility] ORS;  Service: Urology;  Laterality: Right;   TUBAL LIGATION       FAMILY HISTORY Family History  Problem Relation Age of Onset   Heart disease Mother    Heart disease Father    Diabetes Brother    Kidney disease Brother    Stroke Daughter        due to medication reaction   Breast cancer Cousin    Prostate cancer Neg Hx    Hematuria Neg Hx     ADVANCED DIRECTIVES:  No flowsheet data found.  HEALTH MAINTENANCE: Social History   Tobacco Use   Smoking status: Former Smoker    Packs/day: 0.50    Years: 30.00    Pack years: 15.00    Types: Cigarettes    Last attempt to quit: 12/03/2012    Years since quitting: 4.6   Smokeless tobacco: Never Used  Substance Use Topics   Alcohol use: No    Alcohol/week: 0.0 oz   Drug use: No     Allergies  Allergen Reactions   Metrizamide     Other reaction(s): Other (See Comments)   Adhesive [Tape] Other (See Comments)    "irritate skin"   Codeine     REACTION: NAUSEA   Morphine And Related Other (See Comments)    Shut down her organs   Other     Skin irritation   Tetanus Toxoid     REACTION: ARM SWELLING,REDNESS   Tramadol Nausea And Vomiting    Current Outpatient Medications  Medication Sig Dispense Refill   acetaminophen (TYLENOL) 325 MG tablet Take 2 tablets (650 mg total) by mouth every 6 (six) hours as needed for pain.     ALPRAZolam (XANAX) 0.25 MG tablet TAKE ONE TABLET BY MOUTH TWICE A DAY AS NEEDED FOR ANXIETY 60 tablet 1   amLODipine (NORVASC) 10 MG tablet Take by mouth.     brimonidine (ALPHAGAN) 0.15 % ophthalmic solution      brimonidine (ALPHAGAN) 0.2 % ophthalmic solution Place 1 drop into both eyes 2 (two) times daily.     Calcium-Vitamin D (SUPER CALCIUM/D) 600-125 MG-UNIT TABS Take 2 tablets by mouth daily.      carvedilol (COREG) 6.25 MG tablet Take 1 tablet (6.25 mg total) 2 (two) times daily by mouth. 180 tablet 3   Cholecalciferol (D3-1000 PO) Take 1,000 Units by mouth daily. Reported on 07/28/2015     dorzolamide (TRUSOPT) 2 % ophthalmic  solution      ferrous sulfate 325 (65 FE) MG tablet Take 325 mg by mouth daily with breakfast.     folic acid (FOLVITE) 1 MG tablet Take by mouth.     gabapentin (NEURONTIN) 300 MG capsule TAKE 1 CAPSULE (300 MG TOTAL) BY MOUTH 3 (THREE) TIMES DAILY. AS NEEDED FOR SHINGLES PAIN 90 capsule 1   hydrALAZINE (APRESOLINE) 10 MG tablet Take by mouth.     HYDROcodone-acetaminophen (NORCO/VICODIN) 5-325 MG tablet      hydroxychloroquine (PLAQUENIL) 200 MG tablet Take 400 mg by mouth daily.      latanoprost (XALATAN) 0.005 % ophthalmic solution Place 1 drop into both eyes at  bedtime.      levothyroxine (SYNTHROID, LEVOTHROID) 112 MCG tablet TAKE 1 TABLET  BY MOUTH DAILY BEFORE BREAKFAST. 90 tablet 0   lidocaine-prilocaine (EMLA) cream Apply to affected area once 30 g 3   losartan (COZAAR) 100 MG tablet TAKE ONE TABLET BY MOUTH DAILY. NOTE DOSE CHANGED TO 100MG!! 90 tablet 1   mirabegron ER (MYRBETRIQ) 50 MG TB24 tablet Take 1 tablet (50 mg total) by mouth daily. 30 tablet 2   mirtazapine (REMERON) 7.5 MG tablet TAKE 1 TABLET (7.5 MG TOTAL) BY MOUTH AT BEDTIME. 30 tablet 0   nitroGLYCERIN (NITROLINGUAL) 0.4 MG/SPRAY spray Place 1 spray under the tongue every 5 (five) minutes x 3 doses as needed for chest pain. 4.9 g 0   Omega-3 Fatty Acids (FISH OIL) 1000 MG CAPS Take 1,000 mg by mouth daily.      ondansetron (ZOFRAN) 8 MG tablet Take 1 tablet (8 mg total) by mouth 2 (two) times daily as needed for refractory nausea / vomiting. 30 tablet 2   pantoprazole (PROTONIX) 40 MG tablet TAKE ONE TABLET BY MOUTH DAILY 90 tablet 0   potassium chloride SA (K-DUR,KLOR-CON) 20 MEQ tablet Take 1 tablet (20 mEq total) by mouth daily. 30 tablet 3   prochlorperazine (COMPAZINE) 10 MG tablet Take 1 tablet (10 mg total) by mouth every 6 (six) hours as needed (Nausea or vomiting). 60 tablet 2   rosuvastatin (CRESTOR) 5 MG tablet      timolol (TIMOPTIC) 0.5 % ophthalmic solution      vitamin C (ASCORBIC  ACID) 500 MG tablet Take 500 mg by mouth daily.     No current facility-administered medications for this visit.    Facility-Administered Medications Ordered in Other Visits  Medication Dose Route Frequency Provider Last Rate Last Dose   CARBOplatin (PARAPLATIN) 370 mg in sodium chloride 0.9 % 250 mL chemo infusion  370 mg Intravenous Once Lloyd Huger, MD       dexamethasone (DECADRON) injection 10 mg  10 mg Intravenous Once Lloyd Huger, MD       etoposide (VEPESID) 130 mg in sodium chloride 0.9 % 500 mL chemo infusion  80 mg/m2 (Treatment Plan Recorded) Intravenous Once Lloyd Huger, MD       heparin lock flush 100 unit/mL  500 Units Intracatheter Once PRN Lloyd Huger, MD       palonosetron (ALOXI) injection 0.25 mg  0.25 mg Intravenous Once Lloyd Huger, MD        OBJECTIVE:  Vitals:   08/06/17 0857  BP: (!) 148/86  Pulse: 68  Resp: 18  Temp: 98.2 F (36.8 C)     Body mass index is 25.5 kg/m.    ECOG FS:0 - Asymptomatic  Physical Exam   General: Well-developed, well-nourished, no acute distress. Eyes: Pink conjunctiva, anicteric sclera. HEENT: Swelling and ecchymosis surrounding both eyes, significantly improved. Lungs: Clear to auscultation bilaterally. Heart: Regular rate and rhythm. No rubs, murmurs, or gallops. Abdomen: Soft, nontender, nondistended. No organomegaly noted, normoactive bowel sounds. Musculoskeletal: No edema, cyanosis, or clubbing.  Right arm in splint secondary to fracture. Neuro: Alert, answering all questions appropriately. Cranial nerves grossly intact. Skin: No rashes or petechiae noted. Psych: Normal affect.   LAB RESULTS:  Appointment on 08/06/2017  Component Date Value Ref Range Status   Sodium 08/06/2017 137  135 - 145 mmol/L Final   Potassium 08/06/2017 4.1  3.5 - 5.1 mmol/L Final   Chloride 08/06/2017 104  101 - 111 mmol/L Final  CO2 08/06/2017 26  22 - 32 mmol/L Final   Glucose, Bld  08/06/2017 104* 65 - 99 mg/dL Final   BUN 08/06/2017 26* 6 - 20 mg/dL Final   Creatinine, Ser 08/06/2017 0.85  0.44 - 1.00 mg/dL Final   Calcium 08/06/2017 9.1  8.9 - 10.3 mg/dL Final   Total Protein 08/06/2017 7.5  6.5 - 8.1 g/dL Final   Albumin 08/06/2017 4.0  3.5 - 5.0 g/dL Final   AST 08/06/2017 45* 15 - 41 U/L Final   ALT 08/06/2017 27  14 - 54 U/L Final   Alkaline Phosphatase 08/06/2017 60  38 - 126 U/L Final   Total Bilirubin 08/06/2017 0.4  0.3 - 1.2 mg/dL Final   GFR calc non Af Amer 08/06/2017 >60  >60 mL/min Final   GFR calc Af Amer 08/06/2017 >60  >60 mL/min Final   Comment: (NOTE) The eGFR has been calculated using the CKD EPI equation. This calculation has not been validated in all clinical situations. eGFR's persistently <60 mL/min signify possible Chronic Kidney Disease.    Anion gap 08/06/2017 7  5 - 15 Final   Performed at [facility], [address] [place], [place] [address]   WBC 08/06/2017 3.3* 3.6 - 11.0 K/uL Final   RBC 08/06/2017 3.51* 3.80 - 5.20 MIL/uL Final   Hemoglobin 08/06/2017 10.4* 12.0 - 16.0 g/dL Final   HCT 08/06/2017 31.3* 35.0 - 47.0 % Final   MCV 08/06/2017 89.1  80.0 - 100.0 fL Final   MCH 08/06/2017 29.8  26.0 - 34.0 pg Final   MCHC 08/06/2017 33.4  32.0 - 36.0 g/dL Final   RDW 08/06/2017 15.5* 11.5 - 14.5 % Final   Platelets 08/06/2017 129* 150 - 440 K/uL Final   Neutrophils Relative % 08/06/2017 28  % Final   Neutro Abs 08/06/2017 0.9* 1.4 - 6.5 K/uL Final   Lymphocytes Relative 08/06/2017 32  % Final   Lymphs Abs 08/06/2017 1.1  1.0 - 3.6 K/uL Final   Monocytes Relative 08/06/2017 38  % Final   Monocytes Absolute 08/06/2017 1.3* 0.2 - 0.9 K/uL Final   Eosinophils Relative 08/06/2017 1  % Final   Eosinophils Absolute 08/06/2017 0.0  0 - 0.7 K/uL Final   Basophils Relative 08/06/2017 1  % Final   Basophils Absolute 08/06/2017 0.0  0 - 0.1 K/uL Final   Performed at [facility], [address] [place], [place] [address]       STUDIES: Ct Biopsy  Result Date: 14-Jul-2017 CLINICAL DATA:  Left lower lobe lung nodule measuring approximately 2.3 cm an demonstrating hypermetabolic activity by PET scan. Associated hypermetabolic left hilar lymph node. History of renal carcinoma resection in 2017. EXAM: CT GUIDED CORE BIOPSY OF LEFT LOWER LOBE LUNG NODULE ANESTHESIA/SEDATION: 2.0 mg IV Versed; 100 mcg IV Fentanyl Total Moderate Sedation Time:  25 minutes. The patient's level of consciousness and physiologic status were continuously monitored during the procedure by Radiology nursing. PROCEDURE: The procedure risks, benefits, and alternatives were explained to the patient. Questions regarding the procedure were encouraged and answered. The patient understands and consents to the procedure. A time-out was performed prior to initiating the procedure. CT was performed through the lower lung zones in a prone position. The left posterolateral chest wall was prepped with chlorhexidine in a sterile fashion, and a sterile drape was applied covering the operative field. A sterile gown and sterile gloves were used for the procedure. Local anesthesia was provided with 1% Lidocaine. After localizing a  left lower lobe lung nodule, a 17 gauge needle was advanced under CT guidance into the nodule. Two separate coaxial 18 gauge core biopsy samples were obtained. Additional CT was performed. During needle removal, the Biosentry device was utilized in depositing a hydrogel plug at the pleural entry site. COMPLICATIONS: Focal parenchymal hemorrhage deep to the nodule following biopsy. SIR level A: No therapy, no consequence. FINDINGS: Ovoid left lower lobe nodule localized by CT. This shows some probable interval enlargement since prior imaging and measures 1.8 x 2.7 cm. Solid tissue was obtained. After the second biopsy, there was evidence of some parenchymal hemorrhage deep to the nodule. The patient was asymptomatic  and did not have any hemoptysis during the procedure or immediately following. After outer needle removal, there is no evidence of pneumothorax. IMPRESSION: CT-guided core biopsy performed of left lower lobe lung nodule now measuring 2.7 cm in greatest diameter. The procedure was complicated by some parenchymal hemorrhage in the adjacent lung. The patient was asymptomatic and will be followed during recovery. Electronically Signed   By: Aletta Edouard M.D.   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  Plan:  1. 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  Patient will benefit from concurrent chemotherapy and XRT.  Recent CT scan of her head after her fall did not reveal metastatic disease. Patient will receive carboplatinum and etoposide on day 1 and then etoposide only on days 2 and 3.  Will give Neulasta for cycle 1 given her baseline neutropenia, but this may have to be discontinued during XRT which will start on August 18, 2017.  She will receive carboplatinum instead of cisplatin given her history of a partial nephrectomy.   Proceed with cycle 1, day 1 of carboplatinum and etoposide today.  Return to clinic in 1 and 2 days for etoposide only.  Patient will then return to clinic in 1 week for laboratory work and further evaluation and then in 3 weeks for consideration of cycle 2. 2.  Pancytopenia: Previously, bone marrow biopsy revealed no evidence of MDS or other abnormality.  It is possible it is related to her Plaquenil or rheumatoid arthritis. The remainder of her laboratory work was either negative or within normal limits. No intervention is needed at this time.  Neulasta as above. 3. Renal cell carcinoma of the right kidney: Status post partial nephrectomy. Continue follow-up with urology as  indicated. 4. Rheumatoid arthritis: Continue Plaquenil. Continue follow-up per rheumatology.  5.  Fracture right forearm: Continue follow-up with orthopedics as indicated.  Approximately 30 minutes was spent in discussion of which greater than 50% was consultation.  Patient expressed understanding and was in agreement with this plan. She also understands that She can call clinic at any time with any questions, concerns, or complaints.    Lloyd Huger, MD   08/06/2017 10:05 AM

## 2017-08-05 DIAGNOSIS — I480 Paroxysmal atrial fibrillation: Secondary | ICD-10-CM | POA: Diagnosis not present

## 2017-08-05 DIAGNOSIS — L0390 Cellulitis, unspecified: Secondary | ICD-10-CM | POA: Diagnosis not present

## 2017-08-05 DIAGNOSIS — Z87442 Personal history of urinary calculi: Secondary | ICD-10-CM | POA: Diagnosis not present

## 2017-08-05 DIAGNOSIS — C7A1 Malignant poorly differentiated neuroendocrine tumors: Secondary | ICD-10-CM | POA: Diagnosis not present

## 2017-08-05 DIAGNOSIS — Z510 Encounter for antineoplastic radiation therapy: Secondary | ICD-10-CM | POA: Diagnosis not present

## 2017-08-05 DIAGNOSIS — Z9181 History of falling: Secondary | ICD-10-CM | POA: Diagnosis not present

## 2017-08-05 DIAGNOSIS — D649 Anemia, unspecified: Secondary | ICD-10-CM | POA: Diagnosis not present

## 2017-08-05 DIAGNOSIS — M129 Arthropathy, unspecified: Secondary | ICD-10-CM | POA: Diagnosis not present

## 2017-08-05 DIAGNOSIS — E232 Diabetes insipidus: Secondary | ICD-10-CM | POA: Diagnosis not present

## 2017-08-05 DIAGNOSIS — E785 Hyperlipidemia, unspecified: Secondary | ICD-10-CM | POA: Diagnosis not present

## 2017-08-05 DIAGNOSIS — Z8781 Personal history of (healed) traumatic fracture: Secondary | ICD-10-CM | POA: Diagnosis not present

## 2017-08-05 DIAGNOSIS — Z803 Family history of malignant neoplasm of breast: Secondary | ICD-10-CM | POA: Diagnosis not present

## 2017-08-05 DIAGNOSIS — Z85528 Personal history of other malignant neoplasm of kidney: Secondary | ICD-10-CM | POA: Diagnosis not present

## 2017-08-05 DIAGNOSIS — F419 Anxiety disorder, unspecified: Secondary | ICD-10-CM | POA: Diagnosis not present

## 2017-08-05 DIAGNOSIS — E0590 Thyrotoxicosis, unspecified without thyrotoxic crisis or storm: Secondary | ICD-10-CM | POA: Diagnosis not present

## 2017-08-05 DIAGNOSIS — Z79899 Other long term (current) drug therapy: Secondary | ICD-10-CM | POA: Diagnosis not present

## 2017-08-05 DIAGNOSIS — I10 Essential (primary) hypertension: Secondary | ICD-10-CM | POA: Diagnosis not present

## 2017-08-05 DIAGNOSIS — K589 Irritable bowel syndrome without diarrhea: Secondary | ICD-10-CM | POA: Diagnosis not present

## 2017-08-05 DIAGNOSIS — Z87891 Personal history of nicotine dependence: Secondary | ICD-10-CM | POA: Diagnosis not present

## 2017-08-06 ENCOUNTER — Encounter: Payer: Self-pay | Admitting: *Deleted

## 2017-08-06 ENCOUNTER — Inpatient Hospital Stay: Payer: Medicare Other

## 2017-08-06 ENCOUNTER — Ambulatory Visit: Payer: Medicare Other

## 2017-08-06 ENCOUNTER — Inpatient Hospital Stay: Payer: Medicare Other | Attending: Oncology | Admitting: Oncology

## 2017-08-06 VITALS — BP 148/86 | HR 68 | Temp 98.2°F | Resp 18 | Wt 139.4 lb

## 2017-08-06 DIAGNOSIS — Z90710 Acquired absence of both cervix and uterus: Secondary | ICD-10-CM | POA: Insufficient documentation

## 2017-08-06 DIAGNOSIS — Z7689 Persons encountering health services in other specified circumstances: Secondary | ICD-10-CM | POA: Diagnosis not present

## 2017-08-06 DIAGNOSIS — R197 Diarrhea, unspecified: Secondary | ICD-10-CM | POA: Diagnosis not present

## 2017-08-06 DIAGNOSIS — C7A8 Other malignant neuroendocrine tumors: Secondary | ICD-10-CM

## 2017-08-06 DIAGNOSIS — Z803 Family history of malignant neoplasm of breast: Secondary | ICD-10-CM | POA: Insufficient documentation

## 2017-08-06 DIAGNOSIS — Z79899 Other long term (current) drug therapy: Secondary | ICD-10-CM | POA: Diagnosis not present

## 2017-08-06 DIAGNOSIS — K219 Gastro-esophageal reflux disease without esophagitis: Secondary | ICD-10-CM

## 2017-08-06 DIAGNOSIS — R911 Solitary pulmonary nodule: Secondary | ICD-10-CM | POA: Diagnosis not present

## 2017-08-06 DIAGNOSIS — R110 Nausea: Secondary | ICD-10-CM | POA: Insufficient documentation

## 2017-08-06 DIAGNOSIS — Z950 Presence of cardiac pacemaker: Secondary | ICD-10-CM | POA: Diagnosis not present

## 2017-08-06 DIAGNOSIS — E232 Diabetes insipidus: Secondary | ICD-10-CM | POA: Diagnosis not present

## 2017-08-06 DIAGNOSIS — S42301D Unspecified fracture of shaft of humerus, right arm, subsequent encounter for fracture with routine healing: Secondary | ICD-10-CM | POA: Insufficient documentation

## 2017-08-06 DIAGNOSIS — E785 Hyperlipidemia, unspecified: Secondary | ICD-10-CM | POA: Insufficient documentation

## 2017-08-06 DIAGNOSIS — I480 Paroxysmal atrial fibrillation: Secondary | ICD-10-CM

## 2017-08-06 DIAGNOSIS — R58 Hemorrhage, not elsewhere classified: Secondary | ICD-10-CM | POA: Diagnosis not present

## 2017-08-06 DIAGNOSIS — X58XXXD Exposure to other specified factors, subsequent encounter: Secondary | ICD-10-CM | POA: Insufficient documentation

## 2017-08-06 DIAGNOSIS — C641 Malignant neoplasm of right kidney, except renal pelvis: Secondary | ICD-10-CM | POA: Insufficient documentation

## 2017-08-06 DIAGNOSIS — Z5111 Encounter for antineoplastic chemotherapy: Secondary | ICD-10-CM | POA: Diagnosis not present

## 2017-08-06 DIAGNOSIS — Z87891 Personal history of nicotine dependence: Secondary | ICD-10-CM | POA: Diagnosis not present

## 2017-08-06 DIAGNOSIS — I10 Essential (primary) hypertension: Secondary | ICD-10-CM | POA: Insufficient documentation

## 2017-08-06 DIAGNOSIS — E890 Postprocedural hypothyroidism: Secondary | ICD-10-CM | POA: Diagnosis not present

## 2017-08-06 DIAGNOSIS — M069 Rheumatoid arthritis, unspecified: Secondary | ICD-10-CM | POA: Insufficient documentation

## 2017-08-06 DIAGNOSIS — D61818 Other pancytopenia: Secondary | ICD-10-CM

## 2017-08-06 DIAGNOSIS — Z87442 Personal history of urinary calculi: Secondary | ICD-10-CM | POA: Diagnosis not present

## 2017-08-06 DIAGNOSIS — M1990 Unspecified osteoarthritis, unspecified site: Secondary | ICD-10-CM | POA: Diagnosis not present

## 2017-08-06 LAB — COMPREHENSIVE METABOLIC PANEL
ALBUMIN: 4 g/dL (ref 3.5–5.0)
ALK PHOS: 60 U/L (ref 38–126)
ALT: 27 U/L (ref 14–54)
AST: 45 U/L — AB (ref 15–41)
Anion gap: 7 (ref 5–15)
BUN: 26 mg/dL — AB (ref 6–20)
CALCIUM: 9.1 mg/dL (ref 8.9–10.3)
CO2: 26 mmol/L (ref 22–32)
CREATININE: 0.85 mg/dL (ref 0.44–1.00)
Chloride: 104 mmol/L (ref 101–111)
GFR calc Af Amer: >60 mL/min (ref >60)
GFR calc non Af Amer: >60 mL/min (ref >60)
GLUCOSE: 104 mg/dL — AB (ref 65–99)
Potassium: 4.1 mmol/L (ref 3.5–5.1)
SODIUM: 137 mmol/L (ref 135–145)
Total Bilirubin: 0.4 mg/dL (ref 0.3–1.2)
Total Protein: 7.5 g/dL (ref 6.5–8.1)

## 2017-08-06 LAB — CBC WITH DIFFERENTIAL/PLATELET
BASOS PCT: 1 %
Basophils Absolute: 0 10*3/uL (ref 0–0.1)
EOS ABS: 0 10*3/uL (ref 0–0.7)
Eosinophils Relative: 1 %
HCT: 31.3 % — ABNORMAL LOW (ref 35.0–47.0)
Hemoglobin: 10.4 g/dL — ABNORMAL LOW (ref 12.0–16.0)
Lymphocytes Relative: 32 %
Lymphs Abs: 1.1 10*3/uL (ref 1.0–3.6)
MCH: 29.8 pg (ref 26.0–34.0)
MCHC: 33.4 g/dL (ref 32.0–36.0)
MCV: 89.1 fL (ref 80.0–100.0)
MONO ABS: 1.3 10*3/uL — AB (ref 0.2–0.9)
MONOS PCT: 38 %
Neutro Abs: 0.9 10*3/uL — ABNORMAL LOW (ref 1.4–6.5)
Neutrophils Relative %: 28 %
Platelets: 129 10*3/uL — ABNORMAL LOW (ref 150–440)
RBC: 3.51 MIL/uL — ABNORMAL LOW (ref 3.80–5.20)
RDW: 15.5 % — AB (ref 11.5–14.5)
WBC: 3.3 10*3/uL — ABNORMAL LOW (ref 3.6–11.0)

## 2017-08-06 MED ORDER — DEXAMETHASONE SODIUM PHOSPHATE 10 MG/ML IJ SOLN
10.0000 mg | Freq: Once | INTRAMUSCULAR | Status: AC
  Administered 2017-08-06: 10 mg via INTRAVENOUS
  Filled 2017-08-06: qty 1

## 2017-08-06 MED ORDER — SODIUM CHLORIDE 0.9 % IV SOLN
80.0000 mg/m2 | Freq: Once | INTRAVENOUS | Status: AC
  Administered 2017-08-06: 130 mg via INTRAVENOUS
  Filled 2017-08-06: qty 6.5

## 2017-08-06 MED ORDER — SODIUM CHLORIDE 0.9 % IV SOLN
Freq: Once | INTRAVENOUS | Status: AC
  Administered 2017-08-06: 10:00:00 via INTRAVENOUS
  Filled 2017-08-06: qty 1000

## 2017-08-06 MED ORDER — PALONOSETRON HCL INJECTION 0.25 MG/5ML
0.2500 mg | Freq: Once | INTRAVENOUS | Status: AC
  Administered 2017-08-06: 0.25 mg via INTRAVENOUS
  Filled 2017-08-06: qty 5

## 2017-08-06 MED ORDER — SODIUM CHLORIDE 0.9 % IV SOLN
370.0000 mg | Freq: Once | INTRAVENOUS | Status: AC
  Administered 2017-08-06: 370 mg via INTRAVENOUS
  Filled 2017-08-06: qty 37

## 2017-08-06 MED ORDER — DEXAMETHASONE SODIUM PHOSPHATE 100 MG/10ML IJ SOLN: 10.0000 mg | Freq: Once | INTRAMUSCULAR | Status: DC

## 2017-08-06 MED ORDER — HEPARIN SOD (PORK) LOCK FLUSH 100 UNIT/ML IV SOLN
500.0000 [IU] | Freq: Once | INTRAVENOUS | Status: AC | PRN
  Administered 2017-08-06: 500 [IU]
  Filled 2017-08-06: qty 5

## 2017-08-06 NOTE — Progress Notes (Signed)
ANC 0.9 today. Per Dr Grayland Ormond okay to proceed with treatment today. Plan to give Neulasta OnPro on Day 3

## 2017-08-06 NOTE — Progress Notes (Signed)
  Oncology Nurse Navigator Documentation  Navigator Location: [facility]Med Onc (08/06/17 1000)   )Navigator Encounter Type: Treatment (08/06/17 1000)                   Treatment Initiated Date: 08/06/17 (08/06/17 1000) Patient Visit Type: MedOnc (08/06/17 1000) Treatment Phase: First Chemo Tx (08/06/17 1000) Barriers/Navigation Needs: Coordination of Care (08/06/17 1000)   Interventions: Coordination of Care (08/06/17 1000)   Coordination of Care: Appts;Chemo (08/06/17 1000)         met with patient and family during follow up visit with Dr. Grayland Ormond prior to starting first chemo treatment. All questions answered at the time of visit. Reviewed upcoming appts with pt and family. No further questions or needs at this time. Instructed pt and family to call with any future needs or questions. Pt and family verbalized understanding.          Time Spent with Patient: 60 (08/06/17 1000)

## 2017-08-07 ENCOUNTER — Other Ambulatory Visit: Payer: Self-pay | Admitting: Internal Medicine

## 2017-08-07 ENCOUNTER — Inpatient Hospital Stay: Payer: Medicare Other

## 2017-08-07 VITALS — BP 143/77 | HR 74 | Temp 97.8°F | Resp 18 | Wt 139.4 lb

## 2017-08-07 DIAGNOSIS — C641 Malignant neoplasm of right kidney, except renal pelvis: Secondary | ICD-10-CM | POA: Diagnosis not present

## 2017-08-07 DIAGNOSIS — D61818 Other pancytopenia: Secondary | ICD-10-CM | POA: Diagnosis not present

## 2017-08-07 DIAGNOSIS — R911 Solitary pulmonary nodule: Secondary | ICD-10-CM | POA: Diagnosis not present

## 2017-08-07 DIAGNOSIS — C7A8 Other malignant neuroendocrine tumors: Secondary | ICD-10-CM | POA: Diagnosis not present

## 2017-08-07 DIAGNOSIS — Z5111 Encounter for antineoplastic chemotherapy: Secondary | ICD-10-CM | POA: Diagnosis not present

## 2017-08-07 DIAGNOSIS — Z7689 Persons encountering health services in other specified circumstances: Secondary | ICD-10-CM | POA: Diagnosis not present

## 2017-08-07 MED ORDER — SODIUM CHLORIDE 0.9 % IV SOLN
Freq: Once | INTRAVENOUS | Status: AC
  Administered 2017-08-07: 14:00:00 via INTRAVENOUS
  Filled 2017-08-07: qty 1000

## 2017-08-07 MED ORDER — SODIUM CHLORIDE 0.9% FLUSH
10.0000 mL | INTRAVENOUS | Status: DC | PRN
  Administered 2017-08-07: 10 mL
  Filled 2017-08-07: qty 10

## 2017-08-07 MED ORDER — HEPARIN SOD (PORK) LOCK FLUSH 100 UNIT/ML IV SOLN
500.0000 [IU] | Freq: Once | INTRAVENOUS | Status: AC | PRN
  Administered 2017-08-07: 500 [IU]
  Filled 2017-08-07: qty 5

## 2017-08-07 MED ORDER — DEXAMETHASONE SODIUM PHOSPHATE 10 MG/ML IJ SOLN
10.0000 mg | Freq: Once | INTRAMUSCULAR | Status: AC
  Administered 2017-08-07: 10 mg via INTRAVENOUS
  Filled 2017-08-07: qty 1

## 2017-08-07 MED ORDER — SODIUM CHLORIDE 0.9 % IV SOLN
80.0000 mg/m2 | Freq: Once | INTRAVENOUS | Status: AC
  Administered 2017-08-07: 130 mg via INTRAVENOUS
  Filled 2017-08-07: qty 6.5

## 2017-08-08 ENCOUNTER — Ambulatory Visit: Payer: Medicare Other | Admitting: Oncology

## 2017-08-08 ENCOUNTER — Inpatient Hospital Stay: Payer: Medicare Other

## 2017-08-08 VITALS — BP 124/72 | HR 74 | Temp 97.3°F | Resp 18

## 2017-08-08 DIAGNOSIS — D61818 Other pancytopenia: Secondary | ICD-10-CM | POA: Diagnosis not present

## 2017-08-08 DIAGNOSIS — C7A8 Other malignant neuroendocrine tumors: Secondary | ICD-10-CM | POA: Diagnosis not present

## 2017-08-08 DIAGNOSIS — R911 Solitary pulmonary nodule: Secondary | ICD-10-CM | POA: Diagnosis not present

## 2017-08-08 DIAGNOSIS — Z5111 Encounter for antineoplastic chemotherapy: Secondary | ICD-10-CM | POA: Diagnosis not present

## 2017-08-08 DIAGNOSIS — Z7689 Persons encountering health services in other specified circumstances: Secondary | ICD-10-CM | POA: Diagnosis not present

## 2017-08-08 DIAGNOSIS — C641 Malignant neoplasm of right kidney, except renal pelvis: Secondary | ICD-10-CM | POA: Diagnosis not present

## 2017-08-08 MED ORDER — DEXAMETHASONE SODIUM PHOSPHATE 10 MG/ML IJ SOLN
10.0000 mg | Freq: Once | INTRAMUSCULAR | Status: AC
  Administered 2017-08-08: 10 mg via INTRAVENOUS

## 2017-08-08 MED ORDER — SODIUM CHLORIDE 0.9 % IV SOLN
Freq: Once | INTRAVENOUS | Status: AC
Start: 2017-08-08 — End: 2017-08-08
  Administered 2017-08-08: 14:00:00 via INTRAVENOUS
  Filled 2017-08-08: qty 1000

## 2017-08-08 MED ORDER — SODIUM CHLORIDE 0.9 % IV SOLN
80.0000 mg/m2 | Freq: Once | INTRAVENOUS | Status: AC
  Administered 2017-08-08: 130 mg via INTRAVENOUS
  Filled 2017-08-08: qty 6.5

## 2017-08-08 MED ORDER — HEPARIN SOD (PORK) LOCK FLUSH 100 UNIT/ML IV SOLN
500.0000 [IU] | Freq: Once | INTRAVENOUS | Status: AC | PRN
  Administered 2017-08-08: 500 [IU]

## 2017-08-08 MED ORDER — PEGFILGRASTIM 6 MG/0.6ML ~~LOC~~ PSKT
6.0000 mg | PREFILLED_SYRINGE | Freq: Once | SUBCUTANEOUS | Status: AC
  Administered 2017-08-08: 6 mg via SUBCUTANEOUS

## 2017-08-10 NOTE — Progress Notes (Signed)
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  HISTORY OF PRESENT ILLNESS:  Patient returns to clinic today for further evaluation and to assess her toleration of cycle 1 of carboplatinum and etoposide.  She had some mild nausea and diarrhea for several days after treatment, but these have resolved and she feels nearly back to her baseline.  The ecchymosis on her face are still evident, but significantly improved.  She does not complain of pain today. She has no neurologic complaints.  She denies any recent fevers or illnesses. She has a good appetite and denies weight loss.  She denies any chest pain, cough, hemoptysis, or shortness of breath. She has no urinary complaints. Patient offers no further specific complaints today.   REVIEW OF SYSTEMS:    Review of Systems  Constitutional: Negative.  Negative for fever, malaise/fatigue and weight loss.  HENT: Negative.   Eyes: Negative.  Negative for blurred vision, double vision and pain.  Respiratory: Negative.  Negative for cough and shortness of breath.   Cardiovascular: Negative.  Negative for chest pain and leg swelling.  Gastrointestinal: Positive for diarrhea and nausea. Negative for abdominal pain.  Genitourinary: Negative.   Musculoskeletal: Negative.  Negative for falls.  Skin: Negative.  Negative for rash.  Neurological: Negative.  Negative for sensory change and weakness.  Psychiatric/Behavioral: Negative.  The patient is not nervous/anxious.   All other systems reviewed and are negative.   PAST MEDICAL HISTORY: Past  Medical History:  Diagnosis Date   Anemia 02/2013   F/U with Dr. Grayland Ormond for Feraheme injections   Anxiety    Arthritis    Possible RA - Follow up appt with Dr. Jefm Bryant   Cellulitis of arm, right    Diabetes insipidus ([place])    On desmopressin   GERD (gastroesophageal reflux disease)    H/O seasonal allergies    H/O transfusion of packed red blood cells 02/2013   3 units PRBC for severe anemia 02/2013   Herniated disc    History of kidney stones    Hyperlipidemia    Hypertension    Hyperthyroidism    s/p radiation therapy, now hypothyroidism   IBS (irritable bowel syndrome)    Migraine    Pacemaker    a. s/p successful implantation of a Medtronic CapSureFix Novus MRI SureScan serial # [id number] on 06/28/16 by Dr. Caryl Comes for sinus node dysfunction.   Pancreatic cyst    Paroxysmal A-fib [facility])    Renal cell cancer, right ([place]) 10/23/2015   Right partial nephrectomy.    PAST SURGICAL HISTORY: Past Surgical History:  Procedure Laterality Date   ABDOMINAL HYSTERECTOMY     BREAST EXCISIONAL BIOPSY Right 2005   neg   BREAST SURGERY  1990s   Biopsy   CERVICAL SPINE SURGERY     Bone fusion   COLONOSCOPY  2008?   [place]   COLONOSCOPY WITH PROPOFOL N/A 10/15/2016   Procedure: COLONOSCOPY WITH PROPOFOL;  Surgeon: Robert Bellow, MD;  Location: [facility] ENDOSCOPY;  Service: Endoscopy;  Laterality: N/A;   EP IMPLANTABLE DEVICE N/A 06/28/2016   Procedure: Pacemaker Implant;  Surgeon: Remo Lipps  Peterson Lombard, MD;  Location: [facility] CV LAB;  Service: Cardiovascular;  Laterality: N/A;   HAMMER TOE SURGERY     HERNIA REPAIR Left    Inguinal Hernia Repair   PORTA CATH INSERTION N/A 07/24/2017   Procedure: PORTA CATH INSERTION;  Surgeon: Algernon Huxley, MD;  Location: [place] CV LAB;  Service: Cardiovascular;  Laterality: N/A;   ROBOTIC ASSITED PARTIAL NEPHRECTOMY Right 10/23/2015   Procedure: ROBOTIC ASSITED PARTIAL NEPHRECTOMY with intraop ultrasound;   Surgeon: Hollice Espy, MD;  Location: [facility] ORS;  Service: Urology;  Laterality: Right;   TUBAL LIGATION      FAMILY HISTORY Family History  Problem Relation Age of Onset   Heart disease Mother    Heart disease Father    Diabetes Brother    Kidney disease Brother    Stroke Daughter        due to medication reaction   Breast cancer Cousin    Prostate cancer Neg Hx    Hematuria Neg Hx     ADVANCED DIRECTIVES:  No flowsheet data found.  HEALTH MAINTENANCE: Social History   Tobacco Use   Smoking status: Former Smoker    Packs/day: 0.50    Years: 30.00    Pack years: 15.00    Types: Cigarettes    Last attempt to quit: 12/03/2012    Years since quitting: 4.7   Smokeless tobacco: Never Used  Substance Use Topics   Alcohol use: No    Alcohol/week: 0.0 oz   Drug use: No     Allergies  Allergen Reactions   Metrizamide     Other reaction(s): Other (See Comments)   Adhesive [Tape] Other (See Comments)    "irritate skin"   Codeine     REACTION: NAUSEA   Morphine And Related Other (See Comments)    Shut down her organs   Other     Skin irritation   Tetanus Toxoid     REACTION: ARM SWELLING,REDNESS   Tramadol Nausea And Vomiting    Current Outpatient Medications  Medication Sig Dispense Refill   acetaminophen (TYLENOL) 325 MG tablet Take 2 tablets (650 mg total) by mouth every 6 (six) hours as needed for pain.     ALPRAZolam (XANAX) 0.25 MG tablet TAKE ONE TABLET BY MOUTH TWICE A DAY AS NEEDED FOR ANXIETY 60 tablet 1   amLODipine (NORVASC) 10 MG tablet Take by mouth.     brimonidine (ALPHAGAN) 0.15 % ophthalmic solution      brimonidine (ALPHAGAN) 0.2 % ophthalmic solution Place 1 drop into both eyes 2 (two) times daily.     Calcium-Vitamin D (SUPER CALCIUM/D) 600-125 MG-UNIT TABS Take 2 tablets by mouth daily.      carvedilol (COREG) 6.25 MG tablet Take 1 tablet (6.25 mg total) 2 (two) times daily by mouth. 180 tablet 3   Cholecalciferol  (D3-1000 PO) Take 1,000 Units by mouth daily. Reported on 07/28/2015     dorzolamide (TRUSOPT) 2 % ophthalmic solution      ferrous sulfate 325 (65 FE) MG tablet Take 325 mg by mouth daily with breakfast.     folic acid (FOLVITE) 1 MG tablet Take by mouth.     gabapentin (NEURONTIN) 300 MG capsule TAKE ONE CAPSULE BY MOUTH THREE TIMES A DAY AS NEEDED SHINGLES PAIN 90 capsule 1   hydrALAZINE (APRESOLINE) 10 MG tablet Take by mouth.     HYDROcodone-acetaminophen (NORCO/VICODIN) 5-325 MG tablet      hydroxychloroquine (PLAQUENIL) 200 MG tablet Take 400 mg by  mouth daily.      latanoprost (XALATAN) 0.005 % ophthalmic solution Place 1 drop into both eyes at bedtime.      levothyroxine (SYNTHROID, LEVOTHROID) 112 MCG tablet TAKE 1 TABLET  BY MOUTH DAILY BEFORE BREAKFAST. 90 tablet 0   lidocaine-prilocaine (EMLA) cream Apply to affected area once 30 g 3   losartan (COZAAR) 100 MG tablet TAKE ONE TABLET BY MOUTH DAILY. NOTE DOSE CHANGED TO 100MG!! 90 tablet 1   mirabegron ER (MYRBETRIQ) 50 MG TB24 tablet Take 1 tablet (50 mg total) by mouth daily. 30 tablet 2   mirtazapine (REMERON) 7.5 MG tablet TAKE 1 TABLET (7.5 MG TOTAL) BY MOUTH AT BEDTIME. 30 tablet 0   nitroGLYCERIN (NITROLINGUAL) 0.4 MG/SPRAY spray Place 1 spray under the tongue every 5 (five) minutes x 3 doses as needed for chest pain. 4.9 g 0   Omega-3 Fatty Acids (FISH OIL) 1000 MG CAPS Take 1,000 mg by mouth daily.      ondansetron (ZOFRAN) 8 MG tablet Take 1 tablet (8 mg total) by mouth 2 (two) times daily as needed for refractory nausea / vomiting. 30 tablet 2   pantoprazole (PROTONIX) 40 MG tablet TAKE ONE TABLET BY MOUTH DAILY 90 tablet 0   potassium chloride SA (K-DUR,KLOR-CON) 20 MEQ tablet Take 1 tablet (20 mEq total) by mouth daily. 30 tablet 3   prochlorperazine (COMPAZINE) 10 MG tablet Take 1 tablet (10 mg total) by mouth every 6 (six) hours as needed (Nausea or vomiting). 60 tablet 2   rosuvastatin (CRESTOR) 5  MG tablet      timolol (TIMOPTIC) 0.5 % ophthalmic solution      vitamin C (ASCORBIC ACID) 500 MG tablet Take 500 mg by mouth daily.     No current facility-administered medications for this visit.     OBJECTIVE:  Vitals:   08/12/17 1022  BP: 131/68  Pulse: 70  Resp: 20  Temp: (!) 97.2 F (36.2 C)     Body mass index is 25.31 kg/m.    ECOG FS:0 - Asymptomatic  Physical Exam   General: Well-developed, well-nourished, no acute distress. Eyes: Pink conjunctiva, anicteric sclera. HEENT: Swelling and ecchymosis surrounding both eyes, significantly improved. Lungs: Clear to auscultation bilaterally. Heart: Regular rate and rhythm. No rubs, murmurs, or gallops. Abdomen: Soft, nontender, nondistended. No organomegaly noted, normoactive bowel sounds. Musculoskeletal: No edema, cyanosis, or clubbing.  Right arm in splint secondary to fracture. Neuro: Alert, answering all questions appropriately. Cranial nerves grossly intact. Skin: No rashes or petechiae noted. Psych: Normal affect.   LAB RESULTS:  Appointment on 08/12/2017  Component Date Value Ref Range Status   Sodium 08/12/2017 138  135 - 145 mmol/L Final   Potassium 08/12/2017 3.6  3.5 - 5.1 mmol/L Final   Chloride 08/12/2017 103  101 - 111 mmol/L Final   CO2 08/12/2017 26  22 - 32 mmol/L Final   Glucose, Bld 08/12/2017 151* 65 - 99 mg/dL Final   BUN 08/12/2017 24* 6 - 20 mg/dL Final   Creatinine, Ser 08/12/2017 0.75  0.44 - 1.00 mg/dL Final   Calcium 08/12/2017 8.9  8.9 - 10.3 mg/dL Final   Total Protein 08/12/2017 7.2  6.5 - 8.1 g/dL Final   Albumin 08/12/2017 3.8  3.5 - 5.0 g/dL Final   AST 08/12/2017 27  15 - 41 U/L Final   ALT 08/12/2017 19  14 - 54 U/L Final   Alkaline Phosphatase 08/12/2017 97  38 - 126 U/L Final   Total Bilirubin 08/12/2017 0.6  0.3 - 1.2 mg/dL Final   GFR calc non Af Amer 08/12/2017 >60  >60 mL/min Final   GFR calc Af Amer 08/12/2017 >60  >60 mL/min Final   Comment:  (NOTE) The eGFR has been calculated using the CKD EPI equation. This calculation has not been validated in all clinical situations. eGFR's persistently <60 mL/min signify possible Chronic Kidney Disease.    Anion gap 08/12/2017 9  5 - 15 Final   Performed at [facility], [address] [place], [place] [address]   WBC 08/12/2017 23.3* 3.6 - 11.0 K/uL Final   RBC 08/12/2017 3.28* 3.80 - 5.20 MIL/uL Final   Hemoglobin 08/12/2017 9.6* 12.0 - 16.0 g/dL Final   HCT 08/12/2017 29.4* 35.0 - 47.0 % Final   MCV 08/12/2017 89.8  80.0 - 100.0 fL Final   MCH 08/12/2017 29.3  26.0 - 34.0 pg Final   MCHC 08/12/2017 32.6  32.0 - 36.0 g/dL Final   RDW 08/12/2017 16.0* 11.5 - 14.5 % Final   Platelets 08/12/2017 74* 150 - 440 K/uL Final   Neutrophils Relative % 08/12/2017 95  % Final   Neutro Abs 08/12/2017 22.2* 1.4 - 6.5 K/uL Final   Lymphocytes Relative 08/12/2017 4  % Final   Lymphs Abs 08/12/2017 0.8* 1.0 - 3.6 K/uL Final   Monocytes Relative 08/12/2017 1  % Final   Monocytes Absolute 08/12/2017 0.2  0.2 - 0.9 K/uL Final   Eosinophils Relative 08/12/2017 0  % Final   Eosinophils Absolute 08/12/2017 0.0  0 - 0.7 K/uL Final   Basophils Relative 08/12/2017 0  % Final   Basophils Absolute 08/12/2017 0.1  0 - 0.1 K/uL Final   Smear Review 08/12/2017 CONSISTANT WITH NEULASTA ADMIN ON 08/08/2017    Final   Performed at [facility], [address], [place], [place] [address]       STUDIES: No results found.  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  Plan:  1. 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  Patient will benefit from concurrent chemotherapy and XRT.  Recent CT scan of her head after her fall did not reveal metastatic disease. Patient will receive carboplatinum and etoposide  on day 1 and then etoposide only on days 2 and 3.  Will give Neulasta for cycle 1 given her baseline neutropenia, but this may have to be discontinued during XRT which will start on August 18, 2017.  She will receive carboplatinum instead of cisplatin given her history of a partial nephrectomy.  Patient tolerated cycle 1, day 1 of carboplatinum and etoposide last week with only minimal nausea and diarrhea.  Return to clinic in 2 weeks for consideration of cycle 2. 2.  Pancytopenia: Previously, bone marrow biopsy revealed no evidence of MDS or other abnormality.  It is possible it is related to her Plaquenil or rheumatoid arthritis. The remainder of her laboratory work was either negative or within normal limits. No intervention is needed at this time.  Neulasta as above. 3. Renal cell carcinoma of the right kidney: Status post partial nephrectomy. Continue follow-up with urology as indicated. 4. Rheumatoid arthritis: Continue Plaquenil. Continue follow-up per rheumatology.  5.  Fracture right forearm: Continue follow-up with orthopedics as indicated.  Approximately 30 minutes was spent in discussion of which greater than 50% was consultation.  Patient expressed understanding and was in agreement with this plan.  She also understands that She can call clinic at any time with any questions, concerns, or complaints.    Lloyd Huger, MD   08/16/2017 7:54 AM

## 2017-08-12 ENCOUNTER — Other Ambulatory Visit: Payer: Self-pay

## 2017-08-12 ENCOUNTER — Inpatient Hospital Stay (HOSPITAL_BASED_OUTPATIENT_CLINIC_OR_DEPARTMENT_OTHER): Payer: Medicare Other | Admitting: Oncology

## 2017-08-12 ENCOUNTER — Encounter: Payer: Self-pay | Admitting: Oncology

## 2017-08-12 ENCOUNTER — Inpatient Hospital Stay: Payer: Medicare Other

## 2017-08-12 VITALS — BP 131/68 | HR 70 | Temp 97.2°F | Resp 20 | Wt 138.4 lb

## 2017-08-12 DIAGNOSIS — C7A8 Other malignant neuroendocrine tumors: Secondary | ICD-10-CM

## 2017-08-12 DIAGNOSIS — E785 Hyperlipidemia, unspecified: Secondary | ICD-10-CM | POA: Diagnosis not present

## 2017-08-12 DIAGNOSIS — Z87442 Personal history of urinary calculi: Secondary | ICD-10-CM

## 2017-08-12 DIAGNOSIS — I480 Paroxysmal atrial fibrillation: Secondary | ICD-10-CM

## 2017-08-12 DIAGNOSIS — K219 Gastro-esophageal reflux disease without esophagitis: Secondary | ICD-10-CM

## 2017-08-12 DIAGNOSIS — R58 Hemorrhage, not elsewhere classified: Secondary | ICD-10-CM | POA: Diagnosis not present

## 2017-08-12 DIAGNOSIS — E890 Postprocedural hypothyroidism: Secondary | ICD-10-CM | POA: Diagnosis not present

## 2017-08-12 DIAGNOSIS — C641 Malignant neoplasm of right kidney, except renal pelvis: Secondary | ICD-10-CM | POA: Diagnosis not present

## 2017-08-12 DIAGNOSIS — Z803 Family history of malignant neoplasm of breast: Secondary | ICD-10-CM

## 2017-08-12 DIAGNOSIS — Z79899 Other long term (current) drug therapy: Secondary | ICD-10-CM

## 2017-08-12 DIAGNOSIS — S42301D Unspecified fracture of shaft of humerus, right arm, subsequent encounter for fracture with routine healing: Secondary | ICD-10-CM

## 2017-08-12 DIAGNOSIS — R110 Nausea: Secondary | ICD-10-CM | POA: Diagnosis not present

## 2017-08-12 DIAGNOSIS — D61818 Other pancytopenia: Secondary | ICD-10-CM

## 2017-08-12 DIAGNOSIS — Z950 Presence of cardiac pacemaker: Secondary | ICD-10-CM

## 2017-08-12 DIAGNOSIS — Z90710 Acquired absence of both cervix and uterus: Secondary | ICD-10-CM

## 2017-08-12 DIAGNOSIS — R197 Diarrhea, unspecified: Secondary | ICD-10-CM | POA: Diagnosis not present

## 2017-08-12 DIAGNOSIS — M1990 Unspecified osteoarthritis, unspecified site: Secondary | ICD-10-CM

## 2017-08-12 DIAGNOSIS — M069 Rheumatoid arthritis, unspecified: Secondary | ICD-10-CM

## 2017-08-12 DIAGNOSIS — X58XXXD Exposure to other specified factors, subsequent encounter: Secondary | ICD-10-CM

## 2017-08-12 DIAGNOSIS — Z87891 Personal history of nicotine dependence: Secondary | ICD-10-CM

## 2017-08-12 DIAGNOSIS — R911 Solitary pulmonary nodule: Secondary | ICD-10-CM | POA: Diagnosis not present

## 2017-08-12 DIAGNOSIS — Z7689 Persons encountering health services in other specified circumstances: Secondary | ICD-10-CM | POA: Diagnosis not present

## 2017-08-12 DIAGNOSIS — I10 Essential (primary) hypertension: Secondary | ICD-10-CM

## 2017-08-12 DIAGNOSIS — Z5111 Encounter for antineoplastic chemotherapy: Secondary | ICD-10-CM | POA: Diagnosis not present

## 2017-08-12 DIAGNOSIS — E232 Diabetes insipidus: Secondary | ICD-10-CM

## 2017-08-12 LAB — CBC WITH DIFFERENTIAL/PLATELET
Basophils Absolute: 0.1 10*3/uL (ref 0–0.1)
Basophils Relative: 0 %
EOS PCT: 0 %
Eosinophils Absolute: 0 10*3/uL (ref 0–0.7)
HEMATOCRIT: 29.4 % — AB (ref 35.0–47.0)
Hemoglobin: 9.6 g/dL — ABNORMAL LOW (ref 12.0–16.0)
LYMPHS ABS: 0.8 10*3/uL — AB (ref 1.0–3.6)
LYMPHS PCT: 4 %
MCH: 29.3 pg (ref 26.0–34.0)
MCHC: 32.6 g/dL (ref 32.0–36.0)
MCV: 89.8 fL (ref 80.0–100.0)
MONO ABS: 0.2 10*3/uL (ref 0.2–0.9)
MONOS PCT: 1 %
Neutro Abs: 22.2 10*3/uL — ABNORMAL HIGH (ref 1.4–6.5)
Neutrophils Relative %: 95 %
Platelets: 74 10*3/uL — ABNORMAL LOW (ref 150–440)
RBC: 3.28 MIL/uL — AB (ref 3.80–5.20)
RDW: 16 % — AB (ref 11.5–14.5)
WBC: 23.3 10*3/uL — ABNORMAL HIGH (ref 3.6–11.0)

## 2017-08-12 LAB — COMPREHENSIVE METABOLIC PANEL
ALBUMIN: 3.8 g/dL (ref 3.5–5.0)
ALT: 19 U/L (ref 14–54)
AST: 27 U/L (ref 15–41)
Alkaline Phosphatase: 97 U/L (ref 38–126)
Anion gap: 9 (ref 5–15)
BUN: 24 mg/dL — ABNORMAL HIGH (ref 6–20)
CHLORIDE: 103 mmol/L (ref 101–111)
CO2: 26 mmol/L (ref 22–32)
CREATININE: 0.75 mg/dL (ref 0.44–1.00)
Calcium: 8.9 mg/dL (ref 8.9–10.3)
GFR calc Af Amer: >60 mL/min (ref >60)
GFR calc non Af Amer: >60 mL/min (ref >60)
GLUCOSE: 151 mg/dL — AB (ref 65–99)
POTASSIUM: 3.6 mmol/L (ref 3.5–5.1)
SODIUM: 138 mmol/L (ref 135–145)
Total Bilirubin: 0.6 mg/dL (ref 0.3–1.2)
Total Protein: 7.2 g/dL (ref 6.5–8.1)

## 2017-08-12 NOTE — Progress Notes (Signed)
Patient here today for follow up after receiving her first chemotherapy treatment. Patient reports she had some nausea and diarrhea after treatments, has since cleared up.

## 2017-08-13 DIAGNOSIS — M129 Arthropathy, unspecified: Secondary | ICD-10-CM | POA: Diagnosis not present

## 2017-08-13 DIAGNOSIS — C7A1 Malignant poorly differentiated neuroendocrine tumors: Secondary | ICD-10-CM | POA: Diagnosis not present

## 2017-08-13 DIAGNOSIS — L0390 Cellulitis, unspecified: Secondary | ICD-10-CM | POA: Diagnosis not present

## 2017-08-13 DIAGNOSIS — Z87891 Personal history of nicotine dependence: Secondary | ICD-10-CM | POA: Diagnosis not present

## 2017-08-13 DIAGNOSIS — F419 Anxiety disorder, unspecified: Secondary | ICD-10-CM | POA: Diagnosis not present

## 2017-08-13 DIAGNOSIS — Z510 Encounter for antineoplastic radiation therapy: Secondary | ICD-10-CM | POA: Diagnosis not present

## 2017-08-13 DIAGNOSIS — D649 Anemia, unspecified: Secondary | ICD-10-CM | POA: Diagnosis not present

## 2017-08-16 ENCOUNTER — Other Ambulatory Visit: Payer: Self-pay | Admitting: Oncology

## 2017-08-18 ENCOUNTER — Ambulatory Visit
Admission: RE | Admit: 2017-08-18 | Discharge: 2017-08-18 | Disposition: A | Discharge status: Home / Self Care | Payer: Medicare Other | Source: Ambulatory Visit | Attending: Radiation Oncology | Admitting: Radiation Oncology

## 2017-08-18 ENCOUNTER — Telehealth: Payer: Self-pay | Admitting: Urology

## 2017-08-18 DIAGNOSIS — D649 Anemia, unspecified: Secondary | ICD-10-CM | POA: Diagnosis not present

## 2017-08-18 DIAGNOSIS — M129 Arthropathy, unspecified: Secondary | ICD-10-CM | POA: Diagnosis not present

## 2017-08-18 DIAGNOSIS — Z87891 Personal history of nicotine dependence: Secondary | ICD-10-CM | POA: Diagnosis not present

## 2017-08-18 DIAGNOSIS — F419 Anxiety disorder, unspecified: Secondary | ICD-10-CM | POA: Diagnosis not present

## 2017-08-18 DIAGNOSIS — C7A1 Malignant poorly differentiated neuroendocrine tumors: Secondary | ICD-10-CM | POA: Diagnosis not present

## 2017-08-18 DIAGNOSIS — Z510 Encounter for antineoplastic radiation therapy: Secondary | ICD-10-CM | POA: Diagnosis not present

## 2017-08-18 DIAGNOSIS — L0390 Cellulitis, unspecified: Secondary | ICD-10-CM | POA: Diagnosis not present

## 2017-08-18 NOTE — Telephone Encounter (Signed)
Patient is calling and asking for more samples of mybretriq please call her back  Barry

## 2017-08-19 ENCOUNTER — Ambulatory Visit
Admission: RE | Admit: 2017-08-19 | Discharge: 2017-08-19 | Disposition: A | Discharge status: Home / Self Care | Payer: Medicare Other | Source: Ambulatory Visit | Attending: Radiation Oncology | Admitting: Radiation Oncology

## 2017-08-19 DIAGNOSIS — D649 Anemia, unspecified: Secondary | ICD-10-CM | POA: Diagnosis not present

## 2017-08-19 DIAGNOSIS — L0390 Cellulitis, unspecified: Secondary | ICD-10-CM | POA: Diagnosis not present

## 2017-08-19 DIAGNOSIS — Z87891 Personal history of nicotine dependence: Secondary | ICD-10-CM | POA: Diagnosis not present

## 2017-08-19 DIAGNOSIS — C7A1 Malignant poorly differentiated neuroendocrine tumors: Secondary | ICD-10-CM | POA: Diagnosis not present

## 2017-08-19 DIAGNOSIS — F419 Anxiety disorder, unspecified: Secondary | ICD-10-CM | POA: Diagnosis not present

## 2017-08-19 DIAGNOSIS — M129 Arthropathy, unspecified: Secondary | ICD-10-CM | POA: Diagnosis not present

## 2017-08-19 DIAGNOSIS — Z510 Encounter for antineoplastic radiation therapy: Secondary | ICD-10-CM | POA: Diagnosis not present

## 2017-08-20 ENCOUNTER — Other Ambulatory Visit: Payer: Self-pay | Admitting: *Deleted

## 2017-08-20 ENCOUNTER — Ambulatory Visit
Admission: RE | Admit: 2017-08-20 | Discharge: 2017-08-20 | Disposition: A | Discharge status: Home / Self Care | Payer: Medicare Other | Source: Ambulatory Visit | Attending: Radiation Oncology | Admitting: Radiation Oncology

## 2017-08-20 DIAGNOSIS — C7A1 Malignant poorly differentiated neuroendocrine tumors: Secondary | ICD-10-CM | POA: Diagnosis not present

## 2017-08-20 DIAGNOSIS — F419 Anxiety disorder, unspecified: Secondary | ICD-10-CM | POA: Diagnosis not present

## 2017-08-20 DIAGNOSIS — M129 Arthropathy, unspecified: Secondary | ICD-10-CM | POA: Diagnosis not present

## 2017-08-20 DIAGNOSIS — Z510 Encounter for antineoplastic radiation therapy: Secondary | ICD-10-CM | POA: Diagnosis not present

## 2017-08-20 DIAGNOSIS — Z87891 Personal history of nicotine dependence: Secondary | ICD-10-CM | POA: Diagnosis not present

## 2017-08-20 DIAGNOSIS — L0390 Cellulitis, unspecified: Secondary | ICD-10-CM | POA: Diagnosis not present

## 2017-08-20 DIAGNOSIS — D649 Anemia, unspecified: Secondary | ICD-10-CM | POA: Diagnosis not present

## 2017-08-20 MED ORDER — MIRTAZAPINE 15 MG PO TABS: 15.0000 mg | ORAL_TABLET | Freq: Every day | ORAL | 2 refills | Status: DC

## 2017-08-20 NOTE — Telephone Encounter (Signed)
Pt requesting refill of remeron. Pt states was taking 7.5mg  2 tablets at bedtime as instructed by MD. Pt informed that refill can be called into pharmacy for 15mg  tablets so she will only need to take 1 tablet at bedtime. Pt requests refill be sent to [place].

## 2017-08-21 ENCOUNTER — Ambulatory Visit
Admission: RE | Admit: 2017-08-21 | Discharge: 2017-08-21 | Disposition: A | Discharge status: Home / Self Care | Payer: Medicare Other | Source: Ambulatory Visit | Attending: Radiation Oncology | Admitting: Radiation Oncology

## 2017-08-21 ENCOUNTER — Telehealth: Payer: Self-pay | Admitting: *Deleted

## 2017-08-21 DIAGNOSIS — C7A1 Malignant poorly differentiated neuroendocrine tumors: Secondary | ICD-10-CM | POA: Diagnosis not present

## 2017-08-21 DIAGNOSIS — D649 Anemia, unspecified: Secondary | ICD-10-CM | POA: Diagnosis not present

## 2017-08-21 DIAGNOSIS — Z510 Encounter for antineoplastic radiation therapy: Secondary | ICD-10-CM | POA: Diagnosis not present

## 2017-08-21 DIAGNOSIS — L0390 Cellulitis, unspecified: Secondary | ICD-10-CM | POA: Diagnosis not present

## 2017-08-21 DIAGNOSIS — M129 Arthropathy, unspecified: Secondary | ICD-10-CM | POA: Diagnosis not present

## 2017-08-21 DIAGNOSIS — F419 Anxiety disorder, unspecified: Secondary | ICD-10-CM | POA: Diagnosis not present

## 2017-08-21 DIAGNOSIS — Z87891 Personal history of nicotine dependence: Secondary | ICD-10-CM | POA: Diagnosis not present

## 2017-08-21 NOTE — Telephone Encounter (Signed)
Spoke with pt's daughter, Rise Paganini, to inform her that the [facility] will pick up at [place] since radiation time changed to 9:30am. Informed Rise Paganini that the time could not be adjusted in Epic so pt's MyChart will still show 1:45pm pick up time but the [facility] will pick up at 9am instead per [facility]. Beverly verbalized understanding.

## 2017-08-22 ENCOUNTER — Ambulatory Visit
Admission: RE | Admit: 2017-08-22 | Discharge: 2017-08-22 | Disposition: A | Discharge status: Home / Self Care | Payer: Medicare Other | Source: Ambulatory Visit | Attending: Radiation Oncology | Admitting: Radiation Oncology

## 2017-08-22 DIAGNOSIS — C7A1 Malignant poorly differentiated neuroendocrine tumors: Secondary | ICD-10-CM | POA: Diagnosis not present

## 2017-08-22 DIAGNOSIS — Z510 Encounter for antineoplastic radiation therapy: Secondary | ICD-10-CM | POA: Diagnosis not present

## 2017-08-22 DIAGNOSIS — L0390 Cellulitis, unspecified: Secondary | ICD-10-CM | POA: Diagnosis not present

## 2017-08-22 DIAGNOSIS — M129 Arthropathy, unspecified: Secondary | ICD-10-CM | POA: Diagnosis not present

## 2017-08-22 DIAGNOSIS — Z87891 Personal history of nicotine dependence: Secondary | ICD-10-CM | POA: Diagnosis not present

## 2017-08-22 DIAGNOSIS — F419 Anxiety disorder, unspecified: Secondary | ICD-10-CM | POA: Diagnosis not present

## 2017-08-22 DIAGNOSIS — D649 Anemia, unspecified: Secondary | ICD-10-CM | POA: Diagnosis not present

## 2017-08-25 ENCOUNTER — Ambulatory Visit
Admission: RE | Admit: 2017-08-25 | Discharge: 2017-08-25 | Disposition: A | Discharge status: Home / Self Care | Payer: Medicare Other | Source: Ambulatory Visit | Attending: Radiation Oncology | Admitting: Radiation Oncology

## 2017-08-25 DIAGNOSIS — L0390 Cellulitis, unspecified: Secondary | ICD-10-CM | POA: Diagnosis not present

## 2017-08-25 DIAGNOSIS — D649 Anemia, unspecified: Secondary | ICD-10-CM | POA: Diagnosis not present

## 2017-08-25 DIAGNOSIS — M129 Arthropathy, unspecified: Secondary | ICD-10-CM | POA: Diagnosis not present

## 2017-08-25 DIAGNOSIS — C7A1 Malignant poorly differentiated neuroendocrine tumors: Secondary | ICD-10-CM | POA: Diagnosis not present

## 2017-08-25 DIAGNOSIS — F419 Anxiety disorder, unspecified: Secondary | ICD-10-CM | POA: Diagnosis not present

## 2017-08-25 DIAGNOSIS — Z510 Encounter for antineoplastic radiation therapy: Secondary | ICD-10-CM | POA: Diagnosis not present

## 2017-08-25 DIAGNOSIS — Z87891 Personal history of nicotine dependence: Secondary | ICD-10-CM | POA: Diagnosis not present

## 2017-08-25 NOTE — Progress Notes (Signed)
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  HISTORY OF PRESENT ILLNESS:  Patient returns to clinic today for further evaluation and consideration of cycle 2, day 1 of carboplatinum and etoposide.  She had some mild nausea and diarrhea for several days after cycle 1, but otherwise tolerated her treatment well.  Her injuries on her face from her fall have now resolved.  She does not complain of pain today. She has no neurologic complaints.  She denies any recent fevers or illnesses. She has a good appetite and denies weight loss.  She denies any chest pain, cough, hemoptysis, or shortness of breath. She has no urinary complaints. Patient offers no specific complaints today.   REVIEW OF SYSTEMS:    Review of Systems  Constitutional: Negative.  Negative for fever, malaise/fatigue and weight loss.  HENT: Negative.   Eyes: Negative.  Negative for blurred vision, double vision and pain.  Respiratory: Negative.  Negative for cough and shortness of breath.   Cardiovascular: Negative.  Negative for chest pain and leg swelling.  Gastrointestinal: Negative.  Negative for abdominal pain, diarrhea and nausea.  Genitourinary: Negative.   Musculoskeletal: Negative.  Negative for falls.  Skin: Negative.  Negative for rash.  Neurological: Negative.  Negative for sensory change and weakness.  Psychiatric/Behavioral: Negative.  The patient is not nervous/anxious.   All other systems reviewed and are negative.   PAST MEDICAL HISTORY: Past Medical History:  Diagnosis Date   Anemia 02/2013   F/U with Dr. Grayland Ormond for Feraheme injections   Anxiety    Arthritis    Possible RA - Follow up appt with Dr. Jefm Bryant   Cellulitis of arm, right    Diabetes insipidus ([place])    On desmopressin   GERD (gastroesophageal reflux disease)    H/O seasonal allergies    H/O transfusion of packed red blood cells 02/2013   3 units PRBC for severe anemia 02/2013   Herniated disc    History of kidney stones    Hyperlipidemia    Hypertension    Hyperthyroidism    s/p radiation therapy, now hypothyroidism   IBS (irritable bowel syndrome)    Migraine    Pacemaker    a. s/p successful implantation of a Medtronic CapSureFix Novus MRI SureScan serial # [id number] on 06/28/16 by Dr. Caryl Comes for sinus node dysfunction.   Pancreatic cyst    Paroxysmal A-fib [facility])    Renal cell cancer, right ([place]) 10/23/2015   Right partial nephrectomy.    PAST SURGICAL HISTORY: Past Surgical History:  Procedure Laterality Date   ABDOMINAL HYSTERECTOMY     BREAST EXCISIONAL BIOPSY Right 2005   neg   BREAST SURGERY  1990s   Biopsy   CERVICAL SPINE SURGERY     Bone fusion   COLONOSCOPY  2008?   [place]   COLONOSCOPY WITH PROPOFOL N/A 10/15/2016   Procedure: COLONOSCOPY WITH PROPOFOL;  Surgeon: Robert Bellow, MD;  Location: [facility] ENDOSCOPY;  Service: Endoscopy;  Laterality: N/A;   EP IMPLANTABLE DEVICE N/A 06/28/2016   Procedure: Pacemaker Implant;  Surgeon: Deboraha Sprang, MD;  Location: [place] [facility]  LAB;  Service: Cardiovascular;  Laterality: N/A;   HAMMER TOE SURGERY     HERNIA REPAIR Left    Inguinal Hernia Repair   PORTA CATH INSERTION N/A 07/24/2017   Procedure: PORTA CATH INSERTION;  Surgeon: Algernon Huxley, MD;  Location: [place] [facility];  Service: Cardiovascular;  Laterality: N/A;   ROBOTIC ASSITED PARTIAL NEPHRECTOMY Right 10/23/2015   Procedure: ROBOTIC ASSITED PARTIAL NEPHRECTOMY with intraop ultrasound;  Surgeon: Hollice Espy, MD;  Location: [facility] ORS;   Service: Urology;  Laterality: Right;   TUBAL LIGATION      FAMILY HISTORY Family History  Problem Relation Age of Onset   Heart disease Mother    Heart disease Father    Diabetes Brother    Kidney disease Brother    Stroke Daughter        due to medication reaction   Breast cancer Cousin    Prostate cancer Neg Hx    Hematuria Neg Hx     ADVANCED DIRECTIVES:  No flowsheet data found.  HEALTH MAINTENANCE: Social History   Tobacco Use   Smoking status: Former Smoker    Packs/day: 0.50    Years: 30.00    Pack years: 15.00    Types: Cigarettes    Last attempt to quit: 12/03/2012    Years since quitting: 4.7   Smokeless tobacco: Never Used  Substance Use Topics   Alcohol use: No    Alcohol/week: 0.0 oz   Drug use: No     Allergies  Allergen Reactions   Metrizamide     Other reaction(s): Other (See Comments)   Adhesive [Tape] Other (See Comments)    "irritate skin"   Codeine     REACTION: NAUSEA   Morphine And Related Other (See Comments)    Shut down her organs   Other     Skin irritation   Tetanus Toxoid     REACTION: ARM SWELLING,REDNESS   Tramadol Nausea And Vomiting    Current Outpatient Medications  Medication Sig Dispense Refill   acetaminophen (TYLENOL) 325 MG tablet Take 2 tablets (650 mg total) by mouth every 6 (six) hours as needed for pain.     ALPRAZolam (XANAX) 0.25 MG tablet TAKE ONE TABLET BY MOUTH TWICE A DAY AS NEEDED FOR ANXIETY 60 tablet 1   amLODipine (NORVASC) 10 MG tablet Take by mouth.     brimonidine (ALPHAGAN) 0.15 % ophthalmic solution      brimonidine (ALPHAGAN) 0.2 % ophthalmic solution Place 1 drop into both eyes 2 (two) times daily.     Calcium-Vitamin D (SUPER CALCIUM/D) 600-125 MG-UNIT TABS Take 2 tablets by mouth daily.      carvedilol (COREG) 6.25 MG tablet Take 1 tablet (6.25 mg total) 2 (two) times daily by mouth. 180 tablet 3   Cholecalciferol (D3-1000 PO) Take 1,000 Units by mouth daily.  Reported on 07/28/2015     dorzolamide (TRUSOPT) 2 % ophthalmic solution      ferrous sulfate 325 (65 FE) MG tablet Take 325 mg by mouth daily with breakfast.     folic acid (FOLVITE) 1 MG tablet Take by mouth.     gabapentin (NEURONTIN) 300 MG capsule TAKE ONE CAPSULE BY MOUTH THREE TIMES A DAY AS NEEDED SHINGLES PAIN 90 capsule 1   hydrALAZINE (APRESOLINE) 10 MG tablet Take by mouth.     HYDROcodone-acetaminophen (NORCO/VICODIN) 5-325 MG tablet      hydroxychloroquine (PLAQUENIL) 200 MG tablet Take 400 mg by mouth daily.      latanoprost (  XALATAN) 0.005 % ophthalmic solution Place 1 drop into both eyes at bedtime.      levothyroxine (SYNTHROID, LEVOTHROID) 112 MCG tablet TAKE 1 TABLET  BY MOUTH DAILY BEFORE BREAKFAST. 90 tablet 0   lidocaine-prilocaine (EMLA) cream Apply to affected area once 30 g 3   losartan (COZAAR) 100 MG tablet TAKE ONE TABLET BY MOUTH DAILY. NOTE DOSE CHANGED TO '100MG'$ !! 90 tablet 1   mirabegron ER (MYRBETRIQ) 50 MG TB24 tablet Take 1 tablet (50 mg total) by mouth daily. 30 tablet 2   mirtazapine (REMERON) 15 MG tablet Take 1 tablet (15 mg total) by mouth at bedtime. 30 tablet 2   nitroGLYCERIN (NITROLINGUAL) 0.4 MG/SPRAY spray Place 1 spray under the tongue every 5 (five) minutes x 3 doses as needed for chest pain. 4.9 g 0   Omega-3 Fatty Acids (FISH OIL) 1000 MG CAPS Take 1,000 mg by mouth daily.      ondansetron (ZOFRAN) 8 MG tablet Take 1 tablet (8 mg total) by mouth 2 (two) times daily as needed for refractory nausea / vomiting. 30 tablet 2   pantoprazole (PROTONIX) 40 MG tablet TAKE ONE TABLET BY MOUTH DAILY 90 tablet 0   potassium chloride SA (K-DUR,KLOR-CON) 20 MEQ tablet Take 1 tablet (20 mEq total) by mouth daily. 30 tablet 3   prochlorperazine (COMPAZINE) 10 MG tablet Take 1 tablet (10 mg total) by mouth every 6 (six) hours as needed (Nausea or vomiting). 60 tablet 2   rosuvastatin (CRESTOR) 5 MG tablet      timolol (TIMOPTIC) 0.5 %  ophthalmic solution      vitamin C (ASCORBIC ACID) 500 MG tablet Take 500 mg by mouth daily.     No current facility-administered medications for this visit.     OBJECTIVE:  Vitals:   08/27/17 1019  BP: 129/83  Pulse: 75  Resp: 20  Temp: 97.8 F (36.6 C)     Body mass index is 25.15 kg/m.    ECOG FS:0 - Asymptomatic  Physical Exam   General: Well-developed, well-nourished, no acute distress. Eyes: Pink conjunctiva, anicteric sclera. HEENT: Swelling and ecchymosis surrounding both eyes, resolved. Lungs: Clear to auscultation bilaterally. Heart: Regular rate and rhythm. No rubs, murmurs, or gallops. Abdomen: Soft, nontender, nondistended. No organomegaly noted, normoactive bowel sounds. Musculoskeletal: No edema, cyanosis, or clubbing.  Right arm in splint secondary to fracture. Neuro: Alert, answering all questions appropriately. Cranial nerves grossly intact. Skin: No rashes or petechiae noted. Psych: Normal affect.   LAB RESULTS:  Appointment on 08/27/2017  Component Date Value Ref Range Status   WBC 08/27/2017 4.7  3.6 - 11.0 K/uL Final   RBC 08/27/2017 3.60* 3.80 - 5.20 MIL/uL Final   Hemoglobin 08/27/2017 10.4* 12.0 - 16.0 g/dL Final   HCT 08/27/2017 32.1* 35.0 - 47.0 % Final   MCV 08/27/2017 89.0  80.0 - 100.0 fL Final   MCH 08/27/2017 28.9  26.0 - 34.0 pg Final   MCHC 08/27/2017 32.5  32.0 - 36.0 g/dL Final   RDW 08/27/2017 17.1* 11.5 - 14.5 % Final   Platelets 08/27/2017 91* 150 - 440 K/uL Final   Neutrophils Relative % 08/27/2017 74  % Final   Neutro Abs 08/27/2017 3.4  1.4 - 6.5 K/uL Final   Lymphocytes Relative 08/27/2017 12  % Final   Lymphs Abs 08/27/2017 0.6* 1.0 - 3.6 K/uL Final   Monocytes Relative 08/27/2017 14  % Final   Monocytes Absolute 08/27/2017 0.7  0.2 - 0.9 K/uL Final   Eosinophils Relative  08/27/2017 0  % Final   Eosinophils Absolute 08/27/2017 0.0  0 - 0.7 K/uL Final   Basophils Relative 08/27/2017 0  % Final    Basophils Absolute 08/27/2017 0.0  0 - 0.1 K/uL Final   Performed at [facility], [address] [place], [place] [address]   Sodium 08/27/2017 138  135 - 145 mmol/L Final   Potassium 08/27/2017 3.3* 3.5 - 5.1 mmol/L Final   Chloride 08/27/2017 107  101 - 111 mmol/L Final   CO2 08/27/2017 27  22 - 32 mmol/L Final   Glucose, Bld 08/27/2017 102* 65 - 99 mg/dL Final   BUN 08/27/2017 16  6 - 20 mg/dL Final   Creatinine, Ser 08/27/2017 0.90  0.44 - 1.00 mg/dL Final   Calcium 08/27/2017 9.1  8.9 - 10.3 mg/dL Final   Total Protein 08/27/2017 7.3  6.5 - 8.1 g/dL Final   Albumin 08/27/2017 4.0  3.5 - 5.0 g/dL Final   AST 08/27/2017 30  15 - 41 U/L Final   ALT 08/27/2017 20  14 - 54 U/L Final   Alkaline Phosphatase 08/27/2017 87  38 - 126 U/L Final   Total Bilirubin 08/27/2017 0.3  0.3 - 1.2 mg/dL Final   GFR calc non Af Amer 08/27/2017 >60  >60 mL/min Final   GFR calc Af Amer 08/27/2017 >60  >60 mL/min Final   Comment: (NOTE) The eGFR has been calculated using the CKD EPI equation. This calculation has not been validated in all clinical situations. eGFR's persistently <60 mL/min signify possible Chronic Kidney Disease.    Anion gap 08/27/2017 4* 5 - 15 Final   Performed at [facility], [address] [place], [place] [address]       STUDIES: No results found.  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  Plan:  1. 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  Patient will benefit from concurrent chemotherapy and XRT.  Recent CT scan of her head after her fall did not reveal metastatic disease. Patient will receive carboplatinum and etoposide on day 1 and then etoposide only on days 2 and 3 with Neulasta support.  She will complete IMRT on August 29, 2017.  She will receive  carboplatinum instead of cisplatin given her history of a partial nephrectomy.  Proceed with cycle 2, day 1 of carboplatinum and etoposide today despite mild thrombocytopenia.  Return to clinic in 1 and 2 days for etoposide only and then in 3 weeks for consideration of cycle 2. 2.  Pancytopenia: Previously, bone marrow biopsy revealed no evidence of MDS or other abnormality.  It is possible it is related to her Plaquenil or rheumatoid arthritis. The remainder of her laboratory work was either negative or within normal limits. No intervention is needed at this time.  Proceed with treatment and Neulasta as above. 3. Renal cell carcinoma of the right kidney: Status post partial nephrectomy. Continue follow-up with urology as indicated. 4. Rheumatoid arthritis: Continue Plaquenil. Continue follow-up per rheumatology.  5.  Fracture right forearm: Continue follow-up with orthopedics as indicated. 6.  Thrombocytopenia: Proceed with treatment as above.  Approximately 30 minutes was spent in discussion of which greater than 50% was consultation.  Patient expressed understanding and was in agreement with this plan. She also understands that She can call clinic at any time with any questions, concerns, or complaints.  Lloyd Huger, MD   08/27/2017 10:58 AM

## 2017-08-26 ENCOUNTER — Ambulatory Visit
Admission: RE | Admit: 2017-08-26 | Discharge: 2017-08-26 | Disposition: A | Discharge status: Home / Self Care | Payer: Medicare Other | Source: Ambulatory Visit | Attending: Radiation Oncology | Admitting: Radiation Oncology

## 2017-08-26 DIAGNOSIS — M129 Arthropathy, unspecified: Secondary | ICD-10-CM | POA: Diagnosis not present

## 2017-08-26 DIAGNOSIS — Z510 Encounter for antineoplastic radiation therapy: Secondary | ICD-10-CM | POA: Diagnosis not present

## 2017-08-26 DIAGNOSIS — C7A1 Malignant poorly differentiated neuroendocrine tumors: Secondary | ICD-10-CM | POA: Diagnosis not present

## 2017-08-26 DIAGNOSIS — Z87891 Personal history of nicotine dependence: Secondary | ICD-10-CM | POA: Diagnosis not present

## 2017-08-26 DIAGNOSIS — L0390 Cellulitis, unspecified: Secondary | ICD-10-CM | POA: Diagnosis not present

## 2017-08-26 DIAGNOSIS — F419 Anxiety disorder, unspecified: Secondary | ICD-10-CM | POA: Diagnosis not present

## 2017-08-26 DIAGNOSIS — D649 Anemia, unspecified: Secondary | ICD-10-CM | POA: Diagnosis not present

## 2017-08-27 ENCOUNTER — Encounter: Payer: Self-pay | Admitting: Oncology

## 2017-08-27 ENCOUNTER — Inpatient Hospital Stay: Payer: Medicare Other

## 2017-08-27 ENCOUNTER — Ambulatory Visit
Admission: RE | Admit: 2017-08-27 | Discharge: 2017-08-27 | Disposition: A | Discharge status: Home / Self Care | Payer: Medicare Other | Source: Ambulatory Visit | Attending: Radiation Oncology | Admitting: Radiation Oncology

## 2017-08-27 ENCOUNTER — Inpatient Hospital Stay (HOSPITAL_BASED_OUTPATIENT_CLINIC_OR_DEPARTMENT_OTHER): Payer: Medicare Other | Admitting: Oncology

## 2017-08-27 ENCOUNTER — Other Ambulatory Visit: Payer: Self-pay

## 2017-08-27 VITALS — BP 129/83 | HR 75 | Temp 97.8°F | Resp 20 | Wt 137.5 lb

## 2017-08-27 DIAGNOSIS — Z90710 Acquired absence of both cervix and uterus: Secondary | ICD-10-CM

## 2017-08-27 DIAGNOSIS — Z803 Family history of malignant neoplasm of breast: Secondary | ICD-10-CM

## 2017-08-27 DIAGNOSIS — Z5111 Encounter for antineoplastic chemotherapy: Secondary | ICD-10-CM | POA: Diagnosis not present

## 2017-08-27 DIAGNOSIS — D61818 Other pancytopenia: Secondary | ICD-10-CM | POA: Diagnosis not present

## 2017-08-27 DIAGNOSIS — C7A1 Malignant poorly differentiated neuroendocrine tumors: Secondary | ICD-10-CM | POA: Diagnosis not present

## 2017-08-27 DIAGNOSIS — E785 Hyperlipidemia, unspecified: Secondary | ICD-10-CM | POA: Diagnosis not present

## 2017-08-27 DIAGNOSIS — M069 Rheumatoid arthritis, unspecified: Secondary | ICD-10-CM

## 2017-08-27 DIAGNOSIS — E890 Postprocedural hypothyroidism: Secondary | ICD-10-CM

## 2017-08-27 DIAGNOSIS — C7A8 Other malignant neuroendocrine tumors: Secondary | ICD-10-CM

## 2017-08-27 DIAGNOSIS — Z87891 Personal history of nicotine dependence: Secondary | ICD-10-CM

## 2017-08-27 DIAGNOSIS — K219 Gastro-esophageal reflux disease without esophagitis: Secondary | ICD-10-CM

## 2017-08-27 DIAGNOSIS — E232 Diabetes insipidus: Secondary | ICD-10-CM

## 2017-08-27 DIAGNOSIS — C641 Malignant neoplasm of right kidney, except renal pelvis: Secondary | ICD-10-CM | POA: Diagnosis not present

## 2017-08-27 DIAGNOSIS — I480 Paroxysmal atrial fibrillation: Secondary | ICD-10-CM | POA: Diagnosis not present

## 2017-08-27 DIAGNOSIS — F419 Anxiety disorder, unspecified: Secondary | ICD-10-CM | POA: Diagnosis not present

## 2017-08-27 DIAGNOSIS — L0390 Cellulitis, unspecified: Secondary | ICD-10-CM | POA: Diagnosis not present

## 2017-08-27 DIAGNOSIS — I10 Essential (primary) hypertension: Secondary | ICD-10-CM

## 2017-08-27 DIAGNOSIS — Z7689 Persons encountering health services in other specified circumstances: Secondary | ICD-10-CM | POA: Diagnosis not present

## 2017-08-27 DIAGNOSIS — Z950 Presence of cardiac pacemaker: Secondary | ICD-10-CM

## 2017-08-27 DIAGNOSIS — Z79899 Other long term (current) drug therapy: Secondary | ICD-10-CM

## 2017-08-27 DIAGNOSIS — M1990 Unspecified osteoarthritis, unspecified site: Secondary | ICD-10-CM

## 2017-08-27 DIAGNOSIS — Z510 Encounter for antineoplastic radiation therapy: Secondary | ICD-10-CM | POA: Diagnosis not present

## 2017-08-27 DIAGNOSIS — M129 Arthropathy, unspecified: Secondary | ICD-10-CM | POA: Diagnosis not present

## 2017-08-27 DIAGNOSIS — D649 Anemia, unspecified: Secondary | ICD-10-CM | POA: Diagnosis not present

## 2017-08-27 DIAGNOSIS — R911 Solitary pulmonary nodule: Secondary | ICD-10-CM | POA: Diagnosis not present

## 2017-08-27 DIAGNOSIS — Z87442 Personal history of urinary calculi: Secondary | ICD-10-CM

## 2017-08-27 LAB — COMPREHENSIVE METABOLIC PANEL
ALT: 20 U/L (ref 14–54)
AST: 30 U/L (ref 15–41)
Albumin: 4 g/dL (ref 3.5–5.0)
Alkaline Phosphatase: 87 U/L (ref 38–126)
Anion gap: 4 — ABNORMAL LOW (ref 5–15)
BUN: 16 mg/dL (ref 6–20)
CO2: 27 mmol/L (ref 22–32)
Calcium: 9.1 mg/dL (ref 8.9–10.3)
Chloride: 107 mmol/L (ref 101–111)
Creatinine, Ser: 0.9 mg/dL (ref 0.44–1.00)
Glucose, Bld: 102 mg/dL — ABNORMAL HIGH (ref 65–99)
POTASSIUM: 3.3 mmol/L — AB (ref 3.5–5.1)
Sodium: 138 mmol/L (ref 135–145)
TOTAL PROTEIN: 7.3 g/dL (ref 6.5–8.1)
Total Bilirubin: 0.3 mg/dL (ref 0.3–1.2)

## 2017-08-27 LAB — CBC WITH DIFFERENTIAL/PLATELET
BASOS ABS: 0 10*3/uL (ref 0–0.1)
Basophils Relative: 0 %
Eosinophils Absolute: 0 10*3/uL (ref 0–0.7)
Eosinophils Relative: 0 %
HEMATOCRIT: 32.1 % — AB (ref 35.0–47.0)
Hemoglobin: 10.4 g/dL — ABNORMAL LOW (ref 12.0–16.0)
LYMPHS ABS: 0.6 10*3/uL — AB (ref 1.0–3.6)
LYMPHS PCT: 12 %
MCH: 28.9 pg (ref 26.0–34.0)
MCHC: 32.5 g/dL (ref 32.0–36.0)
MCV: 89 fL (ref 80.0–100.0)
Monocytes Absolute: 0.7 10*3/uL (ref 0.2–0.9)
Monocytes Relative: 14 %
NEUTROS ABS: 3.4 10*3/uL (ref 1.4–6.5)
Neutrophils Relative %: 74 %
Platelets: 91 10*3/uL — ABNORMAL LOW (ref 150–440)
RBC: 3.6 MIL/uL — ABNORMAL LOW (ref 3.80–5.20)
RDW: 17.1 % — AB (ref 11.5–14.5)
WBC: 4.7 10*3/uL (ref 3.6–11.0)

## 2017-08-27 MED ORDER — PALONOSETRON HCL INJECTION 0.25 MG/5ML
0.2500 mg | Freq: Once | INTRAVENOUS | Status: AC
  Administered 2017-08-27: 0.25 mg via INTRAVENOUS
  Filled 2017-08-27: qty 5

## 2017-08-27 MED ORDER — SODIUM CHLORIDE 0.9 % IV SOLN: 10.0000 mg | Freq: Once | INTRAVENOUS | Status: DC

## 2017-08-27 MED ORDER — SODIUM CHLORIDE 0.9 % IV SOLN: 365.5000 mg | Freq: Once | INTRAVENOUS | Status: DC

## 2017-08-27 MED ORDER — SODIUM CHLORIDE 0.9 % IV SOLN
365.5000 mg | Freq: Once | INTRAVENOUS | Status: AC
  Administered 2017-08-27: 370 mg via INTRAVENOUS
  Filled 2017-08-27: qty 37

## 2017-08-27 MED ORDER — SODIUM CHLORIDE 0.9 % IV SOLN
80.0000 mg/m2 | Freq: Once | INTRAVENOUS | Status: AC
  Administered 2017-08-27: 130 mg via INTRAVENOUS
  Filled 2017-08-27: qty 6.5

## 2017-08-27 MED ORDER — SODIUM CHLORIDE 0.9 % IV SOLN
Freq: Once | INTRAVENOUS | Status: AC
  Administered 2017-08-27: 11:00:00 via INTRAVENOUS
  Filled 2017-08-27: qty 1000

## 2017-08-27 MED ORDER — DEXAMETHASONE SODIUM PHOSPHATE 10 MG/ML IJ SOLN
10.0000 mg | Freq: Once | INTRAMUSCULAR | Status: AC
  Administered 2017-08-27: 10 mg via INTRAVENOUS
  Filled 2017-08-27: qty 1

## 2017-08-27 MED ORDER — HEPARIN SOD (PORK) LOCK FLUSH 100 UNIT/ML IV SOLN
500.0000 [IU] | Freq: Once | INTRAVENOUS | Status: AC | PRN
  Administered 2017-08-27: 500 [IU]
  Filled 2017-08-27: qty 5

## 2017-08-27 NOTE — Progress Notes (Signed)
Patient denies any concerns today.  

## 2017-08-28 ENCOUNTER — Ambulatory Visit
Admission: RE | Admit: 2017-08-28 | Discharge: 2017-08-28 | Disposition: A | Discharge status: Home / Self Care | Payer: Medicare Other | Source: Ambulatory Visit | Attending: Radiation Oncology | Admitting: Radiation Oncology

## 2017-08-28 ENCOUNTER — Inpatient Hospital Stay: Payer: Medicare Other

## 2017-08-28 VITALS — BP 130/81 | HR 76 | Temp 98.0°F | Resp 18

## 2017-08-28 DIAGNOSIS — L0390 Cellulitis, unspecified: Secondary | ICD-10-CM | POA: Diagnosis not present

## 2017-08-28 DIAGNOSIS — Z5111 Encounter for antineoplastic chemotherapy: Secondary | ICD-10-CM | POA: Diagnosis not present

## 2017-08-28 DIAGNOSIS — C7A8 Other malignant neuroendocrine tumors: Secondary | ICD-10-CM

## 2017-08-28 DIAGNOSIS — R911 Solitary pulmonary nodule: Secondary | ICD-10-CM | POA: Diagnosis not present

## 2017-08-28 DIAGNOSIS — Z87891 Personal history of nicotine dependence: Secondary | ICD-10-CM | POA: Diagnosis not present

## 2017-08-28 DIAGNOSIS — C7A1 Malignant poorly differentiated neuroendocrine tumors: Secondary | ICD-10-CM | POA: Diagnosis not present

## 2017-08-28 DIAGNOSIS — Z510 Encounter for antineoplastic radiation therapy: Secondary | ICD-10-CM | POA: Diagnosis not present

## 2017-08-28 DIAGNOSIS — D649 Anemia, unspecified: Secondary | ICD-10-CM | POA: Diagnosis not present

## 2017-08-28 DIAGNOSIS — C641 Malignant neoplasm of right kidney, except renal pelvis: Secondary | ICD-10-CM | POA: Diagnosis not present

## 2017-08-28 DIAGNOSIS — M129 Arthropathy, unspecified: Secondary | ICD-10-CM | POA: Diagnosis not present

## 2017-08-28 DIAGNOSIS — D61818 Other pancytopenia: Secondary | ICD-10-CM | POA: Diagnosis not present

## 2017-08-28 DIAGNOSIS — F419 Anxiety disorder, unspecified: Secondary | ICD-10-CM | POA: Diagnosis not present

## 2017-08-28 DIAGNOSIS — Z7689 Persons encountering health services in other specified circumstances: Secondary | ICD-10-CM | POA: Diagnosis not present

## 2017-08-28 MED ORDER — SODIUM CHLORIDE 0.9 % IV SOLN
Freq: Once | INTRAVENOUS | Status: AC
  Administered 2017-08-28: 12:00:00 via INTRAVENOUS
  Filled 2017-08-28: qty 1000

## 2017-08-28 MED ORDER — HEPARIN SOD (PORK) LOCK FLUSH 100 UNIT/ML IV SOLN
INTRAVENOUS | Status: AC
  Filled 2017-08-28: qty 5

## 2017-08-28 MED ORDER — DEXAMETHASONE SODIUM PHOSPHATE 10 MG/ML IJ SOLN
10.0000 mg | Freq: Once | INTRAMUSCULAR | Status: AC
  Administered 2017-08-28: 10 mg via INTRAVENOUS
  Filled 2017-08-28: qty 1

## 2017-08-28 MED ORDER — SODIUM CHLORIDE 0.9 % IV SOLN
80.0000 mg/m2 | Freq: Once | INTRAVENOUS | Status: AC
  Administered 2017-08-28: 130 mg via INTRAVENOUS
  Filled 2017-08-28: qty 6.5

## 2017-08-29 ENCOUNTER — Inpatient Hospital Stay: Payer: Medicare Other

## 2017-08-29 ENCOUNTER — Ambulatory Visit
Admission: RE | Admit: 2017-08-29 | Discharge: 2017-08-29 | Disposition: A | Discharge status: Home / Self Care | Payer: Medicare Other | Source: Ambulatory Visit | Attending: Radiation Oncology | Admitting: Radiation Oncology

## 2017-08-29 VITALS — BP 133/82 | HR 80 | Resp 20

## 2017-08-29 DIAGNOSIS — Z87891 Personal history of nicotine dependence: Secondary | ICD-10-CM | POA: Diagnosis not present

## 2017-08-29 DIAGNOSIS — D649 Anemia, unspecified: Secondary | ICD-10-CM | POA: Diagnosis not present

## 2017-08-29 DIAGNOSIS — Z7689 Persons encountering health services in other specified circumstances: Secondary | ICD-10-CM | POA: Diagnosis not present

## 2017-08-29 DIAGNOSIS — F419 Anxiety disorder, unspecified: Secondary | ICD-10-CM | POA: Diagnosis not present

## 2017-08-29 DIAGNOSIS — C641 Malignant neoplasm of right kidney, except renal pelvis: Secondary | ICD-10-CM | POA: Diagnosis not present

## 2017-08-29 DIAGNOSIS — R911 Solitary pulmonary nodule: Secondary | ICD-10-CM | POA: Diagnosis not present

## 2017-08-29 DIAGNOSIS — M129 Arthropathy, unspecified: Secondary | ICD-10-CM | POA: Diagnosis not present

## 2017-08-29 DIAGNOSIS — C7A1 Malignant poorly differentiated neuroendocrine tumors: Secondary | ICD-10-CM | POA: Diagnosis not present

## 2017-08-29 DIAGNOSIS — C7A8 Other malignant neuroendocrine tumors: Secondary | ICD-10-CM | POA: Diagnosis not present

## 2017-08-29 DIAGNOSIS — Z5111 Encounter for antineoplastic chemotherapy: Secondary | ICD-10-CM | POA: Diagnosis not present

## 2017-08-29 DIAGNOSIS — D61818 Other pancytopenia: Secondary | ICD-10-CM | POA: Diagnosis not present

## 2017-08-29 DIAGNOSIS — L0390 Cellulitis, unspecified: Secondary | ICD-10-CM | POA: Diagnosis not present

## 2017-08-29 DIAGNOSIS — Z510 Encounter for antineoplastic radiation therapy: Secondary | ICD-10-CM | POA: Diagnosis not present

## 2017-08-29 MED ORDER — DEXAMETHASONE SODIUM PHOSPHATE 10 MG/ML IJ SOLN
10.0000 mg | Freq: Once | INTRAMUSCULAR | Status: AC
  Administered 2017-08-29: 10 mg via INTRAVENOUS
  Filled 2017-08-29: qty 1

## 2017-08-29 MED ORDER — SODIUM CHLORIDE 0.9 % IV SOLN
Freq: Once | INTRAVENOUS | Status: AC
  Administered 2017-08-29: 10:00:00 via INTRAVENOUS
  Filled 2017-08-29: qty 1000

## 2017-08-29 MED ORDER — SODIUM CHLORIDE 0.9 % IV SOLN
80.0000 mg/m2 | Freq: Once | INTRAVENOUS | Status: AC
  Administered 2017-08-29: 130 mg via INTRAVENOUS
  Filled 2017-08-29: qty 6.5

## 2017-08-29 MED ORDER — SODIUM CHLORIDE 0.9 % IV SOLN: 10.0000 mg | Freq: Once | INTRAVENOUS | Status: DC

## 2017-08-29 MED ORDER — PEGFILGRASTIM 6 MG/0.6ML ~~LOC~~ PSKT
6.0000 mg | PREFILLED_SYRINGE | Freq: Once | SUBCUTANEOUS | Status: AC
  Administered 2017-08-29: 6 mg via SUBCUTANEOUS
  Filled 2017-08-29: qty 0.6

## 2017-08-29 MED ORDER — HEPARIN SOD (PORK) LOCK FLUSH 100 UNIT/ML IV SOLN
500.0000 [IU] | Freq: Once | INTRAVENOUS | Status: AC | PRN
  Administered 2017-08-29: 500 [IU]
  Filled 2017-08-29: qty 5

## 2017-09-05 ENCOUNTER — Encounter: Payer: Self-pay | Admitting: Urology

## 2017-09-09 ENCOUNTER — Other Ambulatory Visit: Payer: Self-pay | Admitting: Internal Medicine

## 2017-09-10 ENCOUNTER — Inpatient Hospital Stay: Payer: Medicare Other | Attending: Oncology | Admitting: Oncology

## 2017-09-10 ENCOUNTER — Telehealth: Payer: Self-pay | Admitting: *Deleted

## 2017-09-10 VITALS — BP 119/79 | HR 76 | Temp 97.4°F | Resp 18 | Wt 137.1 lb

## 2017-09-10 DIAGNOSIS — I480 Paroxysmal atrial fibrillation: Secondary | ICD-10-CM | POA: Diagnosis not present

## 2017-09-10 DIAGNOSIS — E890 Postprocedural hypothyroidism: Secondary | ICD-10-CM

## 2017-09-10 DIAGNOSIS — Z950 Presence of cardiac pacemaker: Secondary | ICD-10-CM | POA: Diagnosis not present

## 2017-09-10 DIAGNOSIS — E785 Hyperlipidemia, unspecified: Secondary | ICD-10-CM | POA: Insufficient documentation

## 2017-09-10 DIAGNOSIS — J0110 Acute frontal sinusitis, unspecified: Secondary | ICD-10-CM | POA: Diagnosis not present

## 2017-09-10 DIAGNOSIS — R5383 Other fatigue: Secondary | ICD-10-CM | POA: Insufficient documentation

## 2017-09-10 DIAGNOSIS — R197 Diarrhea, unspecified: Secondary | ICD-10-CM | POA: Diagnosis not present

## 2017-09-10 DIAGNOSIS — C641 Malignant neoplasm of right kidney, except renal pelvis: Secondary | ICD-10-CM | POA: Diagnosis not present

## 2017-09-10 DIAGNOSIS — I10 Essential (primary) hypertension: Secondary | ICD-10-CM | POA: Insufficient documentation

## 2017-09-10 DIAGNOSIS — Z7689 Persons encountering health services in other specified circumstances: Secondary | ICD-10-CM | POA: Diagnosis not present

## 2017-09-10 DIAGNOSIS — D61818 Other pancytopenia: Secondary | ICD-10-CM | POA: Diagnosis not present

## 2017-09-10 DIAGNOSIS — M1990 Unspecified osteoarthritis, unspecified site: Secondary | ICD-10-CM | POA: Diagnosis not present

## 2017-09-10 DIAGNOSIS — K219 Gastro-esophageal reflux disease without esophagitis: Secondary | ICD-10-CM | POA: Diagnosis not present

## 2017-09-10 DIAGNOSIS — Z79899 Other long term (current) drug therapy: Secondary | ICD-10-CM | POA: Insufficient documentation

## 2017-09-10 DIAGNOSIS — K521 Toxic gastroenteritis and colitis: Secondary | ICD-10-CM | POA: Insufficient documentation

## 2017-09-10 DIAGNOSIS — E232 Diabetes insipidus: Secondary | ICD-10-CM | POA: Diagnosis not present

## 2017-09-10 DIAGNOSIS — N069 Isolated proteinuria with unspecified morphologic lesion: Secondary | ICD-10-CM | POA: Diagnosis not present

## 2017-09-10 DIAGNOSIS — Z792 Long term (current) use of antibiotics: Secondary | ICD-10-CM | POA: Diagnosis not present

## 2017-09-10 DIAGNOSIS — Z803 Family history of malignant neoplasm of breast: Secondary | ICD-10-CM | POA: Insufficient documentation

## 2017-09-10 DIAGNOSIS — T3695XS Adverse effect of unspecified systemic antibiotic, sequela: Secondary | ICD-10-CM | POA: Diagnosis not present

## 2017-09-10 DIAGNOSIS — R5381 Other malaise: Secondary | ICD-10-CM | POA: Diagnosis not present

## 2017-09-10 DIAGNOSIS — Z87442 Personal history of urinary calculi: Secondary | ICD-10-CM | POA: Insufficient documentation

## 2017-09-10 DIAGNOSIS — Z5111 Encounter for antineoplastic chemotherapy: Secondary | ICD-10-CM | POA: Diagnosis not present

## 2017-09-10 DIAGNOSIS — C7A8 Other malignant neuroendocrine tumors: Secondary | ICD-10-CM | POA: Diagnosis not present

## 2017-09-10 DIAGNOSIS — R110 Nausea: Secondary | ICD-10-CM | POA: Diagnosis not present

## 2017-09-10 DIAGNOSIS — Z905 Acquired absence of kidney: Secondary | ICD-10-CM | POA: Insufficient documentation

## 2017-09-10 DIAGNOSIS — Z90710 Acquired absence of both cervix and uterus: Secondary | ICD-10-CM | POA: Insufficient documentation

## 2017-09-10 DIAGNOSIS — Z87891 Personal history of nicotine dependence: Secondary | ICD-10-CM

## 2017-09-10 MED ORDER — AMOXICILLIN-POT CLAVULANATE 875-125 MG PO TABS: 1.0000 | ORAL_TABLET | Freq: Two times a day (BID) | ORAL | 0 refills | Status: DC

## 2017-09-10 NOTE — Telephone Encounter (Signed)
Pt called in to report that developed a productive cough, nasal congestion, and sore throat since yesterday morning. Pt does not report a fever. Informed pt that will schedule her an appt with the symptom management clinic today at 11am. Pt stated that will find transportation at the assisted living facility she resides at but stated will call the triage nurse if is unable to find transportation to see if one of the [place] vans can pick her up. Pt confirmed appt today at 11am. Nothing further needed at this time.

## 2017-09-10 NOTE — Progress Notes (Signed)
Patient here today for add on visit regarding cough, congestion.

## 2017-09-10 NOTE — Progress Notes (Signed)
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  Patient will benefit from concurrent chemotherapy and XRT.  Recent CT scan of her head after her fall did not reveal metastatic disease. Patient will receive carboplatinum and etoposide on day 1 and then etoposide only on days 2 and 3 with Neulasta support.  She will complete IMRT on August 29, 2017.  She will receive carboplatinum instead of cisplatin given her history of a partial nephrectomy.   Interval history- Patient was last seen by Dr. Grayland Ormond on 08/27/2017 for consideration of cycle 2 day 1 of carboplatinum and etoposide. She endorsed some nausea and diarrhea for several days after cycle 1 but otherwise has tolerated her treatment well. Patient also had a recent fall with injury to her face. CT Imaging did not reveal any injury or metastatic disease. She has had low blood counts previously but a negative bone marrow. It was noted that this could've been related to her Plaquenil or RA.  Patient presents today for a cough that began yesterday evening. It is constant. She denies any fevers but states she is coughing up and has nasal yellow and green discharge. Patient states her cough is so severe that her abdomen is sore from coughing all night. Endorses a sore throat that  began several days before her cough. She has not tried anything OTC because "I do not use OTC medications". She endorses seasonal allergies and occasional sinusitis. Patient is worried about becoming sick because she does not want to miss or have to postpone cycle 3 carbo/etoposide.  ECOG FS:0 - Asymptomatic  Review of systems- Review of Systems  Constitutional: Positive for malaise/fatigue. Negative for chills, fever and weight loss.  HENT: Positive for congestion, sinus pain and sore throat. Negative for ear discharge, ear pain, nosebleeds and tinnitus.   Eyes: Negative.   Respiratory: Positive for cough and sputum production (yellow and green in color). Negative for shortness of breath and wheezing.   Cardiovascular: Negative.   Gastrointestinal: Negative.   Genitourinary: Negative.   Musculoskeletal: Negative.   Skin: Negative.   Neurological: Positive for weakness.  Endo/Heme/Allergies: Negative.   Psychiatric/Behavioral: Negative.      Current treatment- cycle 3 Carbo/etoposide scheduled for 09/17/2017  Allergies  Allergen Reactions   Metrizamide     Other reaction(s): Other (See Comments)   Adhesive [Tape] Other (See Comments)    "irritate skin"   Codeine     REACTION: NAUSEA   Morphine And Related Other (See Comments)    Shut down her organs   Other     Skin irritation   Tetanus Toxoid     REACTION: ARM SWELLING,REDNESS   Tramadol Nausea And Vomiting     Past Medical History:  Diagnosis Date   Anemia 02/2013   F/U with Dr. Grayland Ormond for Feraheme injections   Anxiety    Arthritis    Possible RA - Follow up  appt with Dr. Jefm Bryant   Cellulitis of arm, right    Diabetes insipidus ([place])    On desmopressin   GERD (gastroesophageal reflux disease)    H/O seasonal allergies    H/O transfusion of packed red blood cells 02/2013   3 units PRBC for severe anemia 02/2013   Herniated disc    History of kidney stones    Hyperlipidemia    Hypertension      Hyperthyroidism    s/p radiation therapy, now hypothyroidism   IBS (irritable bowel syndrome)    Migraine    Pacemaker    a. s/p successful implantation of a Medtronic CapSureFix Novus MRI SureScan serial # [id number] on 06/28/16 by Dr. Caryl Comes for sinus node dysfunction.   Pancreatic cyst    Paroxysmal A-fib [facility])    Renal cell cancer, right ([place]) 10/23/2015   Right partial nephrectomy.     Past Surgical History:  Procedure Laterality Date   ABDOMINAL HYSTERECTOMY     BREAST EXCISIONAL BIOPSY Right 2005   neg   BREAST SURGERY  1990s   Biopsy   CERVICAL SPINE SURGERY     Bone fusion   COLONOSCOPY  2008?   [place]   COLONOSCOPY WITH PROPOFOL N/A 10/15/2016   Procedure: COLONOSCOPY WITH PROPOFOL;  Surgeon: Robert Bellow, MD;  Location: [facility] ENDOSCOPY;  Service: Endoscopy;  Laterality: N/A;   EP IMPLANTABLE DEVICE N/A 06/28/2016   Procedure: Pacemaker Implant;  Surgeon: Deboraha Sprang, MD;  Location: [place] CV LAB;  Service: Cardiovascular;  Laterality: N/A;   HAMMER TOE SURGERY     HERNIA REPAIR Left    Inguinal Hernia Repair   PORTA CATH INSERTION N/A 07/24/2017   Procedure: PORTA CATH INSERTION;  Surgeon: Algernon Huxley, MD;  Location: [place] CV LAB;  Service: Cardiovascular;  Laterality: N/A;   ROBOTIC ASSITED PARTIAL NEPHRECTOMY Right 10/23/2015   Procedure: ROBOTIC ASSITED PARTIAL NEPHRECTOMY with intraop ultrasound;  Surgeon: Hollice Espy, MD;  Location: [facility] ORS;  Service: Urology;  Laterality: Right;   TUBAL LIGATION      Social History   Socioeconomic History   Marital status: Widowed    Spouse name: Not on file   Number of children: 4   Years of education: 12   Highest education level: Not on file  Social Needs   Financial resource strain: Not on file   Food insecurity - worry: Not on file   Food insecurity - inability: Not on file   Transportation needs - medical: Not on file   Transportation needs -  non-medical: Not on file  Occupational History   Occupation: Charity fundraiser    Comment: Retired   Occupation: Engineer, 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 Vos was born and reared in [place]. She is a widow since 43. She was married for 48 years. They had 4 children (daughters). She is currently leaving at [facility] since June. 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 She enjoys shopping. She also enjoys music, word puzzles and she loves spending time with her family. She loves attending church. One of her favorite things is wearing hats. She absolutely loves hats!    Family History  Problem Relation Age of Onset   Heart disease Mother    Heart disease Father    Diabetes Brother    Kidney disease Brother    Stroke Daughter        due to medication reaction   Breast cancer Cousin    Prostate cancer Neg Hx    Hematuria Neg Hx      Current Outpatient Medications:    acetaminophen (TYLENOL) 325 MG tablet, Take 2 tablets (650 mg total) by mouth every 6 (six) hours as needed for pain., Disp: , Rfl:    ALPRAZolam (XANAX) 0.25 MG tablet, TAKE ONE TABLET BY MOUTH TWICE A DAY AS NEEDED FOR ANXIETY, Disp: 60 tablet, Rfl: 1   amLODipine (NORVASC) 10 MG tablet, Take by mouth., Disp: , Rfl:    brimonidine (ALPHAGAN) 0.15 % ophthalmic solution, , Disp: , Rfl:    brimonidine (ALPHAGAN) 0.2 % ophthalmic solution, Place 1 drop into both eyes 2 (two) times daily., Disp: , Rfl:     Calcium-Vitamin D (SUPER CALCIUM/D) 600-125 MG-UNIT TABS, Take 2 tablets by mouth daily. , Disp: , Rfl:    carvedilol (COREG) 6.25 MG tablet, Take 1 tablet (6.25 mg total) 2 (two) times daily by mouth., Disp: 180 tablet, Rfl: 3   Cholecalciferol (D3-1000 PO), Take 1,000 Units by mouth daily. Reported on 07/28/2015, Disp: , Rfl:    dorzolamide (TRUSOPT) 2 % ophthalmic solution, , Disp: , Rfl:    ferrous sulfate 325 (65 FE) MG tablet, Take 325 mg by mouth daily with breakfast., Disp: , Rfl:    folic acid (FOLVITE) 1 MG tablet, Take by mouth., Disp: , Rfl:    gabapentin (NEURONTIN) 300 MG capsule, TAKE ONE CAPSULE BY MOUTH THREE TIMES A DAY AS NEEDED SHINGLES PAIN, Disp: 90 capsule, Rfl: 1   hydrALAZINE (APRESOLINE) 10 MG tablet, Take by mouth., Disp: , Rfl:    HYDROcodone-acetaminophen (NORCO/VICODIN) 5-325 MG tablet, , Disp: , Rfl:    hydroxychloroquine (PLAQUENIL) 200 MG tablet, Take 400 mg by mouth daily. , Disp: , Rfl:    latanoprost (XALATAN) 0.005 % ophthalmic solution, Place 1 drop into both eyes at bedtime. , Disp: , Rfl:    levothyroxine (SYNTHROID, LEVOTHROID) 112 MCG tablet, TAKE ONE TABLET BY MOUTH EVERY MORNING BEFORE BREAKFAST, Disp: 90 tablet, Rfl: 0   lidocaine-prilocaine (EMLA) cream, Apply to affected area once, Disp: 30 g, Rfl: 3   losartan (COZAAR) 100 MG tablet, TAKE ONE TABLET BY MOUTH DAILY. NOTE DOSE CHANGED TO 100MG !!, Disp: 90 tablet, Rfl: 1   mirabegron ER (MYRBETRIQ) 50 MG TB24 tablet, Take 1 tablet (50 mg total) by mouth daily., Disp: 30 tablet, Rfl: 2   mirtazapine (REMERON) 15 MG tablet, Take 1 tablet (15 mg total) by mouth at bedtime., Disp: 30 tablet, Rfl: 2   nitroGLYCERIN (NITROLINGUAL) 0.4 MG/SPRAY spray, Place 1 spray under the tongue every 5 (five) minutes x 3 doses as needed for chest pain., Disp: 4.9 g, Rfl: 0   Omega-3 Fatty Acids (FISH OIL) 1000 MG CAPS, Take 1,000 mg by mouth daily. , Disp: , Rfl:    ondansetron (ZOFRAN) 8 MG tablet, Take 1  tablet (8 mg total) by mouth 2 (  two) times daily as needed for refractory nausea / vomiting., Disp: 30 tablet, Rfl: 2   pantoprazole (PROTONIX) 40 MG tablet, TAKE ONE TABLET BY MOUTH DAILY, Disp: 90 tablet, Rfl: 0   potassium chloride SA (K-DUR,KLOR-CON) 20 MEQ tablet, Take 1 tablet (20 mEq total) by mouth daily., Disp: 30 tablet, Rfl: 3   prochlorperazine (COMPAZINE) 10 MG tablet, Take 1 tablet (10 mg total) by mouth every 6 (six) hours as needed (Nausea or vomiting)., Disp: 60 tablet, Rfl: 2   rosuvastatin (CRESTOR) 5 MG tablet, , Disp: , Rfl:    timolol (TIMOPTIC) 0.5 % ophthalmic solution, , Disp: , Rfl:    vitamin C (ASCORBIC ACID) 500 MG tablet, Take 500 mg by mouth daily., Disp: , Rfl:    amoxicillin-clavulanate (AUGMENTIN) 875-125 MG tablet, Take 1 tablet by mouth 2 (two) times daily., Disp: 10 tablet, Rfl: 0  Physical exam:  Vitals:   09/10/17 1109  BP: 119/79  Pulse: 76  Resp: 18  Temp: (!) 97.4 F (36.3 C)  TempSrc: Tympanic  Weight: 137 lb 1.6 oz (62.2 kg)   Physical Exam  Constitutional: She is oriented to person, place, and time and well-developed, well-nourished, and in no distress. Vital signs are normal.  HENT:  Head: Normocephalic and atraumatic.  Nose: Right sinus exhibits maxillary sinus tenderness and frontal sinus tenderness. Left sinus exhibits maxillary sinus tenderness and frontal sinus tenderness.  Mouth/Throat: Mucous membranes are normal. Posterior oropharyngeal erythema present. No posterior oropharyngeal edema.  Eyes: Conjunctivae and EOM are normal. Pupils are equal, round, and reactive to light.  Neck: Normal range of motion. Neck supple.  Cardiovascular: Normal rate, regular rhythm and normal pulses.  Pulmonary/Chest: Effort normal and breath sounds normal.  Abdominal: Soft. Normal appearance and bowel sounds are normal.  Musculoskeletal: Normal range of motion.  Neurological: She is alert and oriented to person, place, and time.  Skin: Skin is  warm and dry.  Psychiatric: Mood, memory, affect and judgment normal.     CMP Latest Ref Rng & Units 08/27/2017  Glucose 65 - 99 mg/dL 102(H)  BUN 6 - 20 mg/dL 16  Creatinine 0.44 - 1.00 mg/dL 0.90  Sodium 135 - 145 mmol/L 138  Potassium 3.5 - 5.1 mmol/L 3.3(L)  Chloride 101 - 111 mmol/L 107  CO2 22 - 32 mmol/L 27  Calcium 8.9 - 10.3 mg/dL 9.1  Total Protein 6.5 - 8.1 g/dL 7.3  Total Bilirubin 0.3 - 1.2 mg/dL 0.3  Alkaline Phos 38 - 126 U/L 87  AST 15 - 41 U/L 30  ALT 14 - 54 U/L 20   CBC Latest Ref Rng & Units 08/27/2017  WBC 3.6 - 11.0 K/uL 4.7  Hemoglobin 12.0 - 16.0 g/dL 10.4(L)  Hematocrit 35.0 - 47.0 % 32.1(L)  Platelets 150 - 440 K/uL 91(L)    No images are attached to the encounter.  No results found.   Assessment and plan- Patient is a 77 y.o. female who presents for a persistent cough for approximately 24 hours. On initial examination patient appears well sitting in exam chair. Vital signs are stable. She is afebrile. Lungs are clear. Heart sounds are normal. Mild maxillary and frontal sinus tenderness. Reports sinusitis annually. Patient cough is persistent with sputum production.   1. Sinusitis: RX Augmentin 875-125 mg tablet twice a day for 5 days. Recommended OTC Claritin. Offered Flonase but patient refused. Also recommended OTC Delsym or Robitussin for her cough. Explained if this does not work patient is to call clinic and let  us know. 2. Return to clinic as scheduled to see Dr. Grayland Ormond next week for cycle 3 carbo/etoposide.   Visit Diagnosis 1. Acute non-recurrent frontal sinusitis   2. Neuroendocrine carcinoma of lung ([place])   3. Renal cell carcinoma of right kidney [facility])     Patient expressed understanding and was in agreement with this plan. She also understands that She can call clinic at any time with any questions, concerns, or complaints.   Greater than 50% was spent in counseling and coordination of care with this patient including but not limited  to discussion of the relevant topics above (See A&P) including, but not limited to diagnosis and management of acute and chronic medical conditions.    Faythe Casa, AGNP-C [facility] [place]- [phone] Pager- [phone] 09/15/2017 3:55 PM

## 2017-09-11 ENCOUNTER — Ambulatory Visit (INDEPENDENT_AMBULATORY_CARE_PROVIDER_SITE_OTHER): Payer: Medicare Other | Admitting: *Deleted

## 2017-09-11 DIAGNOSIS — I495 Sick sinus syndrome: Secondary | ICD-10-CM | POA: Diagnosis not present

## 2017-09-11 NOTE — Progress Notes (Signed)
Remote pacemaker transmission.   

## 2017-09-12 NOTE — Progress Notes (Signed)
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  HISTORY OF PRESENT ILLNESS:  Patient returns to clinic today for further evaluation and consideration of cycle 3, day 1 of carboplatinum and etoposide.  She was seen last week and symptom management clinic with increased cough and shortness of breath, without fever.  She was given antibiotic which caused diarrhea.  She feels weak and fatigued, but states her symptoms have improved. She does not complain of pain today. She has no neurologic complaints. She has a good appetite and denies weight loss.  She denies any chest pain, hemoptysis, or shortness of breath.  She denies any nausea, vomiting, constipation, or diarrhea.  She has no urinary complaints. Patient offers no specific complaints today.   REVIEW OF SYSTEMS:    Review of Systems  Constitutional: Positive for malaise/fatigue. Negative for fever and weight loss.  HENT: Negative.   Eyes: Negative.  Negative for blurred vision, double vision and pain.  Respiratory: Positive for cough. Negative for shortness of breath.   Cardiovascular: Negative.  Negative for chest pain and leg swelling.  Gastrointestinal: Positive for nausea. Negative for abdominal pain and diarrhea.  Genitourinary: Negative.   Musculoskeletal: Negative.  Negative for falls.  Skin: Negative.  Negative for rash.  Neurological: Positive for weakness. Negative for sensory change.  Psychiatric/Behavioral: Negative.  The patient is not nervous/anxious.   All other systems reviewed and are negative.   PAST MEDICAL  HISTORY: Past Medical History:  Diagnosis Date   Anemia 02/2013   F/U with Dr. Grayland Ormond for Feraheme injections   Anxiety    Arthritis    Possible RA - Follow up appt with Dr. Jefm Bryant   Cellulitis of arm, right    Diabetes insipidus ([place])    On desmopressin   GERD (gastroesophageal reflux disease)    H/O seasonal allergies    H/O transfusion of packed red blood cells 02/2013   3 units PRBC for severe anemia 02/2013   Herniated disc    History of kidney stones    Hyperlipidemia    Hypertension    Hyperthyroidism    s/p radiation therapy, now hypothyroidism   IBS (irritable bowel syndrome)    Migraine    Pacemaker    a. s/p successful implantation of a Medtronic CapSureFix Novus MRI SureScan serial # [id number] on 06/28/16 by Dr. Caryl Comes for sinus node dysfunction.   Pancreatic cyst    Paroxysmal A-fib [facility])    Renal cell cancer, right ([place]) 10/23/2015   Right partial nephrectomy.    PAST SURGICAL HISTORY: Past Surgical History:  Procedure Laterality Date   ABDOMINAL HYSTERECTOMY     BREAST EXCISIONAL BIOPSY Right 2005   neg   BREAST SURGERY  1990s   Biopsy   CERVICAL SPINE SURGERY     Bone fusion   COLONOSCOPY  2008?   [place]   COLONOSCOPY WITH PROPOFOL N/A 10/15/2016   Procedure: COLONOSCOPY WITH PROPOFOL;  Surgeon: Robert Bellow, MD;  Location: [facility] ENDOSCOPY;  Service: Endoscopy;  Laterality: N/A;   EP IMPLANTABLE DEVICE N/A 06/28/2016   Procedure: Pacemaker Implant;  Surgeon: Deboraha Sprang,  MD;  Location: [place] [place] CV LAB;  Service: Cardiovascular;  Laterality: N/A;   HAMMER TOE SURGERY     HERNIA REPAIR Left    Inguinal Hernia Repair   PORTA CATH INSERTION N/A 07/24/2017   Procedure: PORTA CATH INSERTION;  Surgeon: Algernon Huxley, MD;  Location: [place] CV LAB;  Service: Cardiovascular;  Laterality: N/A;   ROBOTIC ASSITED PARTIAL NEPHRECTOMY Right 10/23/2015   Procedure: ROBOTIC ASSITED PARTIAL NEPHRECTOMY with  intraop ultrasound;  Surgeon: Hollice Espy, MD;  Location: [facility] ORS;  Service: Urology;  Laterality: Right;   TUBAL LIGATION      FAMILY HISTORY Family History  Problem Relation Age of Onset   Heart disease Mother    Heart disease Father    Diabetes Brother    Kidney disease Brother    Stroke Daughter        due to medication reaction   Breast cancer Cousin    Prostate cancer Neg Hx    Hematuria Neg Hx     ADVANCED DIRECTIVES:  No flowsheet data found.  HEALTH MAINTENANCE: Social History   Tobacco Use   Smoking status: Former Smoker    Packs/day: 0.50    Years: 30.00    Pack years: 15.00    Types: Cigarettes    Last attempt to quit: 12/03/2012    Years since quitting: 4.7   Smokeless tobacco: Never Used  Substance Use Topics   Alcohol use: No    Alcohol/week: 0.0 oz   Drug use: No     Allergies  Allergen Reactions   Metrizamide     Other reaction(s): Other (See Comments)   Adhesive [Tape] Other (See Comments)    "irritate skin"   Augmentin [Amoxicillin-Pot Clavulanate] Diarrhea    Severe diarrhea   Codeine     REACTION: NAUSEA   Morphine And Related Other (See Comments)    Shut down her organs   Other     Skin irritation   Tetanus Toxoid     REACTION: ARM SWELLING,REDNESS   Tramadol Nausea And Vomiting    Current Outpatient Medications  Medication Sig Dispense Refill   acetaminophen (TYLENOL) 325 MG tablet Take 2 tablets (650 mg total) by mouth every 6 (six) hours as needed for pain.     ALPRAZolam (XANAX) 0.25 MG tablet TAKE ONE TABLET BY MOUTH TWICE A DAY AS NEEDED FOR ANXIETY 60 tablet 1   amLODipine (NORVASC) 10 MG tablet Take by mouth.     brimonidine (ALPHAGAN) 0.2 % ophthalmic solution Place 1 drop into both eyes 2 (two) times daily.     Calcium-Vitamin D (SUPER CALCIUM/D) 600-125 MG-UNIT TABS Take 2 tablets by mouth daily.      carvedilol (COREG) 6.25 MG tablet Take 1 tablet (6.25 mg total) 2 (two) times daily by  mouth. 180 tablet 3   Cholecalciferol (D3-1000 PO) Take 1,000 Units by mouth daily. Reported on 07/28/2015     dorzolamide (TRUSOPT) 2 % ophthalmic solution      ferrous sulfate 325 (65 FE) MG tablet Take 325 mg by mouth daily with breakfast.     folic acid (FOLVITE) 1 MG tablet Take by mouth.     gabapentin (NEURONTIN) 300 MG capsule TAKE ONE CAPSULE BY MOUTH THREE TIMES A DAY AS NEEDED SHINGLES PAIN 90 capsule 1   hydrALAZINE (APRESOLINE) 10 MG tablet Take by mouth.     HYDROcodone-acetaminophen (NORCO/VICODIN) 5-325 MG tablet      hydroxychloroquine (PLAQUENIL) 200 MG tablet Take 400 mg by mouth daily.  latanoprost (XALATAN) 0.005 % ophthalmic solution Place 1 drop into both eyes at bedtime.      levothyroxine (SYNTHROID, LEVOTHROID) 112 MCG tablet TAKE ONE TABLET BY MOUTH EVERY MORNING BEFORE BREAKFAST 90 tablet 0   lidocaine-prilocaine (EMLA) cream Apply to affected area once 30 g 3   losartan (COZAAR) 100 MG tablet TAKE ONE TABLET BY MOUTH DAILY. NOTE DOSE CHANGED TO 100MG!! 90 tablet 1   mirabegron ER (MYRBETRIQ) 50 MG TB24 tablet Take 1 tablet (50 mg total) by mouth daily. 30 tablet 2   mirtazapine (REMERON) 15 MG tablet Take 1 tablet (15 mg total) by mouth at bedtime. 30 tablet 2   nitroGLYCERIN (NITROLINGUAL) 0.4 MG/SPRAY spray Place 1 spray under the tongue every 5 (five) minutes x 3 doses as needed for chest pain. 4.9 g 0   Omega-3 Fatty Acids (FISH OIL) 1000 MG CAPS Take 1,000 mg by mouth daily.      ondansetron (ZOFRAN) 8 MG tablet Take 1 tablet (8 mg total) by mouth 2 (two) times daily as needed for refractory nausea / vomiting. 30 tablet 2   pantoprazole (PROTONIX) 40 MG tablet TAKE ONE TABLET BY MOUTH DAILY 90 tablet 0   potassium chloride SA (K-DUR,KLOR-CON) 20 MEQ tablet Take 1 tablet (20 mEq total) by mouth daily. 30 tablet 3   prochlorperazine (COMPAZINE) 10 MG tablet Take 1 tablet (10 mg total) by mouth every 6 (six) hours as needed (Nausea or  vomiting). 60 tablet 2   rosuvastatin (CRESTOR) 5 MG tablet      timolol (TIMOPTIC) 0.5 % ophthalmic solution      vitamin C (ASCORBIC ACID) 500 MG tablet Take 500 mg by mouth daily.     No current facility-administered medications for this visit.     OBJECTIVE:  Vitals:   09/17/17 0838  BP: 129/85  Pulse: 81  Resp: 20  Temp: 98 F (36.7 C)     Body mass index is 24.51 kg/m.    ECOG FS:0 - Asymptomatic  Physical Exam   General: Well-developed, well-nourished, no acute distress. Eyes: Pink conjunctiva, anicteric sclera. HEENT: Swelling and ecchymosis surrounding both eyes, resolved. Lungs: Clear to auscultation bilaterally. Heart: Regular rate and rhythm. No rubs, murmurs, or gallops. Abdomen: Soft, nontender, nondistended. No organomegaly noted, normoactive bowel sounds. Musculoskeletal: No edema, cyanosis, or clubbing.  Right arm in splint secondary to fracture. Neuro: Alert, answering all questions appropriately. Cranial nerves grossly intact. Skin: No rashes or petechiae noted. Psych: Normal affect.   LAB RESULTS:  Infusion on 09/17/2017  Component Date Value Ref Range Status   Sodium 09/17/2017 139  135 - 145 mmol/L Final   Potassium 09/17/2017 3.4* 3.5 - 5.1 mmol/L Final   Chloride 09/17/2017 107  101 - 111 mmol/L Final   CO2 09/17/2017 25  22 - 32 mmol/L Final   Glucose, Bld 09/17/2017 162* 65 - 99 mg/dL Final   BUN 09/17/2017 16  6 - 20 mg/dL Final   Creatinine, Ser 09/17/2017 0.74  0.44 - 1.00 mg/dL Final   Calcium 09/17/2017 9.1  8.9 - 10.3 mg/dL Final   Total Protein 09/17/2017 7.0  6.5 - 8.1 g/dL Final   Albumin 09/17/2017 3.9  3.5 - 5.0 g/dL Final   AST 09/17/2017 27  15 - 41 U/L Final   ALT 09/17/2017 16  14 - 54 U/L Final   Alkaline Phosphatase 09/17/2017 73  38 - 126 U/L Final   Total Bilirubin 09/17/2017 0.5  0.3 - 1.2 mg/dL Final   GFR  calc non Af Amer 09/17/2017 >60  >60 mL/min Final   GFR calc Af Amer 09/17/2017 >60  >60  mL/min Final   Comment: (NOTE) The eGFR has been calculated using the CKD EPI equation. This calculation has not been validated in all clinical situations. eGFR's persistently <60 mL/min signify possible Chronic Kidney Disease.    Anion gap 09/17/2017 7  5 - 15 Final   Performed at [facility], [place], [place] [address]   WBC 09/17/2017 2.6* 3.6 - 11.0 K/uL Final   RBC 09/17/2017 3.05* 3.80 - 5.20 MIL/uL Final   Hemoglobin 09/17/2017 9.0* 12.0 - 16.0 g/dL Final   HCT 09/17/2017 27.2* 35.0 - 47.0 % Final   MCV 09/17/2017 89.1  80.0 - 100.0 fL Final   MCH 09/17/2017 29.4  26.0 - 34.0 pg Final   MCHC 09/17/2017 33.0  32.0 - 36.0 g/dL Final   RDW 09/17/2017 19.1* 11.5 - 14.5 % Final   Platelets 09/17/2017 74* 150 - 440 K/uL Final   Neutrophils Relative % 09/17/2017 54  % Final   Neutro Abs 09/17/2017 1.4  1.4 - 6.5 K/uL Final   Lymphocytes Relative 09/17/2017 18  % Final   Lymphs Abs 09/17/2017 0.5* 1.0 - 3.6 K/uL Final   Monocytes Relative 09/17/2017 27  % Final   Monocytes Absolute 09/17/2017 0.7  0.2 - 0.9 K/uL Final   Eosinophils Relative 09/17/2017 1  % Final   Eosinophils Absolute 09/17/2017 0.0  0 - 0.7 K/uL Final   Basophils Relative 09/17/2017 0  % Final   Basophils Absolute 09/17/2017 0.0  0 - 0.1 K/uL Final   WBC Morphology 09/17/2017 CONSISTANT WITH NEULASTA ADMIN ON 08/29/2017 [facility]   Final   Performed at [facility], [address] [place], [place] [address]       STUDIES: No results found.  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  Plan:  1. 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  Patient will benefit from concurrent chemotherapy and XRT.  Recent CT scan of her head after her fall did not reveal metastatic disease.  Patient will receive carboplatinum and etoposide on day 1 and then  etoposide only on days 2 and 3 with Neulasta support.  She has now completed her daily XRT.  She will receive carboplatinum instead of cisplatin given her history of a partial nephrectomy.  Patient wishes to proceed with cycle 3, day 1 of carboplatinum and etoposide today despite mild thrombocytopenia and leukopenia.  Return to clinic in 1 and 2 days for etoposide only, in 1 week for laboratory work only, and then in 3 weeks for consideration of cycle 4. 2.  Pancytopenia: Previously, bone marrow biopsy revealed no evidence of MDS or other abnormality.  It is possible it is related to her Plaquenil or rheumatoid arthritis. The remainder of her laboratory work was either negative or within normal limits. No intervention is needed at this time.  Proceed with treatment and Neulasta as above. 3. Renal cell carcinoma of the right kidney: Status post partial nephrectomy. Continue follow-up with urology as indicated. 4. Rheumatoid arthritis: Continue Plaquenil. Continue follow-up per rheumatology.  5.  Fracture right forearm: Continue follow-up with orthopedics as indicated. 6.  Thrombocytopenia: Proceed with treatment as above. 7.  Diarrhea: Secondary to Augmentin.  Resolved.  Approximately 30 minutes was spent in discussion of which greater than 50% was consultation.  Patient  expressed understanding and was in agreement with this plan. She also understands that She can call clinic at any time with any questions, concerns, or complaints.    Lloyd Huger, MD   09/19/2017 4:08 PM

## 2017-09-17 ENCOUNTER — Inpatient Hospital Stay (HOSPITAL_BASED_OUTPATIENT_CLINIC_OR_DEPARTMENT_OTHER): Payer: Medicare Other | Admitting: Oncology

## 2017-09-17 ENCOUNTER — Encounter: Payer: Self-pay | Admitting: Cardiology

## 2017-09-17 ENCOUNTER — Inpatient Hospital Stay: Payer: Medicare Other

## 2017-09-17 ENCOUNTER — Encounter: Payer: Self-pay | Admitting: Oncology

## 2017-09-17 VITALS — BP 129/85 | HR 81 | Temp 98.0°F | Resp 20 | Wt 134.0 lb

## 2017-09-17 DIAGNOSIS — K219 Gastro-esophageal reflux disease without esophagitis: Secondary | ICD-10-CM

## 2017-09-17 DIAGNOSIS — C641 Malignant neoplasm of right kidney, except renal pelvis: Secondary | ICD-10-CM

## 2017-09-17 DIAGNOSIS — N069 Isolated proteinuria with unspecified morphologic lesion: Secondary | ICD-10-CM

## 2017-09-17 DIAGNOSIS — I10 Essential (primary) hypertension: Secondary | ICD-10-CM

## 2017-09-17 DIAGNOSIS — R197 Diarrhea, unspecified: Secondary | ICD-10-CM

## 2017-09-17 DIAGNOSIS — Z5111 Encounter for antineoplastic chemotherapy: Secondary | ICD-10-CM | POA: Diagnosis not present

## 2017-09-17 DIAGNOSIS — J0110 Acute frontal sinusitis, unspecified: Secondary | ICD-10-CM

## 2017-09-17 DIAGNOSIS — T3695XS Adverse effect of unspecified systemic antibiotic, sequela: Secondary | ICD-10-CM | POA: Diagnosis not present

## 2017-09-17 DIAGNOSIS — Z7689 Persons encountering health services in other specified circumstances: Secondary | ICD-10-CM | POA: Diagnosis not present

## 2017-09-17 DIAGNOSIS — C7A8 Other malignant neuroendocrine tumors: Secondary | ICD-10-CM | POA: Diagnosis not present

## 2017-09-17 DIAGNOSIS — K521 Toxic gastroenteritis and colitis: Secondary | ICD-10-CM | POA: Diagnosis not present

## 2017-09-17 DIAGNOSIS — Z792 Long term (current) use of antibiotics: Secondary | ICD-10-CM | POA: Diagnosis not present

## 2017-09-17 DIAGNOSIS — Z950 Presence of cardiac pacemaker: Secondary | ICD-10-CM

## 2017-09-17 DIAGNOSIS — Z79899 Other long term (current) drug therapy: Secondary | ICD-10-CM

## 2017-09-17 DIAGNOSIS — E785 Hyperlipidemia, unspecified: Secondary | ICD-10-CM

## 2017-09-17 DIAGNOSIS — Z803 Family history of malignant neoplasm of breast: Secondary | ICD-10-CM

## 2017-09-17 DIAGNOSIS — R110 Nausea: Secondary | ICD-10-CM

## 2017-09-17 DIAGNOSIS — Z87891 Personal history of nicotine dependence: Secondary | ICD-10-CM

## 2017-09-17 DIAGNOSIS — Z905 Acquired absence of kidney: Secondary | ICD-10-CM

## 2017-09-17 DIAGNOSIS — M1990 Unspecified osteoarthritis, unspecified site: Secondary | ICD-10-CM

## 2017-09-17 DIAGNOSIS — I480 Paroxysmal atrial fibrillation: Secondary | ICD-10-CM

## 2017-09-17 DIAGNOSIS — E890 Postprocedural hypothyroidism: Secondary | ICD-10-CM

## 2017-09-17 DIAGNOSIS — D61818 Other pancytopenia: Secondary | ICD-10-CM | POA: Diagnosis not present

## 2017-09-17 DIAGNOSIS — Z90710 Acquired absence of both cervix and uterus: Secondary | ICD-10-CM

## 2017-09-17 DIAGNOSIS — R5383 Other fatigue: Secondary | ICD-10-CM

## 2017-09-17 DIAGNOSIS — E232 Diabetes insipidus: Secondary | ICD-10-CM

## 2017-09-17 DIAGNOSIS — R5381 Other malaise: Secondary | ICD-10-CM | POA: Diagnosis not present

## 2017-09-17 DIAGNOSIS — Z87442 Personal history of urinary calculi: Secondary | ICD-10-CM

## 2017-09-17 LAB — CBC WITH DIFFERENTIAL/PLATELET
BASOS ABS: 0 10*3/uL (ref 0–0.1)
BASOS PCT: 0 %
EOS PCT: 1 %
Eosinophils Absolute: 0 10*3/uL (ref 0–0.7)
HCT: 27.2 % — ABNORMAL LOW (ref 35.0–47.0)
HEMOGLOBIN: 9 g/dL — AB (ref 12.0–16.0)
Lymphocytes Relative: 18 %
Lymphs Abs: 0.5 10*3/uL — ABNORMAL LOW (ref 1.0–3.6)
MCH: 29.4 pg (ref 26.0–34.0)
MCHC: 33 g/dL (ref 32.0–36.0)
MCV: 89.1 fL (ref 80.0–100.0)
MONO ABS: 0.7 10*3/uL (ref 0.2–0.9)
Monocytes Relative: 27 %
NEUTROS ABS: 1.4 10*3/uL (ref 1.4–6.5)
Neutrophils Relative %: 54 %
Platelets: 74 10*3/uL — ABNORMAL LOW (ref 150–440)
RBC: 3.05 MIL/uL — AB (ref 3.80–5.20)
RDW: 19.1 % — AB (ref 11.5–14.5)
WBC: 2.6 10*3/uL — AB (ref 3.6–11.0)

## 2017-09-17 LAB — COMPREHENSIVE METABOLIC PANEL
ALT: 16 U/L (ref 14–54)
AST: 27 U/L (ref 15–41)
Albumin: 3.9 g/dL (ref 3.5–5.0)
Alkaline Phosphatase: 73 U/L (ref 38–126)
Anion gap: 7 (ref 5–15)
BUN: 16 mg/dL (ref 6–20)
CALCIUM: 9.1 mg/dL (ref 8.9–10.3)
CHLORIDE: 107 mmol/L (ref 101–111)
CO2: 25 mmol/L (ref 22–32)
Creatinine, Ser: 0.74 mg/dL (ref 0.44–1.00)
GFR calc non Af Amer: >60 mL/min (ref >60)
Glucose, Bld: 162 mg/dL — ABNORMAL HIGH (ref 65–99)
Potassium: 3.4 mmol/L — ABNORMAL LOW (ref 3.5–5.1)
SODIUM: 139 mmol/L (ref 135–145)
TOTAL PROTEIN: 7 g/dL (ref 6.5–8.1)
Total Bilirubin: 0.5 mg/dL (ref 0.3–1.2)

## 2017-09-17 MED ORDER — HEPARIN SOD (PORK) LOCK FLUSH 100 UNIT/ML IV SOLN
500.0000 [IU] | Freq: Once | INTRAVENOUS | Status: AC
  Administered 2017-09-17: 500 [IU] via INTRAVENOUS
  Filled 2017-09-17: qty 5

## 2017-09-17 MED ORDER — DEXAMETHASONE SODIUM PHOSPHATE 10 MG/ML IJ SOLN
10.0000 mg | Freq: Once | INTRAMUSCULAR | Status: AC
  Administered 2017-09-17: 10 mg via INTRAVENOUS
  Filled 2017-09-17: qty 1

## 2017-09-17 MED ORDER — SODIUM CHLORIDE 0.9 % IV SOLN
80.0000 mg/m2 | Freq: Once | INTRAVENOUS | Status: AC
  Administered 2017-09-17: 130 mg via INTRAVENOUS
  Filled 2017-09-17: qty 6.5

## 2017-09-17 MED ORDER — PALONOSETRON HCL INJECTION 0.25 MG/5ML
0.2500 mg | Freq: Once | INTRAVENOUS | Status: AC
  Administered 2017-09-17: 0.25 mg via INTRAVENOUS
  Filled 2017-09-17: qty 5

## 2017-09-17 MED ORDER — SODIUM CHLORIDE 0.9% FLUSH
10.0000 mL | Freq: Once | INTRAVENOUS | Status: AC
  Administered 2017-09-17: 10 mL via INTRAVENOUS
  Filled 2017-09-17: qty 10

## 2017-09-17 MED ORDER — SODIUM CHLORIDE 0.9 % IV SOLN
Freq: Once | INTRAVENOUS | Status: AC
  Administered 2017-09-17: 09:00:00 via INTRAVENOUS
  Filled 2017-09-17: qty 1000

## 2017-09-17 MED ORDER — SODIUM CHLORIDE 0.9 % IV SOLN
370.0000 mg | Freq: Once | INTRAVENOUS | Status: AC
  Administered 2017-09-17: 370 mg via INTRAVENOUS
  Filled 2017-09-17: qty 37

## 2017-09-17 NOTE — Progress Notes (Signed)
Per Dr Grayland Ormond, ok to proceed with treatment though labs are outside of parameters.

## 2017-09-17 NOTE — Progress Notes (Signed)
Plt =74.  Treatment plan parameters are for plt<75.  Per MD ok to treat patient today

## 2017-09-18 ENCOUNTER — Inpatient Hospital Stay: Payer: Medicare Other

## 2017-09-18 VITALS — BP 121/79 | HR 71 | Temp 97.5°F | Resp 20

## 2017-09-18 DIAGNOSIS — D61818 Other pancytopenia: Secondary | ICD-10-CM | POA: Diagnosis not present

## 2017-09-18 DIAGNOSIS — Z5111 Encounter for antineoplastic chemotherapy: Secondary | ICD-10-CM | POA: Diagnosis not present

## 2017-09-18 DIAGNOSIS — Z7689 Persons encountering health services in other specified circumstances: Secondary | ICD-10-CM | POA: Diagnosis not present

## 2017-09-18 DIAGNOSIS — C7A8 Other malignant neuroendocrine tumors: Secondary | ICD-10-CM | POA: Diagnosis not present

## 2017-09-18 DIAGNOSIS — J0110 Acute frontal sinusitis, unspecified: Secondary | ICD-10-CM | POA: Diagnosis not present

## 2017-09-18 DIAGNOSIS — C641 Malignant neoplasm of right kidney, except renal pelvis: Secondary | ICD-10-CM | POA: Diagnosis not present

## 2017-09-18 MED ORDER — DEXAMETHASONE SODIUM PHOSPHATE 10 MG/ML IJ SOLN
10.0000 mg | Freq: Once | INTRAMUSCULAR | Status: AC
  Administered 2017-09-18: 10 mg via INTRAVENOUS
  Filled 2017-09-18: qty 1

## 2017-09-18 MED ORDER — SODIUM CHLORIDE 0.9 % IV SOLN
Freq: Once | INTRAVENOUS | Status: AC
  Administered 2017-09-18: 09:00:00 via INTRAVENOUS
  Filled 2017-09-18: qty 1000

## 2017-09-18 MED ORDER — HEPARIN SOD (PORK) LOCK FLUSH 100 UNIT/ML IV SOLN
500.0000 [IU] | Freq: Once | INTRAVENOUS | Status: AC
Start: 2017-09-18 — End: 2017-09-18
  Administered 2017-09-18: 500 [IU] via INTRAVENOUS

## 2017-09-18 MED ORDER — SODIUM CHLORIDE 0.9 % IV SOLN
80.0000 mg/m2 | Freq: Once | INTRAVENOUS | Status: AC
  Administered 2017-09-18: 130 mg via INTRAVENOUS
  Filled 2017-09-18: qty 6.5

## 2017-09-18 MED ORDER — SODIUM CHLORIDE 0.9% FLUSH
10.0000 mL | INTRAVENOUS | Status: DC | PRN
  Administered 2017-09-18: 10 mL via INTRAVENOUS
  Filled 2017-09-18: qty 10

## 2017-09-19 ENCOUNTER — Inpatient Hospital Stay: Payer: Medicare Other

## 2017-09-19 VITALS — BP 150/80 | HR 78 | Temp 96.1°F | Resp 18

## 2017-09-19 DIAGNOSIS — J0110 Acute frontal sinusitis, unspecified: Secondary | ICD-10-CM | POA: Diagnosis not present

## 2017-09-19 DIAGNOSIS — C7A8 Other malignant neuroendocrine tumors: Secondary | ICD-10-CM | POA: Diagnosis not present

## 2017-09-19 DIAGNOSIS — Z7689 Persons encountering health services in other specified circumstances: Secondary | ICD-10-CM | POA: Diagnosis not present

## 2017-09-19 DIAGNOSIS — C641 Malignant neoplasm of right kidney, except renal pelvis: Secondary | ICD-10-CM | POA: Diagnosis not present

## 2017-09-19 DIAGNOSIS — Z5111 Encounter for antineoplastic chemotherapy: Secondary | ICD-10-CM | POA: Diagnosis not present

## 2017-09-19 DIAGNOSIS — D61818 Other pancytopenia: Secondary | ICD-10-CM | POA: Diagnosis not present

## 2017-09-19 MED ORDER — SODIUM CHLORIDE 0.9 % IV SOLN: 10.0000 mg | Freq: Once | INTRAVENOUS | Status: DC

## 2017-09-19 MED ORDER — HEPARIN SOD (PORK) LOCK FLUSH 100 UNIT/ML IV SOLN
500.0000 [IU] | Freq: Once | INTRAVENOUS | Status: AC | PRN
  Administered 2017-09-19: 500 [IU]
  Filled 2017-09-19: qty 5

## 2017-09-19 MED ORDER — DEXAMETHASONE SODIUM PHOSPHATE 10 MG/ML IJ SOLN
10.0000 mg | Freq: Once | INTRAMUSCULAR | Status: AC
  Administered 2017-09-19: 10 mg via INTRAVENOUS
  Filled 2017-09-19: qty 1

## 2017-09-19 MED ORDER — ETOPOSIDE CHEMO INJECTION 1 GM/50ML
80.0000 mg/m2 | Freq: Once | INTRAVENOUS | Status: AC
  Administered 2017-09-19: 130 mg via INTRAVENOUS
  Filled 2017-09-19: qty 6.5

## 2017-09-19 MED ORDER — PEGFILGRASTIM 6 MG/0.6ML ~~LOC~~ PSKT
6.0000 mg | PREFILLED_SYRINGE | Freq: Once | SUBCUTANEOUS | Status: AC
  Administered 2017-09-19: 6 mg via SUBCUTANEOUS
  Filled 2017-09-19: qty 0.6

## 2017-09-19 MED ORDER — SODIUM CHLORIDE 0.9 % IV SOLN
Freq: Once | INTRAVENOUS | Status: AC
  Administered 2017-09-19: 09:00:00 via INTRAVENOUS
  Filled 2017-09-19: qty 1000

## 2017-09-19 MED ORDER — SODIUM CHLORIDE 0.9% FLUSH
10.0000 mL | INTRAVENOUS | Status: DC | PRN
  Filled 2017-09-19: qty 10

## 2017-09-23 ENCOUNTER — Other Ambulatory Visit: Payer: Self-pay | Admitting: Internal Medicine

## 2017-09-23 NOTE — Telephone Encounter (Signed)
Refilled: 07/18/2017 Last OV: 07/18/2017 Next OV: 10/16/2017

## 2017-09-24 ENCOUNTER — Other Ambulatory Visit: Payer: Self-pay

## 2017-09-24 ENCOUNTER — Inpatient Hospital Stay: Payer: Medicare Other

## 2017-09-24 ENCOUNTER — Other Ambulatory Visit: Payer: Self-pay | Admitting: *Deleted

## 2017-09-24 ENCOUNTER — Encounter: Payer: Self-pay | Admitting: Radiation Oncology

## 2017-09-24 ENCOUNTER — Ambulatory Visit
Admission: RE | Admit: 2017-09-24 | Discharge: 2017-09-24 | Disposition: A | Discharge status: Home / Self Care | Payer: Medicare Other | Source: Ambulatory Visit | Attending: Radiation Oncology | Admitting: Radiation Oncology

## 2017-09-24 VITALS — BP 145/86 | HR 86 | Temp 97.1°F | Resp 20 | Wt 135.8 lb

## 2017-09-24 DIAGNOSIS — C7A8 Other malignant neuroendocrine tumors: Secondary | ICD-10-CM

## 2017-09-24 DIAGNOSIS — Z923 Personal history of irradiation: Secondary | ICD-10-CM | POA: Diagnosis not present

## 2017-09-24 DIAGNOSIS — J0110 Acute frontal sinusitis, unspecified: Secondary | ICD-10-CM | POA: Diagnosis not present

## 2017-09-24 DIAGNOSIS — C641 Malignant neoplasm of right kidney, except renal pelvis: Secondary | ICD-10-CM | POA: Diagnosis not present

## 2017-09-24 DIAGNOSIS — R05 Cough: Secondary | ICD-10-CM | POA: Diagnosis not present

## 2017-09-24 DIAGNOSIS — D61818 Other pancytopenia: Secondary | ICD-10-CM | POA: Diagnosis not present

## 2017-09-24 DIAGNOSIS — Z7689 Persons encountering health services in other specified circumstances: Secondary | ICD-10-CM | POA: Diagnosis not present

## 2017-09-24 DIAGNOSIS — Z5111 Encounter for antineoplastic chemotherapy: Secondary | ICD-10-CM | POA: Diagnosis not present

## 2017-09-24 LAB — CBC WITH DIFFERENTIAL/PLATELET
BASOS ABS: 0 10*3/uL (ref 0–0.1)
Basophils Relative: 1 %
EOS PCT: 1 %
Eosinophils Absolute: 0 10*3/uL (ref 0–0.7)
HCT: 24.9 % — ABNORMAL LOW (ref 35.0–47.0)
Hemoglobin: 8.2 g/dL — ABNORMAL LOW (ref 12.0–16.0)
LYMPHS ABS: 0.4 10*3/uL — AB (ref 1.0–3.6)
Lymphocytes Relative: 14 %
MCH: 29.6 pg (ref 26.0–34.0)
MCHC: 33 g/dL (ref 32.0–36.0)
MCV: 89.9 fL (ref 80.0–100.0)
MONO ABS: 0.9 10*3/uL (ref 0.2–0.9)
Monocytes Relative: 28 %
NEUTROS PCT: 56 %
Neutro Abs: 1.9 10*3/uL (ref 1.4–6.5)
PLATELETS: 44 10*3/uL — AB (ref 150–440)
RBC: 2.77 MIL/uL — AB (ref 3.80–5.20)
RDW: 19.4 % — AB (ref 11.5–14.5)
WBC: 3.2 10*3/uL — AB (ref 3.6–11.0)

## 2017-09-24 LAB — COMPREHENSIVE METABOLIC PANEL
ALT: 15 U/L (ref 14–54)
AST: 20 U/L (ref 15–41)
Albumin: 3.8 g/dL (ref 3.5–5.0)
Alkaline Phosphatase: 88 U/L (ref 38–126)
Anion gap: 5 (ref 5–15)
BILIRUBIN TOTAL: 0.6 mg/dL (ref 0.3–1.2)
BUN: 17 mg/dL (ref 6–20)
CHLORIDE: 106 mmol/L (ref 101–111)
CO2: 25 mmol/L (ref 22–32)
CREATININE: 0.78 mg/dL (ref 0.44–1.00)
Calcium: 9.2 mg/dL (ref 8.9–10.3)
Glucose, Bld: 98 mg/dL (ref 65–99)
POTASSIUM: 3.7 mmol/L (ref 3.5–5.1)
Sodium: 136 mmol/L (ref 135–145)
TOTAL PROTEIN: 6.7 g/dL (ref 6.5–8.1)

## 2017-09-24 NOTE — Progress Notes (Signed)
Radiation Oncology Follow up Note  Name: Caroline Nichols   Date:   09/24/2017 MRN:  [id number] DOB: Jun 10, 1941    This 77 y.o. female presents to the clinic today for one-month follow-up status post hypofractionated course of radiation therapy for stage IIB large cell neuroendocrine carcinoma of the left lung.  REFERRING PROVIDER: [name], MD  HPI: Patient is a 77 year old female now out 1 month having completed hypofractionated course of radiation therapy to her left lung for stage IIB large cell neuroendocrine carcinoma.. She continues on chemotherapy with carboplatinum and etoposide which is tolerating well. She is a slight clear cough no hemoptysis. No dysphagia. Appetite is good P when take also stable.   COMPLICATIONS OF TREATMENT: none  FOLLOW UP COMPLIANCE: keeps appointments   PHYSICAL EXAM:  BP (!) 145/86   Pulse 86   Temp (!) 97.1 F (36.2 C)   Resp 20   Wt 135 lb 12.9 oz (61.6 kg)   BMI 24.84 kg/m  Well-developed well-nourished patient in NAD. HEENT reveals PERLA, EOMI, discs not visualized.  Oral cavity is clear. No oral mucosal lesions are identified. Neck is clear without evidence of cervical or supraclavicular adenopathy. Lungs are clear to A&P. Cardiac examination is essentially unremarkable with regular rate and rhythm without murmur rub or thrill. Abdomen is benign with no organomegaly or masses noted. Motor sensory and DTR levels are equal and symmetric in the upper and lower extremities. Cranial nerves II through XII are grossly intact. Proprioception is intact. No peripheral adenopathy or edema is identified. No motor or sensory levels are noted. Crude visual fields are within normal range.  RADIOLOGY RESULTS: No current films for review  PLAN: Present time patient is doing well. She continues on chemotherapy. I've asked to see her back in 3 months for follow-up of ordered a CT scan of the chest with contrast prior to that visit. Patient is to call sooner with  any concerns.  I would like to take this opportunity to thank you for allowing me to participate in the care of your patient.Noreene Filbert, MD

## 2017-09-25 NOTE — Telephone Encounter (Signed)
Printed, signed and faxed.  

## 2017-09-29 ENCOUNTER — Inpatient Hospital Stay: Payer: Medicare Other

## 2017-09-30 DIAGNOSIS — I10 Essential (primary) hypertension: Secondary | ICD-10-CM | POA: Diagnosis not present

## 2017-09-30 DIAGNOSIS — H1850 Unspecified hereditary corneal dystrophies: Secondary | ICD-10-CM | POA: Diagnosis not present

## 2017-09-30 DIAGNOSIS — H47233 Glaucomatous optic atrophy, bilateral: Secondary | ICD-10-CM | POA: Diagnosis not present

## 2017-09-30 DIAGNOSIS — Z79899 Other long term (current) drug therapy: Secondary | ICD-10-CM | POA: Diagnosis not present

## 2017-09-30 DIAGNOSIS — H25013 Cortical age-related cataract, bilateral: Secondary | ICD-10-CM | POA: Diagnosis not present

## 2017-09-30 DIAGNOSIS — H401132 Primary open-angle glaucoma, bilateral, moderate stage: Secondary | ICD-10-CM | POA: Diagnosis not present

## 2017-09-30 DIAGNOSIS — H524 Presbyopia: Secondary | ICD-10-CM | POA: Diagnosis not present

## 2017-09-30 DIAGNOSIS — H5203 Hypermetropia, bilateral: Secondary | ICD-10-CM | POA: Diagnosis not present

## 2017-09-30 DIAGNOSIS — H52223 Regular astigmatism, bilateral: Secondary | ICD-10-CM | POA: Diagnosis not present

## 2017-09-30 DIAGNOSIS — M069 Rheumatoid arthritis, unspecified: Secondary | ICD-10-CM | POA: Diagnosis not present

## 2017-10-02 LAB — CUP PACEART REMOTE DEVICE CHECK
Brady Statistic AP VP Percent: 0.02 %
Brady Statistic AS VP Percent: 0.05 %
Brady Statistic RA Percent Paced: 11.29 %
Implantable Lead Implant Date: 20171124
Implantable Lead Location: 753859
Implantable Lead Location: 753860
Implantable Lead Model: 5076
Implantable Lead Model: 5076
Lead Channel Impedance Value: 323 Ohm
Lead Channel Impedance Value: 399 Ohm
Lead Channel Pacing Threshold Pulse Width: 0.4 ms
Lead Channel Pacing Threshold Pulse Width: 0.4 ms
Lead Channel Sensing Intrinsic Amplitude: 12.125 mV
Lead Channel Setting Pacing Amplitude: 2 V
Lead Channel Setting Pacing Pulse Width: 0.4 ms
MDC IDC LEAD IMPLANT DT: 20171124
MDC IDC MSMT BATTERY REMAINING LONGEVITY: 116 mo
MDC IDC MSMT BATTERY VOLTAGE: 3.03 V
MDC IDC MSMT LEADCHNL RA IMPEDANCE VALUE: 380 Ohm
MDC IDC MSMT LEADCHNL RA PACING THRESHOLD AMPLITUDE: 0.75 V
MDC IDC MSMT LEADCHNL RA SENSING INTR AMPL: 1.75 mV
MDC IDC MSMT LEADCHNL RA SENSING INTR AMPL: 1.75 mV
MDC IDC MSMT LEADCHNL RV IMPEDANCE VALUE: 475 Ohm
MDC IDC MSMT LEADCHNL RV PACING THRESHOLD AMPLITUDE: 0.875 V
MDC IDC MSMT LEADCHNL RV SENSING INTR AMPL: 12.125 mV
MDC IDC PG IMPLANT DT: 20171124
MDC IDC SESS DTM: 20190207192000
MDC IDC SET LEADCHNL RV PACING AMPLITUDE: 2.5 V
MDC IDC SET LEADCHNL RV SENSING SENSITIVITY: 0.9 mV
MDC IDC STAT BRADY AP VS PERCENT: 11.28 %
MDC IDC STAT BRADY AS VS PERCENT: 88.65 %
MDC IDC STAT BRADY RV PERCENT PACED: 0.07 %

## 2017-10-04 NOTE — Progress Notes (Signed)
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  HISTORY OF PRESENT ILLNESS:  Patient returns to clinic today for further evaluation and consideration of cycle 4, day 1 of carboplatinum and etoposide.  She has had occasional nosebleeds that resolved spontaneously, but otherwise has felt well.  She no longer complains of shortness of breath or cough.  She does not complain of weakness or fatigue today.  She does not complain of pain today. She has no neurologic complaints. She has a good appetite and denies weight loss.  She denies any chest pain or hemoptysis.  She denies any nausea, vomiting, constipation, or diarrhea.  She has no urinary complaints. Patient offers no further specific complaints today.   REVIEW OF SYSTEMS:    Review of Systems  Constitutional: Positive for malaise/fatigue. Negative for fever and weight loss.  HENT: Negative.   Eyes: Negative.  Negative for blurred vision, double vision and pain.  Respiratory: Negative.  Negative for cough and shortness of breath.   Cardiovascular: Negative.  Negative for chest pain and leg swelling.  Gastrointestinal: Negative.  Negative for abdominal pain, diarrhea and nausea.  Genitourinary: Negative.   Musculoskeletal: Negative.  Negative for falls.  Skin: Negative.  Negative for rash.  Neurological: Positive for weakness. Negative for sensory change.  Psychiatric/Behavioral: Negative.  The patient is not nervous/anxious.   All other systems reviewed and are negative.   PAST MEDICAL HISTORY: Past Medical History:  Diagnosis  Date   Anemia 02/2013   F/U with Dr. Grayland Ormond for Feraheme injections   Anxiety    Arthritis    Possible RA - Follow up appt with Dr. Jefm Bryant   Cellulitis of arm, right    Diabetes insipidus ([place])    On desmopressin   GERD (gastroesophageal reflux disease)    H/O seasonal allergies    H/O transfusion of packed red blood cells 02/2013   3 units PRBC for severe anemia 02/2013   Herniated disc    History of kidney stones    Hyperlipidemia    Hypertension    Hyperthyroidism    s/p radiation therapy, now hypothyroidism   IBS (irritable bowel syndrome)    Migraine    Pacemaker    a. s/p successful implantation of a Medtronic CapSureFix Novus MRI SureScan serial # [id number] on 06/28/16 by Dr. Caryl Comes for sinus node dysfunction.   Pancreatic cyst    Paroxysmal A-fib [facility])    Renal cell cancer, right ([place]) 10/23/2015   Right partial nephrectomy.    PAST SURGICAL HISTORY: Past Surgical History:  Procedure Laterality Date   ABDOMINAL HYSTERECTOMY     BREAST EXCISIONAL BIOPSY Right 2005   neg   BREAST SURGERY  1990s   Biopsy   CERVICAL SPINE SURGERY     Bone fusion   COLONOSCOPY  2008?   [place]   COLONOSCOPY WITH PROPOFOL N/A 10/15/2016   Procedure: COLONOSCOPY WITH PROPOFOL;  Surgeon: Robert Bellow, MD;  Location: [facility] ENDOSCOPY;  Service: Endoscopy;  Laterality: N/A;   EP IMPLANTABLE DEVICE N/A 06/28/2016   Procedure: Pacemaker Implant;  Surgeon: Deboraha Sprang, MD;  Location: [facility]  INVASIVE CV LAB;  Service: Cardiovascular;  Laterality: N/A;   HAMMER TOE SURGERY     HERNIA REPAIR Left    Inguinal Hernia Repair   PORTA CATH INSERTION N/A 07/24/2017   Procedure: PORTA CATH INSERTION;  Surgeon: Algernon Huxley, MD;  Location: [place] CV LAB;  Service: Cardiovascular;  Laterality: N/A;   ROBOTIC ASSITED PARTIAL NEPHRECTOMY Right 10/23/2015   Procedure: ROBOTIC ASSITED PARTIAL NEPHRECTOMY with intraop ultrasound;  Surgeon: Hollice Espy, MD;   Location: [facility] ORS;  Service: Urology;  Laterality: Right;   TUBAL LIGATION      FAMILY HISTORY Family History  Problem Relation Age of Onset   Heart disease Mother    Heart disease Father    Diabetes Brother    Kidney disease Brother    Stroke Daughter        due to medication reaction   Breast cancer Cousin    Prostate cancer Neg Hx    Hematuria Neg Hx     ADVANCED DIRECTIVES:  No flowsheet data found.  HEALTH MAINTENANCE: Social History   Tobacco Use   Smoking status: Former Smoker    Packs/day: 0.50    Years: 30.00    Pack years: 15.00    Types: Cigarettes    Last attempt to quit: 12/03/2012    Years since quitting: 4.8   Smokeless tobacco: Never Used  Substance Use Topics   Alcohol use: No    Alcohol/week: 0.0 oz   Drug use: No     Allergies  Allergen Reactions   Metrizamide     Other reaction(s): Other (See Comments)   Adhesive [Tape] Other (See Comments)    "irritate skin"   Augmentin [Amoxicillin-Pot Clavulanate] Diarrhea    Severe diarrhea   Codeine     REACTION: NAUSEA   Morphine And Related Other (See Comments)    Shut down her organs   Other     Skin irritation   Tetanus Toxoid     REACTION: ARM SWELLING,REDNESS   Tramadol Nausea And Vomiting    Current Outpatient Medications  Medication Sig Dispense Refill   ALPRAZolam (XANAX) 0.25 MG tablet TAKE ONE TABLET BY MOUTH TWICE A DAY AS NEEDED FOR ANXIETY 60 tablet 1   amLODipine (NORVASC) 10 MG tablet Take by mouth.     brimonidine (ALPHAGAN) 0.2 % ophthalmic solution Place 1 drop into both eyes 2 (two) times daily.     Calcium-Vitamin D (SUPER CALCIUM/D) 600-125 MG-UNIT TABS Take 2 tablets by mouth daily.      carvedilol (COREG) 6.25 MG tablet Take 1 tablet (6.25 mg total) 2 (two) times daily by mouth. 180 tablet 3   Cholecalciferol (D3-1000 PO) Take 1,000 Units by mouth daily. Reported on 07/28/2015     dorzolamide (TRUSOPT) 2 % ophthalmic solution      ferrous  sulfate 325 (65 FE) MG tablet Take 325 mg by mouth daily with breakfast.     folic acid (FOLVITE) 1 MG tablet Take by mouth.     gabapentin (NEURONTIN) 300 MG capsule TAKE ONE CAPSULE BY MOUTH THREE TIMES A DAY AS NEEDED SHINGLES PAIN 90 capsule 1   hydrALAZINE (APRESOLINE) 10 MG tablet Take by mouth.     hydroxychloroquine (PLAQUENIL) 200 MG tablet Take 400 mg by mouth daily.      latanoprost (XALATAN) 0.005 % ophthalmic solution Place 1 drop into both eyes at bedtime.      levothyroxine (SYNTHROID, LEVOTHROID) 112 MCG tablet TAKE ONE TABLET BY MOUTH EVERY MORNING BEFORE BREAKFAST  90 tablet 0   lidocaine-prilocaine (EMLA) cream Apply to affected area once 30 g 3   losartan (COZAAR) 100 MG tablet TAKE ONE TABLET BY MOUTH DAILY. NOTE DOSE CHANGED TO 100MG!! 90 tablet 1   mirabegron ER (MYRBETRIQ) 50 MG TB24 tablet Take 1 tablet (50 mg total) by mouth daily. 30 tablet 2   mirtazapine (REMERON) 15 MG tablet Take 1 tablet (15 mg total) by mouth at bedtime. 30 tablet 2   pantoprazole (PROTONIX) 40 MG tablet TAKE ONE TABLET BY MOUTH DAILY 90 tablet 1   potassium chloride SA (K-DUR,KLOR-CON) 20 MEQ tablet Take 1 tablet (20 mEq total) by mouth daily. 30 tablet 3   timolol (TIMOPTIC) 0.5 % ophthalmic solution      vitamin C (ASCORBIC ACID) 500 MG tablet Take 500 mg by mouth daily.     acetaminophen (TYLENOL) 325 MG tablet Take 2 tablets (650 mg total) by mouth every 6 (six) hours as needed for pain. (Patient not taking: Reported on 10/08/2017)     HYDROcodone-acetaminophen (NORCO/VICODIN) 5-325 MG tablet      nitroGLYCERIN (NITROLINGUAL) 0.4 MG/SPRAY spray Place 1 spray under the tongue every 5 (five) minutes x 3 doses as needed for chest pain. (Patient not taking: Reported on 10/08/2017) 4.9 g 0   Omega-3 Fatty Acids (FISH OIL) 1000 MG CAPS Take 1,000 mg by mouth daily.      ondansetron (ZOFRAN) 8 MG tablet Take 1 tablet (8 mg total) by mouth 2 (two) times daily as needed for refractory  nausea / vomiting. (Patient not taking: Reported on 10/08/2017) 30 tablet 2   prochlorperazine (COMPAZINE) 10 MG tablet Take 1 tablet (10 mg total) by mouth every 6 (six) hours as needed (Nausea or vomiting). (Patient not taking: Reported on 10/08/2017) 60 tablet 2   No current facility-administered medications for this visit.     OBJECTIVE:  Vitals:   10/08/17 0851  BP: 118/69  Pulse: 71  Resp: 18  Temp: (!) 97.4 F (36.3 C)     Body mass index is 24.64 kg/m.    ECOG FS:0 - Asymptomatic  Physical Exam   General: Well-developed, well-nourished, no acute distress. Eyes: Pink conjunctiva, anicteric sclera. HEENT: Swelling and ecchymosis surrounding both eyes, resolved. Lungs: Clear to auscultation bilaterally. Heart: Regular rate and rhythm. No rubs, murmurs, or gallops. Abdomen: Soft, nontender, nondistended. No organomegaly noted, normoactive bowel sounds. Musculoskeletal: No edema, cyanosis, or clubbing.  Right arm in splint secondary to fracture. Neuro: Alert, answering all questions appropriately. Cranial nerves grossly intact. Skin: No rashes or petechiae noted. Psych: Normal affect.   LAB RESULTS:  Appointment on 10/08/2017  Component Date Value Ref Range Status   Sodium 10/08/2017 140  135 - 145 mmol/L Final   Potassium 10/08/2017 3.6  3.5 - 5.1 mmol/L Final   Chloride 10/08/2017 109  101 - 111 mmol/L Final   CO2 10/08/2017 24  22 - 32 mmol/L Final   Glucose, Bld 10/08/2017 113* 65 - 99 mg/dL Final   BUN 10/08/2017 15  6 - 20 mg/dL Final   Creatinine, Ser 10/08/2017 0.66  0.44 - 1.00 mg/dL Final   Calcium 10/08/2017 9.0  8.9 - 10.3 mg/dL Final   Total Protein 10/08/2017 7.1  6.5 - 8.1 g/dL Final   Albumin 10/08/2017 3.8  3.5 - 5.0 g/dL Final   AST 10/08/2017 24  15 - 41 U/L Final   ALT 10/08/2017 13* 14 - 54 U/L Final   Alkaline Phosphatase 10/08/2017 88  38 - 126 U/L  Final   Total Bilirubin 10/08/2017 0.6  0.3 - 1.2 mg/dL Final   GFR calc non Af  Amer 10/08/2017 >60  >60 mL/min Final   GFR calc Af Amer 10/08/2017 >60  >60 mL/min Final   Comment: (NOTE) The eGFR has been calculated using the CKD EPI equation. This calculation has not been validated in all clinical situations. eGFR's persistently <60 mL/min signify possible Chronic Kidney Disease.    Anion gap 10/08/2017 7  5 - 15 Final   Performed at [facility], [place], [place] [address]   WBC 10/08/2017 1.9* 3.6 - 11.0 K/uL Final   RBC 10/08/2017 2.67* 3.80 - 5.20 MIL/uL Final   Hemoglobin 10/08/2017 8.1* 12.0 - 16.0 g/dL Final   HCT 10/08/2017 24.3* 35.0 - 47.0 % Final   MCV 10/08/2017 90.9  80.0 - 100.0 fL Final   MCH 10/08/2017 30.5  26.0 - 34.0 pg Final   MCHC 10/08/2017 33.5  32.0 - 36.0 g/dL Final   RDW 10/08/2017 21.7* 11.5 - 14.5 % Final   Platelets 10/08/2017 52* 150 - 440 K/uL Final   Neutrophils Relative % 10/08/2017 51  % Final   Neutro Abs 10/08/2017 1.0* 1.4 - 6.5 K/uL Final   Lymphocytes Relative 10/08/2017 23  % Final   Lymphs Abs 10/08/2017 0.4* 1.0 - 3.6 K/uL Final   Monocytes Relative 10/08/2017 25  % Final   Monocytes Absolute 10/08/2017 0.5  0.2 - 0.9 K/uL Final   Eosinophils Relative 10/08/2017 1  % Final   Eosinophils Absolute 10/08/2017 0.0  0 - 0.7 K/uL Final   Basophils Relative 10/08/2017 0  % Final   Basophils Absolute 10/08/2017 0.0  0 - 0.1 K/uL Final   Performed at [facility], [address] [place], [place] [address]       STUDIES: No results found.  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  Plan:  1. 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  Patient will benefit from concurrent chemotherapy and XRT.  Recent CT scan of her head after her fall did not reveal metastatic disease.  Patient will receive carboplatinum and etoposide on day 1 and then etoposide  only on days 2 and 3 with Neulasta support.  She has now completed her daily XRT.  She will receive carboplatinum instead of cisplatin given her history of a partial nephrectomy.  Delay cycle 4, day 1 of carboplatinum and etoposide today secondary to thrombocytopenia and leukopenia.  Return to clinic in 1 week for reconsideration of cycle 4. 2.  Pancytopenia: Previously, bone marrow biopsy revealed no evidence of MDS or other abnormality.  It is possible it is related to her Plaquenil or rheumatoid arthritis. The remainder of her laboratory work was either negative or within normal limits. No intervention is needed at this time.  Delay treatment as above.  Continue Neulasta with treatments.   3. Renal cell carcinoma of the right kidney: Status post partial nephrectomy. Continue follow-up with urology as indicated. 4.  Rheumatoid arthritis: Continue Plaquenil. Continue follow-up per rheumatology.  5.  Fracture right forearm: Resolved.  Continue follow-up with orthopedics as indicated. 6.  Thrombocytopenia: Delay treatment as above.  Patient expressed understanding and was in agreement with this plan. She also understands that She can call clinic at any time with any questions, concerns, or complaints.    Lloyd Huger, MD   10/08/2017 9:57 AM

## 2017-10-08 ENCOUNTER — Inpatient Hospital Stay: Payer: Medicare Other | Attending: Oncology

## 2017-10-08 ENCOUNTER — Other Ambulatory Visit: Payer: Self-pay

## 2017-10-08 ENCOUNTER — Inpatient Hospital Stay: Payer: Medicare Other

## 2017-10-08 ENCOUNTER — Inpatient Hospital Stay (HOSPITAL_BASED_OUTPATIENT_CLINIC_OR_DEPARTMENT_OTHER): Payer: Medicare Other | Admitting: Oncology

## 2017-10-08 VITALS — BP 118/69 | HR 71 | Temp 97.4°F | Resp 18 | Wt 134.7 lb

## 2017-10-08 DIAGNOSIS — E0590 Thyrotoxicosis, unspecified without thyrotoxic crisis or storm: Secondary | ICD-10-CM | POA: Insufficient documentation

## 2017-10-08 DIAGNOSIS — E232 Diabetes insipidus: Secondary | ICD-10-CM

## 2017-10-08 DIAGNOSIS — Z87442 Personal history of urinary calculi: Secondary | ICD-10-CM

## 2017-10-08 DIAGNOSIS — E039 Hypothyroidism, unspecified: Secondary | ICD-10-CM | POA: Diagnosis not present

## 2017-10-08 DIAGNOSIS — M069 Rheumatoid arthritis, unspecified: Secondary | ICD-10-CM | POA: Diagnosis not present

## 2017-10-08 DIAGNOSIS — I10 Essential (primary) hypertension: Secondary | ICD-10-CM | POA: Insufficient documentation

## 2017-10-08 DIAGNOSIS — C641 Malignant neoplasm of right kidney, except renal pelvis: Secondary | ICD-10-CM | POA: Diagnosis not present

## 2017-10-08 DIAGNOSIS — Z79899 Other long term (current) drug therapy: Secondary | ICD-10-CM | POA: Insufficient documentation

## 2017-10-08 DIAGNOSIS — C7A8 Other malignant neuroendocrine tumors: Secondary | ICD-10-CM

## 2017-10-08 DIAGNOSIS — Z87891 Personal history of nicotine dependence: Secondary | ICD-10-CM | POA: Insufficient documentation

## 2017-10-08 DIAGNOSIS — Z803 Family history of malignant neoplasm of breast: Secondary | ICD-10-CM

## 2017-10-08 DIAGNOSIS — K219 Gastro-esophageal reflux disease without esophagitis: Secondary | ICD-10-CM | POA: Insufficient documentation

## 2017-10-08 DIAGNOSIS — R040 Epistaxis: Secondary | ICD-10-CM | POA: Insufficient documentation

## 2017-10-08 DIAGNOSIS — D61818 Other pancytopenia: Secondary | ICD-10-CM | POA: Diagnosis not present

## 2017-10-08 DIAGNOSIS — R5381 Other malaise: Secondary | ICD-10-CM | POA: Diagnosis not present

## 2017-10-08 DIAGNOSIS — R5383 Other fatigue: Secondary | ICD-10-CM | POA: Insufficient documentation

## 2017-10-08 DIAGNOSIS — I480 Paroxysmal atrial fibrillation: Secondary | ICD-10-CM | POA: Diagnosis not present

## 2017-10-08 DIAGNOSIS — R531 Weakness: Secondary | ICD-10-CM | POA: Insufficient documentation

## 2017-10-08 DIAGNOSIS — E785 Hyperlipidemia, unspecified: Secondary | ICD-10-CM

## 2017-10-08 DIAGNOSIS — F419 Anxiety disorder, unspecified: Secondary | ICD-10-CM | POA: Insufficient documentation

## 2017-10-08 LAB — COMPREHENSIVE METABOLIC PANEL
ALK PHOS: 88 U/L (ref 38–126)
ALT: 13 U/L — ABNORMAL LOW (ref 14–54)
ANION GAP: 7 (ref 5–15)
AST: 24 U/L (ref 15–41)
Albumin: 3.8 g/dL (ref 3.5–5.0)
BUN: 15 mg/dL (ref 6–20)
CALCIUM: 9 mg/dL (ref 8.9–10.3)
CO2: 24 mmol/L (ref 22–32)
Chloride: 109 mmol/L (ref 101–111)
Creatinine, Ser: 0.66 mg/dL (ref 0.44–1.00)
GFR calc non Af Amer: >60 mL/min (ref >60)
GLUCOSE: 113 mg/dL — AB (ref 65–99)
Potassium: 3.6 mmol/L (ref 3.5–5.1)
Sodium: 140 mmol/L (ref 135–145)
Total Bilirubin: 0.6 mg/dL (ref 0.3–1.2)
Total Protein: 7.1 g/dL (ref 6.5–8.1)

## 2017-10-08 LAB — CBC WITH DIFFERENTIAL/PLATELET
BASOS ABS: 0 10*3/uL (ref 0–0.1)
BASOS PCT: 0 %
EOS ABS: 0 10*3/uL (ref 0–0.7)
Eosinophils Relative: 1 %
HEMATOCRIT: 24.3 % — AB (ref 35.0–47.0)
Hemoglobin: 8.1 g/dL — ABNORMAL LOW (ref 12.0–16.0)
Lymphocytes Relative: 23 %
Lymphs Abs: 0.4 10*3/uL — ABNORMAL LOW (ref 1.0–3.6)
MCH: 30.5 pg (ref 26.0–34.0)
MCHC: 33.5 g/dL (ref 32.0–36.0)
MCV: 90.9 fL (ref 80.0–100.0)
MONO ABS: 0.5 10*3/uL (ref 0.2–0.9)
Monocytes Relative: 25 %
NEUTROS ABS: 1 10*3/uL — AB (ref 1.4–6.5)
Neutrophils Relative %: 51 %
PLATELETS: 52 10*3/uL — AB (ref 150–440)
RBC: 2.67 MIL/uL — ABNORMAL LOW (ref 3.80–5.20)
RDW: 21.7 % — AB (ref 11.5–14.5)
WBC: 1.9 10*3/uL — ABNORMAL LOW (ref 3.6–11.0)

## 2017-10-08 MED ORDER — HEPARIN SOD (PORK) LOCK FLUSH 100 UNIT/ML IV SOLN
500.0000 [IU] | Freq: Once | INTRAVENOUS | Status: AC
  Administered 2017-10-08: 500 [IU] via INTRAVENOUS

## 2017-10-08 NOTE — Progress Notes (Signed)
Here for follow up. Doing well overall. Occasional nose bleeds-short lived per pt. Dry nose per pt.

## 2017-10-09 ENCOUNTER — Ambulatory Visit: Payer: Medicare Other

## 2017-10-10 ENCOUNTER — Ambulatory Visit: Payer: Medicare Other

## 2017-10-12 NOTE — Progress Notes (Signed)
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  HISTORY OF PRESENT ILLNESS:  Patient returns to clinic today for further evaluation and reconsideration of cycle 4, day 1 of carboplatinum and etoposide.  She currently feels well and is asymptomatic.  She does not report any further nosebleeds.  She no longer complains of shortness of breath or cough.  She does not complain of weakness or fatigue today.  She does not complain of pain today. She has no neurologic complaints. She has a good appetite and denies weight loss.  She denies any chest pain or hemoptysis.  She denies any nausea, vomiting, constipation, or diarrhea.  She has no urinary complaints. Patient offers no specific complaints today.   REVIEW OF SYSTEMS:    Review of Systems  Constitutional: Negative.  Negative for fever, malaise/fatigue and weight loss.  HENT: Negative.   Eyes: Negative.  Negative for blurred vision, double vision and pain.  Respiratory: Negative.  Negative for cough and shortness of breath.   Cardiovascular: Negative.  Negative for chest pain and leg swelling.  Gastrointestinal: Negative.  Negative for abdominal pain, diarrhea and nausea.  Genitourinary: Negative.   Musculoskeletal: Negative.  Negative for falls.  Skin: Negative.  Negative for rash.  Neurological: Negative.  Negative for sensory change and weakness.  Psychiatric/Behavioral: Negative.  The patient is not nervous/anxious.   All other systems reviewed and are negative.   PAST MEDICAL HISTORY: Past Medical History:  Diagnosis Date    Anemia 02/2013   F/U with Dr. Grayland Ormond for Feraheme injections   Anxiety    Arthritis    Possible RA - Follow up appt with Dr. Jefm Bryant   Cellulitis of arm, right    Diabetes insipidus ([place])    On desmopressin   GERD (gastroesophageal reflux disease)    H/O seasonal allergies    H/O transfusion of packed red blood cells 02/2013   3 units PRBC for severe anemia 02/2013   Herniated disc    History of kidney stones    Hyperlipidemia    Hypertension    Hyperthyroidism    s/p radiation therapy, now hypothyroidism   IBS (irritable bowel syndrome)    Migraine    Pacemaker    a. s/p successful implantation of a Medtronic CapSureFix Novus MRI SureScan serial # [id number] on 06/28/16 by Dr. Caryl Comes for sinus node dysfunction.   Pancreatic cyst    Paroxysmal A-fib [facility])    Renal cell cancer, right ([place]) 10/23/2015   Right partial nephrectomy.    PAST SURGICAL HISTORY: Past Surgical History:  Procedure Laterality Date   ABDOMINAL HYSTERECTOMY     BREAST EXCISIONAL BIOPSY Right 2005   neg   BREAST SURGERY  1990s   Biopsy   CERVICAL SPINE SURGERY     Bone fusion   COLONOSCOPY  2008?   [place]   COLONOSCOPY WITH PROPOFOL N/A 10/15/2016   Procedure: COLONOSCOPY WITH PROPOFOL;  Surgeon: Robert Bellow, MD;  Location: [facility] ENDOSCOPY;  Service: Endoscopy;  Laterality: N/A;   EP IMPLANTABLE DEVICE N/A 06/28/2016   Procedure: Pacemaker Implant;  Surgeon: Deboraha Sprang, MD;  Location: [place] CV LAB;  Service: Cardiovascular;  Laterality: N/A;   HAMMER TOE SURGERY     HERNIA REPAIR Left    Inguinal Hernia Repair   PORTA CATH INSERTION N/A 07/24/2017   Procedure: PORTA CATH INSERTION;  Surgeon: Algernon Huxley, MD;  Location: [place] CV LAB;  Service: Cardiovascular;  Laterality: N/A;   ROBOTIC ASSITED PARTIAL NEPHRECTOMY Right 10/23/2015   Procedure: ROBOTIC ASSITED PARTIAL NEPHRECTOMY with intraop ultrasound;  Surgeon: Hollice Espy, MD;   Location: [facility] ORS;  Service: Urology;  Laterality: Right;   TUBAL LIGATION      FAMILY HISTORY Family History  Problem Relation Age of Onset   Heart disease Mother    Heart disease Father    Diabetes Brother    Kidney disease Brother    Stroke Daughter        due to medication reaction   Breast cancer Cousin    Prostate cancer Neg Hx    Hematuria Neg Hx     ADVANCED DIRECTIVES:  No flowsheet data found.  HEALTH MAINTENANCE: Social History   Tobacco Use   Smoking status: Former Smoker    Packs/day: 0.50    Years: 30.00    Pack years: 15.00    Types: Cigarettes    Last attempt to quit: 12/03/2012    Years since quitting: 4.8   Smokeless tobacco: Never Used  Substance Use Topics   Alcohol use: No    Alcohol/week: 0.0 oz   Drug use: No     Allergies  Allergen Reactions   Metrizamide     Other reaction(s): Other (See Comments)   Adhesive [Tape] Other (See Comments)    "irritate skin"   Augmentin [Amoxicillin-Pot Clavulanate] Diarrhea    Severe diarrhea   Codeine     REACTION: NAUSEA   Morphine And Related Other (See Comments)    Shut down her organs   Other     Skin irritation   Tetanus Toxoid     REACTION: ARM SWELLING,REDNESS   Tramadol Nausea And Vomiting    Current Outpatient Medications  Medication Sig Dispense Refill   acetaminophen (TYLENOL) 325 MG tablet Take 2 tablets (650 mg total) by mouth every 6 (six) hours as needed for pain.     ALPRAZolam (XANAX) 0.25 MG tablet TAKE ONE TABLET BY MOUTH TWICE A DAY AS NEEDED FOR ANXIETY 60 tablet 1   amLODipine (NORVASC) 10 MG tablet Take by mouth.     brimonidine (ALPHAGAN) 0.2 % ophthalmic solution Place 1 drop into both eyes 2 (two) times daily.     Calcium-Vitamin D (SUPER CALCIUM/D) 600-125 MG-UNIT TABS Take 2 tablets by mouth daily.      carvedilol (COREG) 6.25 MG tablet Take 1 tablet (6.25 mg total) 2 (two) times daily by mouth. 180 tablet 3   Cholecalciferol (D3-1000 PO)  Take 1,000 Units by mouth daily. Reported on 07/28/2015     dorzolamide (TRUSOPT) 2 % ophthalmic solution      ferrous sulfate 325 (65 FE) MG tablet Take 325 mg by mouth daily with breakfast.     folic acid (FOLVITE) 1 MG tablet Take by mouth.     HYDROcodone-acetaminophen (NORCO/VICODIN) 5-325 MG tablet      hydroxychloroquine (PLAQUENIL) 200 MG tablet Take 400 mg by mouth daily.      latanoprost (XALATAN) 0.005 % ophthalmic solution Place 1 drop into both eyes at bedtime.      levothyroxine (SYNTHROID, LEVOTHROID) 112 MCG tablet TAKE ONE TABLET BY MOUTH EVERY MORNING  BEFORE BREAKFAST 90 tablet 0   lidocaine-prilocaine (EMLA) cream Apply to affected area once 30 g 3   losartan (COZAAR) 100 MG tablet TAKE ONE TABLET BY MOUTH DAILY. NOTE DOSE CHANGED TO 100MG!! 90 tablet 1   mirabegron ER (MYRBETRIQ) 50 MG TB24 tablet Take 1 tablet (50 mg total) by mouth daily. 30 tablet 2   mirtazapine (REMERON) 15 MG tablet Take 1 tablet (15 mg total) by mouth at bedtime. 30 tablet 2   Omega-3 Fatty Acids (FISH OIL) 1000 MG CAPS Take 1,000 mg by mouth daily.      ondansetron (ZOFRAN) 8 MG tablet Take 1 tablet (8 mg total) by mouth 2 (two) times daily as needed for refractory nausea / vomiting. 30 tablet 2   pantoprazole (PROTONIX) 40 MG tablet TAKE ONE TABLET BY MOUTH DAILY 90 tablet 1   potassium chloride SA (K-DUR,KLOR-CON) 20 MEQ tablet Take 1 tablet (20 mEq total) by mouth daily. 30 tablet 3   prochlorperazine (COMPAZINE) 10 MG tablet Take 1 tablet (10 mg total) by mouth every 6 (six) hours as needed (Nausea or vomiting). 60 tablet 2   timolol (TIMOPTIC) 0.5 % ophthalmic solution      vitamin C (ASCORBIC ACID) 500 MG tablet Take 500 mg by mouth daily.     gabapentin (NEURONTIN) 300 MG capsule TAKE ONE CAPSULE BY MOUTH THREE TIMES daily for neuropathy 90 capsule 5   nitroGLYCERIN (NITROLINGUAL) 0.4 MG/SPRAY spray Place 1 spray under the tongue every 5 (five) minutes x 3 doses as needed for  chest pain. 4.9 g 0   No current facility-administered medications for this visit.     OBJECTIVE:  Vitals:   10/15/17 0913 10/15/17 0918  BP:  97/66  Pulse:  76  Resp: 12   Temp:  98.4 F (36.9 C)     Body mass index is 24.42 kg/m.    ECOG FS:0 - Asymptomatic  Physical Exam   General: Well-developed, well-nourished, no acute distress. Eyes: Pink conjunctiva, anicteric sclera. HEENT: Swelling and ecchymosis surrounding both eyes, resolved. Lungs: Clear to auscultation bilaterally. Heart: Regular rate and rhythm. No rubs, murmurs, or gallops. Abdomen: Soft, nontender, nondistended. No organomegaly noted, normoactive bowel sounds. Musculoskeletal: No edema, cyanosis, or clubbing.  Right arm in splint secondary to fracture. Neuro: Alert, answering all questions appropriately. Cranial nerves grossly intact. Skin: No rashes or petechiae noted. Psych: Normal affect.   LAB RESULTS:  Appointment on 10/15/2017  Component Date Value Ref Range Status   Sodium 10/15/2017 140  135 - 145 mmol/L Final   Potassium 10/15/2017 3.8  3.5 - 5.1 mmol/L Final   Chloride 10/15/2017 107  101 - 111 mmol/L Final   CO2 10/15/2017 25  22 - 32 mmol/L Final   Glucose, Bld 10/15/2017 139* 65 - 99 mg/dL Final   BUN 10/15/2017 15  6 - 20 mg/dL Final   Creatinine, Ser 10/15/2017 0.76  0.44 - 1.00 mg/dL Final   Calcium 10/15/2017 9.1  8.9 - 10.3 mg/dL Final   Total Protein 10/15/2017 7.1  6.5 - 8.1 g/dL Final   Albumin 10/15/2017 3.8  3.5 - 5.0 g/dL Final   AST 10/15/2017 29  15 - 41 U/L Final   ALT 10/15/2017 14  14 - 54 U/L Final   Alkaline Phosphatase 10/15/2017 76  38 - 126 U/L Final   Total Bilirubin 10/15/2017 0.5  0.3 - 1.2 mg/dL Final   GFR calc non Af Amer 10/15/2017 >60  >60 mL/min Final   GFR calc Af  Amer 10/15/2017 >60  >60 mL/min Final   Comment: (NOTE) The eGFR has been calculated using the CKD EPI equation. This calculation has not been validated in all clinical  situations. eGFR's persistently <60 mL/min signify possible Chronic Kidney Disease.    Anion gap 10/15/2017 8  5 - 15 Final   Performed at [facility], [place], [place] [address]   WBC 10/15/2017 1.9* 3.6 - 11.0 K/uL Final   RBC 10/15/2017 2.75* 3.80 - 5.20 MIL/uL Final   Hemoglobin 10/15/2017 8.4* 12.0 - 16.0 g/dL Final   HCT 10/15/2017 25.2* 35.0 - 47.0 % Final   MCV 10/15/2017 91.6  80.0 - 100.0 fL Final   MCH 10/15/2017 30.6  26.0 - 34.0 pg Final   MCHC 10/15/2017 33.4  32.0 - 36.0 g/dL Final   RDW 10/15/2017 23.3* 11.5 - 14.5 % Final   Platelets 10/15/2017 55* 150 - 440 K/uL Final   Neutrophils Relative % 10/15/2017 25  % Final   Neutro Abs 10/15/2017 0.5* 1.4 - 6.5 K/uL Final   Lymphocytes Relative 10/15/2017 26  % Final   Lymphs Abs 10/15/2017 0.5* 1.0 - 3.6 K/uL Final   Monocytes Relative 10/15/2017 48  % Final   Monocytes Absolute 10/15/2017 0.9  0.2 - 0.9 K/uL Final   Eosinophils Relative 10/15/2017 1  % Final   Eosinophils Absolute 10/15/2017 0.0  0 - 0.7 K/uL Final   Basophils Relative 10/15/2017 0  % Final   Basophils Absolute 10/15/2017 0.0  0 - 0.1 K/uL Final   WBC Morphology 10/15/2017 CONSISTANT WITH NEULASTA ADMIN ON 09/20/2017    Final   Performed at [facility], [address], [place], [place] [address]       STUDIES: No results found.  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  Plan:  1. 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  Patient will benefit from concurrent chemotherapy and XRT.  Recent CT scan of her head after her fall did not reveal metastatic disease.  Patient will receive carboplatinum and etoposide on day 1 and then etoposide only on days 2 and 3 with Neulasta support.  She has now completed her daily XRT.  She will receive carboplatinum instead of cisplatin given  her history of a partial nephrectomy.  Delay cycle 4, day 1 of carboplatinum and etoposide today secondary to thrombocytopenia and  neutropenia.  Given the difficulty of treatment secondary to pancytopenia, will schedule a PET scan in the next 1-2 weeks to assess the need for cycle 4 and further treatment.   2.  Pancytopenia: Previously, bone marrow biopsy revealed no evidence of MDS or other abnormality.  It is possible it is related to her Plaquenil or rheumatoid arthritis. The remainder of her laboratory work was either negative or within normal limits. No intervention is needed at this time.  Delay treatment as above.  Continue Neulasta with treatments.   3. Renal cell carcinoma of the right kidney: Status post partial nephrectomy. Continue follow-up with urology as indicated. 4.  Rheumatoid arthritis: Continue Plaquenil. Continue follow-up per rheumatology.  5.  Fracture right forearm: Resolved.  Continue follow-up with orthopedics as indicated. 6.  Thrombocytopenia: Delay treatment as above.  Patient expressed understanding and was in agreement with this plan. She also understands that She can call clinic at any time with any questions, concerns, or complaints.    Lloyd Huger, MD  10/18/2017 8:03 AM

## 2017-10-15 ENCOUNTER — Encounter: Payer: Self-pay | Admitting: Oncology

## 2017-10-15 ENCOUNTER — Inpatient Hospital Stay: Payer: Medicare Other

## 2017-10-15 ENCOUNTER — Inpatient Hospital Stay (HOSPITAL_BASED_OUTPATIENT_CLINIC_OR_DEPARTMENT_OTHER): Payer: Medicare Other | Admitting: Oncology

## 2017-10-15 ENCOUNTER — Other Ambulatory Visit: Payer: Self-pay

## 2017-10-15 VITALS — BP 97/66 | HR 76 | Temp 98.4°F | Resp 12 | Ht 62.0 in | Wt 133.5 lb

## 2017-10-15 DIAGNOSIS — Z87442 Personal history of urinary calculi: Secondary | ICD-10-CM

## 2017-10-15 DIAGNOSIS — M069 Rheumatoid arthritis, unspecified: Secondary | ICD-10-CM | POA: Diagnosis not present

## 2017-10-15 DIAGNOSIS — E039 Hypothyroidism, unspecified: Secondary | ICD-10-CM | POA: Diagnosis not present

## 2017-10-15 DIAGNOSIS — C7A8 Other malignant neuroendocrine tumors: Secondary | ICD-10-CM

## 2017-10-15 DIAGNOSIS — Z87891 Personal history of nicotine dependence: Secondary | ICD-10-CM | POA: Diagnosis not present

## 2017-10-15 DIAGNOSIS — K219 Gastro-esophageal reflux disease without esophagitis: Secondary | ICD-10-CM

## 2017-10-15 DIAGNOSIS — D61818 Other pancytopenia: Secondary | ICD-10-CM

## 2017-10-15 DIAGNOSIS — I480 Paroxysmal atrial fibrillation: Secondary | ICD-10-CM

## 2017-10-15 DIAGNOSIS — E232 Diabetes insipidus: Secondary | ICD-10-CM

## 2017-10-15 DIAGNOSIS — R531 Weakness: Secondary | ICD-10-CM | POA: Diagnosis not present

## 2017-10-15 DIAGNOSIS — I10 Essential (primary) hypertension: Secondary | ICD-10-CM

## 2017-10-15 DIAGNOSIS — E785 Hyperlipidemia, unspecified: Secondary | ICD-10-CM

## 2017-10-15 DIAGNOSIS — Z79899 Other long term (current) drug therapy: Secondary | ICD-10-CM | POA: Diagnosis not present

## 2017-10-15 DIAGNOSIS — C641 Malignant neoplasm of right kidney, except renal pelvis: Secondary | ICD-10-CM

## 2017-10-15 DIAGNOSIS — Z803 Family history of malignant neoplasm of breast: Secondary | ICD-10-CM

## 2017-10-15 DIAGNOSIS — R5383 Other fatigue: Secondary | ICD-10-CM | POA: Diagnosis not present

## 2017-10-15 DIAGNOSIS — R5381 Other malaise: Secondary | ICD-10-CM | POA: Diagnosis not present

## 2017-10-15 LAB — CBC WITH DIFFERENTIAL/PLATELET
BASOS ABS: 0 10*3/uL (ref 0–0.1)
BASOS PCT: 0 %
EOS ABS: 0 10*3/uL (ref 0–0.7)
Eosinophils Relative: 1 %
HCT: 25.2 % — ABNORMAL LOW (ref 35.0–47.0)
Hemoglobin: 8.4 g/dL — ABNORMAL LOW (ref 12.0–16.0)
Lymphocytes Relative: 26 %
Lymphs Abs: 0.5 10*3/uL — ABNORMAL LOW (ref 1.0–3.6)
MCH: 30.6 pg (ref 26.0–34.0)
MCHC: 33.4 g/dL (ref 32.0–36.0)
MCV: 91.6 fL (ref 80.0–100.0)
MONOS PCT: 48 %
Monocytes Absolute: 0.9 10*3/uL (ref 0.2–0.9)
NEUTROS ABS: 0.5 10*3/uL — AB (ref 1.4–6.5)
NEUTROS PCT: 25 %
PLATELETS: 55 10*3/uL — AB (ref 150–440)
RBC: 2.75 MIL/uL — AB (ref 3.80–5.20)
RDW: 23.3 % — ABNORMAL HIGH (ref 11.5–14.5)
WBC: 1.9 10*3/uL — AB (ref 3.6–11.0)

## 2017-10-15 LAB — COMPREHENSIVE METABOLIC PANEL
ALK PHOS: 76 U/L (ref 38–126)
ALT: 14 U/L (ref 14–54)
ANION GAP: 8 (ref 5–15)
AST: 29 U/L (ref 15–41)
Albumin: 3.8 g/dL (ref 3.5–5.0)
BUN: 15 mg/dL (ref 6–20)
CALCIUM: 9.1 mg/dL (ref 8.9–10.3)
CO2: 25 mmol/L (ref 22–32)
CREATININE: 0.76 mg/dL (ref 0.44–1.00)
Chloride: 107 mmol/L (ref 101–111)
Glucose, Bld: 139 mg/dL — ABNORMAL HIGH (ref 65–99)
Potassium: 3.8 mmol/L (ref 3.5–5.1)
Sodium: 140 mmol/L (ref 135–145)
TOTAL PROTEIN: 7.1 g/dL (ref 6.5–8.1)
Total Bilirubin: 0.5 mg/dL (ref 0.3–1.2)

## 2017-10-15 NOTE — Progress Notes (Signed)
Patient here for follow up. She reports no changes since last appointment.

## 2017-10-16 ENCOUNTER — Ambulatory Visit (INDEPENDENT_AMBULATORY_CARE_PROVIDER_SITE_OTHER): Payer: Medicare Other | Admitting: Internal Medicine

## 2017-10-16 ENCOUNTER — Ambulatory Visit: Payer: Medicare Other

## 2017-10-16 ENCOUNTER — Encounter: Payer: Self-pay | Admitting: Internal Medicine

## 2017-10-16 VITALS — BP 100/58 | HR 77 | Temp 98.0°F | Resp 16 | Ht 62.0 in | Wt 135.6 lb

## 2017-10-16 DIAGNOSIS — E890 Postprocedural hypothyroidism: Secondary | ICD-10-CM

## 2017-10-16 DIAGNOSIS — E538 Deficiency of other specified B group vitamins: Secondary | ICD-10-CM

## 2017-10-16 DIAGNOSIS — E785 Hyperlipidemia, unspecified: Secondary | ICD-10-CM

## 2017-10-16 DIAGNOSIS — E039 Hypothyroidism, unspecified: Secondary | ICD-10-CM | POA: Diagnosis not present

## 2017-10-16 DIAGNOSIS — Z8679 Personal history of other diseases of the circulatory system: Secondary | ICD-10-CM | POA: Diagnosis not present

## 2017-10-16 DIAGNOSIS — D61818 Other pancytopenia: Secondary | ICD-10-CM

## 2017-10-16 DIAGNOSIS — F329 Major depressive disorder, single episode, unspecified: Secondary | ICD-10-CM | POA: Diagnosis not present

## 2017-10-16 DIAGNOSIS — I10 Essential (primary) hypertension: Secondary | ICD-10-CM | POA: Diagnosis not present

## 2017-10-16 DIAGNOSIS — Z789 Other specified health status: Secondary | ICD-10-CM | POA: Diagnosis not present

## 2017-10-16 MED ORDER — CYANOCOBALAMIN 1000 MCG/ML IJ SOLN
1000.0000 ug | Freq: Once | INTRAMUSCULAR | Status: AC
  Administered 2017-10-16: 1000 ug via INTRAMUSCULAR

## 2017-10-16 MED ORDER — GABAPENTIN 300 MG PO CAPS: ORAL_CAPSULE | ORAL | 5 refills | Status: DC

## 2017-10-16 MED ORDER — NITROGLYCERIN 0.4 MG/SPRAY TL SOLN: 1.0000 | 0 refills | Status: DC | PRN

## 2017-10-16 NOTE — Assessment & Plan Note (Addendum)
Managed with IM injections .  One was given today as seh has not had one in several months

## 2017-10-16 NOTE — Patient Instructions (Addendum)
Your BP is low,  Suspend the hydralazine but continue  Your  other BP medications  If your blood pressure is still < 120 after a few days,  Let me know     I want Dr Gary Fleet office to check your thyroid and lipid panel next week

## 2017-10-16 NOTE — Progress Notes (Signed)
Subjective:  Patient ID: Caroline Nichols, female    DOB: [birth year]  Age: 77 y.o. MRN: [id number]  CC: The primary encounter diagnosis was Hyperlipidemia LDL goal <100. Diagnoses of Statin intolerance, Acquired hypothyroidism, B12 deficiency, Reactive depression, Acquired pancytopenia ([place]), Essential hypertension, Postablative hypothyroidism, and History of atrial fibrillation were also pertinent to this visit.  HPI: Caroline Nichols presents for FOLLOW UP ON MULTIPLE ISSUES inlcuding  hypertension,  hyperlipidemia    Since her last visit she was diagnosed with lung CA by CT guided biopsy Dec 2018.  She  has finished XRT for  treatment for neuroendocrine  carcinoma of left lung and is entering chemotherapy with carboplatinum and etoposide.  She has begun to lose her hair ,  but feels generally well other than fatigue. and is in good spirits today .  Dr Grayland Ormond is also following her blood dyscrasias ( negative BM biopsy for pancytopenia)  She did not toelrate statin therapy due to muscle pain .  Her blood pressure has been running low   during her last few doctor visits and at home.  Reviewed her medications ; she is on multiple meds including hydralazine  (using it twice daily ) and amlodipine .  Home reading on Saturday was 109/76.  Has had some coughing spells that worsened after she finished XRT, and Dr Primus Bravo advised her that this was due to irritation from XRT. She is using tessalon perles for suppression .  She has been  better with 15 mg dose of remeron     Outpatient Medications Prior to Visit  Medication Sig Dispense Refill  . acetaminophen (TYLENOL) 325 MG tablet Take 2 tablets (650 mg total) by mouth every 6 (six) hours as needed for pain.    Marland Kitchen ALPRAZolam (XANAX) 0.25 MG tablet TAKE ONE TABLET BY MOUTH TWICE A DAY AS NEEDED FOR ANXIETY 60 tablet 1  . amLODipine (NORVASC) 10 MG tablet Take by mouth.    . brimonidine (ALPHAGAN) 0.2 % ophthalmic solution Place 1 drop into both eyes 2  (two) times daily.    . Calcium-Vitamin D (SUPER CALCIUM/D) 600-125 MG-UNIT TABS Take 2 tablets by mouth daily.     . carvedilol (COREG) 6.25 MG tablet Take 1 tablet (6.25 mg total) 2 (two) times daily by mouth. 180 tablet 3  . Cholecalciferol (D3-1000 PO) Take 1,000 Units by mouth daily. Reported on 07/28/2015    . dorzolamide (TRUSOPT) 2 % ophthalmic solution     . ferrous sulfate 325 (65 FE) MG tablet Take 325 mg by mouth daily with breakfast.    . folic acid (FOLVITE) 1 MG tablet Take by mouth.    Marland Kitchen HYDROcodone-acetaminophen (NORCO/VICODIN) 5-325 MG tablet     . hydroxychloroquine (PLAQUENIL) 200 MG tablet Take 400 mg by mouth daily.     Marland Kitchen latanoprost (XALATAN) 0.005 % ophthalmic solution Place 1 drop into both eyes at bedtime.     Marland Kitchen levothyroxine (SYNTHROID, LEVOTHROID) 112 MCG tablet TAKE ONE TABLET BY MOUTH EVERY MORNING BEFORE BREAKFAST 90 tablet 0  . lidocaine-prilocaine (EMLA) cream Apply to affected area once 30 g 3  . losartan (COZAAR) 100 MG tablet TAKE ONE TABLET BY MOUTH DAILY. NOTE DOSE CHANGED TO 100MG !! 90 tablet 1  . mirabegron ER (MYRBETRIQ) 50 MG TB24 tablet Take 1 tablet (50 mg total) by mouth daily. 30 tablet 2  . mirtazapine (REMERON) 15 MG tablet Take 1 tablet (15 mg total) by mouth at bedtime. 30 tablet 2  . Omega-3 Fatty Acids (  FISH OIL) 1000 MG CAPS Take 1,000 mg by mouth daily.     . ondansetron (ZOFRAN) 8 MG tablet Take 1 tablet (8 mg total) by mouth 2 (two) times daily as needed for refractory nausea / vomiting. 30 tablet 2  . pantoprazole (PROTONIX) 40 MG tablet TAKE ONE TABLET BY MOUTH DAILY 90 tablet 1  . potassium chloride SA (K-DUR,KLOR-CON) 20 MEQ tablet Take 1 tablet (20 mEq total) by mouth daily. 30 tablet 3  . prochlorperazine (COMPAZINE) 10 MG tablet Take 1 tablet (10 mg total) by mouth every 6 (six) hours as needed (Nausea or vomiting). 60 tablet 2  . timolol (TIMOPTIC) 0.5 % ophthalmic solution     . vitamin C (ASCORBIC ACID) 500 MG tablet Take 500 mg  by mouth daily.    Marland Kitchen gabapentin (NEURONTIN) 300 MG capsule TAKE ONE CAPSULE BY MOUTH THREE TIMES A DAY AS NEEDED SHINGLES PAIN 90 capsule 1  . hydrALAZINE (APRESOLINE) 10 MG tablet Take by mouth.    . nitroGLYCERIN (NITROLINGUAL) 0.4 MG/SPRAY spray Place 1 spray under the tongue every 5 (five) minutes x 3 doses as needed for chest pain. 4.9 g 0   No facility-administered medications prior to visit.     Review of Systems;  Patient denies headache, fevers, malaise, unintentional weight loss, skin rash, eye pain, sinus congestion and sinus pain, sore throat, dysphagia,  hemoptysis , cough, dyspnea, wheezing, chest pain, palpitations, orthopnea, edema, abdominal pain, nausea, melena, diarrhea, constipation, flank pain, dysuria, hematuria, urinary  Frequency, nocturia, numbness, tingling, seizures,  Focal weakness, Loss of consciousness,  Tremor, insomnia, depression, anxiety, and suicidal ideation.      Objective:  BP (!) 100/58 (BP Location: Left Arm, Patient Position: Sitting, Cuff Size: Normal)   Pulse 77   Temp 98 F (36.7 C) (Oral)   Resp 16   Ht 5\' 2"  (1.575 m)   Wt 135 lb 9.6 oz (61.5 kg)   SpO2 98%   BMI 24.80 kg/m   BP Readings from Last 3 Encounters:  10/16/17 (!) 100/58  10/15/17 97/66  10/08/17 118/69    Wt Readings from Last 3 Encounters:  10/16/17 135 lb 9.6 oz (61.5 kg)  10/15/17 133 lb 8 oz (60.6 kg)  10/08/17 134 lb 11.2 oz (61.1 kg)    General appearance: alert, cooperative and appears stated age Ears: normal TM's and external ear canals both ears Throat: lips, mucosa, and tongue normal; teeth and gums normal Neck: no adenopathy, no carotid bruit, supple, symmetrical, trachea midline and thyroid not enlarged, symmetric, no tenderness/mass/nodules Back: symmetric, no curvature. ROM normal. No CVA tenderness. Lungs: clear to auscultation bilaterally Heart: regular rate and rhythm, S1, S2 normal, no murmur, click, rub or gallop Abdomen: soft, non-tender; bowel  sounds normal; no masses,  no organomegaly Pulses: 2+ and symmetric Skin: Skin color, texture, turgor normal. No rashes or lesions Lymph nodes: Cervical, supraclavicular, and axillary nodes normal.  Lab Results  Component Value Date   HGBA1C 5.3 06/09/2014   HGBA1C 5.2 12/27/2012    Lab Results  Component Value Date   CREATININE 0.76 10/15/2017   CREATININE 0.66 10/08/2017   CREATININE 0.78 09/24/2017    Lab Results  Component Value Date   WBC 1.9 (L) 10/15/2017   HGB 8.4 (L) 10/15/2017   HCT 25.2 (L) 10/15/2017   PLT 55 (L) 10/15/2017   GLUCOSE 139 (H) 10/15/2017   CHOL 167 01/11/2015   TRIG 72.0 01/11/2015   HDL 54.50 01/11/2015   LDLCALC 98 01/11/2015  ALT 14 10/15/2017   AST 29 10/15/2017   NA 140 10/15/2017   K 3.8 10/15/2017   CL 107 10/15/2017   CREATININE 0.76 10/15/2017   BUN 15 10/15/2017   CO2 25 10/15/2017   TSH 1.15 01/16/2017   INR 1.14 07/08/2017   HGBA1C 5.3 06/09/2014   MICROALBUR 0.7 06/09/2014    No results found.  Assessment & Plan:   Problem List Items Addressed This Visit    Acquired pancytopenia ([place])    BM biopsy failed to show any signs of myelodisplasia       B12 deficiency    Managed with IM injections .  One was given today as seh has not had one in several months        Relevant Medications   cyanocobalamin ((VITAMIN B-12)) injection 1,000 mcg (Completed)   Depression    Aggravated by second diagnosis of cancer. Symptoms and outlook have Improved,  With better sleep since dose of remeron was increased to 15 mg   No changes today       Essential hypertension    Her blood pressure has been trending down and her medications need adjustment .  Suspending hydralazine for now.  Continue amlodipine and carvedilol       Relevant Medications   nitroGLYCERIN (NITROLINGUAL) 0.4 MG/SPRAY spray   History of atrial fibrillation    Occurred post operatively during period of anemia and respiratory failure.  Has been in sinus rhythm,   Paced  Since 2017 when she received Medtronic PM for symptomatic bradycardia.       Postablative hypothyroidism    Last assessment of Thyroid function  Was in June 2018  And was  WNL . Will repeat next week  Lab Results  Component Value Date   TSH 1.15 01/16/2017              Statin intolerance    Due to myalgais,  Severe.  Agree with discontinuation of treatment  Lab Results  Component Value Date   CHOL 167 01/11/2015   HDL 54.50 01/11/2015   LDLCALC 98 01/11/2015   TRIG 72.0 01/11/2015   CHOLHDL 3 01/11/2015          Other Visit Diagnoses    Hyperlipidemia LDL goal <100    -  Primary   Relevant Medications   nitroGLYCERIN (NITROLINGUAL) 0.4 MG/SPRAY spray   Other Relevant Orders   Lipid panel   Acquired hypothyroidism       Relevant Orders   TSH      I have discontinued Silvano Rusk. Marts's hydrALAZINE. I have also changed her gabapentin. Additionally, I am having her maintain her Fish Oil, Calcium-Vitamin D, acetaminophen, vitamin C, ferrous sulfate, Cholecalciferol (D3-1000 PO), brimonidine, hydroxychloroquine, latanoprost, amLODipine, folic acid, dorzolamide, losartan, carvedilol, mirabegron ER, lidocaine-prilocaine, ondansetron, prochlorperazine, potassium chloride SA, HYDROcodone-acetaminophen, timolol, mirtazapine, levothyroxine, pantoprazole, ALPRAZolam, and nitroGLYCERIN. We administered cyanocobalamin.  Meds ordered this encounter  Medications  . nitroGLYCERIN (NITROLINGUAL) 0.4 MG/SPRAY spray    Sig: Place 1 spray under the tongue every 5 (five) minutes x 3 doses as needed for chest pain.    Dispense:  4.9 g    Refill:  0  . gabapentin (NEURONTIN) 300 MG capsule    Sig: TAKE ONE CAPSULE BY MOUTH THREE TIMES daily for neuropathy    Dispense:  90 capsule    Refill:  5  . cyanocobalamin ((VITAMIN B-12)) injection 1,000 mcg    Medications Discontinued During This Encounter  Medication Reason  . nitroGLYCERIN (NITROLINGUAL)  0.4 MG/SPRAY spray Reorder    . gabapentin (NEURONTIN) 300 MG capsule Reorder  . hydrALAZINE (APRESOLINE) 10 MG tablet     Follow-up: Return in about 3 months (around 01/16/2018) for follow up hypertension .   [name], MD

## 2017-10-17 ENCOUNTER — Ambulatory Visit: Payer: Medicare Other

## 2017-10-18 NOTE — Assessment & Plan Note (Signed)
Last assessment of Thyroid function  Was in June 2018  And was  WNL . Will repeat next week  Lab Results  Component Value Date   TSH 1.15 01/16/2017

## 2017-10-18 NOTE — Assessment & Plan Note (Signed)
Her blood pressure has been trending down and her medications need adjustment .  Suspending hydralazine for now.  Continue amlodipine and carvedilol

## 2017-10-18 NOTE — Assessment & Plan Note (Signed)
Due to myalgais,  Severe.  Agree with discontinuation of treatment  Lab Results  Component Value Date   CHOL 167 01/11/2015   HDL 54.50 01/11/2015   LDLCALC 98 01/11/2015   TRIG 72.0 01/11/2015   CHOLHDL 3 01/11/2015

## 2017-10-18 NOTE — Assessment & Plan Note (Signed)
BM biopsy failed to show any signs of myelodisplasia

## 2017-10-18 NOTE — Assessment & Plan Note (Addendum)
Occurred post operatively during period of anemia and respiratory failure.  Has been in sinus rhythm,  Paced  Since 2017 when she received Medtronic PM for symptomatic bradycardia.

## 2017-10-18 NOTE — Assessment & Plan Note (Addendum)
Aggravated by second diagnosis of cancer. Symptoms and outlook have Improved,  With better sleep since dose of remeron was increased to 15 mg   No changes today

## 2017-10-19 NOTE — Progress Notes (Signed)
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  HISTORY OF PRESENT ILLNESS:  Patient returns to clinic today for further evaluation and discussion of her imaging results.  She is anxious, but otherwise feels well.  She no longer complains of shortness of breath or cough.  She does not complain of weakness or fatigue today.  She denies any recent fevers or illnesses.  She does not complain of pain today. She has no neurologic complaints. She has a good appetite and denies weight loss.  She denies any chest pain or hemoptysis.  She denies any nausea, vomiting, constipation, or diarrhea.  She has no urinary complaints. Patient offers no specific complaints today.   REVIEW OF SYSTEMS:    Review of Systems  Constitutional: Negative.  Negative for fever, malaise/fatigue and weight loss.  HENT: Negative.   Eyes: Negative.  Negative for blurred vision, double vision and pain.  Respiratory: Negative.  Negative for cough and shortness of breath.   Cardiovascular: Negative.  Negative for chest pain and leg swelling.  Gastrointestinal: Negative.  Negative for abdominal pain, diarrhea and nausea.  Genitourinary: Negative.   Musculoskeletal: Negative.  Negative for falls.  Skin: Negative.  Negative for rash.  Neurological: Negative.  Negative for sensory change and weakness.  Psychiatric/Behavioral: The patient is nervous/anxious.   All other systems reviewed and are negative.   PAST MEDICAL HISTORY: Past Medical History:  Diagnosis Date   Anemia 02/2013   F/U with Dr. Grayland Ormond for  Feraheme injections   Anxiety    Arthritis    Possible RA - Follow up appt with Dr. Jefm Bryant   Cellulitis of arm, right    Diabetes insipidus ([place])    On desmopressin   GERD (gastroesophageal reflux disease)    H/O seasonal allergies    H/O transfusion of packed red blood cells 02/2013   3 units PRBC for severe anemia 02/2013   Herniated disc    History of kidney stones    Hyperlipidemia    Hypertension    Hyperthyroidism    s/p radiation therapy, now hypothyroidism   IBS (irritable bowel syndrome)    Migraine    Pacemaker    a. s/p successful implantation of a Medtronic CapSureFix Novus MRI SureScan serial # [id number] on 06/28/16 by Dr. Caryl Comes for sinus node dysfunction.   Pancreatic cyst    Paroxysmal A-fib [facility])    Renal cell cancer, right ([place]) 10/23/2015   Right partial nephrectomy.    PAST SURGICAL HISTORY: Past Surgical History:  Procedure Laterality Date   ABDOMINAL HYSTERECTOMY     BREAST EXCISIONAL BIOPSY Right 2005   neg   BREAST SURGERY  1990s   Biopsy   CERVICAL SPINE SURGERY     Bone fusion   COLONOSCOPY  2008?   [place]   COLONOSCOPY WITH PROPOFOL N/A 10/15/2016   Procedure: COLONOSCOPY WITH PROPOFOL;  Surgeon: Robert Bellow, MD;  Location: [facility] ENDOSCOPY;  Service: Endoscopy;  Laterality: N/A;   EP IMPLANTABLE DEVICE N/A 06/28/2016   Procedure: Pacemaker Implant;  Surgeon: Deboraha Sprang, MD;  Location: [place] CV LAB;  Service: Cardiovascular;  Laterality: N/A;   HAMMER TOE SURGERY     HERNIA REPAIR Left    Inguinal Hernia Repair   PORTA CATH INSERTION N/A 07/24/2017   Procedure: PORTA CATH INSERTION;  Surgeon: Algernon Huxley, MD;  Location: [place] [facility];  Service: Cardiovascular;  Laterality: N/A;   ROBOTIC ASSITED PARTIAL NEPHRECTOMY Right 10/23/2015   Procedure: ROBOTIC ASSITED PARTIAL NEPHRECTOMY with intraop ultrasound;  Surgeon: Hollice Espy, MD;  Location: [facility] ORS;  Service: Urology;   Laterality: Right;   TUBAL LIGATION      FAMILY HISTORY Family History  Problem Relation Age of Onset   Heart disease Mother    Heart disease Father    Diabetes Brother    Kidney disease Brother    Stroke Daughter        due to medication reaction   Breast cancer Cousin    Prostate cancer Neg Hx    Hematuria Neg Hx     ADVANCED DIRECTIVES:  No flowsheet data found.  HEALTH MAINTENANCE: Social History   Tobacco Use   Smoking status: Former Smoker    Packs/day: 0.50    Years: 30.00    Pack years: 15.00    Types: Cigarettes    Last attempt to quit: 12/03/2012    Years since quitting: 4.8   Smokeless tobacco: Never Used  Substance Use Topics   Alcohol use: No    Alcohol/week: 0.0 oz   Drug use: No     Allergies  Allergen Reactions   Metrizamide     Other reaction(s): Other (See Comments)   Adhesive [Tape] Other (See Comments)    "irritate skin"   Augmentin [Amoxicillin-Pot Clavulanate] Diarrhea    Severe diarrhea   Codeine     REACTION: NAUSEA   Morphine And Related Other (See Comments)    Shut down her organs   Other     Skin irritation   Tetanus Toxoid     REACTION: ARM SWELLING,REDNESS   Tramadol Nausea And Vomiting    Current Outpatient Medications  Medication Sig Dispense Refill   ALPRAZolam (XANAX) 0.25 MG tablet TAKE ONE TABLET BY MOUTH TWICE A DAY AS NEEDED FOR ANXIETY 60 tablet 1   amLODipine (NORVASC) 10 MG tablet Take by mouth.     brimonidine (ALPHAGAN) 0.2 % ophthalmic solution Place 1 drop into both eyes 2 (two) times daily.     Calcium-Vitamin D (SUPER CALCIUM/D) 600-125 MG-UNIT TABS Take 2 tablets by mouth daily.      carvedilol (COREG) 6.25 MG tablet Take 1 tablet (6.25 mg total) 2 (two) times daily by mouth. 180 tablet 3   Cholecalciferol (D3-1000 PO) Take 1,000 Units by mouth daily. Reported on 07/28/2015     dorzolamide (TRUSOPT) 2 % ophthalmic solution      ferrous sulfate 325 (65 FE) MG tablet Take 325 mg  by mouth daily with breakfast.     folic acid (FOLVITE) 1 MG tablet Take by mouth.     gabapentin (NEURONTIN) 300 MG capsule TAKE ONE CAPSULE BY MOUTH THREE TIMES daily for neuropathy 90 capsule 5   HYDROcodone-acetaminophen (NORCO/VICODIN) 5-325 MG tablet      hydroxychloroquine (PLAQUENIL) 200 MG tablet Take 400 mg by mouth daily.      latanoprost (XALATAN) 0.005 % ophthalmic solution Place 1 drop into both eyes at bedtime.      levothyroxine (SYNTHROID, LEVOTHROID) 112 MCG tablet TAKE ONE TABLET BY MOUTH EVERY MORNING BEFORE BREAKFAST 90 tablet 0   lidocaine-prilocaine (EMLA) cream Apply to affected area  once 30 g 3   losartan (COZAAR) 100 MG tablet TAKE ONE TABLET BY MOUTH DAILY. NOTE DOSE CHANGED TO 100MG!! 90 tablet 1   mirabegron ER (MYRBETRIQ) 50 MG TB24 tablet Take 1 tablet (50 mg total) by mouth daily. 30 tablet 2   mirtazapine (REMERON) 15 MG tablet Take 1 tablet (15 mg total) by mouth at bedtime. 30 tablet 2   Omega-3 Fatty Acids (FISH OIL) 1000 MG CAPS Take 1,000 mg by mouth daily.      pantoprazole (PROTONIX) 40 MG tablet TAKE ONE TABLET BY MOUTH DAILY 90 tablet 1   potassium chloride SA (K-DUR,KLOR-CON) 20 MEQ tablet Take 1 tablet (20 mEq total) by mouth daily. 30 tablet 3   timolol (TIMOPTIC) 0.5 % ophthalmic solution      vitamin C (ASCORBIC ACID) 500 MG tablet Take 500 mg by mouth daily.     acetaminophen (TYLENOL) 325 MG tablet Take 2 tablets (650 mg total) by mouth every 6 (six) hours as needed for pain. (Patient not taking: Reported on 10/22/2017)     nitroGLYCERIN (NITROLINGUAL) 0.4 MG/SPRAY spray Place 1 spray under the tongue every 5 (five) minutes x 3 doses as needed for chest pain. (Patient not taking: Reported on 10/22/2017) 4.9 g 0   ondansetron (ZOFRAN) 8 MG tablet Take 1 tablet (8 mg total) by mouth 2 (two) times daily as needed for refractory nausea / vomiting. (Patient not taking: Reported on 10/22/2017) 30 tablet 2   prochlorperazine (COMPAZINE) 10  MG tablet Take 1 tablet (10 mg total) by mouth every 6 (six) hours as needed (Nausea or vomiting). (Patient not taking: Reported on 10/22/2017) 60 tablet 2   No current facility-administered medications for this visit.     OBJECTIVE:  Vitals:   10/22/17 0905  BP: 108/77  Pulse: 71  Resp: 18  Temp: (!) 95.8 F (35.4 C)     Body mass index is 24.55 kg/m.    ECOG FS:0 - Asymptomatic  Physical Exam   General: Well-developed, well-nourished, no acute distress. Eyes: Pink conjunctiva, anicteric sclera. HEENT: Swelling and ecchymosis surrounding both eyes, resolved. Lungs: Clear to auscultation bilaterally. Heart: Regular rate and rhythm. No rubs, murmurs, or gallops. Abdomen: Soft, nontender, nondistended. No organomegaly noted, normoactive bowel sounds. Musculoskeletal: No edema, cyanosis, or clubbing.  Right arm in splint secondary to fracture. Neuro: Alert, answering all questions appropriately. Cranial nerves grossly intact. Skin: No rashes or petechiae noted. Psych: Normal affect.   LAB RESULTS:  Appointment on 10/22/2017  Component Date Value Ref Range Status   WBC 10/22/2017 1.0* 3.6 - 11.0 K/uL Final   Comment: RESULT REPEATED AND VERIFIED [facility]ENTER CRITICAL VALUE PROTOCOL    RBC 10/22/2017 2.75* 3.80 - 5.20 MIL/uL Final   Hemoglobin 10/22/2017 8.6* 12.0 - 16.0 g/dL Final   HCT 10/22/2017 25.5* 35.0 - 47.0 % Final   MCV 10/22/2017 92.7  80.0 - 100.0 fL Final   MCH 10/22/2017 31.3  26.0 - 34.0 pg Final   MCHC 10/22/2017 33.8  32.0 - 36.0 g/dL Final   RDW 10/22/2017 22.7* 11.5 - 14.5 % Final   Platelets 10/22/2017 70* 150 - 440 K/uL Final   Neutrophils Relative % 10/22/2017 12  % Final   Neutro Abs 10/22/2017 0.1* 1.4 - 6.5 K/uL Final   Comment: RESULT REPEATED AND VERIFIED CRITICAL RESULT CALLED TO, READ BACK BY AND VERIFIED WITH: BRENDA ELLINGTON @ 9:05AM 10/22/2017 [facility]    Lymphocytes Relative 10/22/2017 44  % Final   Lymphs Abs 10/22/2017 0.4*  1.0 - 3.6 K/uL Final   Monocytes Relative 10/22/2017 43  % Final   Monocytes Absolute 10/22/2017 0.4  0.2 - 0.9 K/uL Final   Eosinophils Relative 10/22/2017 1  % Final   Eosinophils Absolute 10/22/2017 0.0  0 - 0.7 K/uL Final   Basophils Relative 10/22/2017 0  % Final   Basophils Absolute 10/22/2017 0.0  0 - 0.1 K/uL Final   Performed at [facility], [address] [place], [place] [address]   Sodium 10/22/2017 140  135 - 145 mmol/L Final   Potassium 10/22/2017 3.7  3.5 - 5.1 mmol/L Final   Chloride 10/22/2017 108  101 - 111 mmol/L Final   CO2 10/22/2017 24  22 - 32 mmol/L Final   Glucose, Bld 10/22/2017 100* 65 - 99 mg/dL Final   BUN 10/22/2017 13  6 - 20 mg/dL Final   Creatinine, Ser 10/22/2017 0.71  0.44 - 1.00 mg/dL Final   Calcium 10/22/2017 8.9  8.9 - 10.3 mg/dL Final   Total Protein 10/22/2017 7.1  6.5 - 8.1 g/dL Final   Albumin 10/22/2017 3.9  3.5 - 5.0 g/dL Final   AST 10/22/2017 31  15 - 41 U/L Final   ALT 10/22/2017 15  14 - 54 U/L Final   Alkaline Phosphatase 10/22/2017 77  38 - 126 U/L Final   Total Bilirubin 10/22/2017 0.8  0.3 - 1.2 mg/dL Final   GFR calc non Af Amer 10/22/2017 >60  >60 mL/min Final   GFR calc Af Amer 10/22/2017 >60  >60 mL/min Final   Comment: (NOTE) The eGFR has been calculated using the CKD EPI equation. This calculation has not been validated in all clinical situations. eGFR's persistently <60 mL/min signify possible Chronic Kidney Disease.    Anion gap 10/22/2017 8  5 - 15 Final   Performed at [facility], [address] [place], [place] [address]  Hospital Outpatient Visit on 10/21/2017  Component Date Value Ref Range Status   Glucose-Capillary 10/21/2017 70  65 - 99 mg/dL Final       STUDIES: Nm Pet Image Restag (ps) Skull Base To Thigh  Result Date: 10/22/2017 CLINICAL DATA:  Subsequent treatment strategy for neuroendocrine lung cancer. History of renal cell carcinoma. EXAM: NUCLEAR MEDICINE  PET SKULL BASE TO THIGH TECHNIQUE: 7.5 mCi F-18 FDG was injected intravenously. Full-ring PET imaging was performed from the skull base to thigh after the radiotracer. CT data was obtained and used for attenuation correction and anatomic localization. Fasting blood glucose: 70 mg/dl Mediastinal blood pool activity: SUV max 1.5 COMPARISON:  PET-CT 06/24/2017. FINDINGS: NECK: No hypermetabolic cervical lymph nodes are identified.There are no lesions of the pharyngeal mucosal space. Stable mild symmetric activity within the lymphoid tissue of Waldeyer's ring. Incidental CT findings: Mild carotid atherosclerosis. CHEST: The previously demonstrated hypermetabolic left lower lobe nodule has nearly completely resolved. There is an ill-defined nodular density in this area on image 92/4 without residual hypermetabolic activity. In addition, the left infrahilar node has resolved. There are no hypermetabolic mediastinal, hilar or axillary lymph nodes. Incidental CT findings: Mild emphysema. No new or enlarging pulmonary nodules. Right IJ Port-A-Cath extends to the superior cavoatrial junction. Patient has a left subclavian pacemaker and mild atherosclerosis the aorta, great vessels and coronary arteries. ABDOMEN/PELVIS: There is no hypermetabolic activity within the liver, adrenal glands, spleen or pancreas. There is no hypermetabolic nodal activity. No abnormal activity identified within the kidneys. Incidental CT findings: The right kidney has a grossly stable appearance with a cyst  in its upper interpolar region. Aortic and branch vessel atherosclerosis. SKELETON: There is no hypermetabolic activity to suggest osseous metastatic disease. Incidental CT findings: Grossly stable multilevel spondylosis. IMPRESSION: 1. Interval treatment of the left lower lobe pulmonary nodule and left hilar lymph node. No residual pulmonary nodule or hypermetabolic activity identified within the chest. 2. No evidence of metastatic disease  elsewhere. 3. Grossly stable appearance of the right kidney status post partial right nephrectomy. 4. Aortic Atherosclerosis (ICD10-I70.0) and Emphysema (ICD10-J43.9). Electronically Signed   By: Richardean Sale M.D.   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  Plan:  1. 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  Patient completed cycle 3 of carboplatinum and etoposide on September 19, 2017.  She also completed XRT.  Given her persistent pancytopenia, treatment was discontinued.  PET scan results from October 22, 2017 reviewed independently and reported as above with no residual hypermetabolic activity.  No further intervention is needed at this time.  Return to clinic in 3 months with repeat imaging using PET scan and further evaluation.   2.  Pancytopenia: Previously, bone marrow biopsy revealed no evidence of MDS or other abnormality.  It is possible it is related to her Plaquenil or rheumatoid arthritis. The remainder of her laboratory work was either negative or within normal limits.  No further treatment needed at this time.  Return to clinic in 6 weeks for laboratory check and then in 3 months for further evaluation as above.   3. Renal cell carcinoma of the right kidney: Status post partial nephrectomy. Continue follow-up with urology as indicated. 4.  Rheumatoid arthritis: Continue Plaquenil. Continue follow-up per rheumatology.  5.  Fracture right forearm: Resolved.  Continue follow-up with orthopedics as indicated. 6.  Pancytopenia: Discontinue treatment as above.  Patient expressed understanding and was in agreement with this plan. She also understands that She can call clinic at any time with any questions, concerns, or complaints.    Caroline Huger, MD   10/23/2017 9:39 AM

## 2017-10-21 ENCOUNTER — Ambulatory Visit
Admission: RE | Admit: 2017-10-21 | Discharge: 2017-10-21 | Disposition: A | Discharge status: Home / Self Care | Payer: Medicare Other | Source: Ambulatory Visit | Attending: Oncology | Admitting: Oncology

## 2017-10-21 DIAGNOSIS — Z79899 Other long term (current) drug therapy: Secondary | ICD-10-CM | POA: Diagnosis not present

## 2017-10-21 DIAGNOSIS — I700 Atherosclerosis of aorta: Secondary | ICD-10-CM | POA: Insufficient documentation

## 2017-10-21 DIAGNOSIS — Z905 Acquired absence of kidney: Secondary | ICD-10-CM | POA: Diagnosis not present

## 2017-10-21 DIAGNOSIS — C7A8 Other malignant neuroendocrine tumors: Secondary | ICD-10-CM | POA: Diagnosis not present

## 2017-10-21 DIAGNOSIS — Z79890 Hormone replacement therapy (postmenopausal): Secondary | ICD-10-CM | POA: Diagnosis not present

## 2017-10-21 DIAGNOSIS — J439 Emphysema, unspecified: Secondary | ICD-10-CM | POA: Insufficient documentation

## 2017-10-21 LAB — GLUCOSE, CAPILLARY: GLUCOSE-CAPILLARY: 70 mg/dL (ref 65–99)

## 2017-10-21 MED ORDER — FLUDEOXYGLUCOSE F - 18 (FDG) INJECTION
7.5000 | Freq: Once | INTRAVENOUS | Status: AC | PRN
  Administered 2017-10-21: 7.5 via INTRAVENOUS

## 2017-10-22 ENCOUNTER — Other Ambulatory Visit: Payer: Self-pay

## 2017-10-22 ENCOUNTER — Telehealth: Payer: Self-pay | Admitting: Internal Medicine

## 2017-10-22 ENCOUNTER — Telehealth: Payer: Self-pay | Admitting: *Deleted

## 2017-10-22 ENCOUNTER — Inpatient Hospital Stay (HOSPITAL_BASED_OUTPATIENT_CLINIC_OR_DEPARTMENT_OTHER): Payer: Medicare Other | Admitting: Oncology

## 2017-10-22 ENCOUNTER — Inpatient Hospital Stay: Payer: Medicare Other

## 2017-10-22 VITALS — BP 108/77 | HR 71 | Temp 95.8°F | Resp 18 | Wt 134.2 lb

## 2017-10-22 DIAGNOSIS — Z803 Family history of malignant neoplasm of breast: Secondary | ICD-10-CM

## 2017-10-22 DIAGNOSIS — Z79899 Other long term (current) drug therapy: Secondary | ICD-10-CM

## 2017-10-22 DIAGNOSIS — R5381 Other malaise: Secondary | ICD-10-CM | POA: Diagnosis not present

## 2017-10-22 DIAGNOSIS — C7A8 Other malignant neuroendocrine tumors: Secondary | ICD-10-CM

## 2017-10-22 DIAGNOSIS — Z95828 Presence of other vascular implants and grafts: Secondary | ICD-10-CM

## 2017-10-22 DIAGNOSIS — F419 Anxiety disorder, unspecified: Secondary | ICD-10-CM | POA: Diagnosis not present

## 2017-10-22 DIAGNOSIS — E0590 Thyrotoxicosis, unspecified without thyrotoxic crisis or storm: Secondary | ICD-10-CM | POA: Diagnosis not present

## 2017-10-22 DIAGNOSIS — Z87442 Personal history of urinary calculi: Secondary | ICD-10-CM

## 2017-10-22 DIAGNOSIS — R5383 Other fatigue: Secondary | ICD-10-CM | POA: Diagnosis not present

## 2017-10-22 DIAGNOSIS — I10 Essential (primary) hypertension: Secondary | ICD-10-CM

## 2017-10-22 DIAGNOSIS — R531 Weakness: Secondary | ICD-10-CM | POA: Diagnosis not present

## 2017-10-22 DIAGNOSIS — E890 Postprocedural hypothyroidism: Secondary | ICD-10-CM

## 2017-10-22 DIAGNOSIS — D61818 Other pancytopenia: Secondary | ICD-10-CM | POA: Diagnosis not present

## 2017-10-22 DIAGNOSIS — I480 Paroxysmal atrial fibrillation: Secondary | ICD-10-CM | POA: Diagnosis not present

## 2017-10-22 DIAGNOSIS — E039 Hypothyroidism, unspecified: Secondary | ICD-10-CM

## 2017-10-22 DIAGNOSIS — E785 Hyperlipidemia, unspecified: Secondary | ICD-10-CM

## 2017-10-22 DIAGNOSIS — E232 Diabetes insipidus: Secondary | ICD-10-CM

## 2017-10-22 DIAGNOSIS — C641 Malignant neoplasm of right kidney, except renal pelvis: Secondary | ICD-10-CM | POA: Diagnosis not present

## 2017-10-22 DIAGNOSIS — M069 Rheumatoid arthritis, unspecified: Secondary | ICD-10-CM | POA: Diagnosis not present

## 2017-10-22 DIAGNOSIS — Z87891 Personal history of nicotine dependence: Secondary | ICD-10-CM

## 2017-10-22 DIAGNOSIS — K219 Gastro-esophageal reflux disease without esophagitis: Secondary | ICD-10-CM | POA: Diagnosis not present

## 2017-10-22 LAB — CBC WITH DIFFERENTIAL/PLATELET
BASOS PCT: 0 %
Basophils Absolute: 0 10*3/uL (ref 0–0.1)
EOS PCT: 1 %
Eosinophils Absolute: 0 10*3/uL (ref 0–0.7)
HEMATOCRIT: 25.5 % — AB (ref 35.0–47.0)
Hemoglobin: 8.6 g/dL — ABNORMAL LOW (ref 12.0–16.0)
Lymphocytes Relative: 44 %
Lymphs Abs: 0.4 10*3/uL — ABNORMAL LOW (ref 1.0–3.6)
MCH: 31.3 pg (ref 26.0–34.0)
MCHC: 33.8 g/dL (ref 32.0–36.0)
MCV: 92.7 fL (ref 80.0–100.0)
MONO ABS: 0.4 10*3/uL (ref 0.2–0.9)
MONOS PCT: 43 %
NEUTROS ABS: 0.1 10*3/uL — AB (ref 1.4–6.5)
Neutrophils Relative %: 12 %
PLATELETS: 70 10*3/uL — AB (ref 150–440)
RBC: 2.75 MIL/uL — ABNORMAL LOW (ref 3.80–5.20)
RDW: 22.7 % — AB (ref 11.5–14.5)
WBC: 1 10*3/uL — CL (ref 3.6–11.0)

## 2017-10-22 LAB — COMPREHENSIVE METABOLIC PANEL
ALBUMIN: 3.9 g/dL (ref 3.5–5.0)
ALT: 15 U/L (ref 14–54)
ANION GAP: 8 (ref 5–15)
AST: 31 U/L (ref 15–41)
Alkaline Phosphatase: 77 U/L (ref 38–126)
BILIRUBIN TOTAL: 0.8 mg/dL (ref 0.3–1.2)
BUN: 13 mg/dL (ref 6–20)
CO2: 24 mmol/L (ref 22–32)
Calcium: 8.9 mg/dL (ref 8.9–10.3)
Chloride: 108 mmol/L (ref 101–111)
Creatinine, Ser: 0.71 mg/dL (ref 0.44–1.00)
GFR calc Af Amer: >60 mL/min (ref >60)
GFR calc non Af Amer: >60 mL/min (ref >60)
GLUCOSE: 100 mg/dL — AB (ref 65–99)
POTASSIUM: 3.7 mmol/L (ref 3.5–5.1)
Sodium: 140 mmol/L (ref 135–145)
TOTAL PROTEIN: 7.1 g/dL (ref 6.5–8.1)

## 2017-10-22 MED ORDER — HEPARIN SOD (PORK) LOCK FLUSH 100 UNIT/ML IV SOLN
500.0000 [IU] | Freq: Once | INTRAVENOUS | Status: AC
  Administered 2017-10-22: 500 [IU] via INTRAVENOUS

## 2017-10-22 MED ORDER — HEPARIN SOD (PORK) LOCK FLUSH 100 UNIT/ML IV SOLN
INTRAVENOUS | Status: AC
  Filled 2017-10-22: qty 5

## 2017-10-22 NOTE — Telephone Encounter (Signed)
Please advise 

## 2017-10-22 NOTE — Telephone Encounter (Signed)
Lab called [facility] 0.1. Patient for chemotherapy today

## 2017-10-22 NOTE — Telephone Encounter (Unsigned)
Copied from [place] [phone]. Topic: Quick Communication - See Telephone Encounter >> Oct 22, 2017 11:53 AM Caroline Nichols wrote: Pt needing lab orders and appointment.  [facility] could not do labs for pt's Tyroid and cholesterol checked.

## 2017-10-22 NOTE — Progress Notes (Signed)
Here for follow up. Per pt "doing as well as can be expected " hctz d/c by Dr Derrel Nip

## 2017-10-23 ENCOUNTER — Ambulatory Visit: Payer: Medicare Other

## 2017-10-23 NOTE — Telephone Encounter (Signed)
LMTCB. Need to schedule pt for a fasting lab appt. [facility] may speak with pt.

## 2017-10-24 ENCOUNTER — Ambulatory Visit: Payer: Medicare Other

## 2017-10-27 NOTE — Telephone Encounter (Signed)
Pt has a lab appt scheduled for 11/19/2017

## 2017-10-29 DIAGNOSIS — M0579 Rheumatoid arthritis with rheumatoid factor of multiple sites without organ or systems involvement: Secondary | ICD-10-CM | POA: Diagnosis not present

## 2017-10-29 DIAGNOSIS — G629 Polyneuropathy, unspecified: Secondary | ICD-10-CM | POA: Diagnosis not present

## 2017-10-29 DIAGNOSIS — D72819 Decreased white blood cell count, unspecified: Secondary | ICD-10-CM | POA: Diagnosis not present

## 2017-10-29 DIAGNOSIS — R748 Abnormal levels of other serum enzymes: Secondary | ICD-10-CM | POA: Diagnosis not present

## 2017-10-30 ENCOUNTER — Other Ambulatory Visit: Payer: Self-pay | Admitting: Oncology

## 2017-11-10 ENCOUNTER — Encounter: Payer: Self-pay | Admitting: Internal Medicine

## 2017-11-11 ENCOUNTER — Telehealth: Payer: Self-pay

## 2017-11-11 NOTE — Telephone Encounter (Signed)
Copied from [place] [phone]. Topic: Quick Communication - Office Called Patient >> Nov 11, 2017  9:59 AM Corie Chiquito, [place] wrote: Reason for CRM: Patient had a missed from the office and would like a call back at [phone]

## 2017-11-19 ENCOUNTER — Ambulatory Visit: Payer: Medicare Other

## 2017-11-19 ENCOUNTER — Encounter: Payer: Self-pay | Admitting: Internal Medicine

## 2017-11-19 ENCOUNTER — Ambulatory Visit (INDEPENDENT_AMBULATORY_CARE_PROVIDER_SITE_OTHER): Payer: Medicare Other | Admitting: Internal Medicine

## 2017-11-19 VITALS — BP 126/78 | HR 75 | Temp 98.3°F | Resp 15 | Ht 62.0 in | Wt 140.0 lb

## 2017-11-19 DIAGNOSIS — I10 Essential (primary) hypertension: Secondary | ICD-10-CM | POA: Diagnosis not present

## 2017-11-19 DIAGNOSIS — D509 Iron deficiency anemia, unspecified: Secondary | ICD-10-CM | POA: Diagnosis not present

## 2017-11-19 DIAGNOSIS — F329 Major depressive disorder, single episode, unspecified: Secondary | ICD-10-CM | POA: Diagnosis not present

## 2017-11-19 DIAGNOSIS — R600 Localized edema: Secondary | ICD-10-CM

## 2017-11-19 DIAGNOSIS — E538 Deficiency of other specified B group vitamins: Secondary | ICD-10-CM

## 2017-11-19 DIAGNOSIS — E876 Hypokalemia: Secondary | ICD-10-CM | POA: Diagnosis not present

## 2017-11-19 DIAGNOSIS — D61818 Other pancytopenia: Secondary | ICD-10-CM

## 2017-11-19 MED ORDER — FUROSEMIDE 20 MG PO TABS: 10.0000 mg | ORAL_TABLET | Freq: Every day | ORAL | 4 refills | Status: DC

## 2017-11-19 MED ORDER — CYANOCOBALAMIN 1000 MCG/ML IJ SOLN
1000.0000 ug | Freq: Once | INTRAMUSCULAR | Status: AC
  Administered 2017-11-19: 1000 ug via INTRAMUSCULAR

## 2017-11-19 NOTE — Patient Instructions (Addendum)
For your blood pressure:   Do not resume hydralazine  Take the amlodipine at night  Continue losartan in the morning  Continue carvedilol two times daily   Add 10 mg lasix in the morning (1/2 tablet)    No labs today ;  Return next week for fasting labs. So I can check potassium

## 2017-11-19 NOTE — Progress Notes (Signed)
Subjective:  Patient ID: Caroline Nichols, female    DOB: 10-06-1940  Age: 77 y.o. MRN: [id number]  CC: The primary encounter diagnosis was Hypokalemia. Diagnoses of Iron deficiency anemia, unspecified iron deficiency anemia type, B12 deficiency, Edema of both legs, Essential hypertension, Reactive depression, Other pancytopenia ([place]), and Acquired pancytopenia ([place]) were also pertinent to this visit.  HPI Caroline Nichols presents for follow up on depression andhypertension   Stopped hydralazine per last visit  Due to hypotension.  Still taking amlodipine,  carvedilol and losartan. Since then she has developed ankle edema and facial swelling .  Has added back  one dose of hydralazine in the evening with 6.25 coreg,  Takes amlodipine, losartan  and coreg in the am. For  The last 3 weeks  morning BPs have been elevated to 160 before she takes her medication .  She has had a  weight gain of 6 lbs since march 20th.  Feels better,  Appetite improved. Not short of breath   Eating ice. 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 Grayland Ormond in May for anemia follow up.     Has had Statin intolerance and muscle enzyme elevation which improved with statin suspension   . Lab Results  Component Value Date   IRON 41 (L) 08/02/2013   TIBC 331 08/02/2013   FERRITIN 66 08/02/2013     Lab Results  Component Value Date   TSH 1.15 01/16/2017     Outpatient Medications Prior to Visit  Medication Sig Dispense Refill  . ALPRAZolam (XANAX) 0.25 MG tablet TAKE ONE TABLET BY MOUTH TWICE A DAY AS NEEDED FOR ANXIETY 60 tablet 1  . amLODipine (NORVASC) 10 MG tablet Take by mouth.    . brimonidine (ALPHAGAN) 0.2 % ophthalmic solution Place 1 drop into both eyes 2 (two) times daily.    . Calcium-Vitamin D (SUPER CALCIUM/D) 600-125 MG-UNIT TABS Take 2 tablets by mouth daily.     . carvedilol (COREG) 6.25 MG tablet Take 1 tablet (6.25 mg  total) 2 (two) times daily by mouth. 180 tablet 3  . Cholecalciferol (D3-1000 PO) Take 1,000 Units by mouth daily. Reported on 07/28/2015    . dorzolamide (TRUSOPT) 2 % ophthalmic solution     . ferrous sulfate 325 (65 FE) MG tablet Take 325 mg by mouth daily with breakfast.    . folic acid (FOLVITE) 1 MG tablet Take by mouth.    . gabapentin (NEURONTIN) 300 MG capsule TAKE ONE CAPSULE BY MOUTH THREE TIMES daily for neuropathy 90 capsule 5  . HYDROcodone-acetaminophen (NORCO/VICODIN) 5-325 MG tablet     . hydroxychloroquine (PLAQUENIL) 200 MG tablet Take 400 mg by mouth daily.     Marland Kitchen latanoprost (XALATAN) 0.005 % ophthalmic solution Place 1 drop into both eyes at bedtime.     Marland Kitchen levothyroxine (SYNTHROID, LEVOTHROID) 112 MCG tablet TAKE ONE TABLET BY MOUTH EVERY MORNING BEFORE BREAKFAST 90 tablet 0  . lidocaine-prilocaine (EMLA) cream Apply to affected area once 30 g 3  . losartan (COZAAR) 100 MG tablet TAKE ONE TABLET BY MOUTH DAILY. NOTE DOSE CHANGED TO 100MG !! 90 tablet 1  . mirabegron ER (MYRBETRIQ) 50 MG TB24 tablet Take 1 tablet (50 mg total) by mouth daily. 30 tablet 2  . mirtazapine (REMERON) 15 MG tablet TAKE ONE TABLET BY MOUTH EVERY NIGHT AT BEDTIME 30 tablet 1  . nitroGLYCERIN (NITROLINGUAL) 0.4 MG/SPRAY  spray Place 1 spray under the tongue every 5 (five) minutes x 3 doses as needed for chest pain. 4.9 g 0  . Omega-3 Fatty Acids (FISH OIL) 1000 MG CAPS Take 1,000 mg by mouth daily.     . pantoprazole (PROTONIX) 40 MG tablet TAKE ONE TABLET BY MOUTH DAILY 90 tablet 1  . potassium chloride SA (K-DUR,KLOR-CON) 20 MEQ tablet Take 1 tablet (20 mEq total) by mouth daily. 30 tablet 3  . timolol (TIMOPTIC) 0.5 % ophthalmic solution     . vitamin C (ASCORBIC ACID) 500 MG tablet Take 500 mg by mouth daily.    Marland Kitchen acetaminophen (TYLENOL) 325 MG tablet Take 2 tablets (650 mg total) by mouth every 6 (six) hours as needed for pain. (Patient not taking: Reported on 10/22/2017)    . ondansetron (ZOFRAN) 8  MG tablet Take 1 tablet (8 mg total) by mouth 2 (two) times daily as needed for refractory nausea / vomiting. (Patient not taking: Reported on 10/22/2017) 30 tablet 2  . prochlorperazine (COMPAZINE) 10 MG tablet Take 1 tablet (10 mg total) by mouth every 6 (six) hours as needed (Nausea or vomiting). (Patient not taking: Reported on 10/22/2017) 60 tablet 2   No facility-administered medications prior to visit.     Review of Systems;  Patient denies headache, fevers, malaise, unintentional weight loss, skin rash, eye pain, sinus congestion and sinus pain, sore throat, dysphagia,  hemoptysis , cough, dyspnea, wheezing, chest pain, palpitations, orthopnea, edema, abdominal pain, nausea, melena, diarrhea, constipation, flank pain, dysuria, hematuria, urinary  Frequency, nocturia, numbness, tingling, seizures,  Focal weakness, Loss of consciousness,  Tremor, insomnia, depression, anxiety, and suicidal ideation.      Objective:  BP 126/78 (BP Location: Left Arm, Patient Position: Sitting, Cuff Size: Normal)   Pulse 75   Temp 98.3 F (36.8 C) (Oral)   Resp 15   Ht 5\' 2"  (1.575 m)   Wt 140 lb (63.5 kg)   SpO2 93%   BMI 25.61 kg/m   BP Readings from Last 3 Encounters:  11/19/17 126/78  10/22/17 108/77  10/16/17 (!) 100/58    Wt Readings from Last 3 Encounters:  11/19/17 140 lb (63.5 kg)  10/22/17 134 lb 3.2 oz (60.9 kg)  10/16/17 135 lb 9.6 oz (61.5 kg)    General appearance: alert, cooperative and appears stated age Ears: normal TM's and external ear canals both ears Throat: lips, mucosa, and tongue normal; teeth and gums normal Neck: no adenopathy, no carotid bruit, supple, symmetrical, trachea midline and thyroid not enlarged, symmetric, no tenderness/mass/nodules Back: symmetric, no curvature. ROM normal. No CVA tenderness. Lungs: clear to auscultation bilaterally Heart: regular rate and rhythm, S1, S2 normal, no murmur, click, rub or gallop Abdomen: soft, non-tender; bowel sounds  normal; no masses,  no organomegaly Pulses: 2+ and symmetric Skin: Skin color, texture, turgor normal. No rashes or lesions Lymph nodes: Cervical, supraclavicular, and axillary nodes normal.  Lab Results  Component Value Date   HGBA1C 5.3 06/09/2014   HGBA1C 5.2 12/27/2012    Lab Results  Component Value Date   CREATININE 0.71 10/22/2017   CREATININE 0.76 10/15/2017   CREATININE 0.66 10/08/2017    Lab Results  Component Value Date   WBC 1.0 (LL) 10/22/2017   HGB 8.6 (L) 10/22/2017   HCT 25.5 (L) 10/22/2017   PLT 70 (L) 10/22/2017   GLUCOSE 100 (H) 10/22/2017   CHOL 167 01/11/2015   TRIG 72.0 01/11/2015   HDL 54.50 01/11/2015   LDLCALC 98 01/11/2015  ALT 15 10/22/2017   AST 31 10/22/2017   NA 140 10/22/2017   K 3.7 10/22/2017   CL 108 10/22/2017   CREATININE 0.71 10/22/2017   BUN 13 10/22/2017   CO2 24 10/22/2017   TSH 1.15 01/16/2017   INR 1.14 07/08/2017   HGBA1C 5.3 06/09/2014   MICROALBUR 0.7 06/09/2014    Nm Pet Image Restag (ps) Skull Base To Thigh  Result Date: 10/22/2017 CLINICAL DATA:  Subsequent treatment strategy for neuroendocrine lung cancer. History of renal cell carcinoma. EXAM: NUCLEAR MEDICINE PET SKULL BASE TO THIGH TECHNIQUE: 7.5 mCi F-18 FDG was injected intravenously. Full-ring PET imaging was performed from the skull base to thigh after the radiotracer. CT data was obtained and used for attenuation correction and anatomic localization. Fasting blood glucose: 70 mg/dl Mediastinal blood pool activity: SUV max 1.5 COMPARISON:  PET-CT 06/24/2017. FINDINGS: NECK: No hypermetabolic cervical lymph nodes are identified.There are no lesions of the pharyngeal mucosal space. Stable mild symmetric activity within the lymphoid tissue of Waldeyer's ring. Incidental CT findings: Mild carotid atherosclerosis. CHEST: The previously demonstrated hypermetabolic left lower lobe nodule has nearly completely resolved. There is an ill-defined nodular density in this area  on image 92/4 without residual hypermetabolic activity. In addition, the left infrahilar node has resolved. There are no hypermetabolic mediastinal, hilar or axillary lymph nodes. Incidental CT findings: Mild emphysema. No new or enlarging pulmonary nodules. Right IJ Port-A-Cath extends to the superior cavoatrial junction. Patient has a left subclavian pacemaker and mild atherosclerosis the aorta, great vessels and coronary arteries. ABDOMEN/PELVIS: There is no hypermetabolic activity within the liver, adrenal glands, spleen or pancreas. There is no hypermetabolic nodal activity. No abnormal activity identified within the kidneys. Incidental CT findings: The right kidney has a grossly stable appearance with a cyst in its upper interpolar region. Aortic and branch vessel atherosclerosis. SKELETON: There is no hypermetabolic activity to suggest osseous metastatic disease. Incidental CT findings: Grossly stable multilevel spondylosis. IMPRESSION: 1. Interval treatment of the left lower lobe pulmonary nodule and left hilar lymph node. No residual pulmonary nodule or hypermetabolic activity identified within the chest. 2. No evidence of metastatic disease elsewhere. 3. Grossly stable appearance of the right kidney status post partial right nephrectomy. 4. Aortic Atherosclerosis (ICD10-I70.0) and Emphysema (ICD10-J43.9). Electronically Signed   By: Richardean Sale M.D.   On: 10/22/2017 08:32    Assessment & Plan:   Problem List Items Addressed This Visit    Hypokalemia - Primary   Relevant Orders   Basic metabolic panel   D63 deficiency   Relevant Medications   cyanocobalamin ((VITAMIN B-12)) injection 1,000 mcg (Completed)   Acquired pancytopenia ([place])   Relevant Orders   CBC with Differential/Platelet   RESOLVED: Other pancytopenia ([place])    With iron deficiency, b12 deficiency. Managed with supplementation.    Lab Results  Component Value Date   WBC 1.0 (LL) 10/22/2017   HGB 8.6 (L) 10/22/2017    HCT 25.5 (L) 10/22/2017   MCV 92.7 10/22/2017   PLT 70 (L) 10/22/2017         Essential hypertension    Adding lasix in the am.  changing amlodipine to evening       Relevant Medications   furosemide (LASIX) 20 MG tablet   Edema of both legs    Likely secondary to vasodilators used to control BP. Stopping hydralazine, Adding lasix 10 mg daily       Depression     Mood and appetite have improved on 15 mg dose  o f remeron.  She has gained 6 lbs since last visit one month ago.   No changes today        Other Visit Diagnoses    Iron deficiency anemia, unspecified iron deficiency anemia type       Relevant Medications   cyanocobalamin ((VITAMIN B-12)) injection 1,000 mcg (Completed)   Other Relevant Orders   Iron, TIBC and Ferritin Panel    A total of 25 minutes of face to face time was spent with patient more than half of which was spent in counselling about the above mentioned conditions  and coordination of care   I am having [name] start on furosemide. I am also having her maintain her Fish Oil, Calcium-Vitamin D, acetaminophen, vitamin C, ferrous sulfate, Cholecalciferol (D3-1000 PO), brimonidine, hydroxychloroquine, latanoprost, amLODipine, folic acid, dorzolamide, losartan, carvedilol, mirabegron ER, lidocaine-prilocaine, ondansetron, prochlorperazine, potassium chloride SA, HYDROcodone-acetaminophen, timolol, levothyroxine, pantoprazole, ALPRAZolam, nitroGLYCERIN, gabapentin, and mirtazapine. We administered cyanocobalamin.  Meds ordered this encounter  Medications  . furosemide (LASIX) 20 MG tablet    Sig: Take 0.5 tablets (10 mg total) by mouth daily.    Dispense:  45 tablet    Refill:  4    Please do the lipid and tsh as well that were ordered previously   . cyanocobalamin ((VITAMIN B-12)) injection 1,000 mcg    There are no discontinued medications.  Follow-up: Return for fasting labs next week (by Wed) .   [name], MD

## 2017-11-21 ENCOUNTER — Other Ambulatory Visit: Payer: Self-pay | Admitting: Internal Medicine

## 2017-11-21 NOTE — Assessment & Plan Note (Addendum)
Adding lasix in the am.  changing amlodipine to evening

## 2017-11-21 NOTE — Assessment & Plan Note (Signed)
With iron deficiency, b12 deficiency. Managed with supplementation.    Lab Results  Component Value Date   WBC 1.0 (LL) 10/22/2017   HGB 8.6 (L) 10/22/2017   HCT 25.5 (L) 10/22/2017   MCV 92.7 10/22/2017   PLT 70 (L) 10/22/2017

## 2017-11-21 NOTE — Assessment & Plan Note (Addendum)
Likely secondary to vasodilators used to control BP. Stopping hydralazine, Adding lasix 10 mg daily

## 2017-11-21 NOTE — Assessment & Plan Note (Signed)
Mood and appetite have improved on 15 mg dose o f remeron.  She has gained 6 lbs since last visit one month ago.   No changes today

## 2017-11-26 ENCOUNTER — Telehealth: Payer: Self-pay | Admitting: Radiology

## 2017-11-26 ENCOUNTER — Other Ambulatory Visit (INDEPENDENT_AMBULATORY_CARE_PROVIDER_SITE_OTHER): Payer: Medicare Other

## 2017-11-26 ENCOUNTER — Telehealth: Payer: Self-pay | Admitting: Internal Medicine

## 2017-11-26 DIAGNOSIS — E785 Hyperlipidemia, unspecified: Secondary | ICD-10-CM

## 2017-11-26 DIAGNOSIS — D509 Iron deficiency anemia, unspecified: Secondary | ICD-10-CM

## 2017-11-26 DIAGNOSIS — D61818 Other pancytopenia: Secondary | ICD-10-CM

## 2017-11-26 DIAGNOSIS — E876 Hypokalemia: Secondary | ICD-10-CM | POA: Diagnosis not present

## 2017-11-26 DIAGNOSIS — E890 Postprocedural hypothyroidism: Secondary | ICD-10-CM | POA: Diagnosis not present

## 2017-11-26 LAB — BASIC METABOLIC PANEL
BUN: 14 mg/dL (ref 7–25)
CALCIUM: 9.7 mg/dL (ref 8.6–10.4)
CO2: 25 mmol/L (ref 20–32)
CREATININE: 0.78 mg/dL (ref 0.60–0.93)
Chloride: 108 mmol/L (ref 98–110)
GLUCOSE: 87 mg/dL (ref 65–99)
Potassium: 4.1 mmol/L (ref 3.5–5.3)
Sodium: 144 mmol/L (ref 135–146)

## 2017-11-26 LAB — IRON,TIBC AND FERRITIN PANEL
%SAT: 15 % (ref 11–50)
FERRITIN: 133 ng/mL (ref 20–288)
IRON: 42 ug/dL — AB (ref 45–160)
TIBC: 281 ug/dL (ref 250–450)

## 2017-11-26 LAB — LIPID PANEL
CHOLESTEROL: 166 mg/dL (ref <200)
HDL: 60 mg/dL (ref >50)
LDL CHOLESTEROL (CALC): 89 mg/dL
Non-HDL Cholesterol (Calc): 106 mg/dL (calc) (ref <130)
Total CHOL/HDL Ratio: 2.8 (calc) (ref <5.0)
Triglycerides: 78 mg/dL (ref <150)

## 2017-11-26 LAB — TSH: TSH: 1.19 mIU/L (ref 0.40–4.50)

## 2017-11-26 NOTE — Addendum Note (Signed)
Addended by: Arby Barrette on: 11/26/2017 10:17 AM   Modules accepted: Orders

## 2017-11-26 NOTE — Telephone Encounter (Signed)
Pt came in for labs today. Confirmed pt DOB and name. Advised pt which arm to use. Pt stated she would like to use her left arm and to use a butterfly. Attempted left arm medical AC vein. Butterfly needle was used. Pt jumped when attempting first draw. Advised pt that I was unable to get blood out of her left arm due to her jumping which misplaced needle. Pt apologized and stated, "I'm sorry I don't like needles and jump sometimes." Advised pt it was ok and asked if I could look at her right arm. Pt agreed. Advised pt I was going to use her right lateral vein. Stuck pt a second time and was successful. Pt jumped again on second draw. Pt stated while needle still in arm, "That hurt, that is not a butterfly needle." Advised pt that needle was a pediatric butterfly needle. Pt again stated, "No you don't know which needle you are using." Showed pt the wings on the butterfly needle and while drawing pt's blood pt put her hand in directly in my face. Placed Band-Aid on left arm.

## 2017-11-26 NOTE — Telephone Encounter (Signed)
Pt came in for labs today and wanted a call back in regaurds to her rx that are sent to [facility]  Please advise pt [phone] Pt is unavailable from 12-2

## 2017-11-27 ENCOUNTER — Other Ambulatory Visit: Payer: Self-pay

## 2017-11-27 DIAGNOSIS — H2513 Age-related nuclear cataract, bilateral: Secondary | ICD-10-CM | POA: Diagnosis not present

## 2017-11-27 DIAGNOSIS — H401132 Primary open-angle glaucoma, bilateral, moderate stage: Secondary | ICD-10-CM | POA: Diagnosis not present

## 2017-11-27 LAB — CBC WITH DIFFERENTIAL/PLATELET
BASOS PCT: 0.2 %
Basophils Absolute: 9 cells/uL (ref 0–200)
Eosinophils Absolute: 9 cells/uL — ABNORMAL LOW (ref 15–500)
Eosinophils Relative: 0.2 %
HCT: 32.3 % — ABNORMAL LOW (ref 35.0–45.0)
Hemoglobin: 10.7 g/dL — ABNORMAL LOW (ref 11.7–15.5)
Lymphs Abs: 874 cells/uL (ref 850–3900)
MCH: 31.6 pg (ref 27.0–33.0)
MCHC: 33.1 g/dL (ref 32.0–36.0)
MCV: 95.3 fL (ref 80.0–100.0)
MPV: 11.4 fL (ref 7.5–12.5)
Monocytes Relative: 54.3 %
NEUTROS ABS: 1255 {cells}/uL — AB (ref 1500–7800)
Neutrophils Relative %: 26.7 %
PLATELETS: 95 10*3/uL — AB (ref 140–400)
RBC: 3.39 10*6/uL — AB (ref 3.80–5.10)
RDW: 15.8 % — ABNORMAL HIGH (ref 11.0–15.0)
Total Lymphocyte: 18.6 %
WBC: 4.7 10*3/uL (ref 3.8–10.8)
WBCMIX: 2552 {cells}/uL — AB (ref 200–950)

## 2017-11-27 NOTE — Telephone Encounter (Signed)
Spoke with pt and she stated that she is having trouble with getting her medications refilled. She stated that she isn't sure if something on the pharmacy's side or our side. I explained to the pt that would never not refill something unless the provider said not to or we just never got the refill request. I also explained to the pt that if she notices that she is getting down to the last 3 or 4 days worth of medication and she hasn't heard anything from the pharmacy to please contact our office so we can get it refilled before she runs out.

## 2017-11-27 NOTE — Telephone Encounter (Signed)
Refilled: 09/23/2017 Last OV: 11/19/2017 Next OV: 02/18/2018  Spoke with pt and she stated that she only has one tablet left.

## 2017-11-27 NOTE — Telephone Encounter (Signed)
LMTCB. Please transfer pt to our office.  

## 2017-11-28 MED ORDER — ALPRAZOLAM 0.25 MG PO TABS: ORAL_TABLET | ORAL | 0 refills | Status: DC

## 2017-11-28 NOTE — Telephone Encounter (Signed)
Printed, signed and faxed.  

## 2017-11-28 NOTE — Telephone Encounter (Signed)
rx ok'd for xanax #60 with no refills.

## 2017-12-01 ENCOUNTER — Other Ambulatory Visit: Payer: Self-pay | Admitting: Family Medicine

## 2017-12-01 MED ORDER — MIRABEGRON ER 50 MG PO TB24: 50.0000 mg | ORAL_TABLET | Freq: Every day | ORAL | 2 refills | Status: DC

## 2017-12-02 ENCOUNTER — Telehealth: Payer: Self-pay | Admitting: *Deleted

## 2017-12-02 MED ORDER — MIRTAZAPINE 15 MG PO TABS: 15.0000 mg | ORAL_TABLET | Freq: Every day | ORAL | 1 refills | Status: DC

## 2017-12-02 NOTE — Telephone Encounter (Signed)
Pt called in to request refill of remeron and states that pharmacy does not have any refills on file currently. Per Dr. Grayland Ormond, okay to call in refill. Refill sent to [facility]. Pt made aware.

## 2017-12-03 ENCOUNTER — Inpatient Hospital Stay: Payer: Medicare Other | Attending: Oncology

## 2017-12-03 DIAGNOSIS — Z905 Acquired absence of kidney: Secondary | ICD-10-CM | POA: Diagnosis not present

## 2017-12-03 DIAGNOSIS — C641 Malignant neoplasm of right kidney, except renal pelvis: Secondary | ICD-10-CM | POA: Diagnosis not present

## 2017-12-03 DIAGNOSIS — C7A8 Other malignant neuroendocrine tumors: Secondary | ICD-10-CM

## 2017-12-03 DIAGNOSIS — Z9221 Personal history of antineoplastic chemotherapy: Secondary | ICD-10-CM | POA: Diagnosis not present

## 2017-12-03 DIAGNOSIS — Z452 Encounter for adjustment and management of vascular access device: Secondary | ICD-10-CM | POA: Diagnosis not present

## 2017-12-03 DIAGNOSIS — Z923 Personal history of irradiation: Secondary | ICD-10-CM | POA: Insufficient documentation

## 2017-12-03 DIAGNOSIS — Z95828 Presence of other vascular implants and grafts: Secondary | ICD-10-CM

## 2017-12-03 MED ORDER — HEPARIN SOD (PORK) LOCK FLUSH 100 UNIT/ML IV SOLN
500.0000 [IU] | INTRAVENOUS | Status: AC | PRN
  Administered 2017-12-03: 500 [IU]

## 2017-12-03 MED ORDER — SODIUM CHLORIDE 0.9% FLUSH
10.0000 mL | INTRAVENOUS | Status: AC | PRN
  Administered 2017-12-03: 10 mL
  Filled 2017-12-03: qty 10

## 2017-12-10 ENCOUNTER — Telehealth: Payer: Self-pay | Admitting: Internal Medicine

## 2017-12-10 ENCOUNTER — Telehealth: Payer: Self-pay

## 2017-12-10 MED ORDER — LEVOTHYROXINE SODIUM 112 MCG PO TABS: 112.0000 ug | ORAL_TABLET | Freq: Every day | ORAL | 1 refills | Status: DC

## 2017-12-10 NOTE — Telephone Encounter (Signed)
Spoke with pt and informed her of her lab results from 11/26/2017. Pt gave a verbal understanding. Also see previous message.

## 2017-12-10 NOTE — Telephone Encounter (Signed)
LMTCB. PEC may speak with pt.  

## 2017-12-10 NOTE — Telephone Encounter (Signed)
Copied from [place] [id number]. Topic: Quick Communication - Lab Results >> Dec 10, 2017 10:04 AM Robina Ade, Helene Kelp D wrote: Patient called and would like her lab results given to her from 12/03/17 OV. Please call patient back, thanks.

## 2017-12-10 NOTE — Telephone Encounter (Signed)
Spoke with pt and informed her of her lab results from 11/26/2017. Pt stated that she does not have mychart. Since labs were normal refilled her levothyroxine. The pt stated that she is having trouble cutting her lasix in half. She stated that the pharmacy told her they do not have a 10mg  tablet the only thing that 10mg  comes in is liquid. Pt isn't sure what she needs to do but when she cuts the lasix in half it crumbles.

## 2017-12-10 NOTE — Telephone Encounter (Signed)
Please advise 

## 2017-12-10 NOTE — Telephone Encounter (Signed)
She  can either take the lasix 20 mg every other day or I can switch her to torsemide 5 mg daily

## 2017-12-10 NOTE — Telephone Encounter (Signed)
Copied from [place] [id number]. Topic: Quick Communication - Rx Refill/Question >> Dec 10, 2017 10:06 AM Robina Ade, Helene Kelp D wrote: Medication: furosemide (LASIX) 20 MG tablet Has the patient contacted their pharmacy? Yes, but pt would like a different way to take it since it is hard for her to cut the pill in two, if maybe there is liquid she can take. Please call patient back, thanks. (Agent: If no, request that the patient contact the pharmacy for the refill.) Preferred Pharmacy (with phone number or street name): [place], [place] Agent: Please be advised that RX refills may take up to 3 business days. We ask that you follow-up with your pharmacy.

## 2017-12-11 ENCOUNTER — Ambulatory Visit (INDEPENDENT_AMBULATORY_CARE_PROVIDER_SITE_OTHER): Payer: Medicare Other | Admitting: *Deleted

## 2017-12-11 DIAGNOSIS — I495 Sick sinus syndrome: Secondary | ICD-10-CM

## 2017-12-11 NOTE — Telephone Encounter (Signed)
Please advise 

## 2017-12-11 NOTE — Telephone Encounter (Signed)
See previous message. Having been trying to get in touch with pt about this.

## 2017-12-11 NOTE — Progress Notes (Signed)
Remote pacemaker transmission.   

## 2017-12-11 NOTE — Telephone Encounter (Signed)
LMTCB. PEC may speak with pt.  

## 2017-12-11 NOTE — Telephone Encounter (Signed)
Patient said she will take the lasix 20mg  every other day and document it

## 2017-12-11 NOTE — Telephone Encounter (Signed)
FYI

## 2017-12-12 NOTE — Telephone Encounter (Signed)
FYI

## 2017-12-16 ENCOUNTER — Encounter: Payer: Self-pay | Admitting: Cardiology

## 2017-12-18 ENCOUNTER — Ambulatory Visit
Admission: RE | Admit: 2017-12-18 | Discharge: 2017-12-18 | Disposition: A | Discharge status: Home / Self Care | Payer: Medicare Other | Source: Ambulatory Visit | Attending: Radiation Oncology | Admitting: Radiation Oncology

## 2017-12-18 ENCOUNTER — Ambulatory Visit: Payer: Self-pay | Admitting: *Deleted

## 2017-12-18 DIAGNOSIS — J432 Centrilobular emphysema: Secondary | ICD-10-CM

## 2017-12-18 DIAGNOSIS — I2510 Atherosclerotic heart disease of native coronary artery without angina pectoris: Secondary | ICD-10-CM | POA: Insufficient documentation

## 2017-12-18 DIAGNOSIS — J90 Pleural effusion, not elsewhere classified: Secondary | ICD-10-CM | POA: Insufficient documentation

## 2017-12-18 DIAGNOSIS — C7A8 Other malignant neuroendocrine tumors: Secondary | ICD-10-CM | POA: Insufficient documentation

## 2017-12-18 DIAGNOSIS — J438 Other emphysema: Secondary | ICD-10-CM

## 2017-12-18 DIAGNOSIS — R918 Other nonspecific abnormal finding of lung field: Secondary | ICD-10-CM

## 2017-12-18 DIAGNOSIS — I248 Other forms of acute ischemic heart disease: Secondary | ICD-10-CM | POA: Diagnosis not present

## 2017-12-18 DIAGNOSIS — I700 Atherosclerosis of aorta: Secondary | ICD-10-CM | POA: Insufficient documentation

## 2017-12-18 DIAGNOSIS — E232 Diabetes insipidus: Secondary | ICD-10-CM | POA: Diagnosis not present

## 2017-12-18 DIAGNOSIS — J9621 Acute and chronic respiratory failure with hypoxia: Secondary | ICD-10-CM | POA: Diagnosis not present

## 2017-12-18 DIAGNOSIS — R0602 Shortness of breath: Secondary | ICD-10-CM | POA: Diagnosis not present

## 2017-12-18 DIAGNOSIS — J181 Lobar pneumonia, unspecified organism: Secondary | ICD-10-CM | POA: Diagnosis not present

## 2017-12-18 DIAGNOSIS — J9622 Acute and chronic respiratory failure with hypercapnia: Secondary | ICD-10-CM | POA: Diagnosis not present

## 2017-12-18 DIAGNOSIS — C3491 Malignant neoplasm of unspecified part of right bronchus or lung: Secondary | ICD-10-CM | POA: Diagnosis not present

## 2017-12-18 MED ORDER — IOPAMIDOL (ISOVUE-300) INJECTION 61%
60.0000 mL | Freq: Once | INTRAVENOUS | Status: AC | PRN
  Administered 2017-12-18: 60 mL via INTRAVENOUS

## 2017-12-18 NOTE — Telephone Encounter (Signed)
Pt called stating that she had had some chills and her temp had gone up to 100.4. She thinks it is because of the weather. She feels fine now. She has Tylenol that she is taking 3 times a day. Advised her to check her temp before taking the Tylenol and call us back if it starts to climb and if she feel achy or othe symptoms. And she wanted to know about her next appointment coming up for her vitamin B12 shot.  No other c/os or symptoms.  Advised that she could treat this at home. Home care advice given with verbal understanding. Will route this to flow at [facility].  Reason for Disposition . Nursing judgment  Protocols used: NO GUIDELINE OR REFERENCE AVAILABLE-A-AH

## 2017-12-18 NOTE — Telephone Encounter (Signed)
FYI

## 2017-12-19 ENCOUNTER — Other Ambulatory Visit: Payer: Self-pay

## 2017-12-19 ENCOUNTER — Inpatient Hospital Stay
Admission: EM | Admit: 2017-12-19 | Discharge: 2017-12-23 | DRG: 193 | Disposition: A | Discharge status: Home / Self Care | Payer: Medicare Other | Attending: Internal Medicine | Admitting: Internal Medicine

## 2017-12-19 ENCOUNTER — Ambulatory Visit: Payer: Self-pay | Admitting: *Deleted

## 2017-12-19 ENCOUNTER — Emergency Department: Payer: Medicare Other

## 2017-12-19 DIAGNOSIS — E785 Hyperlipidemia, unspecified: Secondary | ICD-10-CM | POA: Diagnosis present

## 2017-12-19 DIAGNOSIS — J9622 Acute and chronic respiratory failure with hypercapnia: Secondary | ICD-10-CM | POA: Diagnosis present

## 2017-12-19 DIAGNOSIS — J181 Lobar pneumonia, unspecified organism: Secondary | ICD-10-CM | POA: Diagnosis present

## 2017-12-19 DIAGNOSIS — Z905 Acquired absence of kidney: Secondary | ICD-10-CM

## 2017-12-19 DIAGNOSIS — Z885 Allergy status to narcotic agent status: Secondary | ICD-10-CM

## 2017-12-19 DIAGNOSIS — R0603 Acute respiratory distress: Secondary | ICD-10-CM | POA: Diagnosis not present

## 2017-12-19 DIAGNOSIS — Z87891 Personal history of nicotine dependence: Secondary | ICD-10-CM | POA: Diagnosis not present

## 2017-12-19 DIAGNOSIS — D638 Anemia in other chronic diseases classified elsewhere: Secondary | ICD-10-CM | POA: Diagnosis present

## 2017-12-19 DIAGNOSIS — Z8249 Family history of ischemic heart disease and other diseases of the circulatory system: Secondary | ICD-10-CM

## 2017-12-19 DIAGNOSIS — I480 Paroxysmal atrial fibrillation: Secondary | ICD-10-CM | POA: Diagnosis present

## 2017-12-19 DIAGNOSIS — I248 Other forms of acute ischemic heart disease: Secondary | ICD-10-CM | POA: Diagnosis present

## 2017-12-19 DIAGNOSIS — Z85528 Personal history of other malignant neoplasm of kidney: Secondary | ICD-10-CM | POA: Diagnosis not present

## 2017-12-19 DIAGNOSIS — M069 Rheumatoid arthritis, unspecified: Secondary | ICD-10-CM | POA: Diagnosis present

## 2017-12-19 DIAGNOSIS — Z79890 Hormone replacement therapy (postmenopausal): Secondary | ICD-10-CM

## 2017-12-19 DIAGNOSIS — R0602 Shortness of breath: Secondary | ICD-10-CM | POA: Diagnosis not present

## 2017-12-19 DIAGNOSIS — Z91041 Radiographic dye allergy status: Secondary | ICD-10-CM

## 2017-12-19 DIAGNOSIS — Z833 Family history of diabetes mellitus: Secondary | ICD-10-CM | POA: Diagnosis not present

## 2017-12-19 DIAGNOSIS — K589 Irritable bowel syndrome without diarrhea: Secondary | ICD-10-CM | POA: Diagnosis present

## 2017-12-19 DIAGNOSIS — I10 Essential (primary) hypertension: Secondary | ICD-10-CM | POA: Diagnosis present

## 2017-12-19 DIAGNOSIS — Z87442 Personal history of urinary calculi: Secondary | ICD-10-CM | POA: Diagnosis not present

## 2017-12-19 DIAGNOSIS — J9621 Acute and chronic respiratory failure with hypoxia: Secondary | ICD-10-CM | POA: Diagnosis present

## 2017-12-19 DIAGNOSIS — C7A8 Other malignant neuroendocrine tumors: Secondary | ICD-10-CM | POA: Diagnosis present

## 2017-12-19 DIAGNOSIS — E232 Diabetes insipidus: Secondary | ICD-10-CM | POA: Diagnosis present

## 2017-12-19 DIAGNOSIS — E039 Hypothyroidism, unspecified: Secondary | ICD-10-CM | POA: Diagnosis present

## 2017-12-19 DIAGNOSIS — K219 Gastro-esophageal reflux disease without esophagitis: Secondary | ICD-10-CM | POA: Diagnosis present

## 2017-12-19 DIAGNOSIS — Z887 Allergy status to serum and vaccine status: Secondary | ICD-10-CM

## 2017-12-19 DIAGNOSIS — Z950 Presence of cardiac pacemaker: Secondary | ICD-10-CM | POA: Diagnosis not present

## 2017-12-19 DIAGNOSIS — Z90710 Acquired absence of both cervix and uterus: Secondary | ICD-10-CM

## 2017-12-19 DIAGNOSIS — Z79899 Other long term (current) drug therapy: Secondary | ICD-10-CM

## 2017-12-19 DIAGNOSIS — J189 Pneumonia, unspecified organism: Secondary | ICD-10-CM | POA: Diagnosis present

## 2017-12-19 DIAGNOSIS — C3490 Malignant neoplasm of unspecified part of unspecified bronchus or lung: Secondary | ICD-10-CM | POA: Diagnosis not present

## 2017-12-19 DIAGNOSIS — J9601 Acute respiratory failure with hypoxia: Secondary | ICD-10-CM | POA: Diagnosis not present

## 2017-12-19 DIAGNOSIS — R748 Abnormal levels of other serum enzymes: Secondary | ICD-10-CM | POA: Diagnosis not present

## 2017-12-19 DIAGNOSIS — Z880 Allergy status to penicillin: Secondary | ICD-10-CM

## 2017-12-19 DIAGNOSIS — F419 Anxiety disorder, unspecified: Secondary | ICD-10-CM | POA: Diagnosis present

## 2017-12-19 DIAGNOSIS — Z803 Family history of malignant neoplasm of breast: Secondary | ICD-10-CM | POA: Diagnosis not present

## 2017-12-19 LAB — CBC WITH DIFFERENTIAL/PLATELET
BASOS ABS: 0 10*3/uL (ref 0–0.1)
Basophils Relative: 0 %
Eosinophils Absolute: 0 10*3/uL (ref 0–0.7)
Eosinophils Relative: 0 %
HCT: 37.4 % (ref 35.0–47.0)
HEMOGLOBIN: 12.1 g/dL (ref 12.0–16.0)
LYMPHS PCT: 14 %
Lymphs Abs: 1.4 10*3/uL (ref 1.0–3.6)
MCH: 30.8 pg (ref 26.0–34.0)
MCHC: 32.3 g/dL (ref 32.0–36.0)
MCV: 95.4 fL (ref 80.0–100.0)
MONOS PCT: 33 %
Monocytes Absolute: 3.2 10*3/uL — ABNORMAL HIGH (ref 0.2–0.9)
NEUTROS ABS: 5.2 10*3/uL (ref 1.4–6.5)
NEUTROS PCT: 53 %
Platelets: 246 10*3/uL (ref 150–440)
RBC: 3.92 MIL/uL (ref 3.80–5.20)
RDW: 17.4 % — ABNORMAL HIGH (ref 11.5–14.5)
SMEAR REVIEW: ADEQUATE
WBC: 9.8 10*3/uL (ref 3.6–11.0)

## 2017-12-19 LAB — BASIC METABOLIC PANEL
ANION GAP: 12 (ref 5–15)
BUN: 10 mg/dL (ref 6–20)
CHLORIDE: 105 mmol/L (ref 101–111)
CO2: 22 mmol/L (ref 22–32)
Calcium: 9.1 mg/dL (ref 8.9–10.3)
Creatinine, Ser: 0.87 mg/dL (ref 0.44–1.00)
GFR calc Af Amer: >60 mL/min (ref >60)
Glucose, Bld: 156 mg/dL — ABNORMAL HIGH (ref 65–99)
POTASSIUM: 4.6 mmol/L (ref 3.5–5.1)
SODIUM: 139 mmol/L (ref 135–145)

## 2017-12-19 LAB — TROPONIN I
TROPONIN I: 0.11 ng/mL — AB (ref <0.03)
TROPONIN I: 0.18 ng/mL — AB (ref <0.03)
TROPONIN I: 0.16 ng/mL — AB (ref <0.03)

## 2017-12-19 LAB — PROTIME-INR
INR: 1.2
PROTHROMBIN TIME: 15.1 s (ref 11.4–15.2)

## 2017-12-19 LAB — GLUCOSE, CAPILLARY: Glucose-Capillary: 95 mg/dL (ref 65–99)

## 2017-12-19 LAB — MRSA PCR SCREENING: MRSA by PCR: NEGATIVE

## 2017-12-19 LAB — MAGNESIUM: Magnesium: 1.8 mg/dL (ref 1.7–2.4)

## 2017-12-19 MED ORDER — METHYLPREDNISOLONE SODIUM SUCC 125 MG IJ SOLR
125.0000 mg | Freq: Once | INTRAMUSCULAR | Status: AC
  Administered 2017-12-19: 125 mg via INTRAVENOUS

## 2017-12-19 MED ORDER — MIRABEGRON ER 50 MG PO TB24
50.0000 mg | ORAL_TABLET | Freq: Every day | ORAL | Status: DC
  Administered 2017-12-19 – 2017-12-23 (×5): 50 mg via ORAL
  Filled 2017-12-19 (×8): qty 1

## 2017-12-19 MED ORDER — MIRTAZAPINE 15 MG PO TABS
15.0000 mg | ORAL_TABLET | Freq: Every day | ORAL | Status: DC
  Administered 2017-12-19 – 2017-12-22 (×4): 15 mg via ORAL
  Filled 2017-12-19 (×4): qty 1

## 2017-12-19 MED ORDER — CEFTRIAXONE SODIUM 1 G IJ SOLR
1.0000 g | INTRAMUSCULAR | Status: DC
  Filled 2017-12-19: qty 10

## 2017-12-19 MED ORDER — LATANOPROST 0.005 % OP SOLN
1.0000 [drp] | Freq: Every day | OPHTHALMIC | Status: DC
  Administered 2017-12-19 – 2017-12-22 (×4): 1 [drp] via OPHTHALMIC
  Filled 2017-12-19: qty 2.5

## 2017-12-19 MED ORDER — FUROSEMIDE 20 MG PO TABS
10.0000 mg | ORAL_TABLET | Freq: Every day | ORAL | Status: DC
  Administered 2017-12-20 – 2017-12-23 (×4): 10 mg via ORAL
  Filled 2017-12-19 (×4): qty 1

## 2017-12-19 MED ORDER — PANTOPRAZOLE SODIUM 40 MG PO TBEC
40.0000 mg | DELAYED_RELEASE_TABLET | Freq: Every day | ORAL | Status: DC
  Administered 2017-12-19 – 2017-12-23 (×5): 40 mg via ORAL
  Filled 2017-12-19 (×5): qty 1

## 2017-12-19 MED ORDER — BRIMONIDINE TARTRATE 0.2 % OP SOLN
1.0000 [drp] | Freq: Two times a day (BID) | OPHTHALMIC | Status: DC
  Administered 2017-12-19 – 2017-12-23 (×8): 1 [drp] via OPHTHALMIC
  Filled 2017-12-19: qty 5

## 2017-12-19 MED ORDER — ALBUTEROL SULFATE (2.5 MG/3ML) 0.083% IN NEBU
5.0000 mg | INHALATION_SOLUTION | Freq: Once | RESPIRATORY_TRACT | Status: AC
  Administered 2017-12-19: 5 mg via RESPIRATORY_TRACT
  Filled 2017-12-19: qty 6

## 2017-12-19 MED ORDER — NITROGLYCERIN 0.4 MG SL SUBL: 0.4000 mg | SUBLINGUAL_TABLET | SUBLINGUAL | Status: DC | PRN

## 2017-12-19 MED ORDER — CALCIUM CARBONATE-VITAMIN D 500-200 MG-UNIT PO TABS
1.0000 | ORAL_TABLET | Freq: Every day | ORAL | Status: DC
  Administered 2017-12-19 – 2017-12-23 (×5): 1 via ORAL
  Filled 2017-12-19 (×5): qty 1

## 2017-12-19 MED ORDER — IPRATROPIUM-ALBUTEROL 0.5-2.5 (3) MG/3ML IN SOLN
3.0000 mL | RESPIRATORY_TRACT | Status: DC
  Administered 2017-12-19 – 2017-12-20 (×5): 3 mL via RESPIRATORY_TRACT
  Filled 2017-12-19 (×5): qty 3

## 2017-12-19 MED ORDER — FERROUS SULFATE 325 (65 FE) MG PO TABS
325.0000 mg | ORAL_TABLET | Freq: Every day | ORAL | Status: DC
  Administered 2017-12-19 – 2017-12-23 (×5): 325 mg via ORAL
  Filled 2017-12-19 (×5): qty 1

## 2017-12-19 MED ORDER — VITAMIN C 500 MG PO TABS
500.0000 mg | ORAL_TABLET | Freq: Every day | ORAL | Status: DC
  Administered 2017-12-19 – 2017-12-23 (×5): 500 mg via ORAL
  Filled 2017-12-19 (×7): qty 1

## 2017-12-19 MED ORDER — LOSARTAN POTASSIUM 50 MG PO TABS
100.0000 mg | ORAL_TABLET | Freq: Every day | ORAL | Status: DC
  Administered 2017-12-19 – 2017-12-23 (×5): 100 mg via ORAL
  Filled 2017-12-19 (×5): qty 2

## 2017-12-19 MED ORDER — METHYLPREDNISOLONE SODIUM SUCC 125 MG IJ SOLR
60.0000 mg | Freq: Four times a day (QID) | INTRAMUSCULAR | Status: DC
  Administered 2017-12-19 – 2017-12-21 (×8): 60 mg via INTRAVENOUS
  Filled 2017-12-19 (×9): qty 2

## 2017-12-19 MED ORDER — CARVEDILOL 3.125 MG PO TABS
6.2500 mg | ORAL_TABLET | Freq: Two times a day (BID) | ORAL | Status: DC
  Administered 2017-12-19 – 2017-12-22 (×7): 6.25 mg via ORAL
  Filled 2017-12-19: qty 1
  Filled 2017-12-19 (×6): qty 2

## 2017-12-19 MED ORDER — HYDROXYCHLOROQUINE SULFATE 200 MG PO TABS
400.0000 mg | ORAL_TABLET | Freq: Every day | ORAL | Status: DC
  Administered 2017-12-19 – 2017-12-23 (×5): 400 mg via ORAL
  Filled 2017-12-19 (×7): qty 2

## 2017-12-19 MED ORDER — VITAMIN D 1000 UNITS PO TABS
1000.0000 [IU] | ORAL_TABLET | Freq: Every day | ORAL | Status: DC
  Administered 2017-12-19 – 2017-12-23 (×5): 1000 [IU] via ORAL
  Filled 2017-12-19 (×6): qty 1

## 2017-12-19 MED ORDER — AZITHROMYCIN 500 MG IV SOLR
500.0000 mg | Freq: Once | INTRAVENOUS | Status: AC
  Administered 2017-12-19: 500 mg via INTRAVENOUS
  Filled 2017-12-19: qty 500

## 2017-12-19 MED ORDER — MAGNESIUM SULFATE 2 GM/50ML IV SOLN
2.0000 g | Freq: Once | INTRAVENOUS | Status: AC
Start: 2017-12-19 — End: 2017-12-19
  Administered 2017-12-19: 2 g via INTRAVENOUS

## 2017-12-19 MED ORDER — TIMOLOL MALEATE 0.5 % OP SOLN
1.0000 [drp] | Freq: Two times a day (BID) | OPHTHALMIC | Status: DC
Start: 2017-12-19 — End: 2017-12-23
  Administered 2017-12-19 – 2017-12-23 (×8): 1 [drp] via OPHTHALMIC
  Filled 2017-12-19: qty 5

## 2017-12-19 MED ORDER — ONDANSETRON HCL 4 MG/2ML IJ SOLN: 4.0000 mg | Freq: Four times a day (QID) | INTRAMUSCULAR | Status: DC | PRN

## 2017-12-19 MED ORDER — ACETAMINOPHEN 650 MG RE SUPP: 650.0000 mg | Freq: Four times a day (QID) | RECTAL | Status: DC | PRN

## 2017-12-19 MED ORDER — DORZOLAMIDE HCL 2 % OP SOLN
1.0000 [drp] | Freq: Two times a day (BID) | OPHTHALMIC | Status: DC
  Administered 2017-12-19 – 2017-12-23 (×8): 1 [drp] via OPHTHALMIC
  Filled 2017-12-19: qty 10

## 2017-12-19 MED ORDER — SENNOSIDES-DOCUSATE SODIUM 8.6-50 MG PO TABS: 1.0000 | ORAL_TABLET | Freq: Every evening | ORAL | Status: DC | PRN

## 2017-12-19 MED ORDER — LEVOTHYROXINE SODIUM 112 MCG PO TABS
112.0000 ug | ORAL_TABLET | ORAL | Status: DC
  Administered 2017-12-20 – 2017-12-23 (×4): 112 ug via ORAL
  Filled 2017-12-19 (×4): qty 1

## 2017-12-19 MED ORDER — ENOXAPARIN SODIUM 40 MG/0.4ML ~~LOC~~ SOLN
40.0000 mg | SUBCUTANEOUS | Status: DC
  Administered 2017-12-19 – 2017-12-22 (×4): 40 mg via SUBCUTANEOUS
  Filled 2017-12-19 (×4): qty 0.4

## 2017-12-19 MED ORDER — ACETAMINOPHEN 325 MG PO TABS
650.0000 mg | ORAL_TABLET | Freq: Four times a day (QID) | ORAL | Status: DC | PRN
  Administered 2017-12-19 – 2017-12-23 (×8): 650 mg via ORAL
  Filled 2017-12-19 (×8): qty 2

## 2017-12-19 MED ORDER — ONDANSETRON HCL 4 MG PO TABS: 4.0000 mg | ORAL_TABLET | Freq: Four times a day (QID) | ORAL | Status: DC | PRN

## 2017-12-19 MED ORDER — LEVOTHYROXINE SODIUM 112 MCG PO TABS
112.0000 ug | ORAL_TABLET | ORAL | Status: DC
  Filled 2017-12-19: qty 1

## 2017-12-19 MED ORDER — SODIUM CHLORIDE 0.9 % IV SOLN
INTRAVENOUS | Status: DC
  Administered 2017-12-19 – 2017-12-21 (×4): via INTRAVENOUS

## 2017-12-19 MED ORDER — SODIUM CHLORIDE 0.9 % IV SOLN
500.0000 mg | INTRAVENOUS | Status: DC
  Administered 2017-12-20: 11:00:00 500 mg via INTRAVENOUS
  Filled 2017-12-19 (×2): qty 500

## 2017-12-19 MED ORDER — MAGNESIUM SULFATE 2 GM/50ML IV SOLN
INTRAVENOUS | Status: AC
  Administered 2017-12-19: 2 g via INTRAVENOUS
  Filled 2017-12-19: qty 50

## 2017-12-19 MED ORDER — METHYLPREDNISOLONE SODIUM SUCC 125 MG IJ SOLR
INTRAMUSCULAR | Status: AC
  Administered 2017-12-19: 125 mg via INTRAVENOUS
  Filled 2017-12-19: qty 2

## 2017-12-19 MED ORDER — SODIUM CHLORIDE 0.9 % IV SOLN
1.0000 g | Freq: Once | INTRAVENOUS | Status: AC
  Administered 2017-12-19: 1 g via INTRAVENOUS
  Filled 2017-12-19: qty 10

## 2017-12-19 NOTE — H&P (Signed)
[facility] [place] NAME: Caroline Nichols    MR#:  [id number]  DATE OF BIRTH:  1940/09/28  DATE OF ADMISSION:  12/19/2017  PRIMARY CARE PHYSICIAN: [name], MD   REQUESTING/REFERRING PHYSICIAN: Dr. Joni Fears  CHIEF COMPLAINT:   This of breath HISTORY OF PRESENT ILLNESS:  Caroline Nichols  is a 77 y.o. female with a known history of diabetes insipidus, essential hypertension, anemia who presents to the emergency room due to shortness of breath.  Patient reports over the past 2 days she has had increasing cough, shortness of breath, dyspnea exertion and chest tightness.  She had outpatient CT scan which showed consolidation of the left lower lung. Patient called EMS this morning due to respiratory distress and shortness of breath.  Her oxygen saturation upon arrival by EMS was in the mid 80s. When she arrived to the emergency room she was given 2 DuoNeb treatments and placed on BiPAP.   PAST MEDICAL HISTORY:   Past Medical History:  Diagnosis Date   Anemia 02/2013   F/U with Dr. Grayland Ormond for Feraheme injections   Anxiety    Arthritis    Possible RA - Follow up appt with Dr. Jefm Bryant   Cellulitis of arm, right    Diabetes insipidus ([place])    On desmopressin   GERD (gastroesophageal reflux disease)    H/O seasonal allergies    H/O transfusion of packed red blood cells 02/2013   3 units PRBC for severe anemia 02/2013   Herniated disc    History of kidney stones    Hyperlipidemia    Hypertension    Hyperthyroidism    s/p radiation therapy, now hypothyroidism   IBS (irritable bowel syndrome)    Migraine    Pacemaker    a. s/p successful implantation of a Medtronic CapSureFix Novus MRI SureScan serial # [id number] on 06/28/16 by Dr. Caryl Comes for sinus node dysfunction.   Pancreatic cyst    Paroxysmal A-fib [facility])    Renal cell cancer, right ([place]) 10/23/2015   Right partial nephrectomy.    PAST SURGICAL HISTORY:   Past  Surgical History:  Procedure Laterality Date   ABDOMINAL HYSTERECTOMY     BREAST EXCISIONAL BIOPSY Right 2005   neg   BREAST SURGERY  1990s   Biopsy   CERVICAL SPINE SURGERY     Bone fusion   COLONOSCOPY  2008?   [place]   COLONOSCOPY WITH PROPOFOL N/A 10/15/2016   Procedure: COLONOSCOPY WITH PROPOFOL;  Surgeon: Robert Bellow, MD;  Location: [facility] ENDOSCOPY;  Service: Endoscopy;  Laterality: N/A;   EP IMPLANTABLE DEVICE N/A 06/28/2016   Procedure: Pacemaker Implant;  Surgeon: Deboraha Sprang, MD;  Location: [place] CV LAB;  Service: Cardiovascular;  Laterality: N/A;   HAMMER TOE SURGERY     HERNIA REPAIR Left    Inguinal Hernia Repair   PORTA CATH INSERTION N/A 07/24/2017   Procedure: PORTA CATH INSERTION;  Surgeon: Algernon Huxley, MD;  Location: [place] CV LAB;  Service: Cardiovascular;  Laterality: N/A;   ROBOTIC ASSITED PARTIAL NEPHRECTOMY Right 10/23/2015   Procedure: ROBOTIC ASSITED PARTIAL NEPHRECTOMY with intraop ultrasound;  Surgeon: Hollice Espy, MD;  Location: [facility] ORS;  Service: Urology;  Laterality: Right;   TUBAL LIGATION      SOCIAL HISTORY:   Social History   Tobacco Use   Smoking status: Former Smoker    Packs/day: 0.50    Years: 30.00    Pack years: 15.00  Types: Cigarettes    Last attempt to quit: 12/03/2012    Years since quitting: 5.0   Smokeless tobacco: Never Used  Substance Use Topics   Alcohol use: No    Alcohol/week: 0.0 oz    FAMILY HISTORY:   Family History  Problem Relation Age of Onset   Heart disease Mother    Heart disease Father    Diabetes Brother    Kidney disease Brother    Stroke Daughter        due to medication reaction   Breast cancer Cousin    Prostate cancer Neg Hx    Hematuria Neg Hx     DRUG ALLERGIES:   Allergies  Allergen Reactions   Metrizamide     Other reaction(s): Other (See Comments)   Adhesive [Tape] Other (See Comments)    "irritate skin"   Augmentin  [Amoxicillin-Pot Clavulanate] Diarrhea    Severe diarrhea   Codeine     REACTION: NAUSEA   Morphine And Related Other (See Comments)    Shut down her organs   Other     Skin irritation   Tetanus Toxoid     REACTION: ARM SWELLING,REDNESS   Tramadol Nausea And Vomiting    REVIEW OF SYSTEMS:   Review of Systems  Constitutional: Positive for fever and malaise/fatigue. Negative for chills.  HENT: Negative.  Negative for ear discharge, ear pain, hearing loss, nosebleeds and sore throat.   Eyes: Negative.  Negative for blurred vision and pain.  Respiratory: Positive for cough and shortness of breath. Negative for hemoptysis and wheezing.   Cardiovascular: Negative.  Negative for chest pain, palpitations and leg swelling.  Gastrointestinal: Negative.  Negative for abdominal pain, blood in stool, diarrhea, nausea and vomiting.  Genitourinary: Negative.  Negative for dysuria.  Musculoskeletal: Negative.  Negative for back pain.  Skin: Negative.   Neurological: Negative for dizziness, tremors, speech change, focal weakness, seizures and headaches.  Endo/Heme/Allergies: Negative.  Does not bruise/bleed easily.  Psychiatric/Behavioral: Negative.  Negative for depression, hallucinations and suicidal ideas.    MEDICATIONS AT HOME:   Prior to Admission medications   Medication Sig Start Date End Date Taking? Authorizing Provider  acetaminophen (TYLENOL) 325 MG tablet Take 2 tablets (650 mg total) by mouth every 6 (six) hours as needed for pain. Patient taking differently: Take 650 mg by mouth every 6 (six) hours as needed for pain.  01/04/13  Yes Hongalgi, Lenis Dickinson, MD  ALPRAZolam (XANAX) 0.25 MG tablet TAKE ONE TABLET BY MOUTH TWICE A DAY AS NEEDED FOR ANXIETY Patient taking differently: Take 0.25 mg by mouth 2 (two) times daily as needed for anxiety.  11/28/17  Yes Einar Pheasant, MD  brimonidine (ALPHAGAN) 0.2 % ophthalmic solution Place 1 drop into both eyes 2 (two) times daily. 10/31/14   Yes [provider]  Calcium Carbonate-Vitamin D (CALCIUM-CARB 600 + D) 600-125 MG-UNIT TABS Take 2 tablets by mouth daily.   Yes [provider]  carvedilol (COREG) 6.25 MG tablet Take 1 tablet (6.25 mg total) 2 (two) times daily by mouth. 06/17/17  Yes Deboraha Sprang, MD  cholecalciferol (VITAMIN D) 1000 units tablet Take 1,000 Units by mouth daily.   Yes [provider]  dorzolamide (TRUSOPT) 2 % ophthalmic solution Place 1 drop into both eyes 2 (two) times daily.    Yes [provider]  ferrous sulfate 325 (65 FE) MG tablet Take 325 mg by mouth daily with breakfast.   Yes [provider]  furosemide (LASIX) 20 MG  tablet Take 0.5 tablets (10 mg total) by mouth daily. 11/19/17  Yes [name], MD  gabapentin (NEURONTIN) 300 MG capsule TAKE ONE CAPSULE BY MOUTH THREE TIMES daily for neuropathy Patient taking differently: Take 300 mg by mouth 3 (three) times daily.  10/16/17  Yes [name], MD  hydroxychloroquine (PLAQUENIL) 200 MG tablet Take 400 mg by mouth daily.  12/11/15  Yes [provider]  latanoprost (XALATAN) 0.005 % ophthalmic solution Place 1 drop into both eyes at bedtime.  01/02/16  Yes [provider]  levothyroxine (SYNTHROID, LEVOTHROID) 112 MCG tablet Take 1 tablet (112 mcg total) by mouth daily with breakfast. 12/10/17  Yes [name], MD  lidocaine-prilocaine (EMLA) cream Apply to affected area once 07/20/17  Yes Lloyd Huger, MD  losartan (COZAAR) 100 MG tablet Take 100 mg by mouth daily.   Yes [provider]  mirabegron ER (MYRBETRIQ) 50 MG TB24 tablet Take 1 tablet (50 mg total) by mouth daily. 12/01/17  Yes Hollice Espy, MD  mirtazapine (REMERON) 15 MG tablet Take 1 tablet (15 mg total) by mouth at bedtime. 12/02/17  Yes Lloyd Huger, MD  nitroGLYCERIN (NITROLINGUAL) 0.4 MG/SPRAY spray Place 1 spray under the tongue every 5 (five) minutes x 3 doses as needed for chest pain.  10/16/17  Yes [name], MD  Omega-3 Fatty Acids (FISH OIL) 1000 MG CAPS Take 1,000 mg by mouth daily.    Yes [provider]  ondansetron (ZOFRAN) 8 MG tablet Take 1 tablet (8 mg total) by mouth 2 (two) times daily as needed for refractory nausea / vomiting. Patient taking differently: Take 8 mg by mouth 2 (two) times daily as needed for refractory nausea / vomiting. For refractory nausea/vomiting 07/20/17  Yes Lloyd Huger, MD  pantoprazole (PROTONIX) 40 MG tablet Take 40 mg by mouth daily.   Yes [provider]  potassium chloride SA (K-DUR,KLOR-CON) 20 MEQ tablet Take 1 tablet (20 mEq total) by mouth daily. 07/20/17  Yes [name], MD  prochlorperazine (COMPAZINE) 10 MG tablet Take 1 tablet (10 mg total) by mouth every 6 (six) hours as needed (Nausea or vomiting). Patient taking differently: Take 10 mg by mouth every 6 (six) hours as needed (Nausea or vomiting). For nausea/vomiting 07/20/17  Yes Lloyd Huger, MD  timolol (TIMOPTIC) 0.5 % ophthalmic solution Place 1 drop into both eyes 2 (two) times daily.    Yes [provider]  vitamin C (ASCORBIC ACID) 500 MG tablet Take 500 mg by mouth daily.   Yes [provider]      VITAL SIGNS:  Blood pressure (!) 117/55, pulse 60, resp. rate (!) 28, height 5\' 2"  (1.575 m), weight 54.4 kg (120 lb), SpO2 100 %.  PHYSICAL EXAMINATION:   Physical Exam  Constitutional: She is oriented to person, place, and time. No distress.  HENT:  Head: Normocephalic.  Eyes: No scleral icterus.  Neck: Normal range of motion. Neck supple. No JVD present. No tracheal deviation present.  Cardiovascular: Normal rate, regular rhythm and normal heart sounds. Exam reveals no gallop and no friction rub.  No murmur heard. Pulmonary/Chest: Effort normal. No respiratory distress. She has decreased breath sounds in the left middle field and the left lower field. She has no wheezes. She has rales in the left middle  field and the left lower field. She exhibits no tenderness.  Abdominal: Soft. Bowel sounds are normal. She exhibits no distension and no mass. There is no tenderness.  There is no rebound and no guarding.  Musculoskeletal: Normal range of motion. She exhibits no edema.  Neurological: She is alert and oriented to person, place, and time.  Skin: Skin is warm. No rash noted. No erythema.  IJ Port-A-Cath  Psychiatric: Judgment normal.      LABORATORY PANEL:   CBC Recent Labs  Lab 12/19/17 0841  WBC 9.8  HGB 12.1  HCT 37.4  PLT 246   ------------------------------------------------------------------------------------------------------------------  Chemistries  Recent Labs  Lab 12/19/17 0841  NA 139  K 4.6  CL 105  CO2 22  GLUCOSE 156*  BUN 10  CREATININE 0.87  CALCIUM 9.1  MG 1.8   ------------------------------------------------------------------------------------------------------------------  Cardiac Enzymes Recent Labs  Lab 12/19/17 0841  TROPONINI 0.11*   ------------------------------------------------------------------------------------------------------------------  RADIOLOGY:  Ct Chest W Contrast  Result Date: 12/18/2017 CLINICAL DATA:  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 Additional history of partial right nephrectomy for renal cell carcinoma. EXAM: CT CHEST WITH CONTRAST TECHNIQUE: Multidetector CT imaging of the chest was performed during intravenous contrast administration. CONTRAST:  45mL ISOVUE-300 IOPAMIDOL (ISOVUE-300) INJECTION 61% COMPARISON:  10/21/2017 PET-CT.  06/12/2017 chest CT. FINDINGS: Cardiovascular: Stable mild cardiomegaly. No significant pericardial effusion/thickening. Right internal jugular MediPort terminates at the cavoatrial junction. Two lead left subclavian ICD is noted with lead tips in the right atrium and right ventricular apex. Left  anterior descending coronary atherosclerosis. Atherosclerotic nonaneurysmal thoracic aorta. Normal caliber pulmonary arteries. No central pulmonary emboli. Mediastinum/Nodes: No discrete thyroid nodules. Unremarkable esophagus. No pathologically enlarged axillary, mediastinal or right hilar lymph nodes. Lungs/Pleura: No pneumothorax. No right pleural effusion. Trace new dependent left pleural effusion. Mild centrilobular and paraseptal emphysema with diffuse bronchial wall thickening. There is new dense perihilar and basilar consolidation throughout the left lower lobe with surrounding patchy ground-glass opacity and with associated occlusion of basilar segmental left lower lobe bronchi. No lung masses or significant pulmonary nodules. Upper abdomen: Simple 2.4 cm medial upper right renal cyst. Musculoskeletal: No aggressive appearing focal osseous lesions. Advanced degenerative disc disease throughout the thoracic spine, which appears worsened since 06/12/2017 chest CT. IMPRESSION: 1. New dense perihilar and basilar consolidation throughout the left lower lung lobe with surrounding patchy ground-glass opacity and associated occlusion of the basilar segmental left lower lobe bronchi. New trace dependent left pleural effusion. Presumably, these findings represent new postradiation changes. Follow-up chest CT is advised. 2. No definite findings of metastatic disease in the chest. 3. Stable mild cardiomegaly.  One vessel coronary atherosclerosis. Aortic Atherosclerosis (ICD10-I70.0) and Emphysema (ICD10-J43.9). Electronically Signed   By: Ilona Sorrel M.D.   On: 12/18/2017 14:14   Dg Chest Portable 1 View  Result Date: 12/19/2017 CLINICAL DATA:  Shortness of breath, dyspnea, oxygen desaturation EXAM: PORTABLE CHEST 1 VIEW COMPARISON:  Portable exam 0910 hours compared to 12/18/2016 FINDINGS: RIGHT jugular Port-A-Cath with tip projecting over SVC. Sequential LEFT subclavian transvenous pacemaker with leads  projecting over RIGHT atrium and RIGHT ventricle. Enlargement of cardiac silhouette with pulmonary vascular congestion. Mediastinal contours normal. Consolidation LEFT lower lobe consistent with pneumonia though aspiration could give a similar appearance. Remaining lungs clear. No pleural effusion or pneumothorax. Bones demineralized. IMPRESSION: Enlargement of cardiac silhouette. LEFT lower lobe consolidation consistent with pneumonia though aspiration could cause a similar appearance. Electronically Signed   By: Lavonia Dana M.D.   On: 12/19/2017 09:23    EKG:  AV dual paced rhythm no ST elevation or depression  IMPRESSION AND PLAN:  77 year old female with history of renal cell cancer, PAF and anemia who presents to the emergency room with shortness of breath.   1.  Acute hypoxic respiratory failure in the setting of community-acquired pneumonia Patient weaned off BiPAP to nasal cannula  2.  Community-acquired pneumonia: Continue ceftriaxone and azithromycin and follow-up on final cultures  3.  Elevated troponin: Continue to monitor troponins This is likely due to demand ischemia  4.  Anemia of chronic disease: Monitor CBC  5.  Essential hypertension: Continue Coreg losartan  6.  Hypothyroidism: Continue Synthroid  7.  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 Grayland Ormond  8.  History of renal cell carcinoma right kidney status post partial nephrectomy  9.  History of rheumatoid arthritis: Continue Plaquenil  Follows with Dr. Jefm Bryant   all the records are reviewed and case discussed with ED provider. Management plans discussed with the patient and she is in agreement  CODE STATUS: FULL  TOTAL TIME TAKING CARE OF THIS PATIENT: 40 minutes.    Levin Dagostino M.D on 12/19/2017 at 10:00 AM  Between 7am to 6pm - Pager - [phone]  After 6pm go to [email or web address] - password [email or web address] [facility]  Office   [phone]  CC: Primary care physician; [name], MD

## 2017-12-19 NOTE — ED Triage Notes (Signed)
Pt arrived via [place] from [place] living with c/o shortness of breath. EMS states that she was having work of breathing and was not able to catch her breath. O2 sat were 93 but dropped to mid 80s upon exertion. EMS states that she became pale on arrival to ED. Pt given two duoneb treatments.

## 2017-12-19 NOTE — Consult Note (Signed)
Reason for Consult: Pneumonia Referring Physician: Hospitalist service  Caroline Nichols is an 77 y.o. female.  HPI: Caroline Nichols is a very pleasant 77 year old African-American female with a long complicated history to include hypertension, hyperlipidemia, secondary hypothyroidism post radiation, paroxysmal atrial fibrillation, status post pacemaker, diabetes, history of renal cancer, status post renal sparing surgery, recently diagnosed left lower lobe lung cancer, stage IIIb neuroendocrine large cell carcinoma, status post 3 treatments of chemotherapy with carboplatinum and etoposide, and has received radiation therapy.  Recent PET scan did not show any evidence of recurrence or metastasis.  He has had a 1 day episode of increasing shortness of breath, cough. Brought into the emergency department required BiPAP and subsequently admitted to the intensive care unit.  CT scan of the chest showed left lower lobe consolidation and airspace disease.  Presently she is resting comfortably on nasal cannula.  Past Medical History:  Diagnosis Date   Anemia 02/2013   F/U with Dr. Grayland Ormond for Feraheme injections   Anxiety    Arthritis    Possible RA - Follow up appt with Dr. Jefm Bryant   Cellulitis of arm, right    Diabetes insipidus ([place])    On desmopressin   GERD (gastroesophageal reflux disease)    H/O seasonal allergies    H/O transfusion of packed red blood cells 02/2013   3 units PRBC for severe anemia 02/2013   Herniated disc    History of kidney stones    Hyperlipidemia    Hypertension    Hyperthyroidism    s/p radiation therapy, now hypothyroidism   IBS (irritable bowel syndrome)    Migraine    Pacemaker    a. s/p successful implantation of a Medtronic CapSureFix Novus MRI SureScan serial # [id number] on 06/28/16 by Dr. Caryl Comes for sinus node dysfunction.   Pancreatic cyst    Paroxysmal A-fib [facility])    Renal cell cancer, right ([place]) 10/23/2015   Right partial nephrectomy.     Past Surgical History:  Procedure Laterality Date   ABDOMINAL HYSTERECTOMY     BREAST EXCISIONAL BIOPSY Right 2005   neg   BREAST SURGERY  1990s   Biopsy   CERVICAL SPINE SURGERY     Bone fusion   COLONOSCOPY  2008?   [place]   COLONOSCOPY WITH PROPOFOL N/A 10/15/2016   Procedure: COLONOSCOPY WITH PROPOFOL;  Surgeon: Robert Bellow, MD;  Location: [facility] ENDOSCOPY;  Service: Endoscopy;  Laterality: N/A;   EP IMPLANTABLE DEVICE N/A 06/28/2016   Procedure: Pacemaker Implant;  Surgeon: Deboraha Sprang, MD;  Location: [place] CV LAB;  Service: Cardiovascular;  Laterality: N/A;   HAMMER TOE SURGERY     HERNIA REPAIR Left    Inguinal Hernia Repair   PORTA CATH INSERTION N/A 07/24/2017   Procedure: PORTA CATH INSERTION;  Surgeon: Algernon Huxley, MD;  Location: [place] CV LAB;  Service: Cardiovascular;  Laterality: N/A;   ROBOTIC ASSITED PARTIAL NEPHRECTOMY Right 10/23/2015   Procedure: ROBOTIC ASSITED PARTIAL NEPHRECTOMY with intraop ultrasound;  Surgeon: Hollice Espy, MD;  Location: [facility] ORS;  Service: Urology;  Laterality: Right;   TUBAL LIGATION      Family History  Problem Relation Age of Onset   Heart disease Mother    Heart disease Father    Diabetes Brother    Kidney disease Brother    Stroke Daughter        due to medication reaction   Breast cancer Cousin    Prostate cancer Neg Hx  Hematuria Neg Hx     Social History:  reports that she quit smoking about 5 years ago. Her smoking use included cigarettes. She has a 15.00 pack-year smoking history. She has never used smokeless tobacco. She reports that she does not drink alcohol or use drugs.  Allergies:  Allergies  Allergen Reactions   Metrizamide     Other reaction(s): Other (See Comments)   Adhesive [Tape] Other (See Comments)    "irritate skin"   Augmentin [Amoxicillin-Pot Clavulanate] Diarrhea    Severe diarrhea   Codeine     REACTION: NAUSEA   Morphine And Related  Other (See Comments)    Shut down her organs   Other     Skin irritation   Tetanus Toxoid     REACTION: ARM SWELLING,REDNESS   Tramadol Nausea And Vomiting    Medications: I have reviewed the patient's current medications.  Results for orders placed or performed during the hospital encounter of 12/19/17 (from the past 48 hour(s))  Basic metabolic panel     Status: Abnormal   Collection Time: 12/19/17  8:41 AM  Result Value Ref Range   Sodium 139 135 - 145 mmol/L   Potassium 4.6 3.5 - 5.1 mmol/L    Comment: HEMOLYSIS AT THIS LEVEL MAY AFFECT RESULT   Chloride 105 101 - 111 mmol/L   CO2 22 22 - 32 mmol/L   Glucose, Bld 156 (H) 65 - 99 mg/dL   BUN 10 6 - 20 mg/dL   Creatinine, Ser 0.87 0.44 - 1.00 mg/dL   Calcium 9.1 8.9 - 10.3 mg/dL   GFR calc non Af Amer >60 >60 mL/min   GFR calc Af Amer >60 >60 mL/min    Comment: (NOTE) The eGFR has been calculated using the CKD EPI equation. This calculation has not been validated in all clinical situations. eGFR's persistently <60 mL/min signify possible Chronic Kidney Disease.    Anion gap 12 5 - 15    Comment: Performed at [facility], [address] [place], [place] [address]  Troponin I     Status: Abnormal   Collection Time: 12/19/17  8:41 AM  Result Value Ref Range   Troponin I 0.11 (HH) <0.03 ng/mL    Comment: CRITICAL RESULT CALLED TO, READ BACK BY AND VERIFIED WITH COLE AMORIELLO 12/19/17 @ [place] Performed at [facility], [address] [place], [place] [address]   CBC with Differential     Status: Abnormal   Collection Time: 12/19/17  8:41 AM  Result Value Ref Range   WBC 9.8 3.6 - 11.0 K/uL   RBC 3.92 3.80 - 5.20 MIL/uL   Hemoglobin 12.1 12.0 - 16.0 g/dL   HCT 37.4 35.0 - 47.0 %   MCV 95.4 80.0 - 100.0 fL   MCH 30.8 26.0 - 34.0 pg   MCHC 32.3 32.0 - 36.0 g/dL   RDW 17.4 (H) 11.5 - 14.5 %   Platelets 246 150 - 440 K/uL   Neutrophils Relative % 53 %   Lymphocytes Relative 14 %    Monocytes Relative 33 %   Eosinophils Relative 0 %   Basophils Relative 0 %   Neutro Abs 5.2 1.4 - 6.5 K/uL   Lymphs Abs 1.4 1.0 - 3.6 K/uL   Monocytes Absolute 3.2 (H) 0.2 - 0.9 K/uL   Eosinophils Absolute 0.0 0 - 0.7 K/uL   Basophils Absolute 0.0 0 - 0.1 K/uL   RBC Morphology POLYCHROMASIA PRESENT     Comment: GIANT PLATELETS  SEEN   Smear Review      PLATELET CLUMPS NOTED ON SMEAR, COUNT APPEARS ADEQUATE    Comment: Performed at [facility], [address] [place], [place] [address]  Magnesium     Status: None   Collection Time: 12/19/17  8:41 AM  Result Value Ref Range   Magnesium 1.8 1.7 - 2.4 mg/dL    Comment: Performed at [facility], [address] [place], [place] [address]  Protime-INR     Status: None   Collection Time: 12/19/17  8:41 AM  Result Value Ref Range   Prothrombin Time 15.1 11.4 - 15.2 seconds   INR 1.20     Comment: Performed at [facility], [address] [place], [place] [address]  Blood gas, venous     Status: Abnormal (Preliminary result)   Collection Time: 12/19/17  8:50 AM  Result Value Ref Range   FIO2 0.40    Delivery systems BILEVEL POSITIVE AIRWAY PRESSURE    pH, Ven 7.22 (L) 7.250 - 7.430   pCO2, Ven 71 (HH) 44.0 - 60.0 mmHg    Comment: CRITICAL RESULT CALLED TO, READ BACK BY AND VERIFIED WITH:  STAFFORD AT 3532, 12/19/2017, LS    pO2, Ven PENDING 32.0 - 45.0 mmHg   Bicarbonate 29.1 (H) 20.0 - 28.0 mmol/L   Acid-Base Excess 0.0 0.0 - 2.0 mmol/L   O2 Saturation PENDING %   Patient temperature 37.0    Collection site VENOUS    Sample type VENOUS     Comment: Performed at [facility], [address] [place], [place] [address]  Glucose, capillary     Status: None   Collection Time: 12/19/17 11:15 AM  Result Value Ref Range   Glucose-Capillary 95 65 - 99 mg/dL    Ct Chest W Contrast  Result Date: 12/18/2017 CLINICAL DATA:  Large-cell neuroendocrine carcinoma of the left lower lung lobe  diagnosed December 2018 status post concurrent chemoradiation therapy. Additional history of partial right nephrectomy for renal cell carcinoma. EXAM: CT CHEST WITH CONTRAST TECHNIQUE: Multidetector CT imaging of the chest was performed during intravenous contrast administration. CONTRAST:  17m ISOVUE-300 IOPAMIDOL (ISOVUE-300) INJECTION 61% COMPARISON:  10/21/2017 PET-CT.  06/12/2017 chest CT. FINDINGS: Cardiovascular: Stable mild cardiomegaly. No significant pericardial effusion/thickening. Right internal jugular MediPort terminates at the cavoatrial junction. Two lead left subclavian ICD is noted with lead tips in the right atrium and right ventricular apex. Left anterior descending coronary atherosclerosis. Atherosclerotic nonaneurysmal thoracic aorta. Normal caliber pulmonary arteries. No central pulmonary emboli. Mediastinum/Nodes: No discrete thyroid nodules. Unremarkable esophagus. No pathologically enlarged axillary, mediastinal or right hilar lymph nodes. Lungs/Pleura: No pneumothorax. No right pleural effusion. Trace new dependent left pleural effusion. Mild centrilobular and paraseptal emphysema with diffuse bronchial wall thickening. There is new dense perihilar and basilar consolidation throughout the left lower lobe with surrounding patchy ground-glass opacity and with associated occlusion of basilar segmental left lower lobe bronchi. No lung masses or significant pulmonary nodules. Upper abdomen: Simple 2.4 cm medial upper right renal cyst. Musculoskeletal: No aggressive appearing focal osseous lesions. Advanced degenerative disc disease throughout the thoracic spine, which appears worsened since 06/12/2017 chest CT. IMPRESSION: 1. New dense perihilar and basilar consolidation throughout the left lower lung lobe with surrounding patchy ground-glass opacity and associated occlusion of the basilar segmental left lower lobe bronchi. New trace dependent left pleural effusion. Presumably, these findings  represent new postradiation changes. Follow-up chest CT is advised. 2. No definite findings of metastatic disease in the  chest. 3. Stable mild cardiomegaly.  One vessel coronary atherosclerosis. Aortic Atherosclerosis (ICD10-I70.0) and Emphysema (ICD10-J43.9). Electronically Signed   By: Ilona Sorrel M.D.   On: 12/18/2017 14:14   Dg Chest Portable 1 View  Result Date: 12/19/2017 CLINICAL DATA:  Shortness of breath, dyspnea, oxygen desaturation EXAM: PORTABLE CHEST 1 VIEW COMPARISON:  Portable exam 0910 hours compared to 12/18/2016 FINDINGS: RIGHT jugular Port-A-Cath with tip projecting over SVC. Sequential LEFT subclavian transvenous pacemaker with leads projecting over RIGHT atrium and RIGHT ventricle. Enlargement of cardiac silhouette with pulmonary vascular congestion. Mediastinal contours normal. Consolidation LEFT lower lobe consistent with pneumonia though aspiration could give a similar appearance. Remaining lungs clear. No pleural effusion or pneumothorax. Bones demineralized. IMPRESSION: Enlargement of cardiac silhouette. LEFT lower lobe consolidation consistent with pneumonia though aspiration could cause a similar appearance. Electronically Signed   By: Lavonia Dana M.D.   On: 12/19/2017 09:23    ROS Blood pressure (!) 158/86, pulse 95, temperature 97.6 F (36.4 C), temperature source Oral, resp. rate (!) 25, height _0  (1.575 m), weight 137 lb 2 oz (62.2 kg), SpO2 99 %. Physical Exam Vital signs: Please see the above list vital signs Patient is awake, alert, in no acute distress HEENT: Trachea is midline, no thyromegaly appreciated, no accessory muscle utilization presently on nasal cannula oxygen Cardiovascular: Regular rate and rhythm Pulmonary: Left lower lobe inspiratory crackles appreciated  Extremities: No clubbing cyanosis or edema noted Neurologic: No focal deficits appreciated Cutaneous: No rashes or lesions noted abdominal: Positive bowel sounds, soft  exam  Assessment/Plan:  Respiratory distress.  Abnormal CT scan of the chest which shows combination of interstitial and alveolar parenchymal opacities in the left lower lung zone, differential diagnosis includes active infection versus radiation/pneumonitis.  Patient has been started on azithromycin and Rocephin along with albuterol and Atrovent. We will empirically place on Solu-Medrol for possible radiation  pneumonitis.  Patient has been successfully weaned off of BiPAP now presently on nasal cannula has significantly improved.  Irasema Chalk 12/19/2017, 11:58 AM

## 2017-12-19 NOTE — Telephone Encounter (Signed)
Pt called with complaints of shortness of breath and wheezing; she is having difficulty speaking in full sentences; nurse triage initiated and recommendations made to include calling 911; spoke with [place], spoke with dispatcher Kaila who dispatched ambulance; confirmed arrival of fire dept with Harriette Ohara; will route to office for notification of this encounter.  Reason for Disposition . Severe difficulty breathing (e.g., struggling for each breath, speaks in single words)  Answer Assessment - Initial Assessment Questions 1. RESPIRATORY STATUS: "Describe your breathing?" (e.g., wheezing, shortness of breath, unable to speak, severe coughing)      Wheezing, short of breath 2. ONSET: "When did this breathing problem begin?"     12/18/17 3. PATTERN "Does the difficult breathing come and go, or has it been constant since it started?"      constant 4. SEVERITY: "How bad is your breathing?" (e.g., mild, moderate, severe)    - MILD: No SOB at rest, mild SOB with walking, speaks normally in sentences, can lay down, no retractions, pulse < 100.    - MODERATE: SOB at rest, SOB with minimal exertion and prefers to sit, cannot lie down flat, speaks in phrases, mild retractions, audible wheezing, pulse 100-120.    - SEVERE: Very SOB at rest, speaks in single words, struggling to breathe, sitting hunched forward, retractions, pulse > 120      severe 5. RECURRENT SYMPTOM: "Have you had difficulty breathing before?" If so, ask: "When was the last time?" and "What happened that time?"      yes 6. CARDIAC HISTORY: "Do you have any history of heart disease?" (e.g., heart attack, angina, bypass surgery, angioplasty)      hypertension 7. LUNG HISTORY: "Do you have any history of lung disease?"  (e.g., pulmonary embolus, asthma, emphysema)     yes 8. CAUSE: "What do you think is causing the breathing problem?"      unsure 9. OTHER SYMPTOMS: "Do you have any other symptoms? (e.g., dizziness,  runny nose, cough, chest pain, fever)     Fever, cough, runny nose 10. PREGNANCY: "Is there any chance you are pregnant?" "When was your last menstrual period?"      no 11. TRAVEL: "Have you traveled out of the country in the last month?" (e.g., travel history, exposures)       no  Protocols used: BREATHING DIFFICULTY-A-AH

## 2017-12-19 NOTE — Telephone Encounter (Signed)
FYI EMS with patient going to ED.

## 2017-12-19 NOTE — ED Provider Notes (Signed)
[facility] Emergency Department Provider Note  ____________________________________________  Time seen: Approximately 9:51 AM  I have reviewed the triage vital signs and the nursing notes.   HISTORY  Chief Complaint Shortness of Breath  Level 5 caveat:  Portions of the history and physical were unable to be obtained due to: acute critical illness  HPI Caroline Nichols is a 77 y.o. female patient brought to the ED with respiratory distress. Reports that she has had a cough and chest tightness for the past 2 days. Had an outpatient CT scan yesterday which showed consolidation of the left lower lung and hazy opacities in the area as well. She denies fever or chills but has had worsening shortness of breath since last night. His morning EMS report that her room air oxygen saturation was 85%, very poor air movement, they started DuoNeb during transport and gave the patient two DuoNeb's prior to arrival.   shortness of breath is constant, severe, worse with walking, no alleviating factors.   Past Medical History:  Diagnosis Date   Anemia 02/2013   F/U with Dr. Grayland Ormond for Feraheme injections   Anxiety    Arthritis    Possible RA - Follow up appt with Dr. Jefm Bryant   Cellulitis of arm, right    Diabetes insipidus ([place])    On desmopressin   GERD (gastroesophageal reflux disease)    H/O seasonal allergies    H/O transfusion of packed red blood cells 02/2013   3 units PRBC for severe anemia 02/2013   Herniated disc    History of kidney stones    Hyperlipidemia    Hypertension    Hyperthyroidism    s/p radiation therapy, now hypothyroidism   IBS (irritable bowel syndrome)    Migraine    Pacemaker    a. s/p successful implantation of a Medtronic CapSureFix Novus MRI SureScan serial # [id number] on 06/28/16 by Dr. Caryl Comes for sinus node dysfunction.   Pancreatic cyst    Paroxysmal A-fib [facility])    Renal cell cancer, right ([place]) 10/23/2015   Right  partial nephrectomy.     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   Special screening for malignant neoplasms, colon 09/11/2016   Encounter for Medicare annual wellness exam 07/21/2016   Vitamin D deficiency 07/21/2016   Pacemaker    Sinus node dysfunction ([place]) 05/23/2016   Fatigue 01/13/2016   History of atrial fibrillation 10/28/2015   Renal cell carcinoma of right kidney ([place]) 10/23/2015   Goals of care, counseling/discussion 10/12/2015   Urinary frequency 07/28/2015   Inflammatory arthritis 05/10/2015   Nocturia 04/16/2015   Hyperlipidemia 12/17/2014   Depression 12/14/2014   Xerostomia 11/14/2014   S/P hysterectomy 06/09/2014   Atherosclerotic peripheral vascular disease with intermittent claudication ([place]) 10/07/2013   Edema of both legs 10/07/2013   Venous stasis dermatitis 10/07/2013   Chest pain 03/26/2013   Left carotid bruit 03/26/2013   Acquired pancytopenia ([place]) 03/20/2013   Bradycardia 12/09/2012   Cervical vertebral fusion 12/09/2012   Gastrointestinal hemorrhage 12/09/2012   Neuropathy 12/09/2012   History of tobacco abuse 12/09/2012   IBS (irritable bowel syndrome)    Degenerative arthritis of lumbar spine    Mild mitral regurgitation 09/27/2009   Postablative hypothyroidism 05/15/2009   Hypokalemia 05/15/2009   Anxiety state 05/15/2009   Essential hypertension 05/15/2009  Past Surgical History:  Procedure Laterality Date   ABDOMINAL HYSTERECTOMY     BREAST EXCISIONAL BIOPSY Right 2005   neg   BREAST SURGERY  1990s   Biopsy   CERVICAL SPINE SURGERY     Bone fusion   COLONOSCOPY  2008?   [place]   COLONOSCOPY WITH PROPOFOL N/A 10/15/2016   Procedure: COLONOSCOPY WITH PROPOFOL;  Surgeon: Robert Bellow, MD;   Location: [facility] ENDOSCOPY;  Service: Endoscopy;  Laterality: N/A;   EP IMPLANTABLE DEVICE N/A 06/28/2016   Procedure: Pacemaker Implant;  Surgeon: Deboraha Sprang, MD;  Location: [place] CV LAB;  Service: Cardiovascular;  Laterality: N/A;   HAMMER TOE SURGERY     HERNIA REPAIR Left    Inguinal Hernia Repair   PORTA CATH INSERTION N/A 07/24/2017   Procedure: PORTA CATH INSERTION;  Surgeon: Algernon Huxley, MD;  Location: [place] CV LAB;  Service: Cardiovascular;  Laterality: N/A;   ROBOTIC ASSITED PARTIAL NEPHRECTOMY Right 10/23/2015   Procedure: ROBOTIC ASSITED PARTIAL NEPHRECTOMY with intraop ultrasound;  Surgeon: Hollice Espy, MD;  Location: [facility] ORS;  Service: Urology;  Laterality: Right;   TUBAL LIGATION       Prior to Admission medications   Medication Sig Start Date End Date Taking? Authorizing Provider  acetaminophen (TYLENOL) 325 MG tablet Take 2 tablets (650 mg total) by mouth every 6 (six) hours as needed for pain. Patient taking differently: Take 650 mg by mouth every 6 (six) hours as needed for pain.  01/04/13  Yes Hongalgi, Lenis Dickinson, MD  ALPRAZolam (XANAX) 0.25 MG tablet TAKE ONE TABLET BY MOUTH TWICE A DAY AS NEEDED FOR ANXIETY Patient taking differently: Take 0.25 mg by mouth 2 (two) times daily as needed for anxiety.  11/28/17  Yes Einar Pheasant, MD  brimonidine (ALPHAGAN) 0.2 % ophthalmic solution Place 1 drop into both eyes 2 (two) times daily. 10/31/14  Yes [provider]  Calcium Carbonate-Vitamin D (CALCIUM-CARB 600 + D) 600-125 MG-UNIT TABS Take 2 tablets by mouth daily.   Yes [provider]  carvedilol (COREG) 6.25 MG tablet Take 1 tablet (6.25 mg total) 2 (two) times daily by mouth. 06/17/17  Yes Deboraha Sprang, MD  cholecalciferol (VITAMIN D) 1000 units tablet Take 1,000 Units by mouth daily.   Yes [provider]  dorzolamide (TRUSOPT) 2 % ophthalmic solution Place 1 drop into both eyes 2 (two) times daily.    Yes [provider]  ferrous sulfate 325 (65 FE) MG tablet Take 325 mg by mouth daily with breakfast.   Yes [provider]  furosemide (LASIX) 20 MG tablet Take 0.5 tablets (10 mg total) by mouth daily. 11/19/17  Yes [name], MD  gabapentin (NEURONTIN) 300 MG capsule TAKE ONE CAPSULE BY MOUTH THREE TIMES daily for neuropathy Patient taking differently: Take 300 mg by mouth 3 (three) times daily.  10/16/17  Yes [name], MD  hydroxychloroquine (PLAQUENIL) 200 MG tablet Take 400 mg by mouth daily.  12/11/15  Yes [provider]  latanoprost (XALATAN) 0.005 % ophthalmic solution Place 1 drop into both eyes at bedtime.  01/02/16  Yes [provider]  levothyroxine (SYNTHROID, LEVOTHROID) 112 MCG tablet Take 1 tablet (112 mcg total) by mouth daily with breakfast. 12/10/17  Yes [name], MD  lidocaine-prilocaine (EMLA) cream Apply to affected area once 07/20/17  Yes Lloyd Huger, MD  losartan (COZAAR) 100 MG tablet Take 100 mg by mouth daily.   Yes [provider]  mirabegron ER (MYRBETRIQ) 50 MG TB24 tablet Take 1 tablet (50 mg total) by mouth daily. 12/01/17  Yes Hollice Espy, MD  mirtazapine (REMERON) 15 MG tablet Take 1 tablet (15 mg total) by mouth at bedtime. 12/02/17  Yes Lloyd Huger, MD  nitroGLYCERIN (NITROLINGUAL) 0.4 MG/SPRAY spray Place 1 spray under the tongue every 5 (five) minutes x 3 doses as needed for chest pain. 10/16/17  Yes [name], MD  Omega-3 Fatty Acids (FISH OIL) 1000 MG CAPS Take 1,000 mg by mouth daily.    Yes [provider]  ondansetron (ZOFRAN) 8 MG tablet Take 1 tablet (8 mg total) by mouth 2 (two) times daily as needed for refractory nausea / vomiting. Patient taking differently: Take 8 mg by mouth 2 (two) times daily as needed for refractory nausea / vomiting. For refractory nausea/vomiting 07/20/17  Yes Lloyd Huger, MD  pantoprazole (PROTONIX) 40 MG tablet Take 40 mg by mouth daily.    Yes [provider]  potassium chloride SA (K-DUR,KLOR-CON) 20 MEQ tablet Take 1 tablet (20 mEq total) by mouth daily. 07/20/17  Yes [name], MD  prochlorperazine (COMPAZINE) 10 MG tablet Take 1 tablet (10 mg total) by mouth every 6 (six) hours as needed (Nausea or vomiting). Patient taking differently: Take 10 mg by mouth every 6 (six) hours as needed (Nausea or vomiting). For nausea/vomiting 07/20/17  Yes Lloyd Huger, MD  timolol (TIMOPTIC) 0.5 % ophthalmic solution Place 1 drop into both eyes 2 (two) times daily.    Yes [provider]  vitamin C (ASCORBIC ACID) 500 MG tablet Take 500 mg by mouth daily.   Yes [provider]     Allergies Metrizamide; Adhesive [tape]; Augmentin [amoxicillin-pot clavulanate]; Codeine; Morphine and related; Other; Tetanus toxoid; and Tramadol   Family History  Problem Relation Age of Onset   Heart disease Mother    Heart disease Father    Diabetes Brother    Kidney disease Brother    Stroke Daughter        due to medication reaction   Breast cancer Cousin    Prostate cancer Neg Hx    Hematuria Neg Hx     Social History Social History   Tobacco Use   Smoking status: Former Smoker    Packs/day: 0.50    Years: 30.00    Pack years: 15.00    Types: Cigarettes    Last attempt to quit: 12/03/2012    Years since quitting: 5.0   Smokeless tobacco: Never Used  Substance Use Topics   Alcohol use: No    Alcohol/week: 0.0 oz   Drug use: No    Review of Systems  Constitutional:   No fever or chills.   Cardiovascular:   positive central chest tightness without syncope. Respiratory:   positive shortness of breath and productive cough. Gastrointestinal:   Negative for abdominal pain, vomiting and diarrhea.  Musculoskeletal:   Negative for focal pain or swelling All other systems reviewed and are negative except as documented above in ROS and  HPI.  ____________________________________________   PHYSICAL EXAM:  VITAL SIGNS: ED Triage Vitals  Enc Vitals Group     BP 12/19/17 0830 (!) 178/85     Pulse Rate 12/19/17 0835 61     Resp 12/19/17 0845 (!) 27     Temp --      Temp Source 12/19/17 0835 Oral     SpO2 12/19/17 0835 98 %     Weight  12/19/17 0832 120 lb (54.4 kg)     Height 12/19/17 0832 5\' 2"  (1.575 m)     Head Circumference --      Peak Flow --      Pain Score 12/19/17 0832 0     Pain Loc --      Pain Edu? --      Excl. in [place]? --     Vital signs reviewed, nursing assessments reviewed.   Constitutional:   Alert and oriented. moderate respiratory distress Eyes:   Conjunctivae are normal. EOMI. PERRL. ENT      Head:   Normocephalic and atraumatic.      Nose:   No congestion/rhinnorhea.       Mouth/Throat:   dry mucous membranes, no pharyngeal erythema. No peritonsillar mass.       Neck:   No meningismus. Full ROM. Hematological/Lymphatic/Immunilogical:   No cervical lymphadenopathy. Cardiovascular:   RRR. Symmetric bilateral radial and DP pulses.  No murmurs.  Respiratory:   increased work of breathing with accessory muscle use, tachypnea with respiratory rate of 40. Breath sounds are symmetric but diminished diffusely. Diffuse expiratory wheezing and prolonged expiratory phase. Gastrointestinal:   Soft and nontender. Non distended. There is no CVA tenderness.  No rebound, rigidity, or guarding.  Musculoskeletal:   Normal range of motion in all extremities. No joint effusions.  No lower extremity tenderness.  No edema. Neurologic:   Normal speech and language.  GCS 15 Motor grossly intact. No acute focal neurologic deficits are appreciated.  Skin:    Skin is warm, dry and intact. No rash noted.  No petechiae, purpura, or bullae.  ____________________________________________    LABS (pertinent positives/negatives) (all labs ordered are listed, but only abnormal results are displayed) Labs Reviewed   BLOOD GAS, VENOUS - Abnormal; Notable for the following components:      Result Value   pH, Ven 7.22 (*)    pCO2, Ven 71 (*)    Bicarbonate 29.1 (*)    All other components within normal limits  BASIC METABOLIC PANEL - Abnormal; Notable for the following components:   Glucose, Bld 156 (*)    All other components within normal limits  TROPONIN I - Abnormal; Notable for the following components:   Troponin I 0.11 (*)    All other components within normal limits  CBC WITH DIFFERENTIAL/PLATELET - Abnormal; Notable for the following components:   RDW 17.4 (*)    All other components within normal limits  MAGNESIUM  PROTIME-INR   ____________________________________________   EKG  interpreted by me AV dual paced rhythm, rate of 60. Normal axis and intervals. Normal QRS and ST segments. Isolated T-wave inversions in aVL and V2, nonspecific. No acute ischemic changes.  ____________________________________________    [id number]  Dg Chest Portable 1 View  Result Date: 12/19/2017 CLINICAL DATA:  Shortness of breath, dyspnea, oxygen desaturation EXAM: PORTABLE CHEST 1 VIEW COMPARISON:  Portable exam 0910 hours compared to 12/18/2016 FINDINGS: RIGHT jugular Port-A-Cath with tip projecting over SVC. Sequential LEFT subclavian transvenous pacemaker with leads projecting over RIGHT atrium and RIGHT ventricle. Enlargement of cardiac silhouette with pulmonary vascular congestion. Mediastinal contours normal. Consolidation LEFT lower lobe consistent with pneumonia though aspiration could give a similar appearance. Remaining lungs clear. No pleural effusion or pneumothorax. Bones demineralized. IMPRESSION: Enlargement of cardiac silhouette. LEFT lower lobe consolidation consistent with pneumonia though aspiration could cause a similar appearance. Electronically Signed   By: Lavonia Dana M.D.   On: 12/19/2017 09:23    ____________________________________________  PROCEDURES .Critical  Care Performed by: Carrie Mew, MD Authorized by: Carrie Mew, MD   Critical care provider statement:    Critical care time (minutes):  35   Critical care time was exclusive of:  Separately billable procedures and treating other patients   Critical care was necessary to treat or prevent imminent or life-threatening deterioration of the following conditions:  Respiratory failure   Critical care was time spent personally by me on the following activities:  Development of treatment plan with patient or surrogate, discussions with consultants, evaluation of patient's response to treatment, examination of patient, obtaining history from patient or surrogate, ordering and performing treatments and interventions, ordering and review of laboratory studies, ordering and review of radiographic studies, pulse oximetry, re-evaluation of patient's condition, review of old charts, blood draw for specimens and ventilator management    ____________________________________________  DIFFERENTIAL DIAGNOSIS   COPD exacerbation, pulmonary edema, pulmonary embolism, non-STEMI, pneumonia  CLINICAL IMPRESSION / ASSESSMENT AND PLAN / ED COURSE  Pertinent labs & imaging results that were available during my care of the patient were reviewed by me and considered in my medical decision making (see chart for details).    patient arrives in respiratory distress, most consistent with a COPD exacerbation. Afebrile, increased work of breathing, does not appear to be septic on arrival. BiPAP initiated immediately on arrival for respiratory support and to hopefully prevent intubation. IV access placed by me under ultrasound visualization to ensure adequate reliable access..  Clinical Course as of Dec 19 999  Fri Dec 19, 2017  0900 VBG shows acute respiratory acidosis, hypercapnea. Continue BIPAP, nebs, mag, steroids. Will need admission. With labs, cxr, will assess differential of pulm edema vs copd vs  pneumonia vs PE vs NSTEMI.    [PS]  0902 Pt not septic appearing on initial assessment. Will give abx for CAP due to exac. Of COPD.    [PS]  [phone] Cxr viewed by me. Clear lungs. Cardiomegaly. No ptx. No pna. Copd vs PE vs NSTEMI current ddx.    [PS]  [id number] radiology report suggests left lower lobe consolidation consistent with pneumonia. This is consistent with CT w IV contrast performed yesterday that shows consolidation and opacity in the area c/w pneumonia. we will continue antibiotics and plan for admission. At this point PE is unlikely. Trop is slightly elevated but likely due to strain and stress of respiratory illness and not indicative of NSTEMI. Will trend troponin, defer further cardiac workup to inpatient team.    [PS]  2353 Pt is feeling improved, more alert and energetic. FIO2 titrated down to 30%, but pt still tachypneic to 40.   [PS]    Clinical Course User Index [PS] Carrie Mew, MD    ----------------------------------------- 10:01 AM on 12/19/2017 -----------------------------------------  Case discussed with the hospitalist.   ____________________________________________   FINAL CLINICAL IMPRESSION(S) / ED DIAGNOSES    Final diagnoses:  Pneumonia of left lower lobe due to infectious organism [facility])  Acute on chronic respiratory failure with hypoxia and hypercapnia [facility])     ED Discharge Orders    None      Portions of this note were generated with dragon dictation software. Dictation errors may occur despite best attempts at proofreading.    Carrie Mew, MD 12/19/17 1001

## 2017-12-19 NOTE — ED Notes (Signed)
Date and time results received: 12/19/17 0934 (use smartphrase ".now" to insert current time)  Test: Troponon Critical Value: Trop: 0.11  Name of Provider Notified: Dr. Joni Fears

## 2017-12-20 LAB — CBC
HCT: 25.2 % — ABNORMAL LOW (ref 35.0–47.0)
HEMOGLOBIN: 8.4 g/dL — AB (ref 12.0–16.0)
MCH: 31 pg (ref 26.0–34.0)
MCHC: 33.4 g/dL (ref 32.0–36.0)
MCV: 92.6 fL (ref 80.0–100.0)
PLATELETS: 150 10*3/uL (ref 150–440)
RBC: 2.72 MIL/uL — ABNORMAL LOW (ref 3.80–5.20)
RDW: 16.6 % — ABNORMAL HIGH (ref 11.5–14.5)
WBC: 7.1 10*3/uL (ref 3.6–11.0)

## 2017-12-20 LAB — BASIC METABOLIC PANEL
ANION GAP: 10 (ref 5–15)
BUN: 11 mg/dL (ref 6–20)
CALCIUM: 8.8 mg/dL — AB (ref 8.9–10.3)
CO2: 24 mmol/L (ref 22–32)
CREATININE: 0.61 mg/dL (ref 0.44–1.00)
Chloride: 106 mmol/L (ref 101–111)
GFR calc Af Amer: >60 mL/min (ref >60)
GLUCOSE: 169 mg/dL — AB (ref 65–99)
Potassium: 4.1 mmol/L (ref 3.5–5.1)
Sodium: 140 mmol/L (ref 135–145)

## 2017-12-20 LAB — TROPONIN I: TROPONIN I: 0.15 ng/mL — AB (ref <0.03)

## 2017-12-20 MED ORDER — ALPRAZOLAM 0.25 MG PO TABS
0.2500 mg | ORAL_TABLET | Freq: Two times a day (BID) | ORAL | Status: DC | PRN
  Administered 2017-12-20 – 2017-12-23 (×6): 0.25 mg via ORAL
  Filled 2017-12-20 (×6): qty 1

## 2017-12-20 MED ORDER — GABAPENTIN 300 MG PO CAPS
300.0000 mg | ORAL_CAPSULE | Freq: Once | ORAL | Status: AC
  Administered 2017-12-20: 300 mg via ORAL
  Filled 2017-12-20: qty 1

## 2017-12-20 MED ORDER — IPRATROPIUM-ALBUTEROL 0.5-2.5 (3) MG/3ML IN SOLN
3.0000 mL | Freq: Four times a day (QID) | RESPIRATORY_TRACT | Status: DC
  Administered 2017-12-20 (×2): 3 mL via RESPIRATORY_TRACT
  Filled 2017-12-20 (×2): qty 3

## 2017-12-20 MED ORDER — DIPHENHYDRAMINE HCL 25 MG PO CAPS
25.0000 mg | ORAL_CAPSULE | Freq: Every evening | ORAL | Status: DC | PRN
  Administered 2017-12-20: 25 mg via ORAL
  Filled 2017-12-20: qty 1

## 2017-12-20 MED ORDER — SODIUM CHLORIDE 0.9 % IV SOLN
1.0000 g | INTRAVENOUS | Status: DC
  Administered 2017-12-20 – 2017-12-22 (×3): 1 g via INTRAVENOUS
  Filled 2017-12-20: qty 1
  Filled 2017-12-20: qty 10
  Filled 2017-12-20: qty 1

## 2017-12-20 MED ORDER — GABAPENTIN 300 MG PO CAPS
300.0000 mg | ORAL_CAPSULE | Freq: Three times a day (TID) | ORAL | Status: DC
  Administered 2017-12-20 – 2017-12-23 (×10): 300 mg via ORAL
  Filled 2017-12-20 (×10): qty 1

## 2017-12-20 MED ORDER — AZITHROMYCIN 500 MG PO TABS: 250.0000 mg | ORAL_TABLET | Freq: Every day | ORAL | Status: DC

## 2017-12-20 MED ORDER — AZITHROMYCIN 500 MG PO TABS
250.0000 mg | ORAL_TABLET | Freq: Every day | ORAL | Status: DC
  Administered 2017-12-21 – 2017-12-23 (×3): 250 mg via ORAL
  Filled 2017-12-20 (×3): qty 1

## 2017-12-20 NOTE — Progress Notes (Signed)
[place] [place] [facility] [place] NAME: Caroline Nichols    MR#:  [id number]  DATE OF BIRTH:  29-Aug-1940  SUBJECTIVE:  patient came in with increasing shortness of breath and cough found to have left lower lobe pneumonia. She is sitting at the edge of the bed eating lunch. Denies any new complaints. Feels better than yesterday  REVIEW OF SYSTEMS:   Review of Systems  Constitutional: Negative for chills, fever and weight loss.  HENT: Negative for ear discharge, ear pain and nosebleeds.   Eyes: Negative for blurred vision, pain and discharge.  Respiratory: Positive for cough and shortness of breath. Negative for sputum production, wheezing and stridor.   Cardiovascular: Negative for chest pain, palpitations, orthopnea and PND.  Gastrointestinal: Negative for abdominal pain, diarrhea, nausea and vomiting.  Genitourinary: Negative for frequency and urgency.  Musculoskeletal: Negative for back pain and joint pain.  Neurological: Positive for weakness. Negative for sensory change, speech change and focal weakness.  Psychiatric/Behavioral: Negative for depression and hallucinations. The patient is not nervous/anxious.    Tolerating Diet:yes Tolerating PT: ambulatory  DRUG ALLERGIES:   Allergies  Allergen Reactions  . Metrizamide     Other reaction(s): Other (See Comments)  . Adhesive [Tape] Other (See Comments)    "irritate skin"  . Augmentin [Amoxicillin-Pot Clavulanate] Diarrhea    Severe diarrhea  . Codeine     REACTION: NAUSEA  . Morphine And Related Other (See Comments)    Shut down her organs  . Other     Skin irritation  . Tetanus Toxoid     REACTION: ARM SWELLING,REDNESS  . Tramadol Nausea And Vomiting    VITALS:  Blood pressure (!) 157/79, pulse 81, temperature 98.2 F (36.8 C), temperature source Oral, resp. rate 18, height 5\' 2"  (1.575 m), weight 61.8 kg (136 lb 3.2 oz), SpO2 98 %.  PHYSICAL EXAMINATION:   Physical  Exam  GENERAL:  77 y.o.-year-old patient lying in the bed with no acute distress.  EYES: Pupils equal, round, reactive to light and accommodation. No scleral icterus. Extraocular muscles intact.  HEENT: Head atraumatic, normocephalic. Oropharynx and nasopharynx clear.  NECK:  Supple, no jugular venous distention. No thyroid enlargement, no tenderness.  LUNGS: Normal breath sounds bilaterally, no wheezing, rales, rhonchi. No use of accessory muscles of respiration.  CARDIOVASCULAR: S1, S2 normal. No murmurs, rubs, or gallops.  ABDOMEN: Soft, nontender, nondistended. Bowel sounds present. No organomegaly or mass.  EXTREMITIES: No cyanosis, clubbing or edema b/l.    NEUROLOGIC: Cranial nerves II through XII are intact. No focal Motor or sensory deficits b/l.   PSYCHIATRIC:  patient is alert and oriented x 3.  SKIN: No obvious rash, lesion, or ulcer.   LABORATORY PANEL:  CBC Recent Labs  Lab 12/20/17 0007  WBC 7.1  HGB 8.4*  HCT 25.2*  PLT 150    Chemistries  Recent Labs  Lab 12/19/17 0841 12/20/17 0007  NA 139 140  K 4.6 4.1  CL 105 106  CO2 22 24  GLUCOSE 156* 169*  BUN 10 11  CREATININE 0.87 0.61  CALCIUM 9.1 8.8*  MG 1.8  --    Cardiac Enzymes Recent Labs  Lab 12/20/17 0007  TROPONINI 0.15*   RADIOLOGY:  Dg Chest Portable 1 View  Result Date: 12/19/2017 CLINICAL DATA:  Shortness of breath, dyspnea, oxygen desaturation EXAM: PORTABLE CHEST 1 VIEW COMPARISON:  Portable exam 0910 hours compared to 12/18/2016 FINDINGS: RIGHT jugular Port-A-Cath with tip projecting over SVC. Sequential  LEFT subclavian transvenous pacemaker with leads projecting over RIGHT atrium and RIGHT ventricle. Enlargement of cardiac silhouette with pulmonary vascular congestion. Mediastinal contours normal. Consolidation LEFT lower lobe consistent with pneumonia though aspiration could give a similar appearance. Remaining lungs clear. No pleural effusion or pneumothorax. Bones demineralized.  IMPRESSION: Enlargement of cardiac silhouette. LEFT lower lobe consolidation consistent with pneumonia though aspiration could cause a similar appearance. Electronically Signed   By: Lavonia Dana M.D.   On: 12/19/2017 09:23   ASSESSMENT AND PLAN:  77 year old female with history of renal cell cancer, PAF and anemia who presents to the emergency room with shortness of breath.   1.  Acute hypoxic respiratory failure in the setting of community-acquired pneumonia Patient weaned off BiPAP to nasal cannula  2.  Community-acquired pneumonia:  -Continue ceftriaxone and azithromycin and follow-up on final cultures  3.  Elevated troponin: Continue to monitor troponins This is likely due to demand ischemia  4.  Anemia of chronic disease: Monitor CBC  5.  Essential hypertension: Continue Coreg losartan  6.  Hypothyroidism: Continue Synthroid  7.  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 Grayland Ormond -she is been getting chemo and radiation therapy  8.  History of renal cell carcinoma right kidney status post partial nephrectomy  9.  History of rheumatoid arthritis: Continue Plaquenil  Follows with Dr. Jefm Bryant     Case discussed with Care Management/Social Worker. Management plans discussed with the patient, family and they are in agreement.  CODE STATUS: full  DVT Prophylaxis: lovenox  TOTAL TIME TAKING CARE OF THIS PATIENT: *25* minutes.  >50% time spent on counselling and coordination of care  POSSIBLE D/C IN *2-3* DAYS, DEPENDING ON CLINICAL CONDITION.  Note: This dictation was prepared with Dragon dictation along with smaller phrase technology. Any transcriptional errors that result from this process are unintentional.  Fritzi Mandes M.D on 12/20/2017 at 1:45 PM  Between 7am to 6pm - Pager - [phone]  After 6pm go to [email or web address] - password [email or web address] [facility]  Office  [phone]  CC: Primary care  physician; [name], MDPatient ID: Caroline Nichols, female   DOB: Dec 04, 1940, 77 y.o.   MRN: [id number]

## 2017-12-20 NOTE — Progress Notes (Signed)
Pt is complaining about not able to sleep because of burning on her legs. Page prime. Doctor Jannifer Franklin at  called 0020 and ordered benadryl 25 mg PRN at bedtime for sleep and gabapentin 300 mg three times daily. Will continue to monitor.

## 2017-12-20 NOTE — Plan of Care (Signed)
  Problem: Education: Goal: Knowledge of General Education information will improve Outcome: Progressing   Problem: Health Behavior/Discharge Planning: Goal: Ability to manage health-related needs will improve Outcome: Progressing   Problem: Clinical Measurements: Goal: Will remain free from infection Outcome: Progressing   Problem: Activity: Goal: Risk for activity intolerance will decrease Outcome: Progressing   Problem: Pain Managment: Goal: General experience of comfort will improve Outcome: Progressing   Problem: Safety: Goal: Ability to remain free from injury will improve Outcome: Progressing

## 2017-12-20 NOTE — Progress Notes (Addendum)
Pt okay to transfer to 1C.  Called report to 1C and talked to Judson Roch. [name] pt daughter was notified that pt was going to tranfer to 1C. Will continue to monitor.  Update 0234. Pt was transported to 1C.

## 2017-12-21 LAB — GLUCOSE, CAPILLARY: GLUCOSE-CAPILLARY: 128 mg/dL — AB (ref 65–99)

## 2017-12-21 MED ORDER — IPRATROPIUM-ALBUTEROL 0.5-2.5 (3) MG/3ML IN SOLN
3.0000 mL | Freq: Three times a day (TID) | RESPIRATORY_TRACT | Status: DC
  Administered 2017-12-21 – 2017-12-23 (×6): 3 mL via RESPIRATORY_TRACT
  Filled 2017-12-21 (×8): qty 3

## 2017-12-21 MED ORDER — METHYLPREDNISOLONE SODIUM SUCC 125 MG IJ SOLR: 60.0000 mg | Freq: Two times a day (BID) | INTRAMUSCULAR | Status: DC

## 2017-12-21 MED ORDER — PREDNISONE 50 MG PO TABS
50.0000 mg | ORAL_TABLET | Freq: Every day | ORAL | Status: DC
Start: 2017-12-22 — End: 2017-12-22
  Administered 2017-12-22: 08:00:00 50 mg via ORAL
  Filled 2017-12-21: qty 1

## 2017-12-21 MED ORDER — IPRATROPIUM-ALBUTEROL 0.5-2.5 (3) MG/3ML IN SOLN
3.0000 mL | Freq: Four times a day (QID) | RESPIRATORY_TRACT | Status: DC | PRN
  Administered 2017-12-21: 3 mL via RESPIRATORY_TRACT

## 2017-12-21 NOTE — Progress Notes (Signed)
[facility] NAME: Caroline Nichols    MR#:  [id number]  DATE OF BIRTH:  [birth year]  SUBJECTIVE:  patient came in with increasing shortness of breath and cough found to have left lower lobe pneumonia. She is sitting at the edge of the bed eating lunch. Denies any new complaints. Feels better.  REVIEW OF SYSTEMS:   Review of Systems  Constitutional: Negative for chills, fever and weight loss.  HENT: Negative for ear discharge, ear pain and nosebleeds.   Eyes: Negative for blurred vision, pain and discharge.  Respiratory: Positive for cough and shortness of breath. Negative for sputum production, wheezing and stridor.   Cardiovascular: Negative for chest pain, palpitations, orthopnea and PND.  Gastrointestinal: Negative for abdominal pain, diarrhea, nausea and vomiting.  Genitourinary: Negative for frequency and urgency.  Musculoskeletal: Negative for back pain and joint pain.  Neurological: Positive for weakness. Negative for sensory change, speech change and focal weakness.  Psychiatric/Behavioral: Negative for depression and hallucinations. The patient is not nervous/anxious.    Tolerating Diet:yes Tolerating PT: ambulatory  DRUG ALLERGIES:   Allergies  Allergen Reactions  . Metrizamide     Other reaction(s): Other (See Comments)  . Adhesive [Tape] Other (See Comments)    "irritate skin"  . Augmentin [Amoxicillin-Pot Clavulanate] Diarrhea    Severe diarrhea  . Codeine     REACTION: NAUSEA  . Morphine And Related Other (See Comments)    Shut down her organs  . Other     Skin irritation  . Tetanus Toxoid     REACTION: ARM SWELLING,REDNESS  . Tramadol Nausea And Vomiting    VITALS:  Blood pressure (!) 181/91, pulse 85, temperature 99.1 F (37.3 C), temperature source Oral, resp. rate 16, height 5\' 2"  (1.575 m), weight 61.9 kg (136 lb 8 oz), SpO2 97 %.  PHYSICAL EXAMINATION:   Physical Exam  GENERAL:  77  y.o.-year-old patient lying in the bed with no acute distress.  EYES: Pupils equal, round, reactive to light and accommodation. No scleral icterus. Extraocular muscles intact.  HEENT: Head atraumatic, normocephalic. Oropharynx and nasopharynx clear.  NECK:  Supple, no jugular venous distention. No thyroid enlargement, no tenderness.  LUNGS: Normal breath sounds bilaterally, no wheezing, rales, rhonchi. No use of accessory muscles of respiration.  CARDIOVASCULAR: S1, S2 normal. No murmurs, rubs, or gallops.  ABDOMEN: Soft, nontender, nondistended. Bowel sounds present. No organomegaly or mass.  EXTREMITIES: No cyanosis, clubbing or edema b/l.    NEUROLOGIC: Cranial nerves II through XII are intact. No focal Motor or sensory deficits b/l.   PSYCHIATRIC:  patient is alert and oriented x 3.  SKIN: No obvious rash, lesion, or ulcer.   LABORATORY PANEL:  CBC Recent Labs  Lab 12/20/17 0007  WBC 7.1  HGB 8.4*  HCT 25.2*  PLT 150    Chemistries  Recent Labs  Lab 12/19/17 0841 12/20/17 0007  NA 139 140  K 4.6 4.1  CL 105 106  CO2 22 24  GLUCOSE 156* 169*  BUN 10 11  CREATININE 0.87 0.61  CALCIUM 9.1 8.8*  MG 1.8  --    Cardiac Enzymes Recent Labs  Lab 12/20/17 0007  TROPONINI 0.15*   RADIOLOGY:  No results found. ASSESSMENT AND PLAN:  77 year old female with history of renal cell cancer, PAF and anemia who presents to the emergency room with shortness of breath.   1.  Acute hypoxic respiratory failure in the setting of community-acquired pneumonia Patient weaned  off BiPAP to nasal cannula -wean [place] to rA today  2.  Community-acquired pneumonia:  -Continue ceftriaxone and azithromycin  -MRSA pCR negative  3.  Elevated troponin: Continue to monitor troponins This is likely due to demand ischemia  4.  Anemia of chronic disease:  -stable -pt's hgb 8.0-105  5.  Essential hypertension: Continue Coreg losartan  6.  Hypothyroidism: Continue Synthroid  7.   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 Grayland Ormond -she is been getting chemo and radiation therapy  8.  History of renal cell carcinoma right kidney status post partial nephrectomy  9.  History of rheumatoid arthritis: Continue Plaquenil  Follows with Dr. Jefm Bryant   Overall proving. Patient will discharged to home tomorrow if continues to show improvement and stay stable. THis was discussed with patient's daughter Champ Mungo.  Case discussed with Care Management/Social Worker. Management plans discussed with the patient, family and they are in agreement.  CODE STATUS: full  DVT Prophylaxis: lovenox  TOTAL TIME TAKING CARE OF THIS PATIENT: *25* minutes.  >50% time spent on counselling and coordination of care  POSSIBLE D/C IN 1 DAY, DEPENDING ON CLINICAL CONDITION.  Note: This dictation was prepared with Dragon dictation along with smaller phrase technology. Any transcriptional errors that result from this process are unintentional.  Fritzi Mandes M.D on 12/21/2017 at 9:54 AM  Between 7am to 6pm - Pager - [phone]  After 6pm go to [email or web address] - password [email or web address] [facility]  Office  [phone]  CC: Primary care physician; [name], MDPatient ID: Caroline Nichols, female   DOB: 12/04/1940, 77 y.o.   MRN: [id number]

## 2017-12-21 NOTE — Plan of Care (Signed)
  Problem: Education: Goal: Knowledge of General Education information will improve 12/21/2017 1409 by Herbie Baltimore, RN Outcome: Progressing 12/21/2017 1409 by Herbie Baltimore, RN Outcome: Progressing   Problem: Health Behavior/Discharge Planning: Goal: Ability to manage health-related needs will improve 12/21/2017 1409 by Herbie Baltimore, RN Outcome: Progressing 12/21/2017 1409 by Herbie Baltimore, RN Outcome: Progressing   Problem: Clinical Measurements: Goal: Ability to maintain clinical measurements within normal limits will improve 12/21/2017 1409 by Herbie Baltimore, RN Outcome: Progressing 12/21/2017 1409 by Herbie Baltimore, RN Outcome: Progressing Goal: Will remain free from infection 12/21/2017 1409 by Herbie Baltimore, RN Outcome: Progressing 12/21/2017 1409 by Herbie Baltimore, RN Outcome: Progressing Goal: Diagnostic test results will improve 12/21/2017 1409 by Herbie Baltimore, RN Outcome: Progressing 12/21/2017 1409 by Herbie Baltimore, RN Outcome: Progressing Goal: Respiratory complications will improve 12/21/2017 1409 by Herbie Baltimore, RN Outcome: Progressing 12/21/2017 1409 by Herbie Baltimore, RN Outcome: Progressing Goal: Cardiovascular complication will be avoided 12/21/2017 1409 by Herbie Baltimore, RN Outcome: Progressing 12/21/2017 1409 by Herbie Baltimore, RN Outcome: Progressing   Problem: Activity: Goal: Risk for activity intolerance will decrease 12/21/2017 1409 by Herbie Baltimore, RN Outcome: Progressing 12/21/2017 1409 by Herbie Baltimore, RN Outcome: Progressing   Problem: Nutrition: Goal: Adequate nutrition will be maintained 12/21/2017 1409 by Herbie Baltimore, RN Outcome: Progressing 12/21/2017 1409 by Herbie Baltimore, RN Outcome: Progressing   Problem: Coping: Goal: Level of anxiety will decrease 12/21/2017 1409 by Herbie Baltimore, RN Outcome:  Progressing 12/21/2017 1409 by Herbie Baltimore, RN Outcome: Progressing   Problem: Elimination: Goal: Will not experience complications related to bowel motility 12/21/2017 1409 by Herbie Baltimore, RN Outcome: Progressing 12/21/2017 1409 by Herbie Baltimore, RN Outcome: Progressing Goal: Will not experience complications related to urinary retention 12/21/2017 1409 by Herbie Baltimore, RN Outcome: Progressing 12/21/2017 1409 by Herbie Baltimore, RN Outcome: Progressing   Problem: Pain Managment: Goal: General experience of comfort will improve 12/21/2017 1409 by Herbie Baltimore, RN Outcome: Progressing 12/21/2017 1409 by Herbie Baltimore, RN Outcome: Progressing   Problem: Safety: Goal: Ability to remain free from injury will improve 12/21/2017 1409 by Herbie Baltimore, RN Outcome: Progressing 12/21/2017 1409 by Herbie Baltimore, RN Outcome: Progressing   Problem: Skin Integrity: Goal: Risk for impaired skin integrity will decrease 12/21/2017 1409 by Herbie Baltimore, RN Outcome: Progressing 12/21/2017 1409 by Herbie Baltimore, RN Outcome: Progressing   Problem: Activity: Goal: Ability to tolerate increased activity will improve 12/21/2017 1409 by Herbie Baltimore, RN Outcome: Progressing 12/21/2017 1409 by Herbie Baltimore, RN Outcome: Progressing   Problem: Respiratory: Goal: Ability to maintain adequate ventilation will improve 12/21/2017 1409 by Herbie Baltimore, RN Outcome: Progressing 12/21/2017 1409 by Herbie Baltimore, RN Outcome: Progressing

## 2017-12-21 NOTE — Progress Notes (Signed)
Patient weaned off 02, 97% on RA. Patient tolerating, no complaints of SOB.

## 2017-12-21 NOTE — Plan of Care (Signed)
  Problem: Education: Goal: Knowledge of General Education information will improve Outcome: Progressing   Problem: Health Behavior/Discharge Planning: Goal: Ability to manage health-related needs will improve Outcome: Progressing   Problem: Clinical Measurements: Goal: Ability to maintain clinical measurements within normal limits will improve Outcome: Progressing Goal: Will remain free from infection Outcome: Progressing Goal: Diagnostic test results will improve Outcome: Progressing Goal: Respiratory complications will improve Outcome: Progressing Goal: Cardiovascular complication will be avoided Outcome: Progressing   Problem: Activity: Goal: Risk for activity intolerance will decrease Outcome: Progressing   Problem: Nutrition: Goal: Adequate nutrition will be maintained Outcome: Progressing   Problem: Coping: Goal: Level of anxiety will decrease Outcome: Progressing   Problem: Elimination: Goal: Will not experience complications related to bowel motility Outcome: Progressing Goal: Will not experience complications related to urinary retention Outcome: Progressing   Problem: Pain Managment: Goal: General experience of comfort will improve Outcome: Progressing   Problem: Safety: Goal: Ability to remain free from injury will improve Outcome: Progressing   Problem: Skin Integrity: Goal: Risk for impaired skin integrity will decrease Outcome: Progressing   Problem: Skin Integrity: Goal: Risk for impaired skin integrity will decrease Outcome: Progressing   Problem: Activity: Goal: Ability to tolerate increased activity will improve Outcome: Progressing   Problem: Respiratory: Goal: Ability to maintain adequate ventilation will improve Outcome: Progressing

## 2017-12-22 LAB — GLUCOSE, CAPILLARY
Glucose-Capillary: 103 mg/dL — ABNORMAL HIGH (ref 65–99)
Glucose-Capillary: 145 mg/dL — ABNORMAL HIGH (ref 65–99)

## 2017-12-22 MED ORDER — CEFDINIR 300 MG PO CAPS
300.0000 mg | ORAL_CAPSULE | Freq: Two times a day (BID) | ORAL | Status: DC
  Administered 2017-12-22 – 2017-12-23 (×3): 300 mg via ORAL
  Filled 2017-12-22 (×4): qty 1

## 2017-12-22 MED ORDER — LOPERAMIDE HCL 2 MG PO CAPS
2.0000 mg | ORAL_CAPSULE | ORAL | Status: DC | PRN
Start: 2017-12-22 — End: 2017-12-23
  Administered 2017-12-22 (×2): 2 mg via ORAL
  Filled 2017-12-22 (×2): qty 1

## 2017-12-22 NOTE — Progress Notes (Signed)
[facility] at [place] NAME: Caroline Nichols    MR#:  [id number]  DATE OF BIRTH:  November 18, 1940  SUBJECTIVE:  Overall improving. Some loose stools Feels weak. Does not want to leave today  REVIEW OF SYSTEMS:   Review of Systems  Constitutional: Negative for chills, fever and weight loss.  HENT: Negative for ear discharge, ear pain and nosebleeds.   Eyes: Negative for blurred vision, pain and discharge.  Respiratory: Positive for cough and shortness of breath. Negative for sputum production, wheezing and stridor.   Cardiovascular: Negative for chest pain, palpitations, orthopnea and PND.  Gastrointestinal: Negative for abdominal pain, diarrhea, nausea and vomiting.  Genitourinary: Negative for frequency and urgency.  Musculoskeletal: Negative for back pain and joint pain.  Neurological: Positive for weakness. Negative for sensory change, speech change and focal weakness.  Psychiatric/Behavioral: Negative for depression and hallucinations. The patient is not nervous/anxious.    Tolerating Diet:yes Tolerating PT: ambulatory  DRUG ALLERGIES:   Allergies  Allergen Reactions  . Metrizamide     Other reaction(s): Other (See Comments)  . Adhesive [Tape] Other (See Comments)    "irritate skin"  . Augmentin [Amoxicillin-Pot Clavulanate] Diarrhea    Severe diarrhea  . Codeine     REACTION: NAUSEA  . Morphine And Related Other (See Comments)    Shut down her organs  . Other     Skin irritation  . Tetanus Toxoid     REACTION: ARM SWELLING,REDNESS  . Tramadol Nausea And Vomiting    VITALS:  Blood pressure (!) 163/95, pulse 81, temperature 98.9 F (37.2 C), temperature source Oral, resp. rate 15, height 5\' 2"  (1.575 m), weight 61.9 kg (136 lb 8 oz), SpO2 93 %.  PHYSICAL EXAMINATION:   Physical Exam  GENERAL:  77 y.o.-year-old patient lying in the bed with no acute distress.  EYES: Pupils equal, round, reactive to light and  accommodation. No scleral icterus. Extraocular muscles intact.  HEENT: Head atraumatic, normocephalic. Oropharynx and nasopharynx clear.  NECK:  Supple, no jugular venous distention. No thyroid enlargement, no tenderness.  LUNGS: Normal breath sounds bilaterally, no wheezing, rales, rhonchi. No use of accessory muscles of respiration.  CARDIOVASCULAR: S1, S2 normal. No murmurs, rubs, or gallops.  ABDOMEN: Soft, nontender, nondistended. Bowel sounds present. No organomegaly or mass.  EXTREMITIES: No cyanosis, clubbing or edema b/l.    NEUROLOGIC: Cranial nerves II through XII are intact. No focal Motor or sensory deficits b/l.   PSYCHIATRIC:  patient is alert and oriented x 3.  SKIN: No obvious rash, lesion, or ulcer.   LABORATORY PANEL:  CBC Recent Labs  Lab 12/20/17 0007  WBC 7.1  HGB 8.4*  HCT 25.2*  PLT 150    Chemistries  Recent Labs  Lab 12/19/17 0841 12/20/17 0007  NA 139 140  K 4.6 4.1  CL 105 106  CO2 22 24  GLUCOSE 156* 169*  BUN 10 11  CREATININE 0.87 0.61  CALCIUM 9.1 8.8*  MG 1.8  --    Cardiac Enzymes Recent Labs  Lab 12/20/17 0007  TROPONINI 0.15*   RADIOLOGY:  No results found. ASSESSMENT AND PLAN:  77 year old female with history of renal cell cancer, PAF and anemia who presents to the emergency room with shortness of breath.   1.  Acute hypoxic respiratory failure in the setting of community-acquired pneumonia Patient weaned off BiPAP to nasal cannula -weaned to RA. Stas 93% -no need for po prednisone  2.  Community-acquired pneumonia:  -  Continue ceftriaxone and azithromycin --change to oral abxs -MRSA PCR negative  3. Elevated troponin: Continue to monitor troponins This is likely due to demand ischemia  4.  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 Grayland Ormond -she is been getting chemo and radiation therapy  8.History of renal cell carcinoma right kidney status post partial nephrectomy  9.History of rheumatoid arthritis:  Continue Plaquenil. Follows with Dr. Jefm Bryant   Overall improving. Pt feels weak. She does not want to go to her independent facility today. PT will see her. Pt is ambulating in the room and to BR with her walker CM for d/c planning.  Case discussed with Care Management/Social Worker. Management plans discussed with the patient, family and they are in agreement.  CODE STATUS: full  DVT Prophylaxis: lovenox  TOTAL TIME TAKING CARE OF THIS PATIENT: *25* minutes.  >50% time spent on counselling and coordination of care  POSSIBLE D/C IN 1 DAY, DEPENDING ON CLINICAL CONDITION.  Note: This dictation was prepared with Dragon dictation along with smaller phrase technology. Any transcriptional errors that result from this process are unintentional.  Fritzi Mandes M.D on 12/22/2017 at 10:11 AM  Between 7am to 6pm - Pager - [phone]  After 6pm go to [email or web address] - password [email or web address] [facility]  Office  [phone]  CC: Primary care physician; Caroline Nichols, MDPatient ID: Caroline Nichols, female   DOB: [birth year], 77 y.o.   MRN: [id number]

## 2017-12-22 NOTE — Evaluation (Signed)
Physical Therapy Evaluation Patient Details Name: Caroline Nichols MRN: [id number] DOB: November 13, 1940 Today's Date: 12/22/2017   History of Present Illness  presented to ER secondary to progressive SOB, cough; admitted with acute hypoxic respiratory failure related to [facility] PNA.   Clinical Impression  Upon evaluation, patient alert and oriented; follows all commands and demonstrates fair/good insight into safety needs.  Bilat UE/LE strength and ROM grossly symmetrical and WFL for basic transfers and mobility. No focal weakness, pain reported.  Able to complete sit/stand, basic tranfsers and gait (120') with RW, cga/close sup. Slow and deliberate cadence and overall mobility performance, but no overt buckling or LOB. Patient self-limiting distance due to fatigue; sats/vitals remain stable and WFL (>92%) on RA throughout session. Would benefit from skilled PT to address above deficits and promote optimal return to PLOF; Recommend transition to [place] upon discharge from acute hospitalization.     Follow Up Recommendations Home health PT    Equipment Recommendations       Recommendations for Other Services       Precautions / Restrictions Precautions Precautions: Fall;ICD/Pacemaker Restrictions Weight Bearing Restrictions: No      Mobility  Bed Mobility               General bed mobility comments: in bathroom upon arrival to room; in recliner end of treatment session  Transfers Overall transfer level: Needs assistance Equipment used: Rolling walker (2 wheeled) Transfers: Sit to/from Stand Sit to Stand: Min guard         General transfer comment: tends to pull on RW with movement transitions  Ambulation/Gait Ambulation/Gait assistance: Min guard Ambulation Distance (Feet): 120 Feet Assistive device: Rolling walker (2 wheeled)       General Gait Details: reciprocal stepping pattern with fair step height, length; forward flexed posture with slow, deliberate cadence.  No  overt buckling or LOB.  Stairs            Wheelchair Mobility    Modified Rankin (Stroke Patients Only)       Balance Overall balance assessment: Needs assistance Sitting-balance support: No upper extremity supported;Feet supported Sitting balance-Leahy Scale: Good Sitting balance - Comments: sitting functional reach at least 4"; increased ability evident, but patient refuses attempt   Standing balance support: Bilateral upper extremity supported Standing balance-Leahy Scale: Fair                               Pertinent Vitals/Pain Pain Assessment: No/denies pain    Home Living Family/patient expects to be discharged to:: ([place]) Living Arrangements: Alone               Additional Comments: Single-level apartment with elevator access    Prior Function Level of Independence: Independent with assistive device(s)         Comments: Mod indep with 4WRW for ADLs, household and limited community distances (can go to/from dining hall when needed).  No home O2.     Hand Dominance        Extremity/Trunk Assessment   Upper Extremity Assessment Upper Extremity Assessment: Overall WFL for tasks assessed    Lower Extremity Assessment Lower Extremity Assessment: Overall WFL for tasks assessed(grossly at least 4/5 throughout)       Communication   Communication: No difficulties  Cognition Arousal/Alertness: Awake/alert Behavior During Therapy: Flat affect Overall Cognitive Status: Within Functional Limits for tasks assessed  General Comments      Exercises Other Exercises Other Exercises: Toilet transfer, ambulatory with RW, close sup; sit/stand from standard toilet height, close sup; standing balance at sink for hand hygiene, close sup.   Good awaress of limits of stability and safety needs.   Assessment/Plan    PT Assessment Patient needs continued PT  services  PT Problem List Decreased strength;Decreased activity tolerance;Decreased balance;Decreased mobility       PT Treatment Interventions DME instruction;Gait training;Functional mobility training;Therapeutic activities;Therapeutic exercise;Balance training;Patient/family education    PT Goals (Current goals can be found in the Care Plan section)  Acute Rehab PT Goals Patient Stated Goal: to go to rehab and get my strength up before I go home PT Goal Formulation: With patient Time For Goal Achievement: 01/05/18 Potential to Achieve Goals: Good    Frequency Min 2X/week   Barriers to discharge Decreased caregiver support      Co-evaluation               AM-PAC PT "6 Clicks" Daily Activity  Outcome Measure Difficulty turning over in bed (including adjusting bedclothes, sheets and blankets)?: None Difficulty moving from lying on back to sitting on the side of the bed? : None Difficulty sitting down on and standing up from a chair with arms (e.g., wheelchair, bedside commode, etc,.)?: Unable Help needed moving to and from a bed to chair (including a wheelchair)?: A Little Help needed walking in hospital room?: A Little Help needed climbing 3-5 steps with a railing? : A Little 6 Click Score: 18    End of Session Equipment Utilized During Treatment: Gait belt Activity Tolerance: Patient tolerated treatment well Patient left: in chair;with call bell/phone within reach;with chair alarm set Nurse Communication: Mobility status PT Visit Diagnosis: Difficulty in walking, not elsewhere classified (R26.2);Muscle weakness (generalized) (M62.81)    Time: 1032-1100 PT Time Calculation (min) (ACUTE ONLY): 28 min   Charges:   PT Evaluation $PT Eval Low Complexity: 1 Low PT Treatments $Therapeutic Activity: 8-22 mins   PT G Codes:        Tynisa Vohs H. Owens Shark, PT, DPT, NCS 12/22/17, 11:17 AM [phone]

## 2017-12-22 NOTE — Care Management Note (Signed)
Case Management Note  Patient Details  Name: Caroline Nichols MRN: [id number] Date of Birth: 09-20-1940  Subjective/Objective:                  Admitted to [place] walker in her apartment. egional with the diagnosis of pneumonia. Lives at [facility] x 5 years. Seen Dr. Derrel Nip 12/16/17. Prescriptions are filled at [facility]. Daughter is Champ Mungo [phone]). Rolling walker in her apartment. Home Health? Skilled Nursing? Takes care of all her basic activities of daily living herself. No falls. Fair-good appetite.   Action/Plan: Physical therapy evaluation completed. Recommending home with home health/physical therapy.  Discussed these recommendations. Declining these services at this time. Declining [place] at this time.    Expected Discharge Date:                  Expected Discharge Plan:     In-House Referral:     Discharge planning Services     Post Acute Care Choice:    Choice offered to:     DME Arranged:    DME Agency:     HH Arranged:    HH Agency:     Status of Service:     If discussed at [employer], dates discussed:    Additional Comments:  Shelbie Ammons, RN MSN CCM Care Management [phone] 12/22/2017, 1:54 PM

## 2017-12-22 NOTE — Progress Notes (Signed)
Dr Posey Pronto made aware that night shift reported pt with loose stools, new order for imodium

## 2017-12-22 NOTE — Clinical Social Work Note (Signed)
Patient's daughters called nurse today requesting to speak with CSW. CSW spoke with patient's daughters Lonell Face [phone] and Rosanna Randy [phone]. Daughters expressed concerns about patient returning home [place]) Sobieski reviewed chart and found that RN CM has already met with patient and tried to set up home health per therapy recommendation. Per RN CM note, patient is refusing home health. CSW relayed this information to patient's daughters. Daughters asked if patient could go to a SNF. CSW explained that she would have to pay out of pocket for SNF because therapy recommendation is home with home health and patient is walking 120 feet with therapy, therefore insurance would not cover her stay in SNF. Patient's daughters explained that patient is very private and she may have refused services because she is so private. Daughters asked if they could insist she get home health. CSW explained that patient can not be made or forced to have home health, but they could try to encourage her to receive services in the home. CSW also stated that if patient refuses services then we can not go against her wishes. Patient's daughter asked if patient's primary care physician (PCP) could write orders for SNF. CSW explained that her PCP could not write orders while she was in the hospital but once discharged they could talk with the PCP about that. Daughters state that they will try to encourage patient to accept home health services. CSW will notify RN CM about above conversation. CSW also provided daughters with CSW and RN CM phone numbers.   Annamaria Boots MSW, [place] [phone]

## 2017-12-22 NOTE — Care Management Important Message (Signed)
Important Message  Patient Details  Name: Caroline Nichols MRN: [id number] Date of Birth: Feb 21, 1941   Medicare Important Message Given:  Yes    Juliann Pulse A Aristide Waggle 12/22/2017, 11:12 AM

## 2017-12-23 ENCOUNTER — Ambulatory Visit: Payer: Medicare Other

## 2017-12-23 MED ORDER — ALBUTEROL SULFATE HFA 108 (90 BASE) MCG/ACT IN AERS: 2.0000 | INHALATION_SPRAY | Freq: Four times a day (QID) | RESPIRATORY_TRACT | 2 refills | Status: AC | PRN

## 2017-12-23 MED ORDER — CARVEDILOL 12.5 MG PO TABS: 12.5000 mg | ORAL_TABLET | Freq: Two times a day (BID) | ORAL | 1 refills | Status: DC

## 2017-12-23 MED ORDER — AZITHROMYCIN 250 MG PO TABS: 250.0000 mg | ORAL_TABLET | Freq: Every day | ORAL | 0 refills | Status: DC

## 2017-12-23 MED ORDER — CEFDINIR 300 MG PO CAPS: 300.0000 mg | ORAL_CAPSULE | Freq: Two times a day (BID) | ORAL | 0 refills | Status: DC

## 2017-12-23 MED ORDER — CARVEDILOL 3.125 MG PO TABS
12.5000 mg | ORAL_TABLET | Freq: Two times a day (BID) | ORAL | Status: DC
  Administered 2017-12-23: 09:00:00 12.5 mg via ORAL
  Filled 2017-12-23: qty 4

## 2017-12-23 NOTE — Discharge Instructions (Signed)
Keep up your appointment at the Cancer center

## 2017-12-23 NOTE — Discharge Summary (Addendum)
Caroline Nichols at [place] NAME: Caroline Nichols    MR#:  [id number]  DATE OF BIRTH:  09/06/1940  DATE OF ADMISSION:  12/19/2017 ADMITTING PHYSICIAN: Bettey Costa, MD  DATE OF DISCHARGE: 12/22/2017  PRIMARY CARE PHYSICIAN: [name], MD    ADMISSION DIAGNOSIS:  Pneumonia of left lower lobe due to infectious organism [facility]) [J18.1] Acute on chronic respiratory failure with hypoxia and hypercapnia (HCC) [J96.21, J96.22]  DISCHARGE DIAGNOSIS:   Acute hypoxic respiratory failure in the setting of community-acquired pneumonia  SECONDARY DIAGNOSIS:   Past Medical History:  Diagnosis Date   Anemia 02/2013   F/U with Dr. Grayland Ormond for Feraheme injections   Anxiety    Arthritis    Possible RA - Follow up appt with Dr. Jefm Bryant   Cellulitis of arm, right    Diabetes insipidus ([place])    On desmopressin   GERD (gastroesophageal reflux disease)    H/O seasonal allergies    H/O transfusion of packed red blood cells 02/2013   3 units PRBC for severe anemia 02/2013   Herniated disc    History of kidney stones    Hyperlipidemia    Hypertension    Hyperthyroidism    s/p radiation therapy, now hypothyroidism   IBS (irritable bowel syndrome)    Migraine    Pacemaker    a. s/p successful implantation of a Medtronic CapSureFix Novus MRI SureScan serial # [id number] on 06/28/16 by Dr. Caryl Comes for sinus node dysfunction.   Pancreatic cyst    Paroxysmal A-fib [facility])    Renal cell cancer, right ([place]) 10/23/2015   Right partial nephrectomy.    HOSPITAL COURSE:  77 year old female with history of renal cell cancer, PAF and anemia who presents to the emergency room with shortness of breath.   1. Acute hypoxic respiratory failure in the setting of community-acquired pneumonia Patient weaned off BiPAP to nasal cannula -weaned to RA. Stas 93% -no need for po prednisone  2. Community-acquired pneumonia:  -Continue ceftriaxone  and azithromycin --change to oral abxs -MRSA PCR negative  3. Elevated troponin: Continue to monitor troponins This is likely due to demand ischemia  4. 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 Grayland Ormond and Dr Baruch Gouty -she is been getting chemo and radiation therapy  8.History of renal cell carcinoma right kidney status post partial nephrectomy  9.History of rheumatoid arthritis:  Continue Plaquenil. Follows with Dr. Jefm Bryant   Overall improving. Pt feels weak. She does not want HHPT. CM for d/c planning. Pt is OK to go to [facility] today.  CONSULTS OBTAINED:    DRUG ALLERGIES:   Allergies  Allergen Reactions   Metrizamide     Other reaction(s): Other (See Comments)   Adhesive [Tape] Other (See Comments)    "irritate skin"   Augmentin [Amoxicillin-Pot Clavulanate] Diarrhea    Severe diarrhea   Codeine     REACTION: NAUSEA   Morphine And Related Other (See Comments)    Shut down her organs   Other     Skin irritation   Tetanus Toxoid     REACTION: ARM SWELLING,REDNESS   Tramadol Nausea And Vomiting    DISCHARGE MEDICATIONS:   Allergies as of 12/23/2017      Reactions   Metrizamide    Other reaction(s): Other (See  Comments)   Adhesive [tape] Other (See Comments)   "irritate skin"   Augmentin [amoxicillin-pot Clavulanate] Diarrhea   Severe diarrhea   Codeine    REACTION: NAUSEA   Morphine And Related Other (See Comments)   Shut down her organs   Other    Skin irritation   Tetanus Toxoid    REACTION: ARM SWELLING,REDNESS   Tramadol Nausea And Vomiting      Medication List    TAKE these medications   acetaminophen 325 MG tablet Commonly known as:  TYLENOL Take 2 tablets (650 mg total) by mouth every 6  (six) hours as needed for pain.   albuterol 108 (90 Base) MCG/ACT inhaler Commonly known as:  PROVENTIL HFA;VENTOLIN HFA Inhale 2 puffs into the lungs every 6 (six) hours as needed for wheezing or shortness of breath.   ALPRAZolam 0.25 MG tablet Commonly known as:  XANAX TAKE ONE TABLET BY MOUTH TWICE A DAY AS NEEDED FOR ANXIETY What changed:    how much to take  how to take this  when to take this  reasons to take this  additional instructions   brimonidine 0.2 % ophthalmic solution Commonly known as:  ALPHAGAN Place 1 drop into both eyes 2 (two) times daily.   CALCIUM-CARB 600 + D 600-125 MG-UNIT Tabs Generic drug:  Calcium Carbonate-Vitamin D Take 2 tablets by mouth daily.   carvedilol 12.5 MG tablet Commonly known as:  COREG Take 1 tablet (12.5 mg total) by mouth 2 (two) times daily. What changed:    medication strength  how much to take   cefdinir 300 MG capsule Commonly known as:  OMNICEF Take 1 capsule (300 mg total) by mouth every 12 (twelve) hours.   cholecalciferol 1000 units tablet Commonly known as:  VITAMIN D Take 1,000 Units by mouth daily.   dorzolamide 2 % ophthalmic solution Commonly known as:  TRUSOPT Place 1 drop into both eyes 2 (two) times daily.   ferrous sulfate 325 (65 FE) MG tablet Take 325 mg by mouth daily with breakfast.   Fish Oil 1000 MG Caps Take 1,000 mg by mouth daily.   furosemide 20 MG tablet Commonly known as:  LASIX Take 0.5 tablets (10 mg total) by mouth daily.   gabapentin 300 MG capsule Commonly known as:  NEURONTIN TAKE ONE CAPSULE BY MOUTH THREE TIMES daily for neuropathy What changed:    how much to take  how to take this  when to take this  additional instructions   hydroxychloroquine 200 MG tablet Commonly known as:  PLAQUENIL Take 400 mg by mouth daily.   latanoprost 0.005 % ophthalmic solution Commonly known as:  XALATAN Place 1 drop into both eyes at bedtime.   levothyroxine 112 MCG  tablet Commonly known as:  SYNTHROID, LEVOTHROID Take 1 tablet (112 mcg total) by mouth daily with breakfast.   lidocaine-prilocaine cream Commonly known as:  EMLA Apply to affected area once   losartan 100 MG tablet Commonly known as:  COZAAR Take 100 mg by mouth daily.   mirabegron ER 50 MG Tb24 tablet Commonly known as:  MYRBETRIQ Take 1 tablet (50 mg total) by mouth daily.   mirtazapine 15 MG tablet Commonly known as:  REMERON Take 1 tablet (15 mg total) by mouth at bedtime.   nitroGLYCERIN 0.4 MG/SPRAY spray Commonly known as:  NITROLINGUAL Place 1 spray under the tongue every 5 (five) minutes x 3 doses as needed for chest pain.   ondansetron 8 MG tablet Commonly known as:  ZOFRAN Take 1 tablet (8 mg total) by mouth 2 (two) times daily as needed for refractory nausea / vomiting. What changed:  additional instructions   pantoprazole 40 MG tablet Commonly known as:  PROTONIX Take 40 mg by mouth daily.   potassium chloride SA 20 MEQ tablet Commonly known as:  K-DUR,KLOR-CON Take 1 tablet (20 mEq total) by mouth daily.   prochlorperazine 10 MG tablet Commonly known as:  COMPAZINE Take 1 tablet (10 mg total) by mouth every 6 (six) hours as needed (Nausea or vomiting). What changed:  additional instructions   timolol 0.5 % ophthalmic solution Commonly known as:  TIMOPTIC Place 1 drop into both eyes 2 (two) times daily.   vitamin C 500 MG tablet Commonly known as:  ASCORBIC ACID Take 500 mg by mouth daily.       If you experience worsening of your admission symptoms, develop shortness of breath, life threatening emergency, suicidal or homicidal thoughts you must seek medical attention immediately by calling 911 or calling your MD immediately  if symptoms less severe.  You Must read complete instructions/literature along with all the possible adverse reactions/side effects for all the Medicines you take and that have been prescribed to you. Take any new Medicines  after you have completely understood and accept all the possible adverse reactions/side effects.   Please note  You were cared for by a hospitalist during your hospital stay. If you have any questions about your discharge medications or the care you received while you were in the hospital after you are discharged, you can call the unit and asked to speak with the hospitalist on call if the hospitalist that took care of you is not available. Once you are discharged, your primary care physician will handle any further medical issues. Please note that NO REFILLS for any discharge medications will be authorized once you are discharged, as it is imperative that you return to your primary care physician (or establish a relationship with a primary care physician if you do not have one) for your aftercare needs so that they can reassess your need for medications and monitor your lab values. Today   SUBJECTIVE   Doing well. No new complaints  VITAL SIGNS:  Blood pressure (!) 178/86, pulse 96, temperature 98.5 F (36.9 C), temperature source Oral, resp. rate 14, height 5\' 2"  (1.575 m), weight 63.1 kg (139 lb 3.2 oz), SpO2 94 %.  I/O:    Intake/Output Summary (Last 24 hours) at 12/23/2017 1132 Last data filed at 12/22/2017 1837 Gross per 24 hour  Intake 360 ml  Output --  Net 360 ml    PHYSICAL EXAMINATION:  GENERAL:  77 y.o.-year-old patient lying in the bed with no acute distress.  EYES: Pupils equal, round, reactive to light and accommodation. No scleral icterus. Extraocular muscles intact.  HEENT: Head atraumatic, normocephalic. Oropharynx and nasopharynx clear.  NECK:  Supple, no jugular venous distention. No thyroid enlargement, no tenderness.  LUNGS:distant breath sounds bilaterally, no wheezing, rales,rhonchi or crepitation. No use of accessory muscles of respiration.  CARDIOVASCULAR: S1, S2 normal. No murmurs, rubs, or gallops.  ABDOMEN: Soft, non-tender, non-distended. Bowel sounds  present. No organomegaly or mass.  EXTREMITIES: No pedal edema, cyanosis, or clubbing.  NEUROLOGIC: Cranial nerves II through XII are intact. Muscle strength 5/5 in all extremities. Sensation intact. Gait not checked.  PSYCHIATRIC: The patient is alert and oriented x 3.  SKIN: No obvious rash, lesion, or ulcer.   DATA REVIEW:   CBC  Recent  Labs  Lab 12/20/17 0007  WBC 7.1  HGB 8.4*  HCT 25.2*  PLT 150    Chemistries  Recent Labs  Lab 12/19/17 0841 12/20/17 0007  NA 139 140  K 4.6 4.1  CL 105 106  CO2 22 24  GLUCOSE 156* 169*  BUN 10 11  CREATININE 0.87 0.61  CALCIUM 9.1 8.8*  MG 1.8  --     Microbiology Results   Recent Results (from the past 240 hour(s))  MRSA PCR Screening     Status: None   Collection Time: 12/19/17 11:27 AM  Result Value Ref Range Status   MRSA by PCR NEGATIVE NEGATIVE Final    Comment:        The GeneXpert MRSA Assay (FDA approved for NASAL specimens only), is one component of a comprehensive MRSA colonization surveillance program. It is not intended to diagnose MRSA infection nor to guide or monitor treatment for MRSA infections. Performed at [facility], [address], [place], [place] [address]     RADIOLOGY:  No results found.   Management plans discussed with the patient, family and they are in agreement.  CODE STATUS:     Code Status Orders  (From admission, onward)        Start     Ordered   12/19/17 1114  Full code  Continuous     12/19/17 1113    Code Status History    Date Active Date Inactive Code Status Order ID Comments User Context   06/28/2016 1645 06/29/2016 1713 Full Code [id number]  Deboraha Sprang, MD Inpatient   12/27/2012 0049 01/04/2013 2154 Full Code [id number]  Bonnielee Haff, MD Inpatient    Advance Directive Documentation     Most Recent Value  Type of Advance Directive  Healthcare Power of Attorney, Living will  Pre-existing out of facility DNR order (yellow form or pink MOST form)   --  "MOST" Form in Place?  --      TOTAL TIME TAKING CARE OF THIS PATIENT: 40 minutes.    Fritzi Mandes M.D on 12/23/2017 at 11:32 AM  Between 7am to 6pm - Pager - [phone] After 6pm go to [email or web address] - password [email or web address] [facility]  Office  [phone]  CC: Primary care physician; [name], MD

## 2017-12-23 NOTE — Progress Notes (Signed)
Discussed discharge instructions and medications with patient. IV removed. All questions addressed. Patient awaiting pick up by daughter.  Clarise Cruz, RN

## 2017-12-23 NOTE — Progress Notes (Signed)
Discharged via w/c by volunteer services. Belongings sent with patient and family.

## 2017-12-25 ENCOUNTER — Ambulatory Visit: Payer: Medicare Other | Admitting: Radiation Oncology

## 2017-12-26 LAB — BLOOD GAS, VENOUS
ACID-BASE EXCESS: 0 mmol/L (ref 0.0–2.0)
Bicarbonate: 29.1 mmol/L — ABNORMAL HIGH (ref 20.0–28.0)
DELIVERY SYSTEMS: POSITIVE
FIO2: 0.4
PH VEN: 7.22 — AB (ref 7.250–7.430)
Patient temperature: 37
pCO2, Ven: 71 mmHg (ref 44.0–60.0)

## 2017-12-31 ENCOUNTER — Ambulatory Visit (INDEPENDENT_AMBULATORY_CARE_PROVIDER_SITE_OTHER): Payer: Medicare Other | Admitting: Internal Medicine

## 2017-12-31 ENCOUNTER — Ambulatory Visit: Payer: Medicare Other

## 2017-12-31 ENCOUNTER — Ambulatory Visit (INDEPENDENT_AMBULATORY_CARE_PROVIDER_SITE_OTHER): Payer: Medicare Other

## 2017-12-31 ENCOUNTER — Encounter: Payer: Self-pay | Admitting: Internal Medicine

## 2017-12-31 VITALS — BP 112/60 | HR 74 | Temp 98.3°F | Resp 16 | Ht 62.0 in | Wt 136.8 lb

## 2017-12-31 DIAGNOSIS — R091 Pleurisy: Secondary | ICD-10-CM | POA: Diagnosis not present

## 2017-12-31 DIAGNOSIS — R0789 Other chest pain: Secondary | ICD-10-CM | POA: Diagnosis not present

## 2017-12-31 DIAGNOSIS — E538 Deficiency of other specified B group vitamins: Secondary | ICD-10-CM

## 2017-12-31 DIAGNOSIS — J9601 Acute respiratory failure with hypoxia: Secondary | ICD-10-CM

## 2017-12-31 DIAGNOSIS — Z09 Encounter for follow-up examination after completed treatment for conditions other than malignant neoplasm: Secondary | ICD-10-CM

## 2017-12-31 DIAGNOSIS — R5383 Other fatigue: Secondary | ICD-10-CM | POA: Diagnosis not present

## 2017-12-31 LAB — COMPREHENSIVE METABOLIC PANEL
ALT: 10 U/L (ref 0–35)
AST: 16 U/L (ref 0–37)
Albumin: 3.7 g/dL (ref 3.5–5.2)
Alkaline Phosphatase: 55 U/L (ref 39–117)
BUN: 12 mg/dL (ref 6–23)
CALCIUM: 9.8 mg/dL (ref 8.4–10.5)
CO2: 28 meq/L (ref 19–32)
CREATININE: 0.71 mg/dL (ref 0.40–1.20)
Chloride: 101 mEq/L (ref 96–112)
GFR: 102.8 mL/min (ref >60.00)
Glucose, Bld: 99 mg/dL (ref 70–99)
Potassium: 3.9 mEq/L (ref 3.5–5.1)
Sodium: 138 mEq/L (ref 135–145)
TOTAL PROTEIN: 8.3 g/dL (ref 6.0–8.3)
Total Bilirubin: 0.6 mg/dL (ref 0.2–1.2)

## 2017-12-31 LAB — CBC WITH DIFFERENTIAL/PLATELET
BASOS ABS: 0 10*3/uL (ref 0.0–0.1)
Basophils Relative: 0.3 % (ref 0.0–3.0)
EOS ABS: 0 10*3/uL (ref 0.0–0.7)
Eosinophils Relative: 0.3 % (ref 0.0–5.0)
HEMATOCRIT: 29.4 % — AB (ref 36.0–46.0)
Hemoglobin: 9.5 g/dL — ABNORMAL LOW (ref 12.0–15.0)
LYMPHS PCT: 10.2 % — AB (ref 12.0–46.0)
Lymphs Abs: 0.7 10*3/uL (ref 0.7–4.0)
MCHC: 32.3 g/dL (ref 30.0–36.0)
MCV: 95 fl (ref 78.0–100.0)
MONOS PCT: 41 % — AB (ref 3.0–12.0)
Monocytes Absolute: 2.7 10*3/uL — ABNORMAL HIGH (ref 0.1–1.0)
NEUTROS ABS: 3.2 10*3/uL (ref 1.4–7.7)
Neutrophils Relative %: 48.2 % (ref 43.0–77.0)
PLATELETS: 167 10*3/uL (ref 150.0–400.0)
RBC: 3.1 Mil/uL — ABNORMAL LOW (ref 3.87–5.11)
RDW: 18 % — ABNORMAL HIGH (ref 11.5–15.5)
WBC: 6.7 10*3/uL (ref 4.0–10.5)

## 2017-12-31 MED ORDER — PREDNISONE 10 MG PO TABS: ORAL_TABLET | ORAL | 0 refills | Status: DC

## 2017-12-31 MED ORDER — CYANOCOBALAMIN 1000 MCG/ML IJ SOLN
1000.0000 ug | Freq: Once | INTRAMUSCULAR | Status: AC
  Administered 2017-12-31: 1000 ug via INTRAMUSCULAR

## 2017-12-31 NOTE — Progress Notes (Signed)
Subjective:  Patient ID: Caroline Nichols, female    DOB: [birth year]  Age: 77 y.o. MRN: [id number]  CC: The primary encounter diagnosis was B12 deficiency. Diagnoses of Pleurisy, Fatigue, unspecified type, Acute respiratory failure with hypoxemia ([place]), and Hospital discharge follow-up were also pertinent to this visit.  HPI Caroline Nichols presents for hospital follow up .  Patient was [facility] to [facility] on [facility] 17th with acute hypoxic respiratory failure and treated for  LLL  Pneumonia, and she had  elevated troponins secondray to demand ischemia  Patient received NIVVM with BIPAP and was weatned to room air by time of discharge,  Patient states that ambulatory   sats were NOT CHECKED prior t odischarge    Abx were changed from ceftiraxone and azithromycin to oral Omnivir 300 mg bid  At discharge on May 21 .  Home PT was deferred by patient.    Since discharge she has felt weak, s hort of breath with minimal exertion.  Tires easily,  Appetite poor.  She did not feel well enough to go home,  But was told she could not stay   States that she Had diarrhea up to [place] y during hospitalization and was given Imodium.  No testing for c dificile was done.  Fortumately her Stools since discharge have been soft , but not  Watery.NOT TAKING A PROBIOTIC DAILY   Resting sats here in office 87%. NEW  REPORT OF PLEURITIC  PAIN UNDER RIGHT BREAST BECAME NOTICEABLE  TODAY   Has large cell neuroendocrine carcinoma of LLL diagnosed Dec 2018    Outpatient Medications Prior to Visit  Medication Sig Dispense Refill  . acetaminophen (TYLENOL) 325 MG tablet Take 2 tablets (650 mg total) by mouth every 6 (six) hours as needed for pain. (Patient taking differently: Take 650 mg by mouth every 6 (six) hours as needed for pain. )    . albuterol (PROVENTIL HFA;VENTOLIN HFA) 108 (90 Base) MCG/ACT inhaler Inhale 2 puffs into the lungs every 6 (six) hours as needed for wheezing or shortness of breath. 1 Inhaler 2  .  ALPRAZolam (XANAX) 0.25 MG tablet TAKE ONE TABLET BY MOUTH TWICE A DAY AS NEEDED FOR ANXIETY (Patient taking differently: Take 0.25 mg by mouth 2 (two) times daily as needed for anxiety. ) 60 tablet 0  . brimonidine (ALPHAGAN) 0.2 % ophthalmic solution Place 1 drop into both eyes 2 (two) times daily.    . Calcium Carbonate-Vitamin D (CALCIUM-CARB 600 + D) 600-125 MG-UNIT TABS Take 2 tablets by mouth daily.    . carvedilol (COREG) 12.5 MG tablet Take 1 tablet (12.5 mg total) by mouth 2 (two) times daily. 30 tablet 1  . cholecalciferol (VITAMIN D) 1000 units tablet Take 1,000 Units by mouth daily.    . dorzolamide (TRUSOPT) 2 % ophthalmic solution Place 1 drop into both eyes 2 (two) times daily.     . ferrous sulfate 325 (65 FE) MG tablet Take 325 mg by mouth daily with breakfast.    . furosemide (LASIX) 20 MG tablet Take 0.5 tablets (10 mg total) by mouth daily. 45 tablet 4  . gabapentin (NEURONTIN) 300 MG capsule TAKE ONE CAPSULE BY MOUTH THREE TIMES daily for neuropathy (Patient taking differently: Take 300 mg by mouth 3 (three) times daily. ) 90 capsule 5  . hydroxychloroquine (PLAQUENIL) 200 MG tablet Take 400 mg by mouth daily.     Marland Kitchen latanoprost (XALATAN) 0.005 % ophthalmic solution Place 1 drop into both eyes at  bedtime.     Marland Kitchen levothyroxine (SYNTHROID, LEVOTHROID) 112 MCG tablet Take 1 tablet (112 mcg total) by mouth daily with breakfast. 90 tablet 1  . lidocaine-prilocaine (EMLA) cream Apply to affected area once 30 g 3  . losartan (COZAAR) 100 MG tablet Take 100 mg by mouth daily.    . mirabegron ER (MYRBETRIQ) 50 MG TB24 tablet Take 1 tablet (50 mg total) by mouth daily. 30 tablet 2  . mirtazapine (REMERON) 15 MG tablet Take 1 tablet (15 mg total) by mouth at bedtime. 30 tablet 1  . nitroGLYCERIN (NITROLINGUAL) 0.4 MG/SPRAY spray Place 1 spray under the tongue every 5 (five) minutes x 3 doses as needed for chest pain. 4.9 g 0  . Omega-3 Fatty Acids (FISH OIL) 1000 MG CAPS Take 1,000 mg by  mouth daily.     . ondansetron (ZOFRAN) 8 MG tablet Take 1 tablet (8 mg total) by mouth 2 (two) times daily as needed for refractory nausea / vomiting. (Patient taking differently: Take 8 mg by mouth 2 (two) times daily as needed for refractory nausea / vomiting. For refractory nausea/vomiting) 30 tablet 2  . pantoprazole (PROTONIX) 40 MG tablet Take 40 mg by mouth daily.    . potassium chloride SA (K-DUR,KLOR-CON) 20 MEQ tablet Take 1 tablet (20 mEq total) by mouth daily. 30 tablet 3  . prochlorperazine (COMPAZINE) 10 MG tablet Take 1 tablet (10 mg total) by mouth every 6 (six) hours as needed (Nausea or vomiting). (Patient taking differently: Take 10 mg by mouth every 6 (six) hours as needed (Nausea or vomiting). For nausea/vomiting) 60 tablet 2  . timolol (TIMOPTIC) 0.5 % ophthalmic solution Place 1 drop into both eyes 2 (two) times daily.     . vitamin C (ASCORBIC ACID) 500 MG tablet Take 500 mg by mouth daily.    . cefdinir (OMNICEF) 300 MG capsule Take 1 capsule (300 mg total) by mouth every 12 (twelve) hours. (Patient not taking: Reported on 12/31/2017) 8 capsule 0   No facility-administered medications prior to visit.     Review of Systems;  Patient denies headache, fevers, malaise, unintentional weight loss, skin rash, eye pain, sinus congestion and sinus pain, sore throat, dysphagia,  hemoptysis , cough, dyspnea, wheezing, chest pain, palpitations, orthopnea, edema, abdominal pain, nausea, melena, diarrhea, constipation, flank pain, dysuria, hematuria, urinary  Frequency, nocturia, numbness, tingling, seizures,  Focal weakness, Loss of consciousness,  Tremor, insomnia, depression, anxiety, and suicidal ideation.      Objective:  BP 112/60 (BP Location: Left Arm, Patient Position: Sitting, Cuff Size: Normal)   Pulse 74   Temp 98.3 F (36.8 C) (Oral)   Resp 16   Ht 5\' 2"  (1.575 m)   Wt 136 lb 12.8 oz (62.1 kg)   SpO2 (!) 87%   BMI 25.02 kg/m   BP Readings from Last 3 Encounters:    12/31/17 112/60  12/23/17 (!) 178/86  11/19/17 126/78    Wt Readings from Last 3 Encounters:  12/31/17 136 lb 12.8 oz (62.1 kg)  12/23/17 139 lb 3.2 oz (63.1 kg)  11/19/17 140 lb (63.5 kg)    General appearance: alert, cooperative and appears stated age Ears: normal TM's and external ear canals both ears Throat: lips, mucosa, and tongue normal; teeth and gums normal Neck: no adenopathy, no carotid bruit, supple, symmetrical, trachea midline and thyroid not enlarged, symmetric, no tenderness/mass/nodules Back: symmetric, no curvature. ROM normal. No CVA tenderness. Lungs: clear to auscultation bilaterally Heart: regular rate and rhythm, S1, S2  normal, no murmur, click, rub or gallop Abdomen: soft, non-tender; bowel sounds normal; no masses,  no organomegaly Pulses: 2+ and symmetric Skin: Skin color, texture, turgor normal. No rashes or lesions Lymph nodes: Cervical, supraclavicular, and axillary nodes normal.  Lab Results  Component Value Date   HGBA1C 5.3 06/09/2014   HGBA1C 5.2 12/27/2012    Lab Results  Component Value Date   CREATININE 0.71 12/31/2017   CREATININE 0.61 12/20/2017   CREATININE 0.87 12/19/2017    Lab Results  Component Value Date   WBC 6.7 12/31/2017   HGB 9.5 (L) 12/31/2017   HCT 29.4 (L) 12/31/2017   PLT 167.0 12/31/2017   GLUCOSE 99 12/31/2017   CHOL 166 11/26/2017   TRIG 78 11/26/2017   HDL 60 11/26/2017   LDLCALC 89 11/26/2017   ALT 10 12/31/2017   AST 16 12/31/2017   NA 138 12/31/2017   K 3.9 12/31/2017   CL 101 12/31/2017   CREATININE 0.71 12/31/2017   BUN 12 12/31/2017   CO2 28 12/31/2017   TSH 1.19 11/26/2017   INR 1.20 12/19/2017   HGBA1C 5.3 06/09/2014   MICROALBUR 0.7 06/09/2014    Dg Chest Portable 1 View  Result Date: 12/19/2017 CLINICAL DATA:  Shortness of breath, dyspnea, oxygen desaturation EXAM: PORTABLE CHEST 1 VIEW COMPARISON:  Portable exam 0910 hours compared to 12/18/2016 FINDINGS: RIGHT jugular Port-A-Cath with  tip projecting over SVC. Sequential LEFT subclavian transvenous pacemaker with leads projecting over RIGHT atrium and RIGHT ventricle. Enlargement of cardiac silhouette with pulmonary vascular congestion. Mediastinal contours normal. Consolidation LEFT lower lobe consistent with pneumonia though aspiration could give a similar appearance. Remaining lungs clear. No pleural effusion or pneumothorax. Bones demineralized. IMPRESSION: Enlargement of cardiac silhouette. LEFT lower lobe consolidation consistent with pneumonia though aspiration could cause a similar appearance. Electronically Signed   By: Lavonia Dana M.D.   On: 12/19/2017 09:23    Assessment & Plan:   Problem List Items Addressed This Visit    Fatigue   Relevant Orders   CBC with Differential/Platelet (Completed)   Comprehensive metabolic panel (Completed)   B12 deficiency - Primary   Relevant Medications   cyanocobalamin ((VITAMIN B-12)) injection 1,000 mcg (Completed)   Hospital discharge follow-up    Patient has not improved since  discharge and remains hypoxic with ambulation,  Which inuforutnatlye was not checked prior to discharge from [facility].  This has been addressed  at the visit today for hospital follow up. All labs , imaging studies and progress notes from admission were reviewed with patient today        Acute respiratory failure with hypoxemia ([place])    Patient continues to require supplemental oxygen to maintain oxygen sats  above 88%  . Her resting 02 sat was 895 today and dropped to 86% with ambulation .  With supplemental oxygen at 2 L/min her sats remained above 88%.  Chest x ray done today shows that her LLL infiltrate is improving   Supplemental oxygen ordered       Other Visit Diagnoses    Pleurisy       Relevant Orders   DG Chest 2 View (Completed)      I have discontinued Silvano Rusk. Saintvil's cefdinir. I am also having her start on predniSONE. Additionally, I am having her maintain her Fish Oil, acetaminophen,  vitamin C, ferrous sulfate, brimonidine, hydroxychloroquine, latanoprost, dorzolamide, lidocaine-prilocaine, ondansetron, prochlorperazine, potassium chloride SA, timolol, nitroGLYCERIN, gabapentin, furosemide, ALPRAZolam, mirabegron ER, mirtazapine, levothyroxine, cholecalciferol, losartan, pantoprazole, Calcium Carbonate-Vitamin D,  carvedilol, and albuterol. We administered cyanocobalamin.  Meds ordered this encounter  Medications  . cyanocobalamin ((VITAMIN B-12)) injection 1,000 mcg  . predniSONE (DELTASONE) 10 MG tablet    Sig: 6 tablets on Day 1 , then reduce by 1 tablet daily until gone    Dispense:  21 tablet    Refill:  0    Medications Discontinued During This Encounter  Medication Reason  . cefdinir (OMNICEF) 300 MG capsule Completed Course    Follow-up: Return in about 1 month (around 01/28/2018) for pneumonia,  poor appetite.   [name], MD

## 2017-12-31 NOTE — Patient Instructions (Addendum)
  Please take a probiotic ( Align, Floraque or Culturelle) , the generic version of one of these, OR A SERVIGN OF YOGURT WITH LIVE CULTURES  For a minimum of 3 weeks to prevent a serious antibiotic associated diarrhea  Called clostridium dificile colitis   I am going to order supplemental oxygen if you qualify,  Based on your oxygen level today   I am adding a prednisone taper to take for the next 6 days

## 2018-01-01 ENCOUNTER — Telehealth: Payer: Self-pay | Admitting: Internal Medicine

## 2018-01-01 ENCOUNTER — Other Ambulatory Visit: Payer: Self-pay

## 2018-01-01 DIAGNOSIS — Z09 Encounter for follow-up examination after completed treatment for conditions other than malignant neoplasm: Secondary | ICD-10-CM | POA: Insufficient documentation

## 2018-01-01 DIAGNOSIS — J9610 Chronic respiratory failure, unspecified whether with hypoxia or hypercapnia: Secondary | ICD-10-CM | POA: Insufficient documentation

## 2018-01-01 MED ORDER — ALPRAZOLAM 0.25 MG PO TABS: ORAL_TABLET | ORAL | 0 refills | Status: DC

## 2018-01-01 NOTE — Telephone Encounter (Signed)
Copied from [place] [id number]. Topic: Quick Communication - See Telephone Encounter >> Jan 01, 2018 10:30 AM Ether Griffins B wrote: CRM for notification. See Telephone encounter for: 01/01/18.  Pt was in the office yesterday 12/31/17 with Dr. Derrel Nip. She was told oxygen would be ordered for her and she hasn't received a call from anyone regarding it. She is calling to check on the status of that. CB# [phone]

## 2018-01-01 NOTE — Assessment & Plan Note (Signed)
Patient has not improved since  discharge and remains hypoxic with ambulation,  Which inuforutnatlye was not checked prior to discharge from [facility].  This has been addressed  at the visit today for hospital follow up. All labs , imaging studies and progress notes from admission were reviewed with patient today

## 2018-01-01 NOTE — Telephone Encounter (Signed)
The OV is completed and signed

## 2018-01-01 NOTE — Telephone Encounter (Signed)
Please advise 

## 2018-01-01 NOTE — Telephone Encounter (Signed)
Refilled: 11/28/2017 Last OV: 12/31/2017 Next OV: 02/18/2018  Pt forgot to mention to Dr. Derrel Nip yesterday during her office visit. Pt stated that she only has one pill left.

## 2018-01-01 NOTE — Assessment & Plan Note (Signed)
Patient continues to require supplemental oxygen to maintain oxygen sats  above 88%  . Her resting 02 sat was 895 today and dropped to 86% with ambulation .  With supplemental oxygen at 2 L/min her sats remained above 88%.  Chest x ray done today shows that her LLL infiltrate is improving   Supplemental oxygen ordered

## 2018-01-01 NOTE — Telephone Encounter (Signed)
Can you let me know when the office note is completed so I can send over the order for the O2.

## 2018-01-02 NOTE — Telephone Encounter (Signed)
Ordered has been faxed.

## 2018-01-06 ENCOUNTER — Other Ambulatory Visit: Payer: Self-pay | Admitting: *Deleted

## 2018-01-06 ENCOUNTER — Telehealth: Payer: Self-pay | Admitting: Urology

## 2018-01-06 ENCOUNTER — Telehealth: Payer: Self-pay | Admitting: Internal Medicine

## 2018-01-06 MED ORDER — MIRABEGRON ER 50 MG PO TB24: 50.0000 mg | ORAL_TABLET | Freq: Every day | ORAL | 11 refills | Status: DC

## 2018-01-06 NOTE — Telephone Encounter (Signed)
Yes,  That's fine,  And please  schedule her for a nurse visit PRIOR TO THAT that for glucometer teaching if available

## 2018-01-06 NOTE — Telephone Encounter (Signed)
Would it be okay to schedule pt for Friday 01/09/2018 at 4pm?

## 2018-01-06 NOTE — Telephone Encounter (Signed)
Copied from [place] [id number]. Topic: Quick Communication - See Telephone Encounter >> Jan 06, 2018  1:39 PM Percell Belt A wrote: CRM for notification. See Telephone encounter for: 01/06/18.  Pt called in and state that she is waking up at night.  She is having cold chills, and socked with sweet and really hungry.  She would like to know what she needs to do or what this could be?  It been going off and on since the kemo?    Best number  [phone]

## 2018-01-06 NOTE — Telephone Encounter (Signed)
Medication sent to pharmacy, patient notified.

## 2018-01-06 NOTE — Telephone Encounter (Signed)
Patient called today asking for a refill on her (MYRBETRIQ) 50 MG TB24 tablet to be sent into her pharmacy. Please let her know if this can be done or not.   Thanks, Sharyn Lull

## 2018-01-06 NOTE — Telephone Encounter (Signed)
Called patient and she states that she has been having night sweats for quite a while. She states that her appetite has gotten better but she has been waking up feeling really hungry and shaky. She says that she thought it was due to chemo but wasn't sure , she also states that she thought it maybe her blood sugar but when it was checked in the hospital no one gave her the results. Patient would like to come in and be seen.Please advise.

## 2018-01-07 LAB — CUP PACEART REMOTE DEVICE CHECK
Battery Voltage: 3.03 V
Brady Statistic AP VP Percent: 0.01 %
Brady Statistic AS VP Percent: 0.05 %
Brady Statistic RA Percent Paced: 4.82 %
Date Time Interrogation Session: 20190509133223
Implantable Lead Implant Date: 20171124
Implantable Lead Location: 753860
Implantable Lead Model: 5076
Implantable Pulse Generator Implant Date: 20171124
Lead Channel Impedance Value: 399 Ohm
Lead Channel Pacing Threshold Pulse Width: 0.4 ms
Lead Channel Sensing Intrinsic Amplitude: 13.125 mV
Lead Channel Setting Pacing Amplitude: 2 V
Lead Channel Setting Pacing Pulse Width: 0.4 ms
MDC IDC LEAD IMPLANT DT: 20171124
MDC IDC LEAD LOCATION: 753859
MDC IDC MSMT BATTERY REMAINING LONGEVITY: 115 mo
MDC IDC MSMT LEADCHNL RA IMPEDANCE VALUE: 323 Ohm
MDC IDC MSMT LEADCHNL RA IMPEDANCE VALUE: 361 Ohm
MDC IDC MSMT LEADCHNL RA PACING THRESHOLD AMPLITUDE: 0.75 V
MDC IDC MSMT LEADCHNL RA SENSING INTR AMPL: 1.375 mV
MDC IDC MSMT LEADCHNL RA SENSING INTR AMPL: 1.375 mV
MDC IDC MSMT LEADCHNL RV IMPEDANCE VALUE: 475 Ohm
MDC IDC MSMT LEADCHNL RV PACING THRESHOLD AMPLITUDE: 0.875 V
MDC IDC MSMT LEADCHNL RV PACING THRESHOLD PULSEWIDTH: 0.4 ms
MDC IDC MSMT LEADCHNL RV SENSING INTR AMPL: 13.125 mV
MDC IDC SET LEADCHNL RV PACING AMPLITUDE: 2.5 V
MDC IDC SET LEADCHNL RV SENSING SENSITIVITY: 0.9 mV
MDC IDC STAT BRADY AP VS PERCENT: 4.82 %
MDC IDC STAT BRADY AS VS PERCENT: 95.12 %
MDC IDC STAT BRADY RV PERCENT PACED: 0.06 %

## 2018-01-07 MED ORDER — MIRTAZAPINE 15 MG PO TABS: 15.0000 mg | ORAL_TABLET | Freq: Every day | ORAL | 1 refills | Status: DC

## 2018-01-07 NOTE — Telephone Encounter (Signed)
LMTCB. Need to let pt know that Dr. Derrel Nip is okay with her coming in on Friday at 11am. Pt is already scheduled. PEC may speak with pt.

## 2018-01-07 NOTE — Telephone Encounter (Signed)
Spoke with pr and she is aware of appt date and time.

## 2018-01-07 NOTE — Telephone Encounter (Signed)
Spoke with pt and she stated that she only has transportation until 12pm on Fridays. There is a hospital follow up opening at 11:00am would it be okay to schedule pt there?

## 2018-01-07 NOTE — Telephone Encounter (Signed)
Yes, thank you.

## 2018-01-08 ENCOUNTER — Encounter: Payer: Self-pay | Admitting: Internal Medicine

## 2018-01-09 ENCOUNTER — Other Ambulatory Visit: Payer: Self-pay | Admitting: Oncology

## 2018-01-09 ENCOUNTER — Encounter: Payer: Self-pay | Admitting: Internal Medicine

## 2018-01-09 ENCOUNTER — Telehealth: Payer: Self-pay | Admitting: *Deleted

## 2018-01-09 ENCOUNTER — Ambulatory Visit (INDEPENDENT_AMBULATORY_CARE_PROVIDER_SITE_OTHER): Payer: Medicare Other | Admitting: Internal Medicine

## 2018-01-09 VITALS — BP 92/58 | HR 60 | Temp 98.6°F | Resp 16 | Ht 62.0 in | Wt 137.6 lb

## 2018-01-09 DIAGNOSIS — C7A8 Other malignant neuroendocrine tumors: Secondary | ICD-10-CM

## 2018-01-09 DIAGNOSIS — R197 Diarrhea, unspecified: Secondary | ICD-10-CM | POA: Diagnosis not present

## 2018-01-09 DIAGNOSIS — E162 Hypoglycemia, unspecified: Secondary | ICD-10-CM | POA: Diagnosis not present

## 2018-01-09 DIAGNOSIS — R61 Generalized hyperhidrosis: Secondary | ICD-10-CM

## 2018-01-09 DIAGNOSIS — I9589 Other hypotension: Secondary | ICD-10-CM | POA: Diagnosis not present

## 2018-01-09 DIAGNOSIS — E861 Hypovolemia: Secondary | ICD-10-CM

## 2018-01-09 LAB — BASIC METABOLIC PANEL
BUN: 15 mg/dL (ref 6–23)
CALCIUM: 9.3 mg/dL (ref 8.4–10.5)
CO2: 25 meq/L (ref 19–32)
CREATININE: 0.78 mg/dL (ref 0.40–1.20)
Chloride: 104 mEq/L (ref 96–112)
GFR: 92.22 mL/min (ref >60.00)
GLUCOSE: 76 mg/dL (ref 70–99)
Potassium: 3.8 mEq/L (ref 3.5–5.1)
SODIUM: 139 meq/L (ref 135–145)

## 2018-01-09 LAB — CBC WITH DIFFERENTIAL/PLATELET
Basophils Absolute: 0 10*3/uL (ref 0.0–0.1)
Basophils Relative: 0.5 % (ref 0.0–3.0)
Eosinophils Absolute: 0 10*3/uL (ref 0.0–0.7)
Eosinophils Relative: 0.6 % (ref 0.0–5.0)
HCT: 31.4 % — ABNORMAL LOW (ref 36.0–46.0)
Hemoglobin: 10 g/dL — ABNORMAL LOW (ref 12.0–15.0)
LYMPHS ABS: 0.8 10*3/uL (ref 0.7–4.0)
Lymphocytes Relative: 12 % (ref 12.0–46.0)
MCHC: 31.9 g/dL (ref 30.0–36.0)
MCV: 96.7 fl (ref 78.0–100.0)
MONO ABS: 3 10*3/uL — AB (ref 0.1–1.0)
Monocytes Relative: 44.1 % — ABNORMAL HIGH (ref 3.0–12.0)
NEUTROS ABS: 2.9 10*3/uL (ref 1.4–7.7)
NEUTROS PCT: 42.8 % — AB (ref 43.0–77.0)
PLATELETS: 144 10*3/uL — AB (ref 150.0–400.0)
RBC: 3.25 Mil/uL — ABNORMAL LOW (ref 3.87–5.11)
RDW: 19.3 % — ABNORMAL HIGH (ref 11.5–15.5)
WBC: 6.7 10*3/uL (ref 4.0–10.5)

## 2018-01-09 LAB — HEMOGLOBIN A1C: Hgb A1c MFr Bld: 5.2 % (ref 4.6–6.5)

## 2018-01-09 LAB — CORTISOL: Cortisol, Plasma: 11.6 ug/dL

## 2018-01-09 MED ORDER — LOSARTAN POTASSIUM 50 MG PO TABS: 50.0000 mg | ORAL_TABLET | Freq: Every day | ORAL | 0 refills | Status: DC

## 2018-01-09 NOTE — Progress Notes (Signed)
Subjective:  Patient ID: Caroline Nichols, female    DOB: 05/26/1941  Age: 77 y.o. MRN: [id number]  CC: The primary encounter diagnosis was Hypoglycemia. Diagnoses of Night sweats, Diarrhea in adult patient, and Hypotension due to hypovolemia were also pertinent to this visit.  HPI  AYME SHORT presents for  New onset  night sweats and chills accompanied by feeling tremulous and hungry .  Patient states that the symptoms are occurring during the day but she is able to eat frequently to avoid developing the full onset of symptoms including the sweats.   She Has been Having post prandial fecal urgency with loose stools , often watery but not always,  occurring within an hour after every meal. acccompaied by abdominal cramping.  No nausea,  Does not see undigested food, mucous or blood in her stools.  She has had occasional solid stools as well..  taking a probiotic   .  Lives at  [place] is too spicy there . Has requested that the food be plain .  Results have been inconsistent.  Received undercooked chicken one night recently   In the office today her CBG is 77 approx one hour AFTER EATING BREAKFAST. Breakfast was mostly starch and simple sugars.    No history of diabetes or prediabetes.   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    Admitted to [facility] in may with LLL pneumonia and hypoxic respiratory failure, finished Cefdnir on  May 25 and has been taking probiotics since hospital follow up  Outpatient Medications Prior to Visit  Medication Sig Dispense Refill  . acetaminophen (TYLENOL) 325 MG tablet Take 2 tablets (650 mg total) by mouth every 6 (six) hours as needed for pain. (Patient taking differently: Take 650  mg by mouth every 6 (six) hours as needed for pain. )    . albuterol (PROVENTIL HFA;VENTOLIN HFA) 108 (90 Base) MCG/ACT inhaler Inhale 2 puffs into the lungs every 6 (six) hours as needed for wheezing or shortness of breath. 1 Inhaler 2  . ALPRAZolam (XANAX) 0.25 MG tablet TAKE ONE TABLET BY MOUTH TWICE A DAY AS NEEDED FOR ANXIETY 60 tablet 0  . brimonidine (ALPHAGAN) 0.2 % ophthalmic solution Place 1 drop into both eyes 2 (two) times daily.    . Calcium Carbonate-Vitamin D (CALCIUM-CARB 600 + D) 600-125 MG-UNIT TABS Take 2 tablets by mouth daily.    . carvedilol (COREG) 12.5 MG tablet Take 1 tablet (12.5 mg total) by mouth 2 (two) times daily. 30 tablet 1  . cholecalciferol (VITAMIN D) 1000 units tablet Take 1,000 Units by mouth daily.    . dorzolamide (TRUSOPT) 2 % ophthalmic solution Place 1 drop into both eyes 2 (two) times daily.     . ferrous sulfate 325 (65 FE) MG tablet Take 325 mg by mouth daily with breakfast.    . furosemide (LASIX) 20 MG tablet Take 0.5 tablets (10 mg total) by mouth daily. 45 tablet 4  . gabapentin (NEURONTIN) 300 MG capsule TAKE ONE CAPSULE BY MOUTH THREE TIMES daily for neuropathy (Patient taking differently: Take 300 mg by mouth 3 (three) times daily. ) 90 capsule 5  . hydroxychloroquine (PLAQUENIL) 200 MG tablet Take 400 mg  by mouth daily.     Marland Kitchen latanoprost (XALATAN) 0.005 % ophthalmic solution Place 1 drop into both eyes at bedtime.     Marland Kitchen levothyroxine (SYNTHROID, LEVOTHROID) 112 MCG tablet Take 1 tablet (112 mcg total) by mouth daily with breakfast. 90 tablet 1  . lidocaine-prilocaine (EMLA) cream Apply to affected area once 30 g 3  . mirabegron ER (MYRBETRIQ) 50 MG TB24 tablet Take 1 tablet (50 mg total) by mouth daily. 30 tablet 11  . mirtazapine (REMERON) 15 MG tablet Take 1 tablet (15 mg total) by mouth at bedtime. 30 tablet 1  . nitroGLYCERIN (NITROLINGUAL) 0.4 MG/SPRAY spray Place 1 spray under the tongue every 5 (five) minutes x 3 doses as needed for chest  pain. 4.9 g 0  . Omega-3 Fatty Acids (FISH OIL) 1000 MG CAPS Take 1,000 mg by mouth daily.     . ondansetron (ZOFRAN) 8 MG tablet Take 1 tablet (8 mg total) by mouth 2 (two) times daily as needed for refractory nausea / vomiting. (Patient taking differently: Take 8 mg by mouth 2 (two) times daily as needed for refractory nausea / vomiting. For refractory nausea/vomiting) 30 tablet 2  . pantoprazole (PROTONIX) 40 MG tablet Take 40 mg by mouth daily.    . potassium chloride SA (K-DUR,KLOR-CON) 20 MEQ tablet Take 1 tablet (20 mEq total) by mouth daily. 30 tablet 3  . prochlorperazine (COMPAZINE) 10 MG tablet Take 1 tablet (10 mg total) by mouth every 6 (six) hours as needed (Nausea or vomiting). (Patient taking differently: Take 10 mg by mouth every 6 (six) hours as needed (Nausea or vomiting). For nausea/vomiting) 60 tablet 2  . timolol (TIMOPTIC) 0.5 % ophthalmic solution Place 1 drop into both eyes 2 (two) times daily.     . vitamin C (ASCORBIC ACID) 500 MG tablet Take 500 mg by mouth daily.    Marland Kitchen losartan (COZAAR) 100 MG tablet Take 100 mg by mouth daily.    . predniSONE (DELTASONE) 10 MG tablet 6 tablets on Day 1 , then reduce by 1 tablet daily until gone (Patient not taking: Reported on 01/09/2018) 21 tablet 0   No facility-administered medications prior to visit.     Review of Systems;  Patient denies headache, fevers, malaise, unintentional weight loss, skin rash, eye pain, sinus congestion and sinus pain, sore throat, dysphagia,  hemoptysis , cough, dyspnea, wheezing, chest pain, palpitations, orthopnea, edema, abdominal pain, nausea, melena, diarrhea, constipation, flank pain, dysuria, hematuria, urinary  Frequency, nocturia, numbness, tingling, seizures,  Focal weakness, Loss of consciousness,  Tremor, insomnia, depression, anxiety, and suicidal ideation.      Objective:  BP (!) 92/58 (BP Location: Left Arm, Patient Position: Sitting, Cuff Size: Normal)   Pulse 60   Temp 98.6 F (37 C)  (Oral)   Resp 16   Ht 5\' 2"  (1.575 m)   Wt 137 lb 9.6 oz (62.4 kg)   SpO2 93%   BMI 25.17 kg/m   BP Readings from Last 3 Encounters:  01/09/18 (!) 92/58  12/31/17 112/60  12/23/17 (!) 178/86    Wt Readings from Last 3 Encounters:  01/09/18 137 lb 9.6 oz (62.4 kg)  12/31/17 136 lb 12.8 oz (62.1 kg)  12/23/17 139 lb 3.2 oz (63.1 kg)    General appearance: pale, sickly appearing , alert, cooperative and appears stated age Neck: no adenopathy, no carotid bruit, supple, symmetrical, trachea midline and thyroid not enlarged, symmetric, no tenderness/mass/nodules Back: symmetric, no curvature. ROM normal. No CVA tenderness. Lungs: clear  to auscultation bilaterally Heart: regular rate and rhythm, S1, S2 normal, no murmur, click, rub or gallop Abdomen: soft, non-tender; bowel sounds normal; no masses,  no organomegaly Pulses: 2+ and symmetric Skin: Skin color, texture, turgor normal. No rashes or lesions Lymph nodes: Cervical, supraclavicular, and axillary nodes normal.     Assessment & Plan:   Problem List Items Addressed This Visit    Night sweats    Etiology unclear.  Her lung exam and chest x ray have improved since discharge .  She denies cough and dysuria..  Although she is having loose stools, they alternate with solid stools so c dificile or any other form of infectious colitis is less likely.  Her symptoms improve with eating, suggesting  hypoglycemia,  And a one hour post prandial cbg here in the office is 77.  Question whether this could be a paraneoplastic syndrome given her normal A1c,  Elevated c peptide and normal random insulin level . Random cortisol level is normal.  Patient was given a glucometer and instructed on how to use it.  Referral to Endocrinology plann.       Relevant Orders   Cortisol (Completed)   CBC with Differential/Platelet (Completed)   Hypotension    History and labs suggest hypovolemia due to diarrhea.  I have refuced her losartan dose to 50 mg  daily , her carvedilol to 6 mg bid and her diuretic to 1/2 tablet every other day .      Relevant Medications   losartan (COZAAR) 50 MG tablet   Diarrhea in adult patient    Has been occurring since mid May.  Alternates with solid stools.  No weight loss.  No leukocytosis (pancytopenia has been resolved for month )  . Recommend trial of Imodium        Other Visit Diagnoses    Hypoglycemia    -  Primary   Relevant Orders   Hemoglobin A1c (Completed)   Basic metabolic panel (Completed)   Insulin and C-Peptide (Completed)   Ambulatory referral to Endocrinology     A total of 40 minutes was spent with patient more than half of which was spent in counseling patient on the above mentioned issues , reviewing and explaining recent labs and imaging studies done, and coordination of care.  I have discontinued Silvano Rusk. Schnabel's predniSONE. I have also changed her losartan. Additionally, I am having her maintain her Fish Oil, acetaminophen, vitamin C, ferrous sulfate, brimonidine, hydroxychloroquine, latanoprost, dorzolamide, lidocaine-prilocaine, ondansetron, prochlorperazine, potassium chloride SA, timolol, nitroGLYCERIN, gabapentin, furosemide, levothyroxine, cholecalciferol, pantoprazole, Calcium Carbonate-Vitamin D, carvedilol, albuterol, ALPRAZolam, mirabegron ER, and mirtazapine.  Meds ordered this encounter  Medications  . losartan (COZAAR) 50 MG tablet    Sig: Take 1 tablet (50 mg total) by mouth daily.    Dispense:  30 tablet    Refill:  0    Medications Discontinued During This Encounter  Medication Reason  . predniSONE (DELTASONE) 10 MG tablet Completed Course  . losartan (COZAAR) 100 MG tablet     Follow-up: No follow-ups on file.   [name], MD

## 2018-01-09 NOTE — Telephone Encounter (Signed)
Done

## 2018-01-09 NOTE — Patient Instructions (Addendum)
Take 1/2 immodium tablet twice daily   (before breakfast and before  dinner )   Reduce your carvedilol to 6.125 mg  mg twice daily . And your losartan dose to 50 mg ( rx sent  To pharmacy for 30 day supply )   .  Reduce fluid pill to 1/2 tablet every other day until diarrhea resolves   Check blood sugar next episode of tremors

## 2018-01-09 NOTE — Telephone Encounter (Signed)
Patient is scheduled for whole body PET Monday and her last 2 have been skull to thigh, Is this what you want or can you change order to skull to thighs. Please advise

## 2018-01-10 LAB — INSULIN AND C-PEPTIDE, SERUM
C-Peptide: 7.1 ng/mL — ABNORMAL HIGH (ref 1.1–4.4)
INSULIN: 9.7 u[IU]/mL (ref 2.6–24.9)

## 2018-01-11 DIAGNOSIS — I959 Hypotension, unspecified: Secondary | ICD-10-CM | POA: Insufficient documentation

## 2018-01-11 DIAGNOSIS — R197 Diarrhea, unspecified: Secondary | ICD-10-CM | POA: Insufficient documentation

## 2018-01-11 DIAGNOSIS — R61 Generalized hyperhidrosis: Secondary | ICD-10-CM | POA: Insufficient documentation

## 2018-01-11 NOTE — Progress Notes (Signed)
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  HISTORY OF PRESENT ILLNESS: Patient returns to clinic today for further evaluation and discussion of her imaging results. She reports increasing weakness and fatigue.  She has significant shortness of breath and her oxygen saturations are decreased to 83% with ambulation. She denies any recent fevers or illnesses.  She does not complain of pain today. She has no neurologic complaints. She has a good appetite and denies weight loss.  She denies any chest pain or hemoptysis.  She denies any nausea, vomiting, constipation, or diarrhea.  She has no urinary complaints.  Patient offers no further specific complaints today.    REVIEW OF SYSTEMS:    Review of Systems  Constitutional: Positive for malaise/fatigue. Negative for fever and weight loss.  HENT: Negative.   Eyes: Negative.  Negative for blurred vision, double vision and pain.  Respiratory: Positive for shortness of breath. Negative for cough.   Cardiovascular: Negative.  Negative for chest pain and leg swelling.  Gastrointestinal: Negative.  Negative for abdominal pain, diarrhea and nausea.  Genitourinary: Negative.  Negative for dysuria.  Musculoskeletal: Negative.  Negative for falls.  Skin: Negative.  Negative for rash.  Neurological: Positive for weakness. Negative for sensory change and focal weakness.  Psychiatric/Behavioral: The patient is nervous/anxious.   All other systems reviewed and are negative.   PAST MEDICAL HISTORY: Past Medical History:  Diagnosis Date    Anemia 02/2013   F/U with Dr. Grayland Ormond for Feraheme injections   Anxiety    Arthritis    Possible RA - Follow up appt with Dr. Jefm Bryant   Cellulitis of arm, right    Diabetes insipidus ([place])    On desmopressin   GERD (gastroesophageal reflux disease)    H/O seasonal allergies    H/O transfusion of packed red blood cells 02/2013   3 units PRBC for severe anemia 02/2013   Herniated disc    History of kidney stones    Hyperlipidemia    Hypertension    Hyperthyroidism    s/p radiation therapy, now hypothyroidism   IBS (irritable bowel syndrome)    Migraine    Pacemaker    a. s/p successful implantation of a Medtronic CapSureFix Novus MRI SureScan serial # [id number] on 06/28/16 by Dr. Caryl Comes for sinus node dysfunction.   Pancreatic cyst    Paroxysmal A-fib [facility])    Renal cell cancer, right ([place]) 10/23/2015   Right partial nephrectomy.    PAST SURGICAL HISTORY: Past Surgical History:  Procedure Laterality Date   ABDOMINAL HYSTERECTOMY     BREAST EXCISIONAL BIOPSY Right 2005   neg   BREAST SURGERY  1990s   Biopsy   CERVICAL SPINE SURGERY     Bone fusion   COLONOSCOPY  2008?   [place]   COLONOSCOPY WITH PROPOFOL N/A 10/15/2016   Procedure: COLONOSCOPY WITH PROPOFOL;  Surgeon: Robert Bellow, MD;  Location: [facility];  Service: Endoscopy;  Laterality: N/A;   EP IMPLANTABLE DEVICE N/A 06/28/2016   Procedure: Pacemaker Implant;  Surgeon: Deboraha Sprang, MD;  Location: [place] CV LAB;  Service: Cardiovascular;  Laterality: N/A;   HAMMER TOE SURGERY     HERNIA REPAIR Left    Inguinal Hernia Repair   PORTA CATH INSERTION N/A 07/24/2017   Procedure: PORTA CATH INSERTION;  Surgeon: Algernon Huxley, MD;  Location: [place] [facility] LAB;  Service: Cardiovascular;  Laterality: N/A;   ROBOTIC ASSITED PARTIAL NEPHRECTOMY Right 10/23/2015   Procedure: ROBOTIC ASSITED PARTIAL NEPHRECTOMY with intraop ultrasound;  Surgeon: Hollice Espy, MD;   Location: [facility] ORS;  Service: Urology;  Laterality: Right;   TUBAL LIGATION      FAMILY HISTORY Family History  Problem Relation Age of Onset   Heart disease Mother    Heart disease Father    Diabetes Brother    Kidney disease Brother    Stroke Daughter        due to medication reaction   Breast cancer Cousin    Prostate cancer Neg Hx    Hematuria Neg Hx     ADVANCED DIRECTIVES:  No flowsheet data found.  HEALTH MAINTENANCE: Social History   Tobacco Use   Smoking status: Former Smoker    Packs/day: 0.50    Years: 30.00    Pack years: 15.00    Types: Cigarettes    Last attempt to quit: 12/03/2012    Years since quitting: 5.1   Smokeless tobacco: Never Used  Substance Use Topics   Alcohol use: No    Alcohol/week: 0.0 oz   Drug use: No     Allergies  Allergen Reactions   Metrizamide     Other reaction(s): Other (See Comments)   Adhesive [Tape] Other (See Comments)    "irritate skin"   Augmentin [Amoxicillin-Pot Clavulanate] Diarrhea    Severe diarrhea   Codeine     REACTION: NAUSEA   Morphine And Related Other (See Comments)    Shut down her organs   Other     Skin irritation   Tetanus Toxoid     REACTION: ARM SWELLING,REDNESS   Tramadol Nausea And Vomiting    Current Outpatient Medications  Medication Sig Dispense Refill   acetaminophen (TYLENOL) 325 MG tablet Take 2 tablets (650 mg total) by mouth every 6 (six) hours as needed for pain. (Patient taking differently: Take 650 mg by mouth every 6 (six) hours as needed for pain. )     albuterol (PROVENTIL HFA;VENTOLIN HFA) 108 (90 Base) MCG/ACT inhaler Inhale 2 puffs into the lungs every 6 (six) hours as needed for wheezing or shortness of breath. 1 Inhaler 2   ALPRAZolam (XANAX) 0.25 MG tablet TAKE ONE TABLET BY MOUTH TWICE A DAY AS NEEDED FOR ANXIETY 60 tablet 0   brimonidine (ALPHAGAN) 0.2 % ophthalmic solution Place 1 drop into both eyes 2 (two) times daily.     Calcium  Carbonate-Vitamin D (CALCIUM-CARB 600 + D) 600-125 MG-UNIT TABS Take 2 tablets by mouth daily.     carvedilol (COREG) 12.5 MG tablet Take 1 tablet (12.5 mg total) by mouth 2 (two) times daily. 30 tablet 1   cholecalciferol (VITAMIN D) 1000 units tablet Take 1,000 Units by mouth daily.     dorzolamide (TRUSOPT) 2 % ophthalmic solution Place 1 drop into both eyes 2 (two) times daily.      ferrous sulfate 325 (65 FE) MG tablet Take 325 mg by mouth daily with breakfast.     furosemide (LASIX) 20 MG tablet Take 0.5 tablets (10 mg total) by mouth daily. 45 tablet 4   gabapentin (NEURONTIN) 300 MG capsule TAKE ONE CAPSULE BY MOUTH THREE TIMES  daily for neuropathy (Patient taking differently: Take 300 mg by mouth 3 (three) times daily. ) 90 capsule 5   hydroxychloroquine (PLAQUENIL) 200 MG tablet Take 400 mg by mouth daily.      latanoprost (XALATAN) 0.005 % ophthalmic solution Place 1 drop into both eyes at bedtime.      levothyroxine (SYNTHROID, LEVOTHROID) 112 MCG tablet Take 1 tablet (112 mcg total) by mouth daily with breakfast. 90 tablet 1   lidocaine-prilocaine (EMLA) cream Apply to affected area once 30 g 3   losartan (COZAAR) 50 MG tablet Take 1 tablet (50 mg total) by mouth daily. 30 tablet 0   mirabegron ER (MYRBETRIQ) 50 MG TB24 tablet Take 1 tablet (50 mg total) by mouth daily. 30 tablet 11   mirtazapine (REMERON) 15 MG tablet Take 1 tablet (15 mg total) by mouth at bedtime. 30 tablet 1   nitroGLYCERIN (NITROLINGUAL) 0.4 MG/SPRAY spray Place 1 spray under the tongue every 5 (five) minutes x 3 doses as needed for chest pain. 4.9 g 0   Omega-3 Fatty Acids (FISH OIL) 1000 MG CAPS Take 1,000 mg by mouth daily.      ondansetron (ZOFRAN) 8 MG tablet Take 1 tablet (8 mg total) by mouth 2 (two) times daily as needed for refractory nausea / vomiting. (Patient taking differently: Take 8 mg by mouth 2 (two) times daily as needed for refractory nausea / vomiting. For refractory  nausea/vomiting) 30 tablet 2   pantoprazole (PROTONIX) 40 MG tablet Take 40 mg by mouth daily.     potassium chloride SA (K-DUR,KLOR-CON) 20 MEQ tablet Take 1 tablet (20 mEq total) by mouth daily. 30 tablet 3   prochlorperazine (COMPAZINE) 10 MG tablet Take 1 tablet (10 mg total) by mouth every 6 (six) hours as needed (Nausea or vomiting). (Patient taking differently: Take 10 mg by mouth every 6 (six) hours as needed (Nausea or vomiting). For nausea/vomiting) 60 tablet 2   timolol (TIMOPTIC) 0.5 % ophthalmic solution Place 1 drop into both eyes 2 (two) times daily.      vitamin C (ASCORBIC ACID) 500 MG tablet Take 500 mg by mouth daily.     No current facility-administered medications for this visit.     OBJECTIVE:  There were no vitals filed for this visit.   There is no height or weight on file to calculate BMI.    ECOG FS:0 - Asymptomatic  Physical Exam   General: Thin, no acute distress.  Sitting in a wheelchair. Eyes: Pink conjunctiva, anicteric sclera. Lungs: Diminished breath sounds. Heart: Regular rate and rhythm. No rubs, murmurs, or gallops. Abdomen: Soft, nontender, nondistended. No organomegaly noted, normoactive bowel sounds. Musculoskeletal: No edema, cyanosis, or clubbing. Neuro: Alert, answering all questions appropriately. Cranial nerves grossly intact. Skin: No rashes or petechiae noted. Psych: Normal affect.  LAB RESULTS:  Appointment on 01/14/2018  Component Date Value Ref Range Status   Iron 01/14/2018 46  28 - 170 ug/dL Final   TIBC 01/14/2018 272  250 - 450 ug/dL Final   Saturation Ratios 01/14/2018 17  10.4 - 31.8 % Final   UIBC 01/14/2018 226  ug/dL Final   Performed at [facility], [address] [place], [place] [address]   Ferritin 01/14/2018 112  11 - 307 ng/mL Final   Performed at [facility], [address], [place], [place] [address]  Hospital Outpatient Visit on 01/12/2018  Component Date Value Ref Range Status     Glucose-Capillary 01/12/2018 84  65 - 99 mg/dL  Final  Infusion on 01/12/2018  Component Date Value Ref Range Status   Sodium 01/12/2018 142  135 - 145 mmol/L Final   Potassium 01/12/2018 3.8  3.5 - 5.1 mmol/L Final   Chloride 01/12/2018 108  101 - 111 mmol/L Final   CO2 01/12/2018 25  22 - 32 mmol/L Final   Glucose, Bld 01/12/2018 91  65 - 99 mg/dL Final   BUN 01/12/2018 15  6 - 20 mg/dL Final   Creatinine, Ser 01/12/2018 0.76  0.44 - 1.00 mg/dL Final   Calcium 01/12/2018 9.1  8.9 - 10.3 mg/dL Final   Total Protein 01/12/2018 7.7  6.5 - 8.1 g/dL Final   Albumin 01/12/2018 3.5  3.5 - 5.0 g/dL Final   AST 01/12/2018 21  15 - 41 U/L Final   ALT 01/12/2018 12* 14 - 54 U/L Final   Alkaline Phosphatase 01/12/2018 49  38 - 126 U/L Final   Total Bilirubin 01/12/2018 0.7  0.3 - 1.2 mg/dL Final   GFR calc non Af Amer 01/12/2018 >60  >60 mL/min Final   GFR calc Af Amer 01/12/2018 >60  >60 mL/min Final   Comment: (NOTE) The eGFR has been calculated using the CKD EPI equation. This calculation has not been validated in all clinical situations. eGFR's persistently <60 mL/min signify possible Chronic Kidney Disease.    Anion gap 01/12/2018 9  5 - 15 Final   Performed at [facility], [address] [place], [place] [address]       STUDIES: Dg Chest 2 View  Result Date: 12/31/2017 CLINICAL DATA:  New onset right-sided chest pain EXAM: CHEST - 2 VIEW COMPARISON:  12/19/2017 FINDINGS: Cardiac shadow is mildly enlarged. Pacing device is again seen. Right chest wall port is again seen and stable. Persistent left retrocardiac infiltrate is noted although overall improved when compared with the prior exam. No sizable effusion is noted. No other focal abnormality is seen. IMPRESSION: Persistent but improved right lower lobe infiltrate when compared with the prior CT. No new focal abnormality is noted. Electronically Signed   By: Inez Catalina M.D.   On: 12/31/2017 14:35   Nm  Pet Image Restag (ps) Skull Base To Thigh  Result Date: 01/12/2018 CLINICAL DATA:  Subsequent treatment strategy for left lower lobe large cell neuroendocrine tumor. History of partial right nephrectomy for renal cell carcinoma. EXAM: NUCLEAR MEDICINE PET SKULL BASE TO THIGH TECHNIQUE: 6.95 mCi F-18 FDG was injected intravenously. Full-ring PET imaging was performed from the skull base to thigh after the radiotracer. CT data was obtained and used for attenuation correction and anatomic localization. Fasting blood glucose: 84 mg/dl COMPARISON:  Chest CT 12/18/2017.  PET-CT 10/21/2017. FINDINGS: Mediastinal blood pool activity: SUV max 1.6 NECK: No hypermetabolic cervical lymph nodes are identified.There are no lesions of the pharyngeal mucosal space. Incidental CT findings: Bilateral carotid atherosclerosis. CHEST: There are no hypermetabolic mediastinal, hilar or axillary lymph nodes. There is mildly increased metabolic activity in the right infrahilar region (SUV max 4.8) without corresponding focal abnormality. There has been partial clearing of the dense left lower lobe airspace disease demonstrated on the chest CT 12/18/2017. There is mild patchy hypermetabolic activity within the consolidated lung within SUV max of 5.7. There is new airspace disease in the lingula which is also mildly hypermetabolic. This has an SUV max of 5.9 centrally adjacent to the hilum and 5.5 peripherally. Incidental CT findings: Right IJ central venous catheter, cardiac pacemaker and diffuse atherosclerosis again noted. No significant pleural or pericardial  effusion. ABDOMEN/PELVIS: There is no hypermetabolic activity within the liver, adrenal glands, spleen or pancreas. There is no hypermetabolic nodal activity. Incidental CT findings: There are stable postsurgical changes in the lower pole of the right kidney, aortic and branch vessel atherosclerosis and sigmoid diverticulosis. SKELETON: There is no hypermetabolic activity to  suggest osseous metastatic disease. Incidental CT findings: Mild multilevel spondylosis. IMPRESSION: 1. No findings highly suspicious for local recurrence or metastatic disease. 2. The left lower lobe consolidation seen on the recent chest CT has partially cleared. There is new patchy airspace opacity in the lingula. There is low-level hypermetabolic activity in these areas, again most consistent with inflammation, possibly related to radiation or infection. Chest CT follow-up in 3-6 months recommended. 3. Stable postsurgical changes in the right kidney. Electronically Signed   By: Richardean Sale M.D.   On: 01/12/2018 14:20   Dg Chest Portable 1 View  Result Date: 12/19/2017 CLINICAL DATA:  Shortness of breath, dyspnea, oxygen desaturation EXAM: PORTABLE CHEST 1 VIEW COMPARISON:  Portable exam 0910 hours compared to 12/18/2016 FINDINGS: RIGHT jugular Port-A-Cath with tip projecting over SVC. Sequential LEFT subclavian transvenous pacemaker with leads projecting over RIGHT atrium and RIGHT ventricle. Enlargement of cardiac silhouette with pulmonary vascular congestion. Mediastinal contours normal. Consolidation LEFT lower lobe consistent with pneumonia though aspiration could give a similar appearance. Remaining lungs clear. No pleural effusion or pneumothorax. Bones demineralized. IMPRESSION: Enlargement of cardiac silhouette. LEFT lower lobe consolidation consistent with pneumonia though aspiration could cause a similar appearance. Electronically Signed   By: Lavonia Dana M.D.   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  Plan:  1. 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  Patient completed cycle 3 of carboplatinum and etoposide on September 19, 2017.  She also completed XRT.  Given her persistent pancytopenia, treatment was discontinued.  Restaging PET  scan from January 12, 2018 reviewed independently and reported as above with no findings to suggest recurrent or metastatic disease.  No further intervention is needed at this time.  Return to clinic in 6 months with repeat imaging using CT scan and further evaluation.   2.  Pancytopenia: Improving.  Previously, bone marrow biopsy revealed no evidence of MDS or other abnormality.  It is possible it is related to her Plaquenil or rheumatoid arthritis. The remainder of her laboratory work was either negative or within normal limits.   3. Renal cell carcinoma of the right kidney: Status post partial nephrectomy.  CT scan as above.  Continue follow-up with urology as indicated. 4.  Rheumatoid arthritis: Continue Plaquenil. Continue follow-up per rheumatology.  5.  Anemia: Patient's hemoglobin is decreased, but stable.  Her iron stores from January 14, 2018 are within normal limits. 6.  Oxygen saturations: Decreased to 83% while ambulation.  A prescription was sent for home oxygen.  Approximately 30 minutes was spent in discussion of which greater than 50% was consultation.  Patient expressed understanding and was in agreement with this plan. She also understands that She can call clinic at any time with any questions, concerns, or complaints.    Lloyd Huger, MD   01/18/2018 6:44 AM

## 2018-01-11 NOTE — Assessment & Plan Note (Addendum)
Has been occurring since mid May.  Alternates with solid stools.  No weight loss.  No leukocytosis (pancytopenia has been resolved for month )  . Recommend trial of Imodium

## 2018-01-11 NOTE — Assessment & Plan Note (Addendum)
History and labs suggest hypovolemia due to diarrhea.  I have refuced her losartan dose to 50 mg daily , her carvedilol to 6 mg bid and her diuretic to 1/2 tablet every other day .

## 2018-01-11 NOTE — Assessment & Plan Note (Addendum)
Etiology unclear.  Her lung exam and chest x ray have improved since discharge .  She denies cough and dysuria..  Although she is having loose stools, they alternate with solid stools so c dificile or any other form of infectious colitis is less likely.  Her symptoms improve with eating, suggesting  hypoglycemia,  And a one hour post prandial cbg here in the office is 77.  Question whether this could be a paraneoplastic syndrome given her normal A1c,  Elevated c peptide and normal random insulin level . Random cortisol level is normal.  Patient was given a glucometer and instructed on how to use it.  Referral to Endocrinology plann.

## 2018-01-12 ENCOUNTER — Inpatient Hospital Stay: Payer: Medicare Other | Attending: Oncology

## 2018-01-12 ENCOUNTER — Ambulatory Visit
Admission: RE | Admit: 2018-01-12 | Discharge: 2018-01-12 | Disposition: A | Discharge status: Home / Self Care | Payer: Medicare Other | Source: Ambulatory Visit | Attending: Oncology | Admitting: Oncology

## 2018-01-12 DIAGNOSIS — R5381 Other malaise: Secondary | ICD-10-CM | POA: Insufficient documentation

## 2018-01-12 DIAGNOSIS — Z79899 Other long term (current) drug therapy: Secondary | ICD-10-CM | POA: Insufficient documentation

## 2018-01-12 DIAGNOSIS — R0602 Shortness of breath: Secondary | ICD-10-CM | POA: Diagnosis not present

## 2018-01-12 DIAGNOSIS — C7A1 Malignant poorly differentiated neuroendocrine tumors: Secondary | ICD-10-CM | POA: Diagnosis not present

## 2018-01-12 DIAGNOSIS — E785 Hyperlipidemia, unspecified: Secondary | ICD-10-CM | POA: Diagnosis not present

## 2018-01-12 DIAGNOSIS — M1990 Unspecified osteoarthritis, unspecified site: Secondary | ICD-10-CM | POA: Insufficient documentation

## 2018-01-12 DIAGNOSIS — Z452 Encounter for adjustment and management of vascular access device: Secondary | ICD-10-CM | POA: Insufficient documentation

## 2018-01-12 DIAGNOSIS — I10 Essential (primary) hypertension: Secondary | ICD-10-CM | POA: Diagnosis not present

## 2018-01-12 DIAGNOSIS — C641 Malignant neoplasm of right kidney, except renal pelvis: Secondary | ICD-10-CM | POA: Insufficient documentation

## 2018-01-12 DIAGNOSIS — Z950 Presence of cardiac pacemaker: Secondary | ICD-10-CM | POA: Insufficient documentation

## 2018-01-12 DIAGNOSIS — R531 Weakness: Secondary | ICD-10-CM | POA: Insufficient documentation

## 2018-01-12 DIAGNOSIS — R5383 Other fatigue: Secondary | ICD-10-CM | POA: Diagnosis not present

## 2018-01-12 DIAGNOSIS — I480 Paroxysmal atrial fibrillation: Secondary | ICD-10-CM | POA: Diagnosis not present

## 2018-01-12 DIAGNOSIS — E232 Diabetes insipidus: Secondary | ICD-10-CM | POA: Insufficient documentation

## 2018-01-12 DIAGNOSIS — K219 Gastro-esophageal reflux disease without esophagitis: Secondary | ICD-10-CM | POA: Insufficient documentation

## 2018-01-12 DIAGNOSIS — C7A8 Other malignant neuroendocrine tumors: Secondary | ICD-10-CM

## 2018-01-12 DIAGNOSIS — E039 Hypothyroidism, unspecified: Secondary | ICD-10-CM | POA: Insufficient documentation

## 2018-01-12 DIAGNOSIS — F419 Anxiety disorder, unspecified: Secondary | ICD-10-CM | POA: Insufficient documentation

## 2018-01-12 DIAGNOSIS — Z87891 Personal history of nicotine dependence: Secondary | ICD-10-CM | POA: Insufficient documentation

## 2018-01-12 DIAGNOSIS — D61818 Other pancytopenia: Secondary | ICD-10-CM | POA: Insufficient documentation

## 2018-01-12 LAB — COMPREHENSIVE METABOLIC PANEL
ALT: 12 U/L — AB (ref 14–54)
AST: 21 U/L (ref 15–41)
Albumin: 3.5 g/dL (ref 3.5–5.0)
Alkaline Phosphatase: 49 U/L (ref 38–126)
Anion gap: 9 (ref 5–15)
BUN: 15 mg/dL (ref 6–20)
CALCIUM: 9.1 mg/dL (ref 8.9–10.3)
CHLORIDE: 108 mmol/L (ref 101–111)
CO2: 25 mmol/L (ref 22–32)
CREATININE: 0.76 mg/dL (ref 0.44–1.00)
GFR calc non Af Amer: >60 mL/min (ref >60)
Glucose, Bld: 91 mg/dL (ref 65–99)
Potassium: 3.8 mmol/L (ref 3.5–5.1)
SODIUM: 142 mmol/L (ref 135–145)
Total Bilirubin: 0.7 mg/dL (ref 0.3–1.2)
Total Protein: 7.7 g/dL (ref 6.5–8.1)

## 2018-01-12 LAB — GLUCOSE, CAPILLARY: Glucose-Capillary: 84 mg/dL (ref 65–99)

## 2018-01-12 MED ORDER — SODIUM CHLORIDE 0.9% FLUSH
10.0000 mL | Freq: Once | INTRAVENOUS | Status: AC
  Administered 2018-01-12: 10 mL via INTRAVENOUS
  Filled 2018-01-12: qty 10

## 2018-01-12 MED ORDER — FLUDEOXYGLUCOSE F - 18 (FDG) INJECTION
7.1000 | Freq: Once | INTRAVENOUS | Status: AC | PRN
  Administered 2018-01-12: 7.1 via INTRAVENOUS

## 2018-01-12 MED ORDER — HEPARIN SOD (PORK) LOCK FLUSH 100 UNIT/ML IV SOLN
500.0000 [IU] | Freq: Once | INTRAVENOUS | Status: AC
  Administered 2018-01-12: 500 [IU] via INTRAVENOUS
  Filled 2018-01-12: qty 5

## 2018-01-14 ENCOUNTER — Ambulatory Visit
Admission: RE | Admit: 2018-01-14 | Discharge: 2018-01-14 | Disposition: A | Discharge status: Home / Self Care | Payer: Medicare Other | Source: Ambulatory Visit | Attending: Radiation Oncology | Admitting: Radiation Oncology

## 2018-01-14 ENCOUNTER — Inpatient Hospital Stay (HOSPITAL_BASED_OUTPATIENT_CLINIC_OR_DEPARTMENT_OTHER): Payer: Medicare Other | Admitting: Oncology

## 2018-01-14 ENCOUNTER — Other Ambulatory Visit: Payer: Self-pay

## 2018-01-14 ENCOUNTER — Inpatient Hospital Stay: Payer: Medicare Other

## 2018-01-14 ENCOUNTER — Encounter: Payer: Self-pay | Admitting: Radiation Oncology

## 2018-01-14 VITALS — BP 148/86 | HR 87 | Temp 97.2°F | Resp 18 | Wt 137.5 lb

## 2018-01-14 DIAGNOSIS — Z79899 Other long term (current) drug therapy: Secondary | ICD-10-CM

## 2018-01-14 DIAGNOSIS — K219 Gastro-esophageal reflux disease without esophagitis: Secondary | ICD-10-CM

## 2018-01-14 DIAGNOSIS — E785 Hyperlipidemia, unspecified: Secondary | ICD-10-CM

## 2018-01-14 DIAGNOSIS — C7A1 Malignant poorly differentiated neuroendocrine tumors: Secondary | ICD-10-CM | POA: Insufficient documentation

## 2018-01-14 DIAGNOSIS — R5383 Other fatigue: Secondary | ICD-10-CM

## 2018-01-14 DIAGNOSIS — Z87891 Personal history of nicotine dependence: Secondary | ICD-10-CM

## 2018-01-14 DIAGNOSIS — R0602 Shortness of breath: Secondary | ICD-10-CM | POA: Diagnosis not present

## 2018-01-14 DIAGNOSIS — E039 Hypothyroidism, unspecified: Secondary | ICD-10-CM | POA: Diagnosis not present

## 2018-01-14 DIAGNOSIS — R531 Weakness: Secondary | ICD-10-CM | POA: Diagnosis not present

## 2018-01-14 DIAGNOSIS — C7A8 Other malignant neuroendocrine tumors: Secondary | ICD-10-CM | POA: Diagnosis not present

## 2018-01-14 DIAGNOSIS — M1990 Unspecified osteoarthritis, unspecified site: Secondary | ICD-10-CM

## 2018-01-14 DIAGNOSIS — E232 Diabetes insipidus: Secondary | ICD-10-CM

## 2018-01-14 DIAGNOSIS — Z950 Presence of cardiac pacemaker: Secondary | ICD-10-CM

## 2018-01-14 DIAGNOSIS — F419 Anxiety disorder, unspecified: Secondary | ICD-10-CM | POA: Diagnosis not present

## 2018-01-14 DIAGNOSIS — I480 Paroxysmal atrial fibrillation: Secondary | ICD-10-CM

## 2018-01-14 DIAGNOSIS — R5381 Other malaise: Secondary | ICD-10-CM

## 2018-01-14 DIAGNOSIS — D61818 Other pancytopenia: Secondary | ICD-10-CM | POA: Diagnosis not present

## 2018-01-14 DIAGNOSIS — Z452 Encounter for adjustment and management of vascular access device: Secondary | ICD-10-CM | POA: Diagnosis not present

## 2018-01-14 DIAGNOSIS — I10 Essential (primary) hypertension: Secondary | ICD-10-CM | POA: Diagnosis not present

## 2018-01-14 DIAGNOSIS — C641 Malignant neoplasm of right kidney, except renal pelvis: Secondary | ICD-10-CM

## 2018-01-14 DIAGNOSIS — Z923 Personal history of irradiation: Secondary | ICD-10-CM | POA: Diagnosis not present

## 2018-01-14 LAB — IRON AND TIBC
Iron: 46 ug/dL (ref 28–170)
Saturation Ratios: 17 % (ref 10.4–31.8)
TIBC: 272 ug/dL (ref 250–450)
UIBC: 226 ug/dL

## 2018-01-14 LAB — FERRITIN: FERRITIN: 112 ng/mL (ref 11–307)

## 2018-01-14 NOTE — Progress Notes (Signed)
Radiation Oncology Follow up Note  Name: Caroline Nichols   Date:   01/14/2018 MRN:  [id number] DOB: October 20, 1940    This 77 y.o. female presents to the clinic today for four-month follow-up status post hyperfractionated course of radiation therapy to her left lung for stage IIB large cell neuroendocrine carcinoma.  REFERRING PROVIDER: [name], MD  HPI: patient is a 77 year old female now out 4 months having completed IM RT radiation therapy to her left lung for stage IIB large cell neuroendocrine carcinoma. Seen today in routine follow-up she is doing fair. She's had no oxygen ordered for her as not yet received that. She states she does have some increased shortness of breath and dyspnea on exertion..she did have a recent PET CT scan this month showing no findings highly suspicious for local recurrence or metastatic disease. Left lower lobe consolidation on recent CT chest CT has partially cleared. She specifically denies cough hemoptysis or chest tightness.medical oncology is following her for pancytopenia of unknown etiology.  COMPLICATIONS OF TREATMENT: none  FOLLOW UP COMPLIANCE: keeps appointments   PHYSICAL EXAM:  BP (!) 148/86   Pulse 87   Temp (!) 97.2 F (36.2 C)   Resp 18   Wt 137 lb 7.3 oz (62.4 kg)   BMI 25.14 kg/m  Frail-appearing elderly female in NAD. Well-developed well-nourished patient in NAD. HEENT reveals PERLA, EOMI, discs not visualized.  Oral cavity is clear. No oral mucosal lesions are identified. Neck is clear without evidence of cervical or supraclavicular adenopathy. Lungs are clear to A&P. Cardiac examination is essentially unremarkable with regular rate and rhythm without murmur rub or thrill. Abdomen is benign with no organomegaly or masses noted. Motor sensory and DTR levels are equal and symmetric in the upper and lower extremities. Cranial nerves II through XII are grossly intact. Proprioception is intact. No peripheral adenopathy or edema is  identified. No motor or sensory levels are noted. Crude visual fields are within normal range.  RADIOLOGY RESULTS: PET CT scan reviewed and compatible above-stated findings  PLAN: resent time patient is doing well with excellent results by PET CT criteria. I'm please were overall progress. She will follow through and obtain her nasal oxygen. Otherwise I'm please were overall progress I've asked to see her back in 6 months for follow-up. Patient is to call with any concerns.  I would like to take this opportunity to thank you for allowing me to participate in the care of your patient.Noreene Filbert, MD

## 2018-01-14 NOTE — Progress Notes (Signed)
Patient here today for follow up and PET results. Patient reports weakness and worsening shortness of breath. Patients O2 saturation on RA at rest 96%; Ambulating on RA 83%; on 2 Liters O2 at rest 97%; 2 Liters O2 while ambulating 94%.

## 2018-01-16 ENCOUNTER — Ambulatory Visit: Payer: Medicare Other | Admitting: Internal Medicine

## 2018-01-20 NOTE — Telephone Encounter (Signed)
Called pt to touch base with her on the oxygen we had ordered for her since I had just spoken with [employer] again. The pt informed me that she saw Dr. Bernerd Limbo last week and they were able to get her the oxygen. Then I called [employer] back to let them know they could cancel the order.

## 2018-01-26 DIAGNOSIS — Z950 Presence of cardiac pacemaker: Secondary | ICD-10-CM | POA: Diagnosis not present

## 2018-01-26 DIAGNOSIS — F419 Anxiety disorder, unspecified: Secondary | ICD-10-CM | POA: Diagnosis not present

## 2018-01-26 DIAGNOSIS — C7A8 Other malignant neuroendocrine tumors: Secondary | ICD-10-CM | POA: Diagnosis not present

## 2018-01-26 DIAGNOSIS — E890 Postprocedural hypothyroidism: Secondary | ICD-10-CM | POA: Diagnosis not present

## 2018-01-26 DIAGNOSIS — C641 Malignant neoplasm of right kidney, except renal pelvis: Secondary | ICD-10-CM | POA: Diagnosis not present

## 2018-01-26 DIAGNOSIS — I10 Essential (primary) hypertension: Secondary | ICD-10-CM | POA: Diagnosis not present

## 2018-01-26 DIAGNOSIS — M0579 Rheumatoid arthritis with rheumatoid factor of multiple sites without organ or systems involvement: Secondary | ICD-10-CM | POA: Diagnosis not present

## 2018-01-28 ENCOUNTER — Ambulatory Visit: Payer: Self-pay | Admitting: Internal Medicine

## 2018-01-28 NOTE — Telephone Encounter (Signed)
Call returned to pt.  Stated she had question about dose of Losartan.  Stated she picked it up, at pharmacy, recently, and noticed that the dose had been changed from Losartan 100 mg daily, to 50 mg. daily.  Reviewed last office note of 01/09/18.  Advised that Dr. Derrel Nip decreased the Losartan to 50 mg. at that time, due to low BP.  Also advised that the Carvedilol is to decrease to 6.125 mg BID, and Furosemide 20 mg, 1/2 tab, to decrease to every other day, until diarrhea resolves.  Pt. verb. understanding of medication dose instructions.  Encouraged to call back, if any other questions.  Agreed with plan.             Reason for Disposition . Caller has medication question only, adult not sick, and triager answers question  Answer Assessment - Initial Assessment Questions 1. SYMPTOMS: "Do you have any symptoms?"     no 2. SEVERITY: If symptoms are present, ask "Are they mild, moderate or severe?"     N/a;   Pt. Requested clarification on dose of Losartan.  Protocols used: MEDICATION QUESTION CALL-A-AH

## 2018-01-30 ENCOUNTER — Other Ambulatory Visit: Payer: Self-pay | Admitting: Internal Medicine

## 2018-01-30 NOTE — Telephone Encounter (Signed)
Copied from [place] [id number]. Topic: Quick Communication - See Telephone Encounter >> Jan 30, 2018 11:11 AM Ahmed Prima L wrote: CRM for notification. See Telephone encounter for: 01/30/18.  Kristopher Oppenheim called and said that she contacted the pharmacy on Tuesday for a refill on ALPRAZolam (XANAX) 0.25 MG tablet. They said they just forgot to call the office, not the patients fault. She will be out tomorrow. They would like to know could it be called in today, please.  [place], [place]

## 2018-01-30 NOTE — Telephone Encounter (Signed)
Patient was last seen on 01-09-18 and this was last filled on 01-01-18. Rx request

## 2018-01-30 NOTE — Telephone Encounter (Signed)
Refilled: 01/01/2018 Last OV: 01/09/2018 Next OV: 02/18/2018

## 2018-01-31 MED ORDER — ALPRAZOLAM 0.25 MG PO TABS: ORAL_TABLET | ORAL | 5 refills | Status: DC

## 2018-01-31 NOTE — Telephone Encounter (Signed)
Ok to refill 

## 2018-02-02 ENCOUNTER — Telehealth: Payer: Self-pay

## 2018-02-02 NOTE — Telephone Encounter (Signed)
Copied from [place] [phone]. Topic: Quick Communication - See Telephone Encounter >> Jan 30, 2018 11:11 AM Ahmed Prima L wrote: CRM for notification. See Telephone encounter for: 01/30/18.  Kristopher Oppenheim called and said that she contacted the pharmacy on Tuesday for a refill on ALPRAZolam (XANAX) 0.25 MG tablet. They said they just forgot to call the office, not the patients fault. She will be out tomorrow. They would like to know could it be called in today, please.  [place], [place] >> Feb 02, 2018  4:56 PM Brayton El, Edwena Blow A wrote: Pt daughter called in and stated that pharmacy did not get this med.  They checked today and it wasn't there.  Can this be sent ?

## 2018-02-03 DIAGNOSIS — Z885 Allergy status to narcotic agent status: Secondary | ICD-10-CM | POA: Diagnosis not present

## 2018-02-03 DIAGNOSIS — H401133 Primary open-angle glaucoma, bilateral, severe stage: Secondary | ICD-10-CM | POA: Diagnosis not present

## 2018-02-03 DIAGNOSIS — F17210 Nicotine dependence, cigarettes, uncomplicated: Secondary | ICD-10-CM | POA: Diagnosis not present

## 2018-02-03 DIAGNOSIS — I10 Essential (primary) hypertension: Secondary | ICD-10-CM | POA: Diagnosis not present

## 2018-02-03 DIAGNOSIS — H2512 Age-related nuclear cataract, left eye: Secondary | ICD-10-CM | POA: Diagnosis not present

## 2018-02-03 NOTE — Telephone Encounter (Signed)
Rx was faxed. Called pharmacy to verify that they had received the refill. Pharmacist stated that they have it. Pt's daughter was notified that rx is ready to be picked up.

## 2018-02-03 NOTE — Telephone Encounter (Signed)
Pt's daughter was notified that rx has been refilled and is ready at the pharmacy to be picked up.

## 2018-02-18 ENCOUNTER — Encounter: Payer: Self-pay | Admitting: Internal Medicine

## 2018-02-18 ENCOUNTER — Ambulatory Visit (INDEPENDENT_AMBULATORY_CARE_PROVIDER_SITE_OTHER): Payer: Medicare Other | Admitting: Internal Medicine

## 2018-02-18 VITALS — BP 106/70 | HR 59 | Temp 98.5°F | Resp 15 | Ht 62.0 in | Wt 137.0 lb

## 2018-02-18 DIAGNOSIS — E861 Hypovolemia: Secondary | ICD-10-CM

## 2018-02-18 DIAGNOSIS — E162 Hypoglycemia, unspecified: Secondary | ICD-10-CM | POA: Diagnosis not present

## 2018-02-18 DIAGNOSIS — K2100 Gastro-esophageal reflux disease with esophagitis, without bleeding: Secondary | ICD-10-CM

## 2018-02-18 DIAGNOSIS — I9589 Other hypotension: Secondary | ICD-10-CM

## 2018-02-18 DIAGNOSIS — E538 Deficiency of other specified B group vitamins: Secondary | ICD-10-CM | POA: Diagnosis not present

## 2018-02-18 DIAGNOSIS — J9611 Chronic respiratory failure with hypoxia: Secondary | ICD-10-CM

## 2018-02-18 MED ORDER — CARVEDILOL 6.25 MG PO TABS: 6.2500 mg | ORAL_TABLET | Freq: Two times a day (BID) | ORAL | 2 refills | Status: DC

## 2018-02-18 MED ORDER — FUROSEMIDE 20 MG PO TABS: 10.0000 mg | ORAL_TABLET | Freq: Every day | ORAL | 4 refills | Status: DC

## 2018-02-18 MED ORDER — CYANOCOBALAMIN 1000 MCG/ML IJ SOLN
1000.0000 ug | Freq: Once | INTRAMUSCULAR | Status: AC
  Administered 2018-02-18: 1000 ug via INTRAMUSCULAR

## 2018-02-18 NOTE — Progress Notes (Signed)
Subjective:  Patient ID: SHA BURLING, female    DOB: [birth year]  Age: 77 y.o. MRN: [id number]  CC: The primary encounter diagnosis was Hypoglycemia. Diagnoses of B12 deficiency, Hypotension due to hypovolemia, Chronic respiratory failure with hypoxia ([place]), and Gastroesophageal reflux disease with esophagitis were also pertinent to this visit.  HPI Caroline Nichols presents for follow up on multiple issues  Hypotension:  At her last visit BP was low and Carvedilol dose was reduced last month,  Needs new rx for lower dose   She underwent Cataract surgery with lens placement by  [facility] July 2 .  Post op visits done,  Increased eye pressure due to suspension of eye drops for  Glaucoma: [facility] restarted eye drops vigamox and prednisolone taper dose,  With 3 week follow up due   Saw ONc for follow up on neuroendocrine CA  Of left lung stage 2 b,  , panctyopenia and renal cell CA kidney .   Hypoxia with ambulation noted at visit  in May and supplmental 02 ordered.  Feeling a little better,  02 requirement  Has improved and oxygen use reduced to nighttime   Post prandial hypoglycemia noted at last visit here. 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  She has had 3 similar episodes that were quickly relieved by eating a snack . Now eating a snack before bedtime.      Had a delay in alprazolam refill despite requesting refill 5 days in advance with multiple calls to pharmacy and finally to office. Ran out  In spite of her efforts.  No withdrawal symptoms  .  Picked up July 2   Appetite improving; her daughter  Is doing some  cooking for her since  the meals provided at [id number] are either too spicy or poorly  Prepared .  daughter  has complained to the administration, but thus far there has been no improvement :  Too spicy,  Undercooked chicken ;  Lack of vegetable variety:   Serves blanched  green beans,  Broccoli,   Too much tomato paste aggravates her  GERD.     Current Outpatient Medications on File Prior to Visit  Medication Sig Dispense Refill  . acetaminophen (TYLENOL) 325 MG tablet Take 2 tablets (650 mg total) by mouth every 6 (six) hours as needed for pain. (Patient taking differently: Take 650 mg by mouth every 6 (six) hours as needed for pain. )    . albuterol (PROVENTIL HFA;VENTOLIN HFA) 108 (90 Base) MCG/ACT inhaler Inhale 2 puffs into the lungs every 6 (six) hours as needed for wheezing or shortness of breath. 1 Inhaler 2  . ALPRAZolam (XANAX) 0.25 MG tablet TAKE ONE TABLET BY MOUTH TWICE A DAY AS NEEDED FOR ANXIETY 60 tablet 5  . brimonidine (ALPHAGAN) 0.2 % ophthalmic solution Place 1 drop into both eyes 2 (two) times daily.    . Calcium Carbonate-Vitamin D (CALCIUM-CARB 600 + D) 600-125 MG-UNIT TABS Take 2 tablets by mouth daily.    . cholecalciferol (VITAMIN D) 1000 units tablet Take 1,000 Units by mouth daily.    . dorzolamide (TRUSOPT) 2 % ophthalmic solution Place 1 drop into both eyes 2 (two) times daily.     . ferrous sulfate 325 (65 FE) MG tablet Take 325 mg by mouth daily with breakfast.    . gabapentin (NEURONTIN) 300 MG capsule  TAKE ONE CAPSULE BY MOUTH THREE TIMES daily for neuropathy (Patient taking differently: Take 300 mg by mouth 3 (three) times daily. ) 90 capsule 5  . hydroxychloroquine (PLAQUENIL) 200 MG tablet Take 400 mg by mouth daily.     Marland Kitchen latanoprost (XALATAN) 0.005 % ophthalmic solution Place 1 drop into both eyes at bedtime.     Marland Kitchen levothyroxine (SYNTHROID, LEVOTHROID) 112 MCG tablet Take 1 tablet (112 mcg total) by mouth daily with breakfast. 90 tablet 1  . lidocaine-prilocaine (EMLA) cream Apply to affected area once 30 g 3  . losartan (COZAAR) 50 MG tablet Take 1 tablet (50 mg total) by mouth daily. 30 tablet 0  . mirabegron ER (MYRBETRIQ) 50 MG TB24 tablet Take 1 tablet (50 mg total) by mouth daily. 30 tablet 11  .  mirtazapine (REMERON) 15 MG tablet Take 1 tablet (15 mg total) by mouth at bedtime. 30 tablet 1  . nitroGLYCERIN (NITROLINGUAL) 0.4 MG/SPRAY spray Place 1 spray under the tongue every 5 (five) minutes x 3 doses as needed for chest pain. 4.9 g 0  . Omega-3 Fatty Acids (FISH OIL) 1000 MG CAPS Take 1,000 mg by mouth daily.     . ondansetron (ZOFRAN) 8 MG tablet Take 1 tablet (8 mg total) by mouth 2 (two) times daily as needed for refractory nausea / vomiting. (Patient taking differently: Take 8 mg by mouth 2 (two) times daily as needed for refractory nausea / vomiting. For refractory nausea/vomiting) 30 tablet 2  . pantoprazole (PROTONIX) 40 MG tablet Take 40 mg by mouth daily.    . potassium chloride SA (K-DUR,KLOR-CON) 20 MEQ tablet Take 1 tablet (20 mEq total) by mouth daily. 30 tablet 3  . prochlorperazine (COMPAZINE) 10 MG tablet Take 1 tablet (10 mg total) by mouth every 6 (six) hours as needed (Nausea or vomiting). (Patient taking differently: Take 10 mg by mouth every 6 (six) hours as needed (Nausea or vomiting). For nausea/vomiting) 60 tablet 2  . timolol (TIMOPTIC) 0.5 % ophthalmic solution Place 1 drop into both eyes 2 (two) times daily.     . vitamin C (ASCORBIC ACID) 500 MG tablet Take 500 mg by mouth daily.     No current facility-administered medications on file prior to visit.    ril   Outpatient Medications Prior to Visit  Medication Sig Dispense Refill  . acetaminophen (TYLENOL) 325 MG tablet Take 2 tablets (650 mg total) by mouth every 6 (six) hours as needed for pain. (Patient taking differently: Take 650 mg by mouth every 6 (six) hours as needed for pain. )    . albuterol (PROVENTIL HFA;VENTOLIN HFA) 108 (90 Base) MCG/ACT inhaler Inhale 2 puffs into the lungs every 6 (six) hours as needed for wheezing or shortness of breath. 1 Inhaler 2  . ALPRAZolam (XANAX) 0.25 MG tablet TAKE ONE TABLET BY MOUTH TWICE A DAY AS NEEDED FOR ANXIETY 60 tablet 5  . brimonidine (ALPHAGAN) 0.2 %  ophthalmic solution Place 1 drop into both eyes 2 (two) times daily.    . Calcium Carbonate-Vitamin D (CALCIUM-CARB 600 + D) 600-125 MG-UNIT TABS Take 2 tablets by mouth daily.    . cholecalciferol (VITAMIN D) 1000 units tablet Take 1,000 Units by mouth daily.    . dorzolamide (TRUSOPT) 2 % ophthalmic solution Place 1 drop into both eyes 2 (two) times daily.     . ferrous sulfate 325 (65 FE) MG tablet Take 325 mg by mouth daily with breakfast.    . gabapentin (NEURONTIN) 300  MG capsule TAKE ONE CAPSULE BY MOUTH THREE TIMES daily for neuropathy (Patient taking differently: Take 300 mg by mouth 3 (three) times daily. ) 90 capsule 5  . hydroxychloroquine (PLAQUENIL) 200 MG tablet Take 400 mg by mouth daily.     Marland Kitchen latanoprost (XALATAN) 0.005 % ophthalmic solution Place 1 drop into both eyes at bedtime.     Marland Kitchen levothyroxine (SYNTHROID, LEVOTHROID) 112 MCG tablet Take 1 tablet (112 mcg total) by mouth daily with breakfast. 90 tablet 1  . lidocaine-prilocaine (EMLA) cream Apply to affected area once 30 g 3  . losartan (COZAAR) 50 MG tablet Take 1 tablet (50 mg total) by mouth daily. 30 tablet 0  . mirabegron ER (MYRBETRIQ) 50 MG TB24 tablet Take 1 tablet (50 mg total) by mouth daily. 30 tablet 11  . mirtazapine (REMERON) 15 MG tablet Take 1 tablet (15 mg total) by mouth at bedtime. 30 tablet 1  . nitroGLYCERIN (NITROLINGUAL) 0.4 MG/SPRAY spray Place 1 spray under the tongue every 5 (five) minutes x 3 doses as needed for chest pain. 4.9 g 0  . Omega-3 Fatty Acids (FISH OIL) 1000 MG CAPS Take 1,000 mg by mouth daily.     . ondansetron (ZOFRAN) 8 MG tablet Take 1 tablet (8 mg total) by mouth 2 (two) times daily as needed for refractory nausea / vomiting. (Patient taking differently: Take 8 mg by mouth 2 (two) times daily as needed for refractory nausea / vomiting. For refractory nausea/vomiting) 30 tablet 2  . pantoprazole (PROTONIX) 40 MG tablet Take 40 mg by mouth daily.    . potassium chloride SA  (K-DUR,KLOR-CON) 20 MEQ tablet Take 1 tablet (20 mEq total) by mouth daily. 30 tablet 3  . prochlorperazine (COMPAZINE) 10 MG tablet Take 1 tablet (10 mg total) by mouth every 6 (six) hours as needed (Nausea or vomiting). (Patient taking differently: Take 10 mg by mouth every 6 (six) hours as needed (Nausea or vomiting). For nausea/vomiting) 60 tablet 2  . timolol (TIMOPTIC) 0.5 % ophthalmic solution Place 1 drop into both eyes 2 (two) times daily.     . vitamin C (ASCORBIC ACID) 500 MG tablet Take 500 mg by mouth daily.    . carvedilol (COREG) 12.5 MG tablet Take 1 tablet (12.5 mg total) by mouth 2 (two) times daily. (Patient taking differently: Take 12.5 mg by mouth 2 (two) times daily. ) 30 tablet 1  . furosemide (LASIX) 20 MG tablet Take 0.5 tablets (10 mg total) by mouth daily. (Patient taking differently: Take 10 mg by mouth daily. ) 45 tablet 4   No facility-administered medications prior to visit.     Review of Systems;  Patient denies headache, fevers, malaise, unintentional weight loss, skin rash, eye pain, sinus congestion and sinus pain, sore throat, dysphagia,  hemoptysis , cough, dyspnea, wheezing, chest pain, palpitations, orthopnea, edema, abdominal pain, nausea, melena, diarrhea, constipation, flank pain, dysuria, hematuria, urinary  Frequency, nocturia, numbness, tingling, seizures,  Focal weakness, Loss of consciousness,  Tremor, insomnia, depression, anxiety, and suicidal ideation.      Objective:  BP 106/70 (BP Location: Left Arm, Patient Position: Sitting, Cuff Size: Normal)   Pulse (!) 59   Temp 98.5 F (36.9 C) (Oral)   Resp 15   Ht 5\' 2"  (1.575 m)   Wt 137 lb (62.1 kg)   SpO2 96%   BMI 25.06 kg/m   BP Readings from Last 3 Encounters:  02/18/18 106/70  01/14/18 (!) 148/86  01/09/18 (!) 92/58  Wt Readings from Last 3 Encounters:  02/18/18 137 lb (62.1 kg)  01/14/18 137 lb 7.3 oz (62.4 kg)  01/09/18 137 lb 9.6 oz (62.4 kg)    General appearance: alert,  cooperative and appears stated age Ears: normal TM's and external ear canals both ears Throat: lips, mucosa, and tongue normal; teeth and gums normal Neck: no adenopathy, no carotid bruit, supple, symmetrical, trachea midline and thyroid not enlarged, symmetric, no tenderness/mass/nodules Back: symmetric, no curvature. ROM normal. No CVA tenderness. Lungs: clear to auscultation bilaterally Heart: regular rate and rhythm, S1, S2 normal, no murmur, click, rub or gallop Abdomen: soft, non-tender; bowel sounds normal; no masses,  no organomegaly Pulses: 2+ and symmetric Skin: Skin color, texture, turgor normal. No rashes or lesions Lymph nodes: Cervical, supraclavicular, and axillary nodes normal.  Lab Results  Component Value Date   HGBA1C 5.2 01/09/2018   HGBA1C 5.3 06/09/2014   HGBA1C 5.2 12/27/2012    Lab Results  Component Value Date   CREATININE 0.76 01/12/2018   CREATININE 0.78 01/09/2018   CREATININE 0.71 12/31/2017    Lab Results  Component Value Date   WBC 6.7 01/09/2018   HGB 10.0 (L) 01/09/2018   HCT 31.4 (L) 01/09/2018   PLT 144.0 (L) 01/09/2018   GLUCOSE 91 01/12/2018   CHOL 166 11/26/2017   TRIG 78 11/26/2017   HDL 60 11/26/2017   LDLCALC 89 11/26/2017   ALT 12 (L) 01/12/2018   AST 21 01/12/2018   NA 142 01/12/2018   K 3.8 01/12/2018   CL 108 01/12/2018   CREATININE 0.76 01/12/2018   BUN 15 01/12/2018   CO2 25 01/12/2018   TSH 1.19 11/26/2017   INR 1.20 12/19/2017   HGBA1C 5.2 01/09/2018   MICROALBUR 0.7 06/09/2014    No results found.  Assessment & Plan:   Problem List Items Addressed This Visit    Hypotension    Resolved since reducing the dose of carvedilol to 6.25 mg bid . Refills given       Relevant Medications   carvedilol (COREG) 6.25 MG tablet   furosemide (LASIX) 20 MG tablet   GERD (gastroesophageal reflux disease)    Aggravated by the menu choices at [facility]. Daughter is providing most evening meals currently. I have offered  to write a letter on her behalf regarding the quality of the food and the need for less spicy choices      Chronic respiratory failure ([place])    Ambulatory sats on room air today were > 88%.  Continue nocturnal use of oxygen       B12 deficiency   Relevant Medications   cyanocobalamin ((VITAMIN B-12)) injection 1,000 mcg (Completed)    Other Visit Diagnoses    Hypoglycemia    -  Primary   Relevant Orders   Insulin and C-Peptide (Completed)     A total of 25 minutes of face to face time was spent with patient more than half of which was spent in counselling about the above mentioned conditions  and coordination of care  I have changed Silvano Rusk. Schellhorn's carvedilol. I am also having her maintain her Fish Oil, acetaminophen, vitamin C, ferrous sulfate, brimonidine, hydroxychloroquine, latanoprost, dorzolamide, lidocaine-prilocaine, ondansetron, prochlorperazine, potassium chloride SA, timolol, nitroGLYCERIN, gabapentin, levothyroxine, cholecalciferol, pantoprazole, Calcium Carbonate-Vitamin D, albuterol, mirabegron ER, mirtazapine, losartan, ALPRAZolam, and furosemide. We administered cyanocobalamin.  Meds ordered this encounter  Medications  . carvedilol (COREG) 6.25 MG tablet    Sig: Take 1 tablet (6.25 mg total) by mouth 2 (  two) times daily.    Dispense:  180 tablet    Refill:  2  . furosemide (LASIX) 20 MG tablet    Sig: Take 0.5 tablets (10 mg total) by mouth daily.    Dispense:  45 tablet    Refill:  4    Please do the lipid and tsh as well that were ordered previously   . cyanocobalamin ((VITAMIN B-12)) injection 1,000 mcg    Medications Discontinued During This Encounter  Medication Reason  . carvedilol (COREG) 12.5 MG tablet   . furosemide (LASIX) 20 MG tablet Reorder    Follow-up: Return in about 6 months (around 08/21/2018).   [name], MD

## 2018-02-18 NOTE — Patient Instructions (Addendum)
Keep the appointment with Dr Gabriel Carina at the end of the month  Unless you I tell you otherwise   I recommend that you purchase  A pulse oximeter at [employer] to monitor your oxygen levels

## 2018-02-19 LAB — INSULIN AND C-PEPTIDE, SERUM
C PEPTIDE: 4.3 ng/mL (ref 1.1–4.4)
INSULIN: 10.5 u[IU]/mL (ref 2.6–24.9)

## 2018-02-20 DIAGNOSIS — K219 Gastro-esophageal reflux disease without esophagitis: Secondary | ICD-10-CM | POA: Insufficient documentation

## 2018-02-20 NOTE — Assessment & Plan Note (Signed)
Ambulatory sats on room air today were > 88%.  Continue nocturnal use of oxygen

## 2018-02-20 NOTE — Assessment & Plan Note (Signed)
Aggravated by the menu choices at [facility]. Daughter is providing most evening meals currently. I have offered to write a letter on her behalf regarding the quality of the food and the need for less spicy choices

## 2018-02-20 NOTE — Assessment & Plan Note (Signed)
Resolved since reducing the dose of carvedilol to 6.25 mg bid . Refills given

## 2018-02-25 ENCOUNTER — Inpatient Hospital Stay: Payer: Medicare Other

## 2018-02-25 ENCOUNTER — Inpatient Hospital Stay (HOSPITAL_BASED_OUTPATIENT_CLINIC_OR_DEPARTMENT_OTHER): Payer: Medicare Other | Admitting: Oncology

## 2018-02-25 ENCOUNTER — Inpatient Hospital Stay: Payer: Medicare Other | Attending: Oncology

## 2018-02-25 VITALS — BP 160/94 | HR 70 | Temp 96.6°F | Resp 18 | Wt 137.0 lb

## 2018-02-25 DIAGNOSIS — C7A8 Other malignant neuroendocrine tumors: Secondary | ICD-10-CM

## 2018-02-25 DIAGNOSIS — C641 Malignant neoplasm of right kidney, except renal pelvis: Secondary | ICD-10-CM | POA: Insufficient documentation

## 2018-02-25 DIAGNOSIS — K219 Gastro-esophageal reflux disease without esophagitis: Secondary | ICD-10-CM | POA: Insufficient documentation

## 2018-02-25 DIAGNOSIS — Z923 Personal history of irradiation: Secondary | ICD-10-CM

## 2018-02-25 DIAGNOSIS — E232 Diabetes insipidus: Secondary | ICD-10-CM | POA: Insufficient documentation

## 2018-02-25 DIAGNOSIS — I10 Essential (primary) hypertension: Secondary | ICD-10-CM | POA: Diagnosis not present

## 2018-02-25 DIAGNOSIS — E785 Hyperlipidemia, unspecified: Secondary | ICD-10-CM | POA: Insufficient documentation

## 2018-02-25 DIAGNOSIS — F418 Other specified anxiety disorders: Secondary | ICD-10-CM

## 2018-02-25 DIAGNOSIS — M1990 Unspecified osteoarthritis, unspecified site: Secondary | ICD-10-CM | POA: Insufficient documentation

## 2018-02-25 DIAGNOSIS — R5383 Other fatigue: Secondary | ICD-10-CM

## 2018-02-25 DIAGNOSIS — E890 Postprocedural hypothyroidism: Secondary | ICD-10-CM

## 2018-02-25 DIAGNOSIS — R5381 Other malaise: Secondary | ICD-10-CM | POA: Insufficient documentation

## 2018-02-25 DIAGNOSIS — Z950 Presence of cardiac pacemaker: Secondary | ICD-10-CM

## 2018-02-25 DIAGNOSIS — F17210 Nicotine dependence, cigarettes, uncomplicated: Secondary | ICD-10-CM

## 2018-02-25 DIAGNOSIS — Z79899 Other long term (current) drug therapy: Secondary | ICD-10-CM | POA: Insufficient documentation

## 2018-02-25 DIAGNOSIS — C7A090 Malignant carcinoid tumor of the bronchus and lung: Secondary | ICD-10-CM | POA: Insufficient documentation

## 2018-02-25 DIAGNOSIS — R0602 Shortness of breath: Secondary | ICD-10-CM | POA: Insufficient documentation

## 2018-02-25 DIAGNOSIS — I480 Paroxysmal atrial fibrillation: Secondary | ICD-10-CM | POA: Insufficient documentation

## 2018-02-25 MED ORDER — SODIUM CHLORIDE 0.9% FLUSH
10.0000 mL | INTRAVENOUS | Status: DC | PRN
  Administered 2018-02-25: 10 mL via INTRAVENOUS
  Filled 2018-02-25: qty 10

## 2018-02-25 MED ORDER — HEPARIN SOD (PORK) LOCK FLUSH 100 UNIT/ML IV SOLN
500.0000 [IU] | Freq: Once | INTRAVENOUS | Status: AC
  Administered 2018-02-25: 500 [IU] via INTRAVENOUS

## 2018-02-25 NOTE — Progress Notes (Signed)
Survivorship Care Plan visit completed.  Treatment summary reviewed and given to patient.  [facility] answers booklet reviewed and given to patient.  CARE program and Cancer Transitions discussed with patient along with other resources cancer center offers to patients and caregivers.  Patient verbalized understanding.    

## 2018-02-25 NOTE — Progress Notes (Signed)
[place] [facility] [place]  Telephone:(336) [phone] [phone]  Patient Care Team: [name], MD as PCP - General (Internal Medicine) [name], MD (Internal Medicine) [name], [name], MD (General Surgery) [name], RN as Registered Nurse Grayland Ormond, Kathlene November, MD as Consulting Physician (Oncology) Noreene Filbert, MD as Referring Physician (Radiation Oncology)   Name of the patient: Caroline Nichols  [id number]  Jan 09, 1941   Date of visit: 03/26/18  CLINIC:  Survivorship   REASON FOR VISIT:  Survivorship Care Plan visit & to address acute survivorship needs   BRIEF ONCOLOGY HISTORY: Oncology History   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  Patient completed cycle 3 of carboplatinum and etoposide on September 19, 2017.  She also completed XRT.  Given her persistent pancytopenia, treatment was discontinued.  Restaging PET scan from January 12, 2018 reviewed independently and reported as above with no findings to suggest recurrent or metastatic disease       Neuroendocrine carcinoma of lung ([place])   07/16/2017 Initial Diagnosis    Neuroendocrine carcinoma of lung ([place])      INTERVAL HISTORY: Patient was last seen by primary medical oncologist Dr. Grayland Ormond on 01/14/2018 with a reviewed recent PET scan imaging revealing no findings suggesting recurrent or metastatic disease.  At that visit she reported increasing weakness and fatigue, significant shortness of breath with decreased oxygen saturations during ambulation.  She was given a prescription for home oxygen.   She was seen and evaluated by her primary care doctor Dr. Derrel Nip on 02/18/2017 for multiple chronic health issues.  She was evaluated for hypotension, hypoxia, postprandial hypoglycemia and anorexia.  Due to low blood pressures, Coreg dose was reduced which appeared to be helping.  Recently had cataract surgery  with lens placement by [facility] on 02/03/2018.  She was given prednisone taper eyedrops.  Noted improvement of hypoxia on supplemental oxygen, mainly requiring at nighttime.  Hypoglycemia has improved with having a snack before bedtime.  Scheduled to see endocrinology in August 2019.  Appetite improving due to having her daughter prepare meals for her.  Today, patient appears to be doing better. Her weight is stable. Eating better. Oxygen saturations are 100% on 2 L of oxygen. Bp is elevated 160/94. Possibly d/t change in BP medications. No blood sugar issues.   ADDITIONAL REVIEW OF SYSTEMS:  Review of Systems  Constitutional: Positive for malaise/fatigue. Negative for chills, fever and weight loss.  HENT: Negative for congestion and ear pain.   Eyes: Negative.  Negative for blurred vision and double vision.  Respiratory: Positive for shortness of breath. Negative for cough and sputum production.   Cardiovascular: Negative.  Negative for chest pain, palpitations and leg swelling.  Gastrointestinal: Negative.  Negative for abdominal pain, constipation, diarrhea, nausea and vomiting.  Genitourinary: Negative for dysuria, frequency and urgency.  Musculoskeletal: Negative for back pain and falls.  Skin: Negative.  Negative for rash.  Neurological: Positive for dizziness and weakness. Negative for headaches.  Endo/Heme/Allergies: Negative.  Does not bruise/bleed easily.  Psychiatric/Behavioral: Negative.  Negative for depression. The patient is not nervous/anxious and does not have insomnia.      PAST MEDICAL & SURGICAL HISTORY:  Past Medical History:  Diagnosis Date   Anemia 02/2013   F/U with Dr. Grayland Ormond for Feraheme injections   Anxiety    Arthritis    Possible RA - Follow up appt with Dr. Jefm Bryant   Cellulitis of arm, right  Diabetes insipidus ([place])    On desmopressin   GERD (gastroesophageal reflux disease)    H/O seasonal allergies    H/O transfusion of packed red blood cells  02/2013   3 units PRBC for severe anemia 02/2013   Herniated disc    History of kidney stones    Hyperlipidemia    Hypertension    Hyperthyroidism    s/p radiation therapy, now hypothyroidism   IBS (irritable bowel syndrome)    Migraine    Pacemaker    a. s/p successful implantation of a Medtronic CapSureFix Novus MRI SureScan serial # [id number] on 06/28/16 by Dr. Caryl Comes for sinus node dysfunction.   Pancreatic cyst    Paroxysmal A-fib [facility])    Renal cell cancer, right ([place]) 10/23/2015   Right partial nephrectomy.   Past Surgical History:  Procedure Laterality Date   ABDOMINAL HYSTERECTOMY     BREAST EXCISIONAL BIOPSY Right 2005   neg   BREAST SURGERY  1990s   Biopsy   CERVICAL SPINE SURGERY     Bone fusion   COLONOSCOPY  2008?   [place]   COLONOSCOPY WITH PROPOFOL N/A 10/15/2016   Procedure: COLONOSCOPY WITH PROPOFOL;  Surgeon: Robert Bellow, MD;  Location: [facility] ENDOSCOPY;  Service: Endoscopy;  Laterality: N/A;   EP IMPLANTABLE DEVICE N/A 06/28/2016   Procedure: Pacemaker Implant;  Surgeon: Deboraha Sprang, MD;  Location: [place] CV LAB;  Service: Cardiovascular;  Laterality: N/A;   HAMMER TOE SURGERY     HERNIA REPAIR Left    Inguinal Hernia Repair   PORTA CATH INSERTION N/A 07/24/2017   Procedure: PORTA CATH INSERTION;  Surgeon: Algernon Huxley, MD;  Location: [place] CV LAB;  Service: Cardiovascular;  Laterality: N/A;   ROBOTIC ASSITED PARTIAL NEPHRECTOMY Right 10/23/2015   Procedure: ROBOTIC ASSITED PARTIAL NEPHRECTOMY with intraop ultrasound;  Surgeon: Hollice Espy, MD;  Location: [facility] ORS;  Service: Urology;  Laterality: Right;   TUBAL LIGATION      SOCIAL HISTORY:   CURRENT MEDICATIONS:  Current Outpatient Medications on File Prior to Visit  Medication Sig Dispense Refill   acetaminophen (TYLENOL) 325 MG tablet Take 2 tablets (650 mg total) by mouth every 6 (six) hours as needed for pain. (Patient taking differently: Take  650 mg by mouth every 6 (six) hours as needed for pain. )     albuterol (PROVENTIL HFA;VENTOLIN HFA) 108 (90 Base) MCG/ACT inhaler Inhale 2 puffs into the lungs every 6 (six) hours as needed for wheezing or shortness of breath. 1 Inhaler 2   ALPRAZolam (XANAX) 0.25 MG tablet TAKE ONE TABLET BY MOUTH TWICE A DAY AS NEEDED FOR ANXIETY 60 tablet 5   brimonidine (ALPHAGAN) 0.2 % ophthalmic solution Place 1 drop into both eyes 2 (two) times daily.     Calcium Carbonate-Vitamin D (CALCIUM-CARB 600 + D) 600-125 MG-UNIT TABS Take 2 tablets by mouth daily.     carvedilol (COREG) 6.25 MG tablet Take 1 tablet (6.25 mg total) by mouth 2 (two) times daily. 180 tablet 2   cholecalciferol (VITAMIN D) 1000 units tablet Take 1,000 Units by mouth daily.     dorzolamide (TRUSOPT) 2 % ophthalmic solution Place 1 drop into both eyes 2 (two) times daily.      ferrous sulfate 325 (65 FE) MG tablet Take 325 mg by mouth daily with breakfast.     furosemide (LASIX) 20 MG tablet Take 0.5 tablets (10 mg total) by mouth daily. 45 tablet 4   gabapentin (NEURONTIN) 300 MG  capsule TAKE ONE CAPSULE BY MOUTH THREE TIMES daily for neuropathy (Patient taking differently: Take 300 mg by mouth 3 (three) times daily. ) 90 capsule 5   hydroxychloroquine (PLAQUENIL) 200 MG tablet Take 400 mg by mouth daily.      latanoprost (XALATAN) 0.005 % ophthalmic solution Place 1 drop into both eyes at bedtime.      levothyroxine (SYNTHROID, LEVOTHROID) 112 MCG tablet Take 1 tablet (112 mcg total) by mouth daily with breakfast. 90 tablet 1   lidocaine-prilocaine (EMLA) cream Apply to affected area once 30 g 3   mirabegron ER (MYRBETRIQ) 50 MG TB24 tablet Take 1 tablet (50 mg total) by mouth daily. 30 tablet 11   mirtazapine (REMERON) 15 MG tablet Take 1 tablet (15 mg total) by mouth at bedtime. 30 tablet 1   nitroGLYCERIN (NITROLINGUAL) 0.4 MG/SPRAY spray Place 1 spray under the tongue every 5 (five) minutes x 3 doses as needed for  chest pain. 4.9 g 0   Omega-3 Fatty Acids (FISH OIL) 1000 MG CAPS Take 1,000 mg by mouth daily.      ondansetron (ZOFRAN) 8 MG tablet Take 1 tablet (8 mg total) by mouth 2 (two) times daily as needed for refractory nausea / vomiting. (Patient taking differently: Take 8 mg by mouth 2 (two) times daily as needed for refractory nausea / vomiting. For refractory nausea/vomiting) 30 tablet 2   pantoprazole (PROTONIX) 40 MG tablet Take 40 mg by mouth daily.     potassium chloride SA (K-DUR,KLOR-CON) 20 MEQ tablet Take 1 tablet (20 mEq total) by mouth daily. 30 tablet 3   prochlorperazine (COMPAZINE) 10 MG tablet Take 1 tablet (10 mg total) by mouth every 6 (six) hours as needed (Nausea or vomiting). (Patient taking differently: Take 10 mg by mouth every 6 (six) hours as needed (Nausea or vomiting). For nausea/vomiting) 60 tablet 2   timolol (TIMOPTIC) 0.5 % ophthalmic solution Place 1 drop into both eyes 2 (two) times daily.      vitamin C (ASCORBIC ACID) 500 MG tablet Take 500 mg by mouth daily.     No current facility-administered medications on file prior to visit.     ALLERGIES: Allergies  Allergen Reactions   Metrizamide     Other reaction(s): Other (See Comments)   Adhesive [Tape] Other (See Comments)    "irritate skin"   Augmentin [Amoxicillin-Pot Clavulanate] Diarrhea    Severe diarrhea   Codeine     REACTION: NAUSEA   Morphine And Related Other (See Comments)    Shut down her organs   Other     Skin irritation   Tetanus Toxoid     REACTION: ARM SWELLING,REDNESS   Tramadol Nausea And Vomiting     PHYSICAL EXAM:  Vitals:   02/25/18 1354  BP: (!) 160/94  Pulse: 70  Resp: 18  Temp: (!) 96.6 F (35.9 C)  SpO2: 100%   Filed Weights   02/25/18 1354  Weight: 137 lb (62.1 kg)    Physical Exam  Constitutional: She is oriented to person, place, and time and well-developed, well-nourished, and in no distress. Vital signs are normal.  HENT:  Head: Normocephalic  and atraumatic.  Eyes: Pupils are equal, round, and reactive to light.  Neck: Normal range of motion.  Cardiovascular: Normal rate, regular rhythm and normal heart sounds.  No murmur heard. Pulmonary/Chest: Effort normal. She has decreased breath sounds. She has no wheezes.  2 Liters Oxygen Waiting for smaller portable oxygen  Abdominal: Soft. Normal appearance and bowel  sounds are normal. She exhibits no distension. There is no tenderness.  Musculoskeletal: Normal range of motion. She exhibits no edema.  Neurological: She is alert and oriented to person, place, and time. Gait normal.  Skin: Skin is warm and dry. No rash noted.  Psychiatric: Mood, memory, affect and judgment normal.    LABORATORY DATA:  Lab Results  Component Value Date   WBC 6.7 01/09/2018   HGB 10.0 (L) 01/09/2018   HCT 31.4 (L) 01/09/2018   MCV 96.7 01/09/2018   PLT 144.0 (L) 01/09/2018    DIAGNOSTIC IMAGING:  PET Scan 01/12/18  IMPRESSION: 1. No findings highly suspicious for local recurrence or metastatic disease. 2. The left lower lobe consolidation seen on the recent chest CT has partially cleared. There is new patchy airspace opacity in the lingula. There is low-level hypermetabolic activity in these areas, again most consistent with inflammation, possibly related to radiation or infection. Chest CT follow-up in 3-6 months recommended. 3. Stable postsurgical changes in the right kidney.  ASSESSMENT & PLAN:  Ms. Towle is a pleasant 77 y.o. 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  Patient presents to survivorship clinic today for survivorship care plan visit and to address any acute survivorship concerns since completing treatment.   1.  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  The SCP details her cancer treatment history and potential late/long-term side effects of those treatments.  We discussed the follow-up schedule she can anticipate with interval imaging for surveillance of her cancer.  I have also shared a copy of his treatment summary/SCP with his PCP.  #. Problem at visit: States all concerns mentioned above are improving with interventions put in place by PCP.   #. Smoking cessation: I commended Ms. Stella's continued efforts to remain tobacco-free.  We discussed that one of the most important risk reduction strategies in preventing cancer recurrence in lung cancer patients is smoking cessation.  She is committed to abstaining from tobacco.    #. Physical activity/Healthy eating: Getting adequate physical activity and maintaining a healthy diet as a cancer survivor is important for overall wellness and reduces the risk of cancer recurrence. We discussed the [facility], which is a fitness program that is offered to cancer survivors free of charge.  We also reviewed "The Nutrition Rainbow" handout, as well as the American Cancer Society's booklet with recommendations for nutrition and physical activity.    #. Health promotion/Cancer screening:  Ms. Yaden is reportedly up-to-date on her colonoscopy, pap smear, skin screenings, and vaccinations.  I encouraged her to talk with his PCP about arranging appropriate cancer screening tests, as appropriate.   #. Support services/Counseling: It is not uncommon for this period of the patient's cancer care trajectory to be one of many emotions and stressors.  Ms. Difatta  was encouraged to take advantage of our many support services programs, support groups, and/or counseling in coping with her new life as a cancer survivor after completing anti-cancer treatment. She was given a calendar of the cancer center's support services events, as well as  brochures for our spiritual care and free counseling resources.    Dispo:  -RTC in 6 months with repeat CT scan and further assessment with Dr. Grayland Ormond. CT  scan scheduled for 07/13/18 and Dr. Grayland Ormond assessment scheduled for 07/15/18. -Return to survivorship clinic as needed; no additional follow-up needed at this time.  -Consider transitioning the patient to long-term survivorship, when clinically appropriate.   A total of 25 minutes was spent in face-to-face care of this patient, with greater than 50% of that time spent in counseling and care coordination.   Faythe Casa, AGNP-C [place] [facility] [phone] 02/25/18 2:37 PM

## 2018-02-26 ENCOUNTER — Other Ambulatory Visit: Payer: Self-pay | Admitting: Internal Medicine

## 2018-02-26 ENCOUNTER — Telehealth: Payer: Self-pay | Admitting: Internal Medicine

## 2018-02-26 NOTE — Telephone Encounter (Signed)
Patient calling and states that she will not be home on 8/8 at 6:60Y due to a conflicting appointment for home remote check Would like to know if it can be changed to a later time that day Please call to discuss - does state she will not be home much today for calls

## 2018-02-26 NOTE — Telephone Encounter (Signed)
No answer x 2. No VM.  Mobile number is daughter.

## 2018-03-02 NOTE — Telephone Encounter (Signed)
Attempted to call (H) number x1 - N/A, mailbox is full.sss

## 2018-03-02 NOTE — Telephone Encounter (Signed)
Attempted to call patient. Unable to LM.sss

## 2018-03-03 NOTE — Telephone Encounter (Signed)
Attempted to call patient x 1 - unable to LM, memory is full.

## 2018-03-05 NOTE — Telephone Encounter (Signed)
Caroline Nichols frustrated that she has called back and has been unable to reach the [facility]. We have been unable to reach her over the past week.   I advised that she can send her remote transmission anytime on the 8th that is convenient for her. She verbalizes understanding.

## 2018-03-12 ENCOUNTER — Telehealth: Payer: Self-pay | Admitting: Internal Medicine

## 2018-03-12 ENCOUNTER — Ambulatory Visit (INDEPENDENT_AMBULATORY_CARE_PROVIDER_SITE_OTHER): Payer: Medicare Other | Admitting: *Deleted

## 2018-03-12 DIAGNOSIS — I495 Sick sinus syndrome: Secondary | ICD-10-CM

## 2018-03-12 DIAGNOSIS — R001 Bradycardia, unspecified: Secondary | ICD-10-CM

## 2018-03-12 DIAGNOSIS — H59032 Cystoid macular edema following cataract surgery, left eye: Secondary | ICD-10-CM | POA: Diagnosis not present

## 2018-03-12 NOTE — Telephone Encounter (Signed)
Copied from [place] [phone]. Topic: Quick Communication - See Telephone Encounter >> Mar 12, 2018  4:14 PM R[place]ford Nail, [place] wrote: CRM for notification. See Telephone encounter for: 03/12/18. Patient calling and states that she would like a call from Dr Derrel Nip regarding her blood pressure medication. States that it was changed the last time she was in the office and states that it is not acting right. At 3:00pm today patient states her blood pressure was 170/89. Please advise.  CB#: [phone]

## 2018-03-13 NOTE — Telephone Encounter (Signed)
I reduced her carvedilol dose from 12 mg to 6 mg because her readings were repeatedly low.  If her readings are now repeatedly over 160,  She can increase the carvedilol back to 12 mg daily, and I will send a new rx to her local pharmacy.  She can use 2 of the 6 mg tablets for each dose until they are gone

## 2018-03-13 NOTE — Progress Notes (Signed)
Remote pacemaker transmission.   

## 2018-03-13 NOTE — Telephone Encounter (Signed)
Want to talk about medication and bp

## 2018-03-13 NOTE — Telephone Encounter (Signed)
Attempted to call pt. No answer no voicemail.  

## 2018-03-16 NOTE — Telephone Encounter (Signed)
Pt advised of message below. She states that her blood pressure did improve later on Friday and the weekend.

## 2018-03-16 NOTE — Telephone Encounter (Signed)
Attempted to call pt. No answer, no voicemail. PEC may speak with pt.

## 2018-03-16 NOTE — Telephone Encounter (Signed)
Patient called office back to inform Dr. Derrel Nip that bp is still elevated

## 2018-03-16 NOTE — Telephone Encounter (Signed)
Called and sent mychart message to patient in reference to her BP. Her daughter states that she will look at my chart and go over it with her mother.

## 2018-03-26 ENCOUNTER — Other Ambulatory Visit: Payer: Self-pay | Admitting: Internal Medicine

## 2018-03-26 NOTE — Telephone Encounter (Signed)
Looks like this medication was discontinued on 12/19/2017 by different office.   Refilled: 06/30/2017 by Dr. Derrel Nip Last OV: 02/18/2018 Next OV: 08/21/2018

## 2018-03-26 NOTE — Telephone Encounter (Signed)
History of GI bleed ;  Should be on lifelong PPI,  protonix.refilled

## 2018-04-01 LAB — CUP PACEART REMOTE DEVICE CHECK
Battery Remaining Longevity: 106 mo
Battery Voltage: 3.02 V
Brady Statistic AP VP Percent: 0.1 %
Brady Statistic AP VS Percent: 20.09 %
Brady Statistic AS VP Percent: 0.05 %
Brady Statistic AS VS Percent: 79.77 %
Brady Statistic RA Percent Paced: 20.17 %
Brady Statistic RV Percent Paced: 0.14 %
Date Time Interrogation Session: 20190808213330
Implantable Lead Implant Date: 20171124
Implantable Lead Implant Date: 20171124
Implantable Lead Location: 753859
Implantable Lead Location: 753860
Implantable Lead Model: 5076
Implantable Lead Model: 5076
Implantable Pulse Generator Implant Date: 20171124
Lead Channel Impedance Value: 323 Ohm
Lead Channel Impedance Value: 380 Ohm
Lead Channel Impedance Value: 399 Ohm
Lead Channel Impedance Value: 494 Ohm
Lead Channel Pacing Threshold Amplitude: 0.75 V
Lead Channel Pacing Threshold Amplitude: 0.875 V
Lead Channel Pacing Threshold Pulse Width: 0.4 ms
Lead Channel Pacing Threshold Pulse Width: 0.4 ms
Lead Channel Sensing Intrinsic Amplitude: 1.375 mV
Lead Channel Sensing Intrinsic Amplitude: 1.375 mV
Lead Channel Sensing Intrinsic Amplitude: 11.5 mV
Lead Channel Sensing Intrinsic Amplitude: 11.5 mV
Lead Channel Setting Pacing Amplitude: 2 V
Lead Channel Setting Pacing Amplitude: 2.5 V
Lead Channel Setting Pacing Pulse Width: 0.4 ms
Lead Channel Setting Sensing Sensitivity: 0.9 mV

## 2018-04-07 ENCOUNTER — Ambulatory Visit (INDEPENDENT_AMBULATORY_CARE_PROVIDER_SITE_OTHER): Payer: Medicare Other | Admitting: Family Medicine

## 2018-04-07 ENCOUNTER — Encounter: Payer: Self-pay | Admitting: Family Medicine

## 2018-04-07 VITALS — BP 150/82 | HR 80 | Temp 99.4°F | Ht 62.0 in | Wt 137.6 lb

## 2018-04-07 DIAGNOSIS — J00 Acute nasopharyngitis [common cold]: Secondary | ICD-10-CM | POA: Diagnosis not present

## 2018-04-07 DIAGNOSIS — E538 Deficiency of other specified B group vitamins: Secondary | ICD-10-CM | POA: Diagnosis not present

## 2018-04-07 DIAGNOSIS — R070 Pain in throat: Secondary | ICD-10-CM | POA: Diagnosis not present

## 2018-04-07 DIAGNOSIS — R059 Cough, unspecified: Secondary | ICD-10-CM

## 2018-04-07 DIAGNOSIS — R05 Cough: Secondary | ICD-10-CM

## 2018-04-07 MED ORDER — BENZONATATE 100 MG PO CAPS: 100.0000 mg | ORAL_CAPSULE | Freq: Two times a day (BID) | ORAL | 0 refills | Status: DC | PRN

## 2018-04-07 MED ORDER — CYANOCOBALAMIN 1000 MCG/ML IJ SOLN
1000.0000 ug | Freq: Once | INTRAMUSCULAR | Status: AC
  Administered 2018-04-07: 1000 ug via INTRAMUSCULAR

## 2018-04-07 NOTE — Progress Notes (Signed)
Subjective:    Patient ID: Caroline Nichols, female    DOB: March 14, 1941, 77 y.o.   MRN: [id number]  HPI   Patient presents to clinic complaining of cough, congestion, sore throat for 3 days.  Denies any known sick contacts.  Denies fever or chills.  Currently patient has not used any over-the-counter allergy or cold medications to help symptoms.   Patient also reports she missed her August B12 injection, and believes this is contributing to her lower energy.  Patient Active Problem List   Diagnosis Date Noted  . GERD (gastroesophageal reflux disease) 02/20/2018  . Night sweats 01/11/2018  . Diarrhea in adult patient 01/11/2018  . Hypotension 01/11/2018  . Chronic respiratory failure ([place]) 01/01/2018  . PNA (pneumonia) 12/19/2017  . Statin intolerance 10/16/2017  . B12 deficiency 10/16/2017  . Concussion 07/20/2017  . Neuroendocrine carcinoma of lung ([place]) 07/16/2017  . Special screening for malignant neoplasms, colon 09/11/2016  . Encounter for Medicare annual wellness exam 07/21/2016  . Vitamin D deficiency 07/21/2016  . Pacemaker   . Sinus node dysfunction ([place]) 05/23/2016  . Fatigue 01/13/2016  . History of atrial fibrillation 10/28/2015  . Renal cell carcinoma of right kidney ([place]) 10/23/2015  . Goals of care, counseling/discussion 10/12/2015  . Urinary frequency 07/28/2015  . Inflammatory arthritis 05/10/2015  . Nocturia 04/16/2015  . Hyperlipidemia 12/17/2014  . Depression 12/14/2014  . Xerostomia 11/14/2014  . S/P hysterectomy 06/09/2014  . Atherosclerotic peripheral vascular disease with intermittent claudication ([place]) 10/07/2013  . Edema of both legs 10/07/2013  . Venous stasis dermatitis 10/07/2013  . Chest pain 03/26/2013  . Left carotid bruit 03/26/2013  . Acquired pancytopenia ([place]) 03/20/2013  . Bradycardia 12/09/2012  . Cervical vertebral fusion 12/09/2012  . Gastrointestinal hemorrhage 12/09/2012  . Neuropathy 12/09/2012  . History of tobacco abuse  12/09/2012  . IBS (irritable bowel syndrome)   . Degenerative arthritis of lumbar spine   . Mild mitral regurgitation 09/27/2009  . Postablative hypothyroidism 05/15/2009  . Anxiety state 05/15/2009  . Essential hypertension 05/15/2009   Social History   Tobacco Use  . Smoking status: Former Smoker    Packs/day: 0.50    Years: 30.00    Pack years: 15.00    Types: Cigarettes    Last attempt to quit: 12/03/2012    Years since quitting: 5.3  . Smokeless tobacco: Never Used  Substance Use Topics  . Alcohol use: No    Alcohol/week: 0.0 standard drinks   Review of Systems  Constitutional: Negative for chills, fever. Some fatigue (missed B12 shot) HENT: Some nasal congestion with clear drainage, sore throat. Eyes: Negative.   Respiratory: +dry cough. Negative shortness of breath and wheezing.   Cardiovascular: Negative for chest pain, palpitations and leg swelling.  Gastrointestinal: Negative for abdominal pain, diarrhea, nausea and vomiting.  Genitourinary: Negative for dysuria, frequency and urgency.  Musculoskeletal: Negative for arthralgias and myalgias.  Skin: Negative for color change, pallor and rash.  Neurological: Negative for syncope, light-headedness and headaches.  Psychiatric/Behavioral: The patient is not nervous/anxious.       Objective:   Physical Exam Physical Exam  Constitutional: She is oriented to person, place, and time. She appears well-developed and well-nourished. No distress.  HENT:  Head: Normocephalic and atraumatic.  Eyes: Pupils are equal, round, and reactive to light. EOM are normal. No scleral icterus.  Neck: Normal range of motion. Neck supple. No tracheal deviation present.  Cardiovascular: Normal rate, regular rhythm and normal heart sounds.  Pulmonary/Chest: Effort normal and  breath sounds normal. No respiratory distress. She has no wheezes. She has no rales.  Abdominal: Soft. Bowel sounds are normal. There is no tenderness.  Neurological:  She is alert and oriented to person, place, and time.  Gait normal  Skin: Skin is warm and dry. No pallor.  Psychiatric: She has a normal mood and affect. Her behavior is normal. Thought content normal.  Nursing note and vitals reviewed.   Vitals:   04/07/18 1459  BP: (!) 150/82  Pulse: 80  Temp: 99.4 F (37.4 C)  SpO2: 93%       Assessment & Plan:    Viral common cold- influenza and rapid strep in clinic are negative.  Patient advised she can use Tylenol as needed for pain, also can use over-the-counter Chloraseptic throat spray to soothe sore throat.  Salt water gargles can be helpful and soothing throat pain as well.  Advised to try over-the-counter Allegra or Claritin to dry up nasal congestion; offered Rx, but patient states she will get this over-the-counter.  Patient will use Tessalon Perles as needed to help calm cough.  Advised to increase fluids, rest, do good handwashing.  B12 deficiency - patient given B12 injection in clinic today.  Keep regular follow-up as already scheduled.  Return to clinic sooner if symptoms persist or worsen.

## 2018-04-07 NOTE — Patient Instructions (Signed)
Great to meet you!

## 2018-04-08 ENCOUNTER — Encounter: Payer: Self-pay | Admitting: Family Medicine

## 2018-04-08 LAB — POC INFLUENZA A&B (BINAX/QUICKVUE)
Influenza A, POC: NEGATIVE
Influenza B, POC: NEGATIVE

## 2018-04-08 LAB — POCT RAPID STREP A (OFFICE): RAPID STREP A SCREEN: NEGATIVE

## 2018-04-13 ENCOUNTER — Ambulatory Visit: Payer: Self-pay | Admitting: *Deleted

## 2018-04-13 NOTE — Telephone Encounter (Signed)
Called pt to assess symptoms; she states that she had a sore throat and cough; she was seen in office on 04/07/18 but symptoms have not gotten better; the pt has been using cough drops and tessalon, and salt water gargles but she has not tried anything OTC because she is on so many medications; recommendations made per protocol to include seeing a physician within 24 hours; the pt normally sees Dr Derrel Nip and saw Dr Philis Nettle on 04/07/18; the pt would like to see Dr Derrel Nip only but she has no availability per guidelines; spoke with [place], [place], and was informed that the pt can be seen by another provider, see [facility] at her next available appointment, or go to urgent care or ED; the pt would like for Dr Derrel Nip to call her something in; she can be contacted at [phone]; will route to office for notification of encounter and pt request.  Reason for Disposition . [1] Continuous (nonstop) coughing interferes with work or school AND [2] no improvement using cough treatment per Care Advice  Answer Assessment - Initial Assessment Questions 1. ONSET: "When did the cough begin?"      04/07/18 2. SEVERITY: "How bad is the cough today?"      severe 3. RESPIRATORY DISTRESS: "Describe your breathing."      breathing ok 4. FEVER: "Do you have a fever?" If so, ask: "What is your temperature, how was it measured, and when did it start?"    no 5. SPUTUM: "Describe the color of your sputum" (clear, white, yellow, green)     clear 6. HEMOPTYSIS: "Are you coughing up any blood?" If so ask: "How much?" (flecks, streaks, tablespoons, etc.)     no 7. CARDIAC HISTORY: "Do you have any history of heart disease?" (e.g., heart attack, congestive heart failure)      irregular heartbeat;  pacer in place 8. LUNG HISTORY: "Do you have any history of lung disease?"  (e.g., pulmonary embolus, asthma, emphysema)     Pneumonia May 2019, lung cancer, and COPD 9. PE RISK FACTORS: "Do you have a history of blood clots?"  (or: recent major surgery, recent prolonged travel, bedridden)   no 10. OTHER SYMPTOMS: "Do you have any other symptoms?" (e.g., runny nose, wheezing, chest pain)       Sore throat; ribs sore from coughing L>R 11. PREGNANCY: "Is there any chance you are pregnant?" "When was your last menstrual period?"       no 12. TRAVEL: "Have you traveled out of the country in the last month?" (e.g., travel history, exposures)       no  Protocols used: [place]

## 2018-04-13 NOTE — Telephone Encounter (Signed)
Spoke with pt and she has been scheduled for 04/14/2018 at 5pm for sore throat and productive cough.

## 2018-04-13 NOTE — Telephone Encounter (Signed)
Patient was triaged by [facility] nurse. She was seen by Philis Nettle on 04-07-18 but states she is not feeling better offered her an appointment to be see again patient would rather see Dr. Derrel Nip rather than another provider in the office. Can patient have something sent to the pharmacy or does patient need to be seen?

## 2018-04-14 ENCOUNTER — Ambulatory Visit (INDEPENDENT_AMBULATORY_CARE_PROVIDER_SITE_OTHER): Payer: Medicare Other | Admitting: Internal Medicine

## 2018-04-14 ENCOUNTER — Encounter: Payer: Self-pay | Admitting: Internal Medicine

## 2018-04-14 DIAGNOSIS — J0100 Acute maxillary sinusitis, unspecified: Secondary | ICD-10-CM | POA: Diagnosis not present

## 2018-04-14 MED ORDER — PREDNISONE 10 MG PO TABS: ORAL_TABLET | ORAL | 0 refills | Status: DC

## 2018-04-14 MED ORDER — BENZONATATE 200 MG PO CAPS: 200.0000 mg | ORAL_CAPSULE | Freq: Three times a day (TID) | ORAL | 1 refills | Status: DC | PRN

## 2018-04-14 MED ORDER — LEVOFLOXACIN 500 MG PO TABS: 500.0000 mg | ORAL_TABLET | Freq: Every day | ORAL | 0 refills | Status: DC

## 2018-04-14 NOTE — Progress Notes (Signed)
Subjective:  Patient ID: Caroline Nichols, female    DOB: [birth year]  Age: 77 y.o. MRN: [id number]  CC: The encounter diagnosis was Acute non-recurrent maxillary sinusitis.  HPI Caroline Nichols presents for persistent sore throat, productive cough .   She was seen on Sept 3rd,  Strep and flu rapid testing were negative.  Supportive care given .  Symptoms have persisted for the past week and she has developed  new onset rib pain. She has maxillary sinus pain and sinus congestion.     Outpatient Medications Prior to Visit  Medication Sig Dispense Refill  . acetaminophen (TYLENOL) 325 MG tablet Take 2 tablets (650 mg total) by mouth every 6 (six) hours as needed for pain. (Patient taking differently: Take 650 mg by mouth every 6 (six) hours as needed for pain. )    . albuterol (PROVENTIL HFA;VENTOLIN HFA) 108 (90 Base) MCG/ACT inhaler Inhale 2 puffs into the lungs every 6 (six) hours as needed for wheezing or shortness of breath. 1 Inhaler 2  . ALPRAZolam (XANAX) 0.25 MG tablet TAKE ONE TABLET BY MOUTH TWICE A DAY AS NEEDED FOR ANXIETY 60 tablet 5  . brimonidine (ALPHAGAN) 0.2 % ophthalmic solution Place 1 drop into both eyes 2 (two) times daily.    . Calcium Carbonate-Vitamin D (CALCIUM-CARB 600 + D) 600-125 MG-UNIT TABS Take 2 tablets by mouth daily.    . carvedilol (COREG) 6.25 MG tablet Take 1 tablet (6.25 mg total) by mouth 2 (two) times daily. 180 tablet 2  . cholecalciferol (VITAMIN D) 1000 units tablet Take 1,000 Units by mouth daily.    . dorzolamide (TRUSOPT) 2 % ophthalmic solution Place 1 drop into both eyes 2 (two) times daily.     . ferrous sulfate 325 (65 FE) MG tablet Take 325 mg by mouth daily with breakfast.    . furosemide (LASIX) 20 MG tablet Take 0.5 tablets (10 mg total) by mouth daily. 45 tablet 4  . gabapentin (NEURONTIN) 300 MG capsule TAKE ONE CAPSULE BY MOUTH THREE TIMES daily for neuropathy (Patient taking differently: Take 300 mg by mouth 3 (three) times daily. ) 90  capsule 5  . hydroxychloroquine (PLAQUENIL) 200 MG tablet Take 400 mg by mouth daily.     Marland Kitchen latanoprost (XALATAN) 0.005 % ophthalmic solution Place 1 drop into both eyes at bedtime.     Marland Kitchen levothyroxine (SYNTHROID, LEVOTHROID) 112 MCG tablet Take 1 tablet (112 mcg total) by mouth daily with breakfast. 90 tablet 1  . lidocaine-prilocaine (EMLA) cream Apply to affected area once 30 g 3  . losartan (COZAAR) 50 MG tablet TAKE ONE TABLET BY MOUTH DAILY 90 tablet 1  . mirabegron ER (MYRBETRIQ) 50 MG TB24 tablet Take 1 tablet (50 mg total) by mouth daily. 30 tablet 11  . mirtazapine (REMERON) 15 MG tablet Take 1 tablet (15 mg total) by mouth at bedtime. 30 tablet 1  . nitroGLYCERIN (NITROLINGUAL) 0.4 MG/SPRAY spray Place 1 spray under the tongue every 5 (five) minutes x 3 doses as needed for chest pain. 4.9 g 0  . Omega-3 Fatty Acids (FISH OIL) 1000 MG CAPS Take 1,000 mg by mouth daily.     . ondansetron (ZOFRAN) 8 MG tablet Take 1 tablet (8 mg total) by mouth 2 (two) times daily as needed for refractory nausea / vomiting. (Patient taking differently: Take 8 mg by mouth 2 (two) times daily as needed for refractory nausea / vomiting. For refractory nausea/vomiting) 30 tablet 2  . pantoprazole (PROTONIX) 40  MG tablet TAKE ONE TABLET BY MOUTH DAILY 30 tablet 5  . potassium chloride SA (K-DUR,KLOR-CON) 20 MEQ tablet Take 1 tablet (20 mEq total) by mouth daily. 30 tablet 3  . prochlorperazine (COMPAZINE) 10 MG tablet Take 1 tablet (10 mg total) by mouth every 6 (six) hours as needed (Nausea or vomiting). (Patient taking differently: Take 10 mg by mouth every 6 (six) hours as needed (Nausea or vomiting). For nausea/vomiting) 60 tablet 2  . timolol (TIMOPTIC) 0.5 % ophthalmic solution Place 1 drop into both eyes 2 (two) times daily.     . vitamin C (ASCORBIC ACID) 500 MG tablet Take 500 mg by mouth daily.    . benzonatate (TESSALON) 100 MG capsule Take 1 capsule (100 mg total) by mouth 2 (two) times daily as needed  for cough. 20 capsule 0  . pantoprazole (PROTONIX) 40 MG tablet Take 40 mg by mouth daily.     No facility-administered medications prior to visit.     Review of Systems;  Patient denies fevers,  unintentional weight loss, skin rash, eye pain,  dysphagia,  hemoptysis ,  dyspnea, wheezing, chest pain, palpitations, orthopnea, edema, abdominal pain, nausea, melena, diarrhea, constipation, flank pain, dysuria, hematuria, urinary  Frequency, nocturia, numbness, tingling, seizures,  Focal weakness, Loss of consciousness,  Tremor, insomnia, depression, anxiety, and suicidal ideation.      Objective:  BP (!) 170/86 (BP Location: Left Arm, Patient Position: Sitting, Cuff Size: Normal)   Pulse 81   Temp 98.7 F (37.1 C) (Oral)   Resp 15   Ht 5\' 2"  (1.575 m)   Wt 138 lb (62.6 kg)   SpO2 94%   BMI 25.24 kg/m   BP Readings from Last 3 Encounters:  04/14/18 (!) 170/86  04/07/18 (!) 150/82  02/25/18 (!) 160/94    Wt Readings from Last 3 Encounters:  04/14/18 138 lb (62.6 kg)  04/07/18 137 lb 9.6 oz (62.4 kg)  02/25/18 137 lb (62.1 kg)    General appearance: alert, cooperative and appears stated age Ears: normal TM's and external ear canals both ears Throat: lips, mucosa, and tongue normal; teeth and gums normal Neck: no adenopathy, no carotid bruit, supple, symmetrical, trachea midline and thyroid not enlarged, symmetric, no tenderness/mass/nodules Back: symmetric, no curvature. ROM normal. No CVA tenderness. Lungs: clear to auscultation bilaterally Heart: regular rate and rhythm, S1, S2 normal, no murmur, click, rub or gallop Abdomen: soft, non-tender; bowel sounds normal; no masses,  no organomegaly Pulses: 2+ and symmetric Skin: Skin color, texture, turgor normal. No rashes or lesions Lymph nodes: Cervical, supraclavicular, and axillary nodes normal.  Lab Results  Component Value Date   HGBA1C 5.2 01/09/2018   HGBA1C 5.3 06/09/2014   HGBA1C 5.2 12/27/2012    Lab Results    Component Value Date   CREATININE 0.76 01/12/2018   CREATININE 0.78 01/09/2018   CREATININE 0.71 12/31/2017    Lab Results  Component Value Date   WBC 6.7 01/09/2018   HGB 10.0 (L) 01/09/2018   HCT 31.4 (L) 01/09/2018   PLT 144.0 (L) 01/09/2018   GLUCOSE 91 01/12/2018   CHOL 166 11/26/2017   TRIG 78 11/26/2017   HDL 60 11/26/2017   LDLCALC 89 11/26/2017   ALT 12 (L) 01/12/2018   AST 21 01/12/2018   NA 142 01/12/2018   K 3.8 01/12/2018   CL 108 01/12/2018   CREATININE 0.76 01/12/2018   BUN 15 01/12/2018   CO2 25 01/12/2018   TSH 1.19 11/26/2017   INR 1.20  12/19/2017   HGBA1C 5.2 01/09/2018   MICROALBUR 0.7 06/09/2014    No results found.  Assessment & Plan:   Problem List Items Addressed This Visit    Sinusitis, acute, maxillary    Given her persistence of symptoms and pancytopenia,  Will treat empirically for sinusitis with antibiotics,  Steroids, and non opioid cough suppressant.       Relevant Medications   levofloxacin (LEVAQUIN) 500 MG tablet   predniSONE (DELTASONE) 10 MG tablet   benzonatate (TESSALON) 200 MG capsule      I have discontinued Silvano Rusk. Delvecchio's benzonatate. I am also having her start on levofloxacin, predniSONE, and benzonatate. Additionally, I am having her maintain her Fish Oil, acetaminophen, vitamin C, ferrous sulfate, brimonidine, hydroxychloroquine, latanoprost, dorzolamide, lidocaine-prilocaine, ondansetron, prochlorperazine, potassium chloride SA, timolol, nitroGLYCERIN, gabapentin, levothyroxine, cholecalciferol, Calcium Carbonate-Vitamin D, albuterol, mirabegron ER, mirtazapine, ALPRAZolam, carvedilol, furosemide, losartan, and pantoprazole.  Meds ordered this encounter  Medications  . levofloxacin (LEVAQUIN) 500 MG tablet    Sig: Take 1 tablet (500 mg total) by mouth daily.    Dispense:  7 tablet    Refill:  0  . predniSONE (DELTASONE) 10 MG tablet    Sig: 6 tablets on Day 1 , then reduce by 1 tablet daily until gone     Dispense:  21 tablet    Refill:  0  . benzonatate (TESSALON) 200 MG capsule    Sig: Take 1 capsule (200 mg total) by mouth 3 (three) times daily as needed for cough.    Dispense:  60 capsule    Refill:  1    Medications Discontinued During This Encounter  Medication Reason  . pantoprazole (PROTONIX) 40 MG tablet Duplicate  . benzonatate (TESSALON) 100 MG capsule     Follow-up: No follow-ups on file.   [name], MD

## 2018-04-14 NOTE — Patient Instructions (Signed)
I am treating you for sinusitis/otitis which is a complication from your viral infection due to  persistent sinus congestion.   I am prescribing an antibiotic (levaquin) and a prednisone taper  To manage the infection and the inflammation in your ear/sinuses.   I also advise use of the following OTC meds to help with your other symptoms.   Take generic OTC benadryl 25 mg every 8 hours for the drainage,  Sudafed PE  10 to 30 mg every 8 hours for the congestion, you may substitute Afrin nasal spray for the nighttime dose of sudafed PE  If needed to prevent insomnia.  Use benzonatate capsules   FOR THE daytime COUGH.  Gargle with salt water as needed for sore throat.   Please continue to take your  probiotic (  Culturelle) while you are on the antibiotic to prevent  the  serious antibiotic associated diarrhea  Called clostridium dificile colitis and a vaginal yeast infection

## 2018-04-15 ENCOUNTER — Inpatient Hospital Stay: Payer: Medicare Other

## 2018-04-15 ENCOUNTER — Inpatient Hospital Stay: Payer: Medicare Other | Attending: Oncology

## 2018-04-15 DIAGNOSIS — Z452 Encounter for adjustment and management of vascular access device: Secondary | ICD-10-CM | POA: Insufficient documentation

## 2018-04-15 DIAGNOSIS — C641 Malignant neoplasm of right kidney, except renal pelvis: Secondary | ICD-10-CM | POA: Insufficient documentation

## 2018-04-15 DIAGNOSIS — Z95828 Presence of other vascular implants and grafts: Secondary | ICD-10-CM

## 2018-04-15 DIAGNOSIS — J0100 Acute maxillary sinusitis, unspecified: Secondary | ICD-10-CM | POA: Insufficient documentation

## 2018-04-15 DIAGNOSIS — C7A090 Malignant carcinoid tumor of the bronchus and lung: Secondary | ICD-10-CM | POA: Diagnosis not present

## 2018-04-15 MED ORDER — SODIUM CHLORIDE 0.9% FLUSH
10.0000 mL | Freq: Once | INTRAVENOUS | Status: AC
  Administered 2018-04-15: 10 mL via INTRAVENOUS
  Filled 2018-04-15: qty 10

## 2018-04-15 MED ORDER — HEPARIN SOD (PORK) LOCK FLUSH 100 UNIT/ML IV SOLN
500.0000 [IU] | Freq: Once | INTRAVENOUS | Status: AC
  Administered 2018-04-15: 500 [IU] via INTRAVENOUS

## 2018-04-15 NOTE — Assessment & Plan Note (Signed)
Given her persistence of symptoms and pancytopenia,  Will treat empirically for sinusitis with antibiotics,  Steroids, and non opioid cough suppressant.

## 2018-04-15 NOTE — Telephone Encounter (Signed)
Pt was in the office yesterday and was advised to postpone the flu shot for another week because she is not feeling well.

## 2018-04-21 ENCOUNTER — Telehealth: Payer: Self-pay | Admitting: *Deleted

## 2018-04-21 NOTE — Telephone Encounter (Signed)
Pt called in to ask if could discontinue oxygen. Per Dr. Grayland Ormond, pt will need to get oxygen levels checked prior to discontinuing oxygen. Left message with patient that since will be going to PCP clinic soon to ask if they could check oxygen levels. If not, then patient would need to [facility] to [facility] to get [facility] checked. Instructed pt to call back with any further questions or concerns.

## 2018-04-23 ENCOUNTER — Ambulatory Visit: Payer: Medicare Other

## 2018-04-28 ENCOUNTER — Ambulatory Visit (INDEPENDENT_AMBULATORY_CARE_PROVIDER_SITE_OTHER): Payer: Medicare Other | Admitting: Internal Medicine

## 2018-04-28 ENCOUNTER — Encounter: Payer: Self-pay | Admitting: Internal Medicine

## 2018-04-28 VITALS — BP 140/82 | HR 67 | Ht 62.0 in | Wt 134.8 lb

## 2018-04-28 DIAGNOSIS — R001 Bradycardia, unspecified: Secondary | ICD-10-CM

## 2018-04-28 DIAGNOSIS — Z950 Presence of cardiac pacemaker: Secondary | ICD-10-CM | POA: Diagnosis not present

## 2018-04-28 DIAGNOSIS — I495 Sick sinus syndrome: Secondary | ICD-10-CM | POA: Diagnosis not present

## 2018-04-28 NOTE — Progress Notes (Signed)
Patient Care Team: [name], MD as PCP - General (Internal Medicine) [name], MD (Internal Medicine) [name], [name], MD (General Surgery) [name], RN as Registered Nurse Grayland Ormond, Kathlene November, MD as Consulting Physician (Oncology) Noreene Filbert, MD as Referring Physician (Radiation Oncology)   HPI  Caroline Nichols is a 77 y.o. female Seen in followup for pacer implanted for symptomatic bradycardia  She is better post pacing   Initially complicated by hematoma, but resolved and well healed  Has hx of renal cell cancer,   nodule found last fall turned out to be a neuroendocrine tumor in her  left lung.  Treated with XRT and chemotherapy.  Subsequent PET scans 3 and 6/19 have been negative.  Intercurrent hospitalization 5/19 with left lower lung consolidation DATE TEST    3/17    echo   EF 65 %  mild LAE  (2.3)   8/17    myoview   EF 69 % No ischemia   Date Cr  9//18 0.8       She is doing much better following her cancer treatments.  Next PET scan is scheduled for November. Mild shortness of breath but no edema orthopnea nocturnal dyspnea or chest pain.  Records and Results Reviewed   Past Medical History:  Diagnosis Date   Anemia 02/2013   F/U with Dr. Grayland Ormond for Feraheme injections   Anxiety    Arthritis    Possible RA - Follow up appt with Dr. Jefm Bryant   Cellulitis of arm, right    Diabetes insipidus ([place])    On desmopressin   GERD (gastroesophageal reflux disease)    H/O seasonal allergies    H/O transfusion of packed red blood cells 02/2013   3 units PRBC for severe anemia 02/2013   Herniated disc    History of kidney stones    Hyperlipidemia    Hypertension    Hyperthyroidism    s/p radiation therapy, now hypothyroidism   IBS (irritable bowel syndrome)    Migraine    Pacemaker    a. s/p successful implantation of a Medtronic CapSureFix Novus MRI SureScan serial # [id number] on 06/28/16 by Dr. Caryl Comes for  sinus node dysfunction.   Pancreatic cyst    Paroxysmal A-fib [facility])    Renal cell cancer, right ([place]) 10/23/2015   Right partial nephrectomy.    Past Surgical History:  Procedure Laterality Date   ABDOMINAL HYSTERECTOMY     BREAST EXCISIONAL BIOPSY Right 2005   neg   BREAST SURGERY  1990s   Biopsy   CERVICAL SPINE SURGERY     Bone fusion   COLONOSCOPY  2008?   [place]   COLONOSCOPY WITH PROPOFOL N/A 10/15/2016   Procedure: COLONOSCOPY WITH PROPOFOL;  Surgeon: Robert Bellow, MD;  Location: [facility] ENDOSCOPY;  Service: Endoscopy;  Laterality: N/A;   EP IMPLANTABLE DEVICE N/A 06/28/2016   Procedure: Pacemaker Implant;  Surgeon: Deboraha Sprang, MD;  Location: [place] CV LAB;  Service: Cardiovascular;  Laterality: N/A;   HAMMER TOE SURGERY     HERNIA REPAIR Left    Inguinal Hernia Repair   PORTA CATH INSERTION N/A 07/24/2017   Procedure: PORTA CATH INSERTION;  Surgeon: Algernon Huxley, MD;  Location: [facility] CV LAB;  Service: Cardiovascular;  Laterality: N/A;   ROBOTIC ASSITED PARTIAL NEPHRECTOMY Right 10/23/2015   Procedure: ROBOTIC ASSITED PARTIAL NEPHRECTOMY with intraop ultrasound;  Surgeon: Hollice Espy, MD;  Location: [facility] ORS;  Service: Urology;  Laterality: Right;   TUBAL LIGATION      Current Outpatient Medications  Medication Sig Dispense Refill   acetaminophen (TYLENOL) 325 MG tablet Take 2 tablets (650 mg total) by mouth every 6 (six) hours as needed for pain. (Patient taking differently: Take 650 mg by mouth every 6 (six) hours as needed for pain. )     albuterol (PROVENTIL HFA;VENTOLIN HFA) 108 (90 Base) MCG/ACT inhaler Inhale 2 puffs into the lungs every 6 (six) hours as needed for wheezing or shortness of breath. 1 Inhaler 2   ALPRAZolam (XANAX) 0.25 MG tablet TAKE ONE TABLET BY MOUTH TWICE A DAY AS NEEDED FOR ANXIETY 60 tablet 5   benzonatate (TESSALON) 200 MG capsule Take 1 capsule (200 mg total) by mouth 3 (three) times daily as  needed for cough. 60 capsule 1   brimonidine (ALPHAGAN) 0.2 % ophthalmic solution Place 1 drop into both eyes 2 (two) times daily.     Calcium Carbonate-Vitamin D (CALCIUM-CARB 600 + D) 600-125 MG-UNIT TABS Take 2 tablets by mouth daily.     carvedilol (COREG) 6.25 MG tablet Take 1 tablet (6.25 mg total) by mouth 2 (two) times daily. 180 tablet 2   cholecalciferol (VITAMIN D) 1000 units tablet Take 1,000 Units by mouth daily.     dorzolamide (TRUSOPT) 2 % ophthalmic solution Place 1 drop into both eyes 2 (two) times daily.      ferrous sulfate 325 (65 FE) MG tablet Take 325 mg by mouth daily with breakfast.     furosemide (LASIX) 20 MG tablet Take 0.5 tablets (10 mg total) by mouth daily. 45 tablet 4   gabapentin (NEURONTIN) 300 MG capsule TAKE ONE CAPSULE BY MOUTH THREE TIMES daily for neuropathy (Patient taking differently: Take 300 mg by mouth 3 (three) times daily. ) 90 capsule 5   hydroxychloroquine (PLAQUENIL) 200 MG tablet Take 400 mg by mouth daily.      latanoprost (XALATAN) 0.005 % ophthalmic solution Place 1 drop into both eyes at bedtime.      levothyroxine (SYNTHROID, LEVOTHROID) 112 MCG tablet Take 1 tablet (112 mcg total) by mouth daily with breakfast. 90 tablet 1   lidocaine-prilocaine (EMLA) cream Apply to affected area once 30 g 3   losartan (COZAAR) 50 MG tablet TAKE ONE TABLET BY MOUTH DAILY 90 tablet 1   mirabegron ER (MYRBETRIQ) 50 MG TB24 tablet Take 1 tablet (50 mg total) by mouth daily. 30 tablet 11   mirtazapine (REMERON) 15 MG tablet Take 1 tablet (15 mg total) by mouth at bedtime. 30 tablet 1   nitroGLYCERIN (NITROLINGUAL) 0.4 MG/SPRAY spray Place 1 spray under the tongue every 5 (five) minutes x 3 doses as needed for chest pain. 4.9 g 0   Omega-3 Fatty Acids (FISH OIL) 1000 MG CAPS Take 1,000 mg by mouth daily.      ondansetron (ZOFRAN) 8 MG tablet Take 1 tablet (8 mg total) by mouth 2 (two) times daily as needed for refractory nausea / vomiting.  (Patient taking differently: Take 8 mg by mouth 2 (two) times daily as needed for refractory nausea / vomiting. For refractory nausea/vomiting) 30 tablet 2   pantoprazole (PROTONIX) 40 MG tablet TAKE ONE TABLET BY MOUTH DAILY 30 tablet 5   potassium chloride SA (K-DUR,KLOR-CON) 20 MEQ tablet Take 1 tablet (20 mEq total) by mouth daily. 30 tablet 3   prochlorperazine (COMPAZINE) 10 MG tablet Take 1 tablet (10 mg total) by mouth every 6 (six) hours as needed (Nausea or vomiting). (Patient  taking differently: Take 10 mg by mouth every 6 (six) hours as needed (Nausea or vomiting). For nausea/vomiting) 60 tablet 2   timolol (TIMOPTIC) 0.5 % ophthalmic solution Place 1 drop into both eyes 2 (two) times daily.      vitamin C (ASCORBIC ACID) 500 MG tablet Take 500 mg by mouth daily.     No current facility-administered medications for this visit.     Allergies  Allergen Reactions   Metrizamide     Other reaction(s): Other (See Comments)   Adhesive [Tape] Other (See Comments)    "irritate skin"   Augmentin [Amoxicillin-Pot Clavulanate] Diarrhea    Severe diarrhea   Codeine     REACTION: NAUSEA   Morphine And Related Other (See Comments)    Shut down her organs   Other     Skin irritation   Tetanus Toxoid     REACTION: ARM SWELLING,REDNESS   Tramadol Nausea And Vomiting      Review of Systems negative except from HPI and PMH  Physical Exam BP 140/82 (BP Location: Left Arm, Patient Position: Sitting, Cuff Size: Normal)    Pulse 67    Ht 5\' 2"  (1.575 m)    Wt 134 lb 12 oz (61.1 kg)    BMI 24.65 kg/m  Well developed and nourished in no acute distress HENT normal Neck supple with JVP-flat Clear Device pocket well healed; without hematoma or erythema.  There is no tethering  Regular rate and rhythm, no murmurs or gallops Abd-soft with active BS No Clubbing cyanosis edema Skin-warm and dry A & Oriented  Grossly normal sensory and motor function  ECG sinus rhythm at 67 Axis  interval 17/09/46  Assessment and  Plan  Sinus node dysfunction  Pacemaker-Medtronic The patient's device was interrogated.  The information was reviewed. No changes were made in the programming.     Hypertension   Nonsustained atrial tachycardia/fibrillation episodes lasting up to hours but nothing more than 24 hours-SCAF  Sadness   We will continue to follow her SCAF.  The flurry of activity temporally was associated with sinus infection   no indication at this time yet for anticoagulation  Blood pressure is borderline elevated we will follow  Cancer is currently now in remission       Current medicines are reviewed at length with the patient today .  The patient does not have concerns regarding medicines.

## 2018-04-28 NOTE — Patient Instructions (Signed)
Medication Instructions: - Your physician recommends that you continue on your current medications as directed. Please refer to the Current Medication list given to you today.  Labwork: - none ordered  Procedures/Testing: - none ordered  Follow-Up: - Remote monitoring is used to monitor your Pacemaker of ICD from home. This monitoring reduces the number of office visits required to check your device to one time per year. It allows [place] to keep an eye on the functioning of your device to ensure it is working properly. You are scheduled for a device check from home on 06/11/18. You may send your transmission at any time that day. If you have a wireless device, the transmission will be sent automatically. After your physician reviews your transmission, you will receive a postcard with your next transmission date.  - Your physician wants you to follow-up in: 1 year with Dr. Caryl Comes. You will receive a reminder letter in the mail/ call two months in advance. If you don't receive a letter/ call, please call our office to schedule the follow-up appointment.   Any Additional Special Instructions Will Be Listed Below (If Applicable).     If you need a refill on your cardiac medications before your next appointment, please call your pharmacy.

## 2018-04-30 ENCOUNTER — Ambulatory Visit (INDEPENDENT_AMBULATORY_CARE_PROVIDER_SITE_OTHER): Payer: Medicare Other

## 2018-04-30 ENCOUNTER — Telehealth: Payer: Self-pay

## 2018-04-30 DIAGNOSIS — Z23 Encounter for immunization: Secondary | ICD-10-CM | POA: Diagnosis not present

## 2018-04-30 NOTE — Telephone Encounter (Signed)
Patient came in for flu shot and requested that I check her O2 it was 97% after ambulating  pulse 75 for Dr Grayland Ormond due he is in process of trying to discontinue  Home oxygen . He would like to be forwarded results.

## 2018-04-30 NOTE — Telephone Encounter (Signed)
Forwarding to Dr Grayland Ormond

## 2018-04-30 NOTE — Telephone Encounter (Signed)
I guess we were working on this?

## 2018-05-04 NOTE — Telephone Encounter (Signed)
Patient called and requested to discontinue her oxygen because she was feeling better. She had the upcoming appointment with pcp so it was suggested that she let them check her O2 sats there before she discontinued oxygen. I can call [facility] and let them know she can discontinue her O2 at this time.

## 2018-05-12 DIAGNOSIS — R748 Abnormal levels of other serum enzymes: Secondary | ICD-10-CM | POA: Diagnosis not present

## 2018-05-12 DIAGNOSIS — G629 Polyneuropathy, unspecified: Secondary | ICD-10-CM | POA: Diagnosis not present

## 2018-05-12 DIAGNOSIS — M0579 Rheumatoid arthritis with rheumatoid factor of multiple sites without organ or systems involvement: Secondary | ICD-10-CM | POA: Diagnosis not present

## 2018-05-13 ENCOUNTER — Ambulatory Visit (INDEPENDENT_AMBULATORY_CARE_PROVIDER_SITE_OTHER): Payer: Medicare Other | Admitting: *Deleted

## 2018-05-13 DIAGNOSIS — E538 Deficiency of other specified B group vitamins: Secondary | ICD-10-CM

## 2018-05-13 MED ORDER — CYANOCOBALAMIN 1000 MCG/ML IJ SOLN
1000.0000 ug | Freq: Once | INTRAMUSCULAR | Status: AC
  Administered 2018-05-13: 1000 ug via INTRAMUSCULAR

## 2018-05-13 NOTE — Progress Notes (Signed)
Patient presented for B 12 injection to left deltoid, patient voiced no concerns nor showed any signs of distress during injection. 

## 2018-05-26 ENCOUNTER — Other Ambulatory Visit: Payer: Self-pay | Admitting: Oncology

## 2018-05-26 ENCOUNTER — Other Ambulatory Visit: Payer: Self-pay | Admitting: Internal Medicine

## 2018-05-27 ENCOUNTER — Ambulatory Visit (INDEPENDENT_AMBULATORY_CARE_PROVIDER_SITE_OTHER): Payer: Medicare Other | Admitting: Vascular Surgery

## 2018-05-27 ENCOUNTER — Encounter (INDEPENDENT_AMBULATORY_CARE_PROVIDER_SITE_OTHER): Payer: Self-pay | Admitting: Vascular Surgery

## 2018-05-27 ENCOUNTER — Ambulatory Visit (INDEPENDENT_AMBULATORY_CARE_PROVIDER_SITE_OTHER): Payer: Medicare Other

## 2018-05-27 VITALS — BP 137/77 | HR 71 | Resp 16 | Ht 62.0 in | Wt 136.0 lb

## 2018-05-27 DIAGNOSIS — I70219 Atherosclerosis of native arteries of extremities with intermittent claudication, unspecified extremity: Secondary | ICD-10-CM

## 2018-05-27 DIAGNOSIS — E785 Hyperlipidemia, unspecified: Secondary | ICD-10-CM

## 2018-05-27 DIAGNOSIS — I10 Essential (primary) hypertension: Secondary | ICD-10-CM

## 2018-05-27 DIAGNOSIS — Z87891 Personal history of nicotine dependence: Secondary | ICD-10-CM | POA: Diagnosis not present

## 2018-05-27 NOTE — Progress Notes (Signed)
Subjective:    Patient ID: Caroline Nichols, female    DOB: [birth year], 77 y.o.   MRN: [id number] Chief Complaint  Patient presents with  . Follow-up    1 yr abi ultrasound   Patient presents for a yearly peripheral artery disease follow-up.  Patient presents without symptoms.  The patient denies any claudication-like symptoms, rest pain or ulcer formation to the bilateral lower extremity.  Patient denies any issues with edema.  The patient underwent a bilateral ABI which was notable for Right: Biphasic tibials with slightly dampened great toe waveforms, 0.96. Left: Biphasic tibial waveforms with normal great toe waveforms, 1.13.  Compared to an ABI conducted on May 21, 2017 (Right: 0.79 and Left: 0.89) there has been no improvement in the patient's ABI however she has not undergone any intervention.  This is most likely due to ultrasonographer interpretation. Patient denies any fever, nausea vomiting.   Review of Systems  Constitutional: Negative.   HENT: Negative.   Eyes: Negative.   Respiratory: Negative.   Cardiovascular:       PAD  Gastrointestinal: Negative.   Endocrine: Negative.   Genitourinary: Negative.   Musculoskeletal: Negative.   Skin: Negative.   Allergic/Immunologic: Negative.   Neurological: Negative.   Hematological: Negative.   Psychiatric/Behavioral: Negative.       Objective:   Physical Exam  Constitutional: She is oriented to person, place, and time. She appears well-developed and well-nourished. No distress.  HENT:  Head: Normocephalic and atraumatic.  Right Ear: External ear normal.  Left Ear: External ear normal.  Eyes: Pupils are equal, round, and reactive to light. Conjunctivae and EOM are normal.  Neck: Normal range of motion.  Cardiovascular: Normal rate, regular rhythm, normal heart sounds and intact distal pulses.  Pulses:      Radial pulses are 2+ on the right side, and 2+ on the left side.       Dorsalis pedis pulses are 2+ on the right  side, and 2+ on the left side.       Posterior tibial pulses are 2+ on the right side, and 2+ on the left side.  Pulmonary/Chest: Effort normal and breath sounds normal.  Musculoskeletal: Normal range of motion. She exhibits edema (Mild bilateral lower extremity edema ).  Neurological: She is alert and oriented to person, place, and time.  Skin: Skin is warm and dry. She is not diaphoretic.  Psychiatric: She has a normal mood and affect. Her behavior is normal. Judgment and thought content normal.  Vitals reviewed.  BP 137/77 (BP Location: Right Arm)   Pulse 71   Resp 16   Ht 5\' 2"  (1.575 m)   Wt 136 lb (61.7 kg)   BMI 24.87 kg/m   Past Medical History:  Diagnosis Date  . Anemia 02/2013   F/U with Dr. Orlie Dakin for Feraheme injections  . Anxiety   . Arthritis    Possible RA - Follow up appt with Dr. Gavin Potters  . Cellulitis of arm, right   . Diabetes insipidus (HCC)    On desmopressin  . GERD (gastroesophageal reflux disease)   . H/O seasonal allergies   . H/O transfusion of packed red blood cells 02/2013   3 units PRBC for severe anemia 02/2013  . Herniated disc   . History of kidney stones   . Hyperlipidemia   . Hypertension   . Hyperthyroidism    s/p radiation therapy, now hypothyroidism  . IBS (irritable bowel syndrome)   . Migraine   . Pacemaker  a. s/p successful implantation of a Medtronic CapSureFix Novus MRIT SureScan serial # [id number] on 06/28/16 by Dr. Graciela Husbands for sinus node dysfunction.  . Pancreatic cyst   . Paroxysmal A-fib (HCC)   . Renal cell cancer, right (HCC) 10/23/2015   Right partial nephrectomy.   Social History   Socioeconomic History  . Marital status: Widowed    Spouse name: Not on file  . Number of children: 4  . Years of education: 45  . Highest education level: Not on file  Occupational History  . Occupation: Designer, fashion/clothing    Comment: Retired  . Occupation: Camera operator and [employer]    Comment: Retired  Engineer, production  . Financial  resource strain: Not on file  . Food insecurity:    Worry: Not on file    Inability: Not on file  . Transportation needs:    Medical: Not on file    Non-medical: Not on file  Tobacco Use  . Smoking status: Former Smoker    Packs/day: 0.50    Years: 30.00    Pack years: 15.00    Types: Cigarettes    Last attempt to quit: 12/03/2012    Years since quitting: 5.4  . Smokeless tobacco: Never Used  Substance and Sexual Activity  . Alcohol use: No    Alcohol/week: 0.0 standard drinks  . Drug use: No  . Sexual activity: Never  Lifestyle  . Physical activity:    Days per week: Not on file    Minutes per session: Not on file  . Stress: Not on file  Relationships  . Social connections:    Talks on phone: Not on file    Gets together: Not on file    Attends religious service: Not on file    Active member of club or organization: Not on file    Attends meetings of clubs or organizations: Not on file    Relationship status: Not on file  . Intimate partner violence:    Fear of current or ex partner: Not on file    Emotionally abused: Not on file    Physically abused: Not on file    Forced sexual activity: Not on file  Other Topics Concern  . Not on file  Social History Narrative   Ms. Mccoin was born and reared in [place]. She is a widow since 10. She was married for 48 years. They had 4 children (daughters). She is currently leaving at [facility] since June. 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 She enjoys shopping. She also enjoys music, word puzzles and she loves spending time with her family. She loves attending church. One of her favorite things is wearing hats. She absolutely loves hats!   Past Surgical History:  Procedure Laterality Date  . ABDOMINAL HYSTERECTOMY    . BREAST EXCISIONAL BIOPSY Right 2005   neg  . BREAST SURGERY  1990s   Biopsy  . CERVICAL SPINE SURGERY     Bone  fusion  . COLONOSCOPY  2008?   [place]  . COLONOSCOPY WITH PROPOFOL N/A 10/15/2016   Procedure: COLONOSCOPY WITH PROPOFOL;  Surgeon: Earline Mayotte, MD;  Location: [facility] ENDOSCOPY;  Service: Endoscopy;  Laterality: N/A;  . EP IMPLANTABLE DEVICE N/A 06/28/2016   Procedure: Pacemaker Implant;  Surgeon: Duke Salvia, MD;  Location[facility] INVASIVE CV LAB;  Service: Cardiovascular;  Laterality: N/A;  . HAMMER TOE SURGERY    .  HERNIA REPAIR Left    Inguinal Hernia Repair  . PORTA CATH INSERTION N/A 07/24/2017   Procedure: PORTA CATH INSERTION;  Surgeon: Annice Needy, MD;  Location: [facility] INVASIVE CV LAB;  Service: Cardiovascular;  Laterality: N/A;  . ROBOTIC ASSITED PARTIAL NEPHRECTOMY Right 10/23/2015   Procedure: ROBOTIC ASSITED PARTIAL NEPHRECTOMY with intraop ultrasound;  Surgeon: Vanna Scotland, MD;  Location: [facility] ORS;  Service: Urology;  Laterality: Right;  . TUBAL LIGATION     Family History  Problem Relation Age of Onset  . Heart disease Mother   . Heart disease Father   . Diabetes Brother   . Kidney disease Brother   . Stroke Daughter        due to medication reaction  . Breast cancer Cousin   . Prostate cancer Neg Hx   . Hematuria Neg Hx    Allergies  Allergen Reactions  . Metrizamide     Other reaction(s): Other (See Comments)  . Adhesive [Tape] Other (See Comments)    "irritate skin"  . Augmentin [Amoxicillin-Pot Clavulanate] Diarrhea    Severe diarrhea  . Codeine     REACTION: NAUSEA  . Morphine And Related Other (See Comments)    Shut down her organs  . Other     Skin irritation  . Tetanus Toxoid     REACTION: ARM SWELLING,REDNESS  . Tramadol Nausea And Vomiting      Assessment & Plan:  Patient presents for a yearly peripheral artery disease follow-up.  Patient presents without symptoms.  The patient denies any claudication-like symptoms, rest pain or ulcer formation to the bilateral lower extremity.  Patient denies any issues with edema.  The patient  underwent a bilateral ABI which was notable for Right: Biphasic tibials with slightly dampened great toe waveforms, 0.96. Left: Biphasic tibial waveforms with normal great toe waveforms, 1.13.  Compared to an ABI conducted on May 21, 2017 (Right: 0.79 and Left: 0.89) there has been no improvement in the patient's ABI however she has not undergone any intervention.  This is most likely due to ultrasonographer interpretation. Patient denies any fever, nausea vomiting.   1. Atherosclerotic peripheral vascular disease with intermittent claudication (HCC) - Stable Patient presents today without complaint Physical exam is stable ABI with very mild peripheral artery disease There is no indication for intervention at this time The patient should follow-up in one year for a surveillance ABI I have discussed with the patient at length the risk factors for and pathogenesis of atherosclerotic disease and encouraged a healthy diet, regular exercise regimen and blood pressure / glucose control.  The patient was encouraged to call the office in the interim if he experiences any claudication like symptoms, rest pain or ulcers to his feet / toes.  - VAS [place] ABI WITH/WO TBI; Future  2. Hyperlipidemia, unspecified hyperlipidemia type - Stable Encouraged good control as its slows the progression of atherosclerotic disease  3. Essential hypertension - Stable Encouraged good control as its slows the progression of atherosclerotic disease  Current Outpatient Medications on File Prior to Visit  Medication Sig Dispense Refill  . acetaminophen (TYLENOL) 325 MG tablet Take 2 tablets (650 mg total) by mouth every 6 (six) hours as needed for pain. (Patient taking differently: Take 650 mg by mouth every 6 (six) hours as needed for pain. )    . albuterol (PROVENTIL HFA;VENTOLIN HFA) 108 (90 Base) MCG/ACT inhaler Inhale 2 puffs into the lungs every 6 (six) hours as needed for wheezing or shortness of breath. 1  Inhaler 2    . ALPRAZolam (XANAX) 0.25 MG tablet TAKE ONE TABLET BY MOUTH TWICE A DAY AS NEEDED FOR ANXIETY 60 tablet 5  . benzonatate (TESSALON) 200 MG capsule Take 1 capsule (200 mg total) by mouth 3 (three) times daily as needed for cough. 60 capsule 1  . brimonidine (ALPHAGAN) 0.2 % ophthalmic solution Place 1 drop into both eyes 2 (two) times daily.    . Calcium Carbonate-Vitamin D (CALCIUM-CARB 600 + D) 600-125 MG-UNIT TABS Take 2 tablets by mouth daily.    . carvedilol (COREG) 6.25 MG tablet Take 1 tablet (6.25 mg total) by mouth 2 (two) times daily. 180 tablet 2  . cholecalciferol (VITAMIN D) 1000 units tablet Take 1,000 Units by mouth daily.    . dorzolamide (TRUSOPT) 2 % ophthalmic solution Place 1 drop into both eyes 2 (two) times daily.     . ferrous sulfate 325 (65 FE) MG tablet Take 325 mg by mouth daily with breakfast.    . furosemide (LASIX) 20 MG tablet Take 0.5 tablets (10 mg total) by mouth daily. 45 tablet 4  . gabapentin (NEURONTIN) 300 MG capsule TAKE ONE CAPSULE BY MOUTH THREE TIMES daily for neuropathy (Patient taking differently: Take 300 mg by mouth 3 (three) times daily. ) 90 capsule 5  . hydroxychloroquine (PLAQUENIL) 200 MG tablet Take 400 mg by mouth daily.     Marland Kitchen latanoprost (XALATAN) 0.005 % ophthalmic solution Place 1 drop into both eyes at bedtime.     Marland Kitchen levothyroxine (SYNTHROID, LEVOTHROID) 112 MCG tablet Take 1 tablet (112 mcg total) by mouth daily with breakfast. 90 tablet 1  . losartan (COZAAR) 50 MG tablet TAKE ONE TABLET BY MOUTH DAILY 90 tablet 1  . mirabegron ER (MYRBETRIQ) 50 MG TB24 tablet Take 1 tablet (50 mg total) by mouth daily. 30 tablet 11  . mirtazapine (REMERON) 15 MG tablet TAKE ONE TABLET BY MOUTH EVERY NIGHT AT BEDTIME 30 tablet 0  . nitroGLYCERIN (NITROLINGUAL) 0.4 MG/SPRAY spray Place 1 spray under the tongue every 5 (five) minutes x 3 doses as needed for chest pain. 4.9 g 0  . Omega-3 Fatty Acids (FISH OIL) 1000 MG CAPS Take 1,000 mg by mouth daily.      . ondansetron (ZOFRAN) 8 MG tablet Take 1 tablet (8 mg total) by mouth 2 (two) times daily as needed for refractory nausea / vomiting. (Patient taking differently: Take 8 mg by mouth 2 (two) times daily as needed for refractory nausea / vomiting. For refractory nausea/vomiting) 30 tablet 2  . pantoprazole (PROTONIX) 40 MG tablet TAKE ONE TABLET BY MOUTH DAILY 30 tablet 5  . potassium chloride SA (K-DUR,KLOR-CON) 20 MEQ tablet Take 1 tablet (20 mEq total) by mouth daily. 30 tablet 3  . prochlorperazine (COMPAZINE) 10 MG tablet Take 1 tablet (10 mg total) by mouth every 6 (six) hours as needed (Nausea or vomiting). (Patient taking differently: Take 10 mg by mouth every 6 (six) hours as needed (Nausea or vomiting). For nausea/vomiting) 60 tablet 2  . timolol (TIMOPTIC) 0.5 % ophthalmic solution Place 1 drop into both eyes 2 (two) times daily.     . vitamin C (ASCORBIC ACID) 500 MG tablet Take 500 mg by mouth daily.    Marland Kitchen lidocaine-prilocaine (EMLA) cream Apply to affected area once (Patient not taking: Reported on 05/27/2018) 30 g 3   No current facility-administered medications on file prior to visit.    There are no Patient Instructions on file for this visit. No follow-ups  on file.  Chowan Shellhammer A Chazz Philson, PA-C

## 2018-06-02 ENCOUNTER — Other Ambulatory Visit: Payer: Self-pay | Admitting: Internal Medicine

## 2018-06-02 DIAGNOSIS — Z1231 Encounter for screening mammogram for malignant neoplasm of breast: Secondary | ICD-10-CM

## 2018-06-03 ENCOUNTER — Inpatient Hospital Stay: Payer: Medicare Other | Attending: Oncology

## 2018-06-03 DIAGNOSIS — C7A090 Malignant carcinoid tumor of the bronchus and lung: Secondary | ICD-10-CM | POA: Diagnosis not present

## 2018-06-03 DIAGNOSIS — C641 Malignant neoplasm of right kidney, except renal pelvis: Secondary | ICD-10-CM | POA: Diagnosis not present

## 2018-06-03 DIAGNOSIS — Z923 Personal history of irradiation: Secondary | ICD-10-CM | POA: Diagnosis not present

## 2018-06-03 DIAGNOSIS — Z452 Encounter for adjustment and management of vascular access device: Secondary | ICD-10-CM | POA: Insufficient documentation

## 2018-06-03 DIAGNOSIS — Z95828 Presence of other vascular implants and grafts: Secondary | ICD-10-CM

## 2018-06-03 MED ORDER — HEPARIN SOD (PORK) LOCK FLUSH 100 UNIT/ML IV SOLN
500.0000 [IU] | Freq: Once | INTRAVENOUS | Status: AC
  Administered 2018-06-03: 500 [IU] via INTRAVENOUS

## 2018-06-03 MED ORDER — SODIUM CHLORIDE 0.9% FLUSH
10.0000 mL | Freq: Once | INTRAVENOUS | Status: AC
  Administered 2018-06-03: 10 mL via INTRAVENOUS
  Filled 2018-06-03: qty 10

## 2018-06-11 ENCOUNTER — Telehealth: Payer: Self-pay

## 2018-06-11 ENCOUNTER — Ambulatory Visit (INDEPENDENT_AMBULATORY_CARE_PROVIDER_SITE_OTHER): Payer: Medicare Other | Admitting: *Deleted

## 2018-06-11 DIAGNOSIS — I495 Sick sinus syndrome: Secondary | ICD-10-CM | POA: Diagnosis not present

## 2018-06-11 NOTE — Telephone Encounter (Signed)
I spoke with the patient about this.

## 2018-06-12 ENCOUNTER — Other Ambulatory Visit: Payer: Self-pay | Admitting: Internal Medicine

## 2018-06-14 ENCOUNTER — Encounter: Payer: Self-pay | Admitting: Cardiology

## 2018-06-14 NOTE — Progress Notes (Signed)
Remote pacemaker transmission.   

## 2018-06-16 ENCOUNTER — Other Ambulatory Visit: Payer: Self-pay

## 2018-06-16 ENCOUNTER — Ambulatory Visit (INDEPENDENT_AMBULATORY_CARE_PROVIDER_SITE_OTHER): Payer: Medicare Other | Admitting: Urology

## 2018-06-16 ENCOUNTER — Encounter: Payer: Self-pay | Admitting: Urology

## 2018-06-16 VITALS — BP 180/97 | HR 73 | Wt 135.0 lb

## 2018-06-16 DIAGNOSIS — I70219 Atherosclerosis of native arteries of extremities with intermittent claudication, unspecified extremity: Secondary | ICD-10-CM | POA: Diagnosis not present

## 2018-06-16 DIAGNOSIS — Z85528 Personal history of other malignant neoplasm of kidney: Secondary | ICD-10-CM

## 2018-06-16 DIAGNOSIS — N3281 Overactive bladder: Secondary | ICD-10-CM

## 2018-06-16 NOTE — Progress Notes (Signed)
June 16, 2018  3:52 PM   Caroline Nichols 1940/08/18 [id number]  Referring provider: Sherlene Shams, MD [address] [place], [place] [address]  Chief Complaint  Patient presents with  . Urinary Frequency    follow up    HPI: Caroline Nichols is a 77 year old female s/p robotic partial nephrectomy on 10/23/15 who returns today for management and evaluation of his history of Renal cell carcinoma and urinary incontinence. Surgical pathology consistent with clear cell papillary renal cell carcinoma, negative margins, 2cm, Fuhrman grade 2, negative margins. [id number].   The patient's last visit with [place] was on 06/13/17, at which time a 2.6 cm pulmonary nodule was observed incidentally and I referred the patient to Dr. Orlie Dakin. She pursued concurrent chemotherapy and radiation.    Most recent cross sectional imaging was PET/CT from 01/12/18 without recurrence of kidney tumor. Per Dr. Milinda Cave note, pt is due for 6 month interval imaging in December with Chest CT.  The pt reports that she has continued Myrbetriq and notes that this has been helpful for her night time symptoms.   Overall, she has no complaints today.    PMH: Past Medical History:  Diagnosis Date  . Anemia 02/2013   F/U with Dr. Orlie Dakin for Feraheme injections  . Anxiety   . Arthritis    Possible RA - Follow up appt with Dr. Gavin Potters  . Cellulitis of arm, right   . Diabetes insipidus (HCC)    On desmopressin  . GERD (gastroesophageal reflux disease)   . H/O seasonal allergies   . H/O transfusion of packed red blood cells 02/2013   3 units PRBC for severe anemia 02/2013  . Herniated disc   . History of kidney stones   . Hyperlipidemia   . Hypertension   . Hyperthyroidism    s/p radiation therapy, now hypothyroidism  . IBS (irritable bowel syndrome)   . Migraine   . Pacemaker    a. s/p successful implantation of a Medtronic CapSureFix Novus MRIT SureScan serial # [id number] on 06/28/16 by Dr. Graciela Husbands  for sinus node dysfunction.  . Pancreatic cyst   . Paroxysmal A-fib (HCC)   . Renal cell cancer, right (HCC) 10/23/2015   Right partial nephrectomy.    Surgical History: Past Surgical History:  Procedure Laterality Date  . ABDOMINAL HYSTERECTOMY    . BREAST EXCISIONAL BIOPSY Right 2005   neg  . BREAST SURGERY  1990s   Biopsy  . CERVICAL SPINE SURGERY     Bone fusion  . [place]  2008?   [place]  . COLONOSCOPY WITH PROPOFOL N/A 10/15/2016   Procedure: COLONOSCOPY WITH PROPOFOL;  Surgeon: Earline Mayotte, MD;  Location[facility] ENDOSCOPY;  Service: Endoscopy;  Laterality: N/A;  . EP IMPLANTABLE DEVICE N/A 06/28/2016   Procedure: Pacemaker Implant;  Surgeon: Duke Salvia, MD;  Location: [facility] INVASIVE CV LAB;  Service: Cardiovascular;  Laterality: N/A;  . HAMMER TOE SURGERY    . HERNIA REPAIR Left    Inguinal Hernia Repair  . PORTA CATH INSERTION N/A 07/24/2017   Procedure: PORTA CATH INSERTION;  Surgeon: Annice Needy, MD;  Location: [facility] INVASIVE CV LAB;  Service: Cardiovascular;  Laterality: N/A;  . ROBOTIC ASSITED PARTIAL NEPHRECTOMY Right 10/23/2015   Procedure: ROBOTIC ASSITED PARTIAL NEPHRECTOMY with intraop ultrasound;  Surgeon: Vanna Scotland, MD;  Location: [facility] ORS;  Service: Urology;  Laterality: Right;  . TUBAL LIGATION      Home Medications:  Allergies as of 06/16/2018  Reactions   Metrizamide    Other reaction(s): Other (See Comments)   Adhesive [tape] Other (See Comments)   "irritate skin"   Augmentin [amoxicillin-pot Clavulanate] Diarrhea   Severe diarrhea   Codeine    REACTION: NAUSEA   Morphine And Related Other (See Comments)   Shut down her organs   Other    Skin irritation   Tetanus Toxoid    REACTION: ARM SWELLING,REDNESS   Tramadol Nausea And Vomiting      Medication List        Accurate as of 06/16/18  3:52 PM. Always use your most recent med list.          acetaminophen 325 MG tablet Commonly known as:  TYLENOL Take 2  tablets (650 mg total) by mouth every 6 (six) hours as needed for pain.   albuterol 108 (90 Base) MCG/ACT inhaler Commonly known as:  PROVENTIL HFA;VENTOLIN HFA Inhale 2 puffs into the lungs every 6 (six) hours as needed for wheezing or shortness of breath.   ALPRAZolam 0.25 MG tablet Commonly known as:  XANAX TAKE ONE TABLET BY MOUTH TWICE A DAY AS NEEDED FOR ANXIETY   brimonidine 0.2 % ophthalmic solution Commonly known as:  ALPHAGAN Place 1 drop into both eyes 2 (two) times daily.   CALCIUM-CARB 600 + D 600-125 MG-UNIT Tabs Generic drug:  Calcium Carbonate-Vitamin D Take 2 tablets by mouth daily.   carvedilol 6.25 MG tablet Commonly known as:  COREG Take 1 tablet (6.25 mg total) by mouth 2 (two) times daily.   cholecalciferol 1000 units tablet Commonly known as:  VITAMIN D Take 1,000 Units by mouth daily.   dorzolamide 2 % ophthalmic solution Commonly known as:  TRUSOPT Place 1 drop into both eyes 2 (two) times daily.   ferrous sulfate 325 (65 FE) MG tablet Take 325 mg by mouth daily with breakfast.   Fish Oil 1000 MG Caps Take 1,000 mg by mouth daily.   furosemide 20 MG tablet Commonly known as:  LASIX Take 0.5 tablets (10 mg total) by mouth daily.   gabapentin 300 MG capsule Commonly known as:  NEURONTIN TAKE ONE CAPSULE BY MOUTH THREE TIMES A DAY AS NEEDED FOR SHINGLES PAIN   hydroxychloroquine 200 MG tablet Commonly known as:  PLAQUENIL Take 400 mg by mouth daily.   latanoprost 0.005 % ophthalmic solution Commonly known as:  XALATAN Place 1 drop into both eyes at bedtime.   levothyroxine 112 MCG tablet Commonly known as:  SYNTHROID, LEVOTHROID TAKE ONE TABLET BY MOUTH EVERY MORNING WITH FOOD   losartan 50 MG tablet Commonly known as:  COZAAR TAKE ONE TABLET BY MOUTH DAILY   mirabegron ER 50 MG Tb24 tablet Commonly known as:  MYRBETRIQ Take 1 tablet (50 mg total) by mouth daily.   mirtazapine 15 MG tablet Commonly known as:  REMERON TAKE ONE  TABLET BY MOUTH EVERY NIGHT AT BEDTIME   nitroGLYCERIN 0.4 MG/SPRAY spray Commonly known as:  NITROLINGUAL Place 1 spray under the tongue every 5 (five) minutes x 3 doses as needed for chest pain.   pantoprazole 40 MG tablet Commonly known as:  PROTONIX TAKE ONE TABLET BY MOUTH DAILY   potassium chloride SA 20 MEQ tablet Commonly known as:  K-DUR,KLOR-CON Take 1 tablet (20 mEq total) by mouth daily.   timolol 0.5 % ophthalmic solution Commonly known as:  TIMOPTIC Place 1 drop into both eyes 2 (two) times daily.   vitamin C 500 MG tablet Commonly known as:  ASCORBIC ACID  Take 500 mg by mouth daily.       Allergies:  Allergies  Allergen Reactions  . Metrizamide     Other reaction(s): Other (See Comments)  . Adhesive [Tape] Other (See Comments)    "irritate skin"  . Augmentin [Amoxicillin-Pot Clavulanate] Diarrhea    Severe diarrhea  . Codeine     REACTION: NAUSEA  . Morphine And Related Other (See Comments)    Shut down her organs  . Other     Skin irritation  . Tetanus Toxoid     REACTION: ARM SWELLING,REDNESS  . Tramadol Nausea And Vomiting    Family History: Family History  Problem Relation Age of Onset  . Heart disease Mother   . Heart disease Father   . Diabetes Brother   . Kidney disease Brother   . Stroke Daughter        due to medication reaction  . Breast cancer Cousin   . Prostate cancer Neg Hx   . Hematuria Neg Hx     Social History:  reports that she quit smoking about 5 years ago. Her smoking use included cigarettes. She has a 15.00 pack-year smoking history. She has never used smokeless tobacco. She reports that she does not drink alcohol or use drugs.  ROS: UROLOGY Frequent Urination?: Yes Hard to postpone urination?: No Burning/pain with urination?: No Get up at night to urinate?: Yes Leakage of urine?: No Urine stream starts and stops?: Yes Trouble starting stream?: No Do you have to strain to urinate?: No Blood in urine?:  No Urinary tract infection?: No Sexually transmitted disease?: No Injury to kidneys or bladder?: No Painful intercourse?: No Weak stream?: No Currently pregnant?: No Vaginal bleeding?: No Last menstrual period?: n  Gastrointestinal Nausea?: No Vomiting?: No Indigestion/heartburn?: Yes Diarrhea?: Yes Constipation?: No  Constitutional Fever: No Night sweats?: Yes Weight loss?: No Fatigue?: Yes  Skin Skin rash/lesions?: No Itching?: Yes  Eyes Blurred vision?: Yes Double vision?: No  Ears/Nose/Throat Sore throat?: No Sinus problems?: Yes  Hematologic/Lymphatic Swollen glands?: No Easy bruising?: Yes  Cardiovascular Leg swelling?: No Chest pain?: No  Respiratory Cough?: Yes Shortness of breath?: No  Endocrine Excessive thirst?: No  Musculoskeletal Back pain?: Yes Joint pain?: Yes  Neurological Headaches?: No Dizziness?: Yes  Psychologic Depression?: No Anxiety?: Yes  Physical Exam: BP (!) 180/97   Pulse 73   Wt 135 lb (61.2 kg)   BMI 24.69 kg/m   Constitutional:  Alert and oriented, No acute distress. HEENT: Cucumber AT, moist mucus membranes.  Trachea midline, no masses. Cardiovascular: No clubbing, cyanosis, or edema. Respiratory: Normal respiratory effort, no increased work of breathing. GU: No CVA tenderness Skin: No rashes, bruises or suspicious lesions. Neurologic: Grossly intact, no focal deficits, moving all 4 extremities. Psychiatric: Normal mood and affect.  Laboratory Data: Lab Results  Component Value Date   WBC 6.7 01/09/2018   HGB 10.0 (L) 01/09/2018   HCT 31.4 (L) 01/09/2018   MCV 96.7 01/09/2018   PLT 144.0 (L) 01/09/2018    Lab Results  Component Value Date   CREATININE 0.76 01/12/2018    Lab Results  Component Value Date   HGBA1C 5.2 01/09/2018   Pertinent Imaging:  CLINICAL DATA:  Subsequent treatment strategy for left lower lobe large cell neuroendocrine tumor. History of partial right nephrectomy for renal  cell carcinoma.  EXAM: NUCLEAR MEDICINE PET SKULL BASE TO THIGH  TECHNIQUE: 6.95 mCi F-18 FDG was injected intravenously. Full-ring PET imaging was performed from the skull base to thigh after  the radiotracer. CT data was obtained and used for attenuation correction and anatomic localization.  Fasting blood glucose: 84 mg/dl  COMPARISON:  Chest CT 12/18/2017.  PET-CT 10/21/2017.  FINDINGS: Mediastinal blood pool activity: SUV max 1.6  NECK:  No hypermetabolic cervical lymph nodes are identified.There are no lesions of the pharyngeal mucosal space.  Incidental CT findings: Bilateral carotid atherosclerosis.  CHEST:  There are no hypermetabolic mediastinal, hilar or axillary lymph nodes. There is mildly increased metabolic activity in the right infrahilar region (SUV max 4.8) without corresponding focal abnormality. There has been partial clearing of the dense left lower lobe airspace disease demonstrated on the chest CT 12/18/2017. There is mild patchy hypermetabolic activity within the consolidated lung within SUV max of 5.7. There is new airspace disease in the lingula which is also mildly hypermetabolic. This has an SUV max of 5.9 centrally adjacent to the hilum and 5.5 peripherally.  Incidental CT findings: Right IJ central venous catheter, cardiac pacemaker and diffuse atherosclerosis again noted. No significant pleural or pericardial effusion.  ABDOMEN/PELVIS:  There is no hypermetabolic activity within the liver, adrenal glands, spleen or pancreas. There is no hypermetabolic nodal activity.  Incidental CT findings: There are stable postsurgical changes in the lower pole of the right kidney, aortic and branch vessel atherosclerosis and sigmoid diverticulosis.  SKELETON:  There is no hypermetabolic activity to suggest osseous metastatic disease.  Incidental CT findings: Mild multilevel spondylosis.  IMPRESSION: 1. No findings highly  suspicious for local recurrence or metastatic disease. 2. The left lower lobe consolidation seen on the recent chest CT has partially cleared. There is new patchy airspace opacity in the lingula. There is low-level hypermetabolic activity in these areas, again most consistent with inflammation, possibly related to radiation or infection. Chest CT follow-up in 3-6 months recommended. 3. Stable postsurgical changes in the right kidney.   Electronically Signed   By: Carey Bullocks M.D.   On: 01/12/2018 14:20   PET scan was personally reviewed today.  Assessment & Plan:    1. Renal cell carcinoma, right [facility]) S/p right partial nephrectomy in March 2017 [id number] -NED as of 01/12/18 PET/CT, low risk of recurrence  -Will repeat CT A/P in June 2020 -At 3 year interval, pt will no longer need repeat imaging -Will see the pt back in 6 months with CT A/P   2. OAB/ urinary incontinence -Symptoms stable, continue Mybetriq 50 mg    Return in about 6 months (around 12/15/2018) for CT A/P.  [facility] [address] [place], [place] [address] [phone]   I, Marcelline Mates, am acting as a scribe for Dr. Vanna Scotland,   I have reviewed the above documentation for accuracy and completeness, and I agree with the above.   Vanna Scotland, MD

## 2018-06-17 ENCOUNTER — Ambulatory Visit: Payer: Medicare Other

## 2018-06-18 DIAGNOSIS — H401133 Primary open-angle glaucoma, bilateral, severe stage: Secondary | ICD-10-CM | POA: Diagnosis not present

## 2018-06-19 ENCOUNTER — Other Ambulatory Visit: Payer: Self-pay | Admitting: Oncology

## 2018-06-23 ENCOUNTER — Ambulatory Visit
Admission: RE | Admit: 2018-06-23 | Discharge: 2018-06-23 | Disposition: A | Discharge status: Home / Self Care | Payer: Medicare Other | Source: Ambulatory Visit | Attending: Internal Medicine | Admitting: Internal Medicine

## 2018-06-23 DIAGNOSIS — Z1231 Encounter for screening mammogram for malignant neoplasm of breast: Secondary | ICD-10-CM | POA: Diagnosis not present

## 2018-06-23 HISTORY — DX: Malignant neoplasm of unspecified part of unspecified bronchus or lung: C34.90

## 2018-06-24 ENCOUNTER — Other Ambulatory Visit: Payer: Self-pay | Admitting: Internal Medicine

## 2018-06-24 DIAGNOSIS — R928 Other abnormal and inconclusive findings on diagnostic imaging of breast: Secondary | ICD-10-CM

## 2018-06-24 DIAGNOSIS — N6310 Unspecified lump in the right breast, unspecified quadrant: Secondary | ICD-10-CM

## 2018-07-09 ENCOUNTER — Ambulatory Visit
Admission: RE | Admit: 2018-07-09 | Discharge: 2018-07-09 | Disposition: A | Discharge status: Home / Self Care | Payer: Medicare Other | Source: Ambulatory Visit | Attending: Internal Medicine | Admitting: Internal Medicine

## 2018-07-09 DIAGNOSIS — R928 Other abnormal and inconclusive findings on diagnostic imaging of breast: Secondary | ICD-10-CM | POA: Insufficient documentation

## 2018-07-09 DIAGNOSIS — R922 Inconclusive mammogram: Secondary | ICD-10-CM | POA: Diagnosis not present

## 2018-07-09 DIAGNOSIS — N6310 Unspecified lump in the right breast, unspecified quadrant: Secondary | ICD-10-CM | POA: Diagnosis not present

## 2018-07-10 ENCOUNTER — Ambulatory Visit (INDEPENDENT_AMBULATORY_CARE_PROVIDER_SITE_OTHER): Payer: Medicare Other | Admitting: *Deleted

## 2018-07-10 DIAGNOSIS — E538 Deficiency of other specified B group vitamins: Secondary | ICD-10-CM

## 2018-07-10 MED ORDER — CYANOCOBALAMIN 1000 MCG/ML IJ SOLN
1000.0000 ug | Freq: Once | INTRAMUSCULAR | Status: AC
  Administered 2018-07-10: 1000 ug via INTRAMUSCULAR

## 2018-07-10 NOTE — Progress Notes (Signed)
Patient presented for B 12 injection to right deltoid, patient voiced no concerns nor showed any signs of distress during injection. 

## 2018-07-12 NOTE — Progress Notes (Signed)
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  HISTORY OF PRESENT ILLNESS: Patient returns to clinic today for further evaluation and discussion of her imaging results.  She is anxious, but otherwise feels well.  She does not complain of weakness or fatigue today.  She does not complain of shortness of breath today.  She denies any recent fevers or illnesses.  She does not complain of pain today. She has no neurologic complaints. She has a good appetite and denies weight loss. She denies any chest pain or hemoptysis.  She denies any nausea, vomiting, constipation, or diarrhea.  She has no urinary complaints.  Patient offers no further specific complaints today.  REVIEW OF SYSTEMS:    Review of Systems  Constitutional: Negative.  Negative for fever, malaise/fatigue and weight loss.  HENT: Negative.   Eyes: Negative.  Negative for blurred vision, double vision and pain.  Respiratory: Negative.  Negative for cough and shortness of breath.   Cardiovascular: Negative.  Negative for chest pain and leg swelling.  Gastrointestinal: Negative.  Negative for abdominal pain, diarrhea and nausea.  Genitourinary: Negative.  Negative for dysuria.  Musculoskeletal: Negative.  Negative for falls.  Skin: Negative.  Negative for rash.  Neurological: Negative.  Negative for sensory change, focal weakness, weakness and headaches.  Psychiatric/Behavioral: The patient is  nervous/anxious.   All other systems reviewed and are negative.   PAST MEDICAL HISTORY: Past Medical History:  Diagnosis Date   Anemia 02/2013   F/U with Dr. Grayland Ormond for Feraheme injections   Anxiety    Arthritis    Possible RA - Follow up appt with Dr. Jefm Bryant   Cellulitis of arm, right    Diabetes insipidus ([place])    On desmopressin   GERD (gastroesophageal reflux disease)    H/O seasonal allergies    H/O transfusion of packed red blood cells 02/2013   3 units PRBC for severe anemia 02/2013   Herniated disc    History of kidney stones    Hyperlipidemia    Hypertension    Hyperthyroidism    s/p radiation therapy, now hypothyroidism   IBS (irritable bowel syndrome)    Lung cancer ([place]) 06/2017   left   Migraine    Pacemaker    a. s/p successful implantation of a Medtronic CapSureFix Novus MRI SureScan serial # [id number] on 06/28/16 by Dr. Caryl Comes for sinus node dysfunction.   Pancreatic cyst    Paroxysmal A-fib [facility])    Renal cell cancer, right ([place]) 10/23/2015   Right partial nephrectomy.    PAST SURGICAL HISTORY: Past Surgical History:  Procedure Laterality Date   ABDOMINAL HYSTERECTOMY     BREAST EXCISIONAL BIOPSY Right 2005   neg   BREAST SURGERY  1990s   Biopsy   CERVICAL SPINE SURGERY     Bone fusion   COLONOSCOPY  2008?   [place]   COLONOSCOPY WITH PROPOFOL N/A 10/15/2016   Procedure: COLONOSCOPY WITH PROPOFOL;  Surgeon: Robert Bellow, MD;  [facility] ENDOSCOPY;  Service: Endoscopy;  Laterality: N/A;   EP IMPLANTABLE DEVICE N/A 06/28/2016   Procedure: Pacemaker Implant;  Surgeon: Deboraha Sprang, MD;  Location: [place] CV LAB;  Service: Cardiovascular;  Laterality: N/A;   HAMMER TOE SURGERY     HERNIA REPAIR Left    Inguinal Hernia Repair   PORTA CATH INSERTION N/A 07/24/2017   Procedure: PORTA CATH INSERTION;  Surgeon: Algernon Huxley, MD;  Location: [place] CV LAB;  Service: Cardiovascular;  Laterality:  N/A;   ROBOTIC ASSITED PARTIAL NEPHRECTOMY Right 10/23/2015   Procedure: ROBOTIC ASSITED PARTIAL NEPHRECTOMY with intraop ultrasound;  Surgeon: Hollice Espy, MD;  Location: [facility] ORS;  Service: Urology;  Laterality: Right;   TUBAL LIGATION      FAMILY HISTORY Family History  Problem Relation Age of Onset   Heart disease Mother    Heart disease Father    Diabetes Brother    Kidney disease Brother    Stroke Daughter        due to medication reaction   Breast cancer Cousin    Prostate cancer Neg Hx    Hematuria Neg Hx     ADVANCED DIRECTIVES:  No flowsheet data found.  HEALTH MAINTENANCE: Social History   Tobacco Use   Smoking status: Former Smoker    Packs/day: 0.50    Years: 30.00    Pack years: 15.00    Types: Cigarettes    Last attempt to quit: 12/03/2012    Years since quitting: 5.6   Smokeless tobacco: Never Used  Substance Use Topics   Alcohol use: No    Alcohol/week: 0.0 standard drinks   Drug use: No     Allergies  Allergen Reactions   Metrizamide     Other reaction(s): Other (See Comments)   Adhesive [Tape] Other (See Comments)    "irritate skin"   Augmentin [Amoxicillin-Pot Clavulanate] Diarrhea    Severe diarrhea   Codeine     REACTION: NAUSEA   Morphine And Related Other (See Comments)    Shut down her organs   Other     Skin irritation   Tetanus Toxoid     REACTION: ARM SWELLING,REDNESS   Tramadol Nausea And Vomiting    Current Outpatient Medications  Medication Sig Dispense Refill   acetaminophen (TYLENOL) 325 MG tablet Take 2 tablets (650 mg total) by mouth every 6 (six) hours as needed for pain. (Patient taking differently: Take 650 mg by mouth every 6 (six) hours as needed for pain. )     albuterol (PROVENTIL HFA;VENTOLIN HFA) 108 (90 Base) MCG/ACT inhaler Inhale 2 puffs into the lungs every 6 (six) hours as needed for wheezing or shortness of breath. 1 Inhaler 2   ALPRAZolam (XANAX) 0.25 MG tablet TAKE ONE TABLET  BY MOUTH TWICE A DAY AS NEEDED FOR ANXIETY 60 tablet 5   brimonidine (ALPHAGAN) 0.2 % ophthalmic solution Place 1 drop into both eyes 2 (two) times daily.     Calcium Carbonate-Vitamin D (CALCIUM-CARB 600 + D) 600-125 MG-UNIT TABS Take 2 tablets by mouth daily.     carvedilol (COREG) 6.25 MG tablet Take 1 tablet (6.25 mg total) by mouth 2 (two) times daily. 180 tablet 2   cholecalciferol (VITAMIN D) 1000 units tablet Take 1,000 Units by mouth daily.     dorzolamide (TRUSOPT) 2 % ophthalmic solution Place 1 drop into both eyes 2 (two) times daily.      ferrous sulfate 325 (65 FE) MG tablet Take 325 mg by mouth daily with breakfast.  furosemide (LASIX) 20 MG tablet Take 0.5 tablets (10 mg total) by mouth daily. 45 tablet 4   hydroxychloroquine (PLAQUENIL) 200 MG tablet Take 400 mg by mouth daily.      latanoprost (XALATAN) 0.005 % ophthalmic solution Place 1 drop into both eyes at bedtime.      levothyroxine (SYNTHROID, LEVOTHROID) 112 MCG tablet TAKE ONE TABLET BY MOUTH EVERY MORNING WITH FOOD 90 tablet 0   losartan (COZAAR) 50 MG tablet TAKE ONE TABLET BY MOUTH DAILY 90 tablet 1   mirabegron ER (MYRBETRIQ) 50 MG TB24 tablet Take 1 tablet (50 mg total) by mouth daily. 30 tablet 11   mirtazapine (REMERON) 15 MG tablet TAKE ONE TABLET BY MOUTH EVERY NIGHT AT BEDTIME 30 tablet 0   nitroGLYCERIN (NITROLINGUAL) 0.4 MG/SPRAY spray Place 1 spray under the tongue every 5 (five) minutes x 3 doses as needed for chest pain. 4.9 g 0   Omega-3 Fatty Acids (FISH OIL) 1000 MG CAPS Take 1,000 mg by mouth daily.      pantoprazole (PROTONIX) 40 MG tablet TAKE ONE TABLET BY MOUTH DAILY 30 tablet 5   potassium chloride SA (K-DUR,KLOR-CON) 20 MEQ tablet Take 1 tablet (20 mEq total) by mouth daily. 30 tablet 3   timolol (TIMOPTIC) 0.5 % ophthalmic solution Place 1 drop into both eyes 2 (two) times daily.      vitamin C (ASCORBIC ACID) 500 MG tablet Take 500 mg by mouth daily.     gabapentin  (NEURONTIN) 300 MG capsule TAKE ONE CAPSULE BY MOUTH THREE TIMES A DAY AS NEEDED FOR SHINGLES PAIN 90 capsule 0   No current facility-administered medications for this visit.     OBJECTIVE:  Vitals:   07/14/18 1102  BP: (!) 172/78  Pulse: 76  Resp: 18  Temp: 97.7 F (36.5 C)  SpO2: 95%     Body mass index is 26.28 kg/m.    ECOG FS:0 - Asymptomatic  Physical Exam   General: Well-developed, well-nourished, no acute distress. Eyes: Pink conjunctiva, anicteric sclera. HEENT: Normocephalic, moist mucous membranes. Lungs: Clear to auscultation bilaterally. Heart: Regular rate and rhythm. No rubs, murmurs, or gallops. Abdomen: Soft, nontender, nondistended. No organomegaly noted, normoactive bowel sounds. Musculoskeletal: No edema, cyanosis, or clubbing. Neuro: Alert, answering all questions appropriately. Cranial nerves grossly intact. Skin: No rashes or petechiae noted. Psych: Normal affect.  LAB RESULTS:  Hospital Outpatient Visit on 07/13/2018  Component Date Value Ref Range Status   Creatinine, Ser 07/13/2018 0.70  0.44 - 1.00 mg/dL Final       STUDIES: Ct Chest W Contrast  Result Date: 07/13/2018 CLINICAL DATA:  Restaging lung cancer. EXAM: CT CHEST WITH CONTRAST TECHNIQUE: Multidetector CT imaging of the chest was performed during intravenous contrast administration. CONTRAST:  52mL ISOVUE-300 IOPAMIDOL (ISOVUE-300) INJECTION 61% COMPARISON:  01/12/2018 FINDINGS: Cardiovascular: The heart size appears enlarged. Aortic atherosclerosis. Calcifications in the LAD, left circumflex coronary arteries noted. Mediastinum/Nodes: No axillary or supraclavicular adenopathy. The trachea appears patent and is midline. Normal appearance of the esophagus. Subcarinal lymph node measures 1 cm and is not significantly changed when compared with 12/18/2017. Left hilar lymph node measures 1 cm and is also not significantly changed in the interval. Lungs/Pleura: Moderate changes of emphysema.  Left lower lobe fibrosis and masslike architectural distortion is identified. Previous posterior subpleural consolidation within the left lower lobe has resolved in the interval. There has been improved aeration to the left upper lobe with resolution of previous ground-glass attenuation involving the lingula. No suspicious pulmonary nodules  or masses identified within the remaining portions of the lungs. Upper Abdomen: No acute abnormality. Musculoskeletal: Multi level thoracic spondylosis identified. The appearance is similar to previous exam. No definite foci of bone metastases identified. IMPRESSION: 1. Fibrosis and masslike architectural distortion involving the left lower lobe is again noted compatible with changes secondary to external beam radiation. 2. No definite evidence to suggest metastatic disease within the chest. 3. Interval resolution of previous lingular ground-glass attenuation and posterior left lung base airspace consolidation compatible with inflammatory/infectious pneumonitis. 4. Aortic Atherosclerosis (ICD10-I70.0) and Emphysema (ICD10-J43.9). 5. Coronary artery atherosclerotic calcifications. Electronically Signed   By: Kerby Moors M.D.   On: 07/13/2018 14:18   Mm Diag Breast Tomo Uni Right  Result Date: 07/09/2018 CLINICAL DATA:  77 year old female for further evaluation of possible RIGHT breast mass on screening mammogram EXAM: DIGITAL DIAGNOSTIC UNILATERAL RIGHT MAMMOGRAM WITH CAD AND TOMO COMPARISON:  Previous exam(s). ACR Breast Density Category c: The breast tissue is heterogeneously dense, which may obscure small masses. FINDINGS: 2D/3D spot compression views of the RIGHT breast demonstrate effacement of the screening study finding. No persistent abnormality in this area identified. Mammographic images were processed with CAD. IMPRESSION: No persistent abnormality in the area of the screening study finding. RECOMMENDATION: Bilateral screening mammogram in 1 year I have discussed  the findings and recommendations with the patient. Results were also provided in writing at the conclusion of the visit. If applicable, a reminder letter will be sent to the patient regarding the next appointment. BI-RADS CATEGORY  1: Negative. Electronically Signed   By: Margarette Canada M.D.   On: 07/09/2018 10:57   Mm 3d Screen Breast Bilateral  Result Date: 06/24/2018 CLINICAL DATA:  Screening. EXAM: DIGITAL SCREENING BILATERAL MAMMOGRAM WITH TOMO AND CAD COMPARISON:  Previous exam(s). ACR Breast Density Category c: The breast tissue is heterogeneously dense, which may obscure small masses. FINDINGS: In the right breast, a possible mass warrants further evaluation. In the left breast, no findings suspicious for malignancy. Images were processed with CAD. IMPRESSION: Further evaluation is suggested for possible mass in the right breast. RECOMMENDATION: Diagnostic mammogram and possibly ultrasound of the right breast. (Code:[id number]) The patient will be contacted regarding the findings, and additional imaging will be scheduled. BI-RADS CATEGORY  0: Incomplete. Need additional imaging evaluation and/or prior mammograms for comparison. Electronically Signed   By: Ammie Ferrier M.D.   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  Plan:  1. 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  Patient completed cycle 3 of carboplatinum and etoposide on September 19, 2017.  She also completed XRT.  Given her persistent pancytopenia, treatment was discontinued.  CT scan results from July 13, 2018 reviewed independently and report as above with no definitive evidence of recurrent or metastatic disease.  No interventions are needed at this time.  Return to clinic in 6 months with repeat imaging and further evaluation.   2.  Pancytopenia: Nearly resolved.  Previously, bone  marrow biopsy revealed no evidence of MDS or other abnormality.  It is possible it is related to her Plaquenil or rheumatoid arthritis. The remainder of her laboratory work was either negative or within normal limits.   3. Renal cell carcinoma of the right kidney: Stage T1a N0 M0.  Patient underwent partial nephrectomy on October 23, 2015.  Imaging results as above.  Continue follow-up with urology as indicated. 4.  Rheumatoid arthritis: Continue Plaquenil. Continue follow-up per rheumatology.   Patient expressed understanding and was in agreement with this plan. She also understands that She can call clinic at any time with any questions, concerns, or complaints.    Lloyd Huger, MD   07/16/2018 9:23 PM

## 2018-07-13 ENCOUNTER — Other Ambulatory Visit: Payer: Self-pay | Admitting: Internal Medicine

## 2018-07-13 ENCOUNTER — Ambulatory Visit
Admission: RE | Admit: 2018-07-13 | Discharge: 2018-07-13 | Disposition: A | Discharge status: Home / Self Care | Payer: Medicare Other | Source: Ambulatory Visit | Attending: Oncology | Admitting: Oncology

## 2018-07-13 ENCOUNTER — Inpatient Hospital Stay: Payer: Medicare Other | Attending: Oncology

## 2018-07-13 DIAGNOSIS — C7A090 Malignant carcinoid tumor of the bronchus and lung: Secondary | ICD-10-CM | POA: Diagnosis not present

## 2018-07-13 DIAGNOSIS — I10 Essential (primary) hypertension: Secondary | ICD-10-CM | POA: Insufficient documentation

## 2018-07-13 DIAGNOSIS — Z87891 Personal history of nicotine dependence: Secondary | ICD-10-CM | POA: Insufficient documentation

## 2018-07-13 DIAGNOSIS — M069 Rheumatoid arthritis, unspecified: Secondary | ICD-10-CM | POA: Insufficient documentation

## 2018-07-13 DIAGNOSIS — Z85528 Personal history of other malignant neoplasm of kidney: Secondary | ICD-10-CM | POA: Diagnosis not present

## 2018-07-13 DIAGNOSIS — Z923 Personal history of irradiation: Secondary | ICD-10-CM | POA: Insufficient documentation

## 2018-07-13 DIAGNOSIS — Z79899 Other long term (current) drug therapy: Secondary | ICD-10-CM | POA: Diagnosis not present

## 2018-07-13 DIAGNOSIS — Z95828 Presence of other vascular implants and grafts: Secondary | ICD-10-CM

## 2018-07-13 DIAGNOSIS — C7A8 Other malignant neuroendocrine tumors: Secondary | ICD-10-CM

## 2018-07-13 DIAGNOSIS — Z9221 Personal history of antineoplastic chemotherapy: Secondary | ICD-10-CM | POA: Insufficient documentation

## 2018-07-13 DIAGNOSIS — C3490 Malignant neoplasm of unspecified part of unspecified bronchus or lung: Secondary | ICD-10-CM | POA: Diagnosis not present

## 2018-07-13 DIAGNOSIS — Z905 Acquired absence of kidney: Secondary | ICD-10-CM | POA: Insufficient documentation

## 2018-07-13 LAB — POCT I-STAT CREATININE: Creatinine, Ser: 0.7 mg/dL (ref 0.44–1.00)

## 2018-07-13 MED ORDER — IOPAMIDOL (ISOVUE-300) INJECTION 61%
60.0000 mL | Freq: Once | INTRAVENOUS | Status: AC | PRN
  Administered 2018-07-13: 60 mL via INTRAVENOUS

## 2018-07-13 MED ORDER — SODIUM CHLORIDE 0.9% FLUSH
10.0000 mL | Freq: Once | INTRAVENOUS | Status: AC
  Administered 2018-07-13: 10 mL via INTRAVENOUS
  Filled 2018-07-13: qty 10

## 2018-07-13 MED ORDER — HEPARIN SOD (PORK) LOCK FLUSH 100 UNIT/ML IV SOLN
500.0000 [IU] | Freq: Once | INTRAVENOUS | Status: AC
  Administered 2018-07-13: 500 [IU] via INTRAVENOUS

## 2018-07-14 ENCOUNTER — Encounter: Payer: Self-pay | Admitting: Oncology

## 2018-07-14 ENCOUNTER — Other Ambulatory Visit: Payer: Self-pay

## 2018-07-14 ENCOUNTER — Inpatient Hospital Stay (HOSPITAL_BASED_OUTPATIENT_CLINIC_OR_DEPARTMENT_OTHER): Payer: Medicare Other | Admitting: Oncology

## 2018-07-14 VITALS — BP 172/78 | HR 76 | Temp 97.7°F | Resp 18 | Wt 143.7 lb

## 2018-07-14 DIAGNOSIS — M069 Rheumatoid arthritis, unspecified: Secondary | ICD-10-CM

## 2018-07-14 DIAGNOSIS — Z79899 Other long term (current) drug therapy: Secondary | ICD-10-CM

## 2018-07-14 DIAGNOSIS — Z905 Acquired absence of kidney: Secondary | ICD-10-CM

## 2018-07-14 DIAGNOSIS — Z87891 Personal history of nicotine dependence: Secondary | ICD-10-CM | POA: Diagnosis not present

## 2018-07-14 DIAGNOSIS — Z923 Personal history of irradiation: Secondary | ICD-10-CM

## 2018-07-14 DIAGNOSIS — C7A090 Malignant carcinoid tumor of the bronchus and lung: Secondary | ICD-10-CM

## 2018-07-14 DIAGNOSIS — I10 Essential (primary) hypertension: Secondary | ICD-10-CM

## 2018-07-14 DIAGNOSIS — Z9221 Personal history of antineoplastic chemotherapy: Secondary | ICD-10-CM | POA: Diagnosis not present

## 2018-07-14 DIAGNOSIS — C7A8 Other malignant neuroendocrine tumors: Secondary | ICD-10-CM

## 2018-07-14 DIAGNOSIS — Z85528 Personal history of other malignant neoplasm of kidney: Secondary | ICD-10-CM

## 2018-07-14 DIAGNOSIS — C641 Malignant neoplasm of right kidney, except renal pelvis: Secondary | ICD-10-CM

## 2018-07-14 NOTE — Progress Notes (Signed)
Patient here today for follow up regarding lung cancer. Patient denies concerns today.

## 2018-07-15 ENCOUNTER — Ambulatory Visit: Payer: Medicare Other | Admitting: Oncology

## 2018-07-15 NOTE — Telephone Encounter (Signed)
Refilled: 05/27/2018 Last OV: 04/14/2018 Next OV: 08/21/2018

## 2018-07-20 ENCOUNTER — Ambulatory Visit: Payer: Self-pay

## 2018-07-20 ENCOUNTER — Encounter: Payer: Self-pay | Admitting: Radiation Oncology

## 2018-07-20 ENCOUNTER — Other Ambulatory Visit: Payer: Self-pay

## 2018-07-20 ENCOUNTER — Ambulatory Visit
Admission: RE | Admit: 2018-07-20 | Discharge: 2018-07-20 | Disposition: A | Discharge status: Home / Self Care | Payer: Medicare Other | Source: Ambulatory Visit | Attending: Radiation Oncology | Admitting: Radiation Oncology

## 2018-07-20 VITALS — BP 182/91 | HR 74 | Temp 98.3°F | Resp 18 | Wt 137.6 lb

## 2018-07-20 DIAGNOSIS — Z8589 Personal history of malignant neoplasm of other organs and systems: Secondary | ICD-10-CM | POA: Insufficient documentation

## 2018-07-20 DIAGNOSIS — Z923 Personal history of irradiation: Secondary | ICD-10-CM | POA: Diagnosis not present

## 2018-07-20 DIAGNOSIS — Z87891 Personal history of nicotine dependence: Secondary | ICD-10-CM | POA: Diagnosis not present

## 2018-07-20 DIAGNOSIS — C7A8 Other malignant neuroendocrine tumors: Secondary | ICD-10-CM

## 2018-07-20 NOTE — Progress Notes (Signed)
Radiation Oncology Follow up Note  Name: Caroline Nichols   Date:   07/20/2018 MRN:  [id number] DOB: 09/13/1940    This 77 y.o. female presents to the clinic today for ten-month follow-up status post hypofractionated course radiation therapy to her left lung for stage IIB large cell neuroendocrine carcinoma.  REFERRING PROVIDER: [name], MD  HPI: patient is a 77 year old female now out 10 months having completed I M RT radiation therapy in a hypofractionated regiment to her left lung for stage IIB large cell neuroendocrine carcinoma. She is seen today in routine follow-up is doing well. She specifically denies cough hemoptysis or chest tightness..she recently had a CT scan of her chest showing architectural distortion compatible with radiation changes no definitive evidence to suggest metastatic disease. Also there was resolution of lingular [place] Wien glass attenuation in the posterior left lung base compatible with inflammatory in nature.patient has had some problems with pancytopenia although that seems to have resolved.  COMPLICATIONS OF TREATMENT: none  FOLLOW UP COMPLIANCE: keeps appointments   PHYSICAL EXAM:  BP (!) 182/91 (BP Location: Left Arm, Patient Position: Sitting)   Pulse 74   Temp 98.3 F (36.8 C) (Tympanic)   Resp 18   Wt 137 lb 9.1 oz (62.4 kg)   BMI 25.16 kg/m  Well-developed well-nourished patient in NAD. HEENT reveals PERLA, EOMI, discs not visualized.  Oral cavity is clear. No oral mucosal lesions are identified. Neck is clear without evidence of cervical or supraclavicular adenopathy. Lungs are clear to A&P. Cardiac examination is essentially unremarkable with regular rate and rhythm without murmur rub or thrill. Abdomen is benign with no organomegaly or masses noted. Motor sensory and DTR levels are equal and symmetric in the upper and lower extremities. Cranial nerves II through XII are grossly intact. Proprioception is intact. No peripheral adenopathy or edema is  identified. No motor or sensory levels are noted. Crude visual fields are within normal range.  RADIOLOGY RESULTS: CT scans reviewed and compatible with the above-stated findings  PLAN: present time patient is doing well with no evidence of disease. I'm please were overall progress. I've asked to see her back in 1 year for follow-up. She continues close follow-up care with medical oncology.  I would like to take this opportunity to thank you for allowing me to participate in the care of your patient.Noreene Filbert, MD

## 2018-07-20 NOTE — Telephone Encounter (Signed)
Pt called stating that her BP has been elevated at the last 3 professional office visits she has had. Today she saw Dr Donella Stade and her BP was 182/91.  Caroline Nichols states that her medication for BP has been changed recently and she is concerned.  She states she has had nose bleeds and mild HA as well. Appointment scheduled per protocol. Care advice read to patient. Pt verbalized understanding of all instructions.   Reason for Disposition . Systolic BP  >= 768 OR Diastolic >= 088  Answer Assessment - Initial Assessment Questions 1. BLOOD PRESSURE: "What is the blood pressure?" "Did you take at least two measurements 5 minutes apart?"     182/91 2. ONSET: "When did you take your blood pressure?"     Last 3 professional bp checks several weeks 3. HOW: "How did you obtain the blood pressure?" (e.g., visiting nurse, automatic home BP monitor)     doctor 4. HISTORY: "Do you have a history of high blood pressure?"     yes 5. MEDICATIONS: "Are you taking any medications for blood pressure?" "Have you missed any doses recently?"     Medications adjusted recently 6. OTHER SYMPTOMS: "Do you have any symptoms?" (e.g., headache, chest pain, blurred vision, difficulty breathing, weakness)     Headaches nose bleed 7. PREGNANCY: "Is there any chance you are pregnant?" "When was your last menstrual period?"     N/A  Protocols used: HIGH BLOOD PRESSURE-A-AH

## 2018-07-21 ENCOUNTER — Encounter: Payer: Self-pay | Admitting: Family Medicine

## 2018-07-21 ENCOUNTER — Ambulatory Visit (INDEPENDENT_AMBULATORY_CARE_PROVIDER_SITE_OTHER): Payer: Medicare Other | Admitting: Family Medicine

## 2018-07-21 VITALS — BP 132/68 | HR 73 | Temp 98.3°F | Ht 62.0 in | Wt 138.0 lb

## 2018-07-21 DIAGNOSIS — I10 Essential (primary) hypertension: Secondary | ICD-10-CM | POA: Diagnosis not present

## 2018-07-21 NOTE — Patient Instructions (Signed)
BP looks good today  Keep your eye on BP readings, if starts to trend high again we will take another look at your medications

## 2018-07-21 NOTE — Progress Notes (Signed)
Subjective:    Patient ID: Caroline Nichols, female    DOB: [birth year], 77 y.o.   MRN: [id number]  HPI  Presents to clinic to discuss hypertension  Currently on coreg 6.25 mg BID, and losartan 54m Qd for BP contol. Also takes lasix 147mQD.  Patient has had elevated blood pressure readings at her last few doctor visits, but also has had a very busy and stressful week.  She met with her oncologist and had PET scan.  Patient states that these visits were nerve-racking.  However, she is happy to report that as of yesterday she was told she is cancer free.  Patient also states she tried taking her blood pressure medicine a little bit earlier this morning to see if the timing of the dose made any difference.   Denies CP, SOB, feeling faint/dizzy, palpitations or leg swelling.   Recent labs reviewed: CBC Latest Ref Rng & Units 01/09/2018 12/31/2017 12/20/2017  WBC 4.0 - 10.5 K/uL 6.7 6.7 7.1  Hemoglobin 12.0 - 15.0 g/dL 10.0(L) 9.5(L) 8.4(L)  Hematocrit 36.0 - 46.0 % 31.4(L) 29.4(L) 25.2(L)  Platelets 150.0 - 400.0 K/uL 144.0(L) 167.0 150   CMP Latest Ref Rng & Units 07/13/2018 01/12/2018 01/09/2018  Glucose 65 - 99 mg/dL - 91 76  BUN 6 - 20 mg/dL - 15 15  Creatinine 0.44 - 1.00 mg/dL 0.70 0.76 0.78  Sodium 135 - 145 mmol/L - 142 139  Potassium 3.5 - 5.1 mmol/L - 3.8 3.8  Chloride 101 - 111 mmol/L - 108 104  CO2 22 - 32 mmol/L - 25 25  Calcium 8.9 - 10.3 mg/dL - 9.1 9.3  Total Protein 6.5 - 8.1 g/dL - 7.7 -  Total Bilirubin 0.3 - 1.2 mg/dL - 0.7 -  Alkaline Phos 38 - 126 U/L - 49 -  AST 15 - 41 U/L - 21 -  ALT 14 - 54 U/L - 12(L) -     Patient Active Problem List   Diagnosis Date Noted  . Sinusitis, acute, maxillary 04/15/2018  . GERD (gastroesophageal reflux disease) 02/20/2018  . Night sweats 01/11/2018  . Diarrhea in adult patient 01/11/2018  . Hypotension 01/11/2018  . Chronic respiratory failure ([facility]05/30/2019  . PNA (pneumonia) 12/19/2017  . Statin intolerance 10/16/2017    . B12 deficiency 10/16/2017  . Concussion 07/20/2017  . Neuroendocrine carcinoma of lung ([facility]12/07/2017  . Special screening for malignant neoplasms, colon 09/11/2016  . Encounter for Medicare annual wellness exam 07/21/2016  . Vitamin D deficiency 07/21/2016  . Pacemaker   . Sinus node dysfunction ([facility]10/19/2017  . Fatigue 01/13/2016  . History of atrial fibrillation 10/28/2015  . Renal cell carcinoma of right kidney ([facility]03/20/2017  . Goals of care, counseling/discussion 10/12/2015  . Urinary frequency 07/28/2015  . Inflammatory arthritis 05/10/2015  . Nocturia 04/16/2015  . Hyperlipidemia 12/17/2014  . Depression 12/14/2014  . Xerostomia 11/14/2014  . S/P hysterectomy 06/09/2014  . Atherosclerotic peripheral vascular disease with intermittent claudication ([facility]03/12/2013  . Edema of both legs 10/07/2013  . Venous stasis dermatitis 10/07/2013  . Chest pain 03/26/2013  . Left carotid bruit 03/26/2013  . Acquired pancytopenia ([facility]08/16/2014  . Bradycardia 12/09/2012  . Cervical vertebral fusion 12/09/2012  . Gastrointestinal hemorrhage 12/09/2012  . Neuropathy 12/09/2012  . History of tobacco abuse 12/09/2012  . IBS (irritable bowel syndrome)   . Degenerative arthritis of lumbar spine   . Mild mitral regurgitation 09/27/2009  . Postablative hypothyroidism 05/15/2009  . Anxiety state 05/15/2009  .  Essential hypertension 05/15/2009   Social History   Tobacco Use  . Smoking status: Former Smoker    Packs/day: 0.50    Years: 30.00    Pack years: 15.00    Types: Cigarettes    Last attempt to quit: 12/03/2012    Years since quitting: 5.6  . Smokeless tobacco: Never Used  Substance Use Topics  . Alcohol use: No    Alcohol/week: 0.0 standard drinks   Review of Systems  Constitutional: Negative for chills, fatigue and fever.  HENT: Negative for congestion, ear pain, sinus pain and sore throat.   Eyes: Negative.   Respiratory: Negative for cough, shortness of  breath and wheezing.   Cardiovascular: Negative for chest pain, palpitations and leg swelling.  Gastrointestinal: Negative for abdominal pain, diarrhea, nausea and vomiting.  Genitourinary: Negative for dysuria, frequency and urgency.  Musculoskeletal: Negative for arthralgias and myalgias.  Skin: Negative for color change, pallor and rash.  Neurological: Negative for syncope, light-headedness and headaches.  Psychiatric/Behavioral: The patient is not nervous/anxious.       Objective:   Physical Exam Vitals signs and nursing note reviewed.  Constitutional:      General: She is not in acute distress.    Appearance: Normal appearance. She is not toxic-appearing.  HENT:     Head: Normocephalic and atraumatic.     Right Ear: Tympanic membrane and ear canal normal.     Left Ear: Tympanic membrane and ear canal normal.     Mouth/Throat:     Mouth: Mucous membranes are moist.  Eyes:     Extraocular Movements: Extraocular movements intact.     Conjunctiva/sclera: Conjunctivae normal.  Neck:     Musculoskeletal: Normal range of motion and neck supple.  Cardiovascular:     Rate and Rhythm: Normal rate and regular rhythm.     Heart sounds: Normal heart sounds.  Pulmonary:     Effort: Pulmonary effort is normal. No respiratory distress.     Breath sounds: Normal breath sounds.  Musculoskeletal:     Right lower leg: No edema.     Left lower leg: No edema.  Skin:    General: Skin is warm and dry.  Neurological:     Mental Status: She is alert and oriented to person, place, and time.     Comments: Walks with cane  Psychiatric:        Mood and Affect: Mood normal.        Behavior: Behavior normal.       BP Readings from Last 3 Encounters:  07/20/18 (!) 182/91  07/14/18 (!) 172/78  06/16/18 (!) 180/97   Today's Vitals   07/21/18 0947  BP: 132/68  Pulse: 73  Temp: 98.3 F (36.8 C)  TempSrc: Oral  SpO2: 93%  Weight: 138 lb (62.6 kg)  Height: '5\' 2"'  (1.575 m)   Body mass  index is 25.24 kg/m.  Assessment & Plan:    Essential hypertension - blood pressure reading is very good today.  Discussed with patient that with today's reading, I would be fearful of adding or changing any of her doses due to her possibly dropping too low.  Patient understands this.  She is wondering if her blood pressure was higher last week due to being under a lot of stress with all of her oncology appointments.  Patient also thinks taking her blood pressure medication little bit earlier this morning allowed the medication some more time to have effect.   Patient will keep an eye on  her blood pressure readings.  She is aware that if her blood pressure readings to begin to trend high again, she can call office and let us know and we can reassess her medications at that time.  Patient will keep regularly scheduled follow-up as planned with PCP.  She is aware she can return to clinic anytime if issues arise.

## 2018-07-28 ENCOUNTER — Other Ambulatory Visit: Payer: Self-pay | Admitting: Oncology

## 2018-07-28 ENCOUNTER — Other Ambulatory Visit: Payer: Self-pay | Admitting: Internal Medicine

## 2018-07-28 NOTE — Telephone Encounter (Signed)
Pt calling the office back tried calling no answer

## 2018-07-28 NOTE — Telephone Encounter (Signed)
Tried to call patient, but VM box full.

## 2018-07-30 NOTE — Telephone Encounter (Signed)
Refilled: 01/31/2018 Last OV: 04/14/2018 Next OV: 08/21/2018

## 2018-07-31 ENCOUNTER — Telehealth: Payer: Self-pay | Admitting: Internal Medicine

## 2018-07-31 MED ORDER — ALPRAZOLAM 0.25 MG PO TABS: ORAL_TABLET | ORAL | 1 refills | Status: DC

## 2018-07-31 NOTE — Telephone Encounter (Signed)
Refilled alprazolam 

## 2018-07-31 NOTE — Telephone Encounter (Signed)
Patient notified that script was sent.

## 2018-07-31 NOTE — Telephone Encounter (Signed)
alpazolam refilled

## 2018-07-31 NOTE — Telephone Encounter (Signed)
Copied from [place] [id number]. Topic: Quick Communication - Rx Refill/Question >> Jul 31, 2018 11:08 AM Blase Mess A wrote: Medication: ALPRAZolam Duanne Moron) 0.25 MG tablet [[id number]]   Has the patient contacted their pharmacy? Yes  (Agent: If no, request that the patient contact the pharmacy for the refill.) (Agent: If yes, when and what did the pharmacy advise?) [place], [place] [phone] (Phone) [phone] (Fax)   Preferred Pharmacy (with phone number or street name):   Agent: Please be advised that RX refills may take up to 3 business days. We ask that you follow-up with your pharmacy.

## 2018-08-14 LAB — CUP PACEART REMOTE DEVICE CHECK
Battery Remaining Longevity: 103 mo
Battery Voltage: 3.02 V
Brady Statistic AP VP Percent: 0.02 %
Brady Statistic AP VS Percent: 7.03 %
Brady Statistic AS VS Percent: 92.9 %
Brady Statistic RA Percent Paced: 7.05 %
Brady Statistic RV Percent Paced: 0.07 %
Date Time Interrogation Session: 20191107205137
Implantable Lead Implant Date: 20171124
Implantable Lead Implant Date: 20171124
Implantable Lead Location: 753859
Implantable Lead Location: 753860
Implantable Lead Model: 5076
Implantable Lead Model: 5076
Lead Channel Impedance Value: 304 Ohm
Lead Channel Impedance Value: 361 Ohm
Lead Channel Impedance Value: 399 Ohm
Lead Channel Pacing Threshold Amplitude: 0.75 V
Lead Channel Pacing Threshold Amplitude: 0.875 V
Lead Channel Pacing Threshold Pulse Width: 0.4 ms
Lead Channel Pacing Threshold Pulse Width: 0.4 ms
Lead Channel Sensing Intrinsic Amplitude: 0.875 mV
Lead Channel Sensing Intrinsic Amplitude: 0.875 mV
Lead Channel Sensing Intrinsic Amplitude: 11.5 mV
Lead Channel Sensing Intrinsic Amplitude: 11.5 mV
Lead Channel Setting Pacing Amplitude: 2 V
Lead Channel Setting Pacing Amplitude: 2.5 V
Lead Channel Setting Sensing Sensitivity: 0.9 mV
MDC IDC MSMT LEADCHNL RV IMPEDANCE VALUE: 494 Ohm
MDC IDC PG IMPLANT DT: 20171124
MDC IDC SET LEADCHNL RV PACING PULSEWIDTH: 0.4 ms
MDC IDC STAT BRADY AS VP PERCENT: 0.05 %

## 2018-08-20 ENCOUNTER — Other Ambulatory Visit: Payer: Self-pay | Admitting: Internal Medicine

## 2018-08-21 ENCOUNTER — Ambulatory Visit (INDEPENDENT_AMBULATORY_CARE_PROVIDER_SITE_OTHER): Payer: Medicare Other | Admitting: Internal Medicine

## 2018-08-21 ENCOUNTER — Encounter: Payer: Self-pay | Admitting: Internal Medicine

## 2018-08-21 VITALS — BP 126/74 | HR 71 | Temp 98.2°F | Resp 16 | Ht 62.0 in | Wt 141.0 lb

## 2018-08-21 DIAGNOSIS — E538 Deficiency of other specified B group vitamins: Secondary | ICD-10-CM | POA: Diagnosis not present

## 2018-08-21 DIAGNOSIS — M47816 Spondylosis without myelopathy or radiculopathy, lumbar region: Secondary | ICD-10-CM | POA: Diagnosis not present

## 2018-08-21 DIAGNOSIS — R5383 Other fatigue: Secondary | ICD-10-CM

## 2018-08-21 DIAGNOSIS — D696 Thrombocytopenia, unspecified: Secondary | ICD-10-CM

## 2018-08-21 DIAGNOSIS — F411 Generalized anxiety disorder: Secondary | ICD-10-CM | POA: Diagnosis not present

## 2018-08-21 LAB — CBC WITH DIFFERENTIAL/PLATELET
Basophils Absolute: 0 10*3/uL (ref 0.0–0.1)
Basophils Relative: 0.7 % (ref 0.0–3.0)
Eosinophils Absolute: 0 10*3/uL (ref 0.0–0.7)
Eosinophils Relative: 0.4 % (ref 0.0–5.0)
HCT: 36.1 % (ref 36.0–46.0)
Hemoglobin: 12.2 g/dL (ref 12.0–15.0)
Lymphocytes Relative: 31.9 % (ref 12.0–46.0)
Lymphs Abs: 1.2 10*3/uL (ref 0.7–4.0)
MCHC: 33.8 g/dL (ref 30.0–36.0)
MCV: 91.6 fl (ref 78.0–100.0)
MONO ABS: 1.3 10*3/uL — AB (ref 0.1–1.0)
Monocytes Relative: 36.1 % — ABNORMAL HIGH (ref 3.0–12.0)
Neutro Abs: 1.1 10*3/uL — ABNORMAL LOW (ref 1.4–7.7)
Neutrophils Relative %: 30.9 % — ABNORMAL LOW (ref 43.0–77.0)
Platelets: 95 10*3/uL — ABNORMAL LOW (ref 150.0–400.0)
RBC: 3.95 Mil/uL (ref 3.87–5.11)
RDW: 15.1 % (ref 11.5–15.5)
WBC: 3.7 10*3/uL — ABNORMAL LOW (ref 4.0–10.5)

## 2018-08-21 LAB — COMPREHENSIVE METABOLIC PANEL
ALT: 18 U/L (ref 0–35)
AST: 35 U/L (ref 0–37)
Albumin: 4 g/dL (ref 3.5–5.2)
Alkaline Phosphatase: 55 U/L (ref 39–117)
BUN: 13 mg/dL (ref 6–23)
CO2: 27 mEq/L (ref 19–32)
Calcium: 9.7 mg/dL (ref 8.4–10.5)
Chloride: 105 mEq/L (ref 96–112)
Creatinine, Ser: 0.81 mg/dL (ref 0.40–1.20)
GFR: 82.94 mL/min (ref >60.00)
Glucose, Bld: 87 mg/dL (ref 70–99)
Potassium: 3.9 mEq/L (ref 3.5–5.1)
Sodium: 140 mEq/L (ref 135–145)
Total Bilirubin: 0.4 mg/dL (ref 0.2–1.2)
Total Protein: 7.7 g/dL (ref 6.0–8.3)

## 2018-08-21 LAB — TSH: TSH: 3.02 u[IU]/mL (ref 0.35–4.50)

## 2018-08-21 MED ORDER — CYANOCOBALAMIN 1000 MCG/ML IJ SOLN
1000.0000 ug | Freq: Once | INTRAMUSCULAR | Status: AC
  Administered 2018-08-21: 1000 ug via INTRAMUSCULAR

## 2018-08-21 MED ORDER — PAROXETINE HCL 10 MG PO TABS: 10.0000 mg | ORAL_TABLET | Freq: Every day | ORAL | 5 refills | Status: DC

## 2018-08-21 NOTE — Patient Instructions (Addendum)
  You can add up to 2000 mg of acetominophen (tylenol) every day safely  In divided doses (500 mg every 6 hours  Or  625 mg every 8 hours .) to treat your joint pain   You can use 1/2 alprazolam if needed to go back to sleep,  But I want you to reduce your daytime use to once daily  I want you to start taking generic Paxil for your anxiety Please start the medication at 1/2 tablet daily in the evening for the first few days to avoid nausea.  You can increase to a full tablet after 4 days if you have  not developed side effects of nausea.  If thelmedication  interferes with your sleep, take it in the morning instead  Please return in  4 weeks ,  Or e mail me to let me know how it is helping your depression

## 2018-08-21 NOTE — Progress Notes (Signed)
Subjective:  Patient ID: Caroline Nichols, female    DOB: 1941/01/02  Age: 78 y.o. MRN: [id number]  CC: The primary encounter diagnosis was Fatigue, unspecified type. Diagnoses of Thrombocytopenia ([place]), B12 deficiency, Osteoarthritis of lumbar spine, unspecified spinal osteoarthritis complication status, and Anxiety state were also pertinent to this visit.  HPI [name] presents for follow up on multiple issues.  She was last seen in office on on  Dec 17 by LG for concerns about uncontrolled  Hypertension.   No changes to medication were made since the elevated reading was an isolated event and occurred in the setting of seeing her oncologist for follow up on partially treated lung cancer. .   Her treatment for neuroendocrine carcinoma of the lung , Stage IIB was abbreviated due to  Persistent  Thrombocytopenia.  She has no recurrence by CT scan done in Dec 2019  6 month follow up planned   Patient is taking her medications as prescribed and notes no adverse effects.  Home BP readings have been done daily since Dec 17th and are  generally < 130/80 .  She is avoiding added salt in her diet and walking daily for ten minute intervals .  Joint pain has been worse lately.  Using tylenol .   Right hip yesterday was aching in the morning but improved with activity and a hot shower   Intercostal muscles on the left aching but getting better.  She had been moving boxes in her home which she believes strained them.   She notes improved symptoms if she keeps moving.   .  Hypokalemia: Has not taken potassium supplement in a week or now  eaitng bananas  Continues to suffer from insomnia every other night despite using 15 mg remeron .  Aggravated by nocturia ,  Can't go back to sleep . Has not tried using her alprazolam then,  Uses it to avoid getting aggravated and nervous during the day. Averages two doses daily. .  Outpatient Medications Prior to Visit  Medication Sig Dispense Refill  . acetaminophen  (TYLENOL) 325 MG tablet Take 2 tablets (650 mg total) by mouth every 6 (six) hours as needed for pain. (Patient taking differently: Take 650 mg by mouth every 6 (six) hours as needed for pain. )    . albuterol (PROVENTIL HFA;VENTOLIN HFA) 108 (90 Base) MCG/ACT inhaler Inhale 2 puffs into the lungs every 6 (six) hours as needed for wheezing or shortness of breath. 1 Inhaler 2  . ALPRAZolam (XANAX) 0.25 MG tablet TAKE ONE TABLET BY MOUTH TWICE A DAY AS NEEDED FOR ANXIETY 60 tablet 1  . brimonidine (ALPHAGAN) 0.2 % ophthalmic solution Place 1 drop into both eyes 2 (two) times daily.    . Calcium Carbonate-Vitamin D (CALCIUM-CARB 600 + D) 600-125 MG-UNIT TABS Take 2 tablets by mouth daily.    . carvedilol (COREG) 6.25 MG tablet Take 1 tablet (6.25 mg total) by mouth 2 (two) times daily. 180 tablet 2  . cholecalciferol (VITAMIN D) 1000 units tablet Take 1,000 Units by mouth daily.    . dorzolamide (TRUSOPT) 2 % ophthalmic solution Place 1 drop into both eyes 2 (two) times daily.     . ferrous sulfate 325 (65 FE) MG tablet Take 325 mg by mouth daily with breakfast.    . furosemide (LASIX) 20 MG tablet Take 0.5 tablets (10 mg total) by mouth daily. 45 tablet 4  . gabapentin (NEURONTIN) 300 MG capsule TAKE ONE CAPSULE BY MOUTH THREE TIMES  A DAY AS NEEDED FOR SHINGLES PAIN 90 capsule 1  . hydroxychloroquine (PLAQUENIL) 200 MG tablet Take 400 mg by mouth daily.     Marland Kitchen latanoprost (XALATAN) 0.005 % ophthalmic solution Place 1 drop into both eyes at bedtime.     Marland Kitchen levothyroxine (SYNTHROID, LEVOTHROID) 112 MCG tablet TAKE ONE TABLET BY MOUTH EVERY MORNING WITH FOOD 90 tablet 0  . losartan (COZAAR) 50 MG tablet TAKE ONE TABLET BY MOUTH DAILY 90 tablet 1  . mirabegron ER (MYRBETRIQ) 50 MG TB24 tablet Take 1 tablet (50 mg total) by mouth daily. 30 tablet 11  . mirtazapine (REMERON) 15 MG tablet TAKE ONE TABLET BY MOUTH EVERY NIGHT AT BEDTIME 30 tablet 2  . nitroGLYCERIN (NITROLINGUAL) 0.4 MG/SPRAY spray Place 1  spray under the tongue every 5 (five) minutes x 3 doses as needed for chest pain. 4.9 g 0  . Omega-3 Fatty Acids (FISH OIL) 1000 MG CAPS Take 1,000 mg by mouth daily.     . pantoprazole (PROTONIX) 40 MG tablet TAKE ONE TABLET BY MOUTH DAILY 30 tablet 5  . timolol (TIMOPTIC) 0.5 % ophthalmic solution Place 1 drop into both eyes 2 (two) times daily.     . vitamin C (ASCORBIC ACID) 500 MG tablet Take 500 mg by mouth daily.    . potassium chloride SA (K-DUR,KLOR-CON) 20 MEQ tablet Take 1 tablet (20 mEq total) by mouth daily. 30 tablet 3   No facility-administered medications prior to visit.     Review of Systems;  Patient denies headache, fevers, malaise, unintentional weight loss, skin rash, eye pain, sinus congestion and sinus pain, sore throat, dysphagia,  hemoptysis , cough, dyspnea, wheezing, chest pain, palpitations, orthopnea, edema, abdominal pain, nausea, melena, diarrhea, constipation, flank pain, dysuria, hematuria, urinary  Frequency, nocturia, numbness, tingling, seizures,  Focal weakness, Loss of consciousness,  Tremor, , and suicidal ideation.      Objective:  BP 126/74 (BP Location: Left Arm, Patient Position: Sitting, Cuff Size: Normal)   Pulse 71   Temp 98.2 F (36.8 C) (Oral)   Resp 16   Ht 5\' 2"  (1.575 m)   Wt 141 lb (64 kg)   SpO2 97%   BMI 25.79 kg/m   BP Readings from Last 3 Encounters:  08/21/18 126/74  07/21/18 132/68  07/20/18 (!) 182/91    Wt Readings from Last 3 Encounters:  08/21/18 141 lb (64 kg)  07/21/18 138 lb (62.6 kg)  07/20/18 137 lb 9.1 oz (62.4 kg)    General appearance: calm, cooperative and appears stated age Neck: no adenopathy, no carotid bruit, supple, symmetrical, trachea midline and thyroid not enlarged, symmetric, no tenderness/mass/nodules Back: symmetric, no curvature. ROM normal. No CVA tenderness. Lungs: clear to auscultation bilaterally Heart: regular rate and rhythm, S1, S2 normal, no murmur, click, rub or gallop Abdomen:  soft, non-tender; bowel sounds normal; no masses,  no organomegaly Pulses: 2+ and symmetric Skin: Skin color, texture, turgor normal. No rashes or lesions Lymph nodes: Cervical, supraclavicular, and axillary nodes normal. Psych: affect anxious, makes good eye contact. No fidgeting,  Smiles easily.  Denies suicidal thoughts    Lab Results  Component Value Date   HGBA1C 5.2 01/09/2018   HGBA1C 5.3 06/09/2014   HGBA1C 5.2 12/27/2012    Lab Results  Component Value Date   CREATININE 0.81 08/21/2018   CREATININE 0.70 07/13/2018   CREATININE 0.76 01/12/2018    Lab Results  Component Value Date   WBC 3.7 (L) 08/21/2018   HGB 12.2 08/21/2018  HCT 36.1 08/21/2018   PLT 95.0 (L) 08/21/2018   GLUCOSE 87 08/21/2018   CHOL 166 11/26/2017   TRIG 78 11/26/2017   HDL 60 11/26/2017   LDLCALC 89 11/26/2017   ALT 18 08/21/2018   AST 35 08/21/2018   NA 140 08/21/2018   K 3.9 08/21/2018   CL 105 08/21/2018   CREATININE 0.81 08/21/2018   BUN 13 08/21/2018   CO2 27 08/21/2018   TSH 3.02 08/21/2018   INR 1.20 12/19/2017   HGBA1C 5.2 01/09/2018   MICROALBUR 0.7 06/09/2014    No results found.  Assessment & Plan:   Problem List Items Addressed This Visit    Anxiety state    With insomnia . Aggravated by health issues (multiple cancers).  advised her to limit use of alprazolam to one daily dose.  And encouraged her to try Paxil, continue remeron. Ok to use prn  alprazolam  For nocturnal wakeups.         Relevant Medications   PARoxetine (PAXIL) 10 MG tablet   B12 deficiency    Managed with IM injections .  One was given today      Relevant Medications   cyanocobalamin ((VITAMIN B-12)) injection 1,000 mcg (Completed)   Degenerative arthritis of lumbar spine    Managed with placquenil and tylenol. Advised to maximize tylenol use to 2000 mg daily for non radiating symptoms.       Fatigue - Primary   Relevant Orders   TSH (Completed)   Comprehensive metabolic panel (Completed)     Other Visit Diagnoses    Thrombocytopenia ([place])       Relevant Orders   CBC with Differential/Platelet (Completed)    A total of 25 minutes of face to face time was spent with patient more than half of which was spent in counselling about the above mentioned conditions  and coordination of care   I have discontinued Roselin Wiemann. Raigoza's potassium chloride SA. I am also having her start on PARoxetine. Additionally, I am having her maintain her Fish Oil, acetaminophen, vitamin C, ferrous sulfate, brimonidine, hydroxychloroquine, latanoprost, dorzolamide, timolol, nitroGLYCERIN, cholecalciferol, Calcium Carbonate-Vitamin D, albuterol, mirabegron ER, carvedilol, furosemide, pantoprazole, levothyroxine, mirtazapine, ALPRAZolam, losartan, and gabapentin. We administered cyanocobalamin.  Meds ordered this encounter  Medications  . PARoxetine (PAXIL) 10 MG tablet    Sig: Take 1 tablet (10 mg total) by mouth daily. After dinner    Dispense:  30 tablet    Refill:  5  . cyanocobalamin ((VITAMIN B-12)) injection 1,000 mcg    Medications Discontinued During This Encounter  Medication Reason  . potassium chloride SA (K-DUR,KLOR-CON) 20 MEQ tablet     Follow-up: Return in about 4 weeks (around 09/18/2018) for depression/anxiety.   [name], MD

## 2018-08-23 NOTE — Assessment & Plan Note (Signed)
Managed with IM injections .  One was given today

## 2018-08-23 NOTE — Assessment & Plan Note (Addendum)
Managed with placquenil and tylenol. Advised to maximize tylenol use to 2000 mg daily for non radiating symptoms.

## 2018-08-23 NOTE — Assessment & Plan Note (Signed)
With insomnia . Aggravated by health issues (multiple cancers).  advised her to limit use of alprazolam to one daily dose.  And encouraged her to try Paxil, continue remeron. Ok to use prn  alprazolam  For nocturnal wakeups.

## 2018-09-01 ENCOUNTER — Inpatient Hospital Stay: Payer: Medicare Other | Attending: Oncology

## 2018-09-01 DIAGNOSIS — Z452 Encounter for adjustment and management of vascular access device: Secondary | ICD-10-CM | POA: Insufficient documentation

## 2018-09-01 DIAGNOSIS — C7A090 Malignant carcinoid tumor of the bronchus and lung: Secondary | ICD-10-CM | POA: Insufficient documentation

## 2018-09-01 DIAGNOSIS — Z85528 Personal history of other malignant neoplasm of kidney: Secondary | ICD-10-CM | POA: Insufficient documentation

## 2018-09-01 DIAGNOSIS — Z9221 Personal history of antineoplastic chemotherapy: Secondary | ICD-10-CM | POA: Diagnosis not present

## 2018-09-01 DIAGNOSIS — Z905 Acquired absence of kidney: Secondary | ICD-10-CM | POA: Insufficient documentation

## 2018-09-01 DIAGNOSIS — Z923 Personal history of irradiation: Secondary | ICD-10-CM | POA: Diagnosis not present

## 2018-09-01 DIAGNOSIS — Z95828 Presence of other vascular implants and grafts: Secondary | ICD-10-CM

## 2018-09-01 MED ORDER — SODIUM CHLORIDE 0.9% FLUSH
10.0000 mL | INTRAVENOUS | Status: DC | PRN
  Administered 2018-09-01: 10 mL via INTRAVENOUS
  Filled 2018-09-01: qty 10

## 2018-09-01 MED ORDER — HEPARIN SOD (PORK) LOCK FLUSH 100 UNIT/ML IV SOLN
500.0000 [IU] | Freq: Once | INTRAVENOUS | Status: AC
  Administered 2018-09-01: 500 [IU] via INTRAVENOUS
  Filled 2018-09-01: qty 5

## 2018-09-08 ENCOUNTER — Other Ambulatory Visit: Payer: Self-pay | Admitting: Internal Medicine

## 2018-09-10 ENCOUNTER — Ambulatory Visit (INDEPENDENT_AMBULATORY_CARE_PROVIDER_SITE_OTHER): Payer: Medicare Other

## 2018-09-10 DIAGNOSIS — R001 Bradycardia, unspecified: Secondary | ICD-10-CM

## 2018-09-10 DIAGNOSIS — I495 Sick sinus syndrome: Secondary | ICD-10-CM | POA: Diagnosis not present

## 2018-09-11 LAB — CUP PACEART REMOTE DEVICE CHECK
Battery Remaining Longevity: 101 mo
Battery Voltage: 3.02 V
Brady Statistic AP VP Percent: 0.01 %
Brady Statistic AP VS Percent: 9.27 %
Brady Statistic AS VS Percent: 90.67 %
Brady Statistic RV Percent Paced: 0.06 %
Date Time Interrogation Session: 20200206203638
Implantable Lead Implant Date: 20171124
Implantable Lead Location: 753859
Implantable Pulse Generator Implant Date: 20171124
Lead Channel Impedance Value: 342 Ohm
Lead Channel Impedance Value: 380 Ohm
Lead Channel Impedance Value: 399 Ohm
Lead Channel Impedance Value: 494 Ohm
Lead Channel Pacing Threshold Amplitude: 0.75 V
Lead Channel Pacing Threshold Amplitude: 1 V
Lead Channel Pacing Threshold Pulse Width: 0.4 ms
Lead Channel Sensing Intrinsic Amplitude: 0.75 mV
Lead Channel Sensing Intrinsic Amplitude: 0.75 mV
Lead Channel Sensing Intrinsic Amplitude: 12 mV
Lead Channel Sensing Intrinsic Amplitude: 12 mV
Lead Channel Setting Pacing Amplitude: 2 V
Lead Channel Setting Pacing Amplitude: 2.5 V
Lead Channel Setting Pacing Pulse Width: 0.4 ms
Lead Channel Setting Sensing Sensitivity: 0.9 mV
MDC IDC LEAD IMPLANT DT: 20171124
MDC IDC LEAD LOCATION: 753860
MDC IDC MSMT LEADCHNL RV PACING THRESHOLD PULSEWIDTH: 0.4 ms
MDC IDC STAT BRADY AS VP PERCENT: 0.05 %
MDC IDC STAT BRADY RA PERCENT PACED: 9.28 %

## 2018-09-18 ENCOUNTER — Ambulatory Visit (INDEPENDENT_AMBULATORY_CARE_PROVIDER_SITE_OTHER): Payer: Medicare Other | Admitting: Internal Medicine

## 2018-09-18 ENCOUNTER — Encounter: Payer: Self-pay | Admitting: Internal Medicine

## 2018-09-18 VITALS — BP 150/84 | HR 69 | Temp 98.3°F | Resp 14 | Ht 62.0 in | Wt 135.8 lb

## 2018-09-18 DIAGNOSIS — M1990 Unspecified osteoarthritis, unspecified site: Secondary | ICD-10-CM | POA: Diagnosis not present

## 2018-09-18 DIAGNOSIS — E538 Deficiency of other specified B group vitamins: Secondary | ICD-10-CM | POA: Diagnosis not present

## 2018-09-18 DIAGNOSIS — F322 Major depressive disorder, single episode, severe without psychotic features: Secondary | ICD-10-CM

## 2018-09-18 MED ORDER — CYANOCOBALAMIN 1000 MCG/ML IJ SOLN
1000.0000 ug | Freq: Once | INTRAMUSCULAR | Status: AC
  Administered 2018-09-18: 1000 ug via INTRAMUSCULAR

## 2018-09-18 NOTE — Patient Instructions (Addendum)
Stop the paxil for one week.  If  The loose stools resolve,  Stay off of  The Paxil for a total of two weeks,  And then resume  the paxil at half tablet at night after meal.  If diarrhea retursn,  Then we will stop  Paxil forever  And LET me know so I can prescribe an Alternative   If stopping the Paxil does NOT resolve the diarrhea,  Please let me know    Happy valentines's Day!!!!

## 2018-09-18 NOTE — Progress Notes (Signed)
Subjective:  Patient ID: Caroline Nichols, female    DOB: [birth year]  Age: 78 y.o. MRN: [id number]  CC: The primary encounter diagnosis was B12 deficiency. Diagnoses of Current severe episode of major depressive disorder without psychotic features, unspecified whether recurrent ([place]) and Inflammatory arthritis were also pertinent to this visit.  HPI Caroline Nichols presents for follow up on depression complicated by/anxiety  .  Seen one month ago and Paxil prescribed  Advised to reduce use of alprazolam to once daily  And prn early waking .  She has reduced her use of alprazolam dose to 1/2 tablet daily  ,  Waking up after 5-6 hours of sleep  On that dose . Has been having loose stools since starting paxil   Eye doctor Dr. Danelle Earthly suggested tht the partial  loss of vision in left eye may be related to gabapentin use  bc she has glaucoma  (patient also taking placquenil)   Rheum has reduced  Placquenil dose but her pain has become  greater on the lower dose   Outpatient Medications Prior to Visit  Medication Sig Dispense Refill  . acetaminophen (TYLENOL) 325 MG tablet Take 2 tablets (650 mg total) by mouth every 6 (six) hours as needed for pain. (Patient taking differently: Take 650 mg by mouth every 6 (six) hours as needed for pain. )    . albuterol (PROVENTIL HFA;VENTOLIN HFA) 108 (90 Base) MCG/ACT inhaler Inhale 2 puffs into the lungs every 6 (six) hours as needed for wheezing or shortness of breath. 1 Inhaler 2  . ALPRAZolam (XANAX) 0.25 MG tablet TAKE ONE TABLET BY MOUTH TWICE A DAY AS NEEDED FOR ANXIETY 60 tablet 1  . brimonidine (ALPHAGAN) 0.2 % ophthalmic solution Place 1 drop into both eyes 2 (two) times daily.    . Calcium Carbonate-Vitamin D (CALCIUM-CARB 600 + D) 600-125 MG-UNIT TABS Take 2 tablets by mouth daily.    . carvedilol (COREG) 6.25 MG tablet Take 1 tablet (6.25 mg total) by mouth 2 (two) times daily. 180 tablet 2  . cholecalciferol (VITAMIN D) 1000 units tablet Take 1,000 Units  by mouth daily.    . dorzolamide (TRUSOPT) 2 % ophthalmic solution Place 1 drop into both eyes 2 (two) times daily.     . ferrous sulfate 325 (65 FE) MG tablet Take 325 mg by mouth daily with breakfast.    . furosemide (LASIX) 20 MG tablet Take 0.5 tablets (10 mg total) by mouth daily. 45 tablet 4  . gabapentin (NEURONTIN) 300 MG capsule TAKE ONE CAPSULE BY MOUTH THREE TIMES A DAY AS NEEDED FOR SHINGLES PAIN 90 capsule 1  . hydroxychloroquine (PLAQUENIL) 200 MG tablet Take 400 mg by mouth daily.     Marland Kitchen latanoprost (XALATAN) 0.005 % ophthalmic solution Place 1 drop into both eyes at bedtime.     Marland Kitchen levothyroxine (SYNTHROID, LEVOTHROID) 112 MCG tablet TAKE ONE TABLET BY MOUTH EVERY MORNING 90 tablet 0  . losartan (COZAAR) 50 MG tablet TAKE ONE TABLET BY MOUTH DAILY 90 tablet 1  . mirabegron ER (MYRBETRIQ) 50 MG TB24 tablet Take 1 tablet (50 mg total) by mouth daily. 30 tablet 11  . mirtazapine (REMERON) 15 MG tablet TAKE ONE TABLET BY MOUTH EVERY NIGHT AT BEDTIME 30 tablet 2  . nitroGLYCERIN (NITROLINGUAL) 0.4 MG/SPRAY spray Place 1 spray under the tongue every 5 (five) minutes x 3 doses as needed for chest pain. 4.9 g 0  . Omega-3 Fatty Acids (FISH OIL) 1000 MG CAPS Take 1,000  mg by mouth daily.     . pantoprazole (PROTONIX) 40 MG tablet TAKE ONE TABLET BY MOUTH DAILY 30 tablet 5  . PARoxetine (PAXIL) 10 MG tablet Take 1 tablet (10 mg total) by mouth daily. After dinner 30 tablet 5  . timolol (TIMOPTIC) 0.25 % ophthalmic solution     . timolol (TIMOPTIC) 0.5 % ophthalmic solution Place 1 drop into both eyes 2 (two) times daily.     . vitamin C (ASCORBIC ACID) 500 MG tablet Take 500 mg by mouth daily.     No facility-administered medications prior to visit.     Review of Systems;  Patient denies headache, fevers, malaise, unintentional weight loss, skin rash, eye pain, sinus congestion and sinus pain, sore throat, dysphagia,  hemoptysis , cough, dyspnea, wheezing, chest pain, palpitations,  orthopnea, edema, abdominal pain, nausea, melena, diarrhea, constipation, flank pain, dysuria, hematuria, urinary  Frequency, nocturia, numbness, tingling, seizures,  Focal weakness, Loss of consciousness,  Tremor, insomnia, depression, anxiety, and suicidal ideation.      Objective:  BP (!) 150/84 (BP Location: Left Arm, Patient Position: Sitting, Cuff Size: Normal)   Pulse 69   Temp 98.3 F (36.8 C) (Oral)   Resp 14   Ht 5\' 2"  (1.575 m)   Wt 135 lb 12.8 oz (61.6 kg)   SpO2 97%   BMI 24.84 kg/m   BP Readings from Last 3 Encounters:  09/18/18 (!) 150/84  08/21/18 126/74  07/21/18 132/68    Wt Readings from Last 3 Encounters:  09/18/18 135 lb 12.8 oz (61.6 kg)  08/21/18 141 lb (64 kg)  07/21/18 138 lb (62.6 kg)    General appearance: alert, cooperative and appears stated age Ears: normal TM's and external ear canals both ears Throat: lips, mucosa, and tongue normal; teeth and gums normal Neck: no adenopathy, no carotid bruit, supple, symmetrical, trachea midline and thyroid not enlarged, symmetric, no tenderness/mass/nodules Back: symmetric, no curvature. ROM normal. No CVA tenderness. Lungs: clear to auscultation bilaterally Heart: regular rate and rhythm, S1, S2 normal, no murmur, click, rub or gallop Abdomen: soft, non-tender; bowel sounds normal; no masses,  no organomegaly Pulses: 2+ and symmetric Skin: Skin color, texture, turgor normal. No rashes or lesions Lymph nodes: Cervical, supraclavicular, and axillary nodes normal.  Lab Results  Component Value Date   HGBA1C 5.2 01/09/2018   HGBA1C 5.3 06/09/2014   HGBA1C 5.2 12/27/2012    Lab Results  Component Value Date   CREATININE 0.81 08/21/2018   CREATININE 0.70 07/13/2018   CREATININE 0.76 01/12/2018    Lab Results  Component Value Date   WBC 3.7 (L) 08/21/2018   HGB 12.2 08/21/2018   HCT 36.1 08/21/2018   PLT 95.0 (L) 08/21/2018   GLUCOSE 87 08/21/2018   CHOL 166 11/26/2017   TRIG 78 11/26/2017    HDL 60 11/26/2017   LDLCALC 89 11/26/2017   ALT 18 08/21/2018   AST 35 08/21/2018   NA 140 08/21/2018   K 3.9 08/21/2018   CL 105 08/21/2018   CREATININE 0.81 08/21/2018   BUN 13 08/21/2018   CO2 27 08/21/2018   TSH 3.02 08/21/2018   INR 1.20 12/19/2017   HGBA1C 5.2 01/09/2018   MICROALBUR 0.7 06/09/2014    No results found.  Assessment & Plan:   Problem List Items Addressed This Visit    Major depressive disorder with current active episode    She has been having loose stools since starting Paxil  . Advised to suspend the medication for two  weeks and resume for another trial if the diarrhea resolves. If the diarrhea resolves but recurs when she resumes paxil,  We will change medication.       Inflammatory arthritis    Managed with Placquenil by Rheumatology.  Recent dose reduction not tolerated due to increased pain       B12 deficiency - Primary   Relevant Medications   cyanocobalamin ((VITAMIN B-12)) injection 1,000 mcg (Completed)      I am having Caroline Nichols maintain her Fish Oil, acetaminophen, vitamin C, ferrous sulfate, brimonidine, hydroxychloroquine, latanoprost, dorzolamide, timolol, nitroGLYCERIN, cholecalciferol, Calcium Carbonate-Vitamin D, albuterol, mirabegron ER, carvedilol, furosemide, pantoprazole, mirtazapine, ALPRAZolam, losartan, gabapentin, PARoxetine, levothyroxine, and timolol. We administered cyanocobalamin.  Meds ordered this encounter  Medications  . cyanocobalamin ((VITAMIN B-12)) injection 1,000 mcg    There are no discontinued medications.  Follow-up: Return in about 3 months (around 12/17/2018).   [name], MD

## 2018-09-19 NOTE — Assessment & Plan Note (Addendum)
She has been having loose stools since starting Paxil  . Advised to suspend the medication for two weeks and resume for another trial if the diarrhea resolves. If the diarrhea resolves but recurs when she resumes paxil,  We will change medication.

## 2018-09-19 NOTE — Assessment & Plan Note (Signed)
Managed with Placquenil by Rheumatology.  Recent dose reduction not tolerated due to increased pain

## 2018-09-21 NOTE — Progress Notes (Signed)
Remote pacemaker transmission.   

## 2018-09-24 DIAGNOSIS — H401133 Primary open-angle glaucoma, bilateral, severe stage: Secondary | ICD-10-CM | POA: Diagnosis not present

## 2018-09-25 ENCOUNTER — Other Ambulatory Visit: Payer: Self-pay | Admitting: Internal Medicine

## 2018-09-29 DIAGNOSIS — R748 Abnormal levels of other serum enzymes: Secondary | ICD-10-CM | POA: Diagnosis not present

## 2018-09-29 DIAGNOSIS — D72819 Decreased white blood cell count, unspecified: Secondary | ICD-10-CM | POA: Diagnosis not present

## 2018-09-29 DIAGNOSIS — M0579 Rheumatoid arthritis with rheumatoid factor of multiple sites without organ or systems involvement: Secondary | ICD-10-CM | POA: Diagnosis not present

## 2018-09-29 DIAGNOSIS — G629 Polyneuropathy, unspecified: Secondary | ICD-10-CM | POA: Diagnosis not present

## 2018-10-07 ENCOUNTER — Other Ambulatory Visit: Payer: Self-pay | Admitting: Internal Medicine

## 2018-10-07 DIAGNOSIS — M79674 Pain in right toe(s): Secondary | ICD-10-CM | POA: Diagnosis not present

## 2018-10-07 DIAGNOSIS — B351 Tinea unguium: Secondary | ICD-10-CM | POA: Diagnosis not present

## 2018-10-07 DIAGNOSIS — M79675 Pain in left toe(s): Secondary | ICD-10-CM | POA: Diagnosis not present

## 2018-10-07 NOTE — Telephone Encounter (Signed)
Last OV 09/18/2018  Last refilled 07/31/2018 disp 60 with 1 refill   Sent to PCP for approval

## 2018-10-20 ENCOUNTER — Other Ambulatory Visit: Payer: Self-pay

## 2018-10-20 ENCOUNTER — Ambulatory Visit: Payer: Medicare Other | Admitting: Occupational Therapy

## 2018-10-20 ENCOUNTER — Inpatient Hospital Stay: Payer: Medicare Other | Attending: Oncology

## 2018-10-20 DIAGNOSIS — Z905 Acquired absence of kidney: Secondary | ICD-10-CM | POA: Insufficient documentation

## 2018-10-20 DIAGNOSIS — Z923 Personal history of irradiation: Secondary | ICD-10-CM | POA: Diagnosis not present

## 2018-10-20 DIAGNOSIS — Z452 Encounter for adjustment and management of vascular access device: Secondary | ICD-10-CM | POA: Diagnosis not present

## 2018-10-20 DIAGNOSIS — C7A090 Malignant carcinoid tumor of the bronchus and lung: Secondary | ICD-10-CM | POA: Insufficient documentation

## 2018-10-20 DIAGNOSIS — Z9221 Personal history of antineoplastic chemotherapy: Secondary | ICD-10-CM | POA: Diagnosis not present

## 2018-10-20 DIAGNOSIS — Z85528 Personal history of other malignant neoplasm of kidney: Secondary | ICD-10-CM | POA: Diagnosis not present

## 2018-10-20 DIAGNOSIS — Z95828 Presence of other vascular implants and grafts: Secondary | ICD-10-CM

## 2018-10-20 MED ORDER — HEPARIN SOD (PORK) LOCK FLUSH 100 UNIT/ML IV SOLN
500.0000 [IU] | Freq: Once | INTRAVENOUS | Status: AC
  Administered 2018-10-20: 500 [IU] via INTRAVENOUS

## 2018-10-20 MED ORDER — SODIUM CHLORIDE 0.9% FLUSH
10.0000 mL | Freq: Once | INTRAVENOUS | Status: AC
  Administered 2018-10-20: 10 mL via INTRAVENOUS
  Filled 2018-10-20: qty 10

## 2018-10-21 ENCOUNTER — Ambulatory Visit (INDEPENDENT_AMBULATORY_CARE_PROVIDER_SITE_OTHER): Payer: Medicare Other

## 2018-10-21 DIAGNOSIS — E538 Deficiency of other specified B group vitamins: Secondary | ICD-10-CM

## 2018-10-21 MED ORDER — CYANOCOBALAMIN 1000 MCG/ML IJ SOLN
1000.0000 ug | Freq: Once | INTRAMUSCULAR | Status: AC
  Administered 2018-10-21: 1000 ug via INTRAMUSCULAR

## 2018-10-21 NOTE — Progress Notes (Signed)
Caroline Nichols presents today for injection per MD orders. B12 injection administered IM in right Upper Arm. Administration without incident. Patient tolerated well.  Rogerick Baldwin,cma

## 2018-10-25 ENCOUNTER — Other Ambulatory Visit: Payer: Self-pay | Admitting: Internal Medicine

## 2018-11-04 ENCOUNTER — Other Ambulatory Visit: Payer: Self-pay | Admitting: Internal Medicine

## 2018-11-04 ENCOUNTER — Other Ambulatory Visit: Payer: Self-pay | Admitting: Oncology

## 2018-11-04 NOTE — Telephone Encounter (Signed)
Refilled: 10/07/2018 Last OV: 09/18/2018 Next OV: 12/18/2018

## 2018-11-17 ENCOUNTER — Ambulatory Visit: Payer: Medicare Other | Admitting: Occupational Therapy

## 2018-11-17 ENCOUNTER — Other Ambulatory Visit: Payer: Self-pay | Admitting: Internal Medicine

## 2018-11-24 ENCOUNTER — Ambulatory Visit: Payer: Medicare Other

## 2018-12-01 ENCOUNTER — Other Ambulatory Visit: Payer: Self-pay | Admitting: Internal Medicine

## 2018-12-01 NOTE — Telephone Encounter (Signed)
Patient would like PCP to be aware she has 2 pills left of gabapentin (NEURONTIN) 300 MG capsule, patient informed it may take 48 to 72 hour turn around.   [place], [place] [phone] (Phone) [phone] (Fax)

## 2018-12-07 ENCOUNTER — Other Ambulatory Visit: Payer: Self-pay

## 2018-12-08 ENCOUNTER — Inpatient Hospital Stay: Payer: Medicare Other

## 2018-12-09 ENCOUNTER — Telehealth: Payer: Self-pay

## 2018-12-09 NOTE — Telephone Encounter (Signed)

## 2018-12-11 ENCOUNTER — Encounter: Payer: Self-pay | Admitting: Cardiovascular Disease

## 2018-12-11 ENCOUNTER — Other Ambulatory Visit: Payer: Self-pay

## 2018-12-11 ENCOUNTER — Telehealth (INDEPENDENT_AMBULATORY_CARE_PROVIDER_SITE_OTHER): Payer: Medicare Other | Admitting: Cardiovascular Disease

## 2018-12-11 DIAGNOSIS — Z950 Presence of cardiac pacemaker: Secondary | ICD-10-CM

## 2018-12-11 DIAGNOSIS — R5383 Other fatigue: Secondary | ICD-10-CM | POA: Diagnosis not present

## 2018-12-11 DIAGNOSIS — E785 Hyperlipidemia, unspecified: Secondary | ICD-10-CM

## 2018-12-11 DIAGNOSIS — R079 Chest pain, unspecified: Secondary | ICD-10-CM | POA: Diagnosis not present

## 2018-12-11 DIAGNOSIS — I10 Essential (primary) hypertension: Secondary | ICD-10-CM

## 2018-12-11 DIAGNOSIS — R001 Bradycardia, unspecified: Secondary | ICD-10-CM

## 2018-12-11 DIAGNOSIS — I70219 Atherosclerosis of native arteries of extremities with intermittent claudication, unspecified extremity: Secondary | ICD-10-CM

## 2018-12-11 DIAGNOSIS — I495 Sick sinus syndrome: Secondary | ICD-10-CM

## 2018-12-11 MED ORDER — NITROGLYCERIN 0.4 MG/SPRAY TL SOLN: 1.0000 | 3 refills | Status: AC | PRN

## 2018-12-11 NOTE — Progress Notes (Signed)
Virtual Visit via Telephone Note   This visit type was conducted due to national recommendations for restrictions regarding the COVID-19 Pandemic (e.g. social distancing) in an effort to limit this patient's exposure and mitigate transmission in our community.  Due to her co-morbid illnesses, this patient is at least at moderate risk for complications without adequate follow up.  This format is felt to be most appropriate for this patient at this time.  The patient did not have access to video technology/had technical difficulties with video requiring transitioning to audio format only (telephone).  All issues noted in this document were discussed and addressed.  No physical exam could be performed with this format.  Please refer to the patient's chart for her  consent to telehealth for [facility].   I connected with  Caroline Nichols on 12/11/18 by a video enabled telemedicine application and verified that I am speaking with the correct person using two identifiers. I discussed the limitations of evaluation and management by telemedicine. The patient expressed understanding and agreed to proceed.   Evaluation Performed:  Follow-up visit  Date:  12/11/2018   ID:  Caroline Nichols, DOB Aug 12, 1940, MRN [id number]  Patient Location:  [address] [place] [address]   Provider location:   [facility], [place] office  PCP:  Sherlene Shams, MD  Cardiologist:  Fonnie Mu   Chief Complaint:  Gait instability    History of Present Illness:    Caroline Nichols is a 78 y.o. female who presents via audio/video conferencing for a telehealth visit today.   The patient does not symptoms concerning for COVID-19 infection (fever, chills, cough, or new SHORTNESS OF BREATH).   Patient has a past medical history of Pancytopenia, Renal cell carcinoma of right kidney, Right partial nephrectomy 10/2015 Plaquenil for her rheumatoid arthritis sinus node dysfunction, pacer  November 2017, postop hematoma Stress Myoview August 2017 with no ischemia Coronary artery disease and aortic atherosclerosis on CT scan History of smoking,  IBS,  hypertension,  anemia with prior blood transfusions ,  hospitalized at [facility] from 02/22/13 to 02/24/13 for severe anemia, transfusion x 3 units PRBCs. GI w/u with endoscopy and colonoscopy. EGD was negative;   colonoscopy showed rectal tear s/p clips and resolution of problem Depression Pacer, data goes to Dr. Graciela Husbands She presents for routine follow-up of her sick sinus syndrome, hypertension  Lives at [facility] Eating ok Uses a walker, or a cane No walker in appt No falls Meals delivered, has friends, children call  Monitors BP: 138/79,  Was high Pulse 70s resp 16  Sleeps ok, comes and goes  Total chol 166, LDL 89, in 2019  CR 0.78   For lung cancer, Had chemo and XRT CT scan 07/2018 No definite evidence to suggest metastatic disease within the chest.  Other past medical hx reviewed Kidney surgery for renal cell carcinoma March 2017, difficult recovery Feel she had a reaction to pain medication, morphine  CT scan of abdomen reviewed with her in detail, images pulled up She has moderate aortic atherosclerosis particularly at the distal aorta and iliac bifurcation Moderate proximal LAD calcification noted Currently not on a statin, reports she had a problem on Lipitor, muscle ache  Previously in the hospital March 09 2013 for chest pain.   CT scan of the chest for elevated d-dimer and pleuritic chest pain. No PE noted   may 2014, admission for cellulitis requiring long course of  antibiotic   Prior CV studies:   The following studies were reviewed today:    Past Medical History:  Diagnosis Date  . Anemia 02/2013   F/U with Dr. Orlie Dakin for Feraheme injections  . Anxiety   . Arthritis    Possible RA - Follow up appt with Dr. Gavin Potters  . Cellulitis of arm, right   . Diabetes insipidus (HCC)     On desmopressin  . GERD (gastroesophageal reflux disease)   . H/O seasonal allergies   . H/O transfusion of packed red blood cells 02/2013   3 units PRBC for severe anemia 02/2013  . Herniated disc   . History of kidney stones   . Hyperlipidemia   . Hypertension   . Hyperthyroidism    s/p radiation therapy, now hypothyroidism  . IBS (irritable bowel syndrome)   . Lung cancer (HCC) 06/2017   left  . Migraine   . Pacemaker    a. s/p successful implantation of a Medtronic CapSureFix Novus MRIT SureScan serial # [id number] on 06/28/16 by Dr. Graciela Husbands for sinus node dysfunction.  . Pancreatic cyst   . Paroxysmal A-fib (HCC)   . Renal cell cancer, right (HCC) 10/23/2015   Right partial nephrectomy.   Past Surgical History:  Procedure Laterality Date  . ABDOMINAL HYSTERECTOMY    . BREAST EXCISIONAL BIOPSY Right 2005   neg  . BREAST SURGERY  1990s   Biopsy  . CERVICAL SPINE SURGERY     Bone fusion  . COLON[place]  2008?   [place]  . COLONOSCOPY WITH PROPOFOL N/A 10/15/2016   Procedure: COLONOSCOPY WITH PROPOFOL;  Surgeon: Earline Mayotte, MD;  Location: [facility] ENDOSCOPY;  Service: Endoscopy;  Laterality: N/A;  . EP IMPLANTABLE DEVICE N/A 06/28/2016   Procedure: Pacemaker Implant;  Surgeon: Duke Salvia, MD;  Location: [facility] INVASIVE CV LAB;  Service: Cardiovascular;  Laterality: N/A;  . HAMMER TOE SURGERY    . HERNIA REPAIR Left    Inguinal Hernia Repair  . PORTA CATH INSERTION N/A 07/24/2017   Procedure: PORTA CATH INSERTION;  Surgeon: Annice Needy, MD;  Location: [facility] INVASIVE CV LAB;  Service: Cardiovascular;  Laterality: N/A;  . ROBOTIC ASSITED PARTIAL NEPHRECTOMY Right 10/23/2015   Procedure: ROBOTIC ASSITED PARTIAL NEPHRECTOMY with intraop ultrasound;  Surgeon: Vanna Scotland, MD;  Location: [facility] ORS;  Service: Urology;  Laterality: Right;  . TUBAL LIGATION       Current Meds  Medication Sig  . acetaminophen (TYLENOL) 325 MG tablet Take 2 tablets (650 mg total) by mouth  every 6 (six) hours as needed for pain. (Patient taking differently: Take 650 mg by mouth every 6 (six) hours as needed for pain. )  . albuterol (PROVENTIL HFA;VENTOLIN HFA) 108 (90 Base) MCG/ACT inhaler Inhale 2 puffs into the lungs every 6 (six) hours as needed for wheezing or shortness of breath.  . ALPRAZolam (XANAX) 0.25 MG tablet TAKE ONE TABLET BY MOUTH TWICE A DAY AS NEEDED FOR ANXIETY  . brimonidine (ALPHAGAN) 0.2 % ophthalmic solution Place 1 drop into both eyes 2 (two) times daily.  . Calcium Carbonate-Vitamin D (CALCIUM-CARB 600 + D) 600-125 MG-UNIT TABS Take 2 tablets by mouth daily.  . carvedilol (COREG) 6.25 MG tablet TAKE ONE TABLET BY MOUTH TWICE A DAY  . cholecalciferol (VITAMIN D) 1000 units tablet Take 1,000 Units by mouth daily.  . dorzolamide (TRUSOPT) 2 % ophthalmic solution Place 1 drop into both eyes 2 (two) times daily.   . ferrous sulfate 325 (65 FE) MG  tablet Take 325 mg by mouth daily with breakfast.  . furosemide (LASIX) 20 MG tablet Take 0.5 tablets (10 mg total) by mouth daily.  Marland Kitchen gabapentin (NEURONTIN) 300 MG capsule TAKE ONE CAPSULE BY MOUTH THREE TIMES A DAY AS NEEDED FOR SHINGLES PAIN  . hydroxychloroquine (PLAQUENIL) 200 MG tablet Take 400 mg by mouth daily.   Marland Kitchen latanoprost (XALATAN) 0.005 % ophthalmic solution Place 1 drop into both eyes at bedtime.   Marland Kitchen levothyroxine (SYNTHROID, LEVOTHROID) 112 MCG tablet TAKE ONE TABLET BY MOUTH EVERY MORNING  . losartan (COZAAR) 50 MG tablet TAKE ONE TABLET BY MOUTH DAILY  . mirabegron ER (MYRBETRIQ) 50 MG TB24 tablet Take 1 tablet (50 mg total) by mouth daily.  . mirtazapine (REMERON) 15 MG tablet TAKE ONE TABLET BY MOUTH EVERY NIGHT AT BEDTIME  . nitroGLYCERIN (NITROLINGUAL) 0.4 MG/SPRAY spray Place 1 spray under the tongue every 5 (five) minutes x 3 doses as needed for chest pain.  . Omega-3 Fatty Acids (FISH OIL) 1000 MG CAPS Take 1,000 mg by mouth daily.   . pantoprazole (PROTONIX) 40 MG tablet TAKE ONE TABLET BY MOUTH  DAILY  . PARoxetine (PAXIL) 10 MG tablet Take 1 tablet (10 mg total) by mouth daily. After dinner  . timolol (TIMOPTIC) 0.5 % ophthalmic solution Place 1 drop into both eyes 2 (two) times daily.   . vitamin C (ASCORBIC ACID) 500 MG tablet Take 500 mg by mouth daily.     Allergies:   Metrizamide; Adhesive [tape]; Augmentin [amoxicillin-pot clavulanate]; Codeine; Morphine and related; Other; Tetanus toxoid; and Tramadol   Social History   Tobacco Use  . Smoking status: Former Smoker    Packs/day: 0.50    Years: 30.00    Pack years: 15.00    Types: Cigarettes    Last attempt to quit: 12/03/2012    Years since quitting: 6.0  . Smokeless tobacco: Never Used  Substance Use Topics  . Alcohol use: No    Alcohol/week: 0.0 standard drinks  . Drug use: No     Current Outpatient Medications on File Prior to Visit  Medication Sig Dispense Refill  . acetaminophen (TYLENOL) 325 MG tablet Take 2 tablets (650 mg total) by mouth every 6 (six) hours as needed for pain. (Patient taking differently: Take 650 mg by mouth every 6 (six) hours as needed for pain. )    . albuterol (PROVENTIL HFA;VENTOLIN HFA) 108 (90 Base) MCG/ACT inhaler Inhale 2 puffs into the lungs every 6 (six) hours as needed for wheezing or shortness of breath. 1 Inhaler 2  . ALPRAZolam (XANAX) 0.25 MG tablet TAKE ONE TABLET BY MOUTH TWICE A DAY AS NEEDED FOR ANXIETY 60 tablet 0  . brimonidine (ALPHAGAN) 0.2 % ophthalmic solution Place 1 drop into both eyes 2 (two) times daily.    . Calcium Carbonate-Vitamin D (CALCIUM-CARB 600 + D) 600-125 MG-UNIT TABS Take 2 tablets by mouth daily.    . carvedilol (COREG) 6.25 MG tablet TAKE ONE TABLET BY MOUTH TWICE A DAY 180 tablet 1  . cholecalciferol (VITAMIN D) 1000 units tablet Take 1,000 Units by mouth daily.    . dorzolamide (TRUSOPT) 2 % ophthalmic solution Place 1 drop into both eyes 2 (two) times daily.     . ferrous sulfate 325 (65 FE) MG tablet Take 325 mg by mouth daily with breakfast.     . furosemide (LASIX) 20 MG tablet Take 0.5 tablets (10 mg total) by mouth daily. 45 tablet 4  . gabapentin (NEURONTIN) 300 MG capsule  TAKE ONE CAPSULE BY MOUTH THREE TIMES A DAY AS NEEDED FOR SHINGLES PAIN 90 capsule 0  . hydroxychloroquine (PLAQUENIL) 200 MG tablet Take 400 mg by mouth daily.     Marland Kitchen latanoprost (XALATAN) 0.005 % ophthalmic solution Place 1 drop into both eyes at bedtime.     Marland Kitchen levothyroxine (SYNTHROID, LEVOTHROID) 112 MCG tablet TAKE ONE TABLET BY MOUTH EVERY MORNING 90 tablet 0  . losartan (COZAAR) 50 MG tablet TAKE ONE TABLET BY MOUTH DAILY 90 tablet 1  . mirabegron ER (MYRBETRIQ) 50 MG TB24 tablet Take 1 tablet (50 mg total) by mouth daily. 30 tablet 11  . mirtazapine (REMERON) 15 MG tablet TAKE ONE TABLET BY MOUTH EVERY NIGHT AT BEDTIME 30 tablet 1  . nitroGLYCERIN (NITROLINGUAL) 0.4 MG/SPRAY spray Place 1 spray under the tongue every 5 (five) minutes x 3 doses as needed for chest pain. 4.9 g 0  . Omega-3 Fatty Acids (FISH OIL) 1000 MG CAPS Take 1,000 mg by mouth daily.     . pantoprazole (PROTONIX) 40 MG tablet TAKE ONE TABLET BY MOUTH DAILY 30 tablet 4  . PARoxetine (PAXIL) 10 MG tablet Take 1 tablet (10 mg total) by mouth daily. After dinner 30 tablet 5  . timolol (TIMOPTIC) 0.5 % ophthalmic solution Place 1 drop into both eyes 2 (two) times daily.     . vitamin C (ASCORBIC ACID) 500 MG tablet Take 500 mg by mouth daily.     No current facility-administered medications on file prior to visit.      Family Hx: The patient's family history includes Breast cancer in her cousin; Diabetes in her brother; Heart disease in her father and mother; Kidney disease in her brother; Stroke in her daughter. There is no history of Prostate cancer or Hematuria.  ROS:   Please see the history of present illness.    Review of Systems  Constitutional: Negative.   Respiratory: Negative.   Cardiovascular: Negative.   Gastrointestinal: Negative.   Musculoskeletal: Positive for joint  pain.       Gait instability  Neurological: Negative.   Psychiatric/Behavioral: Negative.   All other systems reviewed and are negative.    Labs/Other Tests and Data Reviewed:    Recent Labs: 12/19/2017: Magnesium 1.8 08/21/2018: ALT 18; BUN 13; Creatinine, Ser 0.81; Hemoglobin 12.2; Platelets 95.0; Potassium 3.9; Sodium 140; TSH 3.02   Recent Lipid Panel Lab Results  Component Value Date/Time   CHOL 166 11/26/2017 10:17 AM   TRIG 78 11/26/2017 10:17 AM   HDL 60 11/26/2017 10:17 AM   CHOLHDL 2.8 11/26/2017 10:17 AM   LDLCALC 89 11/26/2017 10:17 AM    Wt Readings from Last 3 Encounters:  12/11/18 133 lb (60.3 kg)  09/18/18 135 lb 12.8 oz (61.6 kg)  08/21/18 141 lb (64 kg)     Exam:    Vital Signs: Vital signs may also be detailed in the HPI BP (!) 172/83   Pulse 73   Ht 5\' 2"  (1.575 m)   Wt 133 lb (60.3 kg)   BMI 24.33 kg/m   Wt Readings from Last 3 Encounters:  12/11/18 133 lb (60.3 kg)  09/18/18 135 lb 12.8 oz (61.6 kg)  08/21/18 141 lb (64 kg)   Temp Readings from Last 3 Encounters:  09/18/18 98.3 F (36.8 C) (Oral)  08/21/18 98.2 F (36.8 C) (Oral)  07/21/18 98.3 F (36.8 C) (Oral)   BP Readings from Last 3 Encounters:  12/11/18 (!) 172/83  09/18/18 (!) 150/84  08/21/18 126/74  Pulse Readings from Last 3 Encounters:  12/11/18 73  09/18/18 69  08/21/18 71    138/79,  Pulse 70s resp 16  Well nourished, well developed female in no acute distress. Constitutional:  oriented to person, place, and time. No distress.    ASSESSMENT & PLAN:    Atherosclerotic peripheral vascular disease with intermittent claudication (HCC) Currently with no symptoms of angina. No further workup at this time. Continue current medication regimen.  SSS,  has a pacemaker Followed by EP She is not reported any complications  Chest pain, unspecified type Denies having significant chest pain concerning for angina She would like a refill of her nitro spray  Essential  hypertension Initial blood pressure was 170 systolic but she was running around doing laundry After she sat back down and rested systolic pressure down into the 130 range We will continue current medications, she does not want any additional pills  Fatigue, unspecified type In general active, has good support from friends and family Seems to be in good spirits Recommend she continue regular walking with her walker for longer distances  Hyperlipidemia, unspecified hyperlipidemia type Previous tried Lipitor, Crestor, does not want to retry statin Does not want Zetia, does not want any more pills in general  Pacemaker Followed by Dr. Graciela Husbands Sinus node dysfunction [facility])   COVID-19 Education: The signs and symptoms of COVID-19 were discussed with the patient and how to seek care for testing (follow up with PCP or arrange E-visit).  The importance of social distancing was discussed today.  Patient Risk:   After full review of this patients clinical status, I feel that they are at least moderate risk at this time.  Time:   Today, I have spent 25 minutes with the patient with telehealth technology discussing the cardiac and medical problems/diagnoses detailed above   10 min spent reviewing the chart prior to patient visit today   Medication Adjustments/Labs and Tests Ordered: Current medicines are reviewed at length with the patient today.  Concerns regarding medicines are outlined above.   Tests Ordered: No tests ordered   Medication Changes: No changes made   Disposition: Follow-up in 12 months   Signed, Julien Nordmann, MD  12/11/2018 12:24 PM    [facility] [address], [place], [place] [address]

## 2018-12-11 NOTE — Patient Instructions (Addendum)
Medication Instructions:  Your physician recommends that you continue on your current medications as directed. Please refer to the Current Medication list given to you today.  - Nitroglycerin spray was refilled to the [facility].   If you need a refill on your cardiac medications before your next appointment, please call your pharmacy.    Lab work: No new labs needed  If you have labs (blood work) drawn today and your tests are completely normal, you will receive your results only by: Marland Kitchen MyChart Message (if you have MyChart) OR . A paper copy in the mail If you have any lab test that is abnormal or we need to change your treatment, we will call you to review the results.   Testing/Procedures: No new testing needed    Follow-Up: At [facility], you and your health needs are our priority.  As part of our continuing mission to provide you with exceptional heart care, we have created designated Provider Care Teams.  These Care Teams include your primary Cardiologist (physician) and Advanced Practice Providers (APPs -  Physician Assistants and Nurse Practitioners) who all work together to provide you with the care you need, when you need it.  . You will need a follow up appointment in 12 months (May 2021) .   Please call our office 2 months in advance to schedule this appointment.  (call the office in early March 2021 to schedule).  . Providers on your designated Care Team:   . Murray Hodgkins, NP . Christell Faith, PA-C . Marrianne Mood, PA-C  Any Other Special Instructions Will Be Listed Below (If Applicable).  For educational health videos Log in to : [email or web address] Or : [email or web address], password : [email or web address]

## 2018-12-14 ENCOUNTER — Other Ambulatory Visit: Payer: Self-pay | Admitting: Internal Medicine

## 2018-12-15 ENCOUNTER — Ambulatory Visit: Payer: Medicare Other | Admitting: Urology

## 2018-12-17 ENCOUNTER — Ambulatory Visit (INDEPENDENT_AMBULATORY_CARE_PROVIDER_SITE_OTHER): Payer: Medicare Other

## 2018-12-17 ENCOUNTER — Other Ambulatory Visit: Payer: Self-pay

## 2018-12-17 ENCOUNTER — Ambulatory Visit (INDEPENDENT_AMBULATORY_CARE_PROVIDER_SITE_OTHER): Payer: Medicare Other | Admitting: *Deleted

## 2018-12-17 DIAGNOSIS — I495 Sick sinus syndrome: Secondary | ICD-10-CM | POA: Diagnosis not present

## 2018-12-17 DIAGNOSIS — E538 Deficiency of other specified B group vitamins: Secondary | ICD-10-CM

## 2018-12-17 LAB — CUP PACEART REMOTE DEVICE CHECK
Battery Remaining Longevity: 99 mo
Battery Voltage: 3.02 V
Brady Statistic AP VP Percent: 0.02 %
Brady Statistic AP VS Percent: 9.08 %
Brady Statistic AS VP Percent: 0.05 %
Brady Statistic AS VS Percent: 90.85 %
Brady Statistic RA Percent Paced: 9.09 %
Brady Statistic RV Percent Paced: 0.07 %
Date Time Interrogation Session: 20200514145014
Implantable Lead Implant Date: 20171124
Implantable Lead Implant Date: 20171124
Implantable Lead Location: 753859
Implantable Lead Location: 753860
Implantable Lead Model: 5076
Implantable Lead Model: 5076
Implantable Pulse Generator Implant Date: 20171124
Lead Channel Impedance Value: 323 Ohm
Lead Channel Impedance Value: 361 Ohm
Lead Channel Impedance Value: 399 Ohm
Lead Channel Impedance Value: 475 Ohm
Lead Channel Pacing Threshold Amplitude: 0.75 V
Lead Channel Pacing Threshold Amplitude: 1 V
Lead Channel Pacing Threshold Pulse Width: 0.4 ms
Lead Channel Pacing Threshold Pulse Width: 0.4 ms
Lead Channel Sensing Intrinsic Amplitude: 1.25 mV
Lead Channel Sensing Intrinsic Amplitude: 1.25 mV
Lead Channel Sensing Intrinsic Amplitude: 15.5 mV
Lead Channel Sensing Intrinsic Amplitude: 15.5 mV
Lead Channel Setting Pacing Amplitude: 2 V
Lead Channel Setting Pacing Amplitude: 2.5 V
Lead Channel Setting Pacing Pulse Width: 0.4 ms
Lead Channel Setting Sensing Sensitivity: 0.9 mV

## 2018-12-17 MED ORDER — CYANOCOBALAMIN 1000 MCG/ML IJ SOLN
1000.0000 ug | Freq: Once | INTRAMUSCULAR | Status: AC
  Administered 2018-12-17: 1000 ug via INTRAMUSCULAR

## 2018-12-17 NOTE — Progress Notes (Signed)
[name] presents today for injection per MD orders. B12 injection  administered IM in left Upper Arm. Administration without incident. Patient tolerated well.  Nina,cma

## 2018-12-18 ENCOUNTER — Ambulatory Visit (INDEPENDENT_AMBULATORY_CARE_PROVIDER_SITE_OTHER): Payer: Medicare Other | Admitting: Internal Medicine

## 2018-12-18 DIAGNOSIS — Z7189 Other specified counseling: Secondary | ICD-10-CM

## 2018-12-18 DIAGNOSIS — M1990 Unspecified osteoarthritis, unspecified site: Secondary | ICD-10-CM

## 2018-12-18 DIAGNOSIS — I70219 Atherosclerosis of native arteries of extremities with intermittent claudication, unspecified extremity: Secondary | ICD-10-CM

## 2018-12-18 DIAGNOSIS — F322 Major depressive disorder, single episode, severe without psychotic features: Secondary | ICD-10-CM | POA: Diagnosis not present

## 2018-12-18 DIAGNOSIS — I10 Essential (primary) hypertension: Secondary | ICD-10-CM

## 2018-12-18 NOTE — Progress Notes (Signed)
Telephone  Note  This visit type was conducted due to national recommendations for restrictions regarding the COVID-19 pandemic (e.g. social distancing).  This format is felt to be most appropriate for this patient at this time.  All issues noted in this document were discussed and addressed.  No physical exam was performed (except for noted visual exam findings with Video Visits).   I connected with@ on 12/18/18 at  9:30 AM EDT by  telephone and verified that I am speaking with the correct person using two identifiers. Location patient: home Location provider: home office Persons participating in the virtual visit: patient, provider  I discussed the limitations, risks, security and privacy concerns of performing an evaluation and management service by telephone and the availability of in person appointments. I also discussed with the patient that there may be a patient responsible charge related to this service. The patient expressed understanding and agreed to proceed.  Reason for visit: follow up on depression , anxiety and hypertension   HPI:   The patient has no signs or symptoms of COVID 19 infection (fever, cough, sore throat  or shortness of breath beyond what is typical for patient).  Patient denies contact with other persons with the above mentioned symptoms or with anyone confirmed to have COVID 19 .  She states that she is Feeling good except for joint pain which is chronic. She has been staying at home  Meals delivered from dining hall and family supplementing.    She denies any symptoms of persistent depression and is tolerating Paxil without the persistent diarrhea.   She  has not had  any significant weight loss . She is sleeping well, has a good appetite with use of remeron, despite a lack of social outings once a week.  She has taken a healthy interest in her appearance  and her demeanor is calm.   Hypertension: patient checks blood pressure twice weekly at home.  Readings  have been for the most part < 140/80 at rest . Patient is following a reduce salt diet most days and is taking medications as prescribed  She is taking plaquenil for joint pain Jefm Bryant).  Has CT in July for lung cancer surveillance    ROS: Patient denies headache, fevers, malaise, unintentional weight loss, skin rash, eye pain, sinus congestion and sinus pain, sore throat, dysphagia,  hemoptysis , cough, dyspnea, wheezing, chest pain, palpitations, orthopnea, edema, abdominal pain, nausea, melena, diarrhea, constipation, flank pain, dysuria, hematuria, urinary  Frequency, nocturia, numbness, tingling, seizures,  Focal weakness, Loss of consciousness,  Tremor, insomnia, untreated depression, anxiety, and suicidal ideation.      Past Medical History:  Diagnosis Date   Anemia 02/2013   F/U with Dr. Grayland Ormond for Feraheme injections   Anxiety    Arthritis    Possible RA - Follow up appt with Dr. Jefm Bryant   Cellulitis of arm, right    Diabetes insipidus ([place])    On desmopressin   GERD (gastroesophageal reflux disease)    H/O seasonal allergies    H/O transfusion of packed red blood cells 02/2013   3 units PRBC for severe anemia 02/2013   Herniated disc    History of kidney stones    Hyperlipidemia    Hypertension    Hyperthyroidism    s/p radiation therapy, now hypothyroidism   IBS (irritable bowel syndrome)    Lung cancer ([place]) 06/2017   left   Migraine    Pacemaker    a. s/p successful implantation of a  Medtronic CapSureFix Novus MRI SureScan serial # [id number] on 06/28/16 by Dr. Caryl Comes for sinus node dysfunction.   Pancreatic cyst    Paroxysmal A-fib [facility])    Renal cell cancer, right ([place]) 10/23/2015   Right partial nephrectomy.    Past Surgical History:  Procedure Laterality Date   ABDOMINAL HYSTERECTOMY     BREAST EXCISIONAL BIOPSY Right 2005   neg   BREAST SURGERY  1990s   Biopsy   CERVICAL SPINE SURGERY     Bone fusion   COLONOSCOPY   2008?   [place]   COLONOSCOPY WITH PROPOFOL N/A 10/15/2016   Procedure: COLONOSCOPY WITH PROPOFOL;  Surgeon: Robert Bellow, MD;  Location: [facility] ENDOSCOPY;  Service: Endoscopy;  Laterality: N/A;   EP IMPLANTABLE DEVICE N/A 06/28/2016   Procedure: Pacemaker Implant;  Surgeon: Deboraha Sprang, MD;  Location: [place] CV LAB;  Service: Cardiovascular;  Laterality: N/A;   HAMMER TOE SURGERY     HERNIA REPAIR Left    Inguinal Hernia Repair   PORTA CATH INSERTION N/A 07/24/2017   Procedure: PORTA CATH INSERTION;  Surgeon: Algernon Huxley, MD;  Location: [place] [facility];  Service: Cardiovascular;  Laterality: N/A;   ROBOTIC ASSITED PARTIAL NEPHRECTOMY Right 10/23/2015   Procedure: ROBOTIC ASSITED PARTIAL NEPHRECTOMY with intraop ultrasound;  Surgeon: Hollice Espy, MD;  Location: [facility] ORS;  Service: Urology;  Laterality: Right;   TUBAL LIGATION      Family History  Problem Relation Age of Onset   Heart disease Mother    Heart disease Father    Diabetes Brother    Kidney disease Brother    Stroke Daughter        due to medication reaction   Breast cancer Cousin    Prostate cancer Neg Hx    Hematuria Neg Hx     SOCIAL HX:  Social History   Social History Narrative   Ms. Toft was born and reared in [place]. She is a widow since 39. She was married for 48 years. They had 4 children (daughters). She is currently leaving at [facility] since June. 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 She enjoys shopping. She also enjoys music, word puzzles and she loves spending time with her family. She loves attending church. One of her favorite things is wearing hats. She absolutely loves hats!     Current Outpatient Medications:    acetaminophen (TYLENOL) 325 MG tablet, Take 2 tablets (650 mg total) by mouth every 6 (six) hours as needed for pain. (Patient taking differently: Take  650 mg by mouth every 6 (six) hours as needed for pain. ), Disp: , Rfl:    albuterol (PROVENTIL HFA;VENTOLIN HFA) 108 (90 Base) MCG/ACT inhaler, Inhale 2 puffs into the lungs every 6 (six) hours as needed for wheezing or shortness of breath., Disp: 1 Inhaler, Rfl: 2   ALPRAZolam (XANAX) 0.25 MG tablet, TAKE ONE TABLET BY MOUTH TWICE A DAY AS NEEDED FOR ANXIETY, Disp: 60 tablet, Rfl: 0   brimonidine (ALPHAGAN) 0.2 % ophthalmic solution, Place 1 drop into both eyes 2 (two) times daily., Disp: , Rfl:    Calcium Carbonate-Vitamin D (CALCIUM-CARB 600 + D) 600-125 MG-UNIT TABS, Take 2 tablets by mouth daily., Disp: , Rfl:    carvedilol (COREG) 6.25 MG tablet, TAKE ONE TABLET BY MOUTH TWICE A DAY, Disp: 180 tablet, Rfl: 1   cholecalciferol (VITAMIN D) 1000 units tablet, Take 1,000  Units by mouth daily., Disp: , Rfl:    dorzolamide (TRUSOPT) 2 % ophthalmic solution, Place 1 drop into both eyes 2 (two) times daily. , Disp: , Rfl:    ferrous sulfate 325 (65 FE) MG tablet, Take 325 mg by mouth daily with breakfast., Disp: , Rfl:    furosemide (LASIX) 20 MG tablet, Take 0.5 tablets (10 mg total) by mouth daily., Disp: 45 tablet, Rfl: 4   gabapentin (NEURONTIN) 300 MG capsule, TAKE ONE CAPSULE BY MOUTH THREE TIMES A DAY AS NEEDED FOR SHINGLES PAIN, Disp: 90 capsule, Rfl: 0   hydroxychloroquine (PLAQUENIL) 200 MG tablet, Take 400 mg by mouth daily. , Disp: , Rfl:    latanoprost (XALATAN) 0.005 % ophthalmic solution, Place 1 drop into both eyes at bedtime. , Disp: , Rfl:    levothyroxine (SYNTHROID) 112 MCG tablet, TAKE ONE TABLET BY MOUTH EVERY MORNING, Disp: 90 tablet, Rfl: 1   losartan (COZAAR) 50 MG tablet, TAKE ONE TABLET BY MOUTH DAILY, Disp: 90 tablet, Rfl: 1   mirabegron ER (MYRBETRIQ) 50 MG TB24 tablet, Take 1 tablet (50 mg total) by mouth daily., Disp: 30 tablet, Rfl: 11   mirtazapine (REMERON) 15 MG tablet, TAKE ONE TABLET BY MOUTH EVERY NIGHT AT BEDTIME, Disp: 30 tablet, Rfl: 1    nitroGLYCERIN (NITROLINGUAL) 0.4 MG/SPRAY spray, Place 1 spray under the tongue every 5 (five) minutes x 3 doses as needed for chest pain., Disp: 4.9 g, Rfl: 3   Omega-3 Fatty Acids (FISH OIL) 1000 MG CAPS, Take 1,000 mg by mouth daily. , Disp: , Rfl:    pantoprazole (PROTONIX) 40 MG tablet, TAKE ONE TABLET BY MOUTH DAILY, Disp: 30 tablet, Rfl: 4   PARoxetine (PAXIL) 10 MG tablet, Take 1 tablet (10 mg total) by mouth daily. After dinner, Disp: 30 tablet, Rfl: 5   timolol (TIMOPTIC) 0.5 % ophthalmic solution, Place 1 drop into both eyes 2 (two) times daily. , Disp: , Rfl:    vitamin C (ASCORBIC ACID) 500 MG tablet, Take 500 mg by mouth daily., Disp: , Rfl:   EXAM   General impression: alert, cooperative and articulate.  No signs of being in distress  Lungs: speech is fluent sentence length suggests that patient is not short of breath and not punctuated by cough, sneezing or sniffing. Marland Kitchen   Psych: affect normal.  speech is articulate and non pressured .  Denies suicidal thoughts  ASSESSMENT AND PLAN:  Discussed the following assessment and plan:  Current severe episode of major depressive disorder without psychotic features, unspecified whether recurrent ([place])  Educated About Covid-19 Virus Infection  Inflammatory arthritis  Major depressive disorder with current active episode With insomnia.  Continue  Paxil, continue remeron. reminded to use prn  alprazolam  For nocturnal wakeups.     Educated About Covid-19 Virus Infection Educated patient on the signs and symptoms of COVID-19 infection and ways to avoid the viral infection including washing hands frequently with soap and water,  using hand sanitizer if unable to wash, avoiding touching face,  staying at home and limiting visitors,  and avoiding contact with people coming in and out of home.  Reminded patient to call office with questions/concerns.  The importance of social distancing was discussed today  Inflammatory  arthritis Managed with Placquenil by Rheumatology.  Recent dose reduction not tolerated due to increased pain     I discussed the assessment and treatment plan with the patient. The patient was provided an opportunity to ask questions and all were answered.  The patient agreed with the plan and demonstrated an understanding of the instructions.   The patient was advised to call back or seek an in-person evaluation if the symptoms worsen or if the condition fails to improve as anticipated.  I provided 25  minutes of non-face-to-face time during this encounter.   [name], MD

## 2018-12-20 DIAGNOSIS — Z7189 Other specified counseling: Secondary | ICD-10-CM | POA: Insufficient documentation

## 2018-12-20 NOTE — Assessment & Plan Note (Signed)
Managed with Placquenil by Rheumatology.  Recent dose reduction not tolerated due to increased pain

## 2018-12-20 NOTE — Assessment & Plan Note (Signed)
Educated patient on the signs and symptoms of COVID-19 infection and ways to avoid the viral infection including washing hands frequently with soap and water,  using hand sanitizer if unable to wash, avoiding touching face,  staying at home and limiting visitors,  and avoiding contact with people coming in and out of home.  Reminded patient to call office with questions/concerns.  The importance of social distancing was discussed today 

## 2018-12-20 NOTE — Assessment & Plan Note (Signed)
With insomnia.  Continue  Paxil, continue remeron. reminded to use prn  alprazolam  For nocturnal wakeups.

## 2018-12-20 NOTE — Assessment & Plan Note (Signed)
Well controlled on current regimen. Renal function stable, no changes today. 

## 2018-12-24 ENCOUNTER — Encounter: Payer: Self-pay | Admitting: Cardiology

## 2018-12-24 DIAGNOSIS — H401133 Primary open-angle glaucoma, bilateral, severe stage: Secondary | ICD-10-CM | POA: Diagnosis not present

## 2018-12-24 NOTE — Progress Notes (Signed)
Remote pacemaker transmission.   

## 2018-12-29 ENCOUNTER — Other Ambulatory Visit: Payer: Self-pay | Admitting: Oncology

## 2018-12-29 ENCOUNTER — Other Ambulatory Visit: Payer: Self-pay | Admitting: Internal Medicine

## 2018-12-30 NOTE — Telephone Encounter (Signed)
Refilled: 11/04/2018 Last OV: 12/18/2018 Next OV: not scheduled

## 2019-01-11 ENCOUNTER — Other Ambulatory Visit: Payer: Self-pay | Admitting: *Deleted

## 2019-01-11 DIAGNOSIS — C7A8 Other malignant neuroendocrine tumors: Secondary | ICD-10-CM

## 2019-01-11 NOTE — Progress Notes (Signed)
cr

## 2019-01-12 ENCOUNTER — Inpatient Hospital Stay: Payer: Medicare Other | Attending: Oncology

## 2019-01-12 ENCOUNTER — Other Ambulatory Visit: Payer: Self-pay

## 2019-01-12 DIAGNOSIS — I480 Paroxysmal atrial fibrillation: Secondary | ICD-10-CM | POA: Insufficient documentation

## 2019-01-12 DIAGNOSIS — Z85528 Personal history of other malignant neoplasm of kidney: Secondary | ICD-10-CM | POA: Diagnosis not present

## 2019-01-12 DIAGNOSIS — E785 Hyperlipidemia, unspecified: Secondary | ICD-10-CM | POA: Insufficient documentation

## 2019-01-12 DIAGNOSIS — C7A8 Other malignant neuroendocrine tumors: Secondary | ICD-10-CM

## 2019-01-12 DIAGNOSIS — Z90710 Acquired absence of both cervix and uterus: Secondary | ICD-10-CM | POA: Insufficient documentation

## 2019-01-12 DIAGNOSIS — E039 Hypothyroidism, unspecified: Secondary | ICD-10-CM | POA: Diagnosis not present

## 2019-01-12 DIAGNOSIS — I10 Essential (primary) hypertension: Secondary | ICD-10-CM | POA: Insufficient documentation

## 2019-01-12 DIAGNOSIS — Z87891 Personal history of nicotine dependence: Secondary | ICD-10-CM | POA: Diagnosis not present

## 2019-01-12 DIAGNOSIS — Z905 Acquired absence of kidney: Secondary | ICD-10-CM | POA: Insufficient documentation

## 2019-01-12 DIAGNOSIS — M069 Rheumatoid arthritis, unspecified: Secondary | ICD-10-CM | POA: Diagnosis not present

## 2019-01-12 DIAGNOSIS — D61818 Other pancytopenia: Secondary | ICD-10-CM | POA: Diagnosis not present

## 2019-01-12 DIAGNOSIS — R232 Flushing: Secondary | ICD-10-CM | POA: Insufficient documentation

## 2019-01-12 DIAGNOSIS — Z8042 Family history of malignant neoplasm of prostate: Secondary | ICD-10-CM | POA: Diagnosis not present

## 2019-01-12 DIAGNOSIS — Z803 Family history of malignant neoplasm of breast: Secondary | ICD-10-CM | POA: Diagnosis not present

## 2019-01-12 DIAGNOSIS — Z95828 Presence of other vascular implants and grafts: Secondary | ICD-10-CM

## 2019-01-12 DIAGNOSIS — Z79899 Other long term (current) drug therapy: Secondary | ICD-10-CM | POA: Diagnosis not present

## 2019-01-12 LAB — CREATININE, SERUM
Creatinine, Ser: 0.88 mg/dL (ref 0.44–1.00)
GFR calc Af Amer: >60 mL/min (ref >60)
GFR calc non Af Amer: >60 mL/min (ref >60)

## 2019-01-12 MED ORDER — HEPARIN SOD (PORK) LOCK FLUSH 100 UNIT/ML IV SOLN
500.0000 [IU] | Freq: Once | INTRAVENOUS | Status: AC
  Administered 2019-01-12: 500 [IU] via INTRAVENOUS

## 2019-01-12 MED ORDER — SODIUM CHLORIDE 0.9% FLUSH
10.0000 mL | Freq: Once | INTRAVENOUS | Status: AC
  Administered 2019-01-12: 10 mL via INTRAVENOUS
  Filled 2019-01-12: qty 10

## 2019-01-13 ENCOUNTER — Ambulatory Visit
Admission: RE | Admit: 2019-01-13 | Discharge: 2019-01-13 | Disposition: A | Discharge status: Home / Self Care | Payer: Medicare Other | Source: Ambulatory Visit | Attending: Oncology | Admitting: Oncology

## 2019-01-13 ENCOUNTER — Other Ambulatory Visit: Payer: Medicare Other

## 2019-01-13 DIAGNOSIS — C7A8 Other malignant neuroendocrine tumors: Secondary | ICD-10-CM | POA: Diagnosis not present

## 2019-01-13 DIAGNOSIS — N200 Calculus of kidney: Secondary | ICD-10-CM | POA: Diagnosis not present

## 2019-01-13 DIAGNOSIS — C3490 Malignant neoplasm of unspecified part of unspecified bronchus or lung: Secondary | ICD-10-CM | POA: Diagnosis not present

## 2019-01-13 DIAGNOSIS — Z85528 Personal history of other malignant neoplasm of kidney: Secondary | ICD-10-CM | POA: Insufficient documentation

## 2019-01-13 MED ORDER — IOHEXOL 300 MG/ML  SOLN
100.0000 mL | Freq: Once | INTRAMUSCULAR | Status: AC | PRN
  Administered 2019-01-13: 80 mL via INTRAVENOUS

## 2019-01-14 ENCOUNTER — Telehealth: Payer: Self-pay

## 2019-01-14 NOTE — Telephone Encounter (Signed)
Not sure who called pt do not see any documentation in the chart.

## 2019-01-14 NOTE — Telephone Encounter (Signed)
Copied from [place] [phone]. Topic: General - Call Back - No Documentation >> Jan 14, 2019 11:38 AM Marin Olp L wrote: Reason for CRM: Missed call.

## 2019-01-18 ENCOUNTER — Ambulatory Visit: Payer: Medicare Other | Admitting: Oncology

## 2019-01-19 ENCOUNTER — Ambulatory Visit (INDEPENDENT_AMBULATORY_CARE_PROVIDER_SITE_OTHER): Payer: Medicare Other

## 2019-01-19 ENCOUNTER — Other Ambulatory Visit: Payer: Self-pay

## 2019-01-19 DIAGNOSIS — E538 Deficiency of other specified B group vitamins: Secondary | ICD-10-CM

## 2019-01-19 MED ORDER — CYANOCOBALAMIN 1000 MCG/ML IJ SOLN
1000.0000 ug | Freq: Once | INTRAMUSCULAR | Status: AC
  Administered 2019-01-19: 1000 ug via INTRAMUSCULAR

## 2019-01-19 NOTE — Progress Notes (Signed)
[name] presents today for injection per MD orders. B12 injection administered IM in right Upper Arm. Administration without incident. Patient tolerated well.  Nina,cma

## 2019-01-20 ENCOUNTER — Ambulatory Visit (INDEPENDENT_AMBULATORY_CARE_PROVIDER_SITE_OTHER): Payer: Medicare Other | Admitting: Urology

## 2019-01-20 ENCOUNTER — Encounter: Payer: Self-pay | Admitting: Urology

## 2019-01-20 VITALS — BP 193/85 | HR 72 | Ht 62.0 in

## 2019-01-20 DIAGNOSIS — Z85528 Personal history of other malignant neoplasm of kidney: Secondary | ICD-10-CM

## 2019-01-20 DIAGNOSIS — I70219 Atherosclerosis of native arteries of extremities with intermittent claudication, unspecified extremity: Secondary | ICD-10-CM | POA: Diagnosis not present

## 2019-01-20 DIAGNOSIS — N3281 Overactive bladder: Secondary | ICD-10-CM

## 2019-01-20 NOTE — Progress Notes (Signed)
01/20/2019 3:39 PM   Caroline Nichols 09-10-1940 [id number]  Referring provider: Sherlene Shams, MD [address] [place],  [place] [address]  Chief Complaint  Patient presents with  . Review reuslts    HPI: 78 year old female who returns today for her last surveillance imaging following partial nephrectomy in 2017.  Her surgical pathology was consistent with clear cell carcinoma, negative margins, 2 cm, Fuhrman grade 2.  She said no recurrence of her renal lesion.  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  Really NED.  Today with C CT of the chest abdomen pelvis.  This shows no evidence of local recurrence or metastatic disease in regard to her renal cell carcinoma.  Her left infrahilar area and her lungs are consistent with radiation changes.  She is a incidental new left lateral rib fracture appreciated.  No or gross hematuria.  She continues to have baseline incontinence unchanged.  She will be seeing Dr. Orlie Dakin later this week for lung cancer.   PMH: Past Medical History:  Diagnosis Date  . Anemia 02/2013   F/U with Dr. Orlie Dakin for Feraheme injections  . Anxiety   . Arthritis    Possible RA - Follow up appt with Dr. Gavin Potters  . Cellulitis of arm, right   . Diabetes insipidus (HCC)    On desmopressin  . GERD (gastroesophageal reflux disease)   . H/O seasonal allergies   . H/O transfusion of packed red blood cells 02/2013   3 units PRBC for severe anemia 02/2013  . Herniated disc   . History of kidney stones   . Hyperlipidemia   . Hypertension   . Hyperthyroidism    s/p radiation therapy, now hypothyroidism  . IBS (irritable bowel syndrome)   . Lung cancer (HCC) 06/2017   left  . Migraine   . Pacemaker    a. s/p successful implantation of a Medtronic CapSureFix Novus MRIT SureScan serial # [id number] on 06/28/16  by Dr. Graciela Husbands for sinus node dysfunction.  . Pancreatic cyst   . Paroxysmal A-fib (HCC)   . Renal cell cancer, right (HCC) 10/23/2015   Right partial nephrectomy.    Surgical History: Past Surgical History:  Procedure Laterality Date  . ABDOMINAL HYSTERECTOMY    . BREAST EXCISIONAL BIOPSY Right 2005   neg  . BREAST SURGERY  1990s   Biopsy  . CERVICAL SPINE SURGERY     Bone fusion  . [place]  2008?   [place]  . COLONOSCOPY WITH PROPOFOL N/A 10/15/2016   Procedure: COLONOSCOPY WITH PROPOFOL;  Surgeon: Earline Mayotte, MD;  Location: [facility] ENDOSCOPY;  Service: Endoscopy;  Laterality: N/A;  . EP IMPLANTABLE DEVICE N/A 06/28/2016   Procedure: Pacemaker Implant;  Surgeon: Duke Salvia, MD;  Location: [facility] INVASIVE CV LAB;  Service: Cardiovascular;  Laterality: N/A;  . HAMMER TOE SURGERY    . HERNIA REPAIR Left    Inguinal Hernia Repair  . PORTA CATH INSERTION N/A 07/24/2017   Procedure: PORTA CATH INSERTION;  Surgeon: Annice Needy, MD;  Location: [facility] INVASIVE CV LAB;  Service: Cardiovascular;  Laterality: N/A;  . ROBOTIC ASSITED PARTIAL NEPHRECTOMY Right 10/23/2015   Procedure: ROBOTIC ASSITED PARTIAL NEPHRECTOMY with intraop ultrasound;  Surgeon: Vanna Scotland, MD;  Location: [facility] ORS;  Service: Urology;  Laterality: Right;  . TUBAL LIGATION      Home Medications:  Allergies as of 01/20/2019  Reactions   Metrizamide    Other reaction(s): Other (See Comments)   Adhesive [tape] Other (See Comments)   "irritate skin"   Augmentin [amoxicillin-pot Clavulanate] Diarrhea   Severe diarrhea   Codeine    REACTION: NAUSEA   Morphine And Related Other (See Comments)   Shut down her organs   Other    Skin irritation   Tetanus Toxoid    REACTION: ARM SWELLING,REDNESS   Tramadol Nausea And Vomiting      Medication List       Accurate as of January 20, 2019  3:39 PM. If you have any questions, ask your nurse or doctor.        acetaminophen 325 MG tablet Commonly  known as: TYLENOL Take 2 tablets (650 mg total) by mouth every 6 (six) hours as needed for pain.   albuterol 108 (90 Base) MCG/ACT inhaler Commonly known as: VENTOLIN HFA Inhale 2 puffs into the lungs every 6 (six) hours as needed for wheezing or shortness of breath.   ALPRAZolam 0.25 MG tablet Commonly known as: XANAX TAKE ONE TABLET BY MOUTH TWICE A DAY AS NEEDED FOR ANXIETY   brimonidine 0.2 % ophthalmic solution Commonly known as: ALPHAGAN Place 1 drop into both eyes 2 (two) times daily.   Calcium-Carb 600 + D 600-125 MG-UNIT Tabs Generic drug: Calcium Carbonate-Vitamin D Take 2 tablets by mouth daily.   carvedilol 6.25 MG tablet Commonly known as: COREG TAKE ONE TABLET BY MOUTH TWICE A DAY   cholecalciferol 1000 units tablet Commonly known as: VITAMIN D Take 1,000 Units by mouth daily.   dorzolamide 2 % ophthalmic solution Commonly known as: TRUSOPT Place 1 drop into both eyes 2 (two) times daily.   ferrous sulfate 325 (65 FE) MG tablet Take 325 mg by mouth daily with breakfast.   Fish Oil 1000 MG Caps Take 1,000 mg by mouth daily.   furosemide 20 MG tablet Commonly known as: Lasix Take 0.5 tablets (10 mg total) by mouth daily.   gabapentin 300 MG capsule Commonly known as: NEURONTIN TAKE ONE CAPSULE BY MOUTH THREE TIMES A DAY AS NEEDED FOR SHINGLES PAIN   hydroxychloroquine 200 MG tablet Commonly known as: PLAQUENIL Take 400 mg by mouth daily.   latanoprost 0.005 % ophthalmic solution Commonly known as: XALATAN Place 1 drop into both eyes at bedtime.   levothyroxine 112 MCG tablet Commonly known as: SYNTHROID TAKE ONE TABLET BY MOUTH EVERY MORNING   losartan 50 MG tablet Commonly known as: COZAAR TAKE ONE TABLET BY MOUTH DAILY   mirabegron ER 50 MG Tb24 tablet Commonly known as: MYRBETRIQ Take 1 tablet (50 mg total) by mouth daily.   mirtazapine 15 MG tablet Commonly known as: REMERON TAKE ONE TABLET BY MOUTH EVERY NIGHT AT BEDTIME     nitroGLYCERIN 0.4 MG/SPRAY spray Commonly known as: NITROLINGUAL Place 1 spray under the tongue every 5 (five) minutes x 3 doses as needed for chest pain.   pantoprazole 40 MG tablet Commonly known as: PROTONIX TAKE ONE TABLET BY MOUTH DAILY   PARoxetine 10 MG tablet Commonly known as: Paxil Take 1 tablet (10 mg total) by mouth daily. After dinner   timolol 0.5 % ophthalmic solution Commonly known as: TIMOPTIC Place 1 drop into both eyes 2 (two) times daily.   vitamin C 500 MG tablet Commonly known as: ASCORBIC ACID Take 500 mg by mouth daily.       Allergies:  Allergies  Allergen Reactions  . Metrizamide     Other  reaction(s): Other (See Comments)  . Adhesive [Tape] Other (See Comments)    "irritate skin"  . Augmentin [Amoxicillin-Pot Clavulanate] Diarrhea    Severe diarrhea  . Codeine     REACTION: NAUSEA  . Morphine And Related Other (See Comments)    Shut down her organs  . Other     Skin irritation  . Tetanus Toxoid     REACTION: ARM SWELLING,REDNESS  . Tramadol Nausea And Vomiting    Family History: Family History  Problem Relation Age of Onset  . Heart disease Mother   . Heart disease Father   . Diabetes Brother   . Kidney disease Brother   . Stroke Daughter        due to medication reaction  . Breast cancer Cousin   . Prostate cancer Neg Hx   . Hematuria Neg Hx     Social History:  reports that she quit smoking about 6 years ago. Her smoking use included cigarettes. She has a 15.00 pack-year smoking history. She has never used smokeless tobacco. She reports that she does not drink alcohol or use drugs.  ROS: UROLOGY Frequent Urination?: Yes Hard to postpone urination?: No Burning/pain with urination?: No Get up at night to urinate?: Yes Leakage of urine?: No Urine stream starts and stops?: Yes Trouble starting stream?: No Do you have to strain to urinate?: No Blood in urine?: No Urinary tract infection?: No Sexually transmitted  disease?: No Injury to kidneys or bladder?: No Painful intercourse?: No Weak stream?: No Currently pregnant?: No Vaginal bleeding?: No Last menstrual period?: n  Gastrointestinal Nausea?: No Vomiting?: No Indigestion/heartburn?: No Diarrhea?: No Constipation?: No  Constitutional Fever: No Weight loss?: No Fatigue?: No  Skin Skin rash/lesions?: No Itching?: No  Eyes Blurred vision?: No Double vision?: No  Ears/Nose/Throat Sore throat?: No Sinus problems?: Yes  Hematologic/Lymphatic Swollen glands?: No Easy bruising?: No  Cardiovascular Leg swelling?: No Chest pain?: No  Respiratory Cough?: No Shortness of breath?: No  Endocrine Excessive thirst?: No  Musculoskeletal Back pain?: No Joint pain?: No  Neurological Headaches?: No Dizziness?: No  Psychologic Depression?: No Anxiety?: Yes   Physical Exam: BP (!) 193/85   Pulse 72   Ht 5\' 2"  (1.575 m)   BMI 24.33 kg/m   Constitutional:  Alert and oriented, No acute distress. HEENT: [place] AT, moist mucus membranes.  Trachea midline, no masses. Cardiovascular: No clubbing, cyanosis, or edema. Respiratory: Normal respiratory effort, no increased work of breathing. Skin: No rashes, bruises or suspicious lesions. Neurologic: Grossly intact, no focal deficits, moving all 4 extremities. Psychiatric: Normal mood and affect.  Laboratory Data: Lab Results  Component Value Date   WBC 3.7 (L) 08/21/2018   HGB 12.2 08/21/2018   HCT 36.1 08/21/2018   MCV 91.6 08/21/2018   PLT 95.0 (L) 08/21/2018    Lab Results  Component Value Date   CREATININE 0.88 01/12/2019    Lab Results  Component Value Date   HGBA1C 5.2 01/09/2018    Pertinent Imaging: CLINICAL DATA:  Lung cancer. Radiation therapy 2 stage IIB large-cell neuroendocrine tumor of the LEFT lung.  EXAM: CT CHEST, ABDOMEN, AND PELVIS WITH CONTRAST  TECHNIQUE: Multidetector CT imaging of the chest, abdomen and pelvis was performed  following the standard protocol during bolus administration of intravenous contrast.  CONTRAST:  80mL OMNIPAQUE IOHEXOL 300 MG/ML  SOLN  COMPARISON:  CT 07/13/2018  FINDINGS: CT CHEST FINDINGS  Cardiovascular: Port in the anterior chest wall with tip in distal SVC. No significant vascular findings.  Normal heart size. No pericardial effusion.  Mediastinum/Nodes: No axillary supraclavicular nodes. No mediastinal hilar nodes. No pericardial fluid. Esophagus normal.  Lungs/Pleura: Centrilobular emphysema the upper lobes.  Band of perihilar parenchymal thickening extending from the LEFT infrahilar region is unchanged from comparison exam (image 91/4).  No new nodularity.  Musculoskeletal: No aggressive osseous lesion. Moped healed 6 lateral LEFT rib fracture. More recent lateral seventh rib fracture (image 86/3)  CT ABDOMEN AND PELVIS FINDINGS  Hepatobiliary: No focal hepatic lesion. No biliary ductal dilatation. Gallbladder is normal. Common bile duct is normal.  Pancreas: Pancreas is normal. No ductal dilatation. No pancreatic inflammation.  Spleen: Normal spleen  Adrenals/urinary tract: Adrenal glands normal. Nonobstructing calculus lower pole of the RIGHT kidney. Simple fluid attenuation cyst of the RIGHT kidney. Ureters and bladder normal.  Stomach/Bowel: Stomach, small bowel, appendix, and cecum are normal. The colon and rectosigmoid colon are normal.  Vascular/Lymphatic: Abdominal aorta is normal caliber with atherosclerotic calcification. There is no retroperitoneal or periportal lymphadenopathy. No pelvic lymphadenopathy.  Reproductive: Post hysterectomy.  Other: No free fluid.  Musculoskeletal: No aggressive osseous lesion. Multiple levels of endplate sclerosis.  IMPRESSION: Chest Impression:  1. Stable LEFT infrahilar post radiation change. 2. No evidence of lung cancer recurrence in thorax. 3. Centrilobular emphysema in the  upper lobes. 4. Interval LEFT lateral rib fracture compared to CT 07/13/2018.  Abdomen / Pelvis Impression:  1. No evidence of metastatic disease in the abdomen pelvis 2. Degenerate changes of the spine. No evidence of skeletal metastasis   Electronically Signed   By: Genevive Bi M.D.   On: 01/13/2019 11:32  CT chest abdomen pelvis was personally agreed reviewed.  Agree with radiologic interpretation.  Assessment & Plan:    1. History of renal cell cancer Continues to be NED Given that she has been now 3 years without evidence of recurrence, no additional surveillance imaging is needed per NCCN guidelines She is agreeable this plan Okay for discharge from urological practice  2. OAB (overactive bladder) Symptoms stable on Myrbetriq 50 mg This can be continued by PCP  It has been pleasure taking care of Ms. Lajuana Ripple.  She is very grateful and thankful for urologic care.  Her back as needed.   Vanna Scotland, MD  [facility] [address] [place], [place] [address] [phone]

## 2019-01-22 ENCOUNTER — Other Ambulatory Visit: Payer: Self-pay | Admitting: Oncology

## 2019-01-22 ENCOUNTER — Other Ambulatory Visit: Payer: Self-pay | Admitting: Urology

## 2019-01-23 NOTE — Progress Notes (Signed)
Cancer Center @ [facility] [phone]  [phone]     Caroline Nichols OB: February 09, 1941  MR#: [id number]  LKG#:[id number]  Patient Care Team: Sherlene Shams, MD as PCP - General (Internal Medicine) Sherlene Shams, MD (Internal Medicine) Lemar Livings, Merrily Pew, MD (General Surgery) Glory Buff, RN as Registered Nurse Orlie Dakin, Tollie Pizza, MD as Consulting Physician (Oncology) Carmina Miller, MD as Referring Physician (Radiation Oncology)  I connected with Caroline Nichols on 01/27/19 at 10:45 AM EDT by telephone visit and verified that I am speaking with the correct person using two identifiers.   I discussed the limitations, risks, security and privacy concerns of performing an evaluation and management service by telemedicine and the availability of in-person appointments. I also discussed with the patient that there may be a patient responsible charge related to this service. The patient expressed understanding and agreed to proceed.   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  HISTORY OF PRESENT ILLNESS: Patient agreed to further evaluation and discussion of her imaging results via telephone today.  She reports occasional hot flashes, but otherwise feels well.  She does not complain of weakness or fatigue today.  She does not complain of shortness of breath today.  She denies any recent fevers or illnesses.  She does not complain of pain today. She has no neurologic complaints. She has a good appetite and denies weight loss.  She denies any chest pain, cough, or hemoptysis.  She denies any nausea, vomiting, constipation, or diarrhea.  She has no urinary complaints.  Patient offers no further specific complaints today.  REVIEW OF SYSTEMS:    Review of  Systems  Constitutional: Negative.  Negative for fever, malaise/fatigue and weight loss.  HENT: Negative.   Eyes: Negative.  Negative for blurred vision, double vision and pain.  Respiratory: Negative.  Negative for cough and shortness of breath.   Cardiovascular: Negative.  Negative for chest pain and leg swelling.  Gastrointestinal: Negative.  Negative for abdominal pain, diarrhea and nausea.  Genitourinary: Negative.  Negative for dysuria.  Musculoskeletal: Negative.  Negative for falls.  Skin: Negative.  Negative for rash.  Neurological: Positive for sensory change. Negative for focal weakness, weakness and headaches.  Psychiatric/Behavioral: Negative.  The patient is not nervous/anxious.   All other systems reviewed and are negative.   PAST MEDICAL HISTORY: Past Medical History:  Diagnosis Date  . Anemia 02/2013   F/U with Dr. Orlie Dakin for Feraheme injections  . Anxiety   . Arthritis    Possible RA - Follow up appt with Dr. Gavin Potters  . Cellulitis of arm, right   . Diabetes insipidus (HCC)    On desmopressin  . GERD (gastroesophageal reflux disease)   . H/O seasonal allergies   . H/O transfusion of packed red blood cells 02/2013   3 units PRBC for severe anemia 02/2013  . Herniated disc   . History of kidney stones   . Hyperlipidemia   . Hypertension   . Hyperthyroidism    s/p radiation therapy, now hypothyroidism  . IBS (irritable bowel syndrome)   . Lung cancer (HCC) 06/2017   left  . Migraine   . Pacemaker    a. s/p successful implantation of a Medtronic CapSureFix Novus MRIT SureScan serial # [id number] on 06/28/16 by Dr. Graciela Husbands for sinus  node dysfunction.  . Pancreatic cyst   . Paroxysmal A-fib (HCC)   . Renal cell cancer, right (HCC) 10/23/2015   Right partial nephrectomy.    PAST SURGICAL HISTORY: Past Surgical History:  Procedure Laterality Date  . ABDOMINAL HYSTERECTOMY    . BREAST EXCISIONAL BIOPSY Right 2005   neg  . BREAST SURGERY  1990s   Biopsy    . CERVICAL SPINE SURGERY     Bone fusion  . [place]  2008?   [place]  . COLONOSCOPY WITH PROPOFOL N/A 10/15/2016   Procedure: COLONOSCOPY WITH PROPOFOL;  Surgeon: Earline Mayotte, MD;  Location: [facility] ENDOSCOPY;  Service: Endoscopy;  Laterality: N/A;  . EP IMPLANTABLE DEVICE N/A 06/28/2016   Procedure: Pacemaker Implant;  Surgeon: Duke Salvia, MD;  Location: [facility] INVASIVE CV LAB;  Service: Cardiovascular;  Laterality: N/A;  . HAMMER TOE SURGERY    . HERNIA REPAIR Left    Inguinal Hernia Repair  . PORTA CATH INSERTION N/A 07/24/2017   Procedure: PORTA CATH INSERTION;  Surgeon: Annice Needy, MD;  Location: [facility] INVASIVE CV LAB;  Service: Cardiovascular;  Laterality: N/A;  . ROBOTIC ASSITED PARTIAL NEPHRECTOMY Right 10/23/2015   Procedure: ROBOTIC ASSITED PARTIAL NEPHRECTOMY with intraop ultrasound;  Surgeon: Vanna Scotland, MD;  Location: [facility] ORS;  Service: Urology;  Laterality: Right;  . TUBAL LIGATION      FAMILY HISTORY Family History  Problem Relation Age of Onset  . Heart disease Mother   . Heart disease Father   . Diabetes Brother   . Kidney disease Brother   . Stroke Daughter        due to medication reaction  . Breast cancer Cousin   . Prostate cancer Neg Hx   . Hematuria Neg Hx     ADVANCED DIRECTIVES:  No flowsheet data found.  HEALTH MAINTENANCE: Social History   Tobacco Use  . Smoking status: Former Smoker    Packs/day: 0.50    Years: 30.00    Pack years: 15.00    Types: Cigarettes    Quit date: 12/03/2012    Years since quitting: 6.1  . Smokeless tobacco: Never Used  Substance Use Topics  . Alcohol use: No    Alcohol/week: 0.0 standard drinks  . Drug use: No     Allergies  Allergen Reactions  . Metrizamide     Other reaction(s): Other (See Comments)  . Adhesive [Tape] Other (See Comments)    "irritate skin"  . Augmentin [Amoxicillin-Pot Clavulanate] Diarrhea    Severe diarrhea  . Codeine     REACTION: NAUSEA  . Morphine And Related  Other (See Comments)    Shut down her organs  . Other     Skin irritation  . Tetanus Toxoid     REACTION: ARM SWELLING,REDNESS  . Tramadol Nausea And Vomiting    Current Outpatient Medications  Medication Sig Dispense Refill  . acetaminophen (TYLENOL) 325 MG tablet Take 2 tablets (650 mg total) by mouth every 6 (six) hours as needed for pain. (Patient taking differently: Take 650 mg by mouth every 6 (six) hours as needed for pain. )    . albuterol (PROVENTIL HFA;VENTOLIN HFA) 108 (90 Base) MCG/ACT inhaler Inhale 2 puffs into the lungs every 6 (six) hours as needed for wheezing or shortness of breath. 1 Inhaler 2  . ALPRAZolam (XANAX) 0.25 MG tablet TAKE ONE TABLET BY MOUTH TWICE A DAY AS NEEDED FOR ANXIETY 60 tablet 3  . brimonidine (ALPHAGAN) 0.2 % ophthalmic solution Place 1  drop into both eyes 2 (two) times daily.    . Calcium Carbonate-Vitamin D (CALCIUM-CARB 600 + D) 600-125 MG-UNIT TABS Take 2 tablets by mouth daily.    . carvedilol (COREG) 6.25 MG tablet TAKE ONE TABLET BY MOUTH TWICE A DAY 180 tablet 1  . cholecalciferol (VITAMIN D) 1000 units tablet Take 1,000 Units by mouth daily.    . dorzolamide (TRUSOPT) 2 % ophthalmic solution Place 1 drop into both eyes 2 (two) times daily.     . ferrous sulfate 325 (65 FE) MG tablet Take 325 mg by mouth daily with breakfast.    . furosemide (LASIX) 20 MG tablet Take 0.5 tablets (10 mg total) by mouth daily. 45 tablet 4  . gabapentin (NEURONTIN) 300 MG capsule TAKE ONE CAPSULE BY MOUTH THREE TIMES A DAY AS NEEDED FOR SHINGLES PAIN 90 capsule 1  . hydroxychloroquine (PLAQUENIL) 200 MG tablet Take 400 mg by mouth daily.     Marland Kitchen latanoprost (XALATAN) 0.005 % ophthalmic solution Place 1 drop into both eyes at bedtime.     Marland Kitchen levothyroxine (SYNTHROID) 112 MCG tablet TAKE ONE TABLET BY MOUTH EVERY MORNING 90 tablet 1  . losartan (COZAAR) 50 MG tablet TAKE ONE TABLET BY MOUTH DAILY 90 tablet 1  . mirtazapine (REMERON) 15 MG tablet TAKE ONE TABLET BY  MOUTH EVERY NIGHT AT BEDTIME 30 tablet 0  . MYRBETRIQ 50 MG TB24 tablet TAKE ONE TABLET BY MOUTH DAILY 30 tablet 4  . nitroGLYCERIN (NITROLINGUAL) 0.4 MG/SPRAY spray Place 1 spray under the tongue every 5 (five) minutes x 3 doses as needed for chest pain. 4.9 g 3  . Omega-3 Fatty Acids (FISH OIL) 1000 MG CAPS Take 1,000 mg by mouth daily.     . pantoprazole (PROTONIX) 40 MG tablet TAKE ONE TABLET BY MOUTH DAILY 30 tablet 4  . PARoxetine (PAXIL) 10 MG tablet Take 1 tablet (10 mg total) by mouth daily. After dinner 30 tablet 5  . timolol (TIMOPTIC) 0.5 % ophthalmic solution Place 1 drop into both eyes 2 (two) times daily.     . vitamin C (ASCORBIC ACID) 500 MG tablet Take 500 mg by mouth daily.     No current facility-administered medications for this visit.     OBJECTIVE:  There were no vitals filed for this visit.   There is no height or weight on file to calculate BMI.    ECOG FS:0 - Asymptomatic   LAB RESULTS:  No visits with results within 5 Day(s) from this visit.  Latest known visit with results is:  Infusion on 01/12/2019  Component Date Value Ref Range Status  . Creatinine, Ser 01/12/2019 0.88  0.44 - 1.00 mg/dL Final  . GFR calc non Af Amer 01/12/2019 >60  >60 mL/min Final  . GFR calc Af Amer 01/12/2019 >60  >60 mL/min Final   Performed at [facility], [address], [place], [place] [address]       STUDIES: Ct Chest W Contrast  Result Date: 01/13/2019 CLINICAL DATA:  Lung cancer. Radiation therapy 2 stage IIB large-cell neuroendocrine tumor of the LEFT lung. EXAM: CT CHEST, ABDOMEN, AND PELVIS WITH CONTRAST TECHNIQUE: Multidetector CT imaging of the chest, abdomen and pelvis was performed following the standard protocol during bolus administration of intravenous contrast. CONTRAST:  80mL OMNIPAQUE IOHEXOL 300 MG/ML  SOLN COMPARISON:  CT 07/13/2018 FINDINGS: CT CHEST FINDINGS Cardiovascular: Port in the anterior chest wall with tip in distal SVC. No significant  vascular findings. Normal heart size. No  pericardial effusion. Mediastinum/Nodes: No axillary supraclavicular nodes. No mediastinal hilar nodes. No pericardial fluid. Esophagus normal. Lungs/Pleura: Centrilobular emphysema the upper lobes. Band of perihilar parenchymal thickening extending from the LEFT infrahilar region is unchanged from comparison exam (image 91/4). No new nodularity. Musculoskeletal: No aggressive osseous lesion. Moped healed 6 lateral LEFT rib fracture. More recent lateral seventh rib fracture (image 86/3) CT ABDOMEN AND PELVIS FINDINGS Hepatobiliary: No focal hepatic lesion. No biliary ductal dilatation. Gallbladder is normal. Common bile duct is normal. Pancreas: Pancreas is normal. No ductal dilatation. No pancreatic inflammation. Spleen: Normal spleen Adrenals/urinary tract: Adrenal glands normal. Nonobstructing calculus lower pole of the RIGHT kidney. Simple fluid attenuation cyst of the RIGHT kidney. Ureters and bladder normal. Stomach/Bowel: Stomach, small bowel, appendix, and cecum are normal. The colon and rectosigmoid colon are normal. Vascular/Lymphatic: Abdominal aorta is normal caliber with atherosclerotic calcification. There is no retroperitoneal or periportal lymphadenopathy. No pelvic lymphadenopathy. Reproductive: Post hysterectomy. Other: No free fluid. Musculoskeletal: No aggressive osseous lesion. Multiple levels of endplate sclerosis. IMPRESSION: Chest Impression: 1. Stable LEFT infrahilar post radiation change. 2. No evidence of lung cancer recurrence in thorax. 3. Centrilobular emphysema in the upper lobes. 4. Interval LEFT lateral rib fracture compared to CT 07/13/2018. Abdomen / Pelvis Impression: 1. No evidence of metastatic disease in the abdomen pelvis 2. Degenerate changes of the spine. No evidence of skeletal metastasis Electronically Signed   By: Genevive Bi M.D.   On: 01/13/2019 11:32   Ct Abdomen Pelvis W Contrast  Result Date: 01/13/2019 CLINICAL DATA:   Lung cancer. Radiation therapy 2 stage IIB large-cell neuroendocrine tumor of the LEFT lung. EXAM: CT CHEST, ABDOMEN, AND PELVIS WITH CONTRAST TECHNIQUE: Multidetector CT imaging of the chest, abdomen and pelvis was performed following the standard protocol during bolus administration of intravenous contrast. CONTRAST:  80mL OMNIPAQUE IOHEXOL 300 MG/ML  SOLN COMPARISON:  CT 07/13/2018 FINDINGS: CT CHEST FINDINGS Cardiovascular: Port in the anterior chest wall with tip in distal SVC. No significant vascular findings. Normal heart size. No pericardial effusion. Mediastinum/Nodes: No axillary supraclavicular nodes. No mediastinal hilar nodes. No pericardial fluid. Esophagus normal. Lungs/Pleura: Centrilobular emphysema the upper lobes. Band of perihilar parenchymal thickening extending from the LEFT infrahilar region is unchanged from comparison exam (image 91/4). No new nodularity. Musculoskeletal: No aggressive osseous lesion. Moped healed 6 lateral LEFT rib fracture. More recent lateral seventh rib fracture (image 86/3) CT ABDOMEN AND PELVIS FINDINGS Hepatobiliary: No focal hepatic lesion. No biliary ductal dilatation. Gallbladder is normal. Common bile duct is normal. Pancreas: Pancreas is normal. No ductal dilatation. No pancreatic inflammation. Spleen: Normal spleen Adrenals/urinary tract: Adrenal glands normal. Nonobstructing calculus lower pole of the RIGHT kidney. Simple fluid attenuation cyst of the RIGHT kidney. Ureters and bladder normal. Stomach/Bowel: Stomach, small bowel, appendix, and cecum are normal. The colon and rectosigmoid colon are normal. Vascular/Lymphatic: Abdominal aorta is normal caliber with atherosclerotic calcification. There is no retroperitoneal or periportal lymphadenopathy. No pelvic lymphadenopathy. Reproductive: Post hysterectomy. Other: No free fluid. Musculoskeletal: No aggressive osseous lesion. Multiple levels of endplate sclerosis. IMPRESSION: Chest Impression: 1. Stable LEFT  infrahilar post radiation change. 2. No evidence of lung cancer recurrence in thorax. 3. Centrilobular emphysema in the upper lobes. 4. Interval LEFT lateral rib fracture compared to CT 07/13/2018. Abdomen / Pelvis Impression: 1. No evidence of metastatic disease in the abdomen pelvis 2. Degenerate changes of the spine. No evidence of skeletal metastasis Electronically Signed   By: Genevive Bi M.D.   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  Plan:  1. 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  Patient completed cycle 3 of carboplatinum and etoposide on September 19, 2017.  She also completed XRT.  Given her persistent pancytopenia, treatment was discontinued.  Patient's most recent CT scan on January 13, 2019 was reviewed independently and reported as above with no obvious evidence of recurrent or progressive disease.  No intervention is needed at this time.  Return to clinic in 6 months with repeat imaging and further evaluation.     2.  Pancytopenia: Previously, bone marrow biopsy revealed no evidence of MDS or other abnormality.  It is possible it is related to her Plaquenil or rheumatoid arthritis. The remainder of her laboratory work was either negative or within normal limits.   3. Renal cell carcinoma of the right kidney: Stage T1a N0 M0.  Patient underwent partial nephrectomy on October 23, 2015.  Imaging results as above.  Patient reports she has been discharged from urology clinic. 4.  Rheumatoid arthritis: Continue Plaquenil. Continue follow-up per rheumatology.   I provided 25 minutes of non face-to-face telephone visit time during this encounter, and > 50% was spent counseling as documented under my assessment & plan.   Patient expressed understanding and was in agreement with this plan. She also understands that She can call clinic at any  time with any questions, concerns, or complaints.    Jeralyn Ruths, MD   01/27/2019 7:16 AM

## 2019-01-25 ENCOUNTER — Other Ambulatory Visit: Payer: Self-pay

## 2019-01-26 ENCOUNTER — Inpatient Hospital Stay (HOSPITAL_BASED_OUTPATIENT_CLINIC_OR_DEPARTMENT_OTHER): Payer: Medicare Other | Admitting: Oncology

## 2019-01-26 DIAGNOSIS — C7A8 Other malignant neuroendocrine tumors: Secondary | ICD-10-CM | POA: Diagnosis not present

## 2019-01-26 DIAGNOSIS — Z87891 Personal history of nicotine dependence: Secondary | ICD-10-CM | POA: Diagnosis not present

## 2019-01-26 DIAGNOSIS — C641 Malignant neoplasm of right kidney, except renal pelvis: Secondary | ICD-10-CM

## 2019-01-26 NOTE — Progress Notes (Signed)
Patient reports she continues to have hot flashes and night sweats.

## 2019-02-15 ENCOUNTER — Other Ambulatory Visit: Payer: Self-pay | Admitting: Internal Medicine

## 2019-02-18 ENCOUNTER — Other Ambulatory Visit: Payer: Self-pay | Admitting: Oncology

## 2019-02-23 ENCOUNTER — Ambulatory Visit (INDEPENDENT_AMBULATORY_CARE_PROVIDER_SITE_OTHER): Payer: Medicare Other

## 2019-02-23 ENCOUNTER — Other Ambulatory Visit: Payer: Self-pay

## 2019-02-23 DIAGNOSIS — E538 Deficiency of other specified B group vitamins: Secondary | ICD-10-CM

## 2019-02-23 MED ORDER — CYANOCOBALAMIN 1000 MCG/ML IJ SOLN
1000.0000 ug | Freq: Once | INTRAMUSCULAR | Status: AC
  Administered 2019-02-23: 1000 ug via INTRAMUSCULAR

## 2019-02-23 NOTE — Progress Notes (Addendum)
Patient presented today for B12 injection.  Administered IM in left deltoid.  Patient tolerated well with no signs of distress.  Reviewed.  Dr Scott  

## 2019-03-01 ENCOUNTER — Other Ambulatory Visit: Payer: Self-pay | Admitting: Internal Medicine

## 2019-03-10 ENCOUNTER — Other Ambulatory Visit: Payer: Self-pay | Admitting: Internal Medicine

## 2019-03-15 ENCOUNTER — Other Ambulatory Visit: Payer: Self-pay

## 2019-03-16 ENCOUNTER — Inpatient Hospital Stay: Payer: Medicare Other | Attending: Oncology

## 2019-03-16 ENCOUNTER — Other Ambulatory Visit: Payer: Self-pay

## 2019-03-16 DIAGNOSIS — C641 Malignant neoplasm of right kidney, except renal pelvis: Secondary | ICD-10-CM | POA: Diagnosis not present

## 2019-03-16 DIAGNOSIS — Z452 Encounter for adjustment and management of vascular access device: Secondary | ICD-10-CM | POA: Insufficient documentation

## 2019-03-16 DIAGNOSIS — C7A8 Other malignant neuroendocrine tumors: Secondary | ICD-10-CM | POA: Diagnosis not present

## 2019-03-16 DIAGNOSIS — Z95828 Presence of other vascular implants and grafts: Secondary | ICD-10-CM

## 2019-03-16 MED ORDER — SODIUM CHLORIDE 0.9% FLUSH
10.0000 mL | Freq: Once | INTRAVENOUS | Status: AC
  Administered 2019-03-16: 10 mL via INTRAVENOUS
  Filled 2019-03-16: qty 10

## 2019-03-16 MED ORDER — HEPARIN SOD (PORK) LOCK FLUSH 100 UNIT/ML IV SOLN
500.0000 [IU] | Freq: Once | INTRAVENOUS | Status: AC
  Administered 2019-03-16: 500 [IU] via INTRAVENOUS

## 2019-03-18 ENCOUNTER — Ambulatory Visit (INDEPENDENT_AMBULATORY_CARE_PROVIDER_SITE_OTHER): Payer: Medicare Other | Admitting: *Deleted

## 2019-03-18 DIAGNOSIS — I495 Sick sinus syndrome: Secondary | ICD-10-CM | POA: Diagnosis not present

## 2019-03-19 LAB — CUP PACEART REMOTE DEVICE CHECK
Battery Remaining Longevity: 97 mo
Battery Voltage: 3.02 V
Brady Statistic AP VP Percent: 0.01 %
Brady Statistic AP VS Percent: 1.72 %
Brady Statistic AS VP Percent: 0.06 %
Brady Statistic AS VS Percent: 98.21 %
Brady Statistic RA Percent Paced: 1.73 %
Brady Statistic RV Percent Paced: 0.07 %
Date Time Interrogation Session: 20200813213150
Implantable Lead Implant Date: 20171124
Implantable Lead Implant Date: 20171124
Implantable Lead Location: 753859
Implantable Lead Location: 753860
Implantable Lead Model: 5076
Implantable Lead Model: 5076
Implantable Pulse Generator Implant Date: 20171124
Lead Channel Impedance Value: 304 Ohm
Lead Channel Impedance Value: 361 Ohm
Lead Channel Impedance Value: 399 Ohm
Lead Channel Impedance Value: 494 Ohm
Lead Channel Pacing Threshold Amplitude: 0.75 V
Lead Channel Pacing Threshold Amplitude: 0.875 V
Lead Channel Pacing Threshold Pulse Width: 0.4 ms
Lead Channel Pacing Threshold Pulse Width: 0.4 ms
Lead Channel Sensing Intrinsic Amplitude: 1.5 mV
Lead Channel Sensing Intrinsic Amplitude: 1.5 mV
Lead Channel Sensing Intrinsic Amplitude: 14.125 mV
Lead Channel Sensing Intrinsic Amplitude: 14.125 mV
Lead Channel Setting Pacing Amplitude: 2 V
Lead Channel Setting Pacing Amplitude: 2.5 V
Lead Channel Setting Pacing Pulse Width: 0.4 ms
Lead Channel Setting Sensing Sensitivity: 0.9 mV

## 2019-03-29 ENCOUNTER — Encounter: Payer: Self-pay | Admitting: Cardiology

## 2019-03-29 NOTE — Progress Notes (Signed)
Remote pacemaker transmission.   

## 2019-03-30 ENCOUNTER — Other Ambulatory Visit: Payer: Self-pay

## 2019-03-30 ENCOUNTER — Ambulatory Visit (INDEPENDENT_AMBULATORY_CARE_PROVIDER_SITE_OTHER): Payer: Medicare Other

## 2019-03-30 DIAGNOSIS — E538 Deficiency of other specified B group vitamins: Secondary | ICD-10-CM

## 2019-03-30 DIAGNOSIS — M0579 Rheumatoid arthritis with rheumatoid factor of multiple sites without organ or systems involvement: Secondary | ICD-10-CM | POA: Diagnosis not present

## 2019-03-30 DIAGNOSIS — G629 Polyneuropathy, unspecified: Secondary | ICD-10-CM | POA: Diagnosis not present

## 2019-03-30 DIAGNOSIS — D72819 Decreased white blood cell count, unspecified: Secondary | ICD-10-CM | POA: Diagnosis not present

## 2019-03-30 MED ORDER — CYANOCOBALAMIN 1000 MCG/ML IJ SOLN
1000.0000 ug | Freq: Once | INTRAMUSCULAR | Status: AC
  Administered 2019-03-30: 10:00:00 1000 ug via INTRAMUSCULAR

## 2019-03-30 NOTE — Progress Notes (Signed)
Patient presented today for B12 injection.  Administered IM in right deltoid.  Patient tolerated well with no signs of distress.   

## 2019-03-31 ENCOUNTER — Other Ambulatory Visit: Payer: Self-pay | Admitting: Oncology

## 2019-03-31 ENCOUNTER — Other Ambulatory Visit: Payer: Self-pay | Admitting: Internal Medicine

## 2019-04-29 ENCOUNTER — Other Ambulatory Visit: Payer: Self-pay | Admitting: Internal Medicine

## 2019-04-29 ENCOUNTER — Other Ambulatory Visit: Payer: Self-pay | Admitting: Oncology

## 2019-04-30 ENCOUNTER — Other Ambulatory Visit: Payer: Self-pay

## 2019-05-04 ENCOUNTER — Other Ambulatory Visit: Payer: Self-pay

## 2019-05-04 ENCOUNTER — Encounter: Payer: Self-pay | Admitting: Internal Medicine

## 2019-05-04 ENCOUNTER — Ambulatory Visit (INDEPENDENT_AMBULATORY_CARE_PROVIDER_SITE_OTHER): Payer: Medicare Other | Admitting: Internal Medicine

## 2019-05-04 VITALS — BP 160/84 | HR 75 | Temp 96.6°F | Resp 15 | Ht 62.0 in | Wt 145.2 lb

## 2019-05-04 DIAGNOSIS — D61818 Other pancytopenia: Secondary | ICD-10-CM

## 2019-05-04 DIAGNOSIS — E785 Hyperlipidemia, unspecified: Secondary | ICD-10-CM

## 2019-05-04 DIAGNOSIS — Z85528 Personal history of other malignant neoplasm of kidney: Secondary | ICD-10-CM

## 2019-05-04 DIAGNOSIS — Z23 Encounter for immunization: Secondary | ICD-10-CM | POA: Diagnosis not present

## 2019-05-04 DIAGNOSIS — I10 Essential (primary) hypertension: Secondary | ICD-10-CM | POA: Diagnosis not present

## 2019-05-04 DIAGNOSIS — Z1231 Encounter for screening mammogram for malignant neoplasm of breast: Secondary | ICD-10-CM

## 2019-05-04 DIAGNOSIS — E538 Deficiency of other specified B group vitamins: Secondary | ICD-10-CM

## 2019-05-04 DIAGNOSIS — J9611 Chronic respiratory failure with hypoxia: Secondary | ICD-10-CM

## 2019-05-04 DIAGNOSIS — F322 Major depressive disorder, single episode, severe without psychotic features: Secondary | ICD-10-CM | POA: Diagnosis not present

## 2019-05-04 DIAGNOSIS — I70219 Atherosclerosis of native arteries of extremities with intermittent claudication, unspecified extremity: Secondary | ICD-10-CM

## 2019-05-04 DIAGNOSIS — E890 Postprocedural hypothyroidism: Secondary | ICD-10-CM

## 2019-05-04 LAB — COMPREHENSIVE METABOLIC PANEL
ALT: 16 U/L (ref 0–35)
AST: 28 U/L (ref 0–37)
Albumin: 4 g/dL (ref 3.5–5.2)
Alkaline Phosphatase: 77 U/L (ref 39–117)
BUN: 8 mg/dL (ref 6–23)
CO2: 27 mEq/L (ref 19–32)
Calcium: 9.5 mg/dL (ref 8.4–10.5)
Chloride: 103 mEq/L (ref 96–112)
Creatinine, Ser: 0.63 mg/dL (ref 0.40–1.20)
GFR: 110.64 mL/min (ref >60.00)
Glucose, Bld: 80 mg/dL (ref 70–99)
Potassium: 3.7 mEq/L (ref 3.5–5.1)
Sodium: 139 mEq/L (ref 135–145)
Total Bilirubin: 0.5 mg/dL (ref 0.2–1.2)
Total Protein: 7.9 g/dL (ref 6.0–8.3)

## 2019-05-04 LAB — CBC WITH DIFFERENTIAL/PLATELET
Basophils Absolute: 0 10*3/uL (ref 0.0–0.1)
Basophils Relative: 0.8 % (ref 0.0–3.0)
Eosinophils Absolute: 0 10*3/uL (ref 0.0–0.7)
Eosinophils Relative: 0.5 % (ref 0.0–5.0)
HCT: 29.1 % — ABNORMAL LOW (ref 36.0–46.0)
Hemoglobin: 9.4 g/dL — ABNORMAL LOW (ref 12.0–15.0)
Lymphocytes Relative: 30.1 % (ref 12.0–46.0)
Lymphs Abs: 0.9 10*3/uL (ref 0.7–4.0)
MCHC: 32.4 g/dL (ref 30.0–36.0)
MCV: 88.9 fl (ref 78.0–100.0)
Monocytes Absolute: 0.9 10*3/uL (ref 0.1–1.0)
Monocytes Relative: 31 % — ABNORMAL HIGH (ref 3.0–12.0)
Neutro Abs: 1.1 10*3/uL — ABNORMAL LOW (ref 1.4–7.7)
Neutrophils Relative %: 37.6 % — ABNORMAL LOW (ref 43.0–77.0)
Platelets: 61 10*3/uL — ABNORMAL LOW (ref 150.0–400.0)
RBC: 3.27 Mil/uL — ABNORMAL LOW (ref 3.87–5.11)
RDW: 19.4 % — ABNORMAL HIGH (ref 11.5–15.5)
WBC: 3 10*3/uL — ABNORMAL LOW (ref 4.0–10.5)

## 2019-05-04 LAB — LIPID PANEL
Cholesterol: 122 mg/dL (ref 0–200)
HDL: 40.6 mg/dL (ref >39.00)
LDL Cholesterol: 62 mg/dL (ref 0–99)
NonHDL: 81
Total CHOL/HDL Ratio: 3
Triglycerides: 93 mg/dL (ref 0.0–149.0)
VLDL: 18.6 mg/dL (ref 0.0–40.0)

## 2019-05-04 LAB — TSH: TSH: 0.34 u[IU]/mL — ABNORMAL LOW (ref 0.35–4.50)

## 2019-05-04 MED ORDER — CYANOCOBALAMIN 1000 MCG/ML IJ SOLN
1000.0000 ug | Freq: Once | INTRAMUSCULAR | Status: AC
  Administered 2019-05-04: 14:00:00 1000 ug via INTRAMUSCULAR

## 2019-05-04 MED ORDER — TRAZODONE HCL 50 MG PO TABS: 25.0000 mg | ORAL_TABLET | Freq: Every evening | ORAL | 3 refills | Status: DC | PRN

## 2019-05-04 NOTE — Assessment & Plan Note (Signed)
Improved symptomatology.  She has now gained weight (12 lbs) and is ready to reduce medication.  Tapering remeron to off.  Continue Paxil

## 2019-05-04 NOTE — Patient Instructions (Addendum)
You have gained 12 lbs  Since your last visit !!!!!   We are going to to stop  The mirtazapine (because it stimulates appetite)  GRADUALLY AS FOLLOWS:  Reduce  mirtazapine to 1/2 tablet nightly for one week,   Then /12 tablet every other night for one week,  Then stop   Start walking daily FOR EXERCISE  .Marland Kitchen  Increase gradually until you can do 30 minutes  easily .  For your insomnia:  Trial of trazodone to help you sleep ,  One hour before bedtime ,  Increase dose of needed up to 100 mg per night (start at 25 mg )   For your hypertension:  We are increasing  losartan to 100 mg and I want you to start taking it  at bedtime .   Tonight take only 50 mg  Stop the morning dose.  Tomorrow take 100 mg losartan in the evening   Continue carvedilol twice daily at current dose   If bp gets low,  Let me know  :    anything under  110/70 is too low)

## 2019-05-04 NOTE — Assessment & Plan Note (Signed)
With nocturnal hypoxemia requiring use of oxygen

## 2019-05-04 NOTE — Assessment & Plan Note (Signed)
Elevated today,  Well controlled on current regimen based on home readings and review of readings from  other office visits. Renal function stable, no changes today.

## 2019-05-04 NOTE — Progress Notes (Signed)
Subjective:  Patient ID: Caroline Nichols, female    DOB: 06-Nov-1940  Age: 78 y.o. MRN: [id number]  CC: The primary encounter diagnosis was Encounter for screening mammogram for malignant neoplasm of breast. Diagnoses of Hyperlipidemia, unspecified hyperlipidemia type, Acquired pancytopenia ([place]), Postablative hypothyroidism, Essential hypertension, B12 deficiency, Need for immunization against influenza, History of renal cell carcinoma, Chronic respiratory failure with hypoxia ([place]), and Current severe episode of major depressive disorder without psychotic features, unspecified whether recurrent ([place]) were also pertinent to this visit.  HPI Caroline Nichols presents for FOLLOW UP ON DEPRESSION, HYPERTENSION   Renal cell CA:   Has been released from Urology follow up for history of renal cell CA s/p partial right  nephrectomy 2017    neuroendocrine CA of lung (2nd primary) :  Has completed chemotherapy and XRT with CT June 2020 negative for for recurrence   Major depressive disorder:  Symptoms resolved,  She has improved  appetite and has gained 12 lbs during the pandemic due to food choices and lack of exercise.   eating [employer] bars  And oatmeal daily. Discussed stopping remeron and starting a walking program .  She has already started  15 minutes daily since Saturday   Insomnia:  Improved with remeron.  Discussed stopped med due to appetite effects ;  Needs alternerative sleep medication  HTN: Patient is taking her carvedilol and losartan as prescribed and notes no adverse effects.  Home BP readings have been done about once per week and are  generally < 140/80 .  She is avoiding added salt in her diet .   Outpatient Medications Prior to Visit  Medication Sig Dispense Refill   acetaminophen (TYLENOL) 325 MG tablet Take 2 tablets (650 mg total) by mouth every 6 (six) hours as needed for pain. (Patient taking differently: Take 650 mg by mouth every 6 (six) hours as needed for pain. )      albuterol (PROVENTIL HFA;VENTOLIN HFA) 108 (90 Base) MCG/ACT inhaler Inhale 2 puffs into the lungs every 6 (six) hours as needed for wheezing or shortness of breath. 1 Inhaler 2   ALPRAZolam (XANAX) 0.25 MG tablet TAKE ONE TABLET BY MOUTH TWICE A DAY AS NEEDED FOR ANXIETY 60 tablet 3   brimonidine (ALPHAGAN) 0.2 % ophthalmic solution Place 1 drop into both eyes 2 (two) times daily.     Calcium Carbonate-Vitamin D (CALCIUM-CARB 600 + D) 600-125 MG-UNIT TABS Take 2 tablets by mouth daily.     carvedilol (COREG) 6.25 MG tablet TAKE ONE TABLET BY MOUTH TWICE A DAY 180 tablet 1   cholecalciferol (VITAMIN D) 1000 units tablet Take 1,000 Units by mouth daily.     dorzolamide (TRUSOPT) 2 % ophthalmic solution Place 1 drop into both eyes 2 (two) times daily.      ferrous sulfate 325 (65 FE) MG tablet Take 325 mg by mouth daily with breakfast.     furosemide (LASIX) 20 MG tablet Take 0.5 tablets (10 mg total) by mouth daily. 45 tablet 4   gabapentin (NEURONTIN) 300 MG capsule TAKE ONE CAPSULE BY MOUTH THREE TIMES A DAY AS NEEDED FOR SHINGLES PAIN 90 capsule 0   hydroxychloroquine (PLAQUENIL) 200 MG tablet Take 400 mg by mouth daily.      latanoprost (XALATAN) 0.005 % ophthalmic solution Place 1 drop into both eyes at bedtime.      levothyroxine (SYNTHROID) 112 MCG tablet TAKE ONE TABLET BY MOUTH EVERY MORNING 90 tablet 1   losartan (COZAAR) 50 MG tablet TAKE  ONE TABLET BY MOUTH DAILY 90 tablet 1   mirtazapine (REMERON) 15 MG tablet TAKE ONE TABLET BY MOUTH EVERY NIGHT AT BEDTIME 30 tablet 2   MYRBETRIQ 50 MG TB24 tablet TAKE ONE TABLET BY MOUTH DAILY 30 tablet 4   nitroGLYCERIN (NITROLINGUAL) 0.4 MG/SPRAY spray Place 1 spray under the tongue every 5 (five) minutes x 3 doses as needed for chest pain. 4.9 g 3   Omega-3 Fatty Acids (FISH OIL) 1000 MG CAPS Take 1,000 mg by mouth daily.      pantoprazole (PROTONIX) 40 MG tablet TAKE ONE TABLET BY MOUTH DAILY 90 tablet 1   PARoxetine (PAXIL) 10  MG tablet TAKE ONE TABLET BY MOUTH DAILY AFTER DINNER 90 tablet 1   timolol (TIMOPTIC) 0.5 % ophthalmic solution Place 1 drop into both eyes 2 (two) times daily.      vitamin C (ASCORBIC ACID) 500 MG tablet Take 500 mg by mouth daily.     No facility-administered medications prior to visit.     Review of Systems;  Patient denies headache, fevers, malaise, unintentional weight loss, skin rash, eye pain, sinus congestion and sinus pain, sore throat, dysphagia,  hemoptysis , cough, dyspnea, wheezing, chest pain, palpitations, orthopnea, edema, abdominal pain, nausea, melena, diarrhea, constipation, flank pain, dysuria, hematuria, urinary  Frequency, nocturia, numbness, tingling, seizures,  Focal weakness, Loss of consciousness,  Tremor, insomnia, depression, anxiety, and suicidal ideation.      Objective:  BP (!) 160/84 (BP Location: Left Arm, Patient Position: Sitting, Cuff Size: Normal)    Pulse 75    Temp (!) 96.6 F (35.9 C) (Temporal)    Resp 15    Ht 5\' 2"  (1.575 m)    Wt 145 lb 3.2 oz (65.9 kg)    SpO2 94%    BMI 26.56 kg/m   BP Readings from Last 3 Encounters:  05/04/19 (!) 160/84  01/20/19 (!) 193/85  12/11/18 136/70    Wt Readings from Last 3 Encounters:  05/04/19 145 lb 3.2 oz (65.9 kg)  12/11/18 133 lb (60.3 kg)  09/18/18 135 lb 12.8 oz (61.6 kg)    General appearance: alert, cooperative and appears stated age Ears: normal TM's and external ear canals both ears Throat: lips, mucosa, and tongue normal; teeth and gums normal Neck: no adenopathy, no carotid bruit, supple, symmetrical, trachea midline and thyroid not enlarged, symmetric, no tenderness/mass/nodules Back: symmetric, no curvature. ROM normal. No CVA tenderness. Lungs: clear to auscultation bilaterally Heart: regular rate and rhythm, S1, S2 normal, no murmur, click, rub or gallop Abdomen: soft, non-tender; bowel sounds normal; no masses,  no organomegaly Pulses: 2+ and symmetric Skin: Skin color, texture,  turgor normal. No rashes or lesions Lymph nodes: Cervical, supraclavicular, and axillary nodes normal.  Lab Results  Component Value Date   HGBA1C 5.2 01/09/2018   HGBA1C 5.3 06/09/2014   HGBA1C 5.2 12/27/2012    Lab Results  Component Value Date   CREATININE 0.63 05/04/2019   CREATININE 0.88 01/12/2019   CREATININE 0.81 08/21/2018    Lab Results  Component Value Date   WBC 3.0 (L) 05/04/2019   HGB 9.4 (L) 05/04/2019   HCT 29.1 (L) 05/04/2019   PLT 61.0 (L) 05/04/2019   GLUCOSE 80 05/04/2019   CHOL 122 05/04/2019   TRIG 93.0 05/04/2019   HDL 40.60 05/04/2019   LDLCALC 62 05/04/2019   ALT 16 05/04/2019   AST 28 05/04/2019   NA 139 05/04/2019   K 3.7 05/04/2019   CL 103 05/04/2019  CREATININE 0.63 05/04/2019   BUN 8 05/04/2019   CO2 27 05/04/2019   TSH 0.34 (L) 05/04/2019   INR 1.20 12/19/2017   HGBA1C 5.2 01/09/2018   MICROALBUR 0.7 06/09/2014    Ct Chest W Contrast  Result Date: 01/13/2019 CLINICAL DATA:  Lung cancer. Radiation therapy 2 stage IIB large-cell neuroendocrine tumor of the LEFT lung. EXAM: CT CHEST, ABDOMEN, AND PELVIS WITH CONTRAST TECHNIQUE: Multidetector CT imaging of the chest, abdomen and pelvis was performed following the standard protocol during bolus administration of intravenous contrast. CONTRAST:  20mL OMNIPAQUE IOHEXOL 300 MG/ML  SOLN COMPARISON:  CT 07/13/2018 FINDINGS: CT CHEST FINDINGS Cardiovascular: Port in the anterior chest wall with tip in distal SVC. No significant vascular findings. Normal heart size. No pericardial effusion. Mediastinum/Nodes: No axillary supraclavicular nodes. No mediastinal hilar nodes. No pericardial fluid. Esophagus normal. Lungs/Pleura: Centrilobular emphysema the upper lobes. Band of perihilar parenchymal thickening extending from the LEFT infrahilar region is unchanged from comparison exam (image 91/4). No new nodularity. Musculoskeletal: No aggressive osseous lesion. Moped healed 6 lateral LEFT rib fracture.  More recent lateral seventh rib fracture (image 86/3) CT ABDOMEN AND PELVIS FINDINGS Hepatobiliary: No focal hepatic lesion. No biliary ductal dilatation. Gallbladder is normal. Common bile duct is normal. Pancreas: Pancreas is normal. No ductal dilatation. No pancreatic inflammation. Spleen: Normal spleen Adrenals/urinary tract: Adrenal glands normal. Nonobstructing calculus lower pole of the RIGHT kidney. Simple fluid attenuation cyst of the RIGHT kidney. Ureters and bladder normal. Stomach/Bowel: Stomach, small bowel, appendix, and cecum are normal. The colon and rectosigmoid colon are normal. Vascular/Lymphatic: Abdominal aorta is normal caliber with atherosclerotic calcification. There is no retroperitoneal or periportal lymphadenopathy. No pelvic lymphadenopathy. Reproductive: Post hysterectomy. Other: No free fluid. Musculoskeletal: No aggressive osseous lesion. Multiple levels of endplate sclerosis. IMPRESSION: Chest Impression: 1. Stable LEFT infrahilar post radiation change. 2. No evidence of lung cancer recurrence in thorax. 3. Centrilobular emphysema in the upper lobes. 4. Interval LEFT lateral rib fracture compared to CT 07/13/2018. Abdomen / Pelvis Impression: 1. No evidence of metastatic disease in the abdomen pelvis 2. Degenerate changes of the spine. No evidence of skeletal metastasis Electronically Signed   By: Suzy Bouchard M.D.   On: 01/13/2019 11:32   Ct Abdomen Pelvis W Contrast  Result Date: 01/13/2019 CLINICAL DATA:  Lung cancer. Radiation therapy 2 stage IIB large-cell neuroendocrine tumor of the LEFT lung. EXAM: CT CHEST, ABDOMEN, AND PELVIS WITH CONTRAST TECHNIQUE: Multidetector CT imaging of the chest, abdomen and pelvis was performed following the standard protocol during bolus administration of intravenous contrast. CONTRAST:  30mL OMNIPAQUE IOHEXOL 300 MG/ML  SOLN COMPARISON:  CT 07/13/2018 FINDINGS: CT CHEST FINDINGS Cardiovascular: Port in the anterior chest wall with tip in  distal SVC. No significant vascular findings. Normal heart size. No pericardial effusion. Mediastinum/Nodes: No axillary supraclavicular nodes. No mediastinal hilar nodes. No pericardial fluid. Esophagus normal. Lungs/Pleura: Centrilobular emphysema the upper lobes. Band of perihilar parenchymal thickening extending from the LEFT infrahilar region is unchanged from comparison exam (image 91/4). No new nodularity. Musculoskeletal: No aggressive osseous lesion. Moped healed 6 lateral LEFT rib fracture. More recent lateral seventh rib fracture (image 86/3) CT ABDOMEN AND PELVIS FINDINGS Hepatobiliary: No focal hepatic lesion. No biliary ductal dilatation. Gallbladder is normal. Common bile duct is normal. Pancreas: Pancreas is normal. No ductal dilatation. No pancreatic inflammation. Spleen: Normal spleen Adrenals/urinary tract: Adrenal glands normal. Nonobstructing calculus lower pole of the RIGHT kidney. Simple fluid attenuation cyst of the RIGHT kidney. Ureters and bladder normal. Stomach/Bowel:  Stomach, small bowel, appendix, and cecum are normal. The colon and rectosigmoid colon are normal. Vascular/Lymphatic: Abdominal aorta is normal caliber with atherosclerotic calcification. There is no retroperitoneal or periportal lymphadenopathy. No pelvic lymphadenopathy. Reproductive: Post hysterectomy. Other: No free fluid. Musculoskeletal: No aggressive osseous lesion. Multiple levels of endplate sclerosis. IMPRESSION: Chest Impression: 1. Stable LEFT infrahilar post radiation change. 2. No evidence of lung cancer recurrence in thorax. 3. Centrilobular emphysema in the upper lobes. 4. Interval LEFT lateral rib fracture compared to CT 07/13/2018. Abdomen / Pelvis Impression: 1. No evidence of metastatic disease in the abdomen pelvis 2. Degenerate changes of the spine. No evidence of skeletal metastasis Electronically Signed   By: Suzy Bouchard M.D.   On: 01/13/2019 11:32    Assessment & Plan:   Problem List Items  Addressed This Visit      Unprioritized   Hyperlipidemia   Relevant Orders   Lipid panel (Completed)   Postablative hypothyroidism   Relevant Orders   TSH (Completed)   Acquired pancytopenia ([place])   Relevant Orders   CBC with Differential/Platelet (Completed)   B12 deficiency   Essential hypertension    Elevated today,  Well controlled on current regimen based on home readings and review of readings from  other office visits. Renal function stable, no changes today.      Relevant Orders   Comprehensive metabolic panel (Completed)   Major depressive disorder with current active episode    Improved symptomatology.  She has now gained weight (12 lbs) and is ready to reduce medication.  Tapering remeron to off.  Continue Paxil       Relevant Medications   traZODone (DESYREL) 50 MG tablet   History of renal cell carcinoma    S/p left partial nephrectomy in 2017.  She has been released by Urology       Chronic respiratory failure ([place])    With nocturnal hypoxemia requiring use of oxygen        Other Visit Diagnoses    Encounter for screening mammogram for malignant neoplasm of breast    -  Primary   Relevant Orders   MM 3D SCREEN BREAST BILATERAL   Need for immunization against influenza       Relevant Orders   Flu Vaccine QUAD High Dose(Fluad) (Completed)    A total of 25 minutes of face to face time was spent with patient more than half of which was spent in counselling about the above mentioned conditions  and coordination of care   I am having Caroline Nichols start on traZODone. I am also having her maintain her Fish Oil, acetaminophen, vitamin C, ferrous sulfate, brimonidine, hydroxychloroquine, latanoprost, dorzolamide, timolol, cholecalciferol, Calcium Carbonate-Vitamin D, albuterol, furosemide, carvedilol, nitroGLYCERIN, levothyroxine, ALPRAZolam, losartan, Myrbetriq, pantoprazole, PARoxetine, gabapentin, and mirtazapine. We administered cyanocobalamin.  Meds ordered  this encounter  Medications   traZODone (DESYREL) 50 MG tablet    Sig: Take 0.5-1 tablets (25-50 mg total) by mouth at bedtime as needed for sleep.    Dispense:  90 tablet    Refill:  3   cyanocobalamin ((VITAMIN B-12)) injection 1,000 mcg    There are no discontinued medications.  Follow-up: No follow-ups on file.   [name], MD

## 2019-05-04 NOTE — Assessment & Plan Note (Signed)
S/p left partial nephrectomy in 2017.  She has been released by Urology

## 2019-05-06 ENCOUNTER — Other Ambulatory Visit: Payer: Self-pay | Admitting: Internal Medicine

## 2019-05-06 MED ORDER — LEVOTHYROXINE SODIUM 100 MCG PO TABS: 100.0000 ug | ORAL_TABLET | Freq: Every morning | ORAL | 0 refills | Status: DC

## 2019-05-07 ENCOUNTER — Encounter: Payer: Self-pay | Admitting: Internal Medicine

## 2019-05-10 ENCOUNTER — Other Ambulatory Visit: Payer: Self-pay

## 2019-05-11 ENCOUNTER — Inpatient Hospital Stay: Payer: Medicare Other | Attending: Oncology

## 2019-05-11 ENCOUNTER — Other Ambulatory Visit: Payer: Self-pay

## 2019-05-11 DIAGNOSIS — Z95828 Presence of other vascular implants and grafts: Secondary | ICD-10-CM

## 2019-05-11 DIAGNOSIS — Z452 Encounter for adjustment and management of vascular access device: Secondary | ICD-10-CM | POA: Diagnosis not present

## 2019-05-11 DIAGNOSIS — C7A8 Other malignant neuroendocrine tumors: Secondary | ICD-10-CM | POA: Diagnosis not present

## 2019-05-11 DIAGNOSIS — C641 Malignant neoplasm of right kidney, except renal pelvis: Secondary | ICD-10-CM | POA: Diagnosis present

## 2019-05-11 MED ORDER — SODIUM CHLORIDE 0.9% FLUSH
10.0000 mL | Freq: Once | INTRAVENOUS | Status: AC
  Administered 2019-05-11: 10 mL via INTRAVENOUS
  Filled 2019-05-11: qty 10

## 2019-05-11 MED ORDER — HEPARIN SOD (PORK) LOCK FLUSH 100 UNIT/ML IV SOLN
500.0000 [IU] | Freq: Once | INTRAVENOUS | Status: AC
  Administered 2019-05-11: 500 [IU] via INTRAVENOUS

## 2019-05-13 ENCOUNTER — Other Ambulatory Visit: Payer: Self-pay | Admitting: Internal Medicine

## 2019-05-19 ENCOUNTER — Other Ambulatory Visit: Payer: Self-pay | Admitting: Internal Medicine

## 2019-05-21 ENCOUNTER — Other Ambulatory Visit: Payer: Self-pay | Admitting: Internal Medicine

## 2019-05-24 DIAGNOSIS — H401133 Primary open-angle glaucoma, bilateral, severe stage: Secondary | ICD-10-CM | POA: Diagnosis not present

## 2019-05-24 DIAGNOSIS — H2513 Age-related nuclear cataract, bilateral: Secondary | ICD-10-CM | POA: Diagnosis not present

## 2019-05-28 ENCOUNTER — Ambulatory Visit (INDEPENDENT_AMBULATORY_CARE_PROVIDER_SITE_OTHER): Payer: Medicare Other

## 2019-05-28 ENCOUNTER — Encounter (INDEPENDENT_AMBULATORY_CARE_PROVIDER_SITE_OTHER): Payer: Self-pay | Admitting: Nurse Practitioner

## 2019-05-28 ENCOUNTER — Ambulatory Visit (INDEPENDENT_AMBULATORY_CARE_PROVIDER_SITE_OTHER): Payer: Medicare Other | Admitting: Nurse Practitioner

## 2019-05-28 ENCOUNTER — Other Ambulatory Visit: Payer: Self-pay

## 2019-05-28 VITALS — BP 144/73 | HR 72 | Resp 16 | Wt 142.6 lb

## 2019-05-28 DIAGNOSIS — I70219 Atherosclerosis of native arteries of extremities with intermittent claudication, unspecified extremity: Secondary | ICD-10-CM | POA: Diagnosis not present

## 2019-05-28 DIAGNOSIS — K2100 Gastro-esophageal reflux disease with esophagitis, without bleeding: Secondary | ICD-10-CM | POA: Diagnosis not present

## 2019-05-28 DIAGNOSIS — I10 Essential (primary) hypertension: Secondary | ICD-10-CM

## 2019-06-01 ENCOUNTER — Encounter (INDEPENDENT_AMBULATORY_CARE_PROVIDER_SITE_OTHER): Payer: Self-pay | Admitting: Nurse Practitioner

## 2019-06-01 NOTE — Progress Notes (Signed)
SUBJECTIVE:  Patient ID: Caroline Nichols, female    DOB: 08-Nov-1940, 78 y.o.   MRN: [id number] Chief Complaint  Patient presents with  . Follow-up    ultrasound follow up    HPI  Caroline Nichols is a 78 y.o. female The patient returns to the office for followup and review of the noninvasive studies. There have been an increase in lower extremity symptoms.  There is some interval shortening of the patient's claudication distance but no development of rest pain symptoms. No new ulcers or wounds have occurred since the last visit.  There have been no significant changes to the patient's overall health care.  The patient denies amaurosis fugax or recent TIA symptoms. There are no recent neurological changes noted. The patient denies history of DVT, PE or superficial thrombophlebitis. The patient denies recent episodes of angina or shortness of breath.   ABI Rt=0.81 and Lt=0.85  (previous ABI's Rt=0.96 and Lt=1.13) Duplex ultrasound of the right lower extremity reveals monophasic waveforms in the anterior tibial artery with biphasic waveforms in the posterior tibial artery and peroneal artery.  The left lower extremity has biphasic waveforms within the anterior tibial artery and monophasic waveforms within the posterior tibial and peroneal artery.  The toe waveforms are good bilaterally. Past Medical History:  Diagnosis Date  . Anemia 02/2013   F/U with Dr. Orlie Dakin for Feraheme injections  . Anxiety   . Arthritis    Possible RA - Follow up appt with Dr. Gavin Potters  . Cellulitis of arm, right   . Chest pain 03/26/2013  . Diabetes insipidus (HCC)    On desmopressin  . GERD (gastroesophageal reflux disease)   . H/O seasonal allergies   . H/O transfusion of packed red blood cells 02/2013   3 units PRBC for severe anemia 02/2013  . Herniated disc   . History of kidney stones   . Hyperlipidemia   . Hypertension   . Hyperthyroidism    s/p radiation therapy, now hypothyroidism  . IBS (irritable  bowel syndrome)   . Lung cancer (HCC) 06/2017   left  . Migraine   . Pacemaker    a. s/p successful implantation of a Medtronic CapSureFix Novus MRIT SureScan serial # [id number] on 06/28/16 by Dr. Graciela Husbands for sinus node dysfunction.  . Pancreatic cyst   . Paroxysmal A-fib (HCC)   . Renal cell cancer, right (HCC) 10/23/2015   Right partial nephrectomy.    Past Surgical History:  Procedure Laterality Date  . ABDOMINAL HYSTERECTOMY    . BREAST EXCISIONAL BIOPSY Right 2005   neg  . BREAST SURGERY  1990s   Biopsy  . CERVICAL SPINE SURGERY     Bone fusion  . COLON[place]  2008?   [place]  . COLONOSCOPY WITH PROPOFOL N/A 10/15/2016   Procedure: COLONOSCOPY WITH PROPOFOL;  Surgeon: Earline Mayotte, MD;  Location: [facility] ENDOSCOPY;  Service: Endoscopy;  Laterality: N/A;  . EP IMPLANTABLE DEVICE N/A 06/28/2016   Procedure: Pacemaker Implant;  Surgeon: Duke Salvia, MD;  Location: [facility] INVASIVE CV LAB;  Service: Cardiovascular;  Laterality: N/A;  . HAMMER TOE SURGERY    . HERNIA REPAIR Left    Inguinal Hernia Repair  . PORTA CATH INSERTION N/A 07/24/2017   Procedure: PORTA CATH INSERTION;  Surgeon: Annice Needy, MD;  Location: [facility] INVASIVE CV LAB;  Service: Cardiovascular;  Laterality: N/A;  . ROBOTIC ASSITED PARTIAL NEPHRECTOMY Right 10/23/2015   Procedure: ROBOTIC ASSITED PARTIAL NEPHRECTOMY with intraop ultrasound;  Surgeon: Morrie Sheldon  Apolinar Junes, MD;  Location: [facility] ORS;  Service: Urology;  Laterality: Right;  . TUBAL LIGATION      Social History   Socioeconomic History  . Marital status: Widowed    Spouse name: Not on file  . Number of children: 4  . Years of education: 61  . Highest education level: Not on file  Occupational History  . Occupation: Designer, fashion/clothing    Comment: Retired  . Occupation: Camera operator and [employer]    Comment: Retired  Engineer, production  . Financial resource strain: Not on file  . Food insecurity    Worry: Not on file    Inability: Not on file  .  Transportation needs    Medical: Not on file    Non-medical: Not on file  Tobacco Use  . Smoking status: Former Smoker    Packs/day: 0.50    Years: 30.00    Pack years: 15.00    Types: Cigarettes    Quit date: 12/03/2012    Years since quitting: 6.4  . Smokeless tobacco: Never Used  Substance and Sexual Activity  . Alcohol use: No    Alcohol/week: 0.0 standard drinks  . Drug use: No  . Sexual activity: Never  Lifestyle  . Physical activity    Days per week: Not on file    Minutes per session: Not on file  . Stress: Not on file  Relationships  . Social Musician on phone: Not on file    Gets together: Not on file    Attends religious service: Not on file    Active member of club or organization: Not on file    Attends meetings of clubs or organizations: Not on file    Relationship status: Not on file  . Intimate partner violence    Fear of current or ex partner: Not on file    Emotionally abused: Not on file    Physically abused: Not on file    Forced sexual activity: Not on file  Other Topics Concern  . Not on file  Social History Narrative   Ms. Nishihara was born and reared in [place]. She is a widow since 37. She was married for 48 years. They had 4 children (daughters). She is currently leaving at [facility] since June. 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 She enjoys shopping. She also enjoys music, word puzzles and she loves spending time with her family. She loves attending church. One of her favorite things is wearing hats. She absolutely loves hats!    Family History  Problem Relation Age of Onset  . Heart disease Mother   . Heart disease Father   . Diabetes Brother   . Kidney disease Brother   . Stroke Daughter        due to medication reaction  . Breast cancer Cousin   . Prostate cancer Neg Hx   . Hematuria Neg Hx     Allergies  Allergen Reactions  .  Metrizamide     Other reaction(s): Other (See Comments)  . Adhesive [Tape] Other (See Comments)    "irritate skin"  . Augmentin [Amoxicillin-Pot Clavulanate] Diarrhea    Severe diarrhea  . Codeine     REACTION: NAUSEA  . Morphine And Related Other (See Comments)    Shut down her organs  . Other     Skin irritation  . Tetanus Toxoid  REACTION: ARM SWELLING,REDNESS  . Tramadol Nausea And Vomiting     Review of Systems   Review of Systems: Negative Unless Checked Constitutional: [] Weight loss  [] Fever  [] Chills Cardiac: [] Chest pain   []  Atrial Fibrillation  [] Palpitations   [] Shortness of breath when laying flat   [] Shortness of breath with exertion. [] Shortness of breath at rest Vascular:  [x] Pain in legs with walking   [] Pain in legs with standing [] Pain in legs when laying flat   [x] Claudication    [] Pain in feet when laying flat    [] History of DVT   [] Phlebitis   [] Swelling in legs   [] Varicose veins   [] Non-healing ulcers Pulmonary:   [] Uses home oxygen   [] Productive cough   [] Hemoptysis   [] Wheeze  [] COPD   [] Asthma Neurologic:  [] Dizziness   [] Seizures  [] Blackouts [] History of stroke   [] History of TIA  [] Aphasia   [] Temporary Blindness   [] Weakness or numbness in arm   [x] Weakness or numbness in leg Musculoskeletal:   [] Joint swelling   [] Joint pain   [] Low back pain  []  History of Knee Replacement [x] Arthritis [] back Surgeries  []  Spinal Stenosis    Hematologic:  [] Easy bruising  [] Easy bleeding   [] Hypercoagulable state   [] Anemic Gastrointestinal:  [] Diarrhea   [] Vomiting  [x] Gastroesophageal reflux/heartburn   [] Difficulty swallowing. [] Abdominal pain Genitourinary:  [] Chronic kidney disease   [] Difficult urination  [] Anuric   [] Blood in urine [] Frequent urination  [] Burning with urination   [] Hematuria Skin:  [] Rashes   [] Ulcers [] Wounds Psychological:  [] History of anxiety   []  History of major depression  []  Memory Difficulties      OBJECTIVE:   Physical  Exam  BP (!) 144/73 (BP Location: Right Arm)   Pulse 72   Resp 16   Wt 142 lb 9.6 oz (64.7 kg)   BMI 26.08 kg/m   Gen: WD/WN, NAD Head: [place]/AT, No temporalis wasting.  Ear/Nose/Throat: Hearing grossly intact, nares w/o erythema or drainage Eyes: PER, EOMI, sclera nonicteric.  Neck: Supple, no masses.  No JVD.  Pulmonary:  Good air movement, no use of accessory muscles.  Cardiac: RRR Vascular:  Vessel Right Left  Radial Palpable Palpable  Dorsalis Pedis Not Palpable Not Palpable  Posterior Tibial Not Palpable Not Palpable   Gastrointestinal: soft, non-distended. No guarding/no peritoneal signs.  Musculoskeletal: M/S 5/5 throughout.  No deformity or atrophy.  Neurologic: Pain and light touch intact in extremities.  Symmetrical.  Speech is fluent. Motor exam as listed above. Psychiatric: Judgment intact, Mood & affect appropriate for pt's clinical situation. Dermatologic: No Venous rashes. No Ulcers Noted.  No changes consistent with cellulitis. Lymph : No Cervical lymphadenopathy, no lichenification or skin changes of chronic lymphedema.       ASSESSMENT AND PLAN:  1. Atherosclerotic peripheral vascular disease with intermittent claudication (HCC) I discussed angiogram with the patient however at this time she is not wish to proceed.  Therefore we will continue with conservative management at this time.   Recommend:  The patient has evidence of atherosclerosis of the lower extremities with claudication.  The patient does not voice lifestyle limiting changes at this point in time.  Noninvasive studies do not suggest clinically significant change.  No invasive studies, angiography or surgery at this time The patient should continue walking and begin a more formal exercise program.  The patient should continue antiplatelet therapy and aggressive treatment of the lipid abnormalities  No changes in the patient's medications at this time  The patient  should continue wearing  graduated compression socks 10-15 mmHg strength to control the mild edema.   Patient will follow-up in 6 months for follow-up studies  - VAS [place] ABI WITH/WO TBI; Future  2. Essential hypertension Continue antihypertensive medications as already ordered, these medications have been reviewed and there are no changes at this time.   3. Gastroesophageal reflux disease with esophagitis without hemorrhage Continue PPI as already ordered, this medication has been reviewed and there are no changes at this time.  Avoidence of caffeine and alcohol  Moderate elevation of the head of the bed    Current Outpatient Medications on File Prior to Visit  Medication Sig Dispense Refill  . acetaminophen (TYLENOL) 325 MG tablet Take 2 tablets (650 mg total) by mouth every 6 (six) hours as needed for pain. (Patient taking differently: Take 650 mg by mouth every 6 (six) hours as needed for pain. )    . albuterol (PROVENTIL HFA;VENTOLIN HFA) 108 (90 Base) MCG/ACT inhaler Inhale 2 puffs into the lungs every 6 (six) hours as needed for wheezing or shortness of breath. 1 Inhaler 2  . ALPRAZolam (XANAX) 0.25 MG tablet TAKE ONE TABLET BY MOUTH TWICE A DAY AS NEEDED FOR ANXIETY 60 tablet 3  . brimonidine (ALPHAGAN) 0.2 % ophthalmic solution Place 1 drop into both eyes 2 (two) times daily.    . Calcium Carbonate-Vitamin D (CALCIUM-CARB 600 + D) 600-125 MG-UNIT TABS Take 2 tablets by mouth daily.    . carvedilol (COREG) 6.25 MG tablet TAKE ONE TABLET BY MOUTH TWICE A DAY 180 tablet 0  . cholecalciferol (VITAMIN D) 1000 units tablet Take 1,000 Units by mouth daily.    . dorzolamide (TRUSOPT) 2 % ophthalmic solution Place 1 drop into both eyes 2 (two) times daily.     . ferrous sulfate 325 (65 FE) MG tablet Take 325 mg by mouth daily with breakfast.    . furosemide (LASIX) 20 MG tablet TAKE ONE-HALF TABLET BY MOUTH DAILY 45 tablet 3  . gabapentin (NEURONTIN) 300 MG capsule TAKE ONE CAPSULE BY MOUTH THREE TIMES A DAY AS  NEEDED FOR SHINGLES PAIN 90 capsule 0  . hydroxychloroquine (PLAQUENIL) 200 MG tablet Take 400 mg by mouth daily.     Marland Kitchen latanoprost (XALATAN) 0.005 % ophthalmic solution Place 1 drop into both eyes at bedtime.     Marland Kitchen levothyroxine (SYNTHROID) 100 MCG tablet Take 1 tablet (100 mcg total) by mouth every morning. 90 tablet 0  . losartan (COZAAR) 50 MG tablet TAKE ONE TABLET BY MOUTH DAILY 90 tablet 1  . mirtazapine (REMERON) 15 MG tablet TAKE ONE TABLET BY MOUTH EVERY NIGHT AT BEDTIME 30 tablet 2  . MYRBETRIQ 50 MG TB24 tablet TAKE ONE TABLET BY MOUTH DAILY 30 tablet 4  . nitroGLYCERIN (NITROLINGUAL) 0.4 MG/SPRAY spray Place 1 spray under the tongue every 5 (five) minutes x 3 doses as needed for chest pain. 4.9 g 3  . Omega-3 Fatty Acids (FISH OIL) 1000 MG CAPS Take 1,000 mg by mouth daily.     . pantoprazole (PROTONIX) 40 MG tablet TAKE ONE TABLET BY MOUTH DAILY 90 tablet 1  . PARoxetine (PAXIL) 10 MG tablet TAKE ONE TABLET BY MOUTH DAILY AFTER DINNER 90 tablet 1  . timolol (TIMOPTIC) 0.5 % ophthalmic solution Place 1 drop into both eyes 2 (two) times daily.     . traZODone (DESYREL) 50 MG tablet Take 0.5-1 tablets (25-50 mg total) by mouth at bedtime as needed for sleep. 90 tablet 3  .  vitamin C (ASCORBIC ACID) 500 MG tablet Take 500 mg by mouth daily.     No current facility-administered medications on file prior to visit.     There are no Patient Instructions on file for this visit. No follow-ups on file.   Georgiana Spinner, NP  This note was completed with Office manager.  Any errors are purely unintentional.

## 2019-06-08 ENCOUNTER — Other Ambulatory Visit: Payer: Self-pay

## 2019-06-08 ENCOUNTER — Ambulatory Visit (INDEPENDENT_AMBULATORY_CARE_PROVIDER_SITE_OTHER): Payer: Medicare Other

## 2019-06-08 DIAGNOSIS — E538 Deficiency of other specified B group vitamins: Secondary | ICD-10-CM

## 2019-06-08 MED ORDER — CYANOCOBALAMIN 1000 MCG/ML IJ SOLN
1000.0000 ug | Freq: Once | INTRAMUSCULAR | Status: AC
  Administered 2019-06-08: 10:00:00 1000 ug via INTRAMUSCULAR

## 2019-06-08 NOTE — Progress Notes (Addendum)
Patient presented today for B12 injection.  Administered IM in left deltoid.  Patient tolerated well with no signs of distress.  Reviewed.  Dr Scott  

## 2019-06-17 ENCOUNTER — Telehealth: Payer: Self-pay | Admitting: Internal Medicine

## 2019-06-17 ENCOUNTER — Ambulatory Visit (INDEPENDENT_AMBULATORY_CARE_PROVIDER_SITE_OTHER): Payer: Medicare Other | Admitting: *Deleted

## 2019-06-17 DIAGNOSIS — E890 Postprocedural hypothyroidism: Secondary | ICD-10-CM

## 2019-06-17 DIAGNOSIS — I495 Sick sinus syndrome: Secondary | ICD-10-CM

## 2019-06-17 DIAGNOSIS — D61818 Other pancytopenia: Secondary | ICD-10-CM

## 2019-06-17 DIAGNOSIS — Z8679 Personal history of other diseases of the circulatory system: Secondary | ICD-10-CM

## 2019-06-17 NOTE — Telephone Encounter (Signed)
Yes CBC and TSH ,  Thanks

## 2019-06-17 NOTE — Telephone Encounter (Signed)
Pt called saying she does not thtink her the changes on her thyroid medications are working together.  She was in Sept and seen Dr. Derrel Nip that is when her medications were changed  Best contact #  [phone]

## 2019-06-17 NOTE — Telephone Encounter (Signed)
Patient says he has been taking the new dose of Levothyroxine 112 mcg since 05/07/19 , But she has continued to feel worse since this change, so patient daughter just realized that there was a question about patient blood count Hgb, HCT last visit, Patient does not see hematology just Dr. Grayland Ormond for HX of cancer. I have a scheduled patient lab for repeat TSH and I have added CBC, this ok. Lab scheduled for 06/18/19?

## 2019-06-18 ENCOUNTER — Other Ambulatory Visit: Payer: Self-pay

## 2019-06-18 ENCOUNTER — Other Ambulatory Visit (INDEPENDENT_AMBULATORY_CARE_PROVIDER_SITE_OTHER): Payer: Medicare Other

## 2019-06-18 ENCOUNTER — Emergency Department: Payer: Medicare Other

## 2019-06-18 ENCOUNTER — Telehealth: Payer: Self-pay | Admitting: Internal Medicine

## 2019-06-18 ENCOUNTER — Emergency Department
Admission: EM | Admit: 2019-06-18 | Discharge: 2019-06-19 | Disposition: A | Discharge status: Home / Self Care | Payer: Medicare Other | Attending: Emergency Medicine | Admitting: Emergency Medicine

## 2019-06-18 ENCOUNTER — Other Ambulatory Visit: Payer: Medicare Other

## 2019-06-18 DIAGNOSIS — I160 Hypertensive urgency: Secondary | ICD-10-CM | POA: Diagnosis not present

## 2019-06-18 DIAGNOSIS — D61818 Other pancytopenia: Secondary | ICD-10-CM | POA: Diagnosis not present

## 2019-06-18 DIAGNOSIS — Z79899 Other long term (current) drug therapy: Secondary | ICD-10-CM | POA: Diagnosis not present

## 2019-06-18 DIAGNOSIS — E039 Hypothyroidism, unspecified: Secondary | ICD-10-CM | POA: Diagnosis not present

## 2019-06-18 DIAGNOSIS — E890 Postprocedural hypothyroidism: Secondary | ICD-10-CM

## 2019-06-18 DIAGNOSIS — I10 Essential (primary) hypertension: Secondary | ICD-10-CM | POA: Insufficient documentation

## 2019-06-18 DIAGNOSIS — D649 Anemia, unspecified: Secondary | ICD-10-CM | POA: Insufficient documentation

## 2019-06-18 DIAGNOSIS — R531 Weakness: Secondary | ICD-10-CM | POA: Diagnosis not present

## 2019-06-18 DIAGNOSIS — K219 Gastro-esophageal reflux disease without esophagitis: Secondary | ICD-10-CM

## 2019-06-18 DIAGNOSIS — Z87891 Personal history of nicotine dependence: Secondary | ICD-10-CM | POA: Insufficient documentation

## 2019-06-18 DIAGNOSIS — D729 Disorder of white blood cells, unspecified: Secondary | ICD-10-CM

## 2019-06-18 DIAGNOSIS — Z85118 Personal history of other malignant neoplasm of bronchus and lung: Secondary | ICD-10-CM | POA: Diagnosis not present

## 2019-06-18 DIAGNOSIS — E232 Diabetes insipidus: Secondary | ICD-10-CM | POA: Diagnosis not present

## 2019-06-18 LAB — URINALYSIS, COMPLETE (UACMP) WITH MICROSCOPIC
Bacteria, UA: NONE SEEN
Bilirubin Urine: NEGATIVE
Glucose, UA: NEGATIVE mg/dL
Ketones, ur: NEGATIVE mg/dL
Leukocytes,Ua: NEGATIVE
Nitrite: NEGATIVE
Protein, ur: NEGATIVE mg/dL
Specific Gravity, Urine: 1.005 (ref 1.005–1.030)
WBC, UA: NONE SEEN WBC/hpf (ref 0–5)
pH: 6 (ref 5.0–8.0)

## 2019-06-18 LAB — RETICULOCYTES
Immature Retic Fract: 23.2 % — ABNORMAL HIGH (ref 2.3–15.9)
RBC.: 2.84 MIL/uL — ABNORMAL LOW (ref 3.87–5.11)
Retic Count, Absolute: 99.7 10*3/uL (ref 19.0–186.0)
Retic Ct Pct: 3.5 % — ABNORMAL HIGH (ref 0.4–3.1)

## 2019-06-18 LAB — CBC WITH DIFFERENTIAL/PLATELET
Abs Immature Granulocytes: 1.34 10*3/uL — ABNORMAL HIGH (ref 0.00–0.07)
Basophils Absolute: 0.1 10*3/uL (ref 0.0–0.1)
Basophils Absolute: 0.1 10*3/uL (ref 0.0–0.1)
Basophils Relative: 2.3 % (ref 0.0–3.0)
Basophils Relative: 2 %
Eosinophils Absolute: 0 10*3/uL (ref 0.0–0.7)
Eosinophils Absolute: 0 10*3/uL (ref 0.0–0.5)
Eosinophils Relative: 0.4 % (ref 0.0–5.0)
Eosinophils Relative: 0 %
HCT: 24.6 % — ABNORMAL LOW (ref 36.0–46.0)
HCT: 25.6 % — ABNORMAL LOW (ref 36.0–46.0)
Hemoglobin: 8 g/dL — CL (ref 12.0–15.0)
Hemoglobin: 8 g/dL — ABNORMAL LOW (ref 12.0–15.0)
Immature Granulocytes: 23 %
Lymphocytes Relative: 18.8 % (ref 12.0–46.0)
Lymphocytes Relative: 20 %
Lymphs Abs: 1 10*3/uL (ref 0.7–4.0)
Lymphs Abs: 1.2 10*3/uL (ref 0.7–4.0)
MCH: 28.8 pg (ref 26.0–34.0)
MCHC: 32.6 g/dL (ref 30.0–36.0)
MCHC: 31.3 g/dL (ref 30.0–36.0)
MCV: 90.1 fl (ref 78.0–100.0)
MCV: 92.1 fL (ref 80.0–100.0)
Monocytes Absolute: 0.9 10*3/uL (ref 0.1–1.0)
Monocytes Absolute: 0.9 10*3/uL (ref 0.1–1.0)
Monocytes Relative: 17.3 % — ABNORMAL HIGH (ref 3.0–12.0)
Monocytes Relative: 16 %
Neutro Abs: 3.1 10*3/uL (ref 1.4–7.7)
Neutro Abs: 2.4 10*3/uL (ref 1.7–7.7)
Neutrophils Relative %: 61.2 % (ref 43.0–77.0)
Neutrophils Relative %: 39 %
Platelets: 76 10*3/uL — ABNORMAL LOW (ref 150.0–400.0)
Platelets: 88 10*3/uL — ABNORMAL LOW (ref 150–400)
RBC: 2.73 Mil/uL — ABNORMAL LOW (ref 3.87–5.11)
RBC: 2.78 MIL/uL — ABNORMAL LOW (ref 3.87–5.11)
RDW: 22.8 % — ABNORMAL HIGH (ref 11.5–15.5)
RDW: 21 % — ABNORMAL HIGH (ref 11.5–15.5)
WBC: 5.1 10*3/uL (ref 4.0–10.5)
WBC: 5.9 10*3/uL (ref 4.0–10.5)
nRBC: 2.5 % — ABNORMAL HIGH (ref 0.0–0.2)

## 2019-06-18 LAB — COMPREHENSIVE METABOLIC PANEL
ALT: 18 U/L (ref 0–44)
AST: 28 U/L (ref 15–41)
Albumin: 4.2 g/dL (ref 3.5–5.0)
Alkaline Phosphatase: 68 U/L (ref 38–126)
Anion gap: 11 (ref 5–15)
BUN: 10 mg/dL (ref 8–23)
CO2: 24 mmol/L (ref 22–32)
Calcium: 9.6 mg/dL (ref 8.9–10.3)
Chloride: 105 mmol/L (ref 98–111)
Creatinine, Ser: 0.66 mg/dL (ref 0.44–1.00)
GFR calc Af Amer: >60 mL/min (ref >60)
GFR calc non Af Amer: >60 mL/min (ref >60)
Glucose, Bld: 113 mg/dL — ABNORMAL HIGH (ref 70–99)
Potassium: 3.6 mmol/L (ref 3.5–5.1)
Sodium: 140 mmol/L (ref 135–145)
Total Bilirubin: 0.9 mg/dL (ref 0.3–1.2)
Total Protein: 8.5 g/dL — ABNORMAL HIGH (ref 6.5–8.1)

## 2019-06-18 LAB — TSH
TSH: 4.58 u[IU]/mL — ABNORMAL HIGH (ref 0.35–4.50)
TSH: 4.006 u[IU]/mL (ref 0.350–4.500)

## 2019-06-18 LAB — ABO/RH: ABO/RH(D): O POS

## 2019-06-18 LAB — TROPONIN I (HIGH SENSITIVITY): Troponin I (High Sensitivity): 26 ng/L — ABNORMAL HIGH (ref <18)

## 2019-06-18 MED ORDER — LABETALOL HCL 5 MG/ML IV SOLN
20.0000 mg | Freq: Once | INTRAVENOUS | Status: AC
  Administered 2019-06-18: 20 mg via INTRAVENOUS
  Filled 2019-06-18: qty 4

## 2019-06-18 MED ORDER — SODIUM CHLORIDE 0.9 % IV SOLN: 10.0000 mL/h | Freq: Once | INTRAVENOUS | Status: DC

## 2019-06-18 MED ORDER — CARVEDILOL 6.25 MG PO TABS
6.2500 mg | ORAL_TABLET | Freq: Once | ORAL | Status: AC
  Administered 2019-06-18: 6.25 mg via ORAL
  Filled 2019-06-18: qty 1

## 2019-06-18 NOTE — Telephone Encounter (Signed)
Patient is currently dizzy and light-headed she  Will call daughter to take her to the ER, Notified ER

## 2019-06-18 NOTE — ED Notes (Signed)
Pt speaking with this RN in NAD, A&Ox4. Reports no new pain, pt has hx of RA.

## 2019-06-18 NOTE — ED Provider Notes (Signed)
[facility] Emergency Department Provider Note  ____________________________________________  Time seen: Approximately 5:58 PM  Medical screening exam note.   HISTORY  Chief Complaint Weakness   HPI Caroline Nichols is a 78 y.o. female presenting to the emergency department after her Hgb today was 8.0. Over the past 2 weeks, she hasn't felt well. She reports feeling weak and occasional dizziness.  Patient states that she has been told that she "just does not make enough blood."  She denies any hematochezia or loss of blood from any other known source.  She denies chest pain or shortness of breath.  Past Medical History:  Diagnosis Date  . Anemia 02/2013   F/U with Dr. Orlie Dakin for Feraheme injections  . Anxiety   . Arthritis    Possible RA - Follow up appt with Dr. Gavin Potters  . Cellulitis of arm, right   . Chest pain 03/26/2013  . Diabetes insipidus (HCC)    On desmopressin  . GERD (gastroesophageal reflux disease)   . H/O seasonal allergies   . H/O transfusion of packed red blood cells 02/2013   3 units PRBC for severe anemia 02/2013  . Herniated disc   . History of kidney stones   . Hyperlipidemia   . Hypertension   . Hyperthyroidism    s/p radiation therapy, now hypothyroidism  . IBS (irritable bowel syndrome)   . Lung cancer (HCC) 06/2017   left  . Migraine   . Pacemaker    a. s/p successful implantation of a Medtronic CapSureFix Novus MRIT SureScan serial # [id number] on 06/28/16 by Dr. Graciela Husbands for sinus node dysfunction.  . Pancreatic cyst   . Paroxysmal A-fib (HCC)   . Renal cell cancer, right (HCC) 10/23/2015   Right partial nephrectomy.    Patient Active Problem List   Diagnosis Date Noted  . Educated about COVID-19 virus infection 12/20/2018  . GERD (gastroesophageal reflux disease) 02/20/2018  . Night sweats 01/11/2018  . Diarrhea in adult patient 01/11/2018  . Chronic respiratory failure (HCC) 01/01/2018  . Statin intolerance  10/16/2017  . B12 deficiency 10/16/2017  . Concussion 07/20/2017  . Neuroendocrine carcinoma of lung (HCC) 07/16/2017  . Special screening for malignant neoplasms, colon 09/11/2016  . Encounter for Medicare annual wellness exam 07/21/2016  . Vitamin D deficiency 07/21/2016  . Pacemaker   . Sinus node dysfunction (HCC) 05/23/2016  . History of atrial fibrillation 10/28/2015  . History of renal cell carcinoma 10/23/2015  . Goals of care, counseling/discussion 10/12/2015  . Urinary frequency 07/28/2015  . Inflammatory arthritis 05/10/2015  . Nocturia 04/16/2015  . Hyperlipidemia 12/17/2014  . Major depressive disorder with current active episode 12/14/2014  . Xerostomia 11/14/2014  . S/P hysterectomy 06/09/2014  . Atherosclerotic peripheral vascular disease with intermittent claudication (HCC) 10/07/2013  . Left carotid bruit 03/26/2013  . Acquired pancytopenia (HCC) 03/20/2013  . Bradycardia 12/09/2012  . Cervical vertebral fusion 12/09/2012  . Neuropathy 12/09/2012  . History of tobacco abuse 12/09/2012  . IBS (irritable bowel syndrome)   . Degenerative arthritis of lumbar spine   . Mild mitral regurgitation 09/27/2009  . Postablative hypothyroidism 05/15/2009  . Anxiety state 05/15/2009  . Essential hypertension 05/15/2009    Past Surgical History:  Procedure Laterality Date  . ABDOMINAL HYSTERECTOMY    . BREAST EXCISIONAL BIOPSY Right 2005   neg  . BREAST SURGERY  1990s   Biopsy  . CERVICAL SPINE SURGERY     Bone fusion  . [place]  2008?   [place]  .  COLONOSCOPY WITH PROPOFOL N/A 10/15/2016   Procedure: COLONOSCOPY WITH PROPOFOL;  Surgeon: Earline Mayotte, MD;  Location: [facility] ENDOSCOPY;  Service: Endoscopy;  Laterality: N/A;  . EP IMPLANTABLE DEVICE N/A 06/28/2016   Procedure: Pacemaker Implant;  Surgeon: Duke Salvia, MD;  Location: [facility] INVASIVE CV LAB;  Service: Cardiovascular;  Laterality: N/A;  . HAMMER TOE SURGERY    . HERNIA REPAIR Left     Inguinal Hernia Repair  . PORTA CATH INSERTION N/A 07/24/2017   Procedure: PORTA CATH INSERTION;  Surgeon: Annice Needy, MD;  Location: [facility] INVASIVE CV LAB;  Service: Cardiovascular;  Laterality: N/A;  . ROBOTIC ASSITED PARTIAL NEPHRECTOMY Right 10/23/2015   Procedure: ROBOTIC ASSITED PARTIAL NEPHRECTOMY with intraop ultrasound;  Surgeon: Vanna Scotland, MD;  Location: [facility] ORS;  Service: Urology;  Laterality: Right;  . TUBAL LIGATION      Prior to Admission medications   Medication Sig Start Date End Date Taking? Authorizing Provider  acetaminophen (TYLENOL) 325 MG tablet Take 2 tablets (650 mg total) by mouth every 6 (six) hours as needed for pain. Patient taking differently: Take 650 mg by mouth every 6 (six) hours as needed for pain.  01/04/13   Hongalgi, Maximino Greenland, MD  albuterol (PROVENTIL HFA;VENTOLIN HFA) 108 (90 Base) MCG/ACT inhaler Inhale 2 puffs into the lungs every 6 (six) hours as needed for wheezing or shortness of breath. 12/23/17   Enedina Finner, MD  ALPRAZolam Prudy Feeler) 0.25 MG tablet TAKE ONE TABLET BY MOUTH TWICE A DAY AS NEEDED FOR ANXIETY 12/30/18   Sherlene Shams, MD  brimonidine (ALPHAGAN) 0.2 % ophthalmic solution Place 1 drop into both eyes 2 (two) times daily. 10/31/14   [provider]  Calcium Carbonate-Vitamin D (CALCIUM-CARB 600 + D) 600-125 MG-UNIT TABS Take 2 tablets by mouth daily.    [provider]  carvedilol (COREG) 6.25 MG tablet TAKE ONE TABLET BY MOUTH TWICE A DAY 05/21/19   Sherlene Shams, MD  cholecalciferol (VITAMIN D) 1000 units tablet Take 1,000 Units by mouth daily.    [provider]  dorzolamide (TRUSOPT) 2 % ophthalmic solution Place 1 drop into both eyes 2 (two) times daily.     [provider]  ferrous sulfate 325 (65 FE) MG tablet Take 325 mg by mouth daily with breakfast.    [provider]  furosemide (LASIX) 20 MG tablet TAKE ONE-HALF TABLET BY MOUTH DAILY 05/13/19   Sherlene Shams, MD  gabapentin  (NEURONTIN) 300 MG capsule TAKE ONE CAPSULE BY MOUTH THREE TIMES A DAY AS NEEDED FOR SHINGLES PAIN 05/24/19   Sherlene Shams, MD  hydroxychloroquine (PLAQUENIL) 200 MG tablet Take 400 mg by mouth daily.  12/11/15   [provider]  latanoprost (XALATAN) 0.005 % ophthalmic solution Place 1 drop into both eyes at bedtime.  01/02/16   [provider]  levothyroxine (SYNTHROID) 100 MCG tablet Take 1 tablet (100 mcg total) by mouth every morning. 05/06/19   Sherlene Shams, MD  losartan (COZAAR) 50 MG tablet TAKE ONE TABLET BY MOUTH DAILY 12/30/18   Sherlene Shams, MD  mirtazapine (REMERON) 15 MG tablet TAKE ONE TABLET BY MOUTH EVERY NIGHT AT BEDTIME 04/29/19   Jeralyn Ruths, MD  MYRBETRIQ 50 MG TB24 tablet TAKE ONE TABLET BY MOUTH DAILY 01/22/19   Vanna Scotland, MD  nitroGLYCERIN (NITROLINGUAL) 0.4 MG/SPRAY spray Place 1 spray under the tongue every 5 (five) minutes x 3 doses as needed for chest pain. 12/11/18   Julien Nordmann  J, MD  Omega-3 Fatty Acids (FISH OIL) 1000 MG CAPS Take 1,000 mg by mouth daily.     [provider]  pantoprazole (PROTONIX) 40 MG tablet TAKE ONE TABLET BY MOUTH DAILY 02/15/19   Sherlene Shams, MD  PARoxetine (PAXIL) 10 MG tablet TAKE ONE TABLET BY MOUTH DAILY AFTER DINNER 03/10/19   Sherlene Shams, MD  timolol (TIMOPTIC) 0.5 % ophthalmic solution Place 1 drop into both eyes 2 (two) times daily.     [provider]  traZODone (DESYREL) 50 MG tablet Take 0.5-1 tablets (25-50 mg total) by mouth at bedtime as needed for sleep. 05/04/19   Sherlene Shams, MD  vitamin C (ASCORBIC ACID) 500 MG tablet Take 500 mg by mouth daily.    [provider]    Allergies Metrizamide, Adhesive [tape], Augmentin [amoxicillin-pot clavulanate], Codeine, Morphine and related, Other, Tetanus toxoid, and Tramadol  Family History  Problem Relation Age of Onset  . Heart disease Mother   . Heart disease Father   . Diabetes Brother   . Kidney disease  Brother   . Stroke Daughter        due to medication reaction  . Breast cancer Cousin   . Prostate cancer Neg Hx   . Hematuria Neg Hx     Social History Social History   Tobacco Use  . Smoking status: Former Smoker    Packs/day: 0.50    Years: 30.00    Pack years: 15.00    Types: Cigarettes    Quit date: 12/03/2012    Years since quitting: 6.5  . Smokeless tobacco: Never Used  Substance Use Topics  . Alcohol use: No    Alcohol/week: 0.0 standard drinks  . Drug use: No    Review of Systems Constitutional: Negative for fever. Respiratory: Negative for shortness of breath Gastrointestinal: No abdominal pain.  No nausea, no vomiting.  No diarrhea.  Musculoskeletal: Negative for generalized body aches. Skin: Negative for rash/lesion/wound. Neurological: Negative for headaches, focal weakness or numbness.  ____________________________________________   PHYSICAL EXAM:  Today's Vitals   06/18/19 1800 06/18/19 1802  BP:  (!) 196/100  Pulse:  91  Resp:  19  Temp:  98.9 F (37.2 C)  SpO2:  97%  Weight: 64.7 kg   Height: 5\' 2"  (1.575 m)   PainSc: 5     Body mass index is 26.08 kg/m.   Constitutional: Alert and oriented.  Chronically ill appearing and in no acute distress. Eyes: Conjunctivae are normal.  Pale mucosa Head: Atraumatic. Nose: No congestion/rhinnorhea. Mouth/Throat: Mucous membranes are moist. Neck: No stridor.  Cardiovascular:  Good peripheral circulation. Respiratory: Normal respiratory effort. Musculoskeletal: Full ROM throughout.  Neurologic:  Normal speech and language. No gross focal neurologic deficits are appreciated. Speech is normal. No gait instability. Skin:  Skin is warm, dry and intact. No rash noted. Psychiatric: Mood and affect are normal. Speech and behavior are normal.  ____________________________________________   LABS (all labs ordered are listed, but only abnormal results are displayed)  Labs Reviewed - No data to  display ____________________________________________  EKG   ____________________________________________  RADIOLOGY   ____________________________________________   PROCEDURES   ____________________________________________   INITIAL IMPRESSION / ASSESSMENT AND PLAN / ED COURSE     Pertinent labs & imaging results that were available during my care of the patient were reviewed by me and considered in my medical decision making (see chart for details).  Patient was advised to wait for ER room assignment.  Initial labs  have been ordered as appropriate. ____________________________________________   FINAL CLINICAL IMPRESSION(S) / ED DIAGNOSES  Final diagnoses:  None       Chinita Pester, FNP 06/18/19 1610    Shaune Pollack, MD 06/19/19 1558

## 2019-06-18 NOTE — H&P (Signed)
[place] [facility] [place] consult note:  PATIENT NAME: Caroline Nichols    MR#:  [id number]  DATE OF BIRTH:  Jan 01, 1941  DATE OF consult :  06/18/2019  PRIMARY CARE PHYSICIAN: [name], MD   REQUESTING/REFERRING PHYSICIAN: Conni Slipper, MD CHIEF COMPLAINT:   Chief Complaint  Patient presents with   Weakness   The patient was seen and examined on 06/18/2019 HISTORY OF PRESENT ILLNESS:  Caroline Nichols  is a 78 y.o. Caucasian female with a known history of multiple medical rounds that be mentioned below including chronic anemia, hypertension, dyslipidemia, post ablative hypothyroidism, IBS and GERD, who presented to the emergency room with the onset of symptomatic anemia.  She had a CBC by her primary care physician that showed hemoglobin of 8 hematocrit 24.6 compared to 9.4 and 29.1 on 05/04/2019 and 12.2/36.1 on 08/21/2018.  The patient admitted to generalized fatigue and tiredness for the last few weeks.  She admitted to occasional palpitations without chest pain.  She admitted to lightheadedness and occasional dizziness without diplopia.  No paresthesias or focal muscle weakness.  She noticed her stools are dark but not melanotic as she is on iron.  She denies any bright red bleeding per rectum.  She has reflux symptoms but denied any current heartburn or hematemesis.  No other bleeding diathesis.  No dysuria, oliguria or hematuria or flank pain.  No cough or wheezing or hemoptysis.  No fever or chills.  No exposures to COVID-19.  Upon presentation to the emergency room, blood pressure was 196/102 and later 202 /89.  Vital signs otherwise were within normal.  Labs revealed hemoglobin of 8 and hematocrit 25.6 with MCV of 92.1 MCH 28.8 and MCHC of 31.3.  Platelets were 88 and portable chest x-ray showed left lower lobe atelectasis versus infiltrate.  The patient was typed and crossmatched and will be transfused 1 unit of packed red blood cells.  I was consulted for medical  evaluation and management. PAST MEDICAL HISTORY:   Past Medical History:  Diagnosis Date   Anemia 02/2013   F/U with Dr. Grayland Ormond for Feraheme injections   Anxiety    Arthritis    Possible RA - Follow up appt with Dr. Jefm Bryant   Cellulitis of arm, right    Chest pain 03/26/2013   Diabetes insipidus ([place])    On desmopressin   GERD (gastroesophageal reflux disease)    H/O seasonal allergies    H/O transfusion of packed red blood cells 02/2013   3 units PRBC for severe anemia 02/2013   Herniated disc    History of kidney stones    Hyperlipidemia    Hypertension    Hyperthyroidism    s/p radiation therapy, now hypothyroidism   IBS (irritable bowel syndrome)    Lung cancer ([place]) 06/2017   left   Migraine    Pacemaker    a. s/p successful implantation of a Medtronic CapSureFix Novus MRI SureScan serial # [id number] on 06/28/16 by Dr. Caryl Comes for sinus node dysfunction.   Pancreatic cyst    Paroxysmal A-fib [facility])    Renal cell cancer, right ([place]) 10/23/2015   Right partial nephrectomy.    PAST SURGICAL HISTORY:   Past Surgical History:  Procedure Laterality Date   ABDOMINAL HYSTERECTOMY     BREAST EXCISIONAL BIOPSY Right 2005   neg   BREAST SURGERY  1990s   Biopsy   CERVICAL SPINE SURGERY     Bone fusion   [place]  2008?  [place]   COLONOSCOPY WITH PROPOFOL N/A 10/15/2016   Procedure: COLONOSCOPY WITH PROPOFOL;  Surgeon: Robert Bellow, MD;  Location[facility] ENDOSCOPY;  Service: Endoscopy;  Laterality: N/A;   EP IMPLANTABLE DEVICE N/A 06/28/2016   Procedure: Pacemaker Implant;  Surgeon: Deboraha Sprang, MD;  Location: [place] CV LAB;  Service: Cardiovascular;  Laterality: N/A;   HAMMER TOE SURGERY     HERNIA REPAIR Left    Inguinal Hernia Repair   PORTA CATH INSERTION N/A 07/24/2017   Procedure: PORTA CATH INSERTION;  Surgeon: Algernon Huxley, MD;  Location: [place] CV LAB;  Service: Cardiovascular;  Laterality: N/A;    ROBOTIC ASSITED PARTIAL NEPHRECTOMY Right 10/23/2015   Procedure: ROBOTIC ASSITED PARTIAL NEPHRECTOMY with intraop ultrasound;  Surgeon: Hollice Espy, MD;  Location: [facility] ORS;  Service: Urology;  Laterality: Right;   TUBAL LIGATION      SOCIAL HISTORY:   Social History   Tobacco Use   Smoking status: Former Smoker    Packs/day: 0.50    Years: 30.00    Pack years: 15.00    Types: Cigarettes    Quit date: 12/03/2012    Years since quitting: 6.5   Smokeless tobacco: Never Used  Substance Use Topics   Alcohol use: No    Alcohol/week: 0.0 standard drinks    FAMILY HISTORY:   Family History  Problem Relation Age of Onset   Heart disease Mother    Heart disease Father    Diabetes Brother    Kidney disease Brother    Stroke Daughter        due to medication reaction   Breast cancer Cousin    Prostate cancer Neg Hx    Hematuria Neg Hx     DRUG ALLERGIES:   Allergies  Allergen Reactions   Metrizamide     Other reaction(s): Other (See Comments)   Adhesive [Tape] Other (See Comments)    "irritate skin"   Augmentin [Amoxicillin-Pot Clavulanate] Diarrhea    Severe diarrhea   Codeine     REACTION: NAUSEA   Morphine And Related Other (See Comments)    Shut down her organs   Other     Skin irritation   Tetanus Toxoid     REACTION: ARM SWELLING,REDNESS   Tramadol Nausea And Vomiting    REVIEW OF SYSTEMS:   ROS As per history of present illness. All pertinent systems were reviewed above. Constitutional,  HEENT, cardiovascular, respiratory, GI, GU, musculoskeletal, neuro, psychiatric, endocrine,  integumentary and hematologic systems were reviewed and are otherwise  negative/unremarkable except for positive findings mentioned above in the HPI.   MEDICATIONS AT HOME:   Prior to Admission medications   Medication Sig Start Date End Date Taking? Authorizing Provider  acetaminophen (TYLENOL) 325 MG tablet Take 2 tablets (650 mg total) by mouth every  6 (six) hours as needed for pain. Patient taking differently: Take 650 mg by mouth every 6 (six) hours as needed for pain.  01/04/13   Hongalgi, Lenis Dickinson, MD  albuterol (PROVENTIL HFA;VENTOLIN HFA) 108 (90 Base) MCG/ACT inhaler Inhale 2 puffs into the lungs every 6 (six) hours as needed for wheezing or shortness of breath. 12/23/17   Fritzi Mandes, MD  ALPRAZolam Duanne Moron) 0.25 MG tablet TAKE ONE TABLET BY MOUTH TWICE A DAY AS NEEDED FOR ANXIETY 12/30/18   [name], MD  brimonidine (ALPHAGAN) 0.2 % ophthalmic solution Place 1 drop into both eyes 2 (two) times daily. 10/31/14   [provider]  Calcium Carbonate-Vitamin  D (CALCIUM-CARB 600 + D) 600-125 MG-UNIT TABS Take 2 tablets by mouth daily.    [provider]  carvedilol (COREG) 6.25 MG tablet TAKE ONE TABLET BY MOUTH TWICE A DAY 05/21/19   [name], MD  cholecalciferol (VITAMIN D) 1000 units tablet Take 1,000 Units by mouth daily.    [provider]  dorzolamide (TRUSOPT) 2 % ophthalmic solution Place 1 drop into both eyes 2 (two) times daily.     [provider]  ferrous sulfate 325 (65 FE) MG tablet Take 325 mg by mouth daily with breakfast.    [provider]  furosemide (LASIX) 20 MG tablet TAKE ONE-HALF TABLET BY MOUTH DAILY 05/13/19   [name], MD  gabapentin (NEURONTIN) 300 MG capsule TAKE ONE CAPSULE BY MOUTH THREE TIMES A DAY AS NEEDED FOR SHINGLES PAIN 05/24/19   [name], MD  hydroxychloroquine (PLAQUENIL) 200 MG tablet Take 400 mg by mouth daily.  12/11/15   [provider]  latanoprost (XALATAN) 0.005 % ophthalmic solution Place 1 drop into both eyes at bedtime.  01/02/16   [provider]  levothyroxine (SYNTHROID) 100 MCG tablet Take 1 tablet (100 mcg total) by mouth every morning. 05/06/19   [name], MD  losartan (COZAAR) 50 MG tablet TAKE ONE TABLET BY MOUTH DAILY 12/30/18   [name], MD  mirtazapine (REMERON) 15 MG tablet TAKE ONE TABLET  BY MOUTH EVERY NIGHT AT BEDTIME 04/29/19   Lloyd Huger, MD  MYRBETRIQ 50 MG TB24 tablet TAKE ONE TABLET BY MOUTH DAILY 01/22/19   Hollice Espy, MD  nitroGLYCERIN (NITROLINGUAL) 0.4 MG/SPRAY spray Place 1 spray under the tongue every 5 (five) minutes x 3 doses as needed for chest pain. 12/11/18   Minna Merritts, MD  Omega-3 Fatty Acids (FISH OIL) 1000 MG CAPS Take 1,000 mg by mouth daily.     [provider]  pantoprazole (PROTONIX) 40 MG tablet TAKE ONE TABLET BY MOUTH DAILY 02/15/19   [name], MD  PARoxetine (PAXIL) 10 MG tablet TAKE ONE TABLET BY MOUTH DAILY AFTER DINNER 03/10/19   [name], MD  timolol (TIMOPTIC) 0.5 % ophthalmic solution Place 1 drop into both eyes 2 (two) times daily.     [provider]  traZODone (DESYREL) 50 MG tablet Take 0.5-1 tablets (25-50 mg total) by mouth at bedtime as needed for sleep. 05/04/19   [name], MD  vitamin C (ASCORBIC ACID) 500 MG tablet Take 500 mg by mouth daily.    [provider]      VITAL SIGNS:  Blood pressure (!) 202/89, pulse 93, temperature 98.9 F (37.2 C), resp. rate 18, height 5\' 2"  (1.575 m), weight 64.7 kg, SpO2 98 %.  PHYSICAL EXAMINATION:  Physical Exam  GENERAL:  78 y.o.-year-old Caucasian female patient lying in the bed with no acute distress.  EYES: Pupils equal, round, reactive to light and accommodation. No scleral icterus. Extraocular muscles intact.  HEENT: Head atraumatic, normocephalic. Oropharynx and nasopharynx clear.  NECK:  Supple, no jugular venous distention. No thyroid enlargement, no tenderness.  LUNGS: Normal breath sounds bilaterally, no wheezing, rales,rhonchi or crepitation. No use of accessory muscles of respiration.  CARDIOVASCULAR: Regular rate and rhythm, S1, S2 normal. No murmurs, rubs, or gallops.  ABDOMEN: Soft, nondistended, nontender. Bowel sounds present. No organomegaly or mass.  She had a rectal exam by Dr. Cinda Quest which revealed negative stool  Hemoccult. EXTREMITIES: No pedal edema, cyanosis, or clubbing.  NEUROLOGIC: Cranial nerves II through XII are intact. Muscle strength 5/5 in all extremities. Sensation intact. Gait not checked.  PSYCHIATRIC: The patient is alert and oriented x 3.  Normal affect and good eye contact. SKIN: No obvious rash, lesion, or ulcer.   LABORATORY PANEL:   CBC Recent Labs  Lab 06/18/19 1816  WBC 5.9  HGB 8.0*  HCT 25.6*  PLT 88*   ------------------------------------------------------------------------------------------------------------------  Chemistries  No results for input(s): NA, K, CL, CO2, GLUCOSE, BUN, CREATININE, CALCIUM, MG, AST, ALT, ALKPHOS, BILITOT in the last 168 hours.  Invalid input(s): GFRCGP ------------------------------------------------------------------------------------------------------------------  Cardiac Enzymes No results for input(s): TROPONINI in the last 168 hours. ------------------------------------------------------------------------------------------------------------------  RADIOLOGY:  Dg Chest Portable 1 View  Result Date: 06/18/2019 CLINICAL DATA:  Weakness EXAM: PORTABLE CHEST 1 VIEW COMPARISON:  12/31/2017 FINDINGS: Left pacer and right Port-A-Cath remain in place, unchanged. Cardiomegaly. Left lower lobe airspace opacity could reflect atelectasis or infiltrate. Right lung clear. No acute bony abnormality. IMPRESSION: Cardiomegaly. Left lower lobe atelectasis or infiltrate. Electronically Signed   By: Rolm Baptise M.D.   On: 06/18/2019 22:53   IMPRESSION AND PLAN:   1.  Symptomatic anemia.  The patient had a negative stool Hemoccults.  I added serum iron total iron-binding capacity, ferritin, reticulocytes, vitamin B12 and folate as well as LDH and haptoglobin for further anemia work-up.  She was typed and crossmatch and will be transfused 1 unit of packed red blood cells.  Will follow posttransfusion H&H.  That should get her close to her baseline.   I agree with Dr. Cinda Quest that she can safely be discharged home especially with improved blood pressure.  Anemia work-up can be further followed by her primary care physician.  2.  Hypertensive urgency.  This likely to being stressed out since staying for longer than expected in the ER and likely missing her Coreg and Cozaar.  Will give 20 mg of IV labetalol as well as her Coreg and monitor blood pressure.  3.  Hypothyroidism.  We will check TSH level and continue her Synthroid.  4.  GERD.  PPI therapy t will be continued.  5.  Anxiety and depression.  Paxil and as needed Xanax will be continued.  6.  DVT prophylaxis.  SCDs if needed for longer study.  GI prophylaxis was addressed above.   All the records are reviewed and case discussed with ED provider. The plan of care was discussed in details with the patient (and family). I answered all questions. The patient agreed to proceed with the above mentioned plan. Further management will depend upon hospital course.  Thank you Dr. Cinda Quest for allowing me to participate in the care of this very pleasant lady.  The patient will be discharged by Dr. Joni Fears when she finishes packed red blood cells transfusion.  CODE STATUS: Full code  TOTAL TIME TAKING CARE OF THIS PATIENT:  minutes.    Christel Mormon M.D on 06/18/2019 at 11:05 PM  [facility]   From 7 PM-7 AM, contact night-coverage [email or web address]  CC: Primary care physician; [name], MD   Note: This dictation was prepared with Dragon dictation along with smaller phrase technology. Any transcriptional errors that result from this process are unintentional.

## 2019-06-18 NOTE — ED Notes (Signed)
Attempted blood draw without success by this RN, lab contacted to come and draw labs

## 2019-06-18 NOTE — ED Triage Notes (Signed)
Pt comes via POV from home with c/o weakness and low Hbg. Pt states she had bloodwork done today at her PCP and was later called and informed that her Hgb was 8. Pt was advised to come to ER.  Pt states she has not been feeling well for over a week. Pt denies any CP, SOB or dark tarry stools. Pt denies any blood in urine.  Pt states hx of blood transfusions in past.  Pt looks pale in appearance.

## 2019-06-18 NOTE — Telephone Encounter (Signed)
Her hemoglobin has dropped from 9.4 to 8.0  In a little over 6 weeks.  If she is short of breath or dizzy, or having chest pain,  She needs to go to ER to be transfused .  I will contact Dr Grayland Ormond so they can get her in next week

## 2019-06-18 NOTE — ED Notes (Signed)
Lab called to draw additional labs on patient that were ordered after she originally drew blood

## 2019-06-18 NOTE — ED Notes (Signed)
Dr. Cinda Quest offered pt a tray and pt states "I dont want that dead [place] and that dead cheese"

## 2019-06-18 NOTE — ED Provider Notes (Addendum)
[facility] Emergency Department Provider Note   ____________________________________________   First MD Initiated Contact with Patient 06/18/19 2224     (approximate)  I have reviewed the triage vital signs and the nursing notes.   HISTORY  Chief Complaint Weakness    HPI Caroline Nichols is a 78 y.o. female patient reports she had a hemoglobin of 9 point something and that dropped to 8 and she felt weak and dizzy so she called her doctor to the doctor told her to come into the emergency room.  I discussed with the patient that this is much higher than that level usually transfuse for which she has been waiting for a long time.  I ran this by the hospitalist and the hospitalist agrees that while this is a very weak indication for transfusion we can give her some blood.  This should make her feel better.  She is also got a low platelet count.  We will have her go back and finish following up with her doctor for further evaluation.  She has had a colonoscopy in the past that was negative.         Past Medical History:  Diagnosis Date  . Anemia 02/2013   F/U with Dr. Orlie Dakin for Feraheme injections  . Anxiety   . Arthritis    Possible RA - Follow up appt with Dr. Gavin Potters  . Cellulitis of arm, right   . Chest pain 03/26/2013  . Diabetes insipidus (HCC)    On desmopressin  . GERD (gastroesophageal reflux disease)   . H/O seasonal allergies   . H/O transfusion of packed red blood cells 02/2013   3 units PRBC for severe anemia 02/2013  . Herniated disc   . History of kidney stones   . Hyperlipidemia   . Hypertension   . Hyperthyroidism    s/p radiation therapy, now hypothyroidism  . IBS (irritable bowel syndrome)   . Lung cancer (HCC) 06/2017   left  . Migraine   . Pacemaker    a. s/p successful implantation of a Medtronic CapSureFix Novus MRIT SureScan serial # [id number] on 06/28/16 by Dr. Graciela Husbands for sinus node dysfunction.  . Pancreatic cyst   .  Paroxysmal A-fib (HCC)   . Renal cell cancer, right (HCC) 10/23/2015   Right partial nephrectomy.    Patient Active Problem List   Diagnosis Date Noted  . Educated about COVID-19 virus infection 12/20/2018  . GERD (gastroesophageal reflux disease) 02/20/2018  . Night sweats 01/11/2018  . Diarrhea in adult patient 01/11/2018  . Chronic respiratory failure (HCC) 01/01/2018  . Statin intolerance 10/16/2017  . B12 deficiency 10/16/2017  . Concussion 07/20/2017  . Neuroendocrine carcinoma of lung (HCC) 07/16/2017  . Special screening for malignant neoplasms, colon 09/11/2016  . Encounter for Medicare annual wellness exam 07/21/2016  . Vitamin D deficiency 07/21/2016  . Pacemaker   . Sinus node dysfunction (HCC) 05/23/2016  . History of atrial fibrillation 10/28/2015  . History of renal cell carcinoma 10/23/2015  . Goals of care, counseling/discussion 10/12/2015  . Urinary frequency 07/28/2015  . Inflammatory arthritis 05/10/2015  . Nocturia 04/16/2015  . Hyperlipidemia 12/17/2014  . Major depressive disorder with current active episode 12/14/2014  . Xerostomia 11/14/2014  . S/P hysterectomy 06/09/2014  . Atherosclerotic peripheral vascular disease with intermittent claudication (HCC) 10/07/2013  . Left carotid bruit 03/26/2013  . Acquired pancytopenia (HCC) 03/20/2013  . Bradycardia 12/09/2012  . Cervical vertebral fusion 12/09/2012  . Neuropathy 12/09/2012  . History  of tobacco abuse 12/09/2012  . IBS (irritable bowel syndrome)   . Degenerative arthritis of lumbar spine   . Mild mitral regurgitation 09/27/2009  . Postablative hypothyroidism 05/15/2009  . Anxiety state 05/15/2009  . Essential hypertension 05/15/2009    Past Surgical History:  Procedure Laterality Date  . ABDOMINAL HYSTERECTOMY    . BREAST EXCISIONAL BIOPSY Right 2005   neg  . BREAST SURGERY  1990s   Biopsy  . CERVICAL SPINE SURGERY     Bone fusion  . COLONOSCOPY  2008?   [place]  .  COLONOSCOPY WITH PROPOFOL N/A 10/15/2016   Procedure: COLONOSCOPY WITH PROPOFOL;  Surgeon: Earline Mayotte, MD;  Location: [facility] ENDOSCOPY;  Service: Endoscopy;  Laterality: N/A;  . EP IMPLANTABLE DEVICE N/A 06/28/2016   Procedure: Pacemaker Implant;  Surgeon: Duke Salvia, MD;  Location: [facility] INVASIVE CV LAB;  Service: Cardiovascular;  Laterality: N/A;  . HAMMER TOE SURGERY    . HERNIA REPAIR Left    Inguinal Hernia Repair  . PORTA CATH INSERTION N/A 07/24/2017   Procedure: PORTA CATH INSERTION;  Surgeon: Annice Needy, MD;  Location: [facility] INVASIVE CV LAB;  Service: Cardiovascular;  Laterality: N/A;  . ROBOTIC ASSITED PARTIAL NEPHRECTOMY Right 10/23/2015   Procedure: ROBOTIC ASSITED PARTIAL NEPHRECTOMY with intraop ultrasound;  Surgeon: Vanna Scotland, MD;  Location: [facility] ORS;  Service: Urology;  Laterality: Right;  . TUBAL LIGATION      Prior to Admission medications   Medication Sig Start Date End Date Taking? Authorizing Provider  acetaminophen (TYLENOL) 325 MG tablet Take 2 tablets (650 mg total) by mouth every 6 (six) hours as needed for pain. Patient taking differently: Take 650 mg by mouth every 6 (six) hours as needed for pain.  01/04/13  Yes Hongalgi, Maximino Greenland, MD  albuterol (PROVENTIL HFA;VENTOLIN HFA) 108 (90 Base) MCG/ACT inhaler Inhale 2 puffs into the lungs every 6 (six) hours as needed for wheezing or shortness of breath. 12/23/17  Yes Enedina Finner, MD  ALPRAZolam (XANAX) 0.25 MG tablet TAKE ONE TABLET BY MOUTH TWICE A DAY AS NEEDED FOR ANXIETY Patient taking differently: Take 0.25 mg by mouth 2 (two) times daily as needed for anxiety.  12/30/18  Yes Sherlene Shams, MD  brimonidine (ALPHAGAN) 0.2 % ophthalmic solution Place 1 drop into both eyes 3 (three) times daily.  10/31/14  Yes [provider]  Calcium Carbonate-Vitamin D (CALCIUM-CARB 600 + D) 600-125 MG-UNIT TABS Take 2 tablets by mouth daily.   Yes [provider]  carvedilol (COREG) 6.25 MG tablet TAKE ONE  TABLET BY MOUTH TWICE A DAY Patient taking differently: Take 6.25 mg by mouth 2 (two) times daily with a meal.  05/21/19  Yes Sherlene Shams, MD  cholecalciferol (VITAMIN D) 1000 units tablet Take 1,000 Units by mouth daily.   Yes [provider]  dorzolamide (TRUSOPT) 2 % ophthalmic solution Place 1 drop into both eyes 3 (three) times daily.    Yes [provider]  ferrous sulfate 325 (65 FE) MG tablet Take 325 mg by mouth daily with breakfast.   Yes [provider]  furosemide (LASIX) 20 MG tablet TAKE ONE-HALF TABLET BY MOUTH DAILY Patient taking differently: Take 10 mg by mouth daily.  05/13/19  Yes Sherlene Shams, MD  gabapentin (NEURONTIN) 300 MG capsule TAKE ONE CAPSULE BY MOUTH THREE TIMES A DAY AS NEEDED FOR SHINGLES PAIN Patient taking differently: Take 300 mg by mouth 3 (three) times daily. TAKE ONE CAPSULE BY MOUTH THREE  TIMES A DAY AS NEEDED FOR SHINGLES PAIN 05/24/19  Yes Sherlene Shams, MD  hydroxychloroquine (PLAQUENIL) 200 MG tablet Take 400 mg by mouth daily.  12/11/15  Yes [provider]  latanoprost (XALATAN) 0.005 % ophthalmic solution Place 1 drop into both eyes at bedtime.  01/02/16  Yes [provider]  levothyroxine (SYNTHROID) 100 MCG tablet Take 1 tablet (100 mcg total) by mouth every morning. 05/06/19  Yes Sherlene Shams, MD  losartan (COZAAR) 50 MG tablet TAKE ONE TABLET BY MOUTH DAILY Patient taking differently: Take 50 mg by mouth daily.  12/30/18  Yes Sherlene Shams, MD  mirtazapine (REMERON) 15 MG tablet TAKE ONE TABLET BY MOUTH EVERY NIGHT AT BEDTIME Patient taking differently: Take 15 mg by mouth at bedtime.  04/29/19  Yes Jeralyn Ruths, MD  MYRBETRIQ 50 MG TB24 tablet TAKE ONE TABLET BY MOUTH DAILY Patient taking differently: Take 50 mg by mouth daily.  01/22/19  Yes Vanna Scotland, MD  nitroGLYCERIN (NITROLINGUAL) 0.4 MG/SPRAY spray Place 1 spray under the tongue every 5 (five) minutes x 3 doses as needed for  chest pain. 12/11/18  Yes Gollan, Tollie Pizza, MD  Omega-3 Fatty Acids (FISH OIL) 1000 MG CAPS Take 1,000 mg by mouth daily.    Yes [provider]  pantoprazole (PROTONIX) 40 MG tablet TAKE ONE TABLET BY MOUTH DAILY Patient taking differently: Take 40 mg by mouth daily.  02/15/19  Yes Sherlene Shams, MD  PARoxetine (PAXIL) 10 MG tablet TAKE ONE TABLET BY MOUTH DAILY AFTER DINNER Patient taking differently: Take 10 mg by mouth daily after supper.  03/10/19  Yes Sherlene Shams, MD  traZODone (DESYREL) 50 MG tablet Take 0.5-1 tablets (25-50 mg total) by mouth at bedtime as needed for sleep. 05/04/19  Yes Sherlene Shams, MD  vitamin C (ASCORBIC ACID) 500 MG tablet Take 500 mg by mouth daily.   Yes [provider]    Allergies Metrizamide, Adhesive [tape], Augmentin [amoxicillin-pot clavulanate], Codeine, Morphine and related, Other, Tetanus toxoid, and Tramadol  Family History  Problem Relation Age of Onset  . Heart disease Mother   . Heart disease Father   . Diabetes Brother   . Kidney disease Brother   . Stroke Daughter        due to medication reaction  . Breast cancer Cousin   . Prostate cancer Neg Hx   . Hematuria Neg Hx     Social History Social History   Tobacco Use  . Smoking status: Former Smoker    Packs/day: 0.50    Years: 30.00    Pack years: 15.00    Types: Cigarettes    Quit date: 12/03/2012    Years since quitting: 6.5  . Smokeless tobacco: Never Used  Substance Use Topics  . Alcohol use: No    Alcohol/week: 0.0 standard drinks  . Drug use: No    Review of Systems  Constitutional: No fever/chills Eyes: No visual changes. ENT: No sore throat. Cardiovascular: Denies chest pain. Respiratory: Denies shortness of breath. Gastrointestinal: No abdominal pain.  No nausea, no vomiting.  No diarrhea.  No constipation. Genitourinary: Negative for dysuria. Musculoskeletal: Negative for back pain. Skin: Negative for rash. Neurological: Negative for  headaches, focal weakness   ____________________________________________   PHYSICAL EXAM:  VITAL SIGNS: ED Triage Vitals  Enc Vitals Group     BP 06/18/19 1802 (!) 196/100     Pulse Rate 06/18/19 1802 91     Resp 06/18/19 1802 19  Temp 06/18/19 1802 98.9 F (37.2 C)     Temp src --      SpO2 06/18/19 1802 97 %     Weight 06/18/19 1800 142 lb 9.6 oz (64.7 kg)     Height 06/18/19 1800 5\' 2"  (1.575 m)     Head Circumference --      Peak Flow --      Pain Score 06/18/19 1800 5     Pain Loc --      Pain Edu? --      Excl. in GC? --     Constitutional: Alert and oriented. Well appearing and in no acute distress. Eyes: Conjunctivae are normal. Head: Atraumatic. Nose: No congestion/rhinnorhea. Mouth/Throat: Mucous membranes are moist.  Oropharynx non-erythematous. Neck: No stridor. Cardiovascular: Normal rate, regular rhythm. Grossly normal heart sounds.  Good peripheral circulation. Respiratory: Normal respiratory effort.  No retractions. Lungs CTAB. Gastrointestinal: Soft and nontender. No distention.  Musculoskeletal: No lower extremity tenderness nor edema.   Neurologic:  Normal speech and language. No gross focal neurologic deficits are appreciated.  Skin:  Skin is warm, dry and intact. No rash noted.   ____________________________________________   LABS (all labs ordered are listed, but only abnormal results are displayed)  Labs Reviewed  CBC WITH DIFFERENTIAL/PLATELET - Abnormal; Notable for the following components:      Result Value   RBC 2.78 (*)    Hemoglobin 8.0 (*)    HCT 25.6 (*)    RDW 21.0 (*)    Platelets 88 (*)    nRBC 2.5 (*)    Abs Immature Granulocytes 1.34 (*)    All other components within normal limits  COMPREHENSIVE METABOLIC PANEL - Abnormal; Notable for the following components:   Glucose, Bld 113 (*)    Total Protein 8.5 (*)    All other components within normal limits  URINALYSIS, COMPLETE (UACMP) WITH MICROSCOPIC - Abnormal; Notable  for the following components:   Color, Urine STRAW (*)    APPearance CLEAR (*)    Hgb urine dipstick SMALL (*)    All other components within normal limits  RETICULOCYTES - Abnormal; Notable for the following components:   Retic Ct Pct 3.5 (*)    RBC. 2.84 (*)    Immature Retic Fract 23.2 (*)    All other components within normal limits  TROPONIN I (HIGH SENSITIVITY) - Abnormal; Notable for the following components:   Troponin I (High Sensitivity) 26 (*)    All other components within normal limits  TSH  PATHOLOGIST SMEAR REVIEW  IRON AND TIBC  FERRITIN  VITAMIN B12  FOLATE  LACTATE DEHYDROGENASE  HAPTOGLOBIN  TYPE AND SCREEN  ABO/RH  TYPE AND SCREEN  PREPARE RBC (CROSSMATCH)  TROPONIN I (HIGH SENSITIVITY)   ____________________________________________  EKG EKG read interpreted by me shows normal sinus rhythm rate of 77 normal axis nonspecific ST-T wave changes  ____________________________________________  RADIOLOGY  ED MD interpretation: Chest x-ray read by radiology reviewed by me is read as having a left lower lobe atelectasis or infiltrate. Official radiology report(s): Dg Chest Portable 1 View  Result Date: 06/18/2019 CLINICAL DATA:  Weakness EXAM: PORTABLE CHEST 1 VIEW COMPARISON:  12/31/2017 FINDINGS: Left pacer and right Port-A-Cath remain in place, unchanged. Cardiomegaly. Left lower lobe airspace opacity could reflect atelectasis or infiltrate. Right lung clear. No acute bony abnormality. IMPRESSION: Cardiomegaly. Left lower lobe atelectasis or infiltrate. Electronically Signed   By: Charlett Nose M.D.   On: 06/18/2019 22:53    ____________________________________________  PROCEDURES  Procedure(s) performed (including Critical Care): Critical care time 15 minutes this includes examining the patient reviewing her old records and speaking with the hospitalist setting of the transfusion and I am signing the patient out to the oncoming  physician.  Procedures   ____________________________________________   INITIAL IMPRESSION / ASSESSMENT AND PLAN / ED COURSE  Hospitalist sees the patient.  He ordered some more labs to check on her anemia.  Patient's TSH is come back high normal.  Rectal exam is Hemoccult negative.  Will sign out the patient to oncoming physician who will monitor her transfusion plan is to let her go after that.  She can follow-up with her doctor and Dr. Orlie Dakin in the cancer center.  Both of them are treating physicians of hers.              ____________________________________________   FINAL CLINICAL IMPRESSION(S) / ED DIAGNOSES  Final diagnoses:  Symptomatic anemia     ED Discharge Orders    None       Note:  This document was prepared using Dragon voice recognition software and may include unintentional dictation errors.    Arnaldo Natal, MD 06/19/19 [id number]    Arnaldo Natal, MD 06/29/19 [phone]

## 2019-06-19 DIAGNOSIS — D649 Anemia, unspecified: Secondary | ICD-10-CM | POA: Diagnosis not present

## 2019-06-19 LAB — CUP PACEART REMOTE DEVICE CHECK
Battery Remaining Longevity: 96 mo
Battery Voltage: 3.02 V
Brady Statistic AP VP Percent: 0.01 %
Brady Statistic AP VS Percent: 1.64 %
Brady Statistic AS VP Percent: 0.05 %
Brady Statistic AS VS Percent: 98.3 %
Brady Statistic RA Percent Paced: 1.65 %
Brady Statistic RV Percent Paced: 0.06 %
Date Time Interrogation Session: 20201112235529
Implantable Lead Implant Date: 20171124
Implantable Lead Implant Date: 20171124
Implantable Lead Location: 753859
Implantable Lead Location: 753860
Implantable Lead Model: 5076
Implantable Lead Model: 5076
Implantable Pulse Generator Implant Date: 20171124
Lead Channel Impedance Value: 285 Ohm
Lead Channel Impedance Value: 342 Ohm
Lead Channel Impedance Value: 361 Ohm
Lead Channel Impedance Value: 437 Ohm
Lead Channel Pacing Threshold Amplitude: 0.875 V
Lead Channel Pacing Threshold Amplitude: 1 V
Lead Channel Pacing Threshold Pulse Width: 0.4 ms
Lead Channel Pacing Threshold Pulse Width: 0.4 ms
Lead Channel Sensing Intrinsic Amplitude: 1 mV
Lead Channel Sensing Intrinsic Amplitude: 1 mV
Lead Channel Sensing Intrinsic Amplitude: 12 mV
Lead Channel Sensing Intrinsic Amplitude: 12 mV
Lead Channel Setting Pacing Amplitude: 2 V
Lead Channel Setting Pacing Amplitude: 2.5 V
Lead Channel Setting Pacing Pulse Width: 0.4 ms
Lead Channel Setting Sensing Sensitivity: 0.9 mV

## 2019-06-19 LAB — TROPONIN I (HIGH SENSITIVITY): Troponin I (High Sensitivity): 25 ng/L — ABNORMAL HIGH (ref <18)

## 2019-06-19 LAB — FERRITIN: Ferritin: 95 ng/mL (ref 11–307)

## 2019-06-19 LAB — IRON AND TIBC
Iron: 156 ug/dL (ref 28–170)
Saturation Ratios: 45 % — ABNORMAL HIGH (ref 10.4–31.8)
TIBC: 349 ug/dL (ref 250–450)
UIBC: 193 ug/dL

## 2019-06-19 LAB — PREPARE RBC (CROSSMATCH)

## 2019-06-19 LAB — FOLATE: Folate: 8.1 ng/mL (ref >5.9)

## 2019-06-19 LAB — VITAMIN B12: Vitamin B-12: 1305 pg/mL — ABNORMAL HIGH (ref 180–914)

## 2019-06-19 LAB — LACTATE DEHYDROGENASE: LDH: 649 U/L — ABNORMAL HIGH (ref 98–192)

## 2019-06-19 MED ORDER — AMLODIPINE BESYLATE 5 MG PO TABS: 10.0000 mg | ORAL_TABLET | Freq: Once | ORAL | Status: DC

## 2019-06-19 MED ORDER — LABETALOL HCL 5 MG/ML IV SOLN: 20.0000 mg | Freq: Two times a day (BID) | INTRAVENOUS | Status: DC | PRN

## 2019-06-19 NOTE — ED Provider Notes (Signed)
Procedures     ----------------------------------------- 3:09 AM on 06/19/2019 -----------------------------------------  Serial troponins are stable.  Patient received 1 unit of packed red blood cells transfusion and feels better.  Wants to go home.  No evidence of any adverse reaction so far, breathing comfortably, no urticaria.  Stable for discharge at this time to follow-up with primary care for further work-up and treatment of her anemia.   Carrie Mew, MD 06/19/19 [phone]

## 2019-06-19 NOTE — ED Notes (Signed)
Report given to Kate RN

## 2019-06-19 NOTE — ED Notes (Signed)
Offered pt and family member something to eat or drink and both of them decline

## 2019-06-19 NOTE — ED Notes (Signed)
Light green tube sent to lab 

## 2019-06-20 LAB — BPAM RBC
Blood Product Expiration Date: 202011152359
Blood Product Expiration Date: 202012052359
ISSUE DATE / TIME: 202011140006
ISSUE DATE / TIME: 202011150033
Unit Type and Rh: 5100
Unit Type and Rh: 5100

## 2019-06-20 LAB — TYPE AND SCREEN
ABO/RH(D): O POS
Antibody Screen: NEGATIVE
Unit division: 0
Unit division: 0

## 2019-06-20 LAB — HAPTOGLOBIN: Haptoglobin: 163 mg/dL (ref 42–346)

## 2019-06-20 NOTE — Assessment & Plan Note (Signed)
Underactive on 100 mcg daily.  Double dose on sundays until current bottle is finished  Then refil at `112 mcg daily   Lab Results  Component Value Date   TSH 4.006 06/18/2019

## 2019-06-21 ENCOUNTER — Telehealth: Payer: Self-pay | Admitting: Internal Medicine

## 2019-06-21 LAB — PATHOLOGIST SMEAR REVIEW

## 2019-06-21 NOTE — Telephone Encounter (Signed)
Patient is calling for advise regarding her  blood pressure.  Patient is taking losartan. And patient is concerned.  06/21/19 10:16a - 156/73: 06/21/19 1:00a- 174/83  06/19/19- 10:00am   Please advise.  Cb- [phone]

## 2019-06-21 NOTE — Telephone Encounter (Signed)
Tried to call patient and have her monitor bp readings until appointment no answer and no voicemail.

## 2019-06-21 NOTE — Telephone Encounter (Signed)
I can't change bp meds for 2 elevated readings done on same day  Not enough data

## 2019-06-21 NOTE — Telephone Encounter (Signed)
Advised patient of lab results she is currently taking Levothyroxine 100 mcg but already has some levotyroxine 112 mcg on hand, patient is concerned about BP readings as recorded below I have scheduled virtual for 06/23/19

## 2019-06-23 ENCOUNTER — Other Ambulatory Visit: Payer: Self-pay

## 2019-06-23 ENCOUNTER — Encounter: Payer: Self-pay | Admitting: Internal Medicine

## 2019-06-23 ENCOUNTER — Ambulatory Visit (INDEPENDENT_AMBULATORY_CARE_PROVIDER_SITE_OTHER): Payer: Medicare Other | Admitting: Internal Medicine

## 2019-06-23 VITALS — BP 176/77 | Ht 62.0 in | Wt 135.0 lb

## 2019-06-23 DIAGNOSIS — E890 Postprocedural hypothyroidism: Secondary | ICD-10-CM | POA: Diagnosis not present

## 2019-06-23 DIAGNOSIS — G4701 Insomnia due to medical condition: Secondary | ICD-10-CM

## 2019-06-23 DIAGNOSIS — I10 Essential (primary) hypertension: Secondary | ICD-10-CM | POA: Diagnosis not present

## 2019-06-23 MED ORDER — CARVEDILOL 6.25 MG PO TABS: ORAL_TABLET | ORAL | 0 refills | Status: AC

## 2019-06-23 MED ORDER — LOSARTAN POTASSIUM 100 MG PO TABS: 100.0000 mg | ORAL_TABLET | Freq: Every day | ORAL | 0 refills | Status: AC

## 2019-06-23 MED ORDER — OXYBUTYNIN CHLORIDE ER 10 MG PO TB24: 10.0000 mg | ORAL_TABLET | Freq: Every day | ORAL | 0 refills | Status: DC

## 2019-06-23 MED ORDER — MIRTAZAPINE 15 MG PO TABS: 15.0000 mg | ORAL_TABLET | Freq: Every day | ORAL | 2 refills | Status: AC

## 2019-06-23 MED ORDER — LEVOTHYROXINE SODIUM 112 MCG PO TABS: 112.0000 ug | ORAL_TABLET | Freq: Every morning | ORAL | 0 refills | Status: AC

## 2019-06-23 NOTE — Assessment & Plan Note (Signed)
Home readings 242 systolic  Continue losartan 100 mg qhs,  Increase carvedilol to  9.375 mg  (1.5 tablets)  Bid

## 2019-06-23 NOTE — Assessment & Plan Note (Signed)
Has not increased dose to 200 mcg on sundays only.  Dose explained  100 mcg x 6 days,  200 mcg on sundays.  Until runs out,  Then increase to 112 mcg daily recheck tsh 6 weeks goal TSH is 1.0

## 2019-06-23 NOTE — Progress Notes (Signed)
Telephone  Note  This visit type was conducted due to national recommendations for restrictions regarding the COVID-19 pandemic (e.g. social distancing).  This format is felt to be most appropriate for this patient at this time.  All issues noted in this document were discussed and addressed.  No physical exam was performed (except for noted visual exam findings with Video Visits).   I connected with@ on 06/23/19 at  8:00 AM EST by telephone and verified that I am speaking with the correct person using two identifiers. Location patient: home Location provider: work or home office Persons participating in the virtual visit: patient, provider  I discussed the limitations, risks, security and privacy concerns of performing an evaluation and management service by telephone and the availability of in person appointments. I also discussed with the patient that there may be a patient responsible charge related to this service. The patient expressed understanding and agreed to proceed.   Reason for visit: follow up on multiple conditions  HPI:  33 yr ld female with history of two primary cancers (renal cell CA s/p partial nephrectomy in 2017, neuroendocrine CA of lung diagnosed in 2018 and acquired pancytopenia , transfusion dependent presents for follow up on anemia and hypothyroidisim   Feels "rough."  Some loose stools occurring during the night,  And daytime (3-4 daily)  , not sleeping.  Was transfused on Oct 16 .and spent 12 hours in the ER without food    Peripheral smear done. Has appt with Orlie Dakin moved up to nov 20 for worsening anemia  Thyroid issues:  Has been taking 100 mcg daily since Oct 2  Daily    HAS NOT INCREASED DOSE YET.    OAB on myrbetriq needs alternative drug due to cost.   Htn:   176/77  discussed taking 1.5 tablets of carvedilol bid  (9.5)  And continue losartan 100 mg daily in evening .  Not sleeping well without mirtazipine.  15 mg  On trazodone. Mirtazapine was stopped  secondary to excessive weight gain,  But she has changed her diet and wants to resume it.      ROS: See pertinent positives and negatives per HPI.  Past Medical History:  Diagnosis Date  . Anemia 02/2013   F/U with Dr. Orlie Dakin for Feraheme injections  . Anxiety   . Arthritis    Possible RA - Follow up appt with Dr. Gavin Potters  . Cellulitis of arm, right   . Chest pain 03/26/2013  . Diabetes insipidus (HCC)    On desmopressin  . GERD (gastroesophageal reflux disease)   . H/O seasonal allergies   . H/O transfusion of packed red blood cells 02/2013   3 units PRBC for severe anemia 02/2013  . Herniated disc   . History of kidney stones   . Hyperlipidemia   . Hypertension   . Hyperthyroidism    s/p radiation therapy, now hypothyroidism  . IBS (irritable bowel syndrome)   . Lung cancer (HCC) 06/2017   left  . Migraine   . Pacemaker    a. s/p successful implantation of a Medtronic CapSureFix Novus MRIT SureScan serial # [id number] on 06/28/16 by Dr. Graciela Husbands for sinus node dysfunction.  . Pancreatic cyst   . Paroxysmal A-fib (HCC)   . Renal cell cancer, right (HCC) 10/23/2015   Right partial nephrectomy.    Past Surgical History:  Procedure Laterality Date  . ABDOMINAL HYSTERECTOMY    . BREAST EXCISIONAL BIOPSY Right 2005   neg  . BREAST SURGERY  1990s   Biopsy  . CERVICAL SPINE SURGERY     Bone fusion  . COLON[place]  2008?   [place]  . COLONOSCOPY WITH PROPOFOL N/A 10/15/2016   Procedure: COLONOSCOPY WITH PROPOFOL;  Surgeon: Earline Mayotte, MD;  Location: [facility] ENDOSCOPY;  Service: Endoscopy;  Laterality: N/A;  . EP IMPLANTABLE DEVICE N/A 06/28/2016   Procedure: Pacemaker Implant;  Surgeon: Duke Salvia, MD;  Location: [facility] INVASIVE CV LAB;  Service: Cardiovascular;  Laterality: N/A;  . HAMMER TOE SURGERY    . HERNIA REPAIR Left    Inguinal Hernia Repair  . PORTA CATH INSERTION N/A 07/24/2017   Procedure: PORTA CATH INSERTION;  Surgeon: Annice Needy, MD;  Location:  [facility] INVASIVE CV LAB;  Service: Cardiovascular;  Laterality: N/A;  . ROBOTIC ASSITED PARTIAL NEPHRECTOMY Right 10/23/2015   Procedure: ROBOTIC ASSITED PARTIAL NEPHRECTOMY with intraop ultrasound;  Surgeon: Vanna Scotland, MD;  Location: [facility] ORS;  Service: Urology;  Laterality: Right;  . TUBAL LIGATION      Family History  Problem Relation Age of Onset  . Heart disease Mother   . Heart disease Father   . Diabetes Brother   . Kidney disease Brother   . Stroke Daughter        due to medication reaction  . Breast cancer Cousin   . Prostate cancer Neg Hx   . Hematuria Neg Hx     SOCIAL HX:  reports that she quit smoking about 6 years ago. Her smoking use included cigarettes. She has a 15.00 pack-year smoking history. She has never used smokeless tobacco. She reports that she does not drink alcohol or use drugs.   Current Outpatient Medications:  .  acetaminophen (TYLENOL) 325 MG tablet, Take 2 tablets (650 mg total) by mouth every 6 (six) hours as needed for pain. (Patient taking differently: Take 650 mg by mouth every 6 (six) hours as needed for pain. ), Disp: , Rfl:  .  albuterol (PROVENTIL HFA;VENTOLIN HFA) 108 (90 Base) MCG/ACT inhaler, Inhale 2 puffs into the lungs every 6 (six) hours as needed for wheezing or shortness of breath., Disp: 1 Inhaler, Rfl: 2 .  ALPRAZolam (XANAX) 0.25 MG tablet, TAKE ONE TABLET BY MOUTH TWICE A DAY AS NEEDED FOR ANXIETY (Patient taking differently: Take 0.25 mg by mouth 2 (two) times daily as needed for anxiety. ), Disp: 60 tablet, Rfl: 3 .  brimonidine (ALPHAGAN) 0.2 % ophthalmic solution, Place 1 drop into both eyes 3 (three) times daily. , Disp: , Rfl:  .  Calcium Carbonate-Vitamin D (CALCIUM-CARB 600 + D) 600-125 MG-UNIT TABS, Take 2 tablets by mouth daily., Disp: , Rfl:  .  carvedilol (COREG) 6.25 MG tablet, 1.5 tablets twice daily with meals, Disp: 270 tablet, Rfl: 0 .  cholecalciferol (VITAMIN D) 1000 units tablet, Take 1,000 Units by mouth daily.,  Disp: , Rfl:  .  dorzolamide (TRUSOPT) 2 % ophthalmic solution, Place 1 drop into both eyes 3 (three) times daily. , Disp: , Rfl:  .  ferrous sulfate 325 (65 FE) MG tablet, Take 325 mg by mouth daily with breakfast., Disp: , Rfl:  .  furosemide (LASIX) 20 MG tablet, TAKE ONE-HALF TABLET BY MOUTH DAILY (Patient taking differently: Take 10 mg by mouth daily. ), Disp: 45 tablet, Rfl: 3 .  gabapentin (NEURONTIN) 300 MG capsule, TAKE ONE CAPSULE BY MOUTH THREE TIMES A DAY AS NEEDED FOR SHINGLES PAIN (Patient taking differently: Take 300 mg by mouth 3 (three) times daily. TAKE ONE CAPSULE BY  MOUTH THREE TIMES A DAY AS NEEDED FOR SHINGLES PAIN), Disp: 90 capsule, Rfl: 0 .  hydroxychloroquine (PLAQUENIL) 200 MG tablet, Take 400 mg by mouth daily. , Disp: , Rfl:  .  latanoprost (XALATAN) 0.005 % ophthalmic solution, Place 1 drop into both eyes at bedtime. , Disp: , Rfl:  .  levothyroxine (SYNTHROID) 112 MCG tablet, Take 1 tablet (112 mcg total) by mouth every morning., Disp: 90 tablet, Rfl: 0 .  losartan (COZAAR) 100 MG tablet, Take 1 tablet (100 mg total) by mouth at bedtime., Disp: 90 tablet, Rfl: 0 .  MYRBETRIQ 50 MG TB24 tablet, TAKE ONE TABLET BY MOUTH DAILY (Patient taking differently: Take 50 mg by mouth daily. ), Disp: 30 tablet, Rfl: 4 .  nitroGLYCERIN (NITROLINGUAL) 0.4 MG/SPRAY spray, Place 1 spray under the tongue every 5 (five) minutes x 3 doses as needed for chest pain., Disp: 4.9 g, Rfl: 3 .  Omega-3 Fatty Acids (FISH OIL) 1000 MG CAPS, Take 1,000 mg by mouth daily. , Disp: , Rfl:  .  pantoprazole (PROTONIX) 40 MG tablet, TAKE ONE TABLET BY MOUTH DAILY (Patient taking differently: Take 40 mg by mouth daily. ), Disp: 90 tablet, Rfl: 1 .  PARoxetine (PAXIL) 10 MG tablet, TAKE ONE TABLET BY MOUTH DAILY AFTER DINNER (Patient taking differently: Take 10 mg by mouth daily after supper. ), Disp: 90 tablet, Rfl: 1 .  vitamin C (ASCORBIC ACID) 500 MG tablet, Take 500 mg by mouth daily., Disp: , Rfl:  .   mirtazapine (REMERON) 15 MG tablet, Take 1 tablet (15 mg total) by mouth at bedtime., Disp: 30 tablet, Rfl: 2 .  oxybutynin (DITROPAN-XL) 10 MG 24 hr tablet, Take 1 tablet (10 mg total) by mouth at bedtime., Disp: 30 tablet, Rfl: 0  EXAM:  General appearance: alert, cooperative and articulate.  No audible signs of being in distress  Lungs: not short of breath ,  No cough, speaking in full sentences  Psych: affect normal,dspeech is articulate and non pressured .  Denies suicidal thoughts    ASSESSMENT AND PLAN:  Discussed the following assessment and plan:  Postablative hypothyroidism - Plan: Thyroid Panel With TSH  Essential hypertension  Insomnia due to medical condition  Postablative hypothyroidism Has not increased dose to 200 mcg on sundays only.  Dose explained  100 mcg x 6 days,  200 mcg on sundays.  Until runs out,  Then increase to 112 mcg daily recheck tsh 6 weeks goal TSH is 1.0   Essential hypertension Home readings 170 systolic  Continue losartan 100 mg qhs,  Increase carvedilol to  9.375 mg  (1.5 tablets)  Bid   Insomnia due to medical condition Aggravated by OAB.  Dc trazodone, resume mirtazipine  Trial of oxybutynin instead of myrbetriq    I discussed the assessment and treatment plan with the patient. The patient was provided an opportunity to ask questions and all were answered. The patient agreed with the plan and demonstrated an understanding of the instructions.   The patient was advised to call back or seek an in-person evaluation if the symptoms worsen or if the condition fails to improve as anticipated.  I provided  25 minutes of non-face-to-face time during this encounter reviewing patient's current problems and post surgeries.  Providing counseling on the above mentioned problems , and coordination  of care .  Sherlene Shams, MD

## 2019-06-23 NOTE — Assessment & Plan Note (Signed)
Aggravated by OAB.  Dc trazodone, resume mirtazipine  Trial of oxybutynin instead of myrbetriq

## 2019-06-23 NOTE — Patient Instructions (Addendum)
Increase the carvedilol to 1.5 tablets twice daily  Resume mirtazapine and stop the trazodone  Instead of mybetriq , try oxybutyrin  (IT'S CHEAPER)    Increase your thyroid medication to 200 mcg on Sundays ONLY.  Contineu 100 mcg all other days until you rund out of your current bottle  Your  Refill will be for 112 mcg  ONCE DAILY EVERY DAY  (NOT 2 ON SUNDAYS )  REPEAT THYROID LEVEL IN 6 WEEKS

## 2019-06-25 ENCOUNTER — Inpatient Hospital Stay (HOSPITAL_BASED_OUTPATIENT_CLINIC_OR_DEPARTMENT_OTHER): Payer: Medicare Other | Admitting: Oncology

## 2019-06-25 ENCOUNTER — Ambulatory Visit
Admission: RE | Admit: 2019-06-25 | Discharge: 2019-06-25 | Disposition: A | Discharge status: Home / Self Care | Payer: Medicare Other | Source: Ambulatory Visit | Attending: Internal Medicine | Admitting: Internal Medicine

## 2019-06-25 ENCOUNTER — Inpatient Hospital Stay: Payer: Medicare Other

## 2019-06-25 ENCOUNTER — Other Ambulatory Visit: Payer: Self-pay

## 2019-06-25 ENCOUNTER — Inpatient Hospital Stay: Payer: Medicare Other | Attending: Oncology | Admitting: Oncology

## 2019-06-25 VITALS — BP 143/68 | HR 77 | Temp 98.8°F | Wt 134.8 lb

## 2019-06-25 DIAGNOSIS — F419 Anxiety disorder, unspecified: Secondary | ICD-10-CM | POA: Insufficient documentation

## 2019-06-25 DIAGNOSIS — Z8249 Family history of ischemic heart disease and other diseases of the circulatory system: Secondary | ICD-10-CM | POA: Diagnosis not present

## 2019-06-25 DIAGNOSIS — Z87891 Personal history of nicotine dependence: Secondary | ICD-10-CM | POA: Diagnosis not present

## 2019-06-25 DIAGNOSIS — I70219 Atherosclerosis of native arteries of extremities with intermittent claudication, unspecified extremity: Secondary | ICD-10-CM

## 2019-06-25 DIAGNOSIS — E039 Hypothyroidism, unspecified: Secondary | ICD-10-CM | POA: Insufficient documentation

## 2019-06-25 DIAGNOSIS — D61818 Other pancytopenia: Secondary | ICD-10-CM | POA: Diagnosis not present

## 2019-06-25 DIAGNOSIS — M069 Rheumatoid arthritis, unspecified: Secondary | ICD-10-CM | POA: Diagnosis not present

## 2019-06-25 DIAGNOSIS — Z923 Personal history of irradiation: Secondary | ICD-10-CM | POA: Diagnosis not present

## 2019-06-25 DIAGNOSIS — E785 Hyperlipidemia, unspecified: Secondary | ICD-10-CM | POA: Diagnosis not present

## 2019-06-25 DIAGNOSIS — C641 Malignant neoplasm of right kidney, except renal pelvis: Secondary | ICD-10-CM | POA: Diagnosis not present

## 2019-06-25 DIAGNOSIS — I10 Essential (primary) hypertension: Secondary | ICD-10-CM | POA: Insufficient documentation

## 2019-06-25 DIAGNOSIS — I480 Paroxysmal atrial fibrillation: Secondary | ICD-10-CM | POA: Diagnosis not present

## 2019-06-25 DIAGNOSIS — Z79899 Other long term (current) drug therapy: Secondary | ICD-10-CM | POA: Diagnosis not present

## 2019-06-25 DIAGNOSIS — C7A8 Other malignant neuroendocrine tumors: Secondary | ICD-10-CM | POA: Diagnosis not present

## 2019-06-25 DIAGNOSIS — Z803 Family history of malignant neoplasm of breast: Secondary | ICD-10-CM | POA: Diagnosis not present

## 2019-06-25 DIAGNOSIS — Z1231 Encounter for screening mammogram for malignant neoplasm of breast: Secondary | ICD-10-CM | POA: Diagnosis not present

## 2019-06-25 DIAGNOSIS — D649 Anemia, unspecified: Secondary | ICD-10-CM

## 2019-06-25 LAB — IRON AND TIBC
Iron: 160 ug/dL (ref 28–170)
Saturation Ratios: 47 % — ABNORMAL HIGH (ref 10.4–31.8)
TIBC: 344 ug/dL (ref 250–450)
UIBC: 184 ug/dL

## 2019-06-25 LAB — CBC WITH DIFFERENTIAL/PLATELET
Abs Immature Granulocytes: 0.5 10*3/uL — ABNORMAL HIGH (ref 0.00–0.07)
Band Neutrophils: 13 %
Basophils Absolute: 0 10*3/uL (ref 0.0–0.1)
Basophils Relative: 0 %
Blasts: 1 %
Eosinophils Absolute: 0 10*3/uL (ref 0.0–0.5)
Eosinophils Relative: 1 %
HCT: 28.2 % — ABNORMAL LOW (ref 36.0–46.0)
Hemoglobin: 8.6 g/dL — ABNORMAL LOW (ref 12.0–15.0)
Lymphocytes Relative: 27 %
Lymphs Abs: 1.1 10*3/uL (ref 0.7–4.0)
MCH: 28.8 pg (ref 26.0–34.0)
MCHC: 30.5 g/dL (ref 30.0–36.0)
MCV: 94.3 fL (ref 80.0–100.0)
Metamyelocytes Relative: 2 %
Monocytes Absolute: 0.6 10*3/uL (ref 0.1–1.0)
Monocytes Relative: 14 %
Myelocytes: 11 %
Neutro Abs: 1.8 10*3/uL (ref 1.7–7.7)
Neutrophils Relative %: 31 %
Platelets: 86 10*3/uL — ABNORMAL LOW (ref 150–400)
RBC: 2.99 MIL/uL — ABNORMAL LOW (ref 3.87–5.11)
RDW: 20.1 % — ABNORMAL HIGH (ref 11.5–15.5)
Smear Review: DECREASED
WBC Morphology: ABNORMAL
WBC: 4.1 10*3/uL (ref 4.0–10.5)
nRBC: 1.4 % — ABNORMAL HIGH (ref 0.0–0.2)

## 2019-06-25 LAB — FERRITIN: Ferritin: 124 ng/mL (ref 11–307)

## 2019-06-25 NOTE — Progress Notes (Signed)
Pt here for follow up. No specific questions or concerns.

## 2019-06-25 NOTE — Progress Notes (Signed)
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  Worsening anemia.  HISTORY OF PRESENT ILLNESS: Patient returns to clinic today as an acute add-on after finding a declining hemoglobin and requiring blood transfusion in the ER last week.  She continues to have weakness and fatigue, but states she feels mildly improved.  She otherwise feels well.  She does not complain of shortness of breath today.  She denies any recent fevers or illnesses.  She does not complain of pain today. She has no neurologic complaints. She has a good appetite and denies weight loss.  She denies any chest pain, cough, or hemoptysis.  She denies any nausea, vomiting, constipation, or diarrhea.  She has no urinary complaints.  Patient offers no further specific complaints today.  REVIEW OF SYSTEMS:    Review of Systems  Constitutional: Positive for malaise/fatigue. Negative for fever and weight loss.  HENT: Negative.   Eyes: Negative.  Negative for blurred vision, double vision and pain.  Respiratory: Negative.  Negative for cough and shortness of breath.   Cardiovascular: Negative.  Negative for chest pain and leg swelling.  Gastrointestinal: Negative.  Negative for abdominal pain, diarrhea and nausea.  Genitourinary: Negative.  Negative for dysuria.  Musculoskeletal: Negative.  Negative for falls.  Skin: Negative.  Negative for rash.  Neurological: Positive for sensory change and  weakness. Negative for focal weakness and headaches.  Psychiatric/Behavioral: Negative.  The patient is not nervous/anxious.   All other systems reviewed and are negative.   PAST MEDICAL HISTORY: Past Medical History:  Diagnosis Date  . Anemia 02/2013   F/U with Dr. Orlie Dakin for Feraheme injections  . Anxiety   . Arthritis    Possible RA - Follow up appt with Dr. Gavin Potters  . Cellulitis of arm, right   . Chest pain 03/26/2013  . Diabetes insipidus (HCC)    On desmopressin  . GERD (gastroesophageal reflux disease)   . H/O seasonal allergies   . H/O transfusion of packed red blood cells 02/2013   3 units PRBC for severe anemia 02/2013  . Herniated disc   . History of kidney stones   . Hyperlipidemia   . Hypertension   . Hyperthyroidism    s/p radiation therapy, now hypothyroidism  . IBS (irritable bowel syndrome)   . Lung cancer (HCC) 06/2017   left  . Migraine   . Pacemaker    a. s/p successful implantation of a Medtronic CapSureFix Novus MRIT SureScan serial # [id number] on 06/28/16 by Dr. Graciela Husbands for sinus node dysfunction.  . Pancreatic cyst   . Paroxysmal A-fib (HCC)   . Renal cell cancer, right (HCC) 10/23/2015   Right partial nephrectomy.    PAST SURGICAL HISTORY: Past Surgical History:  Procedure Laterality Date  . ABDOMINAL HYSTERECTOMY    . BREAST EXCISIONAL BIOPSY Right 2005   neg  . BREAST SURGERY  1990s   Biopsy  . CERVICAL SPINE SURGERY     Bone fusion  . COL[place]  2008?   [place]  .  COLONOSCOPY WITH PROPOFOL N/A 10/15/2016   Procedure: COLONOSCOPY WITH PROPOFOL;  Surgeon: Earline Mayotte, MD;  Location: [facility] ENDOSCOPY;  Service: Endoscopy;  Laterality: N/A;  . EP IMPLANTABLE DEVICE N/A 06/28/2016   Procedure: Pacemaker Implant;  Surgeon: Duke Salvia, MD;  Location: [facility] INVASIVE CV LAB;  Service: Cardiovascular;  Laterality: N/A;  . HAMMER TOE SURGERY    . HERNIA REPAIR Left    Inguinal Hernia Repair  . PORTA CATH INSERTION N/A 07/24/2017    Procedure: PORTA CATH INSERTION;  Surgeon: Annice Needy, MD;  Location: [facility] INVASIVE CV LAB;  Service: Cardiovascular;  Laterality: N/A;  . ROBOTIC ASSITED PARTIAL NEPHRECTOMY Right 10/23/2015   Procedure: ROBOTIC ASSITED PARTIAL NEPHRECTOMY with intraop ultrasound;  Surgeon: Vanna Scotland, MD;  Location: [facility] ORS;  Service: Urology;  Laterality: Right;  . TUBAL LIGATION      FAMILY HISTORY Family History  Problem Relation Age of Onset  . Heart disease Mother   . Heart disease Father   . Diabetes Brother   . Kidney disease Brother   . Stroke Daughter        due to medication reaction  . Breast cancer Cousin   . Prostate cancer Neg Hx   . Hematuria Neg Hx     ADVANCED DIRECTIVES:  No flowsheet data found.  HEALTH MAINTENANCE: Social History   Tobacco Use  . Smoking status: Former Smoker    Packs/day: 0.50    Years: 30.00    Pack years: 15.00    Types: Cigarettes    Quit date: 12/03/2012    Years since quitting: 6.5  . Smokeless tobacco: Never Used  Substance Use Topics  . Alcohol use: No    Alcohol/week: 0.0 standard drinks  . Drug use: No     Allergies  Allergen Reactions  . Metrizamide     Other reaction(s): Other (See Comments)  . Adhesive [Tape] Other (See Comments)    "irritate skin"  . Augmentin [Amoxicillin-Pot Clavulanate] Diarrhea    Severe diarrhea  . Codeine Nausea Only  . Morphine And Related Other (See Comments)    Shut down her organs  . Other     Skin irritation  . Tetanus Toxoid Other (See Comments)    REACTION: ARM SWELLING,REDNESS  . Tramadol Nausea And Vomiting    Current Outpatient Medications  Medication Sig Dispense Refill  . acetaminophen (TYLENOL) 325 MG tablet Take 2 tablets (650 mg total) by mouth every 6 (six) hours as needed for pain. (Patient taking differently: Take 650 mg by mouth every 6 (six) hours as needed for pain. )    . albuterol (PROVENTIL HFA;VENTOLIN HFA) 108 (90 Base) MCG/ACT inhaler Inhale 2 puffs into the lungs  every 6 (six) hours as needed for wheezing or shortness of breath. 1 Inhaler 2  . ALPRAZolam (XANAX) 0.25 MG tablet TAKE ONE TABLET BY MOUTH TWICE A DAY AS NEEDED FOR ANXIETY (Patient taking differently: Take 0.25 mg by mouth 2 (two) times daily as needed for anxiety. ) 60 tablet 3  . brimonidine (ALPHAGAN) 0.2 % ophthalmic solution Place 1 drop into both eyes 3 (three) times daily.     . Calcium Carbonate-Vitamin D (CALCIUM-CARB 600 + D) 600-125 MG-UNIT TABS Take 2 tablets by mouth daily.    . carvedilol (COREG) 6.25 MG tablet 1.5 tablets twice daily with meals 270 tablet 0  . cholecalciferol (VITAMIN D) 1000 units tablet Take 1,000 Units by mouth daily.    . dorzolamide (TRUSOPT) 2 % ophthalmic solution Place  1 drop into both eyes 3 (three) times daily.     . ferrous sulfate 325 (65 FE) MG tablet Take 325 mg by mouth daily with breakfast.    . furosemide (LASIX) 20 MG tablet TAKE ONE-HALF TABLET BY MOUTH DAILY (Patient taking differently: Take 10 mg by mouth daily. ) 45 tablet 3  . gabapentin (NEURONTIN) 300 MG capsule TAKE ONE CAPSULE BY MOUTH THREE TIMES A DAY AS NEEDED FOR SHINGLES PAIN (Patient taking differently: Take 300 mg by mouth 3 (three) times daily. TAKE ONE CAPSULE BY MOUTH THREE TIMES A DAY AS NEEDED FOR SHINGLES PAIN) 90 capsule 0  . hydroxychloroquine (PLAQUENIL) 200 MG tablet Take 400 mg by mouth daily.     Marland Kitchen latanoprost (XALATAN) 0.005 % ophthalmic solution Place 1 drop into both eyes at bedtime.     Marland Kitchen levothyroxine (SYNTHROID) 112 MCG tablet Take 1 tablet (112 mcg total) by mouth every morning. 90 tablet 0  . losartan (COZAAR) 100 MG tablet Take 1 tablet (100 mg total) by mouth at bedtime. 90 tablet 0  . mirtazapine (REMERON) 15 MG tablet Take 1 tablet (15 mg total) by mouth at bedtime. 30 tablet 2  . MYRBETRIQ 50 MG TB24 tablet TAKE ONE TABLET BY MOUTH DAILY (Patient taking differently: Take 50 mg by mouth daily. ) 30 tablet 4  . nitroGLYCERIN (NITROLINGUAL) 0.4 MG/SPRAY spray  Place 1 spray under the tongue every 5 (five) minutes x 3 doses as needed for chest pain. 4.9 g 3  . Omega-3 Fatty Acids (FISH OIL) 1000 MG CAPS Take 1,000 mg by mouth daily.     . pantoprazole (PROTONIX) 40 MG tablet TAKE ONE TABLET BY MOUTH DAILY (Patient taking differently: Take 40 mg by mouth daily. ) 90 tablet 1  . PARoxetine (PAXIL) 10 MG tablet TAKE ONE TABLET BY MOUTH DAILY AFTER DINNER (Patient taking differently: Take 10 mg by mouth daily after supper. ) 90 tablet 1  . vitamin C (ASCORBIC ACID) 500 MG tablet Take 500 mg by mouth daily.    Marland Kitchen oxybutynin (DITROPAN-XL) 10 MG 24 hr tablet Take 1 tablet (10 mg total) by mouth at bedtime. (Patient not taking: Reported on 06/25/2019) 30 tablet 0   No current facility-administered medications for this visit.     OBJECTIVE:  Vitals:   06/25/19 1028  BP: (!) 143/68  Pulse: 77  Temp: 98.8 F (37.1 C)  SpO2: 100%     Body mass index is 24.66 kg/m.    ECOG FS:0 - Asymptomatic  General: Well-developed, well-nourished, no acute distress. Eyes: Pink conjunctiva, anicteric sclera. HEENT: Normocephalic, moist mucous membranes. Lungs: Clear to auscultation bilaterally. Heart: Regular rate and rhythm. No rubs, murmurs, or gallops. Abdomen: Soft, nontender, nondistended. No organomegaly noted, normoactive bowel sounds. Musculoskeletal: No edema, cyanosis, or clubbing. Neuro: Alert, answering all questions appropriately. Cranial nerves grossly intact. Skin: No rashes or petechiae noted. Psych: Normal affect.  LAB RESULTS:  Clinical Support on 06/25/2019  Component Date Value Ref Range Status  . WBC 06/25/2019 4.1  4.0 - 10.5 K/uL Final  . RBC 06/25/2019 2.99* 3.87 - 5.11 MIL/uL Final  . Hemoglobin 06/25/2019 8.6* 12.0 - 15.0 g/dL Final  . HCT 16/05/9603 28.2* 36.0 - 46.0 % Final  . MCV 06/25/2019 94.3  80.0 - 100.0 fL Final  . MCH 06/25/2019 28.8  26.0 - 34.0 pg Final  . MCHC 06/25/2019 30.5  30.0 - 36.0 g/dL Final  . RDW 54/04/8118  20.1* 11.5 - 15.5 % Final  . Platelets  06/25/2019 86* 150 - 400 K/uL Final  . nRBC 06/25/2019 1.4* 0.0 - 0.2 % Final  . Neutrophils Relative % 06/25/2019 31  % Final  . Neutro Abs 06/25/2019 1.8  1.7 - 7.7 K/uL Final  . Band Neutrophils 06/25/2019 13  % Final  . Lymphocytes Relative 06/25/2019 27  % Final  . Lymphs Abs 06/25/2019 1.1  0.7 - 4.0 K/uL Final  . Monocytes Relative 06/25/2019 14  % Final  . Monocytes Absolute 06/25/2019 0.6  0.1 - 1.0 K/uL Final  . Eosinophils Relative 06/25/2019 1  % Final  . Eosinophils Absolute 06/25/2019 0.0  0.0 - 0.5 K/uL Final  . Basophils Relative 06/25/2019 0  % Final  . Basophils Absolute 06/25/2019 0.0  0.0 - 0.1 K/uL Final  . WBC Morphology 06/25/2019 Abnormal lymphocytes present   Final   SEE PATHOLOGIST REVEIW FROM 11.13.2020  . RBC Morphology 06/25/2019 MIXED RBC POPULATION WITH FEW POLYCHROMTOPHILIC RBCS, TEARDROP CELLS AND OCC NRBCS NOTED   Final  . Smear Review 06/25/2019 PLATELETS APPEAR DECREASED   Final   PLATELETS VARY IN SIZE WITH FEW LARGE AND GIANT PLATELETS NOTED  . Metamyelocytes Relative 06/25/2019 2  % Final  . Myelocytes 06/25/2019 11  % Final  . Blasts 06/25/2019 1  % Final  . Abs Immature Granulocytes 06/25/2019 0.50* 0.00 - 0.07 K/uL Final   Performed at [facility], [address] [place], [place] [address]  . Iron 06/25/2019 160  28 - 170 ug/dL Final  . TIBC 60/45/4098 344  250 - 450 ug/dL Final  . Saturation Ratios 06/25/2019 47* 10.4 - 31.8 % Final  . UIBC 06/25/2019 184  ug/dL Final   Performed at [facility], [address], [place], [place] [address]  . Ferritin 06/25/2019 124  11 - 307 ng/mL Final   Performed at [facility], [address] [place], [place] [address]       STUDIES: Dg Chest Portable 1 View  Result Date: 06/18/2019 CLINICAL DATA:  Weakness EXAM: PORTABLE CHEST 1 VIEW COMPARISON:  12/31/2017 FINDINGS: Left pacer and right Port-A-Cath remain in place, unchanged.  Cardiomegaly. Left lower lobe airspace opacity could reflect atelectasis or infiltrate. Right lung clear. No acute bony abnormality. IMPRESSION: Cardiomegaly. Left lower lobe atelectasis or infiltrate. Electronically Signed   By: Charlett Nose M.D.   On: 06/18/2019 22:53   Mm 3d Screen Breast Bilateral  Result Date: 06/25/2019 CLINICAL DATA:  Screening. EXAM: DIGITAL SCREENING BILATERAL MAMMOGRAM WITH TOMO AND CAD COMPARISON:  Previous exam(s). ACR Breast Density Category c: The breast tissue is heterogeneously dense, which may obscure small masses. FINDINGS: There are no findings suspicious for malignancy. Images were processed with CAD. IMPRESSION: No mammographic evidence of malignancy. A result letter of this screening mammogram will be mailed directly to the patient. RECOMMENDATION: Screening mammogram in one year. (Code:SM-B-01Y) BI-RADS CATEGORY  1: Negative. Electronically Signed   By: Annia Belt M.D.   On: 06/25/2019 11:18   Vas [place] Vanice Sarah With/wo Tbi  Result Date: 05/28/2019 LOWER EXTREMITY DOPPLER STUDY Indications: Claudication, and peripheral artery disease.  Comparison Study: 05/27/2018 Performing Technologist: Debbe Bales RVS  Examination Guidelines: A complete evaluation includes at minimum, Doppler waveform signals and systolic blood pressure reading at the level of bilateral brachial, anterior tibial, and posterior tibial arteries, when vessel segments are accessible. Bilateral testing is considered an integral part of a complete examination. Photoelectric Plethysmograph (PPG) waveforms and toe systolic pressure readings are included as required and additional duplex testing  as needed. Limited examinations for reoccurring indications may be performed as noted.  ABI Findings: +---------+------------------+-----+----------+--------+ Right    Rt Pressure (mmHg)IndexWaveform  Comment  +---------+------------------+-----+----------+--------+ Brachial 171                                        +---------+------------------+-----+----------+--------+ ATA      94                0.53 monophasic         +---------+------------------+-----+----------+--------+ PTA      135               0.76 biphasic           +---------+------------------+-----+----------+--------+ PERO     145               0.81 biphasic           +---------+------------------+-----+----------+--------+ Great Toe47                0.26 Abnormal           +---------+------------------+-----+----------+--------+ +---------+------------------+-----+----------+-------+ Left     Lt Pressure (mmHg)IndexWaveform  Comment +---------+------------------+-----+----------+-------+ Brachial 178                                      +---------+------------------+-----+----------+-------+ ATA      152               0.85 biphasic          +---------+------------------+-----+----------+-------+ PTA      20                0.11 monophasic        +---------+------------------+-----+----------+-------+ PERO     123               0.69 monophasic        +---------+------------------+-----+----------+-------+ Great Toe91                0.51 Abnormal          +---------+------------------+-----+----------+-------+ +-------+-----------+-----------+------------+------------+ ABI/TBIToday's ABIToday's TBIPrevious ABIPrevious TBI +-------+-----------+-----------+------------+------------+ Right  .81        .26        .96         .42          +-------+-----------+-----------+------------+------------+ Left   .85        .51        1.13        .81          +-------+-----------+-----------+------------+------------+ Bilateral ABIs appear decreased compared to prior study on 05/27/2018. Bilateral TBIs appear decreased compared to prior study on 05/27/2018.  Summary: Right: Resting right ankle-brachial index indicates mild right lower extremity arterial disease. The right toe-brachial index  is abnormal. Left: Resting left ankle-brachial index indicates mild left lower extremity arterial disease. The left toe-brachial index is abnormal. The Left PTA appears to be diminished.  *See table(s) above for measurements and observations.  Electronically signed by Festus Barren MD on 05/28/2019 at 1:18:32 PM.    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  Worsening anemia.  Plan:  1.  Worsening anemia: Patient's hemoglobin has trended down requiring 1 unit of packed red blood cells in the emergency room last week.  It  has now improved and is 8.6 today.  The remainder of her laboratory work including iron stores are within normal limits.  She does not require additional transfusion today.  She has agreed to repeat bone marrow biopsy to assess for underlying etiology.  Return to clinic approximately 1 week after her biopsy to discuss the results and additional transfusion if necessary. 2. 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  Patient completed cycle 3 of carboplatinum and etoposide on September 19, 2017.  She also completed XRT.  Given her persistent pancytopenia, treatment was discontinued.  Patient's most recent CT scan on January 13, 2019 was reviewed independently and reported as above with no obvious evidence of recurrent or progressive disease.  Patient has been instructed to keep her previously scheduled follow-up CT scan on July 22, 2019 for further evaluation.  3.  Pancytopenia: Repeat bone marrow biopsy as above for worsening anemia.  Previously, bone marrow biopsy on October 23, 2015 revealed no evidence of MDS or other abnormality.  It is possible it is related to her Plaquenil or rheumatoid arthritis. 3. Renal cell carcinoma of the right kidney: Stage T1a N0 M0.  Patient underwent partial nephrectomy on October 23, 2015.  Repeat imaging as above.  Patient  reports she has been discharged from urology clinic. 4.  Rheumatoid arthritis: Continue Plaquenil. Continue follow-up per rheumatology.   I spent a total of 30 minutes face-to-face with the patient of which greater than 50% of the visit was spent in counseling and coordination of care as detailed above.   Patient expressed understanding and was in agreement with this plan. She also understands that She can call clinic at any time with any questions, concerns, or complaints.    Jeralyn Ruths, MD   06/25/2019 12:58 PM

## 2019-06-28 ENCOUNTER — Other Ambulatory Visit: Payer: Self-pay | Admitting: Oncology

## 2019-06-28 DIAGNOSIS — D61818 Other pancytopenia: Secondary | ICD-10-CM

## 2019-06-29 ENCOUNTER — Other Ambulatory Visit: Payer: Self-pay | Admitting: Internal Medicine

## 2019-07-02 ENCOUNTER — Other Ambulatory Visit: Payer: Self-pay | Admitting: Physician Assistant

## 2019-07-05 ENCOUNTER — Other Ambulatory Visit: Payer: Self-pay

## 2019-07-05 ENCOUNTER — Ambulatory Visit
Admission: RE | Admit: 2019-07-05 | Discharge: 2019-07-05 | Disposition: A | Discharge status: Home / Self Care | Payer: Medicare Other | Source: Ambulatory Visit | Attending: Oncology | Admitting: Oncology

## 2019-07-05 DIAGNOSIS — D61818 Other pancytopenia: Secondary | ICD-10-CM

## 2019-07-05 DIAGNOSIS — Z79899 Other long term (current) drug therapy: Secondary | ICD-10-CM | POA: Diagnosis not present

## 2019-07-05 DIAGNOSIS — K219 Gastro-esophageal reflux disease without esophagitis: Secondary | ICD-10-CM | POA: Diagnosis not present

## 2019-07-05 DIAGNOSIS — E232 Diabetes insipidus: Secondary | ICD-10-CM | POA: Diagnosis not present

## 2019-07-05 DIAGNOSIS — I10 Essential (primary) hypertension: Secondary | ICD-10-CM | POA: Diagnosis not present

## 2019-07-05 DIAGNOSIS — E039 Hypothyroidism, unspecified: Secondary | ICD-10-CM | POA: Insufficient documentation

## 2019-07-05 DIAGNOSIS — F419 Anxiety disorder, unspecified: Secondary | ICD-10-CM | POA: Insufficient documentation

## 2019-07-05 DIAGNOSIS — Z87891 Personal history of nicotine dependence: Secondary | ICD-10-CM | POA: Insufficient documentation

## 2019-07-05 DIAGNOSIS — E785 Hyperlipidemia, unspecified: Secondary | ICD-10-CM | POA: Insufficient documentation

## 2019-07-05 DIAGNOSIS — K589 Irritable bowel syndrome without diarrhea: Secondary | ICD-10-CM | POA: Diagnosis not present

## 2019-07-05 DIAGNOSIS — Z7901 Long term (current) use of anticoagulants: Secondary | ICD-10-CM | POA: Diagnosis not present

## 2019-07-05 DIAGNOSIS — M1990 Unspecified osteoarthritis, unspecified site: Secondary | ICD-10-CM | POA: Diagnosis not present

## 2019-07-05 DIAGNOSIS — I480 Paroxysmal atrial fibrillation: Secondary | ICD-10-CM | POA: Insufficient documentation

## 2019-07-05 LAB — CBC WITH DIFFERENTIAL/PLATELET
Abs Immature Granulocytes: 0.4 10*3/uL — ABNORMAL HIGH (ref 0.00–0.07)
Basophils Absolute: 0 10*3/uL (ref 0.0–0.1)
Basophils Relative: 0 %
Blasts: 1 %
Eosinophils Absolute: 0 10*3/uL (ref 0.0–0.5)
Eosinophils Relative: 1 %
HCT: 23.8 % — ABNORMAL LOW (ref 36.0–46.0)
Hemoglobin: 7.2 g/dL — ABNORMAL LOW (ref 12.0–15.0)
Lymphocytes Relative: 41 %
Lymphs Abs: 1.8 10*3/uL (ref 0.7–4.0)
MCH: 28.7 pg (ref 26.0–34.0)
MCHC: 30.3 g/dL (ref 30.0–36.0)
MCV: 94.8 fL (ref 80.0–100.0)
Metamyelocytes Relative: 1 %
Monocytes Absolute: 0.4 10*3/uL (ref 0.1–1.0)
Monocytes Relative: 9 %
Myelocytes: 8 %
Neutro Abs: 1.8 10*3/uL (ref 1.7–7.7)
Neutrophils Relative %: 39 %
Platelets: 82 10*3/uL — ABNORMAL LOW (ref 150–400)
RBC: 2.51 MIL/uL — ABNORMAL LOW (ref 3.87–5.11)
RDW: 20.2 % — ABNORMAL HIGH (ref 11.5–15.5)
WBC: 4.5 10*3/uL (ref 4.0–10.5)
nRBC: 2.4 % — ABNORMAL HIGH (ref 0.0–0.2)

## 2019-07-05 LAB — PROTIME-INR
INR: 1.2 (ref 0.8–1.2)
Prothrombin Time: 15.1 seconds (ref 11.4–15.2)

## 2019-07-05 MED ORDER — MIDAZOLAM HCL 5 MG/5ML IJ SOLN
INTRAMUSCULAR | Status: AC
  Filled 2019-07-05: qty 5

## 2019-07-05 MED ORDER — MIDAZOLAM HCL 5 MG/5ML IJ SOLN
INTRAMUSCULAR | Status: AC | PRN
  Administered 2019-07-05: 2 mg via INTRAVENOUS

## 2019-07-05 MED ORDER — HEPARIN SOD (PORK) LOCK FLUSH 100 UNIT/ML IV SOLN
300.0000 [IU] | Freq: Once | INTRAVENOUS | Status: AC
  Administered 2019-07-05: 11:00:00 300 [IU]
  Filled 2019-07-05: qty 3

## 2019-07-05 MED ORDER — SODIUM CHLORIDE 0.9 % IV SOLN
INTRAVENOUS | Status: DC
  Administered 2019-07-05: 08:00:00 via INTRAVENOUS

## 2019-07-05 MED ORDER — MIDAZOLAM HCL 2 MG/2ML IJ SOLN
INTRAMUSCULAR | Status: AC | PRN
  Administered 2019-07-05 (×2): 1 mg via INTRAVENOUS

## 2019-07-05 MED ORDER — HEPARIN SOD (PORK) LOCK FLUSH 100 UNIT/ML IV SOLN
INTRAVENOUS | Status: AC
  Administered 2019-07-05: 300 [IU]
  Filled 2019-07-05: qty 5

## 2019-07-05 MED ORDER — HEPARIN SOD (PORK) LOCK FLUSH 100 UNIT/ML IV SOLN
INTRAVENOUS | Status: AC
  Filled 2019-07-05: qty 5

## 2019-07-05 NOTE — H&P (Signed)
Chief Complaint: Patient was seen in consultation today for No chief complaint on file.  at the request of Finnegan,Timothy J  Referring Physician(s): Finnegan,Timothy J  Supervising Physician: Register    History of Present Illness: Caroline Nichols is a 78 y.o. female  In for bone marrow bx to evaluate anemia.  Past Medical History:  Diagnosis Date   Anemia 02/2013   F/U with Dr. Grayland Ormond for Feraheme injections   Anxiety    Arthritis    Possible RA - Follow up appt with Dr. Jefm Bryant   Cellulitis of arm, right    Chest pain 03/26/2013   Diabetes insipidus ([place])    On desmopressin   GERD (gastroesophageal reflux disease)    H/O seasonal allergies    H/O transfusion of packed red blood cells 02/2013   3 units PRBC for severe anemia 02/2013   Herniated disc    History of kidney stones    Hyperlipidemia    Hypertension    Hyperthyroidism    s/p radiation therapy, now hypothyroidism   IBS (irritable bowel syndrome)    Lung cancer ([place]) 06/2017   left   Migraine    Pacemaker    a. s/p successful implantation of a Medtronic CapSureFix Novus MRI SureScan serial # [id number] on 06/28/16 by Dr. Caryl Comes for sinus node dysfunction.   Pancreatic cyst    Paroxysmal A-fib [facility])    Renal cell cancer, right ([place]) 10/23/2015   Right partial nephrectomy.    Past Surgical History:  Procedure Laterality Date   ABDOMINAL HYSTERECTOMY     BREAST EXCISIONAL BIOPSY Right 2005   neg   BREAST SURGERY  1990s   Biopsy   CERVICAL SPINE SURGERY     Bone fusion   COLONOSCOPY  2008?   [place]   COLONOSCOPY WITH PROPOFOL N/A 10/15/2016   Procedure: COLONOSCOPY WITH PROPOFOL;  Surgeon: Robert Bellow, MD;  Location: [facility] ENDOSCOPY;  Service: Endoscopy;  Laterality: N/A;   EP IMPLANTABLE DEVICE N/A 06/28/2016   Procedure: Pacemaker Implant;  Surgeon: Deboraha Sprang, MD;  Location: [place] CV LAB;  Service: Cardiovascular;  Laterality: N/A;   HAMMER  TOE SURGERY     HERNIA REPAIR Left    Inguinal Hernia Repair   PORTA CATH INSERTION N/A 07/24/2017   Procedure: PORTA CATH INSERTION;  Surgeon: Algernon Huxley, MD;  Location: [place] CV LAB;  Service: Cardiovascular;  Laterality: N/A;   ROBOTIC ASSITED PARTIAL NEPHRECTOMY Right 10/23/2015   Procedure: ROBOTIC ASSITED PARTIAL NEPHRECTOMY with intraop ultrasound;  Surgeon: Hollice Espy, MD;  Location: [facility] ORS;  Service: Urology;  Laterality: Right;   TUBAL LIGATION      Allergies: Metrizamide, Adhesive [tape], Augmentin [amoxicillin-pot clavulanate], Codeine, Morphine and related, Other, Tetanus toxoid, and Tramadol  Medications: Prior to Admission medications   Medication Sig Start Date End Date Taking? Authorizing Provider  acetaminophen (TYLENOL) 325 MG tablet Take 2 tablets (650 mg total) by mouth every 6 (six) hours as needed for pain. Patient taking differently: Take 650 mg by mouth every 6 (six) hours as needed for pain.  01/04/13  Yes Hongalgi, Lenis Dickinson, MD  albuterol (PROVENTIL HFA;VENTOLIN HFA) 108 (90 Base) MCG/ACT inhaler Inhale 2 puffs into the lungs every 6 (six) hours as needed for wheezing or shortness of breath. 12/23/17  Yes Fritzi Mandes, MD  ALPRAZolam (XANAX) 0.25 MG tablet TAKE ONE TABLET BY MOUTH TWICE A DAY AS NEEDED FOR ANXIETY Patient taking differently: Take 0.25 mg by mouth 2 (two) times  daily as needed for anxiety.  12/30/18  Yes [name], MD  brimonidine (ALPHAGAN) 0.2 % ophthalmic solution Place 1 drop into both eyes 3 (three) times daily.  10/31/14  Yes [provider]  Calcium Carbonate-Vitamin D (CALCIUM-CARB 600 + D) 600-125 MG-UNIT TABS Take 2 tablets by mouth daily.   Yes [provider]  carvedilol (COREG) 6.25 MG tablet 1.5 tablets twice daily with meals 06/23/19  Yes [name], MD  cholecalciferol (VITAMIN D) 1000 units tablet Take 1,000 Units by mouth daily.   Yes [provider]  dorzolamide (TRUSOPT) 2 %  ophthalmic solution Place 1 drop into both eyes 3 (three) times daily.    Yes [provider]  ferrous sulfate 325 (65 FE) MG tablet Take 325 mg by mouth daily with breakfast.   Yes [provider]  furosemide (LASIX) 20 MG tablet TAKE ONE-HALF TABLET BY MOUTH DAILY Patient taking differently: Take 10 mg by mouth daily.  05/13/19  Yes [name], MD  gabapentin (NEURONTIN) 300 MG capsule TAKE ONE CAPSULE BY MOUTH THREE TIMES A DAY AS NEEDED FOR SHINGLES PAIN 06/30/19  Yes [name], MD  hydroxychloroquine (PLAQUENIL) 200 MG tablet Take 400 mg by mouth daily.  12/11/15  Yes [provider]  latanoprost (XALATAN) 0.005 % ophthalmic solution Place 1 drop into both eyes at bedtime.  01/02/16  Yes [provider]  levothyroxine (SYNTHROID) 112 MCG tablet Take 1 tablet (112 mcg total) by mouth every morning. 06/23/19  Yes [name], MD  losartan (COZAAR) 100 MG tablet Take 1 tablet (100 mg total) by mouth at bedtime. 06/23/19  Yes [name], MD  mirtazapine (REMERON) 15 MG tablet Take 1 tablet (15 mg total) by mouth at bedtime. 06/23/19  Yes [name], MD  MYRBETRIQ 50 MG TB24 tablet TAKE ONE TABLET BY MOUTH DAILY Patient taking differently: Take 50 mg by mouth daily.  01/22/19  Yes Hollice Espy, MD  nitroGLYCERIN (NITROLINGUAL) 0.4 MG/SPRAY spray Place 1 spray under the tongue every 5 (five) minutes x 3 doses as needed for chest pain. 12/11/18  Yes Gollan, Kathlene November, MD  pantoprazole (PROTONIX) 40 MG tablet TAKE ONE TABLET BY MOUTH DAILY Patient taking differently: Take 40 mg by mouth daily.  02/15/19  Yes [name], MD  PARoxetine (PAXIL) 10 MG tablet TAKE ONE TABLET BY MOUTH DAILY AFTER DINNER Patient taking differently: Take 10 mg by mouth daily after supper.  03/10/19  Yes [name], MD  vitamin C (ASCORBIC ACID) 500 MG tablet Take 500 mg by mouth daily.   Yes [provider]  Omega-3 Fatty Acids (FISH OIL) 1000 MG CAPS  Take 1,000 mg by mouth daily.     [provider]  oxybutynin (DITROPAN-XL) 10 MG 24 hr tablet Take 1 tablet (10 mg total) by mouth at bedtime. Patient not taking: Reported on 06/25/2019 06/23/19   [name], MD     Family History  Problem Relation Age of Onset   Heart disease Mother    Heart disease Father    Diabetes Brother    Kidney disease Brother    Stroke Daughter        due to medication reaction   Breast cancer Cousin    Prostate cancer Neg Hx    Hematuria Neg Hx     Social History   Socioeconomic History   Marital status: Widowed    Spouse name: Not on file   Number  of children: 4   Years of education: 12   Highest education level: Not on file  Occupational History   Occupation: Textiles    Comment: Retired   Occupation: Engineer, 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  Scientist, product/process development strain: Not on file   Food insecurity    Worry: Not on file    Inability: Not on Lexicographer needs    Medical: Not on file    Non-medical: Not on file  Tobacco Use   Smoking status: Former Smoker    Packs/day: 0.50    Years: 30.00    Pack years: 15.00    Types: Cigarettes    Quit date: 12/03/2012    Years since quitting: 6.5   Smokeless tobacco: Never Used  Substance and Sexual Activity   Alcohol use: No    Alcohol/week: 0.0 standard drinks   Drug use: No   Sexual activity: Never  Lifestyle   Physical activity    Days per week: Not on file    Minutes per session: Not on file   Stress: Not on file  Relationships   Social connections    Talks on phone: Not on file    Gets together: Not on file    Attends religious service: Not on file    Active member of club or organization: Not on file    Attends meetings of clubs or organizations: Not on file    Relationship status: Not on file  Other Topics Concern   Not on file  Social History Narrative   Ms. Bihm was born and reared in [place]. She is a widow since 73. She was married for 48 years. They had 4 children (daughters). She is currently leaving at [facility] since June. 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 She enjoys shopping. She also enjoys music, word puzzles and she loves spending time with her family. She loves attending church. One of her favorite things is wearing hats. She absolutely loves hats!    Review of Systems: A 12 point ROS discussed and pertinent positives are indicated in the HPI above.  All other systems are negative.  Review of Systems  Vital Signs: BP 139/79    Pulse 60    Temp 98 F (36.7 C) (Oral)    Resp 20    Ht 5\' 2"  (1.575 m)    Wt 61.2 kg    SpO2 100%    BMI 24.69 kg/m   Physical Exam  VSS.HENT neg.Chest-clear.Abd benign.Alert and Ox3.  Imaging: Dg Chest Portable 1 View  Result Date: 06/18/2019 CLINICAL DATA:  Weakness EXAM: PORTABLE CHEST 1 VIEW COMPARISON:  12/31/2017 FINDINGS: Left pacer and right Port-A-Cath remain in place, unchanged. Cardiomegaly. Left lower lobe airspace opacity could reflect atelectasis or infiltrate. Right lung clear. No acute bony abnormality. IMPRESSION: Cardiomegaly. Left lower lobe atelectasis or infiltrate. Electronically Signed   By: Rolm Baptise M.D.   On: 06/18/2019 22:53   Mm 3d Screen Breast Bilateral  Result Date: 06/25/2019 CLINICAL DATA:  Screening. EXAM: DIGITAL SCREENING BILATERAL MAMMOGRAM WITH TOMO AND CAD COMPARISON:  Previous exam(s). ACR Breast Density Category c: The breast tissue is heterogeneously dense, which may obscure small masses. FINDINGS: There are no findings suspicious for malignancy. Images were processed with CAD. IMPRESSION: No mammographic evidence of malignancy. A result letter of this screening mammogram will be mailed directly  to the patient. RECOMMENDATION: Screening mammogram in one year. (Code:SM-B-01Y) BI-RADS CATEGORY  1: Negative.  Electronically Signed   By: Lovey Newcomer M.D.   On: 06/25/2019 11:18    Labs:  CBC: Recent Labs    05/04/19 1408 06/18/19 1034 06/18/19 1816 06/25/19 0943  WBC 3.0* 5.1 R 5.9 4.1  HGB 9.4* 8.0 aL* 8.0* 8.6*  HCT 29.1* 24.6 aL* 25.6* 28.2*  PLT 61.0* 76.0* 88* 86*    COAGS: Recent Labs    07/05/19 0747  INR 1.2    BMP: Recent Labs    08/21/18 1102 01/12/19 1101 05/04/19 1408 06/18/19 2304  NA 140  --  139 140  K 3.9  --  3.7 3.6  CL 105  --  103 105  CO2 27  --  27 24  GLUCOSE 87  --  80 113*  BUN 13  --  8 10  CALCIUM 9.7  --  9.5 9.6  CREATININE 0.81 0.88 0.63 0.66  GFRNONAA  --  >60  --  >60  GFRAA  --  >60  --  >60    LIVER FUNCTION TESTS: Recent Labs    08/21/18 1102 05/04/19 1408 06/18/19 2304  BILITOT 0.4 0.5 0.9  AST 35 28 28  ALT 18 16 18   ALKPHOS 55 77 68  PROT 7.7 7.9 8.5*  ALBUMIN 4.0 4.0 4.2    TUMOR MARKERS: No results for input(s): AFPTM, CEA, CA199, CHROMGRNA in the last 8760 hours.  Assessment and Plan:  Anemia. Plan - Bone marrow bx. Thank you for this interesting consult.  I greatly enjoyed meeting ANIKA SHORE and look forward to participating in their care.  A copy of this report was sent to the requesting provider on this date.  Electronically Signed: Register, Melanie Crazier, MD 07/05/2019, 8:28 AM   I spent a total of 15 min in face to face in clinical consultation, greater than 50% of which was counseling/coordinating care for this pt.

## 2019-07-05 NOTE — Progress Notes (Signed)
Pt stable after bone marrow bx.VSS.Back stable.D/C instructions given.F/U with pt's MD.

## 2019-07-05 NOTE — Discharge Instructions (Signed)
Moderate Conscious Sedation, Adult, Care After These instructions provide you with information about caring for yourself after your procedure. Your health care provider may also give you more specific instructions. Your treatment has been planned according to current medical practices, but problems sometimes occur. Call your health care provider if you have any problems or questions after your procedure. What can I expect after the procedure? After your procedure, it is common:  To feel sleepy for several hours.  To feel clumsy and have poor balance for several hours.  To have poor judgment for several hours.  To vomit if you eat too soon. Follow these instructions at home: For at least 24 hours after the procedure:   Do not: ? Participate in activities where you could fall or become injured. ? Drive. ? Use heavy machinery. ? Drink alcohol. ? Take sleeping pills or medicines that cause drowsiness. ? Make important decisions or sign legal documents. ? Take care of children on your own.  Rest. Eating and drinking  Follow the diet recommended by your health care provider.  If you vomit: ? Drink water, juice, or soup when you can drink without vomiting. ? Make sure you have little or no nausea before eating solid foods. General instructions  Have a responsible adult stay with you until you are awake and alert.  Take over-the-counter and prescription medicines only as told by your health care provider.  If you smoke, do not smoke without supervision.  Keep all follow-up visits as told by your health care provider. This is important. Contact a health care provider if:  You keep feeling nauseous or you keep vomiting.  You feel light-headed.  You develop a rash.  You have a fever. Get help right away if:  You have trouble breathing. This information is not intended to replace advice given to you by your health care provider. Make sure you discuss any questions you have  with your health care provider. Document Released: 05/12/2013 Document Revised: 07/04/2017 Document Reviewed: 11/11/2015 Elsevier Patient Education  2020 [employer].   Bone Marrow Aspiration and Bone Marrow Biopsy, Adult, Care After This sheet gives you information about how to care for yourself after your procedure. Your health care provider may also give you more specific instructions. If you have problems or questions, contact your health care provider. What can I expect after the procedure? After the procedure, it is common to have:  Mild pain and tenderness.  Swelling.  Bruising. Follow these instructions at home: Puncture site care      Follow instructions from your health care provider about how to take care of the puncture site. Make sure you: ? Wash your hands with soap and water before you change your bandage (dressing). If soap and water are not available, use hand sanitizer. ? Change your dressing as told by your health care provider.  Check your puncture siteevery day for signs of infection. Check for: ? More redness, swelling, or pain. ? More fluid or blood. ? Warmth. ? Pus or a bad smell. General instructions  Take over-the-counter and prescription medicines only as told by your health care provider.  Do not take baths, swim, or use a hot tub until your health care provider approves. Ask if you can take a shower or have a sponge bath.  Return to your normal activities as told by your health care provider. Ask your health care provider what activities are safe for you.  Do not drive for 24 hours if you were  given a medicine to help you relax (sedative) during your procedure. °· Keep all follow-up visits as told by your health care provider. This is important. °Contact a health care provider if: °· Your pain is not controlled with medicine. °Get help right away if: °· You have a fever. °· You have more redness, swelling, or pain around the puncture site. °· You  have more fluid or blood coming from the puncture site. °· Your puncture site feels warm to the touch. °· You have pus or a bad smell coming from the puncture site. °These symptoms may represent a serious problem that is an emergency. Do not wait to see if the symptoms will go away. Get medical help right away. Call your local emergency services (911 in the [place] Do not drive yourself to the hospital. °Summary °· After the procedure, it is common to have mild pain, tenderness, swelling, and bruising. °· Follow instructions from your health care provider about how to take care of the puncture site. °· Get help right away if you have any symptoms of infection or if you have more blood or fluid coming from the puncture site. °This information is not intended to replace advice given to you by your health care provider. Make sure you discuss any questions you have with your health care provider. °Document Released: 02/08/2005 Document Revised: 11/04/2017 Document Reviewed: 01/03/2016 °Elsevier Patient Education © 2020 Elsevier Inc. ° ° °

## 2019-07-05 NOTE — Progress Notes (Signed)
Patient clinically stable post BMB per DR Register, tolerated well, vitals stable throughout. Awake/alert and oriented,post procedure. 2x2 gauze dressing dry and intact, report given to [employer] with questions answered.

## 2019-07-06 ENCOUNTER — Other Ambulatory Visit: Payer: Self-pay

## 2019-07-06 ENCOUNTER — Inpatient Hospital Stay: Payer: Medicare Other

## 2019-07-07 ENCOUNTER — Other Ambulatory Visit: Payer: Self-pay

## 2019-07-07 LAB — SURGICAL PATHOLOGY

## 2019-07-08 ENCOUNTER — Inpatient Hospital Stay: Payer: Medicare Other | Attending: Oncology

## 2019-07-08 ENCOUNTER — Encounter: Payer: Self-pay | Admitting: Oncology

## 2019-07-08 ENCOUNTER — Ambulatory Visit (INDEPENDENT_AMBULATORY_CARE_PROVIDER_SITE_OTHER): Payer: Medicare Other

## 2019-07-08 ENCOUNTER — Other Ambulatory Visit: Payer: Self-pay

## 2019-07-08 ENCOUNTER — Telehealth: Payer: Self-pay | Admitting: Oncology

## 2019-07-08 ENCOUNTER — Inpatient Hospital Stay (HOSPITAL_BASED_OUTPATIENT_CLINIC_OR_DEPARTMENT_OTHER): Payer: Medicare Other | Admitting: Oncology

## 2019-07-08 VITALS — Temp 97.8°F

## 2019-07-08 VITALS — BP 125/53 | HR 78 | Temp 98.5°F | Resp 18 | Wt 138.5 lb

## 2019-07-08 DIAGNOSIS — M069 Rheumatoid arthritis, unspecified: Secondary | ICD-10-CM | POA: Insufficient documentation

## 2019-07-08 DIAGNOSIS — Z950 Presence of cardiac pacemaker: Secondary | ICD-10-CM | POA: Insufficient documentation

## 2019-07-08 DIAGNOSIS — C7A8 Other malignant neuroendocrine tumors: Secondary | ICD-10-CM | POA: Diagnosis not present

## 2019-07-08 DIAGNOSIS — C641 Malignant neoplasm of right kidney, except renal pelvis: Secondary | ICD-10-CM | POA: Diagnosis not present

## 2019-07-08 DIAGNOSIS — E785 Hyperlipidemia, unspecified: Secondary | ICD-10-CM | POA: Diagnosis not present

## 2019-07-08 DIAGNOSIS — R531 Weakness: Secondary | ICD-10-CM | POA: Diagnosis not present

## 2019-07-08 DIAGNOSIS — R296 Repeated falls: Secondary | ICD-10-CM

## 2019-07-08 DIAGNOSIS — E538 Deficiency of other specified B group vitamins: Secondary | ICD-10-CM

## 2019-07-08 DIAGNOSIS — D61818 Other pancytopenia: Secondary | ICD-10-CM | POA: Insufficient documentation

## 2019-07-08 DIAGNOSIS — D469 Myelodysplastic syndrome, unspecified: Secondary | ICD-10-CM | POA: Diagnosis present

## 2019-07-08 DIAGNOSIS — I10 Essential (primary) hypertension: Secondary | ICD-10-CM | POA: Diagnosis not present

## 2019-07-08 DIAGNOSIS — F419 Anxiety disorder, unspecified: Secondary | ICD-10-CM | POA: Diagnosis not present

## 2019-07-08 DIAGNOSIS — D649 Anemia, unspecified: Secondary | ICD-10-CM | POA: Diagnosis not present

## 2019-07-08 DIAGNOSIS — Z923 Personal history of irradiation: Secondary | ICD-10-CM | POA: Diagnosis not present

## 2019-07-08 DIAGNOSIS — E039 Hypothyroidism, unspecified: Secondary | ICD-10-CM | POA: Insufficient documentation

## 2019-07-08 DIAGNOSIS — Z87891 Personal history of nicotine dependence: Secondary | ICD-10-CM | POA: Insufficient documentation

## 2019-07-08 DIAGNOSIS — I70219 Atherosclerosis of native arteries of extremities with intermittent claudication, unspecified extremity: Secondary | ICD-10-CM

## 2019-07-08 DIAGNOSIS — Z79899 Other long term (current) drug therapy: Secondary | ICD-10-CM | POA: Insufficient documentation

## 2019-07-08 DIAGNOSIS — I480 Paroxysmal atrial fibrillation: Secondary | ICD-10-CM | POA: Diagnosis not present

## 2019-07-08 LAB — CBC WITH DIFFERENTIAL/PLATELET
Abs Immature Granulocytes: 1.2 10*3/uL — ABNORMAL HIGH (ref 0.00–0.07)
Band Neutrophils: 9 %
Basophils Absolute: 0 10*3/uL (ref 0.0–0.1)
Basophils Relative: 0 %
Eosinophils Absolute: 0 10*3/uL (ref 0.0–0.5)
Eosinophils Relative: 0 %
HCT: 18.8 % — ABNORMAL LOW (ref 36.0–46.0)
Hemoglobin: 5.6 g/dL — ABNORMAL LOW (ref 12.0–15.0)
Lymphocytes Relative: 22 %
Lymphs Abs: 1.2 10*3/uL (ref 0.7–4.0)
MCH: 28 pg (ref 26.0–34.0)
MCHC: 29.8 g/dL — ABNORMAL LOW (ref 30.0–36.0)
MCV: 94 fL (ref 80.0–100.0)
Metamyelocytes Relative: 6 %
Monocytes Absolute: 0.6 10*3/uL (ref 0.1–1.0)
Monocytes Relative: 11 %
Myelocytes: 14 %
Neutro Abs: 2.5 10*3/uL (ref 1.7–7.7)
Neutrophils Relative %: 36 %
Platelets: 71 10*3/uL — ABNORMAL LOW (ref 150–400)
Promyelocytes Relative: 2 %
RBC: 2 MIL/uL — ABNORMAL LOW (ref 3.87–5.11)
RDW: 20.5 % — ABNORMAL HIGH (ref 11.5–15.5)
Smear Review: DECREASED
WBC: 5.5 10*3/uL (ref 4.0–10.5)
nRBC: 2.4 % — ABNORMAL HIGH (ref 0.0–0.2)

## 2019-07-08 LAB — COMPREHENSIVE METABOLIC PANEL
ALT: 14 U/L (ref 0–44)
AST: 21 U/L (ref 15–41)
Albumin: 4 g/dL (ref 3.5–5.0)
Alkaline Phosphatase: 50 U/L (ref 38–126)
Anion gap: 11 (ref 5–15)
BUN: 32 mg/dL — ABNORMAL HIGH (ref 8–23)
CO2: 22 mmol/L (ref 22–32)
Calcium: 9.4 mg/dL (ref 8.9–10.3)
Chloride: 102 mmol/L (ref 98–111)
Creatinine, Ser: 0.9 mg/dL (ref 0.44–1.00)
GFR calc Af Amer: >60 mL/min (ref >60)
GFR calc non Af Amer: >60 mL/min (ref >60)
Glucose, Bld: 121 mg/dL — ABNORMAL HIGH (ref 70–99)
Potassium: 4 mmol/L (ref 3.5–5.1)
Sodium: 135 mmol/L (ref 135–145)
Total Bilirubin: 0.9 mg/dL (ref 0.3–1.2)
Total Protein: 7.9 g/dL (ref 6.5–8.1)

## 2019-07-08 LAB — SAMPLE TO BLOOD BANK

## 2019-07-08 LAB — PREPARE RBC (CROSSMATCH)

## 2019-07-08 MED ORDER — CYANOCOBALAMIN 1000 MCG/ML IJ SOLN
1000.0000 ug | Freq: Once | INTRAMUSCULAR | Status: AC
  Administered 2019-07-08: 1000 ug via INTRAMUSCULAR

## 2019-07-08 NOTE — Addendum Note (Signed)
Addended by: Betti Cruz on: 07/08/2019 12:05 PM   Modules accepted: Orders

## 2019-07-08 NOTE — Telephone Encounter (Signed)
Pt called stated since she had her Biopsy on Monday her legs keep giving out and it very ache, has also almost fallen twice. She is scheduled for lab\[facility]\blood on 07/13/19, but would like to know what can be done right away.

## 2019-07-08 NOTE — Progress Notes (Signed)
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  Patient completed cycle 3 of carboplatinum and etoposide on September 19, 2017.  She also completed XRT.  Given her persistent pancytopenia, treatment was discontinued.  Restaging PET scan from January 12, 2018 reviewed independently and reported as above with no findings to suggest recurrent or metastatic disease     Malignant neoplasm of lung, unspecified laterality, unspecified part of lung (HCC)  07/16/2017 Initial Diagnosis   Neuroendocrine carcinoma of lung (HCC)    Interval history- She is status post 3 cycles of carbo/etoposide last given in February 2019 for neuroendocrine carcinoma of lung that was discontinued early due to persistent pancytopenia. Pancytopenia thought to be due to Plaquenil or RA medications given previously negative bone marrow.  She is currently radiographically without evidence of metastatic disease in the lung, abdomen or pelvis.  She also has history of renal cell carcinoma of the right kidney status post partial nephrectomy in March 2017. NED-discharged from urology.  She developed dizziness  in November 2020 and was evaluated in the emergency room and found to have a hemoglobin of 8. (Baseline 9).  Reluctantly, the  emergency room transfused 1 unit of packed red blood cells.  Hemoglobin continued to trend down and was previously 7.2 on 07/05/2019.  Dr. Orlie Dakin, recommended repeat bone marrow. Had bone marrow biopsy on 07/05/2019 which favored myeloid neoplasm particularly myelodysplastic syndrome and negative for metastatic malignancy.  It recommended cytogenetic and FISH studies; currently pending.  She was scheduled for bone marrow results and further follow-up with Dr. Orlie Dakin next week.  In the interim, she states she developed pain to her right hip almost immediately after bone marrow biopsy and began feeling extremely fatigued and weak.  She describes pain unrelieved by Tylenol 650 mg every 12 hours and the inability to move around her house.  Her appetite has been okay and she continues to drink fluids.  She denies any bleeding, hematuria, melena, hematochezia, nosebleeds or vaginal bleeding.  ECOG FS:1 - Symptomatic but completely ambulatory  Review of systems- Review of Systems  Constitutional: Positive for malaise/fatigue. Negative for chills, fever and weight loss.  HENT: Negative for congestion, ear pain and tinnitus.   Eyes: Negative.  Negative for blurred vision and double vision.  Respiratory: Negative.  Negative for cough, sputum production and shortness of breath.   Cardiovascular: Negative.  Negative for chest pain, palpitations and leg swelling.  Gastrointestinal: Negative.  Negative for abdominal pain, constipation, diarrhea, nausea and vomiting.  Genitourinary: Negative for dysuria, frequency and urgency.  Musculoskeletal: Positive for joint pain (Right hip extending down her leg). Negative for back pain and falls.  Skin: Negative.  Negative for rash.  Neurological: Positive for dizziness and weakness. Negative for headaches.  Endo/Heme/Allergies: Negative.  Does not bruise/bleed easily.  Psychiatric/Behavioral: Negative.  Negative for depression. The patient is not nervous/anxious and  does not  have insomnia.      Current treatment- s/p 3 cycles of carbo etoposide.  Last given in February 2019.  Allergies  Allergen Reactions  . Metrizamide     Other reaction(s): Other (See Comments)  . Adhesive [Tape] Other (See Comments)    "irritate skin"  . Augmentin [Amoxicillin-Pot Clavulanate] Diarrhea    Severe diarrhea  . Codeine Nausea Only  . Morphine And Related Other (See Comments)    Shut down her organs  . Other     Skin irritation  . Tetanus Toxoid Other (See Comments)    REACTION: ARM SWELLING,REDNESS  . Tramadol Nausea And Vomiting     Past Medical History:  Diagnosis Date  . Anemia 02/2013   F/U with Dr. Orlie Dakin for Feraheme injections  . Anxiety   . Arthritis    Possible RA - Follow up appt with Dr. Gavin Potters  . Cellulitis of arm, right   . Chest pain 03/26/2013  . Diabetes insipidus (HCC)    On desmopressin  . GERD (gastroesophageal reflux disease)   . H/O seasonal allergies   . H/O transfusion of packed red blood cells 02/2013   3 units PRBC for severe anemia 02/2013  . Herniated disc   . History of kidney stones   . Hyperlipidemia   . Hypertension   . Hyperthyroidism    s/p radiation therapy, now hypothyroidism  . IBS (irritable bowel syndrome)   . Lung cancer (HCC) 06/2017   left  . Migraine   . Pacemaker    a. s/p successful implantation of a Medtronic CapSureFix Novus MRIT SureScan serial # [id number] on 06/28/16 by Dr. Graciela Husbands for sinus node dysfunction.  . Pancreatic cyst   . Paroxysmal A-fib (HCC)   . Renal cell cancer, right (HCC) 10/23/2015   Right partial nephrectomy.     Past Surgical History:  Procedure Laterality Date  . ABDOMINAL HYSTERECTOMY    . BREAST EXCISIONAL BIOPSY Right 2005   neg  . BREAST SURGERY  1990s   Biopsy  . CERVICAL SPINE SURGERY     Bone fusion  . COLON[place]  2008?   [place]  . COLONOSCOPY WITH PROPOFOL N/A 10/15/2016   Procedure: COLONOSCOPY WITH PROPOFOL;  Surgeon: Earline Mayotte, MD;   Location: [facility] ENDOSCOPY;  Service: Endoscopy;  Laterality: N/A;  . EP IMPLANTABLE DEVICE N/A 06/28/2016   Procedure: Pacemaker Implant;  Surgeon: Duke Salvia, MD;  Location: [facility] INVASIVE CV LAB;  Service: Cardiovascular;  Laterality: N/A;  . HAMMER TOE SURGERY    . HERNIA REPAIR Left    Inguinal Hernia Repair  . PORTA CATH INSERTION N/A 07/24/2017   Procedure: PORTA CATH INSERTION;  Surgeon: Annice Needy, MD;  Location: [facility] INVASIVE CV LAB;  Service: Cardiovascular;  Laterality: N/A;  . ROBOTIC ASSITED PARTIAL NEPHRECTOMY Right 10/23/2015   Procedure: ROBOTIC ASSITED PARTIAL NEPHRECTOMY with intraop ultrasound;  Surgeon: Vanna Scotland, MD;  Location: [facility] ORS;  Service: Urology;  Laterality: Right;  . TUBAL LIGATION      Social History   Socioeconomic History  . Marital status: Widowed    Spouse name: Not on file  . Number of children: 4  . Years of education: 77  . Highest education level: Not on file  Occupational History  . Occupation: Designer, fashion/clothing    Comment: Retired  . Occupation: Camera operator and [employer]    Comment: Retired  Tobacco Use  . Smoking status: Former Smoker    Packs/day: 0.50    Years: 30.00  Pack years: 15.00    Types: Cigarettes    Quit date: 12/03/2012    Years since quitting: 6.6  . Smokeless tobacco: Never Used  Substance and Sexual Activity  . Alcohol use: No    Alcohol/week: 0.0 standard drinks  . Drug use: No  . Sexual activity: Never  Other Topics Concern  . Not on file  Social History Narrative   Ms. Cockburn was born and reared in [place]. She is a widow since 46. She was married for 48 years. They had 4 children (daughters). She is currently leaving at [facility] since June. 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 She enjoys shopping. She also enjoys music, word puzzles and she loves spending time with her family. She loves attending church. One  of her favorite things is wearing hats. She absolutely loves hats!   Social Determinants of Health   Financial Resource Strain:   . Difficulty of Paying Living Expenses: Not on file  Food Insecurity:   . Worried About Programme researcher, broadcasting/film/video in the Last Year: Not on file  . Ran Out of Food in the Last Year: Not on file  Transportation Needs:   . Lack of Transportation (Medical): Not on file  . Lack of Transportation (Non-Medical): Not on file  Physical Activity:   . Days of Exercise per Week: Not on file  . Minutes of Exercise per Session: Not on file  Stress:   . Feeling of Stress : Not on file  Social Connections:   . Frequency of Communication with Friends and Family: Not on file  . Frequency of Social Gatherings with Friends and Family: Not on file  . Attends Religious Services: Not on file  . Active Member of Clubs or Organizations: Not on file  . Attends Banker Meetings: Not on file  . Marital Status: Not on file  Intimate Partner Violence:   . Fear of Current or Ex-Partner: Not on file  . Emotionally Abused: Not on file  . Physically Abused: Not on file  . Sexually Abused: Not on file    Family History  Problem Relation Age of Onset  . Heart disease Mother   . Heart disease Father   . Diabetes Brother   . Kidney disease Brother   . Stroke Daughter        due to medication reaction  . Breast cancer Cousin   . Prostate cancer Neg Hx   . Hematuria Neg Hx      Current Outpatient Medications:  .  acetaminophen (TYLENOL) 325 MG tablet, Take 2 tablets (650 mg total) by mouth every 6 (six) hours as needed for pain. (Patient taking differently: Take 650 mg by mouth every 6 (six) hours as needed for pain. ), Disp: , Rfl:  .  albuterol (PROVENTIL HFA;VENTOLIN HFA) 108 (90 Base) MCG/ACT inhaler, Inhale 2 puffs into the lungs every 6 (six) hours as needed for wheezing or shortness of breath., Disp: 1 Inhaler, Rfl: 2 .  brimonidine (ALPHAGAN) 0.2 % ophthalmic  solution, Place 1 drop into both eyes 2 (two) times daily. , Disp: , Rfl:  .  Calcium Carbonate-Vitamin D (CALCIUM-CARB 600 + D) 600-125 MG-UNIT TABS, Take 2 tablets by mouth daily., Disp: , Rfl:  .  carvedilol (COREG) 6.25 MG tablet, 1.5 tablets twice daily with meals (Patient taking differently: Take 9.375 mg by mouth 2 (two) times daily with a meal. ),  Disp: 270 tablet, Rfl: 0 .  cholecalciferol (VITAMIN D) 1000 units tablet, Take 1,000 Units by mouth daily., Disp: , Rfl:  .  dorzolamide (TRUSOPT) 2 % ophthalmic solution, Place 1 drop into both eyes 2 (two) times daily. , Disp: , Rfl:  .  ferrous sulfate 325 (65 FE) MG tablet, Take 325 mg by mouth daily with breakfast., Disp: , Rfl:  .  furosemide (LASIX) 20 MG tablet, TAKE ONE-HALF TABLET BY MOUTH DAILY (Patient taking differently: Take 10 mg by mouth daily. ), Disp: 45 tablet, Rfl: 3 .  hydroxychloroquine (PLAQUENIL) 200 MG tablet, Take 400 mg by mouth daily. , Disp: , Rfl:  .  latanoprost (XALATAN) 0.005 % ophthalmic solution, Place 1 drop into both eyes at bedtime. , Disp: , Rfl:  .  levothyroxine (SYNTHROID) 112 MCG tablet, Take 1 tablet (112 mcg total) by mouth every morning. (Patient not taking: Reported on 08/23/2019), Disp: 90 tablet, Rfl: 0 .  losartan (COZAAR) 100 MG tablet, Take 1 tablet (100 mg total) by mouth at bedtime., Disp: 90 tablet, Rfl: 0 .  mirtazapine (REMERON) 15 MG tablet, Take 1 tablet (15 mg total) by mouth at bedtime., Disp: 30 tablet, Rfl: 2 .  MYRBETRIQ 50 MG TB24 tablet, TAKE ONE TABLET BY MOUTH DAILY (Patient not taking: No sig reported), Disp: 30 tablet, Rfl: 4 .  nitroGLYCERIN (NITROLINGUAL) 0.4 MG/SPRAY spray, Place 1 spray under the tongue every 5 (five) minutes x 3 doses as needed for chest pain., Disp: 4.9 g, Rfl: 3 .  Omega-3 Fatty Acids (FISH OIL) 1000 MG CAPS, Take 1,000 mg by mouth daily. , Disp: , Rfl:  .  PARoxetine (PAXIL) 10 MG tablet, TAKE ONE TABLET BY MOUTH DAILY AFTER DINNER (Patient taking  differently: Take 10 mg by mouth daily after supper. ), Disp: 90 tablet, Rfl: 1 .  vitamin C (ASCORBIC ACID) 500 MG tablet, Take 500 mg by mouth daily., Disp: , Rfl:  .  ALPRAZolam (XANAX) 0.25 MG tablet, TAKE ONE TABLET BY MOUTH TWICE A DAY AS NEEDED FOR ANXIETY (Patient taking differently: Take 0.25 mg by mouth 2 (two) times daily as needed for anxiety. TAKE ONE TABLET BY MOUTH TWICE A DAY AS NEEDED FOR ANXIETY), Disp: 60 tablet, Rfl: 2 .  gabapentin (NEURONTIN) 300 MG capsule, TAKE ONE CAPSULE BY MOUTH THREE TIMES A DAY AS NEEDED FOR SHINGLES (Patient taking differently: Take 300 mg by mouth 3 (three) times daily. TAKE ONE CAPSULE BY MOUTH THREE TIMES A DAY AS NEEDED FOR SHINGLES), Disp: 90 capsule, Rfl: 0 .  HYDROcodone-acetaminophen (NORCO) 10-325 MG tablet, Take 1 tablet by mouth every 6 (six) hours as needed., Disp: 30 tablet, Rfl: 0 .  levothyroxine (SYNTHROID) 100 MCG tablet, Take 100 mcg by mouth daily before breakfast., Disp: , Rfl:  .  loperamide (IMODIUM A-D) 2 MG tablet, Take 1 tablet (2 mg total) by mouth 4 (four) times daily as needed for diarrhea or loose stools., Disp: 30 tablet, Rfl: 0 .  ondansetron (ZOFRAN ODT) 4 MG disintegrating tablet, Take 1 tablet (4 mg total) by mouth every 8 (eight) hours as needed for nausea or vomiting., Disp: 20 tablet, Rfl: 0 .  oxybutynin (DITROPAN-XL) 10 MG 24 hr tablet, TAKE ONE TABLET BY MOUTH AT BEDTIME (Patient taking differently: Take 10 mg by mouth at bedtime. ), Disp: 30 tablet, Rfl: 0 .  pantoprazole (PROTONIX) 40 MG tablet, Take 1 tablet (40 mg total) by mouth 2 (two) times daily., Disp: 60 tablet, Rfl: 3 .  predniSONE (  DELTASONE) 10 MG tablet, 6 tablets daily for 3 days,  then reduce by 1 tablet daily until gone (Patient not taking: Reported on 08/03/2019), Disp: 33 tablet, Rfl: 0 .  saccharomyces boulardii (FLORASTOR) 250 MG capsule, Take 250 mg by mouth 2 (two) times daily., Disp: , Rfl:  .  traZODone (DESYREL) 50 MG tablet, Take 50 mg by  mouth at bedtime as needed for sleep., Disp: , Rfl:   Physical exam:  Vitals:   07/08/19 1417  BP: (!) 125/53  Pulse: 78  Resp: 18  Temp: 98.5 F (36.9 C)  TempSrc: Tympanic  SpO2: 100%  Weight: 138 lb 8 oz (62.8 kg)   Physical Exam Constitutional:      Appearance: Normal appearance.  HENT:     Head: Normocephalic and atraumatic.  Eyes:     Pupils: Pupils are equal, round, and reactive to light.  Cardiovascular:     Rate and Rhythm: Normal rate and regular rhythm.     Heart sounds: Normal heart sounds. No murmur.  Pulmonary:     Effort: Pulmonary effort is normal.     Breath sounds: Normal breath sounds. No wheezing.  Abdominal:     General: Bowel sounds are normal. There is no distension.     Palpations: Abdomen is soft.     Tenderness: There is no abdominal tenderness.  Musculoskeletal:        General: Normal range of motion.     Cervical back: Normal range of motion.     Right hip: Tenderness and bony tenderness present.     Right lower leg: Tenderness and bony tenderness present.  Skin:    General: Skin is warm and dry.     Findings: No rash.  Neurological:     Mental Status: She is alert and oriented to person, place, and time.     Motor: Weakness present.  Psychiatric:        Judgment: Judgment normal.      CMP Latest Ref Rng & Units 08/04/2019  Glucose 70 - 99 mg/dL 010(U)  BUN 8 - 23 mg/dL 72(Z)  Creatinine 3.66 - 1.00 mg/dL 4.40(H)  Sodium 474 - 259 mmol/L 142  Potassium 3.5 - 5.1 mmol/L 4.5  Chloride 98 - 111 mmol/L 107  CO2 22 - 32 mmol/L 20(L)  Calcium 8.9 - 10.3 mg/dL 5.6(L)  Total Protein 6.5 - 8.1 g/dL 6.7  Total Bilirubin 0.3 - 1.2 mg/dL 0.9  Alkaline Phos 38 - 126 U/L 87  AST 15 - 41 U/L 19  ALT 0 - 44 U/L 11   CBC Latest Ref Rng & Units 08/04/2019  WBC 4.0 - 10.5 K/uL 12.6(H)  Hemoglobin 12.0 - 15.0 g/dL 8.0(L)  Hematocrit 36.0 - 46.0 % 27.0(L)  Platelets 150 - 400 K/uL 67(L)    No images are attached to the encounter.  DG Chest  1 View  Result Date: 08/04/2019 CLINICAL DATA:  Hypoxia EXAM: CHEST  1 VIEW COMPARISON:  08/03/2019 FINDINGS: There is a right-sided Port-A-Cath with the tip projecting over the SVC. There is no focal consolidation. There is no pleural effusion or pneumothorax. There is stable cardiomegaly. There is a dual lead cardiac pacemaker. There is no acute osseous abnormality. IMPRESSION: No active disease. Electronically Signed   By: Elige Ko   On: 08/04/2019 19:51   CT Chest W Contrast  Result Date: 07/22/2019 CLINICAL DATA:  History of lung cancer.  Restaging. EXAM: CT CHEST, ABDOMEN, AND PELVIS WITH CONTRAST TECHNIQUE: Multidetector CT imaging of the  chest, abdomen and pelvis was performed following the standard protocol during bolus administration of intravenous contrast. CONTRAST:  OMNIPAQUE IOHEXOL 300 MG/ML  SOLN COMPARISON:  01/13/2019 FINDINGS: CT CHEST FINDINGS Cardiovascular: Cardiac enlargement. The small to moderate posterior pericardial effusion identified, image 111/6. Aortic atherosclerosis. Lad and RCA coronary artery calcifications. Mediastinum/Nodes: Thyroid gland not visualized and may be surgically absent The trachea appears patent and is midline. Normal appearance of the esophagus. No mediastinal, hilar, supraclavicular or axillary adenopathy. Lungs/Pleura: Paraseptal and centrilobular emphysema. No pleural effusion. Bandlike area of fibrosis and masslike architectural distortion within the perihilar left lung is again identified compatible with changes due to external beam radiation. The appearance is unchanged compared with the previous exam. No suspicious pulmonary nodule or mass identified at this time. Musculoskeletal: Healing left lateral rib deformities. Multi level degenerative disc disease identified throughout the thoracic spine. CT ABDOMEN PELVIS FINDINGS Hepatobiliary: No focal liver abnormality. Gallbladder is unremarkable. No biliary ductal dilatation. Pancreas: Similar  appearance of mild increase caliber of the main duct. No inflammation. No mass Spleen: The spleen measures 9.7 x 6.3 by 15.6 cm (volume = 500 cm^3). New linear, bandlike area of hypoenhancement within the superior aspect of the spleen is identified, image 66/4 and is concerning for a area of splenic laceration. Adrenals/Urinary Tract: Normal appearance of the adrenal glands. Right kidney cyst measures 1.7 cm. No mass or hydronephrosis. The urinary bladder is unremarkable. Stomach/Bowel: Stomach is within normal limits. Appendix appears normal. No evidence of bowel wall thickening, distention, or inflammatory changes. Vascular/Lymphatic: Aortic atherosclerosis. No aneurysm. No abdominopelvic adenopathy identified. Reproductive: Status post hysterectomy. No adnexal masses. Other: There is a small to moderate volume of free fluid within the abdomen and pelvis. No discrete fluid collections. Musculoskeletal: Scoliosis and degenerative disc disease. No aggressive lytic or sclerotic bone lesions identified the at this time. IMPRESSION: 1. Stable changes of external beam radiation within the left lung without findings to suggest Lung cancer recurrence within the chest. 2. Significant increase in volume of the spleen. New bandlike area of low attenuation within the superior pole of spleen concerning for with age indeterminate splenic laceration. Alternatively, findings may reflect splenic infarct. 3. New small to moderate volume of free fluid within the abdomen and pelvis. Etiology is indeterminate but in the setting of splenic laceration could represent resolving hemoperitoneum. 4. Small pericardial effusion, new from previous study. 5. Healing left rib fractures. 6. Aortic Atherosclerosis (ICD10-I70.0) and Emphysema (ICD10-J43.9). Electronically Signed   By: Signa Kell M.D.   On: 07/22/2019 10:40   CT Angio Chest PE W and/or Wo Contrast  Result Date: 08/03/2019 CLINICAL DATA:  Shortness of breath EXAM: CT  ANGIOGRAPHY CHEST WITH CONTRAST TECHNIQUE: Multidetector CT imaging of the chest was performed using the standard protocol during bolus administration of intravenous contrast. Multiplanar CT image reconstructions and MIPs were obtained to evaluate the vascular anatomy. CONTRAST:  75mL OMNIPAQUE IOHEXOL 350 MG/ML SOLN COMPARISON:  07/22/2019 FINDINGS: Cardiovascular: Cardiomegaly with persistent small pericardial effusion. Pulmonary vasculature is adequately opacified. No filling defect to the segmental branch pulmonary arteries to suggest pulmonary embolism. Right-sided chest port and left-sided implanted cardiac device remain unchanged in positioning. Atherosclerotic calcification of the aorta and coronary arteries. Mediastinum/Nodes: No axillary, mediastinal, or hilar lymphadenopathy. Trachea and esophagus within normal limits. Lungs/Pleura: Post radiation changes with fibrosis and architectural distortion is again identified in the left perihilar region. Moderate emphysema. No focal airspace consolidation, pleural effusion, or pneumothorax. Upper Abdomen: Low-density area within the posterior aspect of the  superior spleen. There also appears to be small amount of fluid around the spleen. No perihepatic ascites is evident. Musculoskeletal: Similar degenerative changes throughout the thoracic spine. Review of the MIP images confirms the above findings. IMPRESSION: 1. Negative for pulmonary embolus. 2. Stable post radiation changes in the left perihilar region. 3. Cardiomegaly with persistent small pericardial effusion. 4. Low-density area within the posterior aspect of the superior spleen, may represent a splenic infarct. Small amount of fluid around the spleen, which appears new from prior. 5. Emphysema and aortic atherosclerosis. Aortic Atherosclerosis (ICD10-I70.0) and Emphysema (ICD10-J43.9). Electronically Signed   By: Duanne Guess D.O.   On: 08/03/2019 12:43   CT Abdomen Pelvis W Contrast  Result  Date: 07/22/2019 CLINICAL DATA:  History of lung cancer.  Restaging. EXAM: CT CHEST, ABDOMEN, AND PELVIS WITH CONTRAST TECHNIQUE: Multidetector CT imaging of the chest, abdomen and pelvis was performed following the standard protocol during bolus administration of intravenous contrast. CONTRAST:  OMNIPAQUE IOHEXOL 300 MG/ML  SOLN COMPARISON:  01/13/2019 FINDINGS: CT CHEST FINDINGS Cardiovascular: Cardiac enlargement. The small to moderate posterior pericardial effusion identified, image 111/6. Aortic atherosclerosis. Lad and RCA coronary artery calcifications. Mediastinum/Nodes: Thyroid gland not visualized and may be surgically absent The trachea appears patent and is midline. Normal appearance of the esophagus. No mediastinal, hilar, supraclavicular or axillary adenopathy. Lungs/Pleura: Paraseptal and centrilobular emphysema. No pleural effusion. Bandlike area of fibrosis and masslike architectural distortion within the perihilar left lung is again identified compatible with changes due to external beam radiation. The appearance is unchanged compared with the previous exam. No suspicious pulmonary nodule or mass identified at this time. Musculoskeletal: Healing left lateral rib deformities. Multi level degenerative disc disease identified throughout the thoracic spine. CT ABDOMEN PELVIS FINDINGS Hepatobiliary: No focal liver abnormality. Gallbladder is unremarkable. No biliary ductal dilatation. Pancreas: Similar appearance of mild increase caliber of the main duct. No inflammation. No mass Spleen: The spleen measures 9.7 x 6.3 by 15.6 cm (volume = 500 cm^3). New linear, bandlike area of hypoenhancement within the superior aspect of the spleen is identified, image 66/4 and is concerning for a area of splenic laceration. Adrenals/Urinary Tract: Normal appearance of the adrenal glands. Right kidney cyst measures 1.7 cm. No mass or hydronephrosis. The urinary bladder is unremarkable. Stomach/Bowel: Stomach is  within normal limits. Appendix appears normal. No evidence of bowel wall thickening, distention, or inflammatory changes. Vascular/Lymphatic: Aortic atherosclerosis. No aneurysm. No abdominopelvic adenopathy identified. Reproductive: Status post hysterectomy. No adnexal masses. Other: There is a small to moderate volume of free fluid within the abdomen and pelvis. No discrete fluid collections. Musculoskeletal: Scoliosis and degenerative disc disease. No aggressive lytic or sclerotic bone lesions identified the at this time. IMPRESSION: 1. Stable changes of external beam radiation within the left lung without findings to suggest Lung cancer recurrence within the chest. 2. Significant increase in volume of the spleen. New bandlike area of low attenuation within the superior pole of spleen concerning for with age indeterminate splenic laceration. Alternatively, findings may reflect splenic infarct. 3. New small to moderate volume of free fluid within the abdomen and pelvis. Etiology is indeterminate but in the setting of splenic laceration could represent resolving hemoperitoneum. 4. Small pericardial effusion, new from previous study. 5. Healing left rib fractures. 6. Aortic Atherosclerosis (ICD10-I70.0) and Emphysema (ICD10-J43.9). Electronically Signed   By: Signa Kell M.D.   On: 07/22/2019 10:40   DG Chest Port 1 View  Result Date: 08/03/2019 CLINICAL DATA:  Acute respiratory distress. EXAM: PORTABLE  CHEST 1 VIEW COMPARISON:  CTA chest and chest x-ray from same day. FINDINGS: Unchanged left chest wall pacemaker. Unchanged right chest wall port catheter. Stable cardiomegaly. Normal pulmonary vascularity. Unchanged subtle increased density in the left upper lobe. No pleural effusion or pneumothorax. No acute osseous abnormality. IMPRESSION: 1. Unchanged subtle increased density in the left upper lobe, corresponding to faint ground-glass density seen on chest CT from earlier today, likely infectious or  inflammatory. Electronically Signed   By: Obie Dredge M.D.   On: 08/03/2019 20:47   DG Chest Port 1 View  Result Date: 08/03/2019 CLINICAL DATA:  Dyspnea. EXAM: PORTABLE CHEST 1 VIEW COMPARISON:  July 30, 2019. FINDINGS: Stable cardiomegaly. No pneumothorax or significant pleural effusion is noted. Right internal jugular Port-A-Cath is unchanged in position. Left-sided pacemaker is unchanged. Lungs are clear. Old left rib fractures are noted. IMPRESSION: No active disease. Electronically Signed   By: Lupita Raider M.D.   On: 08/03/2019 10:07   DG Chest Portable 1 View  Result Date: 07/30/2019 CLINICAL DATA:  Weakness and diarrhea. History of lung carcinoma and renal carcinoma. EXAM: PORTABLE CHEST 1 VIEW COMPARISON:  CT of the chest on 07/22/2019 and prior chest x-ray on 06/18/2019 FINDINGS: Stable cardiac enlargement and appearance of dual-chamber pacemaker. Stable appearance of Port-A-Cath. Stable chronic atelectasis and volume loss at the left lung base. There is no evidence of pulmonary edema, consolidation, pneumothorax, nodule or pleural fluid. The visualized skeletal structures are unremarkable. IMPRESSION: Stable chest x-ray demonstrating stable cardiomegaly and chronic atelectasis at the left lung base. Electronically Signed   By: Irish Lack M.D.   On: 07/30/2019 13:40   Assessment and plan- Patient is a 78 y.o. female who presents to symptom management for right hip pain and generalized weakness.  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  She completed 3 cycles of carbo/etoposide in February 2019 along with radiation but treatment discontinued secondary to persistent pancytopenia for neuroendocrine carcinoma and status post partial nephrectomy for renal cell carcinoma of right kidney. Most recent imaging did not reveal any recurrent  or progressive disease.  She has scheduled follow-up with Dr. Orlie Dakin on 07/13/2019.  Generalized weakness: Likely related to progressive MDS.  Will check labs today.  See plan below.  Symptomatic anemia: Hemoglobin 5.6 on lab draw today.  Will require 2 units packed red blood cells ASAP.  Unfortunately, no room in our infusion space.  Will call short stay to see if they are able to accommodate.  Patient scheduled for 7 AM tomorrow morning.  Spoke with patient at length regarding need for blood ASAP.  Recommended she be seen in the emergency room for quicker treatment but she wish to remain outpatient.  First available infusion appointment made for 2 units packed red blood cells.  She is scheduled for tomorrow morning at 7 AM.  We also spoke about safety and I encouraged she have someone stay with her this evening should she fall.  We spoke about changing positions slowly.  Right hip pain: No obvious deformity.  No hematoma present.  Bony tenderness at site of bone marrow biopsy.  Recommend Tylenol and ice/heat as needed for comfort.  Plan: Stat labs. (Hemoglobin 5.6, platelets 71,000) Assessment. Vital signs.  Stable. Patient will need to 2 units packed red blood cells ASAP.  Disposition: RTC short stay tomorrow for 2 units packed red blood  cells.  Orders placed. Return to clinic as scheduled for additional lab work and to see Dr. Orlie Dakin on 07/13/2019   Visit Diagnosis 1. Anemia, unspecified type   2. Weakness   3. Large cell neuroendocrine carcinoma [facility])     Patient expressed understanding and was in agreement with this plan. She also understands that She can call clinic at any time with any questions, concerns, or complaints.   Greater than 50% was spent in counseling and coordination of care with this patient including but not limited to discussion of the relevant topics above (See A&P) including, but not limited to diagnosis and management of acute and chronic medical  conditions.   Thank you for allowing me to participate in the care of this very pleasant patient.    Mauro Kaufmann, NP [facility] Cell - [phone] Pager- [phone] 08/09/2019 12:12 PM

## 2019-07-08 NOTE — Progress Notes (Signed)
Pt rates pain in right hip at a 10, reports had bone marrow aspiration done Monday and has been in severe pain since and it radiates down right leg to feet.

## 2019-07-08 NOTE — Patient Instructions (Signed)
I am so sorry that you are not feeling well today.  We drew labs which revealed a hemoglobin of 5.6 which is very low.  We were able to get you scheduled for 2 units of blood beginning tomorrow morning at 7 AM at same day surgery which is located through the medical mall entrance.  You will register at the desk and take the elevator up to the second floor where they will do your infusion.  For pain of your right hip, we discussed taking 2 Tylenol every 8-12 hours to see if this helps.  Also you can apply a heating pad.  I think your bone is bruised from the bone marrow biopsy.  I did not see any cellulitis or any concerns.  I am so sorry that you if you are feeling bad.  Please be very cautious this evening.  With your hemoglobin being so low you are definitely at high risk for falling.  You may become dizzy.  Keep your appointment for Tuesday to see Dr. Grayland Ormond for the results of your bone marrow biopsy.  If anything changes between now and receiving your blood transfusion please report directly to the emergency room.  Please call me with any questions [phone].  Lab Results  Component Value Date   WBC 5.5 07/08/2019   HGB 5.6 (L) 07/08/2019   HCT 18.8 (L) 07/08/2019   MCV 94.0 07/08/2019   PLT 71 (L) 07/08/2019     Faythe Casa, NP 07/08/2019 3:08 PM

## 2019-07-08 NOTE — Telephone Encounter (Signed)
Please schedule for labs (cbc, cmp, hold tube) and evaluation in [facility].

## 2019-07-08 NOTE — Telephone Encounter (Signed)
What is she being seen for? I don't see a complaint.

## 2019-07-08 NOTE — Telephone Encounter (Signed)
Patient accepts appointment for 115 lab, [place]

## 2019-07-09 ENCOUNTER — Ambulatory Visit
Admission: RE | Admit: 2019-07-09 | Discharge: 2019-07-09 | Disposition: A | Discharge status: Home / Self Care | Payer: Medicare Other | Source: Ambulatory Visit | Attending: Oncology | Admitting: Oncology

## 2019-07-09 ENCOUNTER — Other Ambulatory Visit: Payer: Self-pay

## 2019-07-09 VITALS — BP 141/58 | HR 72 | Temp 97.0°F | Resp 24

## 2019-07-09 DIAGNOSIS — Z85528 Personal history of other malignant neoplasm of kidney: Secondary | ICD-10-CM

## 2019-07-09 DIAGNOSIS — M25551 Pain in right hip: Secondary | ICD-10-CM

## 2019-07-09 DIAGNOSIS — D649 Anemia, unspecified: Secondary | ICD-10-CM | POA: Diagnosis not present

## 2019-07-09 LAB — PREPARE RBC (CROSSMATCH)

## 2019-07-09 MED ORDER — GABAPENTIN 300 MG PO CAPS
ORAL_CAPSULE | ORAL | Status: AC
  Administered 2019-07-09: 300 mg via ORAL
  Filled 2019-07-09: qty 1

## 2019-07-09 MED ORDER — ACETAMINOPHEN 325 MG PO TABS
ORAL_TABLET | ORAL | Status: AC
  Administered 2019-07-09: 650 mg via ORAL
  Filled 2019-07-09: qty 2

## 2019-07-09 MED ORDER — SODIUM CHLORIDE 0.9% IV SOLUTION: Freq: Once | INTRAVENOUS | Status: DC

## 2019-07-09 MED ORDER — GABAPENTIN 300 MG PO CAPS
300.0000 mg | ORAL_CAPSULE | Freq: Once | ORAL | Status: AC
  Administered 2019-07-09: 13:00:00 300 mg via ORAL

## 2019-07-09 MED ORDER — HEPARIN SOD (PORK) LOCK FLUSH 10 UNIT/ML IV SOLN
10.0000 [IU] | Freq: Once | INTRAVENOUS | Status: DC
  Filled 2019-07-09: qty 1

## 2019-07-09 MED ORDER — DIPHENHYDRAMINE HCL 25 MG PO CAPS
ORAL_CAPSULE | ORAL | Status: AC
  Administered 2019-07-09: 25 mg via ORAL
  Filled 2019-07-09: qty 1

## 2019-07-09 MED ORDER — HEPARIN SOD (PORK) LOCK FLUSH 100 UNIT/ML IV SOLN
INTRAVENOUS | Status: AC
  Administered 2019-07-09: 500 [IU] via INTRAVENOUS
  Filled 2019-07-09: qty 5

## 2019-07-09 MED ORDER — DIPHENHYDRAMINE HCL 25 MG PO CAPS: 25.0000 mg | ORAL_CAPSULE | Freq: Once | ORAL | Status: DC

## 2019-07-09 MED ORDER — SODIUM CHLORIDE FLUSH 0.9 % IV SOLN
INTRAVENOUS | Status: AC
  Administered 2019-07-09: 10 mL
  Filled 2019-07-09: qty 10

## 2019-07-09 MED ORDER — ACETAMINOPHEN 325 MG PO TABS: 650.0000 mg | ORAL_TABLET | Freq: Once | ORAL | Status: DC

## 2019-07-09 MED ORDER — ACETAMINOPHEN 325 MG PO TABS
650.0000 mg | ORAL_TABLET | Freq: Once | ORAL | Status: AC
  Administered 2019-07-09: 07:00:00 650 mg via ORAL

## 2019-07-09 MED ORDER — ACETAMINOPHEN 325 MG PO TABS
650.0000 mg | ORAL_TABLET | Freq: Once | ORAL | Status: AC
  Administered 2019-07-09: 13:00:00 650 mg via ORAL

## 2019-07-09 MED ORDER — DIPHENHYDRAMINE HCL 25 MG PO CAPS
25.0000 mg | ORAL_CAPSULE | Freq: Once | ORAL | Status: AC
  Administered 2019-07-09: 07:00:00 25 mg via ORAL

## 2019-07-09 MED ORDER — HEPARIN SOD (PORK) LOCK FLUSH 100 UNIT/ML IV SOLN
500.0000 [IU] | Freq: Once | INTRAVENOUS | Status: AC
  Administered 2019-07-09: 14:00:00 500 [IU] via INTRAVENOUS

## 2019-07-09 NOTE — Discharge Instructions (Signed)
Blood Transfusion, Adult, Care After This sheet gives you information about how to care for yourself after your procedure. Your doctor may also give you more specific instructions. If you have problems or questions, contact your doctor. Follow these instructions at home:   Take over-the-counter and prescription medicines only as told by your doctor.  Go back to your normal activities as told by your doctor.  Follow instructions from your doctor about how to take care of the area where an IV tube was put into your vein (insertion site). Make sure you: ? Wash your hands with soap and water before you change your bandage (dressing). If there is no soap and water, use hand sanitizer. ? Change your bandage as told by your doctor.  Check your IV insertion site every day for signs of infection. Check for: ? More redness, swelling, or pain. ? More fluid or blood. ? Warmth. ? Pus or a bad smell. Contact a doctor if:  You have more redness, swelling, or pain around the IV insertion site.  You have more fluid or blood coming from the IV insertion site.  Your IV insertion site feels warm to the touch.  You have pus or a bad smell coming from the IV insertion site.  Your pee (urine) turns pink, red, or brown.  You feel weak after doing your normal activities. Get help right away if:  You have signs of a serious allergic or body defense (immune) system reaction, including: ? Itchiness. ? Hives. ? Trouble breathing. ? Anxiety. ? Pain in your chest or lower back. ? Fever, flushing, and chills. ? Fast pulse. ? Rash. ? Watery poop (diarrhea). ? Throwing up (vomiting). ? Dark pee. ? Serious headache. ? Dizziness. ? Stiff neck. ? Yellow color in your face or the white parts of your eyes (jaundice). Summary  After a blood transfusion, return to your normal activities as told by your doctor.  Every day, check for signs of infection where the IV tube was put into your vein.  Some  signs of infection are warm skin, more redness and pain, more fluid or blood, and pus or a bad smell where the needle went in.  Contact your doctor if you feel weak or have any unusual symptoms. This information is not intended to replace advice given to you by your health care provider. Make sure you discuss any questions you have with your health care provider. Document Released: 08/12/2014 Document Revised: 11/26/2017 Document Reviewed: 03/15/2016 Elsevier Patient Education  2020 [employer]  

## 2019-07-09 NOTE — Progress Notes (Signed)
Remote pacemaker transmission.   

## 2019-07-10 LAB — TYPE AND SCREEN
ABO/RH(D): O POS
Antibody Screen: NEGATIVE
Unit division: 0
Unit division: 0

## 2019-07-10 LAB — BPAM RBC
Blood Product Expiration Date: 202012302359
Blood Product Expiration Date: 202012302359
ISSUE DATE / TIME: 202012040751
ISSUE DATE / TIME: 202012041022
Unit Type and Rh: 5100
Unit Type and Rh: 5100

## 2019-07-11 DIAGNOSIS — D469 Myelodysplastic syndrome, unspecified: Secondary | ICD-10-CM | POA: Insufficient documentation

## 2019-07-11 NOTE — Progress Notes (Signed)
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  HISTORY OF PRESENT ILLNESS:  Patient returns to clinic today for repeat laboratory work, discussion of her bone marrow biopsy results, and additional blood transfusion.  She continues to have significant weakness and fatigue.  She also had increased pain at the site of her bone marrow biopsy, but this has improved.  She does not complain of shortness of breath today.  She denies any recent fevers or illnesses. She has no neurologic complaints. She has a fair appetite and denies weight loss.  She denies any chest pain, cough, or hemoptysis.  She denies any nausea, vomiting, constipation, or diarrhea.  She has no urinary complaints.  Patient offers no further specific complaints today.  REVIEW OF SYSTEMS:    Review of Systems  Constitutional: Positive for malaise/fatigue. Negative for fever and weight loss.  HENT: Negative.   Eyes: Negative.  Negative for blurred vision, double vision and pain.  Respiratory: Negative.  Negative for cough and shortness of breath.   Cardiovascular: Negative.  Negative for chest pain and leg swelling.  Gastrointestinal: Negative.  Negative for abdominal pain, diarrhea and nausea.  Genitourinary: Negative.  Negative for dysuria.  Musculoskeletal: Negative.  Negative for falls.  Skin: Negative.  Negative for rash.  Neurological: Positive for sensory change and weakness. Negative for focal weakness and headaches.   Psychiatric/Behavioral: Negative.  The patient is not nervous/anxious.   All other systems reviewed and are negative.   PAST MEDICAL HISTORY: Past Medical History:  Diagnosis Date  . Anemia 02/2013   F/U with Dr. Orlie Dakin for Feraheme injections  . Anxiety   . Arthritis    Possible RA - Follow up appt with Dr. Gavin Potters  . Cellulitis of arm, right   . Chest pain 03/26/2013  . Diabetes insipidus (HCC)    On desmopressin  . GERD (gastroesophageal reflux disease)   . H/O seasonal allergies   . H/O transfusion of packed red blood cells 02/2013   3 units PRBC for severe anemia 02/2013  . Herniated disc   . History of kidney stones   . Hyperlipidemia   . Hypertension   . Hyperthyroidism    s/p radiation therapy, now hypothyroidism  . IBS (irritable bowel syndrome)   . Lung cancer (HCC) 06/2017   left  . Migraine   . Pacemaker    a. s/p successful implantation of a Medtronic CapSureFix Novus MRIT SureScan serial # [id number] on 06/28/16 by Dr. Graciela Husbands for sinus node dysfunction.  . Pancreatic cyst   . Paroxysmal A-fib (HCC)   . Renal cell cancer, right (HCC) 10/23/2015   Right partial nephrectomy.    PAST SURGICAL HISTORY: Past Surgical History:  Procedure Laterality Date  . ABDOMINAL HYSTERECTOMY    . BREAST EXCISIONAL BIOPSY Right 2005   neg  . BREAST SURGERY  1990s   Biopsy  . CERVICAL SPINE SURGERY     Bone fusion  . [place]  2008?   [place]  . COLONOSCOPY WITH PROPOFOL N/A 10/15/2016   Procedure: COLONOSCOPY  WITH PROPOFOL;  Surgeon: Earline Mayotte, MD;  Location: [facility] ENDOSCOPY;  Service: Endoscopy;  Laterality: N/A;  . EP IMPLANTABLE DEVICE N/A 06/28/2016   Procedure: Pacemaker Implant;  Surgeon: Duke Salvia, MD;  Location: [facility] INVASIVE CV LAB;  Service: Cardiovascular;  Laterality: N/A;  . HAMMER TOE SURGERY    . HERNIA REPAIR Left    Inguinal Hernia Repair  . PORTA CATH INSERTION N/A 07/24/2017   Procedure: PORTA CATH INSERTION;  Surgeon: Annice Needy, MD;  Location: [facility] INVASIVE CV LAB;  Service: Cardiovascular;  Laterality: N/A;  . ROBOTIC ASSITED PARTIAL NEPHRECTOMY Right 10/23/2015   Procedure: ROBOTIC ASSITED PARTIAL NEPHRECTOMY with intraop ultrasound;  Surgeon: Vanna Scotland, MD;  Location: [facility] ORS;  Service: Urology;  Laterality: Right;  . TUBAL LIGATION      FAMILY HISTORY Family History  Problem Relation Age of Onset  . Heart disease Mother   . Heart disease Father   . Diabetes Brother   . Kidney disease Brother   . Stroke Daughter        due to medication reaction  . Breast cancer Cousin   . Prostate cancer Neg Hx   . Hematuria Neg Hx     ADVANCED DIRECTIVES:  No flowsheet data found.  HEALTH MAINTENANCE: Social History   Tobacco Use  . Smoking status: Former Smoker    Packs/day: 0.50    Years: 30.00    Pack years: 15.00    Types: Cigarettes    Quit date: 12/03/2012    Years since quitting: 6.6  . Smokeless tobacco: Never Used  Substance Use Topics  . Alcohol use: No    Alcohol/week: 0.0 standard drinks  . Drug use: No     Allergies  Allergen Reactions  . Metrizamide     Other reaction(s): Other (See Comments)  . Adhesive [Tape] Other (See Comments)    "irritate skin"  . Augmentin [Amoxicillin-Pot Clavulanate] Diarrhea    Severe diarrhea  . Codeine Nausea Only  . Morphine And Related Other (See Comments)    Shut down her organs  . Other     Skin irritation  . Tetanus Toxoid Other (See Comments)    REACTION: ARM SWELLING,REDNESS  . Tramadol Nausea And Vomiting    Current Outpatient Medications  Medication Sig Dispense Refill  . acetaminophen (TYLENOL) 325 MG tablet Take 2 tablets (650 mg total) by mouth every 6 (six) hours as needed for pain. (Patient taking differently: Take 650 mg by mouth every 6 (six) hours as needed for pain. )    . albuterol (PROVENTIL HFA;VENTOLIN HFA) 108 (90 Base) MCG/ACT inhaler Inhale 2 puffs into the lungs every 6 (six) hours as needed for wheezing or  shortness of breath. 1 Inhaler 2  . ALPRAZolam (XANAX) 0.25 MG tablet TAKE ONE TABLET BY MOUTH TWICE A DAY AS NEEDED FOR ANXIETY (Patient taking differently: Take 0.25 mg by mouth 2 (two) times daily as needed for anxiety. ) 60 tablet 3  . brimonidine (ALPHAGAN) 0.2 % ophthalmic solution Place 1 drop into both eyes 3 (three) times daily.     . Calcium Carbonate-Vitamin D (CALCIUM-CARB 600 + D) 600-125 MG-UNIT TABS Take 2 tablets by mouth daily.    . carvedilol (COREG) 6.25 MG tablet 1.5 tablets twice daily with meals 270 tablet 0  . cholecalciferol (VITAMIN D) 1000 units tablet Take 1,000 Units by mouth daily.    . dorzolamide (TRUSOPT) 2 % ophthalmic solution Place 1 drop into both eyes 3 (three) times daily.     Marland Kitchen  ferrous sulfate 325 (65 FE) MG tablet Take 325 mg by mouth daily with breakfast.    . furosemide (LASIX) 20 MG tablet TAKE ONE-HALF TABLET BY MOUTH DAILY (Patient taking differently: Take 10 mg by mouth daily. ) 45 tablet 3  . gabapentin (NEURONTIN) 300 MG capsule TAKE ONE CAPSULE BY MOUTH THREE TIMES A DAY AS NEEDED FOR SHINGLES PAIN 90 capsule 0  . hydroxychloroquine (PLAQUENIL) 200 MG tablet Take 400 mg by mouth daily.     Marland Kitchen latanoprost (XALATAN) 0.005 % ophthalmic solution Place 1 drop into both eyes at bedtime.     Marland Kitchen levothyroxine (SYNTHROID) 112 MCG tablet Take 1 tablet (112 mcg total) by mouth every morning. 90 tablet 0  . losartan (COZAAR) 100 MG tablet Take 1 tablet (100 mg total) by mouth at bedtime. 90 tablet 0  . mirtazapine (REMERON) 15 MG tablet Take 1 tablet (15 mg total) by mouth at bedtime. 30 tablet 2  . MYRBETRIQ 50 MG TB24 tablet TAKE ONE TABLET BY MOUTH DAILY (Patient taking differently: Take 50 mg by mouth daily. ) 30 tablet 4  . nitroGLYCERIN (NITROLINGUAL) 0.4 MG/SPRAY spray Place 1 spray under the tongue every 5 (five) minutes x 3 doses as needed for chest pain. 4.9 g 3  . Omega-3 Fatty Acids (FISH OIL) 1000 MG CAPS Take 1,000 mg by mouth daily.     Marland Kitchen oxybutynin  (DITROPAN-XL) 10 MG 24 hr tablet Take 1 tablet (10 mg total) by mouth at bedtime. 30 tablet 0  . pantoprazole (PROTONIX) 40 MG tablet TAKE ONE TABLET BY MOUTH DAILY (Patient taking differently: Take 40 mg by mouth daily. ) 90 tablet 1  . PARoxetine (PAXIL) 10 MG tablet TAKE ONE TABLET BY MOUTH DAILY AFTER DINNER (Patient taking differently: Take 10 mg by mouth daily after supper. ) 90 tablet 1  . vitamin C (ASCORBIC ACID) 500 MG tablet Take 500 mg by mouth daily.     No current facility-administered medications for this visit.     OBJECTIVE:  Vitals:   07/13/19 1001  BP: 91/61  Pulse: 71  Temp: 99.6 F (37.6 C)     Body mass index is 24.78 kg/m.    ECOG FS:0 - Asymptomatic  General: Ill-appearing, no acute distress.  Sitting in a wheelchair. Eyes: Pink conjunctiva, anicteric sclera. HEENT: Normocephalic, moist mucous membranes. Lungs: Clear to auscultation bilaterally. Heart: Regular rate and rhythm. No rubs, murmurs, or gallops. Abdomen: Soft, nontender, nondistended. No organomegaly noted, normoactive bowel sounds. Musculoskeletal: No edema, cyanosis, or clubbing. Neuro: Alert, answering all questions appropriately. Cranial nerves grossly intact. Skin: No rashes or petechiae noted. Psych: Normal affect.  LAB RESULTS:  Infusion on 07/13/2019  Component Date Value Ref Range Status  . Order Confirmation 07/13/2019    Final                   Value:ORDER PROCESSED BY BLOOD BANK Performed at [facility], [address] [place] [place], [place] [address]   . ABO/RH(D) 07/13/2019 O POS   Final  . Antibody Screen 07/13/2019 NEG   Final  . Sample Expiration 07/13/2019 07/16/2019,2359   Final  . Unit Number 07/13/2019 [id number]   Final  . Blood Component Type 07/13/2019 RBC, LR IRR   Final  . Unit division 07/13/2019 00   Final  . Status of Unit 07/13/2019 ISSUED   Final  . Unit tag comment 07/13/2019 IRRADIATED PRODUCT   Final  . Transfusion Status 07/13/2019 OK TO  TRANSFUSE   Final  .  Crossmatch Result 07/13/2019    Final                   Value:Compatible Performed at [facility], [address] [place] [place], [place] [address]   . ISSUE DATE / TIME 07/13/2019 [id number]   Final  . Blood Product Unit Number 07/13/2019 [id number]   Final  . PRODUCT CODE 07/13/2019 [id number]   Final  . Unit Type and Rh 07/13/2019 5100   Final  . Blood Product Expiration Date 07/13/2019 [id number]   Final  Appointment on 07/13/2019  Component Date Value Ref Range Status  . Iron 07/13/2019 50  28 - 170 ug/dL Final  . TIBC 64/40/3474 225* 250 - 450 ug/dL Final  . Saturation Ratios 07/13/2019 22  10.4 - 31.8 % Final  . UIBC 07/13/2019 175  ug/dL Final   Performed at [facility], [address] [place], [place] [address]  . Ferritin 07/13/2019 465* 11 - 307 ng/mL Final   Performed at [facility], [address] [place] [place], [place] [address]  . Blood Bank Specimen 07/13/2019 SAMPLE AVAILABLE FOR TESTING   Final  . Sample Expiration 07/13/2019    Final                   Value:07/16/2019,2359 Performed at [facility], [address], [place], [place] [address]   . WBC 07/13/2019 2.7* 4.0 - 10.5 K/uL Final  . RBC 07/13/2019 2.39* 3.87 - 5.11 MIL/uL Final  . Hemoglobin 07/13/2019 6.7* 12.0 - 15.0 g/dL Final  . HCT 51/88/4166 21.9* 36.0 - 46.0 % Final  . MCV 07/13/2019 91.6  80.0 - 100.0 fL Final  . MCH 07/13/2019 28.0  26.0 - 34.0 pg Final  . MCHC 07/13/2019 30.6  30.0 - 36.0 g/dL Final  . RDW 02/02/1600 19.0* 11.5 - 15.5 % Final  . Platelets 07/13/2019 35* 150 - 400 K/uL Final   Comment: SPECIMEN CHECKED FOR CLOTS Immature Platelet Fraction may be clinically indicated, consider ordering this additional test [id number]   . nRBC 07/13/2019 1.5* 0.0 - 0.2 % Final  . Neutrophils Relative % 07/13/2019 50  % Final  . Neutro Abs 07/13/2019 1.4* 1.7 - 7.7 K/uL Final  . Lymphocytes Relative 07/13/2019 19  % Final  . Lymphs Abs  07/13/2019 0.5* 0.7 - 4.0 K/uL Final  . Monocytes Relative 07/13/2019 19  % Final  . Monocytes Absolute 07/13/2019 0.5  0.1 - 1.0 K/uL Final  . Eosinophils Relative 07/13/2019 1  % Final  . Eosinophils Absolute 07/13/2019 0.0  0.0 - 0.5 K/uL Final  . Basophils Relative 07/13/2019 0  % Final  . Basophils Absolute 07/13/2019 0.0  0.0 - 0.1 K/uL Final  . WBC Morphology 07/13/2019 MILD LEFT SHIFT (1-5% METAS, OCC MYELO, OCC BANDS)   Final   RARE BLASTS 2% NOTED ON SMEAR  . RBC Morphology 07/13/2019 MIXED RBC POPULATION   Final  . Smear Review 07/13/2019 PLATELETS APPEAR DECREASED   Final   Reviewed  . Metamyelocytes Relative 07/13/2019 6  % Final  . Myelocytes 07/13/2019 3  % Final  . Blasts 07/13/2019 2  % Final  . Abs Immature Granulocytes 07/13/2019 0.20* 0.00 - 0.07 K/uL Final  . Giant PLTs 07/13/2019 PRESENT   Final   Performed at [facility] [address] [place], [place] [address]       STUDIES: Dg Chest Portable 1 View  Result Date: 06/18/2019 CLINICAL DATA:  Weakness EXAM: PORTABLE CHEST 1 VIEW  COMPARISON:  12/31/2017 FINDINGS: Left pacer and right Port-A-Cath remain in place, unchanged. Cardiomegaly. Left lower lobe airspace opacity could reflect atelectasis or infiltrate. Right lung clear. No acute bony abnormality. IMPRESSION: Cardiomegaly. Left lower lobe atelectasis or infiltrate. Electronically Signed   By: Charlett Nose M.D.   On: 06/18/2019 22:53   Ct Bone Marrow Biopsy & Aspiration  Result Date: 07/05/2019 INDICATION: Anemia. EXAM: CT-DIRECTED BONE MARROW BIOPSY AND ASPIRATION. MEDICATIONS: None. ANESTHESIA/SEDATION: Moderate (conscious) sedation was employed during this procedure. A total of Versed 4.0 mg Moderate Sedation Time: 35 minutes. The patient's level of consciousness and vital signs were monitored continuously by radiology nursing throughout the procedure under my direct supervision. FLUOROSCOPY TIME:  Fluoroscopy Time: 0 minutes 0 seconds  COMPLICATIONS: None immediate. PROCEDURE: After discussing risks and benefits of this procedure with the patient informed consent was obtained. Back was sterilely prepped and draped. Following local anesthesia with 1% lidocaine and IV conscious sedation with 4.0 mg of Versed. Standard bone marrow biopsy and aspiration was performed. Two core samples obtained. Aspirate and core samples sent to lab for further evaluation. IMPRESSION: Successful CT-directed bone marrow biopsy and aspiration. Electronically Signed   By: Maisie Fus  Register   On: 07/05/2019 10:13   Mm 3d Screen Breast Bilateral  Result Date: 06/25/2019 CLINICAL DATA:  Screening. EXAM: DIGITAL SCREENING BILATERAL MAMMOGRAM WITH TOMO AND CAD COMPARISON:  Previous exam(s). ACR Breast Density Category c: The breast tissue is heterogeneously dense, which may obscure small masses. FINDINGS: There are no findings suspicious for malignancy. Images were processed with CAD. IMPRESSION: No mammographic evidence of malignancy. A result letter of this screening mammogram will be mailed directly to the patient. RECOMMENDATION: Screening mammogram in one year. (Code:SM-B-01Y) BI-RADS CATEGORY  1: Negative. Electronically Signed   By: Annia Belt M.D.   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 Plan:  1.  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  The complexity of patient's karyotype and the loss of 17p are indicative of a high-grade MDS with a poor prognosis.  We discussed the possibility of chemotherapy, possibly with Vidaza, but patient is not ready to make this decision.  Her hemoglobin is improved, but still less than 7.0 therefore will proceed with 1 unit of packed red blood cells today.  Return to clinic in 1 week for further evaluation and consideration of additional blood.     2. 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  Patient completed cycle 3 of carboplatinum and etoposide on September 19, 2017.  She also completed XRT.  Given her persistent pancytopenia, treatment was discontinued.  Patient's most recent CT scan on January 13, 2019 was reviewed independently with no obvious evidence of recurrent or progressive disease.  Patient's neck CT scan is scheduled for July 22, 2019. 3.  Pancytopenia: Secondary to underlying MDS.  Monitor. 3. Renal cell carcinoma of the right kidney: Stage T1a N0 M0.  Patient underwent partial nephrectomy on October 23, 2015.  Repeat imaging as above.  Patient reports she has been discharged from urology clinic. 4.  Rheumatoid arthritis: Continue Plaquenil. Continue follow-up per rheumatology.    Patient expressed understanding and was in agreement with this plan. She also understands that She can call clinic at any time with any questions, concerns, or complaints.    Marcial Pacas  Celedonio Miyamoto, MD   07/13/2019 3:19 PM

## 2019-07-12 ENCOUNTER — Encounter: Payer: Self-pay | Admitting: Oncology

## 2019-07-12 ENCOUNTER — Other Ambulatory Visit: Payer: Self-pay

## 2019-07-12 NOTE — Progress Notes (Signed)
Patient is here for a follow-up, her complaint today is the amount of pain that she is having from her biopsy.

## 2019-07-13 ENCOUNTER — Inpatient Hospital Stay: Payer: Medicare Other

## 2019-07-13 ENCOUNTER — Other Ambulatory Visit: Payer: Self-pay

## 2019-07-13 ENCOUNTER — Inpatient Hospital Stay (HOSPITAL_BASED_OUTPATIENT_CLINIC_OR_DEPARTMENT_OTHER): Payer: Medicare Other | Admitting: Oncology

## 2019-07-13 ENCOUNTER — Encounter (HOSPITAL_COMMUNITY): Payer: Self-pay | Admitting: Oncology

## 2019-07-13 VITALS — BP 106/66 | HR 81 | Temp 97.7°F | Resp 18

## 2019-07-13 VITALS — BP 91/61 | HR 71 | Temp 99.6°F | Wt 135.5 lb

## 2019-07-13 DIAGNOSIS — D469 Myelodysplastic syndrome, unspecified: Secondary | ICD-10-CM

## 2019-07-13 DIAGNOSIS — C7A8 Other malignant neuroendocrine tumors: Secondary | ICD-10-CM

## 2019-07-13 DIAGNOSIS — I70219 Atherosclerosis of native arteries of extremities with intermittent claudication, unspecified extremity: Secondary | ICD-10-CM | POA: Diagnosis not present

## 2019-07-13 DIAGNOSIS — D61818 Other pancytopenia: Secondary | ICD-10-CM

## 2019-07-13 DIAGNOSIS — D500 Iron deficiency anemia secondary to blood loss (chronic): Secondary | ICD-10-CM

## 2019-07-13 LAB — FERRITIN: Ferritin: 465 ng/mL — ABNORMAL HIGH (ref 11–307)

## 2019-07-13 LAB — IRON AND TIBC
Iron: 50 ug/dL (ref 28–170)
Saturation Ratios: 22 % (ref 10.4–31.8)
TIBC: 225 ug/dL — ABNORMAL LOW (ref 250–450)
UIBC: 175 ug/dL

## 2019-07-13 LAB — CBC WITH DIFFERENTIAL/PLATELET
Abs Immature Granulocytes: 0.2 10*3/uL — ABNORMAL HIGH (ref 0.00–0.07)
Basophils Absolute: 0 10*3/uL (ref 0.0–0.1)
Basophils Relative: 0 %
Blasts: 2 %
Eosinophils Absolute: 0 10*3/uL (ref 0.0–0.5)
Eosinophils Relative: 1 %
HCT: 21.9 % — ABNORMAL LOW (ref 36.0–46.0)
Hemoglobin: 6.7 g/dL — ABNORMAL LOW (ref 12.0–15.0)
Lymphocytes Relative: 19 %
Lymphs Abs: 0.5 10*3/uL — ABNORMAL LOW (ref 0.7–4.0)
MCH: 28 pg (ref 26.0–34.0)
MCHC: 30.6 g/dL (ref 30.0–36.0)
MCV: 91.6 fL (ref 80.0–100.0)
Metamyelocytes Relative: 6 %
Monocytes Absolute: 0.5 10*3/uL (ref 0.1–1.0)
Monocytes Relative: 19 %
Myelocytes: 3 %
Neutro Abs: 1.4 10*3/uL — ABNORMAL LOW (ref 1.7–7.7)
Neutrophils Relative %: 50 %
Platelets: 35 10*3/uL — ABNORMAL LOW (ref 150–400)
RBC: 2.39 MIL/uL — ABNORMAL LOW (ref 3.87–5.11)
RDW: 19 % — ABNORMAL HIGH (ref 11.5–15.5)
Smear Review: DECREASED
WBC: 2.7 10*3/uL — ABNORMAL LOW (ref 4.0–10.5)
nRBC: 1.5 % — ABNORMAL HIGH (ref 0.0–0.2)

## 2019-07-13 LAB — SAMPLE TO BLOOD BANK

## 2019-07-13 LAB — PREPARE RBC (CROSSMATCH)

## 2019-07-13 MED ORDER — SODIUM CHLORIDE 0.9% IV SOLUTION
250.0000 mL | Freq: Once | INTRAVENOUS | Status: AC
  Administered 2019-07-13: 250 mL via INTRAVENOUS
  Filled 2019-07-13: qty 250

## 2019-07-13 MED ORDER — HEPARIN SOD (PORK) LOCK FLUSH 100 UNIT/ML IV SOLN
500.0000 [IU] | Freq: Once | INTRAVENOUS | Status: AC
  Administered 2019-07-13: 500 [IU] via INTRAVENOUS
  Filled 2019-07-13: qty 5

## 2019-07-13 MED ORDER — DIPHENHYDRAMINE HCL 50 MG/ML IJ SOLN
25.0000 mg | Freq: Once | INTRAMUSCULAR | Status: AC
  Administered 2019-07-13: 25 mg via INTRAVENOUS
  Filled 2019-07-13: qty 1

## 2019-07-13 MED ORDER — SODIUM CHLORIDE 0.9% FLUSH
10.0000 mL | Freq: Once | INTRAVENOUS | Status: AC
  Administered 2019-07-13: 10 mL via INTRAVENOUS
  Filled 2019-07-13: qty 10

## 2019-07-13 MED ORDER — ACETAMINOPHEN 325 MG PO TABS
650.0000 mg | ORAL_TABLET | Freq: Once | ORAL | Status: AC
  Administered 2019-07-13: 12:00:00 650 mg via ORAL
  Filled 2019-07-13: qty 2

## 2019-07-14 ENCOUNTER — Telehealth: Payer: Self-pay | Admitting: Internal Medicine

## 2019-07-14 ENCOUNTER — Encounter (HOSPITAL_COMMUNITY): Payer: Self-pay | Admitting: Oncology

## 2019-07-14 ENCOUNTER — Ambulatory Visit: Payer: Self-pay | Admitting: *Deleted

## 2019-07-14 LAB — TYPE AND SCREEN
ABO/RH(D): O POS
Antibody Screen: NEGATIVE
Unit division: 0

## 2019-07-14 LAB — BPAM RBC
Blood Product Expiration Date: 202012302359
ISSUE DATE / TIME: 202012081210
Unit Type and Rh: 5100

## 2019-07-14 NOTE — Telephone Encounter (Signed)
Pt called with complaints of low BP since 05/23/2019; she says her BP is low after blood infusions; she would like to know how to handle her BP and night meds; her BP was 132/67 07/14/2019 AM 99/57   07/13/2019 AM The readings were taken on her left upper arm; she says her  BP runs 150s/60s; she is having weakness, but the lightheadness went away after her transfusion; she would like an appt to discuss mediation management; recommendations made per nurse triage protocol; the pt can be contacted at [phone]; she sees Dr Derrel Nip, [facility]; will route to office for final disposition.  Reason for Disposition . [1] Caller has URGENT medication question about med that PCP or specialist prescribed AND [2] triager unable to answer question  Answer Assessment - Initial Assessment Questions 1.   NAME of MEDICATION: "What medicine are you calling about?"     All medication esp night time meds 2.   QUESTION: "What is your question?" Would like to discuss mediation 3.   PRESCRIBING HCP: "Who prescribed it?" Reason: if prescribed by specialist, call should be referred to that group.  Dr Derrel Nip 4. SYMPTOMS: "Do you have any symptoms?"   Weakness, and decreased BP 5. SEVERITY: If symptoms are present, ask "Are they mild, moderate or severe?"     moderate 6.  PREGNANCY:  "Is there any chance that you are pregnant?" "When was your last menstrual period?"  no  Protocols used: MEDICATION QUESTION CALL-A-AH

## 2019-07-14 NOTE — Telephone Encounter (Signed)
The 07/13/19 BP of 99/57 was prior to infusion and the 132/67 was today after infusion, Patient concerned BP dropping so low prior to infusion have scheduled phone visit for 07/16/19 at 10 am

## 2019-07-14 NOTE — Telephone Encounter (Signed)
Error

## 2019-07-16 ENCOUNTER — Ambulatory Visit (INDEPENDENT_AMBULATORY_CARE_PROVIDER_SITE_OTHER): Payer: Medicare Other | Admitting: Internal Medicine

## 2019-07-16 ENCOUNTER — Encounter: Payer: Self-pay | Admitting: Internal Medicine

## 2019-07-16 VITALS — Ht 62.0 in | Wt 135.5 lb

## 2019-07-16 DIAGNOSIS — I25118 Atherosclerotic heart disease of native coronary artery with other forms of angina pectoris: Secondary | ICD-10-CM

## 2019-07-16 DIAGNOSIS — D61818 Other pancytopenia: Secondary | ICD-10-CM | POA: Diagnosis not present

## 2019-07-16 DIAGNOSIS — I495 Sick sinus syndrome: Secondary | ICD-10-CM | POA: Diagnosis not present

## 2019-07-16 DIAGNOSIS — E1121 Type 2 diabetes mellitus with diabetic nephropathy: Secondary | ICD-10-CM

## 2019-07-16 DIAGNOSIS — I509 Heart failure, unspecified: Secondary | ICD-10-CM

## 2019-07-16 DIAGNOSIS — M1990 Unspecified osteoarthritis, unspecified site: Secondary | ICD-10-CM

## 2019-07-16 DIAGNOSIS — C7A8 Other malignant neuroendocrine tumors: Secondary | ICD-10-CM

## 2019-07-16 DIAGNOSIS — J441 Chronic obstructive pulmonary disease with (acute) exacerbation: Secondary | ICD-10-CM

## 2019-07-16 DIAGNOSIS — M1380 Other specified arthritis, unspecified site: Secondary | ICD-10-CM

## 2019-07-16 DIAGNOSIS — E232 Diabetes insipidus: Secondary | ICD-10-CM

## 2019-07-16 DIAGNOSIS — N1831 Chronic kidney disease, stage 3a: Secondary | ICD-10-CM

## 2019-07-16 DIAGNOSIS — M069 Rheumatoid arthritis, unspecified: Secondary | ICD-10-CM | POA: Diagnosis not present

## 2019-07-16 DIAGNOSIS — E1122 Type 2 diabetes mellitus with diabetic chronic kidney disease: Secondary | ICD-10-CM

## 2019-07-16 LAB — SURGICAL PATHOLOGY

## 2019-07-16 MED ORDER — HYDROCODONE-ACETAMINOPHEN 10-325 MG PO TABS: 1.0000 | ORAL_TABLET | Freq: Four times a day (QID) | ORAL | 0 refills | Status: DC | PRN

## 2019-07-16 MED ORDER — PREDNISONE 10 MG PO TABS: ORAL_TABLET | ORAL | 0 refills | Status: AC

## 2019-07-16 MED ORDER — ONDANSETRON 4 MG PO TBDP: 4.0000 mg | ORAL_TABLET | Freq: Three times a day (TID) | ORAL | 0 refills | Status: AC | PRN

## 2019-07-16 NOTE — Patient Instructions (Signed)
I am prescribing Vicodin for your leg pain  Prednisone 60 mg daily for 3 days,  Then taper by 10 mg daily until gone    Take the zofran before your first dose of vicodin to prevent nausea   Update me on Monday

## 2019-07-16 NOTE — Progress Notes (Signed)
Telephone Note  This visit type was conducted due to national recommendations for restrictions regarding the COVID-19 pandemic (e.g. social distancing).  This format is felt to be most appropriate for this patient at this time.  All issues noted in this document were discussed and addressed.  No physical exam was performed (except for noted visual exam findings with Video Visits).   I connected with@ on 07/16/19 at 10:00 AM EST by telephone and verified that I am speaking with the correct person using two identifiers. Location patient: home Location provider: work or home office Persons participating in the virtual visit: patient, provider  I discussed the limitations, risks, security and privacy concerns of performing an evaluation and management service by telephone and the availability of in person appointments. I also discussed with the patient that there may be a patient responsible charge related to this service. The patient expressed understanding and agreed to proceed.  Reason for visit: follow up,   medication concerns   HPI:   78  Yr old female with acquired pancytopenia,  PAD,  GAD,  B12 deficiency and HTN   Presents with hypotension in the setting of profound anemia.   "I'm not well."  Had a BM biopsy for evaluation of transfusion dependent anemia . BM biopsy was done of the Right iliac crest  Nov 30,  Path report reviewed: MDS favored as the diagnosis. Still feeling too weak to walk due to severe  pain in the right buttock that radiates down the right leg .  "I can hardly stand up" the pain is unbearable and reminiscent of her RA pain  .  primarlily affecting the hip, knee and ankle joints. She has been unable to sleep  due to pain  Despite using alprazolam and gabapentin at dinner and mirtazipine at bedtime    Anemia:  Has had 4  Weekly infusions of blood ,  hgb still 6.7 by last check  On dec 8        see pertinent positives and negatives per HPI.  Past Medical History:   Diagnosis Date  . Anemia 02/2013   F/U with Dr. Grayland Ormond for Feraheme injections  . Anxiety   . Arthritis    Possible RA - Follow up appt with Dr. Jefm Bryant  . Cellulitis of arm, right   . Chest pain 03/26/2013  . Diabetes insipidus ([place])    On desmopressin  . GERD (gastroesophageal reflux disease)   . H/O seasonal allergies   . H/O transfusion of packed red blood cells 02/2013   3 units PRBC for severe anemia 02/2013  . Herniated disc   . History of kidney stones   . Hyperlipidemia   . Hypertension   . Hyperthyroidism    s/p radiation therapy, now hypothyroidism  . IBS (irritable bowel syndrome)   . Lung cancer ([place]) 06/2017   left  . Migraine   . Pacemaker    a. s/p successful implantation of a Medtronic CapSureFix Novus MRIT SureScan serial # [id number] on 06/28/16 by Dr. Caryl Comes for sinus node dysfunction.  . Pancreatic cyst   . Paroxysmal A-fib ([place])   . Renal cell cancer, right ([place]) 10/23/2015   Right partial nephrectomy.    Past Surgical History:  Procedure Laterality Date  . ABDOMINAL HYSTERECTOMY    . BREAST EXCISIONAL BIOPSY Right 2005   neg  . BREAST SURGERY  1990s   Biopsy  . CERVICAL SPINE SURGERY     Bone fusion  . COLONOSCOPY  2008?   [facility]  [place]  . COLONOSCOPY WITH PROPOFOL N/A 10/15/2016   Procedure: COLONOSCOPY WITH PROPOFOL;  Surgeon: Robert Bellow, MD;  Location: [facility] ENDOSCOPY;  Service: Endoscopy;  Laterality: N/A;  . EP IMPLANTABLE DEVICE N/A 06/28/2016   Procedure: Pacemaker Implant;  Surgeon: Deboraha Sprang, MD;  Location: [facility] CV LAB;  Service: Cardiovascular;  Laterality: N/A;  . HAMMER TOE SURGERY    . HERNIA REPAIR Left    Inguinal Hernia Repair  . PORTA CATH INSERTION N/A 07/24/2017   Procedure: PORTA CATH INSERTION;  Surgeon: Algernon Huxley, MD;  Location: [facility];  Service: Cardiovascular;  Laterality: N/A;  . ROBOTIC ASSITED PARTIAL NEPHRECTOMY Right 10/23/2015   Procedure: ROBOTIC ASSITED PARTIAL NEPHRECTOMY  with intraop ultrasound;  Surgeon: Hollice Espy, MD;  Location: [facility] ORS;  Service: Urology;  Laterality: Right;  . TUBAL LIGATION      Family History  Problem Relation Age of Onset  . Heart disease Mother   . Heart disease Father   . Diabetes Brother   . Kidney disease Brother   . Stroke Daughter        due to medication reaction  . Breast cancer Cousin   . Prostate cancer Neg Hx   . Hematuria Neg Hx     SOCIAL HX:  reports that she quit smoking about 6 years ago. Her smoking use included cigarettes. She has a 15.00 pack-year smoking history. She has never used smokeless tobacco. She reports that she does not drink alcohol or use drugs.   Current Outpatient Medications:  .  acetaminophen (TYLENOL) 325 MG tablet, Take 2 tablets (650 mg total) by mouth every 6 (six) hours as needed for pain. (Patient taking differently: Take 650 mg by mouth every 6 (six) hours as needed for pain. ), Disp: , Rfl:  .  albuterol (PROVENTIL HFA;VENTOLIN HFA) 108 (90 Base) MCG/ACT inhaler, Inhale 2 puffs into the lungs every 6 (six) hours as needed for wheezing or shortness of breath., Disp: 1 Inhaler, Rfl: 2 .  ALPRAZolam (XANAX) 0.25 MG tablet, TAKE ONE TABLET BY MOUTH TWICE A DAY AS NEEDED FOR ANXIETY (Patient taking differently: Take 0.25 mg by mouth 2 (two) times daily as needed for anxiety. ), Disp: 60 tablet, Rfl: 3 .  brimonidine (ALPHAGAN) 0.2 % ophthalmic solution, Place 1 drop into both eyes 3 (three) times daily. , Disp: , Rfl:  .  Calcium Carbonate-Vitamin D (CALCIUM-CARB 600 + D) 600-125 MG-UNIT TABS, Take 2 tablets by mouth daily., Disp: , Rfl:  .  carvedilol (COREG) 6.25 MG tablet, 1.5 tablets twice daily with meals, Disp: 270 tablet, Rfl: 0 .  cholecalciferol (VITAMIN D) 1000 units tablet, Take 1,000 Units by mouth daily., Disp: , Rfl:  .  dorzolamide (TRUSOPT) 2 % ophthalmic solution, Place 1 drop into both eyes 3 (three) times daily. , Disp: , Rfl:  .  ferrous sulfate 325 (65 FE) MG  tablet, Take 325 mg by mouth daily with breakfast., Disp: , Rfl:  .  furosemide (LASIX) 20 MG tablet, TAKE ONE-HALF TABLET BY MOUTH DAILY (Patient taking differently: Take 10 mg by mouth daily. ), Disp: 45 tablet, Rfl: 3 .  gabapentin (NEURONTIN) 300 MG capsule, TAKE ONE CAPSULE BY MOUTH THREE TIMES A DAY AS NEEDED FOR SHINGLES PAIN, Disp: 90 capsule, Rfl: 0 .  hydroxychloroquine (PLAQUENIL) 200 MG tablet, Take 400 mg by mouth daily. , Disp: , Rfl:  .  latanoprost (XALATAN) 0.005 % ophthalmic solution, Place 1 drop into both eyes at bedtime. ,  Disp: , Rfl:  .  levothyroxine (SYNTHROID) 112 MCG tablet, Take 1 tablet (112 mcg total) by mouth every morning., Disp: 90 tablet, Rfl: 0 .  losartan (COZAAR) 100 MG tablet, Take 1 tablet (100 mg total) by mouth at bedtime., Disp: 90 tablet, Rfl: 0 .  mirtazapine (REMERON) 15 MG tablet, Take 1 tablet (15 mg total) by mouth at bedtime., Disp: 30 tablet, Rfl: 2 .  MYRBETRIQ 50 MG TB24 tablet, TAKE ONE TABLET BY MOUTH DAILY (Patient taking differently: Take 50 mg by mouth daily. ), Disp: 30 tablet, Rfl: 4 .  nitroGLYCERIN (NITROLINGUAL) 0.4 MG/SPRAY spray, Place 1 spray under the tongue every 5 (five) minutes x 3 doses as needed for chest pain., Disp: 4.9 g, Rfl: 3 .  Omega-3 Fatty Acids (FISH OIL) 1000 MG CAPS, Take 1,000 mg by mouth daily. , Disp: , Rfl:  .  oxybutynin (DITROPAN-XL) 10 MG 24 hr tablet, Take 1 tablet (10 mg total) by mouth at bedtime., Disp: 30 tablet, Rfl: 0 .  pantoprazole (PROTONIX) 40 MG tablet, TAKE ONE TABLET BY MOUTH DAILY (Patient taking differently: Take 40 mg by mouth daily. ), Disp: 90 tablet, Rfl: 1 .  PARoxetine (PAXIL) 10 MG tablet, TAKE ONE TABLET BY MOUTH DAILY AFTER DINNER (Patient taking differently: Take 10 mg by mouth daily after supper. ), Disp: 90 tablet, Rfl: 1 .  vitamin C (ASCORBIC ACID) 500 MG tablet, Take 500 mg by mouth daily., Disp: , Rfl:  .  HYDROcodone-acetaminophen (NORCO) 10-325 MG tablet, Take 1 tablet by mouth  every 6 (six) hours as needed., Disp: 30 tablet, Rfl: 0 .  ondansetron (ZOFRAN ODT) 4 MG disintegrating tablet, Take 1 tablet (4 mg total) by mouth every 8 (eight) hours as needed for nausea or vomiting., Disp: 20 tablet, Rfl: 0 .  predniSONE (DELTASONE) 10 MG tablet, 6 tablets daily for 3 days,  then reduce by 1 tablet daily until gone, Disp: 33 tablet, Rfl: 0  EXAM:  VITALS per patient if applicable:  GENERAL: alert, oriented, appears well and in no acute distress  HEENT: atraumatic, conjunttiva clear, no obvious abnormalities on inspection of external nose and ears  NECK: normal movements of the head and neck  LUNGS: on inspection no signs of respiratory distress, breathing rate appears normal, no obvious gross SOB, gasping or wheezing  CV: no obvious cyanosis  MS: moves all visible extremities without noticeable abnormality  PSYCH/NEURO: pleasant and cooperative, no obvious depression or anxiety, speech and thought processing grossly intact  ASSESSMENT AND PLAN:  Discussed the following assessment and plan:  Acquired pancytopenia (HCC)  Inflammatory arthritis  Acquired pancytopenia (HCC) Repeat biopsy now suggestive of MDS  Inflammatory arthritis Prednisone taper prescribed,  Along with hydrocodone for nocturnal pain     I discussed the assessment and treatment plan with the patient. The patient was provided an opportunity to ask questions and all were answered. The patient agreed with the plan and demonstrated an understanding of the instructions.   The patient was advised to call back or seek an in-person evaluation if the symptoms worsen or if the condition fails to improve as anticipated.   I provided  25 minutes of non-face-to-face time during this encounter reviewing patient's current problems and past procedures/imaging studies, providing counseling on the above mentioned problems , and coordination  of care . [name], MD

## 2019-07-18 DIAGNOSIS — M069 Rheumatoid arthritis, unspecified: Secondary | ICD-10-CM | POA: Insufficient documentation

## 2019-07-18 DIAGNOSIS — I509 Heart failure, unspecified: Secondary | ICD-10-CM | POA: Insufficient documentation

## 2019-07-18 DIAGNOSIS — I25118 Atherosclerotic heart disease of native coronary artery with other forms of angina pectoris: Secondary | ICD-10-CM | POA: Insufficient documentation

## 2019-07-18 DIAGNOSIS — E1122 Type 2 diabetes mellitus with diabetic chronic kidney disease: Secondary | ICD-10-CM | POA: Insufficient documentation

## 2019-07-18 DIAGNOSIS — E232 Diabetes insipidus: Secondary | ICD-10-CM | POA: Insufficient documentation

## 2019-07-18 NOTE — Assessment & Plan Note (Signed)
Prednisone taper prescribed,  Along with hydrocodone for nocturnal pain

## 2019-07-18 NOTE — Assessment & Plan Note (Signed)
Repeat biopsy now suggestive of MDS

## 2019-07-18 NOTE — Progress Notes (Signed)
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  HISTORY OF PRESENT ILLNESS: Patient returns to clinic today for repeat laboratory work, further evaluation, and continuation of blood transfusion.  Despite receiving 1 unit packed red blood cells last week, patient continues to have significant weakness and fatigue.  She does not complain of shortness of breath today.  She denies any recent fevers or illnesses. She has no neurologic complaints. She has a fair appetite and denies weight loss.  She denies any chest pain, cough, or hemoptysis.  She denies any nausea, vomiting, constipation, or diarrhea.  She has no urinary complaints.  Patient feels generally terrible, but offers no further specific complaints.  REVIEW OF SYSTEMS:    Review of Systems  Constitutional: Positive for malaise/fatigue. Negative for fever and weight loss.  HENT: Negative.   Eyes: Negative.  Negative for blurred vision, double vision and pain.  Respiratory: Negative.  Negative for cough and shortness of breath.   Cardiovascular: Negative.  Negative for chest pain and leg swelling.  Gastrointestinal: Negative.  Negative for abdominal pain, diarrhea and nausea.  Genitourinary: Negative.  Negative for dysuria.  Musculoskeletal: Negative.  Negative for falls.  Skin: Negative.  Negative for rash.  Neurological: Positive for sensory change and weakness. Negative for focal weakness and headaches.  Psychiatric/Behavioral:  Negative.  The patient is not nervous/anxious.   All other systems reviewed and are negative.   PAST MEDICAL HISTORY: Past Medical History:  Diagnosis Date  . Anemia 02/2013   F/U with Dr. Grayland Ormond for Feraheme injections  . Anxiety   . Arthritis    Possible RA - Follow up appt with Dr. Jefm Bryant  . Cellulitis of arm, right   . Chest pain 03/26/2013  . Diabetes insipidus ([facility])    On desmopressin  . GERD (gastroesophageal reflux disease)   . H/O seasonal allergies   . H/O transfusion of packed red blood cells 02/2013   3 units PRBC for severe anemia 02/2013  . Herniated disc   . History of kidney stones   . Hyperlipidemia   . Hypertension   . Hyperthyroidism    s/p radiation therapy, now hypothyroidism  . IBS (irritable bowel syndrome)   . Lung cancer ([place]) 06/2017   left  . Migraine   . Pacemaker    a. s/p successful implantation of a Medtronic CapSureFix Novus MRIT SureScan serial # [id number] on 06/28/16 by Dr. Caryl Comes for sinus node dysfunction.  . Pancreatic cyst   . Paroxysmal A-fib ([place])   . Renal cell cancer, right ([place]) 10/23/2015   Right partial nephrectomy.    PAST SURGICAL HISTORY: Past Surgical History:  Procedure Laterality Date  . ABDOMINAL HYSTERECTOMY    . BREAST EXCISIONAL BIOPSY Right 2005   neg  . BREAST SURGERY  1990s   Biopsy  . CERVICAL SPINE SURGERY     Bone fusion  . COLONOSCOPY  2008?   [place]  . COLONOSCOPY WITH PROPOFOL N/A 10/15/2016   Procedure: COLONOSCOPY WITH PROPOFOL;  Surgeon: Robert Bellow, MD;  Location:  [facility];  Service: Endoscopy;  Laterality: N/A;  . EP IMPLANTABLE DEVICE N/A 06/28/2016   Procedure: Pacemaker Implant;  Surgeon: Deboraha Sprang, MD;  Location: [facility];  Service: Cardiovascular;  Laterality: N/A;  . HAMMER TOE SURGERY    . HERNIA REPAIR Left    Inguinal Hernia Repair  . PORTA CATH INSERTION N/A 07/24/2017   Procedure: PORTA CATH INSERTION;  Surgeon: Algernon Huxley, MD;  Location: [facility];  Service: Cardiovascular;  Laterality: N/A;  . ROBOTIC ASSITED PARTIAL NEPHRECTOMY Right 10/23/2015   Procedure: ROBOTIC ASSITED PARTIAL NEPHRECTOMY with intraop ultrasound;  Surgeon: Hollice Espy, MD;  Location: [facility];  Service: Urology;  Laterality: Right;  . TUBAL LIGATION      FAMILY HISTORY Family History  Problem Relation Age of Onset  . Heart disease Mother   . Heart disease Father   . Diabetes Brother   . Kidney disease Brother   . Stroke Daughter        due to medication reaction  . Breast cancer Cousin   . Prostate cancer Neg Hx   . Hematuria Neg Hx     ADVANCED DIRECTIVES:  No flowsheet data found.  HEALTH MAINTENANCE: Social History   Tobacco Use  . Smoking status: Former Smoker    Packs/day: 0.50    Years: 30.00    Pack years: 15.00    Types: Cigarettes    Quit date: 12/03/2012    Years since quitting: 6.6  . Smokeless tobacco: Never Used  Substance Use Topics  . Alcohol use: No    Alcohol/week: 0.0 standard drinks  . Drug use: No     Allergies  Allergen Reactions  . Metrizamide     Other reaction(s): Other (See Comments)  . Adhesive [Tape] Other (See Comments)    "irritate skin"  . Augmentin [Amoxicillin-Pot Clavulanate] Diarrhea    Severe diarrhea  . Codeine Nausea Only  . Morphine And Related Other (See Comments)    Shut down her organs  . Other     Skin irritation  . Tetanus Toxoid Other (See Comments)    REACTION: ARM SWELLING,REDNESS  . Tramadol Nausea And Vomiting    Current Outpatient Medications  Medication Sig Dispense Refill  . acetaminophen (TYLENOL) 325 MG tablet Take 2 tablets (650 mg total) by mouth every 6 (six) hours as needed for pain. (Patient taking differently: Take 650 mg by mouth every 6 (six) hours as needed for pain. )    . albuterol (PROVENTIL HFA;VENTOLIN HFA) 108 (90 Base) MCG/ACT inhaler Inhale 2 puffs into the lungs every 6 (six) hours as needed for wheezing or shortness of breath. 1 Inhaler 2    . ALPRAZolam (XANAX) 0.25 MG tablet TAKE ONE TABLET BY MOUTH TWICE A DAY AS NEEDED FOR ANXIETY (Patient taking differently: Take 0.25 mg by mouth 2 (two) times daily as needed for anxiety. ) 60 tablet 3  . brimonidine (ALPHAGAN) 0.2 % ophthalmic solution Place 1 drop into both eyes 3 (three) times daily.     . Calcium Carbonate-Vitamin D (CALCIUM-CARB 600 + D) 600-125 MG-UNIT TABS Take 2 tablets by mouth daily.    . carvedilol (COREG) 6.25 MG tablet 1.5 tablets twice daily with meals 270 tablet 0  . cholecalciferol (VITAMIN D) 1000 units tablet Take 1,000 Units by mouth daily.    . dorzolamide (TRUSOPT) 2 % ophthalmic solution Place 1 drop into both eyes 3 (three) times daily.     . ferrous sulfate 325 (65  FE) MG tablet Take 325 mg by mouth daily with breakfast.    . furosemide (LASIX) 20 MG tablet TAKE ONE-HALF TABLET BY MOUTH DAILY (Patient taking differently: Take 10 mg by mouth daily. ) 45 tablet 3  . gabapentin (NEURONTIN) 300 MG capsule TAKE ONE CAPSULE BY MOUTH THREE TIMES A DAY AS NEEDED FOR SHINGLES PAIN 90 capsule 0  . [START ON 07/23/2019] HYDROcodone-acetaminophen (NORCO) 10-325 MG tablet Take 1 tablet by mouth every 6 (six) hours as needed. 30 tablet 0  . hydroxychloroquine (PLAQUENIL) 200 MG tablet Take 400 mg by mouth daily.     Marland Kitchen latanoprost (XALATAN) 0.005 % ophthalmic solution Place 1 drop into both eyes at bedtime.     Marland Kitchen levothyroxine (SYNTHROID) 112 MCG tablet Take 1 tablet (112 mcg total) by mouth every morning. 90 tablet 0  . losartan (COZAAR) 100 MG tablet Take 1 tablet (100 mg total) by mouth at bedtime. 90 tablet 0  . mirtazapine (REMERON) 15 MG tablet Take 1 tablet (15 mg total) by mouth at bedtime. 30 tablet 2  . MYRBETRIQ 50 MG TB24 tablet TAKE ONE TABLET BY MOUTH DAILY (Patient taking differently: Take 50 mg by mouth daily. ) 30 tablet 4  . nitroGLYCERIN (NITROLINGUAL) 0.4 MG/SPRAY spray Place 1 spray under the tongue every 5 (five) minutes x 3 doses as needed for chest  pain. 4.9 g 3  . Omega-3 Fatty Acids (FISH OIL) 1000 MG CAPS Take 1,000 mg by mouth daily.     . ondansetron (ZOFRAN ODT) 4 MG disintegrating tablet Take 1 tablet (4 mg total) by mouth every 8 (eight) hours as needed for nausea or vomiting. 20 tablet 0  . oxybutynin (DITROPAN-XL) 10 MG 24 hr tablet Take 1 tablet (10 mg total) by mouth at bedtime. 30 tablet 0  . pantoprazole (PROTONIX) 40 MG tablet TAKE ONE TABLET BY MOUTH DAILY (Patient taking differently: Take 40 mg by mouth daily. ) 90 tablet 1  . PARoxetine (PAXIL) 10 MG tablet TAKE ONE TABLET BY MOUTH DAILY AFTER DINNER (Patient taking differently: Take 10 mg by mouth daily after supper. ) 90 tablet 1  . predniSONE (DELTASONE) 10 MG tablet 6 tablets daily for 3 days,  then reduce by 1 tablet daily until gone 33 tablet 0  . vitamin C (ASCORBIC ACID) 500 MG tablet Take 500 mg by mouth daily.     No current facility-administered medications for this visit.    OBJECTIVE:  Vitals:   07/21/19 0853  BP: (!) 148/68  Pulse: 61  Resp: 17  Temp: (!) 95.7 F (35.4 C)  SpO2: 97%     Body mass index is 26.39 kg/m.    ECOG FS:2 - Symptomatic, <50% confined to bed  General: Thin, ill-appearing, no acute distress.  Sitting in a wheelchair. Eyes: Pink conjunctiva, anicteric sclera. HEENT: Normocephalic, moist mucous membranes. Lungs: No audible wheezing or coughing. Heart: Regular rate and rhythm. Abdomen: Soft, nontender, no obvious distention. Musculoskeletal: No edema, cyanosis, or clubbing. Neuro: Alert, answering all questions appropriately. Cranial nerves grossly intact. Skin: No rashes or petechiae noted. Psych: Normal affect.   LAB RESULTS:  Infusion on 07/21/2019  Component Date Value Ref Range Status  . Order Confirmation 07/21/2019    Final                   Value:ORDER PROCESSED BY BLOOD BANK Performed at [facility], [address] [place], [place] [address]   . ABO/RH(D) 07/21/2019 O POS   Final  .  Antibody  Screen 07/21/2019 NEG   Final  . Sample Expiration 07/21/2019 07/24/2019,2359   Final  . Unit Number 07/21/2019 [id number]   Final  . Blood Component Type 07/21/2019 RBC, LR IRR   Final  . Unit division 07/21/2019 00   Final  . Status of Unit 07/21/2019 ISSUED   Final  . Transfusion Status 07/21/2019 OK TO TRANSFUSE   Final  . Crossmatch Result 07/21/2019    Final                   Value:Compatible Performed at [facility], [address] [place], [place] [address]   . ISSUE DATE / TIME 07/21/2019 [id number]   Final  . Blood Product Unit Number 07/21/2019 [id number]   Final  . PRODUCT CODE 07/21/2019 [id number]   Final  . Unit Type and Rh 07/21/2019 5100   Final  . Blood Product Expiration Date 07/21/2019 [id number]   Final  Clinical Support on 07/21/2019  Component Date Value Ref Range Status  . WBC 07/21/2019 6.6  4.0 - 10.5 K/uL Final  . RBC 07/21/2019 2.75* 3.87 - 5.11 MIL/uL Final  . Hemoglobin 07/21/2019 7.9* 12.0 - 15.0 g/dL Final  . HCT 07/21/2019 25.3* 36.0 - 46.0 % Final  . MCV 07/21/2019 92.0  80.0 - 100.0 fL Final  . MCH 07/21/2019 28.7  26.0 - 34.0 pg Final  . MCHC 07/21/2019 31.2  30.0 - 36.0 g/dL Final  . RDW 07/21/2019 16.9* 11.5 - 15.5 % Final  . Platelets 07/21/2019 51* 150 - 400 K/uL Final   Comment: Immature Platelet Fraction may be clinically indicated, consider ordering this additional test [id number]   . nRBC 07/21/2019 3.2* 0.0 - 0.2 % Final  . Neutrophils Relative % 07/21/2019 35  % Final  . Neutro Abs 07/21/2019 3.0  1.7 - 7.7 K/uL Final  . Band Neutrophils 07/21/2019 10  % Final  . Lymphocytes Relative 07/21/2019 18  % Final  . Lymphs Abs 07/21/2019 1.2  0.7 - 4.0 K/uL Final  . Monocytes Relative 07/21/2019 4  % Final  . Monocytes Absolute 07/21/2019 0.3  0.1 - 1.0 K/uL Final  . Eosinophils Relative 07/21/2019 0  % Final  . Eosinophils Absolute 07/21/2019 0.0  0.0 - 0.5 K/uL Final  . Basophils Relative 07/21/2019 0  % Final  .  Basophils Absolute 07/21/2019 0.0  0.0 - 0.1 K/uL Final  . WBC Morphology 07/21/2019 DIFF CONFIRMED BY MANUAL   Final  . RBC Morphology 07/21/2019 MIXED RBC POPULATION WITH FEW POLYCHROMATOPHILIC RBCS, OCC BASOPHILIC STIPPLING AND OCC NRBCS NOTED   Final  . Smear Review 07/21/2019 PLATELETS APPEAR DECREASED   Final   PLATELETS VARY IN SIZE WITH OCC GIANT PLATELETS NOTED  . Metamyelocytes Relative 07/21/2019 9  % Final  . Myelocytes 07/21/2019 21  % Final  . Promyelocytes Relative 07/21/2019 1  % Final  . Blasts 07/21/2019 2  % Final  . Abs Immature Granulocytes 07/21/2019 2.00* 0.00 - 0.07 K/uL Final   Performed at [facility], [address], [place], [place] [address]  . Blood Bank Specimen 07/21/2019 SAMPLE AVAILABLE FOR TESTING   Final  . Sample Expiration 07/21/2019    Final                   Value:07/24/2019,2359 Performed at [facility], [address] [place], [place] [address]        STUDIES: CT BONE MARROW BIOPSY & ASPIRATION  Result Date: 07/05/2019 INDICATION: Anemia.  EXAM: CT-DIRECTED BONE MARROW BIOPSY AND ASPIRATION. MEDICATIONS: None. ANESTHESIA/SEDATION: Moderate (conscious) sedation was employed during this procedure. A total of Versed 4.0 mg Moderate Sedation Time: 35 minutes. The patient's level of consciousness and vital signs were monitored continuously by radiology nursing throughout the procedure under my direct supervision. FLUOROSCOPY TIME:  Fluoroscopy Time: 0 minutes 0 seconds COMPLICATIONS: None immediate. PROCEDURE: After discussing risks and benefits of this procedure with the patient informed consent was obtained. Back was sterilely prepped and draped. Following local anesthesia with 1% lidocaine and IV conscious sedation with 4.0 mg of Versed. Standard bone marrow biopsy and aspiration was performed. Two core samples obtained. Aspirate and core samples sent to lab for further evaluation. IMPRESSION: Successful CT-directed bone marrow  biopsy and aspiration. Electronically Signed   By: Marcello Moores  Register   On: 07/05/2019 10:13   MM 3D SCREEN BREAST BILATERAL  Result Date: 06/25/2019 CLINICAL DATA:  Screening. EXAM: DIGITAL SCREENING BILATERAL MAMMOGRAM WITH TOMO AND CAD COMPARISON:  Previous exam(s). ACR Breast Density Category c: The breast tissue is heterogeneously dense, which may obscure small masses. FINDINGS: There are no findings suspicious for malignancy. Images were processed with CAD. IMPRESSION: No mammographic evidence of malignancy. A result letter of this screening mammogram will be mailed directly to the patient. RECOMMENDATION: Screening mammogram in one year. (Code:SM-B-01Y) BI-RADS CATEGORY  1: Negative. Electronically Signed   By: Lovey Newcomer M.D.   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 Plan:  1.  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  The complexity of patient's karyotype and the loss of 17p are indicative of a high-grade MDS with a poor prognosis.  We discussed the possibility of chemotherapy, possibly with Vidaza.  Given patient's poor performance status, this is not recommended at this time.  Patient's hemoglobin remained significantly decreased at 6.7, therefore will proceed with 2 units of packed red blood cells today.  Patient will require Desferal with all blood transfusions moving forward.  Return to clinic in 1 week for further evaluation and consideration of transfusion.   2. 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  Patient completed cycle 3 of carboplatinum and etoposide on September 19, 2017.  She also completed XRT.  Given her persistent pancytopenia, treatment was discontinued.  Patient's most recent CT scan on January 13, 2019 was reviewed independently with no obvious  evidence of recurrent or progressive disease.  Proceed with repeat CT scan tomorrow. 3.  Pancytopenia: Secondary to underlying MDS.  Monitor. 3. Renal cell carcinoma of the right kidney: Stage T1a N0 M0.  Patient underwent partial nephrectomy on October 23, 2015.  Repeat imaging as above.  Patient reports she has been discharged from urology clinic. 4.  Rheumatoid arthritis: Continue Plaquenil. Continue follow-up per rheumatology.   I spent a total of 25 minutes face-to-face with the patient and reviewing chart data of which greater than 50% of the visit was spent in counseling and coordination of care as detailed above.   Patient expressed understanding and was in agreement with this plan. She also understands that She can call clinic at any time with any questions, concerns, or complaints.    Lloyd Huger, MD   07/21/2019 11:14 AM

## 2019-07-19 ENCOUNTER — Telehealth: Payer: Self-pay | Admitting: Internal Medicine

## 2019-07-19 MED ORDER — HYDROCODONE-ACETAMINOPHEN 10-325 MG PO TABS: 1.0000 | ORAL_TABLET | Freq: Four times a day (QID) | ORAL | 0 refills | Status: AC | PRN

## 2019-07-19 NOTE — Telephone Encounter (Signed)
Pt told to call back about the pain med that Dr Derrel Nip put her on for the pain in her legs, pt states that the pain is much better but her feet are a little swollen, pain in leg better but still can not put all her weight on the right leg, but much better. for FU at [phone]

## 2019-07-19 NOTE — Addendum Note (Signed)
Addended by: [name] on: 07/19/2019 05:14 PM   Modules accepted: Orders

## 2019-07-19 NOTE — Telephone Encounter (Signed)
She can elevate her legs for the swelling,,,  The prednisone is probably aggravating the swelling.. I have refilled the hyrdorocone for Dec 18

## 2019-07-20 ENCOUNTER — Other Ambulatory Visit: Payer: Self-pay

## 2019-07-20 NOTE — Progress Notes (Signed)
Patient pre screened for office appointment, no questions or concerns today. Patient reminded of upcoming appointment time and date. 

## 2019-07-20 NOTE — Telephone Encounter (Signed)
Spoke with pt to let her know that Dr. Derrel Nip would like for her to elevate her legs to help with the swelling and that she refilled her Hydrocodone to take for the pain as needed. The pt stated that she does not think that she will need it because she is doing a lot better and isn't taking the pain medication very 6 hours as prescribed.

## 2019-07-21 ENCOUNTER — Inpatient Hospital Stay: Payer: Medicare Other | Admitting: *Deleted

## 2019-07-21 ENCOUNTER — Inpatient Hospital Stay: Payer: Medicare Other

## 2019-07-21 ENCOUNTER — Other Ambulatory Visit: Payer: Self-pay

## 2019-07-21 ENCOUNTER — Ambulatory Visit
Admission: RE | Admit: 2019-07-21 | Discharge: 2019-07-21 | Disposition: A | Discharge status: Home / Self Care | Payer: Medicare Other | Source: Ambulatory Visit | Attending: Radiation Oncology | Admitting: Radiation Oncology

## 2019-07-21 ENCOUNTER — Inpatient Hospital Stay (HOSPITAL_BASED_OUTPATIENT_CLINIC_OR_DEPARTMENT_OTHER): Payer: Medicare Other | Admitting: Oncology

## 2019-07-21 VITALS — BP 148/68 | HR 61 | Temp 95.7°F | Resp 17 | Wt 144.3 lb

## 2019-07-21 DIAGNOSIS — Z95828 Presence of other vascular implants and grafts: Secondary | ICD-10-CM

## 2019-07-21 DIAGNOSIS — R5383 Other fatigue: Secondary | ICD-10-CM | POA: Diagnosis not present

## 2019-07-21 DIAGNOSIS — D469 Myelodysplastic syndrome, unspecified: Secondary | ICD-10-CM

## 2019-07-21 DIAGNOSIS — R531 Weakness: Secondary | ICD-10-CM | POA: Diagnosis not present

## 2019-07-21 DIAGNOSIS — I70219 Atherosclerosis of native arteries of extremities with intermittent claudication, unspecified extremity: Secondary | ICD-10-CM

## 2019-07-21 DIAGNOSIS — Z923 Personal history of irradiation: Secondary | ICD-10-CM | POA: Insufficient documentation

## 2019-07-21 DIAGNOSIS — C7A8 Other malignant neuroendocrine tumors: Secondary | ICD-10-CM | POA: Insufficient documentation

## 2019-07-21 DIAGNOSIS — D500 Iron deficiency anemia secondary to blood loss (chronic): Secondary | ICD-10-CM

## 2019-07-21 LAB — CBC WITH DIFFERENTIAL/PLATELET
Abs Immature Granulocytes: 2 10*3/uL — ABNORMAL HIGH (ref 0.00–0.07)
Band Neutrophils: 10 %
Basophils Absolute: 0 10*3/uL (ref 0.0–0.1)
Basophils Relative: 0 %
Blasts: 2 %
Eosinophils Absolute: 0 10*3/uL (ref 0.0–0.5)
Eosinophils Relative: 0 %
HCT: 25.3 % — ABNORMAL LOW (ref 36.0–46.0)
Hemoglobin: 7.9 g/dL — ABNORMAL LOW (ref 12.0–15.0)
Lymphocytes Relative: 18 %
Lymphs Abs: 1.2 10*3/uL (ref 0.7–4.0)
MCH: 28.7 pg (ref 26.0–34.0)
MCHC: 31.2 g/dL (ref 30.0–36.0)
MCV: 92 fL (ref 80.0–100.0)
Metamyelocytes Relative: 9 %
Monocytes Absolute: 0.3 10*3/uL (ref 0.1–1.0)
Monocytes Relative: 4 %
Myelocytes: 21 %
Neutro Abs: 3 10*3/uL (ref 1.7–7.7)
Neutrophils Relative %: 35 %
Platelets: 51 10*3/uL — ABNORMAL LOW (ref 150–400)
Promyelocytes Relative: 1 %
RBC: 2.75 MIL/uL — ABNORMAL LOW (ref 3.87–5.11)
RDW: 16.9 % — ABNORMAL HIGH (ref 11.5–15.5)
Smear Review: DECREASED
WBC: 6.6 10*3/uL (ref 4.0–10.5)
nRBC: 3.2 % — ABNORMAL HIGH (ref 0.0–0.2)

## 2019-07-21 LAB — SAMPLE TO BLOOD BANK

## 2019-07-21 LAB — PREPARE RBC (CROSSMATCH)

## 2019-07-21 MED ORDER — SODIUM CHLORIDE 0.9% FLUSH
10.0000 mL | Freq: Once | INTRAVENOUS | Status: AC
  Administered 2019-07-21: 10 mL via INTRAVENOUS
  Filled 2019-07-21: qty 10

## 2019-07-21 MED ORDER — HEPARIN SOD (PORK) LOCK FLUSH 100 UNIT/ML IV SOLN
500.0000 [IU] | Freq: Every day | INTRAVENOUS | Status: AC | PRN
  Administered 2019-07-21: 500 [IU]
  Filled 2019-07-21: qty 5

## 2019-07-21 MED ORDER — DIPHENHYDRAMINE HCL 50 MG/ML IJ SOLN
25.0000 mg | Freq: Once | INTRAMUSCULAR | Status: AC
  Administered 2019-07-21: 10:00:00 25 mg via INTRAVENOUS
  Filled 2019-07-21: qty 1

## 2019-07-21 MED ORDER — ACETAMINOPHEN 325 MG PO TABS
650.0000 mg | ORAL_TABLET | Freq: Once | ORAL | Status: AC
  Administered 2019-07-21: 650 mg via ORAL
  Filled 2019-07-21: qty 2

## 2019-07-21 MED ORDER — SODIUM CHLORIDE 0.9% IV SOLUTION
250.0000 mL | Freq: Once | INTRAVENOUS | Status: AC
  Administered 2019-07-21: 250 mL via INTRAVENOUS
  Filled 2019-07-21: qty 250

## 2019-07-21 NOTE — Progress Notes (Signed)
Pt here for follow up and blood transfusion. Pt reports feeling weak and nauseated this morning.

## 2019-07-21 NOTE — Progress Notes (Signed)
Radiation Oncology Follow up Note  Name: Caroline Nichols   Date:   07/21/2019 MRN:  [id number] DOB: 08/10/1940    This 78 y.o. female presents to the clinic today for 2-year follow-up status post hypofractionated  radiation therapy  for stage IIb large left lung neuroendocrine carcinoma.Marland Kitchen  REFERRING PROVIDER: [name], MD  HPI: Patient is a 78 year old female.  Now about 2 years having completed a hypofractionated course of radiation therapy for a large neuroendocrine carcinoma of the left lung stage IIb.  She had CT scans back in June which I have reviewed shows no evidence of lung cancer recurrence in the third thorax and some left infrahilar post radiation changes.  She is scheduled for repeat CT scan tomorrow.  Unfortunately she has been diagnosed with high risk MDS is seen today in the infusion suite she is receiving a transfusion today.  She is from a chest standpoint doing well specifically denies cough hemoptysis chest tightness or dysphagia.  She is quite weak and fatigued.  COMPLICATIONS OF TREATMENT: none  FOLLOW UP COMPLIANCE: keeps appointments   PHYSICAL EXAM:  There were no vitals taken for this visit. Frail-appearing female in NAD receiving infusion.  Well-developed well-nourished patient in NAD. HEENT reveals PERLA, EOMI, discs not visualized.  Oral cavity is clear. No oral mucosal lesions are identified. Neck is clear without evidence of cervical or supraclavicular adenopathy. Lungs are clear to A&P. Cardiac examination is essentially unremarkable with regular rate and rhythm without murmur rub or thrill. Abdomen is benign with no organomegaly or masses noted. Motor sensory and DTR levels are equal and symmetric in the upper and lower extremities. Cranial nerves II through XII are grossly intact. Proprioception is intact. No peripheral adenopathy or edema is identified. No motor or sensory levels are noted. Crude visual fields are within normal range.  RADIOLOGY RESULTS:  CT scans from June reviewed we will review her CT scans that will be performed tomorrow.  PLAN: At this time based on the patient's poor prognosis secondary to MDS I am going to turn follow-up care over to medical oncology.  I would be happy to reevaluate the patient anytime should further treatment be indicated.  Patient comprehends my recommendations well.  I would like to take this opportunity to thank you for allowing me to participate in the care of your patient.Noreene Filbert, MD

## 2019-07-21 NOTE — Progress Notes (Signed)
Patient received one unit of pRBC's today. Post blood transfusion BP was elevated at 178/72. Patient is asymptomatic. No complaints of SOB, back pain, chills, or headache. Temperature was normal. Rechecked BP manually, and it was 191/78, HR 85. Notified Lauren, NP and she came to assess the patient. Per Ander Purpura, NP and Dr. Grayland Ormond patient is ok to go home since she is asymptomatic. Informed patient to recheck her BP when she gets home and if she notices any symptoms of a transfusion reaction she should go to the emergency room. Patient verbalizes understanding.

## 2019-07-22 ENCOUNTER — Ambulatory Visit
Admission: RE | Admit: 2019-07-22 | Discharge: 2019-07-22 | Disposition: A | Discharge status: Home / Self Care | Payer: Medicare Other | Source: Ambulatory Visit | Attending: Oncology | Admitting: Oncology

## 2019-07-22 ENCOUNTER — Other Ambulatory Visit: Payer: Self-pay

## 2019-07-22 DIAGNOSIS — C7A8 Other malignant neuroendocrine tumors: Secondary | ICD-10-CM | POA: Diagnosis present

## 2019-07-22 DIAGNOSIS — C641 Malignant neoplasm of right kidney, except renal pelvis: Secondary | ICD-10-CM

## 2019-07-22 LAB — TYPE AND SCREEN
ABO/RH(D): O POS
Antibody Screen: NEGATIVE
Unit division: 0

## 2019-07-22 LAB — BPAM RBC
Blood Product Expiration Date: 202101022359
ISSUE DATE / TIME: 202012161110
Unit Type and Rh: 5100

## 2019-07-22 MED ORDER — IOHEXOL 300 MG/ML  SOLN
100.0000 mL | Freq: Once | INTRAMUSCULAR | Status: AC | PRN
  Administered 2019-07-22: 100 mL via INTRAVENOUS

## 2019-07-22 NOTE — Progress Notes (Signed)
[place] @ 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  HISTORY OF PRESENT ILLNESS: Patient returns to clinic today for repeat laboratory work and further evaluation.  She continues to have significant weakness and fatigue.  Patient states she has a poor appetite.  Every time she eats, she has significant abdominal pain and nausea and does not wish to eat any further. She does not complain of shortness of breath today.  She denies any recent fevers or illnesses. She has no neurologic complaints.  She denies any chest pain, cough, or hemoptysis.  She denies any constipation or diarrhea.  She has no urinary complaints.  Patient continues to feel generally terrible, but offers no further specific complaints.  REVIEW OF SYSTEMS:    Review of Systems  Constitutional: Positive for malaise/fatigue. Negative for fever and weight loss.  HENT: Negative.   Eyes: Negative.  Negative for blurred vision, double vision and pain.  Respiratory: Negative.  Negative for cough and shortness of breath.   Cardiovascular: Negative.  Negative for chest pain and leg swelling.  Gastrointestinal: Positive for abdominal pain and nausea. Negative for blood in stool, diarrhea and melena.  Genitourinary: Negative.  Negative for dysuria.  Musculoskeletal: Negative.  Negative for falls.  Skin: Negative.  Negative for rash.  Neurological: Positive for sensory change and weakness. Negative for focal weakness and headaches.    Psychiatric/Behavioral: Negative.  The patient is not nervous/anxious.   All other systems reviewed and are negative.   PAST MEDICAL HISTORY: Past Medical History:  Diagnosis Date  . Anemia 02/2013   F/U with Dr. Grayland Ormond for Feraheme injections  . Anxiety   . Arthritis    Possible RA - Follow up appt with Dr. Jefm Bryant  . Cellulitis of arm, right   . Chest pain 03/26/2013  . Diabetes insipidus ([place])    On desmopressin  . GERD (gastroesophageal reflux disease)   . H/O seasonal allergies   . H/O transfusion of packed red blood cells 02/2013   3 units PRBC for severe anemia 02/2013  . Herniated disc   . History of kidney stones   . Hyperlipidemia   . Hypertension   . Hyperthyroidism    s/p radiation therapy, now hypothyroidism  . IBS (irritable bowel syndrome)   . Lung cancer ([place]) 06/2017   left  . Migraine   . Pacemaker    a. s/p successful implantation of a Medtronic CapSureFix Novus MRIT SureScan serial # [id number] on 06/28/16 by Dr. Caryl Comes for sinus node dysfunction.  . Pancreatic cyst   . Paroxysmal A-fib ([place])   . Renal cell cancer, right ([place]) 10/23/2015   Right partial nephrectomy.    PAST SURGICAL HISTORY: Past Surgical History:  Procedure Laterality Date  . ABDOMINAL HYSTERECTOMY    . BREAST EXCISIONAL BIOPSY Right 2005   neg  . BREAST SURGERY  1990s   Biopsy  . CERVICAL SPINE SURGERY     Bone fusion  . COLONOSCOPY  2008?   [place]  . COLONOSCOPY WITH PROPOFOL N/A 10/15/2016   Procedure: COLONOSCOPY  WITH PROPOFOL;  Surgeon: Robert Bellow, MD;  Location: [facility] ENDOSCOPY;  Service: Endoscopy;  Laterality: N/A;  . EP IMPLANTABLE DEVICE N/A 06/28/2016   Procedure: Pacemaker Implant;  Surgeon: Deboraha Sprang, MD;  Location: [facility] LAB;  Service: Cardiovascular;  Laterality: N/A;  . HAMMER TOE SURGERY    . HERNIA REPAIR Left    Inguinal Hernia Repair  . PORTA CATH INSERTION N/A 07/24/2017   Procedure: PORTA CATH INSERTION;  Surgeon: Algernon Huxley, MD;  Location: [place] [facility] LAB;  Service: Cardiovascular;  Laterality: N/A;  . ROBOTIC ASSITED PARTIAL NEPHRECTOMY Right 10/23/2015   Procedure: ROBOTIC ASSITED PARTIAL NEPHRECTOMY with intraop ultrasound;  Surgeon: Hollice Espy, MD;  Location: [facility] ORS;  Service: Urology;  Laterality: Right;  . TUBAL LIGATION      FAMILY HISTORY Family History  Problem Relation Age of Onset  . Heart disease Mother   . Heart disease Father   . Diabetes Brother   . Kidney disease Brother   . Stroke Daughter        due to medication reaction  . Breast cancer Cousin   . Prostate cancer Neg Hx   . Hematuria Neg Hx     ADVANCED DIRECTIVES:  No flowsheet data found.  HEALTH MAINTENANCE: Social History   Tobacco Use  . Smoking status: Former Smoker    Packs/day: 0.50    Years: 30.00    Pack years: 15.00    Types: Cigarettes    Quit date: 12/03/2012    Years since quitting: 6.6  . Smokeless tobacco: Never Used  Substance Use Topics  . Alcohol use: No    Alcohol/week: 0.0 standard drinks  . Drug use: No     Allergies  Allergen Reactions  . Metrizamide     Other reaction(s): Other (See Comments)  . Adhesive [Tape] Other (See Comments)    "irritate skin"  . Augmentin [Amoxicillin-Pot Clavulanate] Diarrhea    Severe diarrhea  . Codeine Nausea Only  . Morphine And Related Other (See Comments)    Shut down her organs  . Other     Skin irritation  . Tetanus Toxoid Other (See Comments)    REACTION: ARM SWELLING,REDNESS  . Tramadol Nausea And Vomiting    Current Outpatient Medications  Medication Sig Dispense Refill  . acetaminophen (TYLENOL) 325 MG tablet Take 2 tablets (650 mg total) by mouth every 6 (six) hours as needed for pain. (Patient taking differently: Take 650 mg by mouth every 6 (six) hours as needed for pain. )    . albuterol (PROVENTIL HFA;VENTOLIN HFA) 108 (90 Base) MCG/ACT inhaler Inhale 2 puffs into the lungs every 6 (six) hours as needed for wheezing or  shortness of breath. 1 Inhaler 2  . ALPRAZolam (XANAX) 0.25 MG tablet TAKE ONE TABLET BY MOUTH TWICE A DAY AS NEEDED FOR ANXIETY 60 tablet 2  . brimonidine (ALPHAGAN) 0.2 % ophthalmic solution Place 1 drop into both eyes 3 (three) times daily.     . Calcium Carbonate-Vitamin D (CALCIUM-CARB 600 + D) 600-125 MG-UNIT TABS Take 2 tablets by mouth daily.    . carvedilol (COREG) 6.25 MG tablet 1.5 tablets twice daily with meals 270 tablet 0  . cholecalciferol (VITAMIN D) 1000 units tablet Take 1,000 Units by mouth daily.    . dorzolamide (TRUSOPT) 2 % ophthalmic solution Place 1 drop into both eyes 3 (three) times daily.     . ferrous sulfate 325 (65 FE) MG tablet Take 325 mg by mouth  daily with breakfast.    . furosemide (LASIX) 20 MG tablet TAKE ONE-HALF TABLET BY MOUTH DAILY (Patient taking differently: Take 10 mg by mouth daily. ) 45 tablet 3  . gabapentin (NEURONTIN) 300 MG capsule TAKE ONE CAPSULE BY MOUTH THREE TIMES A DAY AS NEEDED FOR SHINGLES 90 capsule 0  . HYDROcodone-acetaminophen (NORCO) 10-325 MG tablet Take 1 tablet by mouth every 6 (six) hours as needed. 30 tablet 0  . hydroxychloroquine (PLAQUENIL) 200 MG tablet Take 400 mg by mouth daily.     Marland Kitchen latanoprost (XALATAN) 0.005 % ophthalmic solution Place 1 drop into both eyes at bedtime.     Marland Kitchen levothyroxine (SYNTHROID) 112 MCG tablet Take 1 tablet (112 mcg total) by mouth every morning. 90 tablet 0  . losartan (COZAAR) 100 MG tablet Take 1 tablet (100 mg total) by mouth at bedtime. 90 tablet 0  . mirtazapine (REMERON) 15 MG tablet Take 1 tablet (15 mg total) by mouth at bedtime. 30 tablet 2  . MYRBETRIQ 50 MG TB24 tablet TAKE ONE TABLET BY MOUTH DAILY (Patient taking differently: Take 50 mg by mouth daily. ) 30 tablet 4  . nitroGLYCERIN (NITROLINGUAL) 0.4 MG/SPRAY spray Place 1 spray under the tongue every 5 (five) minutes x 3 doses as needed for chest pain. 4.9 g 3  . Omega-3 Fatty Acids (FISH OIL) 1000 MG CAPS Take 1,000 mg by mouth  daily.     . ondansetron (ZOFRAN ODT) 4 MG disintegrating tablet Take 1 tablet (4 mg total) by mouth every 8 (eight) hours as needed for nausea or vomiting. 20 tablet 0  . oxybutynin (DITROPAN-XL) 10 MG 24 hr tablet TAKE ONE TABLET BY MOUTH AT BEDTIME 30 tablet 0  . PARoxetine (PAXIL) 10 MG tablet TAKE ONE TABLET BY MOUTH DAILY AFTER DINNER (Patient taking differently: Take 10 mg by mouth daily after supper. ) 90 tablet 1  . predniSONE (DELTASONE) 10 MG tablet 6 tablets daily for 3 days,  then reduce by 1 tablet daily until gone 33 tablet 0  . vitamin C (ASCORBIC ACID) 500 MG tablet Take 500 mg by mouth daily.    . pantoprazole (PROTONIX) 40 MG tablet Take 1 tablet (40 mg total) by mouth 2 (two) times daily. 60 tablet 3   No current facility-administered medications for this visit.    OBJECTIVE:  Vitals:   07/28/19 0829  BP: (!) 153/72  Pulse: 83  Resp: 17  Temp: 98.5 F (36.9 C)  SpO2: 99%     Body mass index is 24.6 kg/m.    ECOG FS:2 - Symptomatic, <50% confined to bed  General: Ill-appearing, no acute distress.  Sitting in a wheelchair. Eyes: Pink conjunctiva, anicteric sclera. HEENT: Normocephalic, moist mucous membranes. Lungs: No audible wheezing or coughing. Heart: Regular rate and rhythm. Abdomen: Soft, nontender, no obvious distention. Musculoskeletal: No edema, cyanosis, or clubbing. Neuro: Alert, answering all questions appropriately. Cranial nerves grossly intact. Skin: No rashes or petechiae noted. Psych: Normal affect.   LAB RESULTS:  Infusion on 07/28/2019  Component Date Value Ref Range Status  . Blood Bank Specimen 07/28/2019 SAMPLE AVAILABLE FOR TESTING   Final  . Sample Expiration 07/28/2019    Final                   Value:07/31/2019,2359 Performed at [facility], [address] [place], [place] [address]   . WBC 07/28/2019 7.6  4.0 - 10.5 K/uL Final   WHITE COUNT CONFIRMED ON SMEAR  . RBC 07/28/2019  3.05* 3.87 - 5.11 MIL/uL Final  .  Hemoglobin 07/28/2019 8.8* 12.0 - 15.0 g/dL Final  . HCT 07/28/2019 28.2* 36.0 - 46.0 % Final  . MCV 07/28/2019 92.5  80.0 - 100.0 fL Final  . MCH 07/28/2019 28.9  26.0 - 34.0 pg Final  . MCHC 07/28/2019 31.2  30.0 - 36.0 g/dL Final  . RDW 07/28/2019 16.2* 11.5 - 15.5 % Final  . Platelets 07/28/2019 115* 150 - 400 K/uL Final  . nRBC 07/28/2019 1.5* 0.0 - 0.2 % Final  . Neutrophils Relative % 07/28/2019 35  % Final  . Neutro Abs 07/28/2019 2.8  1.7 - 7.7 K/uL Final  . Lymphocytes Relative 07/28/2019 20  % Final  . Lymphs Abs 07/28/2019 1.5  0.7 - 4.0 K/uL Final  . Monocytes Relative 07/28/2019 18  % Final  . Monocytes Absolute 07/28/2019 1.3* 0.1 - 1.0 K/uL Final  . Eosinophils Relative 07/28/2019 0  % Final  . Eosinophils Absolute 07/28/2019 0.0  0.0 - 0.5 K/uL Final  . Basophils Relative 07/28/2019 3  % Final  . Basophils Absolute 07/28/2019 0.2* 0.0 - 0.1 K/uL Final  . WBC Morphology 07/28/2019 MODERATE LEFT SHIFT (>5% METAS AND MYELOS,OCC PRO NOTED)   Final  . Smear Review 07/28/2019 PLATELETS APPEAR DECREASED   Final   PLALETLETS VARY IN SIZE AND GRANULATION  . Immature Granulocytes 07/28/2019 24  % Final  . Abs Immature Granulocytes 07/28/2019 1.78* 0.00 - 0.07 K/uL Final   Performed at [facility], [address], [place], [place] [address]       STUDIES: CT Chest W Contrast  Result Date: 07/22/2019 CLINICAL DATA:  History of lung cancer.  Restaging. EXAM: CT CHEST, ABDOMEN, AND PELVIS WITH CONTRAST TECHNIQUE: Multidetector CT imaging of the chest, abdomen and pelvis was performed following the standard protocol during bolus administration of intravenous contrast. CONTRAST:  163m OMNIPAQUE IOHEXOL 300 MG/ML  SOLN COMPARISON:  01/13/2019 FINDINGS: CT CHEST FINDINGS Cardiovascular: Cardiac enlargement. The small to moderate posterior pericardial effusion identified, image 111/6. Aortic atherosclerosis. Lad and RCA coronary artery calcifications. Mediastinum/Nodes:  Thyroid gland not visualized and may be surgically absent The trachea appears patent and is midline. Normal appearance of the esophagus. No mediastinal, hilar, supraclavicular or axillary adenopathy. Lungs/Pleura: Paraseptal and centrilobular emphysema. No pleural effusion. Bandlike area of fibrosis and masslike architectural distortion within the perihilar left lung is again identified compatible with changes due to external beam radiation. The appearance is unchanged compared with the previous exam. No suspicious pulmonary nodule or mass identified at this time. Musculoskeletal: Healing left lateral rib deformities. Multi level degenerative disc disease identified throughout the thoracic spine. CT ABDOMEN PELVIS FINDINGS Hepatobiliary: No focal liver abnormality. Gallbladder is unremarkable. No biliary ductal dilatation. Pancreas: Similar appearance of mild increase caliber of the main duct. No inflammation. No mass Spleen: The spleen measures 9.7 x 6.3 by 15.6 cm (volume = 500 cm^3). New linear, bandlike area of hypoenhancement within the superior aspect of the spleen is identified, image 66/4 and is concerning for a area of splenic laceration. Adrenals/Urinary Tract: Normal appearance of the adrenal glands. Right kidney cyst measures 1.7 cm. No mass or hydronephrosis. The urinary bladder is unremarkable. Stomach/Bowel: Stomach is within normal limits. Appendix appears normal. No evidence of bowel wall thickening, distention, or inflammatory changes. Vascular/Lymphatic: Aortic atherosclerosis. No aneurysm. No abdominopelvic adenopathy identified. Reproductive: Status post hysterectomy. No adnexal masses. Other: There is a small to moderate volume of free fluid within the abdomen and pelvis. No discrete  fluid collections. Musculoskeletal: Scoliosis and degenerative disc disease. No aggressive lytic or sclerotic bone lesions identified the at this time. IMPRESSION: 1. Stable changes of external beam radiation  within the left lung without findings to suggest Lung cancer recurrence within the chest. 2. Significant increase in volume of the spleen. New bandlike area of low attenuation within the superior pole of spleen concerning for with age indeterminate splenic laceration. Alternatively, findings may reflect splenic infarct. 3. New small to moderate volume of free fluid within the abdomen and pelvis. Etiology is indeterminate but in the setting of splenic laceration could represent resolving hemoperitoneum. 4. Small pericardial effusion, new from previous study. 5. Healing left rib fractures. 6. Aortic Atherosclerosis (ICD10-I70.0) and Emphysema (ICD10-J43.9). Electronically Signed   By: Kerby Moors M.D.   On: 07/22/2019 10:40   CT Abdomen Pelvis W Contrast  Result Date: 07/22/2019 CLINICAL DATA:  History of lung cancer.  Restaging. EXAM: CT CHEST, ABDOMEN, AND PELVIS WITH CONTRAST TECHNIQUE: Multidetector CT imaging of the chest, abdomen and pelvis was performed following the standard protocol during bolus administration of intravenous contrast. CONTRAST:  144m OMNIPAQUE IOHEXOL 300 MG/ML  SOLN COMPARISON:  01/13/2019 FINDINGS: CT CHEST FINDINGS Cardiovascular: Cardiac enlargement. The small to moderate posterior pericardial effusion identified, image 111/6. Aortic atherosclerosis. Lad and RCA coronary artery calcifications. Mediastinum/Nodes: Thyroid gland not visualized and may be surgically absent The trachea appears patent and is midline. Normal appearance of the esophagus. No mediastinal, hilar, supraclavicular or axillary adenopathy. Lungs/Pleura: Paraseptal and centrilobular emphysema. No pleural effusion. Bandlike area of fibrosis and masslike architectural distortion within the perihilar left lung is again identified compatible with changes due to external beam radiation. The appearance is unchanged compared with the previous exam. No suspicious pulmonary nodule or mass identified at this time.  Musculoskeletal: Healing left lateral rib deformities. Multi level degenerative disc disease identified throughout the thoracic spine. CT ABDOMEN PELVIS FINDINGS Hepatobiliary: No focal liver abnormality. Gallbladder is unremarkable. No biliary ductal dilatation. Pancreas: Similar appearance of mild increase caliber of the main duct. No inflammation. No mass Spleen: The spleen measures 9.7 x 6.3 by 15.6 cm (volume = 500 cm^3). New linear, bandlike area of hypoenhancement within the superior aspect of the spleen is identified, image 66/4 and is concerning for a area of splenic laceration. Adrenals/Urinary Tract: Normal appearance of the adrenal glands. Right kidney cyst measures 1.7 cm. No mass or hydronephrosis. The urinary bladder is unremarkable. Stomach/Bowel: Stomach is within normal limits. Appendix appears normal. No evidence of bowel wall thickening, distention, or inflammatory changes. Vascular/Lymphatic: Aortic atherosclerosis. No aneurysm. No abdominopelvic adenopathy identified. Reproductive: Status post hysterectomy. No adnexal masses. Other: There is a small to moderate volume of free fluid within the abdomen and pelvis. No discrete fluid collections. Musculoskeletal: Scoliosis and degenerative disc disease. No aggressive lytic or sclerotic bone lesions identified the at this time. IMPRESSION: 1. Stable changes of external beam radiation within the left lung without findings to suggest Lung cancer recurrence within the chest. 2. Significant increase in volume of the spleen. New bandlike area of low attenuation within the superior pole of spleen concerning for with age indeterminate splenic laceration. Alternatively, findings may reflect splenic infarct. 3. New small to moderate volume of free fluid within the abdomen and pelvis. Etiology is indeterminate but in the setting of splenic laceration could represent resolving hemoperitoneum. 4. Small pericardial effusion, new from previous study. 5. Healing  left rib fractures. 6. Aortic Atherosclerosis (ICD10-I70.0) and Emphysema (ICD10-J43.9). Electronically Signed   By: TLovena Le  Clovis Riley M.D.   On: 07/22/2019 10:40   CT BONE MARROW BIOPSY & ASPIRATION  Result Date: 07/05/2019 INDICATION: Anemia. EXAM: CT-DIRECTED BONE MARROW BIOPSY AND ASPIRATION. MEDICATIONS: None. ANESTHESIA/SEDATION: Moderate (conscious) sedation was employed during this procedure. A total of Versed 4.0 mg Moderate Sedation Time: 35 minutes. The patient's level of consciousness and vital signs were monitored continuously by radiology nursing throughout the procedure under my direct supervision. FLUOROSCOPY TIME:  Fluoroscopy Time: 0 minutes 0 seconds COMPLICATIONS: None immediate. PROCEDURE: After discussing risks and benefits of this procedure with the patient informed consent was obtained. Back was sterilely prepped and draped. Following local anesthesia with 1% lidocaine and IV conscious sedation with 4.0 mg of Versed. Standard bone marrow biopsy and aspiration was performed. Two core samples obtained. Aspirate and core samples sent to lab for further evaluation. IMPRESSION: Successful CT-directed bone marrow biopsy and aspiration. 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  Plan:  1.  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  The complexity of patient's karyotype and the loss of 17p are indicative of a high-grade MDS with a poor prognosis.  We discussed the possibility of chemotherapy, possibly with Vidaza.  Given patient's poor performance status, this is not recommended at this time.  Patient's hemoglobin has significantly improved and is now 8.8.  She does not require transfusion today.  Return to clinic in 1 week with repeat laboratory work and further  evaluation.   2. 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  Patient completed cycle 3 of carboplatinum and etoposide on September 19, 2017.  She also completed XRT.  Given her persistent pancytopenia, treatment was discontinued.  Patient's most recent CT scan on July 22, 2019 reviewed independently and reported as above with no obvious evidence of recurrent or progressive disease.  Repeat imaging in June 2021.  3.  Pancytopenia: Secondary to underlying MDS.  Monitor. 4. Renal cell carcinoma of the right kidney: Stage T1a N0 M0.  Patient underwent partial nephrectomy on October 23, 2015.  Repeat CT scan as above.  Patient is no longer followed by urology. 5.  Rheumatoid arthritis: Continue Plaquenil. Continue follow-up per rheumatology.  6.  Abdominal pain: CT scan as above suggest an age indeterminate splenic laceration or possible splenic infarct.  This may be the etiology of her pain.  Although given that her symptoms worsen with p.o. intake, have instructed patient to increase her pantoprazole to twice a day and a referral was given to both GI and dietary. 7.  Indeterminate splenic laceration/splenic infarct: Consider repeat imaging in 6 to 8 weeks to ensure resolution.   Patient expressed understanding and was in agreement with this plan. She also understands that She can call clinic at any time with any questions, concerns, or complaints.    Lloyd Huger, MD   07/28/2019 11:18 AM

## 2019-07-23 NOTE — Progress Notes (Signed)
  I have reviewed the above information and agree with above.   Emet Rafanan, MD 

## 2019-07-26 ENCOUNTER — Ambulatory Visit: Payer: Medicare Other | Admitting: Radiation Oncology

## 2019-07-27 ENCOUNTER — Other Ambulatory Visit: Payer: Medicare Other

## 2019-07-27 ENCOUNTER — Other Ambulatory Visit: Payer: Self-pay

## 2019-07-27 ENCOUNTER — Other Ambulatory Visit: Payer: Self-pay | Admitting: Internal Medicine

## 2019-07-27 ENCOUNTER — Other Ambulatory Visit: Payer: Self-pay | Admitting: Emergency Medicine

## 2019-07-27 ENCOUNTER — Ambulatory Visit: Payer: Medicare Other | Admitting: Oncology

## 2019-07-27 DIAGNOSIS — D469 Myelodysplastic syndrome, unspecified: Secondary | ICD-10-CM

## 2019-07-27 NOTE — Progress Notes (Signed)
Patient pre screened for office appointment, no questions or concerns today. Patient reminded of upcoming appointment time and date. 

## 2019-07-27 NOTE — Telephone Encounter (Signed)
Refilled: 12/30/2018 Last OV: 07/16/2019 Next OV: 11/04/2019

## 2019-07-28 ENCOUNTER — Other Ambulatory Visit: Payer: Self-pay

## 2019-07-28 ENCOUNTER — Inpatient Hospital Stay: Payer: Medicare Other

## 2019-07-28 ENCOUNTER — Inpatient Hospital Stay (HOSPITAL_BASED_OUTPATIENT_CLINIC_OR_DEPARTMENT_OTHER): Payer: Medicare Other | Admitting: Oncology

## 2019-07-28 VITALS — BP 153/72 | HR 83 | Temp 98.5°F | Resp 17 | Wt 134.5 lb

## 2019-07-28 DIAGNOSIS — R140 Abdominal distension (gaseous): Secondary | ICD-10-CM

## 2019-07-28 DIAGNOSIS — C7A8 Other malignant neuroendocrine tumors: Secondary | ICD-10-CM

## 2019-07-28 DIAGNOSIS — D469 Myelodysplastic syndrome, unspecified: Secondary | ICD-10-CM | POA: Diagnosis not present

## 2019-07-28 DIAGNOSIS — I70219 Atherosclerosis of native arteries of extremities with intermittent claudication, unspecified extremity: Secondary | ICD-10-CM | POA: Diagnosis not present

## 2019-07-28 LAB — CBC WITH DIFFERENTIAL/PLATELET
Abs Immature Granulocytes: 1.78 10*3/uL — ABNORMAL HIGH (ref 0.00–0.07)
Basophils Absolute: 0.2 10*3/uL — ABNORMAL HIGH (ref 0.0–0.1)
Basophils Relative: 3 %
Eosinophils Absolute: 0 10*3/uL (ref 0.0–0.5)
Eosinophils Relative: 0 %
HCT: 28.2 % — ABNORMAL LOW (ref 36.0–46.0)
Hemoglobin: 8.8 g/dL — ABNORMAL LOW (ref 12.0–15.0)
Immature Granulocytes: 24 %
Lymphocytes Relative: 20 %
Lymphs Abs: 1.5 10*3/uL (ref 0.7–4.0)
MCH: 28.9 pg (ref 26.0–34.0)
MCHC: 31.2 g/dL (ref 30.0–36.0)
MCV: 92.5 fL (ref 80.0–100.0)
Monocytes Absolute: 1.3 10*3/uL — ABNORMAL HIGH (ref 0.1–1.0)
Monocytes Relative: 18 %
Neutro Abs: 2.8 10*3/uL (ref 1.7–7.7)
Neutrophils Relative %: 35 %
Platelets: 115 10*3/uL — ABNORMAL LOW (ref 150–400)
RBC: 3.05 MIL/uL — ABNORMAL LOW (ref 3.87–5.11)
RDW: 16.2 % — ABNORMAL HIGH (ref 11.5–15.5)
Smear Review: DECREASED
WBC: 7.6 10*3/uL (ref 4.0–10.5)
nRBC: 1.5 % — ABNORMAL HIGH (ref 0.0–0.2)

## 2019-07-28 LAB — SAMPLE TO BLOOD BANK

## 2019-07-28 MED ORDER — PANTOPRAZOLE SODIUM 40 MG PO TBEC: 40.0000 mg | DELAYED_RELEASE_TABLET | Freq: Two times a day (BID) | ORAL | 3 refills | Status: AC

## 2019-07-28 MED ORDER — SODIUM CHLORIDE 0.9% FLUSH
10.0000 mL | Freq: Once | INTRAVENOUS | Status: AC
  Administered 2019-07-28: 08:00:00 10 mL via INTRAVENOUS
  Filled 2019-07-28: qty 10

## 2019-07-28 MED ORDER — HEPARIN SOD (PORK) LOCK FLUSH 100 UNIT/ML IV SOLN
INTRAVENOUS | Status: AC
  Filled 2019-07-28: qty 5

## 2019-07-28 MED ORDER — HEPARIN SOD (PORK) LOCK FLUSH 100 UNIT/ML IV SOLN
500.0000 [IU] | Freq: Once | INTRAVENOUS | Status: AC
  Administered 2019-07-28: 500 [IU] via INTRAVENOUS
  Filled 2019-07-28: qty 5

## 2019-07-28 NOTE — Progress Notes (Signed)
Per Judson Roch RN per Dr. Grayland Ormond no blood transfusion needed at this time. Pt deaccessed and stable at discharge.

## 2019-07-28 NOTE — Addendum Note (Signed)
Addended by: Mila Merry on: 07/28/2019 11:49 AM   Modules accepted: Orders

## 2019-07-29 ENCOUNTER — Telehealth: Payer: Self-pay | Admitting: *Deleted

## 2019-07-29 NOTE — Telephone Encounter (Signed)
Please confirm if thyroid panel is the only lab this patient needs. She was placed on the lab schedule next week for fasting labs. If additional labs needed, please place future order.  Thanks

## 2019-07-29 NOTE — Telephone Encounter (Signed)
thanks

## 2019-07-30 ENCOUNTER — Emergency Department
Admission: EM | Admit: 2019-07-30 | Discharge: 2019-07-30 | Disposition: A | Discharge status: Home / Self Care | Payer: Medicare Other | Attending: Emergency Medicine | Admitting: Emergency Medicine

## 2019-07-30 ENCOUNTER — Other Ambulatory Visit: Payer: Self-pay

## 2019-07-30 ENCOUNTER — Encounter: Payer: Self-pay | Admitting: Emergency Medicine

## 2019-07-30 ENCOUNTER — Emergency Department: Payer: Medicare Other

## 2019-07-30 DIAGNOSIS — M069 Rheumatoid arthritis, unspecified: Secondary | ICD-10-CM | POA: Insufficient documentation

## 2019-07-30 DIAGNOSIS — N183 Chronic kidney disease, stage 3 unspecified: Secondary | ICD-10-CM | POA: Diagnosis not present

## 2019-07-30 DIAGNOSIS — Z79899 Other long term (current) drug therapy: Secondary | ICD-10-CM | POA: Diagnosis not present

## 2019-07-30 DIAGNOSIS — R531 Weakness: Secondary | ICD-10-CM | POA: Insufficient documentation

## 2019-07-30 DIAGNOSIS — E1122 Type 2 diabetes mellitus with diabetic chronic kidney disease: Secondary | ICD-10-CM | POA: Diagnosis not present

## 2019-07-30 DIAGNOSIS — R197 Diarrhea, unspecified: Secondary | ICD-10-CM | POA: Insufficient documentation

## 2019-07-30 DIAGNOSIS — I509 Heart failure, unspecified: Secondary | ICD-10-CM | POA: Insufficient documentation

## 2019-07-30 DIAGNOSIS — Z87891 Personal history of nicotine dependence: Secondary | ICD-10-CM | POA: Diagnosis not present

## 2019-07-30 DIAGNOSIS — I130 Hypertensive heart and chronic kidney disease with heart failure and stage 1 through stage 4 chronic kidney disease, or unspecified chronic kidney disease: Secondary | ICD-10-CM | POA: Insufficient documentation

## 2019-07-30 LAB — CBC WITH DIFFERENTIAL/PLATELET
Abs Immature Granulocytes: 1.4 10*3/uL — ABNORMAL HIGH (ref 0.00–0.07)
Band Neutrophils: 11 %
Basophils Absolute: 0 10*3/uL (ref 0.0–0.1)
Basophils Relative: 0 %
Eosinophils Absolute: 0 10*3/uL (ref 0.0–0.5)
Eosinophils Relative: 0 %
HCT: 25.6 % — ABNORMAL LOW (ref 36.0–46.0)
Hemoglobin: 8.4 g/dL — ABNORMAL LOW (ref 12.0–15.0)
Lymphocytes Relative: 13 %
Lymphs Abs: 1.6 10*3/uL (ref 0.7–4.0)
MCH: 28.3 pg (ref 26.0–34.0)
MCHC: 32.8 g/dL (ref 30.0–36.0)
MCV: 86.2 fL (ref 80.0–100.0)
Metamyelocytes Relative: 7 %
Monocytes Absolute: 1.5 10*3/uL — ABNORMAL HIGH (ref 0.1–1.0)
Monocytes Relative: 12 %
Myelocytes: 4 %
Neutro Abs: 7.3 10*3/uL (ref 1.7–7.7)
Neutrophils Relative %: 48 %
Other: 5 %
Platelets: 112 10*3/uL — ABNORMAL LOW (ref 150–400)
RBC: 2.97 MIL/uL — ABNORMAL LOW (ref 3.87–5.11)
RDW: 16.3 % — ABNORMAL HIGH (ref 11.5–15.5)
WBC: 12.3 10*3/uL — ABNORMAL HIGH (ref 4.0–10.5)
nRBC: 0.8 % — ABNORMAL HIGH (ref 0.0–0.2)
nRBC: 4 /100 WBC — ABNORMAL HIGH

## 2019-07-30 LAB — URINALYSIS, COMPLETE (UACMP) WITH MICROSCOPIC
Bilirubin Urine: NEGATIVE
Glucose, UA: NEGATIVE mg/dL
Hgb urine dipstick: NEGATIVE
Ketones, ur: NEGATIVE mg/dL
Leukocytes,Ua: NEGATIVE
Nitrite: NEGATIVE
Protein, ur: 30 mg/dL — AB
Specific Gravity, Urine: 1.01 (ref 1.005–1.030)
pH: 6 (ref 5.0–8.0)

## 2019-07-30 LAB — COMPREHENSIVE METABOLIC PANEL
ALT: 14 U/L (ref 0–44)
AST: 22 U/L (ref 15–41)
Albumin: 3.5 g/dL (ref 3.5–5.0)
Alkaline Phosphatase: 85 U/L (ref 38–126)
Anion gap: 14 (ref 5–15)
BUN: 13 mg/dL (ref 8–23)
CO2: 21 mmol/L — ABNORMAL LOW (ref 22–32)
Calcium: 8.7 mg/dL — ABNORMAL LOW (ref 8.9–10.3)
Chloride: 100 mmol/L (ref 98–111)
Creatinine, Ser: 1.07 mg/dL — ABNORMAL HIGH (ref 0.44–1.00)
GFR calc Af Amer: 58 mL/min — ABNORMAL LOW (ref >60)
GFR calc non Af Amer: 50 mL/min — ABNORMAL LOW (ref >60)
Glucose, Bld: 126 mg/dL — ABNORMAL HIGH (ref 70–99)
Potassium: 3.2 mmol/L — ABNORMAL LOW (ref 3.5–5.1)
Sodium: 135 mmol/L (ref 135–145)
Total Bilirubin: 1.2 mg/dL (ref 0.3–1.2)
Total Protein: 7.7 g/dL (ref 6.5–8.1)

## 2019-07-30 LAB — TYPE AND SCREEN
ABO/RH(D): O POS
Antibody Screen: NEGATIVE

## 2019-07-30 LAB — LIPASE, BLOOD: Lipase: 21 U/L (ref 11–51)

## 2019-07-30 MED ORDER — LOPERAMIDE HCL 2 MG PO TABS: 2.0000 mg | ORAL_TABLET | Freq: Four times a day (QID) | ORAL | 0 refills | Status: AC | PRN

## 2019-07-30 MED ORDER — SODIUM CHLORIDE 0.9 % IV BOLUS
500.0000 mL | Freq: Once | INTRAVENOUS | Status: AC
  Administered 2019-07-30: 500 mL via INTRAVENOUS

## 2019-07-30 MED ORDER — LOPERAMIDE HCL 2 MG PO CAPS
2.0000 mg | ORAL_CAPSULE | Freq: Once | ORAL | Status: AC
  Administered 2019-07-30: 2 mg via ORAL
  Filled 2019-07-30: qty 1

## 2019-07-30 NOTE — ED Provider Notes (Signed)
-----------------------------------------   4:58 PM on 07/30/2019 -----------------------------------------  I took over care on this patient from Dr. Archie Balboa.  The patient has been able to eat and she is ambulating.  She is stable for discharge home.  Return precautions provided.   Arta Silence, MD 07/30/19 1659

## 2019-07-30 NOTE — ED Notes (Signed)
This RN reiterated with patient where call bell was in the event patient needed this RN or assistance. UA sent to lab at this time. X-ray at bedside for [facility] Chest portable. Pt back to bed after using the bathroom.

## 2019-07-30 NOTE — ED Notes (Signed)
This RN to bedside, pt states she feels no better. Pt requesting ice chips, states "I told that other girl to tell you but they didn't, they always put me in the back where all the ruckus is". This RN apologized for noise and explained to patient the unpredictable nature of the ER. Pt does not appears satisfied with answer. This RN explained will speak with MD regarding patient being able to eat and drink.

## 2019-07-30 NOTE — ED Triage Notes (Addendum)
Presents with weakness and diarrhea for couple of days  States she is having a lot of gas and sharp pains in stomach

## 2019-07-30 NOTE — ED Notes (Signed)
Given Mango frozen ice

## 2019-07-30 NOTE — ED Notes (Signed)
Pt presents to ED via POV with c/o sudden onset weakness. Pt states intermittent ongoing diarrhea, states worse last night, pt states "was up and down all night". Pt states has had 4 blood transfusions in the last month, dx with lung and kidney cancer, pt states sees Dr. Erlene Quan for Kidney cancer and had partial kidney resection for cancer and see Dr. Grayland Ormond for lung cancer. Pt states recent bone marrow biopsy shows she isn't "making enough blood". Pt noted to be hypotensive upon arrival and ill-appearing in bed, pale mucous membranes upon arrival.

## 2019-07-30 NOTE — ED Provider Notes (Signed)
[facility] Emergency Department Provider Note  ____________________________________________   I have reviewed the triage vital signs and the nursing notes.   HISTORY  Chief Complaint Weakness   History limited by: Not Limited   HPI Caroline Nichols is a 78 y.o. female who presents to the emergency department today with primary concern for weakness.  Patient states that she has been dealing with multiple issues for the past 2 months.  One of her main complaints is for diarrhea.  She states that almost every time she eats something it passes right through.  She has not noticed any blood or black or tarry diarrhea.  She is also had issues with intermittent abdominal pain when that she has had her diarrhea.  The abdominal pain is somewhat generalized.  She is also complaining of weakness today.  She states that she has been weak for the past 2 months.  She has required multiple blood transfusions.  She states she continues to feel weak even after the blood transfusions.  Patient denies any fevers.  She denies any chest pain.  Records reviewed. Per medical record review patient has a history of anemia, recent transfusions. Myelodysplastic syndrome.   Past Medical History:  Diagnosis Date  . Anemia 02/2013   F/U with Dr. Grayland Ormond for Feraheme injections  . Anxiety   . Arthritis    Possible RA - Follow up appt with Dr. Jefm Bryant  . Cellulitis of arm, right   . Chest pain 03/26/2013  . Diabetes insipidus ([place])    On desmopressin  . GERD (gastroesophageal reflux disease)   . H/O seasonal allergies   . H/O transfusion of packed red blood cells 02/2013   3 units PRBC for severe anemia 02/2013  . Herniated disc   . History of kidney stones   . Hyperlipidemia   . Hypertension   . Hyperthyroidism    s/p radiation therapy, now hypothyroidism  . IBS (irritable bowel syndrome)   . Lung cancer ([place]) 06/2017   left  . Migraine   . Pacemaker    a. s/p successful  implantation of a Medtronic CapSureFix Novus MRIT SureScan serial # [id number] on 06/28/16 by Dr. Caryl Comes for sinus node dysfunction.  . Pancreatic cyst   . Paroxysmal A-fib ([place])   . Renal cell cancer, right ([place]) 10/23/2015   Right partial nephrectomy.    Patient Active Problem List   Diagnosis Date Noted  . Rheumatoid arthritis involving multiple sites, unspecified whether rheumatoid factor present ([place]) 07/18/2019  . Heart failure, unspecified HF chronicity, unspecified heart failure type ([place]) 07/18/2019  . Type 2 diabetes mellitus with stage 3a chronic kidney disease, without long-term current use of insulin ([place]) 07/18/2019  . Diabetes insipidus ([place]) 07/18/2019  . Coronary artery disease of native artery of native heart with stable angina pectoris ([place]) 07/18/2019  . MDS (myelodysplastic syndrome) ([place]) 07/11/2019  . Insomnia due to medical condition 06/23/2019  . Educated about COVID-19 virus infection 12/20/2018  . GERD (gastroesophageal reflux disease) 02/20/2018  . Night sweats 01/11/2018  . Diarrhea in adult patient 01/11/2018  . Chronic respiratory failure ([place]) 01/01/2018  . Statin intolerance 10/16/2017  . B12 deficiency 10/16/2017  . Concussion 07/20/2017  . Malignant neoplasm of lung, unspecified laterality, unspecified part of lung ([place]) 07/16/2017  . Special screening for malignant neoplasms, colon 09/11/2016  . Encounter for Medicare annual wellness exam 07/21/2016  . Vitamin D deficiency 07/21/2016  . Pacemaker   . Sinus node dysfunction ([place]) 05/23/2016  . History  of atrial fibrillation 10/28/2015  . History of renal cell carcinoma 10/23/2015  . Goals of care, counseling/discussion 10/12/2015  . Urinary frequency 07/28/2015  . Inflammatory arthritis 05/10/2015  . Nocturia 04/16/2015  . Hyperlipidemia 12/17/2014  . Major depressive disorder with current active episode 12/14/2014  . Xerostomia 11/14/2014  . S/P hysterectomy 06/09/2014  . Atherosclerotic  peripheral vascular disease with intermittent claudication ([place]) 10/07/2013  . COPD with chronic bronchitis and emphysema ([place]) 08/03/2013  . Left carotid bruit 03/26/2013  . Acquired pancytopenia ([place]) 03/20/2013  . Bradycardia 12/09/2012  . Cervical vertebral fusion 12/09/2012  . Neuropathy 12/09/2012  . History of tobacco abuse 12/09/2012  . IBS (irritable bowel syndrome)   . Degenerative arthritis of lumbar spine   . Mild mitral regurgitation 09/27/2009  . Postablative hypothyroidism 05/15/2009  . Anxiety state 05/15/2009  . Essential hypertension 05/15/2009    Past Surgical History:  Procedure Laterality Date  . ABDOMINAL HYSTERECTOMY    . BREAST EXCISIONAL BIOPSY Right 2005   neg  . BREAST SURGERY  1990s   Biopsy  . CERVICAL SPINE SURGERY     Bone fusion  . COLONOSCOPY  2008?   [place]  . COLONOSCOPY WITH PROPOFOL N/A 10/15/2016   Procedure: COLONOSCOPY WITH PROPOFOL;  Surgeon: Robert Bellow, MD;  Location: [facility] ENDOS[facility];  Service: Endoscopy;  Laterality: N/A;  . EP IMPLANTABLE DEVICE N/A 06/28/2016   Procedure: Pacemaker Implant;  Surgeon: Deboraha Sprang, MD;  Location: [place] CV LAB;  Service: Cardiovascular;  Laterality: N/A;  . HAMMER TOE SURGERY    . HERNIA REPAIR Left    Inguinal Hernia Repair  . PORTA CATH INSERTION N/A 07/24/2017   Procedure: PORTA CATH INSERTION;  Surgeon: Algernon Huxley, MD;  Location: [place] CV LAB;  Service: Cardiovascular;  Laterality: N/A;  . ROBOTIC ASSITED PARTIAL NEPHRECTOMY Right 10/23/2015   Procedure: ROBOTIC ASSITED PARTIAL NEPHRECTOMY with intraop ultrasound;  Surgeon: Hollice Espy, MD;  Location: [facility] ORS;  Service: Urology;  Laterality: Right;  . TUBAL LIGATION      Prior to Admission medications   Medication Sig Start Date End Date Taking? Authorizing Provider  acetaminophen (TYLENOL) 325 MG tablet Take 2 tablets (650 mg total) by mouth every 6 (six) hours as needed for pain. Patient taking differently:  Take 650 mg by mouth every 6 (six) hours as needed for pain.  01/04/13   Hongalgi, Lenis Dickinson, MD  albuterol (PROVENTIL HFA;VENTOLIN HFA) 108 (90 Base) MCG/ACT inhaler Inhale 2 puffs into the lungs every 6 (six) hours as needed for wheezing or shortness of breath. 12/23/17   Fritzi Mandes, MD  ALPRAZolam Duanne Moron) 0.25 MG tablet TAKE ONE TABLET BY MOUTH TWICE A DAY AS NEEDED FOR ANXIETY 07/27/19   [name], MD  brimonidine (ALPHAGAN) 0.2 % ophthalmic solution Place 1 drop into both eyes 3 (three) times daily.  10/31/14   [provider]  Calcium Carbonate-Vitamin D (CALCIUM-CARB 600 + D) 600-125 MG-UNIT TABS Take 2 tablets by mouth daily.    [provider]  carvedilol (COREG) 6.25 MG tablet 1.5 tablets twice daily with meals 06/23/19   [name], MD  cholecalciferol (VITAMIN D) 1000 units tablet Take 1,000 Units by mouth daily.    [provider]  dorzolamide (TRUSOPT) 2 % ophthalmic solution Place 1 drop into both eyes 3 (three) times daily.     [provider]  ferrous sulfate 325 (65 FE) MG tablet Take 325 mg by mouth daily with breakfast.  [provider]  furosemide (LASIX) 20 MG tablet TAKE ONE-HALF TABLET BY MOUTH DAILY Patient taking differently: Take 10 mg by mouth daily.  05/13/19   [name], MD  gabapentin (NEURONTIN) 300 MG capsule TAKE ONE CAPSULE BY MOUTH THREE TIMES A DAY AS NEEDED FOR SHINGLES 07/27/19   [name], MD  HYDROcodone-acetaminophen (NORCO) 10-325 MG tablet Take 1 tablet by mouth every 6 (six) hours as needed. 07/23/19   [name], MD  hydroxychloroquine (PLAQUENIL) 200 MG tablet Take 400 mg by mouth daily.  12/11/15   [provider]  latanoprost (XALATAN) 0.005 % ophthalmic solution Place 1 drop into both eyes at bedtime.  01/02/16   [provider]  levothyroxine (SYNTHROID) 112 MCG tablet Take 1 tablet (112 mcg total) by mouth every morning. 06/23/19   [name], MD  losartan  (COZAAR) 100 MG tablet Take 1 tablet (100 mg total) by mouth at bedtime. 06/23/19   [name], MD  mirtazapine (REMERON) 15 MG tablet Take 1 tablet (15 mg total) by mouth at bedtime. 06/23/19   [name], MD  MYRBETRIQ 50 MG TB24 tablet TAKE ONE TABLET BY MOUTH DAILY Patient taking differently: Take 50 mg by mouth daily.  01/22/19   Hollice Espy, MD  nitroGLYCERIN (NITROLINGUAL) 0.4 MG/SPRAY spray Place 1 spray under the tongue every 5 (five) minutes x 3 doses as needed for chest pain. 12/11/18   Minna Merritts, MD  Omega-3 Fatty Acids (FISH OIL) 1000 MG CAPS Take 1,000 mg by mouth daily.     [provider]  ondansetron (ZOFRAN ODT) 4 MG disintegrating tablet Take 1 tablet (4 mg total) by mouth every 8 (eight) hours as needed for nausea or vomiting. 07/16/19   [name], MD  oxybutynin (DITROPAN-XL) 10 MG 24 hr tablet TAKE ONE TABLET BY MOUTH AT BEDTIME 07/27/19   [name], MD  pantoprazole (PROTONIX) 40 MG tablet Take 1 tablet (40 mg total) by mouth 2 (two) times daily. 07/28/19   Lloyd Huger, MD  PARoxetine (PAXIL) 10 MG tablet TAKE ONE TABLET BY MOUTH DAILY AFTER DINNER Patient taking differently: Take 10 mg by mouth daily after supper.  03/10/19   [name], MD  predniSONE (DELTASONE) 10 MG tablet 6 tablets daily for 3 days,  then reduce by 1 tablet daily until gone 07/16/19   [name], MD  vitamin C (ASCORBIC ACID) 500 MG tablet Take 500 mg by mouth daily.    [provider]    Allergies Metrizamide, Adhesive [tape], Augmentin [amoxicillin-pot clavulanate], Codeine, Morphine and related, Other, Tetanus toxoid, and Tramadol  Family History  Problem Relation Age of Onset  . Heart disease Mother   . Heart disease Father   . Diabetes Brother   . Kidney disease Brother   . Stroke Daughter        due to medication reaction  . Breast cancer Cousin   . Prostate cancer Neg Hx   . Hematuria Neg Hx     Social History Social  History   Tobacco Use  . Smoking status: Former Smoker    Packs/day: 0.50    Years: 30.00    Pack years: 15.00    Types: Cigarettes    Quit date: 12/03/2012    Years since quitting: 6.6  . Smokeless tobacco: Never Used  Substance Use Topics  . Alcohol use: No    Alcohol/week: 0.0 standard drinks  . Drug use: No  Review of Systems Constitutional: No fever/chills Eyes: No visual changes. ENT: No sore throat. Cardiovascular: Denies chest pain. Respiratory: Denies shortness of breath. Gastrointestinal: Positive for abdominal pain.  Positive for diarrhea.  Genitourinary: Negative for dysuria. Musculoskeletal: Negative for back pain. Skin: Negative for rash. Neurological: Negative for headaches, focal weakness or numbness.  ____________________________________________   PHYSICAL EXAM:  VITAL SIGNS: ED Triage Vitals  Enc Vitals Group     BP 07/30/19 0928 (!) 88/53     Pulse Rate 07/30/19 0928 81     Resp 07/30/19 0928 16     Temp 07/30/19 0928 98.8 F (37.1 C)     Temp src --      SpO2 07/30/19 0928 96 %     Weight 07/30/19 0929 134 lb (60.8 kg)     Height 07/30/19 0929 5\' 2"  (1.575 m)   Constitutional: Alert and oriented.  Eyes: Conjunctivae are normal.  ENT      Head: Normocephalic and atraumatic.      Nose: No congestion/rhinnorhea.      Mouth/Throat: Mucous membranes are moist.      Neck: No stridor. Hematological/Lymphatic/Immunilogical: No cervical lymphadenopathy. Cardiovascular: Normal rate, regular rhythm.  No murmurs, rubs, or gallops.  Respiratory: Normal respiratory effort without tachypnea nor retractions. Breath sounds are clear and equal bilaterally. No wheezes/rales/rhonchi. Gastrointestinal: Soft and non tender. No rebound. No guarding.  Genitourinary: Deferred Musculoskeletal: Normal range of motion in all extremities. No lower extremity edema. Neurologic:  Normal speech and language. No gross focal neurologic deficits are appreciated.  Skin:   Skin is warm, dry and intact. No rash noted. Psychiatric: Mood and affect are normal. Speech and behavior are normal. Patient exhibits appropriate insight and judgment.  ____________________________________________    LABS (pertinent positives/negatives)  CMP na 135, k 3.2, glu 126, ca 8.7 CBC wbc 12.3, hgb 8.4, plt 112 UA hazy, rare bacteria, 6-10 squamous Lipase 21 ____________________________________________   EKG  I, Nance Pear, attending physician, personally viewed and interpreted this EKG  EKG Time: 0944 Rate: 81 Rhythm: sinus rhythm Axis: normal Intervals: qtc 596 QRS: narrow, q waves v1 ST changes: no st elevation Impression: abnormal ekg ____________________________________________    RADIOLOGY  CXR Stable chest x-ray  ____________________________________________   PROCEDURES  Procedures  ____________________________________________   INITIAL IMPRESSION / ASSESSMENT AND PLAN / ED COURSE  Pertinent labs & imaging results that were available during my care of the patient were reviewed by me and considered in my medical decision making (see chart for details).   Patient presented to the emergency department today because of concern for continued diarrhea and weakness. Her symptoms have been ongoing for the past 2 months. Cannot tell if there was any specific new symptom today. Blood work with very mild leukocytosis and anemia. Patient was given IV fluids. Will PO challenge and ambulate. Do think it would be reasonable to discharge if patient tolerated those challenges well.  ____________________________________________   FINAL CLINICAL IMPRESSION(S) / ED DIAGNOSES  Final diagnoses:  Weakness  Diarrhea, unspecified type     Note: This dictation was prepared with Dragon dictation. Any transcriptional errors that result from this process are unintentional     Nance Pear, MD 07/30/19 1555

## 2019-07-30 NOTE — Discharge Instructions (Signed)
Please seek medical attention for any high fevers, chest pain, shortness of breath, change in behavior, persistent vomiting, bloody stool or any other new or concerning symptoms.  

## 2019-07-30 NOTE — ED Notes (Signed)
Pt assisted to the bathroom at this time. UA collected by this RN and sent to the lab.

## 2019-07-30 NOTE — ED Notes (Addendum)
Pt given ice water and meal tray at this time with MD okay. Pt refusing the meal tray due to "my belly pain hasn't been addressed". This RN explained that patient stated to this RN when this RN rounded on patient that she hadn't eaten or drank anything in days and patient stated dissatisfaction with care regarding not getting anything to eat or drink. Pt states she is afraid that she will have diarrhea if she eats. Pt has had no episodes of diarrhea since her arrival to ED.

## 2019-07-30 NOTE — ED Notes (Signed)
Pt continues to rest in bed. Explained continuing to wait for rest of blood work. Pt states understanding. Pt with personal cell phone at bedside.

## 2019-08-02 LAB — PATHOLOGIST SMEAR REVIEW

## 2019-08-03 ENCOUNTER — Other Ambulatory Visit: Payer: Self-pay

## 2019-08-03 ENCOUNTER — Encounter: Payer: Self-pay | Admitting: Oncology

## 2019-08-03 ENCOUNTER — Other Ambulatory Visit (INDEPENDENT_AMBULATORY_CARE_PROVIDER_SITE_OTHER): Payer: Medicare Other

## 2019-08-03 ENCOUNTER — Emergency Department: Payer: Medicare Other

## 2019-08-03 ENCOUNTER — Encounter: Payer: Self-pay | Admitting: Emergency Medicine

## 2019-08-03 ENCOUNTER — Inpatient Hospital Stay: Payer: Medicare Other

## 2019-08-03 ENCOUNTER — Inpatient Hospital Stay
Admission: EM | Admit: 2019-08-03 | Discharge: 2019-08-06 | DRG: 640 | Disposition: E | Payer: Medicare Other | Attending: Internal Medicine | Admitting: Internal Medicine

## 2019-08-03 DIAGNOSIS — K589 Irritable bowel syndrome without diarrhea: Secondary | ICD-10-CM | POA: Diagnosis present

## 2019-08-03 DIAGNOSIS — C7B8 Other secondary neuroendocrine tumors: Secondary | ICD-10-CM | POA: Diagnosis not present

## 2019-08-03 DIAGNOSIS — Z85528 Personal history of other malignant neoplasm of kidney: Secondary | ICD-10-CM | POA: Diagnosis not present

## 2019-08-03 DIAGNOSIS — E860 Dehydration: Secondary | ICD-10-CM | POA: Diagnosis present

## 2019-08-03 DIAGNOSIS — R627 Adult failure to thrive: Secondary | ICD-10-CM | POA: Diagnosis present

## 2019-08-03 DIAGNOSIS — D849 Immunodeficiency, unspecified: Secondary | ICD-10-CM | POA: Diagnosis present

## 2019-08-03 DIAGNOSIS — J9601 Acute respiratory failure with hypoxia: Secondary | ICD-10-CM | POA: Diagnosis present

## 2019-08-03 DIAGNOSIS — Z881 Allergy status to other antibiotic agents status: Secondary | ICD-10-CM

## 2019-08-03 DIAGNOSIS — R6510 Systemic inflammatory response syndrome (SIRS) of non-infectious origin without acute organ dysfunction: Secondary | ICD-10-CM

## 2019-08-03 DIAGNOSIS — Z950 Presence of cardiac pacemaker: Secondary | ICD-10-CM | POA: Diagnosis not present

## 2019-08-03 DIAGNOSIS — E232 Diabetes insipidus: Secondary | ICD-10-CM | POA: Diagnosis present

## 2019-08-03 DIAGNOSIS — R0902 Hypoxemia: Secondary | ICD-10-CM

## 2019-08-03 DIAGNOSIS — I480 Paroxysmal atrial fibrillation: Secondary | ICD-10-CM | POA: Diagnosis present

## 2019-08-03 DIAGNOSIS — I959 Hypotension, unspecified: Secondary | ICD-10-CM | POA: Diagnosis not present

## 2019-08-03 DIAGNOSIS — J432 Centrilobular emphysema: Secondary | ICD-10-CM | POA: Diagnosis present

## 2019-08-03 DIAGNOSIS — K219 Gastro-esophageal reflux disease without esophagitis: Secondary | ICD-10-CM | POA: Diagnosis present

## 2019-08-03 DIAGNOSIS — Z20828 Contact with and (suspected) exposure to other viral communicable diseases: Secondary | ICD-10-CM | POA: Diagnosis present

## 2019-08-03 DIAGNOSIS — E1122 Type 2 diabetes mellitus with diabetic chronic kidney disease: Secondary | ICD-10-CM | POA: Diagnosis present

## 2019-08-03 DIAGNOSIS — M069 Rheumatoid arthritis, unspecified: Secondary | ICD-10-CM | POA: Diagnosis present

## 2019-08-03 DIAGNOSIS — Z85118 Personal history of other malignant neoplasm of bronchus and lung: Secondary | ICD-10-CM

## 2019-08-03 DIAGNOSIS — Z888 Allergy status to other drugs, medicaments and biological substances status: Secondary | ICD-10-CM

## 2019-08-03 DIAGNOSIS — I129 Hypertensive chronic kidney disease with stage 1 through stage 4 chronic kidney disease, or unspecified chronic kidney disease: Secondary | ICD-10-CM | POA: Diagnosis present

## 2019-08-03 DIAGNOSIS — R0603 Acute respiratory distress: Secondary | ICD-10-CM | POA: Diagnosis not present

## 2019-08-03 DIAGNOSIS — Z905 Acquired absence of kidney: Secondary | ICD-10-CM

## 2019-08-03 DIAGNOSIS — Z887 Allergy status to serum and vaccine status: Secondary | ICD-10-CM

## 2019-08-03 DIAGNOSIS — R5383 Other fatigue: Secondary | ICD-10-CM | POA: Diagnosis not present

## 2019-08-03 DIAGNOSIS — I248 Other forms of acute ischemic heart disease: Secondary | ICD-10-CM | POA: Diagnosis present

## 2019-08-03 DIAGNOSIS — E890 Postprocedural hypothyroidism: Secondary | ICD-10-CM | POA: Diagnosis present

## 2019-08-03 DIAGNOSIS — R197 Diarrhea, unspecified: Secondary | ICD-10-CM | POA: Diagnosis present

## 2019-08-03 DIAGNOSIS — Z87891 Personal history of nicotine dependence: Secondary | ICD-10-CM

## 2019-08-03 DIAGNOSIS — D61818 Other pancytopenia: Secondary | ICD-10-CM | POA: Diagnosis present

## 2019-08-03 DIAGNOSIS — R531 Weakness: Secondary | ICD-10-CM

## 2019-08-03 DIAGNOSIS — I10 Essential (primary) hypertension: Secondary | ICD-10-CM | POA: Diagnosis not present

## 2019-08-03 DIAGNOSIS — I700 Atherosclerosis of aorta: Secondary | ICD-10-CM | POA: Diagnosis present

## 2019-08-03 DIAGNOSIS — Z8249 Family history of ischemic heart disease and other diseases of the circulatory system: Secondary | ICD-10-CM

## 2019-08-03 DIAGNOSIS — N1831 Chronic kidney disease, stage 3a: Secondary | ICD-10-CM | POA: Diagnosis present

## 2019-08-03 DIAGNOSIS — Z833 Family history of diabetes mellitus: Secondary | ICD-10-CM

## 2019-08-03 DIAGNOSIS — R778 Other specified abnormalities of plasma proteins: Secondary | ICD-10-CM

## 2019-08-03 DIAGNOSIS — G43909 Migraine, unspecified, not intractable, without status migrainosus: Secondary | ICD-10-CM | POA: Diagnosis present

## 2019-08-03 DIAGNOSIS — Y842 Radiological procedure and radiotherapy as the cause of abnormal reaction of the patient, or of later complication, without mention of misadventure at the time of the procedure: Secondary | ICD-10-CM | POA: Diagnosis present

## 2019-08-03 DIAGNOSIS — E785 Hyperlipidemia, unspecified: Secondary | ICD-10-CM | POA: Diagnosis present

## 2019-08-03 DIAGNOSIS — Z841 Family history of disorders of kidney and ureter: Secondary | ICD-10-CM

## 2019-08-03 DIAGNOSIS — Z923 Personal history of irradiation: Secondary | ICD-10-CM

## 2019-08-03 DIAGNOSIS — K529 Noninfective gastroenteritis and colitis, unspecified: Secondary | ICD-10-CM | POA: Diagnosis present

## 2019-08-03 DIAGNOSIS — Z515 Encounter for palliative care: Secondary | ICD-10-CM | POA: Diagnosis not present

## 2019-08-03 DIAGNOSIS — F329 Major depressive disorder, single episode, unspecified: Secondary | ICD-10-CM | POA: Diagnosis present

## 2019-08-03 DIAGNOSIS — R0682 Tachypnea, not elsewhere classified: Secondary | ICD-10-CM | POA: Diagnosis not present

## 2019-08-03 DIAGNOSIS — Z66 Do not resuscitate: Secondary | ICD-10-CM | POA: Diagnosis not present

## 2019-08-03 DIAGNOSIS — M1990 Unspecified osteoarthritis, unspecified site: Secondary | ICD-10-CM | POA: Diagnosis present

## 2019-08-03 DIAGNOSIS — Z823 Family history of stroke: Secondary | ICD-10-CM

## 2019-08-03 DIAGNOSIS — N179 Acute kidney failure, unspecified: Secondary | ICD-10-CM | POA: Diagnosis not present

## 2019-08-03 DIAGNOSIS — I340 Nonrheumatic mitral (valve) insufficiency: Secondary | ICD-10-CM | POA: Diagnosis present

## 2019-08-03 DIAGNOSIS — C3490 Malignant neoplasm of unspecified part of unspecified bronchus or lung: Secondary | ICD-10-CM

## 2019-08-03 DIAGNOSIS — F419 Anxiety disorder, unspecified: Secondary | ICD-10-CM | POA: Diagnosis present

## 2019-08-03 DIAGNOSIS — D649 Anemia, unspecified: Secondary | ICD-10-CM | POA: Diagnosis not present

## 2019-08-03 DIAGNOSIS — D469 Myelodysplastic syndrome, unspecified: Secondary | ICD-10-CM

## 2019-08-03 DIAGNOSIS — R339 Retention of urine, unspecified: Secondary | ICD-10-CM | POA: Diagnosis not present

## 2019-08-03 DIAGNOSIS — C7A8 Other malignant neuroendocrine tumors: Secondary | ICD-10-CM | POA: Diagnosis not present

## 2019-08-03 DIAGNOSIS — Z803 Family history of malignant neoplasm of breast: Secondary | ICD-10-CM

## 2019-08-03 DIAGNOSIS — Z6824 Body mass index (BMI) 24.0-24.9, adult: Secondary | ICD-10-CM

## 2019-08-03 DIAGNOSIS — G893 Neoplasm related pain (acute) (chronic): Secondary | ICD-10-CM | POA: Diagnosis present

## 2019-08-03 LAB — CBC WITH DIFFERENTIAL/PLATELET
Abs Immature Granulocytes: 1.73 10*3/uL — ABNORMAL HIGH (ref 0.00–0.07)
Basophils Absolute: 0.3 10*3/uL — ABNORMAL HIGH (ref 0.0–0.1)
Basophils Relative: 4 %
Eosinophils Absolute: 0 10*3/uL (ref 0.0–0.5)
Eosinophils Relative: 0 %
HCT: 23.5 % — ABNORMAL LOW (ref 36.0–46.0)
Hemoglobin: 7.4 g/dL — ABNORMAL LOW (ref 12.0–15.0)
Immature Granulocytes: 20 %
Lymphocytes Relative: 17 %
Lymphs Abs: 1.4 10*3/uL (ref 0.7–4.0)
MCH: 28.8 pg (ref 26.0–34.0)
MCHC: 31.5 g/dL (ref 30.0–36.0)
MCV: 91.4 fL (ref 80.0–100.0)
Monocytes Absolute: 2 10*3/uL — ABNORMAL HIGH (ref 0.1–1.0)
Monocytes Relative: 24 %
Neutro Abs: 3 10*3/uL (ref 1.7–7.7)
Neutrophils Relative %: 35 %
Platelets: 77 10*3/uL — ABNORMAL LOW (ref 150–400)
RBC: 2.57 MIL/uL — ABNORMAL LOW (ref 3.87–5.11)
RDW: 16.7 % — ABNORMAL HIGH (ref 11.5–15.5)
WBC: 8.5 10*3/uL (ref 4.0–10.5)
nRBC: 1.6 % — ABNORMAL HIGH (ref 0.0–0.2)

## 2019-08-03 LAB — BASIC METABOLIC PANEL
Anion gap: 12 (ref 5–15)
BUN: 23 mg/dL (ref 8–23)
CO2: 23 mmol/L (ref 22–32)
Calcium: 8.8 mg/dL — ABNORMAL LOW (ref 8.9–10.3)
Chloride: 103 mmol/L (ref 98–111)
Creatinine, Ser: 0.8 mg/dL (ref 0.44–1.00)
GFR calc Af Amer: >60 mL/min (ref >60)
GFR calc non Af Amer: >60 mL/min (ref >60)
Glucose, Bld: 114 mg/dL — ABNORMAL HIGH (ref 70–99)
Potassium: 3.5 mmol/L (ref 3.5–5.1)
Sodium: 138 mmol/L (ref 135–145)

## 2019-08-03 LAB — TROPONIN I (HIGH SENSITIVITY)
Troponin I (High Sensitivity): 152 ng/L (ref <18)
Troponin I (High Sensitivity): 144 ng/L (ref <18)

## 2019-08-03 LAB — URINALYSIS, COMPLETE (UACMP) WITH MICROSCOPIC
Bacteria, UA: NONE SEEN
Bilirubin Urine: NEGATIVE
Glucose, UA: NEGATIVE mg/dL
Hgb urine dipstick: NEGATIVE
Ketones, ur: NEGATIVE mg/dL
Leukocytes,Ua: NEGATIVE
Nitrite: NEGATIVE
Protein, ur: 100 mg/dL — AB
Specific Gravity, Urine: 1.02 (ref 1.005–1.030)
pH: 6 (ref 5.0–8.0)

## 2019-08-03 LAB — LACTIC ACID, PLASMA: Lactic Acid, Venous: 1.2 mmol/L (ref 0.5–1.9)

## 2019-08-03 LAB — HEMOGLOBIN: Hemoglobin: 7.9 g/dL — ABNORMAL LOW (ref 12.0–15.0)

## 2019-08-03 LAB — RESPIRATORY PANEL BY RT PCR (FLU A&B, COVID)
Influenza A by PCR: NEGATIVE
Influenza B by PCR: NEGATIVE
SARS Coronavirus 2 by RT PCR: NEGATIVE

## 2019-08-03 LAB — PREPARE RBC (CROSSMATCH)

## 2019-08-03 LAB — PROCALCITONIN: Procalcitonin: 3.83 ng/mL

## 2019-08-03 LAB — BRAIN NATRIURETIC PEPTIDE: B Natriuretic Peptide: 674 pg/mL — ABNORMAL HIGH (ref 0.0–100.0)

## 2019-08-03 LAB — GLUCOSE, CAPILLARY: Glucose-Capillary: 130 mg/dL — ABNORMAL HIGH (ref 70–99)

## 2019-08-03 MED ORDER — ALBUTEROL SULFATE (2.5 MG/3ML) 0.083% IN NEBU: 2.5000 mg | INHALATION_SOLUTION | Freq: Four times a day (QID) | RESPIRATORY_TRACT | Status: DC | PRN

## 2019-08-03 MED ORDER — HEPARIN SOD (PORK) LOCK FLUSH 100 UNIT/ML IV SOLN
500.0000 [IU] | Freq: Every day | INTRAVENOUS | Status: DC | PRN
  Filled 2019-08-03: qty 5

## 2019-08-03 MED ORDER — CHLORHEXIDINE GLUCONATE CLOTH 2 % EX PADS
6.0000 | MEDICATED_PAD | Freq: Every day | CUTANEOUS | Status: DC
  Administered 2019-08-04: 09:00:00 6 via TOPICAL

## 2019-08-03 MED ORDER — CALCIUM CARBONATE-VITAMIN D 500-200 MG-UNIT PO TABS
1.0000 | ORAL_TABLET | Freq: Every day | ORAL | Status: DC
  Administered 2019-08-04: 1 via ORAL
  Filled 2019-08-03 (×2): qty 1

## 2019-08-03 MED ORDER — ONDANSETRON 4 MG PO TBDP
4.0000 mg | ORAL_TABLET | Freq: Three times a day (TID) | ORAL | Status: DC | PRN
  Filled 2019-08-03: qty 1

## 2019-08-03 MED ORDER — SODIUM CHLORIDE 0.9 % IV SOLN: INTRAVENOUS | Status: DC

## 2019-08-03 MED ORDER — CARVEDILOL 6.25 MG PO TABS
9.3750 mg | ORAL_TABLET | Freq: Two times a day (BID) | ORAL | Status: DC
  Administered 2019-08-03 – 2019-08-04 (×2): 9.375 mg via ORAL
  Filled 2019-08-03 (×2): qty 2

## 2019-08-03 MED ORDER — MORPHINE SULFATE (PF) 2 MG/ML IV SOLN
INTRAVENOUS | Status: AC
  Filled 2019-08-03: qty 1

## 2019-08-03 MED ORDER — PAROXETINE HCL 20 MG PO TABS
10.0000 mg | ORAL_TABLET | Freq: Every day | ORAL | Status: DC
  Filled 2019-08-03: qty 1
  Filled 2019-08-03 (×2): qty 0.5
  Filled 2019-08-03 (×2): qty 1

## 2019-08-03 MED ORDER — VITAMIN D 25 MCG (1000 UNIT) PO TABS
1000.0000 [IU] | ORAL_TABLET | Freq: Every day | ORAL | Status: DC
  Administered 2019-08-03 – 2019-08-04 (×2): 1000 [IU] via ORAL
  Filled 2019-08-03 (×3): qty 1

## 2019-08-03 MED ORDER — SACCHAROMYCES BOULARDII 250 MG PO CAPS
250.0000 mg | ORAL_CAPSULE | Freq: Two times a day (BID) | ORAL | Status: DC
  Administered 2019-08-04: 09:00:00 250 mg via ORAL
  Filled 2019-08-03 (×5): qty 1

## 2019-08-03 MED ORDER — SODIUM CHLORIDE 0.9% FLUSH: 3.0000 mL | INTRAVENOUS | Status: DC | PRN

## 2019-08-03 MED ORDER — LEVOTHYROXINE SODIUM 100 MCG PO TABS: 100.0000 ug | ORAL_TABLET | Freq: Every day | ORAL | Status: DC

## 2019-08-03 MED ORDER — OXYBUTYNIN CHLORIDE ER 10 MG PO TB24
10.0000 mg | ORAL_TABLET | Freq: Every day | ORAL | Status: DC
  Filled 2019-08-03 (×3): qty 1

## 2019-08-03 MED ORDER — SODIUM CHLORIDE 0.9% FLUSH: 10.0000 mL | INTRAVENOUS | Status: DC | PRN

## 2019-08-03 MED ORDER — THIAMINE HCL 100 MG PO TABS
100.0000 mg | ORAL_TABLET | Freq: Every day | ORAL | Status: DC
  Administered 2019-08-03 – 2019-08-04 (×2): 100 mg via ORAL
  Filled 2019-08-03 (×2): qty 1

## 2019-08-03 MED ORDER — GABAPENTIN 300 MG PO CAPS
300.0000 mg | ORAL_CAPSULE | Freq: Three times a day (TID) | ORAL | Status: DC
  Administered 2019-08-03 – 2019-08-04 (×2): 300 mg via ORAL
  Filled 2019-08-03 (×2): qty 1

## 2019-08-03 MED ORDER — MORPHINE SULFATE (PF) 2 MG/ML IV SOLN: 2.0000 mg | INTRAVENOUS | Status: DC | PRN

## 2019-08-03 MED ORDER — ACETAMINOPHEN 325 MG PO TABS
650.0000 mg | ORAL_TABLET | Freq: Four times a day (QID) | ORAL | Status: DC | PRN
  Administered 2019-08-04: 650 mg via ORAL
  Filled 2019-08-03: qty 2

## 2019-08-03 MED ORDER — ALPRAZOLAM 0.25 MG PO TABS: 0.2500 mg | ORAL_TABLET | Freq: Two times a day (BID) | ORAL | Status: DC | PRN

## 2019-08-03 MED ORDER — ASCORBIC ACID 500 MG PO TABS
500.0000 mg | ORAL_TABLET | Freq: Every day | ORAL | Status: DC
  Administered 2019-08-03 – 2019-08-04 (×2): 500 mg via ORAL
  Filled 2019-08-03 (×2): qty 1

## 2019-08-03 MED ORDER — NITROGLYCERIN 0.4 MG SL SUBL: 0.4000 mg | SUBLINGUAL_TABLET | SUBLINGUAL | Status: DC | PRN

## 2019-08-03 MED ORDER — MIRTAZAPINE 15 MG PO TABS: 15.0000 mg | ORAL_TABLET | Freq: Every day | ORAL | Status: DC

## 2019-08-03 MED ORDER — HYDROXYCHLOROQUINE SULFATE 200 MG PO TABS
400.0000 mg | ORAL_TABLET | Freq: Every day | ORAL | Status: DC
  Administered 2019-08-03 – 2019-08-04 (×2): 400 mg via ORAL
  Filled 2019-08-03 (×2): qty 2

## 2019-08-03 MED ORDER — LOSARTAN POTASSIUM 50 MG PO TABS: 100.0000 mg | ORAL_TABLET | Freq: Every day | ORAL | Status: DC

## 2019-08-03 MED ORDER — FOLIC ACID 1 MG PO TABS
1.0000 mg | ORAL_TABLET | Freq: Every day | ORAL | Status: DC
  Administered 2019-08-03 – 2019-08-04 (×2): 1 mg via ORAL
  Filled 2019-08-03 (×2): qty 1

## 2019-08-03 MED ORDER — FERROUS SULFATE 325 (65 FE) MG PO TABS
325.0000 mg | ORAL_TABLET | Freq: Every day | ORAL | Status: DC
  Administered 2019-08-04: 09:00:00 325 mg via ORAL
  Filled 2019-08-03 (×2): qty 1

## 2019-08-03 MED ORDER — BRIMONIDINE TARTRATE 0.2 % OP SOLN
1.0000 [drp] | Freq: Two times a day (BID) | OPHTHALMIC | Status: DC
  Administered 2019-08-03 – 2019-08-04 (×3): 1 [drp] via OPHTHALMIC
  Filled 2019-08-03: qty 5

## 2019-08-03 MED ORDER — HYDROCODONE-ACETAMINOPHEN 10-325 MG PO TABS: 1.0000 | ORAL_TABLET | Freq: Four times a day (QID) | ORAL | Status: DC | PRN

## 2019-08-03 MED ORDER — HEPARIN SOD (PORK) LOCK FLUSH 100 UNIT/ML IV SOLN
250.0000 [IU] | INTRAVENOUS | Status: DC | PRN
  Filled 2019-08-03: qty 5

## 2019-08-03 MED ORDER — ENOXAPARIN SODIUM 40 MG/0.4ML ~~LOC~~ SOLN: 40.0000 mg | SUBCUTANEOUS | Status: DC

## 2019-08-03 MED ORDER — HYDROMORPHONE HCL 1 MG/ML IJ SOLN
0.5000 mg | INTRAMUSCULAR | Status: DC | PRN
  Administered 2019-08-03: 0.5 mg via INTRAVENOUS
  Filled 2019-08-03: qty 0.5

## 2019-08-03 MED ORDER — TRAZODONE HCL 50 MG PO TABS: 50.0000 mg | ORAL_TABLET | Freq: Every evening | ORAL | Status: DC | PRN

## 2019-08-03 MED ORDER — IOHEXOL 350 MG/ML SOLN
75.0000 mL | Freq: Once | INTRAVENOUS | Status: AC | PRN
  Administered 2019-08-03: 12:00:00 75 mL via INTRAVENOUS

## 2019-08-03 MED ORDER — SODIUM CHLORIDE 0.9 % IV BOLUS
500.0000 mL | Freq: Once | INTRAVENOUS | Status: AC
  Administered 2019-08-03: 500 mL via INTRAVENOUS

## 2019-08-03 MED ORDER — LATANOPROST 0.005 % OP SOLN
1.0000 [drp] | Freq: Every day | OPHTHALMIC | Status: DC
  Administered 2019-08-03: 21:00:00 1 [drp] via OPHTHALMIC
  Filled 2019-08-03: qty 2.5

## 2019-08-03 MED ORDER — PANTOPRAZOLE SODIUM 40 MG PO TBEC
40.0000 mg | DELAYED_RELEASE_TABLET | Freq: Two times a day (BID) | ORAL | Status: DC
  Administered 2019-08-03 – 2019-08-04 (×2): 40 mg via ORAL
  Filled 2019-08-03 (×2): qty 1

## 2019-08-03 MED ORDER — DORZOLAMIDE HCL 2 % OP SOLN
1.0000 [drp] | Freq: Two times a day (BID) | OPHTHALMIC | Status: DC
  Administered 2019-08-03 – 2019-08-04 (×3): 1 [drp] via OPHTHALMIC
  Filled 2019-08-03 (×2): qty 10

## 2019-08-03 NOTE — Progress Notes (Signed)
Instructed by rapid response team to stop maintenance fluids.  Caroline Nichols

## 2019-08-03 NOTE — Progress Notes (Signed)
OT Cancellation Note  Patient Details Name: Caroline Nichols MRN: [id number] DOB: 1940/08/20   Cancelled Treatment:    Reason Eval/Treat Not Completed: Medical issues which prohibited therapy. Consult received, chart reviewed. Per nursing pt's most recent resting RR of 49 is accurate with nursing stating pt is preparing for transfer to the floor.  Will hold OT eval at this time secondary to elevated resting RR and general dyspnea per nursing.  Will attempt to see pt at a future date/time as medically appropriate.  Jeni Salles, MPH, MS, OTR/L ascom [phone] 07/16/2019, 4:45 PM

## 2019-08-03 NOTE — ED Notes (Signed)
Pt remains oriented. Is drowsy.  No signs of reaction to blood at this time.

## 2019-08-03 NOTE — ED Triage Notes (Signed)
Weakness and diarrhea X 2 weeks. Pt appears very pale. BG 123 with EMS. Oriented at this time.labored breathing noted

## 2019-08-03 NOTE — Progress Notes (Signed)
   07/14/2019 1400  Clinical Encounter Type  Visited With Patient  Visit Type Initial;Psychological support;Spiritual support  Referral From Nurse  Spiritual Encounters  Spiritual Needs Prayer  Stress Factors  Patient Stress Factors Health changes;Loss of control  Ch was paged for emotional and spiritual support. Pt was lying in bed resting with the lights dimmed. Pt said she has had tough few weeks since her medication change on Oct 18th. Ch provided empathic listening and offered a prayer. Ch prayed with the patient for healing and comfort.

## 2019-08-03 NOTE — Consult Note (Signed)
[place]  [phone] [phone]   Name: Caroline Nichols Date: 07/28/2019 MRN: [id number]  DOB: 04/14/1941  Patient Care Team: [name], MD as PCP - General (Internal Medicine) [name], MD (Internal Medicine) [name] [name], MD (General Surgery) Lloyd Huger, MD as Consulting Physician (Oncology) Noreene Filbert, MD as Referring Physician (Radiation Oncology)    REASON FOR CONSULTATION: Caroline Nichols is a 78 y.o. 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  Patient has multiple recent ER visits for weakness, poor oral intake, and diarrhea.  She was admitted for same on 07/14/2019.  Palliative care was consulted to help address goals and manage ongoing symptoms.  SOCIAL HISTORY:     reports that she quit smoking about 6 years ago. Her smoking use included cigarettes. She has a 15.00 pack-year smoking history. She has never used smokeless tobacco. She reports that she does not drink alcohol or use drugs.  Patient is unmarried.  She is a resident at [facility].  She has 4 daughters who are involved in her care.  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  ADVANCE DIRECTIVES:  Not on file  CODE STATUS: Full code  PAST MEDICAL HISTORY: Past Medical History:  Diagnosis Date  . Anemia 02/2013   F/U with Dr. Grayland Ormond for Feraheme injections  . Anxiety   . Arthritis    Possible RA - Follow up appt with Dr. Jefm Bryant  . Cellulitis of arm, right   . Chest pain 03/26/2013  . Diabetes insipidus ([place])    On desmopressin  . GERD (gastroesophageal reflux disease)   . H/O seasonal allergies   . H/O transfusion of packed red blood cells  02/2013   3 units PRBC for severe anemia 02/2013  . Herniated disc   . History of kidney stones   . Hyperlipidemia   . Hypertension   . Hyperthyroidism    s/p radiation therapy, now hypothyroidism  . IBS (irritable bowel syndrome)   . Lung cancer ([place]) 06/2017   left  . Migraine   . Pacemaker    a. s/p successful implantation of a Medtronic CapSureFix Novus MRIT SureScan serial # [id number] on 06/28/16 by Dr. Caryl Comes for sinus node dysfunction.  . Pancreatic cyst   . Paroxysmal A-fib ([place])   . Renal cell cancer, right ([place]) 10/23/2015   Right partial nephrectomy.    PAST SURGICAL HISTORY:  Past Surgical History:  Procedure Laterality Date  . ABDOMINAL HYSTERECTOMY    . BREAST EXCISIONAL BIOPSY Right 2005   neg  . BREAST SURGERY  1990s   Biopsy  . CERVICAL SPINE SURGERY     Bone fusion  . COLONOSCOPY  2008?   [place]  . COLONOSCOPY WITH PROPOFOL N/A 10/15/2016   Procedure: COLONOSCOPY WITH PROPOFOL;  Surgeon: Robert Bellow, MD;  Location: [facility] ENDOSCOPY;  Service: Endoscopy;  Laterality: N/A;  . EP IMPLANTABLE DEVICE N/A 06/28/2016   Procedure: Pacemaker Implant;  Surgeon: Deboraha Sprang, MD;  Location: [facility] LAB;  Service: Cardiovascular;  Laterality: N/A;  . HAMMER TOE SURGERY    . HERNIA REPAIR Left    Inguinal Hernia Repair  . PORTA CATH INSERTION N/A 07/24/2017   Procedure: PORTA CATH INSERTION;  Surgeon: Lucky Cowboy,  Erskine Squibb, MD;  Location: [facility];  Service: Cardiovascular;  Laterality: N/A;  . ROBOTIC ASSITED PARTIAL NEPHRECTOMY Right 10/23/2015   Procedure: ROBOTIC ASSITED PARTIAL NEPHRECTOMY with intraop ultrasound;  Surgeon: Hollice Espy, MD;  Location: [facility] ORS;  Service: Urology;  Laterality: Right;  . TUBAL LIGATION      HEMATOLOGY/ONCOLOGY HISTORY:  Oncology History Overview Note  Stage IIb left lung large cell neuroendocrine carcinoma: Biopsy confirms second primary.  Patient completed cycle 3 of carboplatinum and etoposide on  September 19, 2017.  She also completed XRT.  Given her persistent pancytopenia, treatment was discontinued.  Restaging PET scan from January 12, 2018 reviewed independently and reported as above with no findings to suggest recurrent or metastatic disease     Malignant neoplasm of lung, unspecified laterality, unspecified part of lung ([place])  07/16/2017 Initial Diagnosis   Neuroendocrine carcinoma of lung ([place])     ALLERGIES:  is allergic to metrizamide; adhesive [tape]; augmentin [amoxicillin-pot clavulanate]; codeine; morphine and related; other; tetanus toxoid; and tramadol.  MEDICATIONS:  Current Facility-Administered Medications  Medication Dose Route Frequency Provider Last Rate Last Admin  . 0.9 %  sodium chloride infusion   Intravenous Continuous Sreenath, Sudheer B, MD      . acetaminophen (TYLENOL) tablet 650 mg  650 mg Oral Q6H PRN Sreenath, Sudheer B, MD      . albuterol (PROVENTIL) (2.5 MG/3ML) 0.083% nebulizer solution 2.5 mg  2.5 mg Inhalation Q6H PRN Priscella Mann, Sudheer B, MD      . ALPRAZolam Duanne Moron) tablet 0.25 mg  0.25 mg Oral BID PRN Priscella Mann, Sudheer B, MD      . ascorbic acid (VITAMIN C) tablet 500 mg  500 mg Oral Daily Sreenath, Sudheer B, MD      . brimonidine (ALPHAGAN) 0.2 % ophthalmic solution 1 drop  1 drop Both Eyes BID Priscella Mann, Sudheer B, MD      . Derrill Memo ON 08/04/2019] calcium-vitamin D (OSCAL WITH D) 500-200 MG-UNIT per tablet 1 tablet  1 tablet Oral Daily Sreenath, Sudheer B, MD      . carvedilol (COREG) tablet 9.375 mg  9.375 mg Oral BID WC Sreenath, Sudheer B, MD      . cholecalciferol (VITAMIN D3) tablet 1,000 Units  1,000 Units Oral Daily Sreenath, Sudheer B, MD      . dorzolamide (TRUSOPT) 2 % ophthalmic solution 1 drop  1 drop Both Eyes BID Priscella Mann, Sudheer B, MD      . Derrill Memo ON 08/04/2019] ferrous sulfate tablet 325 mg  325 mg Oral Q breakfast Sreenath, Sudheer B, MD      . folic acid (FOLVITE) tablet 1 mg  1 mg Oral Daily Sreenath, Sudheer B, MD      .  gabapentin (NEURONTIN) capsule 300 mg  300 mg Oral TID Priscella Mann, Sudheer B, MD      . heparin lock flush 100 unit/mL  250 Units Intracatheter PRN Lloyd Huger, MD      . heparin lock flush 100 unit/mL  500 Units Intracatheter Daily PRN Lloyd Huger, MD      . heparin lock flush 100 unit/mL  250 Units Intracatheter PRN Lloyd Huger, MD      . HYDROcodone-acetaminophen (NORCO) 10-325 MG per tablet 1 tablet  1 tablet Oral Q6H PRN Sreenath, Sudheer B, MD      . hydroxychloroquine (PLAQUENIL) tablet 400 mg  400 mg Oral Daily Sreenath, Sudheer B, MD      . latanoprost (XALATAN) 0.005 % ophthalmic solution  1 drop  1 drop Both Eyes QHS Sreenath, Sudheer B, MD      . Derrill Memo ON 08/04/2019] levothyroxine (SYNTHROID) tablet 100 mcg  100 mcg Oral QAC breakfast Sreenath, Sudheer B, MD      . losartan (COZAAR) tablet 100 mg  100 mg Oral QHS Sreenath, Sudheer B, MD      . mirtazapine (REMERON) tablet 15 mg  15 mg Oral QHS Sreenath, Sudheer B, MD      . nitroGLYCERIN (NITROSTAT) SL tablet 0.4 mg  0.4 mg Sublingual Q5 Min x 3 PRN Sreenath, Sudheer B, MD      . ondansetron (ZOFRAN-ODT) disintegrating tablet 4 mg  4 mg Oral Q8H PRN Sreenath, Sudheer B, MD      . oxybutynin (DITROPAN-XL) 24 hr tablet 10 mg  10 mg Oral QHS Sreenath, Sudheer B, MD      . pantoprazole (PROTONIX) EC tablet 40 mg  40 mg Oral BID Sreenath, Sudheer B, MD      . PARoxetine (PAXIL) tablet 10 mg  10 mg Oral QPC supper Sreenath, Sudheer B, MD      . sodium chloride flush (NS) 0.9 % injection 10 mL  10 mL Intracatheter PRN Lloyd Huger, MD      . sodium chloride flush (NS) 0.9 % injection 10 mL  10 mL Intracatheter PRN Lloyd Huger, MD      . sodium chloride flush (NS) 0.9 % injection 3 mL  3 mL Intracatheter PRN Lloyd Huger, MD      . sodium chloride flush (NS) 0.9 % injection 3 mL  3 mL Intracatheter PRN Grayland Ormond, Kathlene November, MD      . thiamine tablet 100 mg  100 mg Oral Daily Sreenath, Sudheer B, MD       . traZODone (DESYREL) tablet 50 mg  50 mg Oral QHS PRN Priscella Mann, Sudheer B, MD        VITAL SIGNS: BP (!) 144/66 (BP Location: Right Arm)   Pulse 71   Temp 98.1 F (36.7 C) (Oral)   Resp (!) 45   Ht '5\' 2"'  (1.575 m)   Wt 134 lb (60.8 kg)   SpO2 95%   BMI 24.51 kg/m  Filed Weights   07/20/2019 0947  Weight: 134 lb (60.8 kg)    Estimated body mass index is 24.51 kg/m as calculated from the following:   Height as of this encounter: '5\' 2"'  (1.575 m).   Weight as of this encounter: 134 lb (60.8 kg).  LABS: CBC:    Component Value Date/Time   WBC 8.5 07/30/2019 0953   HGB 7.4 (L) 07/22/2019 0953   HGB 11.7 12/06/2015 1035   HCT 23.5 (L) 07/15/2019 0953   HCT 36.9 12/06/2015 1035   PLT 77 (L)  0953   PLT 122 (L) 12/06/2015 1035   MCV 91.4 08/02/2019 0953   MCV 95 12/06/2015 1035   MCV 89 07/18/2014 1205   NEUTROABS 3.0 07/16/2019 0953   NEUTROABS 1 03/02/2014 0000   NEUTROABS 0.6 (L) 09/02/2013 1028   LYMPHSABS 1.4 07/16/2019 0953   LYMPHSABS 0.8 (L) 09/02/2013 1028   MONOABS 2.0 (H) 07/11/2019 0953   MONOABS 1.1 (H) 09/02/2013 1028   EOSABS 0.0 07/23/2019 0953   EOSABS 0.0 09/02/2013 1028   BASOSABS 0.3 (H) 07/21/2019 0953   BASOSABS 0.0 09/02/2013 1028   Comprehensive Metabolic Panel:    Component Value Date/Time   NA 138  0953   NA 143 12/06/2015 1035   NA  135 (L) 07/18/2014 1205   K 3.5 07/13/2019 0953   K 3.4 (L) 07/18/2014 1205   CL 103 07/11/2019 0953   CL 101 07/18/2014 1205   CO2 23 07/29/2019 0953   CO2 24 07/18/2014 1205   BUN 23 07/12/2019 0953   BUN 12 12/06/2015 1035   BUN 10 07/18/2014 1205   CREATININE 0.80 07/14/2019 0953   CREATININE 0.78 11/26/2017 1017   GLUCOSE 114 (H) 07/29/2019 0953   GLUCOSE 82 07/18/2014 1205   CALCIUM 8.8 (L) 07/21/2019 0953   CALCIUM 9.1 07/18/2014 1205   AST 22 07/30/2019 0947   AST 44 (H) 07/18/2014 1205   ALT 14 07/30/2019 0947   ALT 38 07/18/2014 1205   ALKPHOS 85 07/30/2019 0947    ALKPHOS 65 07/18/2014 1205   BILITOT 1.2 07/30/2019 0947   BILITOT 0.5 07/18/2014 1205   PROT 7.7 07/30/2019 0947   PROT 8.1 07/18/2014 1205   ALBUMIN 3.5 07/30/2019 0947   ALBUMIN 3.7 07/18/2014 1205    RADIOGRAPHIC STUDIES: CT Chest W Contrast  Result Date: 07/22/2019 CLINICAL DATA:  History of lung cancer.  Restaging. EXAM: CT CHEST, ABDOMEN, AND PELVIS WITH CONTRAST TECHNIQUE: Multidetector CT imaging of the chest, abdomen and pelvis was performed following the standard protocol during bolus administration of intravenous contrast. CONTRAST:  171m OMNIPAQUE IOHEXOL 300 MG/ML  SOLN COMPARISON:  01/13/2019 FINDINGS: CT CHEST FINDINGS Cardiovascular: Cardiac enlargement. The small to moderate posterior pericardial effusion identified, image 111/6. Aortic atherosclerosis. Lad and RCA coronary artery calcifications. Mediastinum/Nodes: Thyroid gland not visualized and may be surgically absent The trachea appears patent and is midline. Normal appearance of the esophagus. No mediastinal, hilar, supraclavicular or axillary adenopathy. Lungs/Pleura: Paraseptal and centrilobular emphysema. No pleural effusion. Bandlike area of fibrosis and masslike architectural distortion within the perihilar left lung is again identified compatible with changes due to external beam radiation. The appearance is unchanged compared with the previous exam. No suspicious pulmonary nodule or mass identified at this time. Musculoskeletal: Healing left lateral rib deformities. Multi level degenerative disc disease identified throughout the thoracic spine. CT ABDOMEN PELVIS FINDINGS Hepatobiliary: No focal liver abnormality. Gallbladder is unremarkable. No biliary ductal dilatation. Pancreas: Similar appearance of mild increase caliber of the main duct. No inflammation. No mass Spleen: The spleen measures 9.7 x 6.3 by 15.6 cm (volume = 500 cm^3). New linear, bandlike area of hypoenhancement within the superior aspect of the spleen is  identified, image 66/4 and is concerning for a area of splenic laceration. Adrenals/Urinary Tract: Normal appearance of the adrenal glands. Right kidney cyst measures 1.7 cm. No mass or hydronephrosis. The urinary bladder is unremarkable. Stomach/Bowel: Stomach is within normal limits. Appendix appears normal. No evidence of bowel wall thickening, distention, or inflammatory changes. Vascular/Lymphatic: Aortic atherosclerosis. No aneurysm. No abdominopelvic adenopathy identified. Reproductive: Status post hysterectomy. No adnexal masses. Other: There is a small to moderate volume of free fluid within the abdomen and pelvis. No discrete fluid collections. Musculoskeletal: Scoliosis and degenerative disc disease. No aggressive lytic or sclerotic bone lesions identified the at this time. IMPRESSION: 1. Stable changes of external beam radiation within the left lung without findings to suggest Lung cancer recurrence within the chest. 2. Significant increase in volume of the spleen. New bandlike area of low attenuation within the superior pole of spleen concerning for with age indeterminate splenic laceration. Alternatively, findings may reflect splenic infarct. 3. New small to moderate volume of free fluid within the abdomen and pelvis. Etiology is indeterminate but in the setting  of splenic laceration could represent resolving hemoperitoneum. 4. Small pericardial effusion, new from previous study. 5. Healing left rib fractures. 6. Aortic Atherosclerosis (ICD10-I70.0) and Emphysema (ICD10-J43.9). Electronically Signed   By: Kerby Moors M.D.   On: 07/22/2019 10:40   CT Angio Chest PE W and/or Wo Contrast  Result Date:  CLINICAL DATA:  Shortness of breath EXAM: CT ANGIOGRAPHY CHEST WITH CONTRAST TECHNIQUE: Multidetector CT imaging of the chest was performed using the standard protocol during bolus administration of intravenous contrast. Multiplanar CT image reconstructions and MIPs were obtained to  evaluate the vascular anatomy. CONTRAST:  81m OMNIPAQUE IOHEXOL 350 MG/ML SOLN COMPARISON:  07/22/2019 FINDINGS: Cardiovascular: Cardiomegaly with persistent small pericardial effusion. Pulmonary vasculature is adequately opacified. No filling defect to the segmental branch pulmonary arteries to suggest pulmonary embolism. Right-sided chest port and left-sided implanted cardiac device remain unchanged in positioning. Atherosclerotic calcification of the aorta and coronary arteries. Mediastinum/Nodes: No axillary, mediastinal, or hilar lymphadenopathy. Trachea and esophagus within normal limits. Lungs/Pleura: Post radiation changes with fibrosis and architectural distortion is again identified in the left perihilar region. Moderate emphysema. No focal airspace consolidation, pleural effusion, or pneumothorax. Upper Abdomen: Low-density area within the posterior aspect of the superior spleen. There also appears to be small amount of fluid around the spleen. No perihepatic ascites is evident. Musculoskeletal: Similar degenerative changes throughout the thoracic spine. Review of the MIP images confirms the above findings. IMPRESSION: 1. Negative for pulmonary embolus. 2. Stable post radiation changes in the left perihilar region. 3. Cardiomegaly with persistent small pericardial effusion. 4. Low-density area within the posterior aspect of the superior spleen, may represent a splenic infarct. Small amount of fluid around the spleen, which appears new from prior. 5. Emphysema and aortic atherosclerosis. Aortic Atherosclerosis (ICD10-I70.0) and Emphysema (ICD10-J43.9). Electronically Signed   By: NDavina PokeD.O.   On: 08/02/2019 12:43   CT Abdomen Pelvis W Contrast  Result Date: 07/22/2019 CLINICAL DATA:  History of lung cancer.  Restaging. EXAM: CT CHEST, ABDOMEN, AND PELVIS WITH CONTRAST TECHNIQUE: Multidetector CT imaging of the chest, abdomen and pelvis was performed following the standard protocol during  bolus administration of intravenous contrast. CONTRAST:  1054mOMNIPAQUE IOHEXOL 300 MG/ML  SOLN COMPARISON:  01/13/2019 FINDINGS: CT CHEST FINDINGS Cardiovascular: Cardiac enlargement. The small to moderate posterior pericardial effusion identified, image 111/6. Aortic atherosclerosis. Lad and RCA coronary artery calcifications. Mediastinum/Nodes: Thyroid gland not visualized and may be surgically absent The trachea appears patent and is midline. Normal appearance of the esophagus. No mediastinal, hilar, supraclavicular or axillary adenopathy. Lungs/Pleura: Paraseptal and centrilobular emphysema. No pleural effusion. Bandlike area of fibrosis and masslike architectural distortion within the perihilar left lung is again identified compatible with changes due to external beam radiation. The appearance is unchanged compared with the previous exam. No suspicious pulmonary nodule or mass identified at this time. Musculoskeletal: Healing left lateral rib deformities. Multi level degenerative disc disease identified throughout the thoracic spine. CT ABDOMEN PELVIS FINDINGS Hepatobiliary: No focal liver abnormality. Gallbladder is unremarkable. No biliary ductal dilatation. Pancreas: Similar appearance of mild increase caliber of the main duct. No inflammation. No mass Spleen: The spleen measures 9.7 x 6.3 by 15.6 cm (volume = 500 cm^3). New linear, bandlike area of hypoenhancement within the superior aspect of the spleen is identified, image 66/4 and is concerning for a area of splenic laceration. Adrenals/Urinary Tract: Normal appearance of the adrenal glands. Right kidney cyst measures 1.7 cm. No mass or hydronephrosis. The urinary bladder is unremarkable. Stomach/Bowel: Stomach is  within normal limits. Appendix appears normal. No evidence of bowel wall thickening, distention, or inflammatory changes. Vascular/Lymphatic: Aortic atherosclerosis. No aneurysm. No abdominopelvic adenopathy identified. Reproductive: Status  post hysterectomy. No adnexal masses. Other: There is a small to moderate volume of free fluid within the abdomen and pelvis. No discrete fluid collections. Musculoskeletal: Scoliosis and degenerative disc disease. No aggressive lytic or sclerotic bone lesions identified the at this time. IMPRESSION: 1. Stable changes of external beam radiation within the left lung without findings to suggest Lung cancer recurrence within the chest. 2. Significant increase in volume of the spleen. New bandlike area of low attenuation within the superior pole of spleen concerning for with age indeterminate splenic laceration. Alternatively, findings may reflect splenic infarct. 3. New small to moderate volume of free fluid within the abdomen and pelvis. Etiology is indeterminate but in the setting of splenic laceration could represent resolving hemoperitoneum. 4. Small pericardial effusion, new from previous study. 5. Healing left rib fractures. 6. Aortic Atherosclerosis (ICD10-I70.0) and Emphysema (ICD10-J43.9). Electronically Signed   By: Kerby Moors M.D.   On: 07/22/2019 10:40   DG Chest Port 1 View  Result Date:  CLINICAL DATA:  Dyspnea. EXAM: PORTABLE CHEST 1 VIEW COMPARISON:  July 30, 2019. FINDINGS: Stable cardiomegaly. No pneumothorax or significant pleural effusion is noted. Right internal jugular Port-A-Cath is unchanged in position. Left-sided pacemaker is unchanged. Lungs are clear. Old left rib fractures are noted. IMPRESSION: No active disease. Electronically Signed   By: Marijo Conception M.D.   On: 07/15/2019 10:07   DG Chest Portable 1 View  Result Date: 07/30/2019 CLINICAL DATA:  Weakness and diarrhea. History of lung carcinoma and renal carcinoma. EXAM: PORTABLE CHEST 1 VIEW COMPARISON:  CT of the chest on 07/22/2019 and prior chest x-ray on 06/18/2019 FINDINGS: Stable cardiac enlargement and appearance of dual-chamber pacemaker. Stable appearance of Port-A-Cath. Stable chronic atelectasis  and volume loss at the left lung base. There is no evidence of pulmonary edema, consolidation, pneumothorax, nodule or pleural fluid. The visualized skeletal structures are unremarkable. IMPRESSION: Stable chest x-ray demonstrating stable cardiomegaly and chronic atelectasis at the left lung base. Electronically Signed   By: Aletta Edouard M.D.   On: 07/30/2019 13:40   CT BONE MARROW BIOPSY & ASPIRATION  Result Date: 07/05/2019 INDICATION: Anemia. EXAM: CT-DIRECTED BONE MARROW BIOPSY AND ASPIRATION. MEDICATIONS: None. ANESTHESIA/SEDATION: Moderate (conscious) sedation was employed during this procedure. A total of Versed 4.0 mg Moderate Sedation Time: 35 minutes. The patient's level of consciousness and vital signs were monitored continuously by radiology nursing throughout the procedure under my direct supervision. FLUOROSCOPY TIME:  Fluoroscopy Time: 0 minutes 0 seconds COMPLICATIONS: None immediate. PROCEDURE: After discussing risks and benefits of this procedure with the patient informed consent was obtained. Back was sterilely prepped and draped. Following local anesthesia with 1% lidocaine and IV conscious sedation with 4.0 mg of Versed. Standard bone marrow biopsy and aspiration was performed. Two core samples obtained. Aspirate and core samples sent to lab for further evaluation. IMPRESSION: Successful CT-directed bone marrow biopsy and aspiration. Electronically Signed   By: [place]   On: 07/05/2019 10:13    PERFORMANCE STATUS (ECOG) : 3 - Symptomatic, >50% confined to bed  Review of Systems Unless otherwise noted, a complete review of systems is negative.  Physical Exam General: NAD, frail appearing, thin Pulmonary: Unlabored Extremities: no edema, no joint deformities Skin: no rashes Neurological: Weakness but otherwise nonfocal  IMPRESSION: I met with patient in the ER to discuss goals.  Introduced palliative care services and attempted establish therapeutic  rapport.  Patient seems to have a reasonable understanding of her current health problems and prognosis.  She reports feeling generally miserable.  She is in agreement with short-term hospitalization and the current scope of treatment.   Patient says that at baseline, she lives in an apartment where she still maintains her independence.  However, she is become progressively weak over the past several weeks.  She has 4 daughters who are involved in her care.  Patient is transfusion dependent.  She says that she feels better for a brief amount of time following transfusions but then has subsequent decline.    We discussed her goals for chronic care including options to transition to a more comfort based approach that might include hospice.  Patient says that she would want to speak with her daughters before making any decisions. I reiterated that no decisions have to be made at the present time.  I offered to call her daughters but patient asked me not to bother them.  Patient thinks that she has advanced directives including a healthcare power of attorney and living will.  We do not have anything on file in the EMR.  Patient says that she would likely prefer a "natural death" and would not want heroics at end-of-life.  However, she again said that she would want to speak with her daughters prior to any decision making.  Case discussed with Dr. Grayland Ormond.   PLAN: -Continue current scope of treatment -Will follow   Time Total: 60 minutes  Visit consisted of counseling and education dealing with the complex and emotionally intense issues of symptom management and palliative care in the setting of serious and potentially life-threatening illness.Greater than 50%  of this time was spent counseling and coordinating care related to the above assessment and plan.  Signed by: Altha Harm, PhD, NP-C

## 2019-08-03 NOTE — Progress Notes (Signed)
PT Cancellation Note  Patient Details Name: Caroline Nichols MRN: [id number] DOB: 1940-09-01   Cancelled Treatment:    Reason Eval/Treat Not Completed: Medical issues which prohibited therapy; Per nursing pt's most recent resting RR of 49 is accurate with nursing stating pt is preparing for transfer to the floor.  Will hold PT eval at this time secondary to elevated resting RR and general dyspnea per nursing.  Will attempt to see pt at a future date/time as medically appropriate.      Linus Salmons PT, DPT 08/02/2019, 3:57 PM

## 2019-08-03 NOTE — ED Notes (Signed)
Pt appears down and saddened.  Offered to have chaplain come and visit with her. Chaplain paged to come and pray with pt.

## 2019-08-03 NOTE — ED Notes (Signed)
Consent for blood documented in computer consent.  Pt remains alert and oriented. Tachypnea still present.  sats WNL.

## 2019-08-03 NOTE — ED Notes (Signed)
Called and updated daughter Caroline Nichols while in room.  Placed call on speaker phone and pt spoke with daughter as well.

## 2019-08-03 NOTE — ED Notes (Signed)
Patient transported to CT 

## 2019-08-03 NOTE — ED Provider Notes (Signed)
[facility] Emergency Department Provider Note       Time seen: ----------------------------------------- 9:47 AM on 07/18/2019 -----------------------------------------   I have reviewed the triage vital signs and the nursing notes.  HISTORY   Chief Complaint Weakness    HPI Caroline Nichols is a 78 y.o. female with a history of anemia, anxiety, arthritis, cellulitis, diabetes insipidus, GERD, anemia, hyperlipidemia, hypertension, lung cancer, paroxysmal A. fib who presents to the ED for weakness and diarrhea for the past 2 weeks.  Patient appeared pale on arrival, blood glucose was normal.  She arrives alert and oriented and tachypneic but states she is not having trouble breathing.  She denies any blood in her stools.  Was seen here for 5 days ago for same.  Past Medical History:  Diagnosis Date  . Anemia 02/2013   F/U with Dr. Grayland Ormond for Feraheme injections  . Anxiety   . Arthritis    Possible RA - Follow up appt with Dr. Jefm Bryant  . Cellulitis of arm, right   . Chest pain 03/26/2013  . Diabetes insipidus ([place])    On desmopressin  . GERD (gastroesophageal reflux disease)   . H/O seasonal allergies   . H/O transfusion of packed red blood cells 02/2013   3 units PRBC for severe anemia 02/2013  . Herniated disc   . History of kidney stones   . Hyperlipidemia   . Hypertension   . Hyperthyroidism    s/p radiation therapy, now hypothyroidism  . IBS (irritable bowel syndrome)   . Lung cancer ([place]) 06/2017   left  . Migraine   . Pacemaker    a. s/p successful implantation of a Medtronic CapSureFix Novus MRIT SureScan serial # [id number] on 06/28/16 by Dr. Caryl Comes for sinus node dysfunction.  . Pancreatic cyst   . Paroxysmal A-fib ([place])   . Renal cell cancer, right ([place]) 10/23/2015   Right partial nephrectomy.    Patient Active Problem List   Diagnosis Date Noted  . Rheumatoid arthritis involving multiple sites, unspecified whether rheumatoid  factor present ([place]) 07/18/2019  . Heart failure, unspecified HF chronicity, unspecified heart failure type ([place]) 07/18/2019  . Type 2 diabetes mellitus with stage 3a chronic kidney disease, without long-term current use of insulin ([place]) 07/18/2019  . Diabetes insipidus ([place]) 07/18/2019  . Coronary artery disease of native artery of native heart with stable angina pectoris ([place]) 07/18/2019  . MDS (myelodysplastic syndrome) ([place]) 07/11/2019  . Insomnia due to medical condition 06/23/2019  . Educated about COVID-19 virus infection 12/20/2018  . GERD (gastroesophageal reflux disease) 02/20/2018  . Night sweats 01/11/2018  . Diarrhea in adult patient 01/11/2018  . Chronic respiratory failure ([place]) 01/01/2018  . Statin intolerance 10/16/2017  . B12 deficiency 10/16/2017  . Concussion 07/20/2017  . Malignant neoplasm of lung, unspecified laterality, unspecified part of lung ([place]) 07/16/2017  . Special screening for malignant neoplasms, colon 09/11/2016  . Encounter for Medicare annual wellness exam 07/21/2016  . Vitamin D deficiency 07/21/2016  . Pacemaker   . Sinus node dysfunction ([place]) 05/23/2016  . History of atrial fibrillation 10/28/2015  . History of renal cell carcinoma 10/23/2015  . Goals of care, counseling/discussion 10/12/2015  . Urinary frequency 07/28/2015  . Inflammatory arthritis 05/10/2015  . Nocturia 04/16/2015  . Hyperlipidemia 12/17/2014  . Major depressive disorder with current active episode 12/14/2014  . Xerostomia 11/14/2014  . S/P hysterectomy 06/09/2014  . Atherosclerotic peripheral vascular disease with intermittent claudication ([place]) 10/07/2013  . COPD with chronic bronchitis and  emphysema ([place]) 08/03/2013  . Left carotid bruit 03/26/2013  . Acquired pancytopenia ([place]) 03/20/2013  . Bradycardia 12/09/2012  . Cervical vertebral fusion 12/09/2012  . Neuropathy 12/09/2012  . History of tobacco abuse 12/09/2012  . IBS (irritable bowel syndrome)   .  Degenerative arthritis of lumbar spine   . Mild mitral regurgitation 09/27/2009  . Postablative hypothyroidism 05/15/2009  . Anxiety state 05/15/2009  . Essential hypertension 05/15/2009    Past Surgical History:  Procedure Laterality Date  . ABDOMINAL HYSTERECTOMY    . BREAST EXCISIONAL BIOPSY Right 2005   neg  . BREAST SURGERY  1990s   Biopsy  . CERVICAL SPINE SURGERY     Bone fusion  . COLONOSCOPY  2008?   [place]  . COLONOSCOPY WITH PROPOFOL N/A 10/15/2016   Procedure: COLONOSCOPY WITH PROPOFOL;  Surgeon: Robert Bellow, MD;  Location: [facility]COPY;  Service: Endoscopy;  Laterality: N/A;  . EP IMPLANTABLE DEVICE N/A 06/28/2016   Procedure: Pacemaker Implant;  Surgeon: Deboraha Sprang, MD;  Location: [place] CV LAB;  Service: Cardiovascular;  Laterality: N/A;  . HAMMER TOE SURGERY    . HERNIA REPAIR Left    Inguinal Hernia Repair  . PORTA CATH INSERTION N/A 07/24/2017   Procedure: PORTA CATH INSERTION;  Surgeon: Algernon Huxley, MD;  Location: [place] [facility] LAB;  Service: Cardiovascular;  Laterality: N/A;  . ROBOTIC ASSITED PARTIAL NEPHRECTOMY Right 10/23/2015   Procedure: ROBOTIC ASSITED PARTIAL NEPHRECTOMY with intraop ultrasound;  Surgeon: Hollice Espy, MD;  Location: [facility] ORS;  Service: Urology;  Laterality: Right;  . TUBAL LIGATION      Allergies Metrizamide, Adhesive [tape], Augmentin [amoxicillin-pot clavulanate], Codeine, Morphine and related, Other, Tetanus toxoid, and Tramadol  Social History Social History   Tobacco Use  . Smoking status: Former Smoker    Packs/day: 0.50    Years: 30.00    Pack years: 15.00    Types: Cigarettes    Quit date: 12/03/2012    Years since quitting: 6.6  . Smokeless tobacco: Never Used  Substance Use Topics  . Alcohol use: No    Alcohol/week: 0.0 standard drinks  . Drug use: No   Review of Systems Constitutional: Negative for fever. Cardiovascular: Negative for chest pain. Respiratory: Negative for shortness  of breath. Gastrointestinal: Negative for abdominal pain, positive for diarrhea Musculoskeletal: Negative for back pain. Skin: Negative for rash. Neurological: Positive for generalized weakness  All systems negative/normal/unremarkable except as stated in the HPI  ____________________________________________   PHYSICAL EXAM:  VITAL SIGNS: ED Triage Vitals [07/29/2019 0947]  Enc Vitals Group     BP      Pulse      Resp      Temp      Temp src      SpO2      Weight      Height      Head Circumference      Peak Flow      Pain Score 0     Pain Loc      Pain Edu?      Excl. in [place]?    Constitutional: Alert and oriented.  Mild to moderate distress Eyes: Conjunctivae are somewhat pale. Normal extraocular movements. ENT      Head: Normocephalic and atraumatic.      Nose: No congestion/rhinnorhea.      Mouth/Throat: Mucous membranes are moist.      Neck: No stridor. Cardiovascular: Normal rate, regular rhythm. No murmurs, rubs, or gallops. Respiratory: Tachypnea  with clear breath sounds Gastrointestinal: Soft and nontender. Normal bowel sounds Musculoskeletal: Nontender with normal range of motion in extremities. No lower extremity tenderness nor edema. Neurologic:  Normal speech and language. No gross focal neurologic deficits are appreciated.  Skin:  Skin is warm, dry and intact. No rash noted. Psychiatric: Mood and affect are normal. Speech and behavior are normal.  ____________________________________________  EKG: Interpreted by me.  Sinus rhythm with rate of 95 bpm, PVC, normal axis, borderline long QT  ____________________________________________  ED COURSE:  As part of my medical decision making, I reviewed the following data within the [place] History obtained from family if available, nursing notes, old chart and ekg, as well as notes from prior ED visits. Patient presented for weakness, diarrhea and dyspnea, we will assess with labs and imaging  as indicated at this time.   Procedures  Caroline Nichols was evaluated in Emergency Department on 07/21/2019 for the symptoms described in the history of present illness. She was evaluated in the context of the global COVID-19 pandemic, which necessitated consideration that the patient might be at risk for infection with the SARS-CoV-2 virus that causes COVID-19. Institutional protocols and algorithms that pertain to the evaluation of patients at risk for COVID-19 are in a state of rapid change based on information released by regulatory bodies including the CDC and federal and state organizations. These policies and algorithms were followed during the patient's care in the ED.  ____________________________________________   LABS (pertinent positives/negatives)  Labs Reviewed  CBC WITH DIFFERENTIAL/PLATELET - Abnormal; Notable for the following components:      Result Value   RBC 2.57 (*)    Hemoglobin 7.4 (*)    HCT 23.5 (*)    RDW 16.7 (*)    Platelets 77 (*)    nRBC 1.6 (*)    Monocytes Absolute 2.0 (*)    Basophils Absolute 0.3 (*)    Abs Immature Granulocytes 1.73 (*)    All other components within normal limits  BASIC METABOLIC PANEL - Abnormal; Notable for the following components:   Glucose, Bld 114 (*)    Calcium 8.8 (*)    All other components within normal limits  BRAIN NATRIURETIC PEPTIDE - Abnormal; Notable for the following components:   B Natriuretic Peptide 674.0 (*)    All other components within normal limits  URINALYSIS, COMPLETE (UACMP) WITH MICROSCOPIC - Abnormal; Notable for the following components:   Color, Urine YELLOW (*)    APPearance CLEAR (*)    Protein, ur 100 (*)    All other components within normal limits  TROPONIN I (HIGH SENSITIVITY) - Abnormal; Notable for the following components:   Troponin I (High Sensitivity) 152 (*)    All other components within normal limits  RESPIRATORY PANEL BY RT PCR (FLU A&B, COVID)  C DIFFICILE QUICK SCREEN W PCR  REFLEX  GI PATHOGEN PANEL BY PCR, STOOL  TYPE AND SCREEN  PREPARE RBC (CROSSMATCH)   CRITICAL CARE Performed by: Laurence Aly   Total critical care time: 30 minutes  Critical care time was exclusive of separately billable procedures and treating other patients.  Critical care was necessary to treat or prevent imminent or life-threatening deterioration.  Critical care was time spent personally by me on the following activities: development of treatment plan with patient and/or surrogate as well as nursing, discussions with consultants, evaluation of patient's response to treatment, examination of patient, obtaining history from patient or surrogate, ordering and performing treatments and interventions,  ordering and review of laboratory studies, ordering and review of radiographic studies, pulse oximetry and re-evaluation of patient's condition.  RADIOLOGY Images were viewed by me  Chest x-ray/CTA chest  IMPRESSION:  1. Negative for pulmonary embolus.  2. Stable post radiation changes in the left perihilar region.  3. Cardiomegaly with persistent small pericardial effusion.  4. Low-density area within the posterior aspect of the superior  spleen, may represent a splenic infarct. Small amount of fluid  around the spleen, which appears new from prior.  5. Emphysema and aortic atherosclerosis.   Aortic Atherosclerosis (ICD10-I70.0) and Emphysema (ICD10-J43.9).  ____________________________________________   DIFFERENTIAL DIAGNOSIS   Dehydration, electrolyte abnormality, anemia, occult GI bleeding, anxiety, sepsis, COVID-19  FINAL ASSESSMENT AND PLAN  Weakness, diarrhea, anemia, elevated troponin   Plan: The patient had presented for weakness and diarrhea. Patient's labs likely indicated anemia with demand ischemia.  Hemoglobin was 8.44 days ago.  Patient's imaging did not reveal any acute process.  Patient still seems dyspneic so we ordered blood, 1 unit packed red blood  cells.  CT scan look surprisingly unremarkable.  Overall she looks unwell, and this may be a chronic progression due to her myelodysplasia.  I will discuss with the hospitalist for admission.   Laurence Aly, MD    Note: This note was generated in part or whole with voice recognition software. Voice recognition is usually quite accurate but there are transcription errors that can and very often do occur. I apologize for any typographical errors that were not detected and corrected.     Earleen Newport, MD 07/29/2019 1254

## 2019-08-03 NOTE — ED Notes (Addendum)
Updated daughter wendy and informed about admission.  Pt keeps asking about her glasses. Do not remember glasses on pt on arrival.  Spoke with daughter and she reports PCP office pt was sent from denies her having glasses there.  [name] will check with [facility].

## 2019-08-03 NOTE — Progress Notes (Signed)
Rapid Response called on patient due to tachypnea, diaphoresis, and decreased heart rate.  The response team checked vitals and ordered a chest xray, and post transfusion H&H. Patient is alert to self but very drowsy. Will continue to monitor.  Caroline Nichols

## 2019-08-03 NOTE — H&P (Signed)
History and Physical    Caroline Nichols HFW:[id number] DOB: 1940-10-10 DOA: 07/15/2019  PCP: Caroline Mc, MD  Patient coming from: Home  I have personally briefly reviewed patient's old medical records in Caroline Nichols  Chief Complaint: Weakness, fatigue, poor p.o. intake, diarrhea  HPI: Caroline Nichols is a 78 y.o. female with medical history significant of chronic anemia, anxiety, rheumatoid arthritis, diabetes insipidus, GERD, splenic laceration, hyperlipidemia, hypertension.  Pain also had a history of 2 separate primary cancers including high risk myelodysplastic syndrome not amenable to chemotherapy currently as well as stage IIb large cell neuroendocrine tumor for which she is complete of 3 cycles of combination chemotherapy and XRT.  Treatment for the lung cancer was discontinued due to persistent pancytopenia.  Patient presented to the hospital today with chief complaint of worsening fatigue, dyspnea, diarrhea, poor p.o. intake.  She was recently seen in the emergency department on 07/30/2019 and discharged home at that time.  She returns with worsening of the same chronic symptoms.  Patient is known to Dr. Grayland Nichols and was recently seen in his office on 07/28/2019.  Patient denies overt shortness of breath however is significantly dyspneic on exam.  She also endorses severe diarrhea and her hemoglobin has dropped a point in the last 4 days.  She denies any black or bloody stools.  Of note patient had a CT abdomen pelvis recently demonstrated a splenic laceration of unclear etiology.  Patient was recommended to double the dose of her p.o. PPI which she has done and follow-up outpatient with gastroenterology.   ED Course: Given patient's significant dyspnea on exam a CT thorax for pulmonary embolism was done.  This was negative for acute clot.  Vital signs are stable and the patient was afebrile however considering significant diarrhea and dyspnea a C. difficile and GI PCR panel was sent.   Considering the drop in hemoglobin and a unit of blood was administered.  The patient does report symptomatic improvement since the transfusion.  At time my evaluation patient is somewhat tachypneic breathing approximately 26 times a minute.  Remainder vital signs within normal limits.  Given her diarrhea suspicious for infectious origin as well symptomatic anemia and general deconditioning she will be admitted to the medicine service under inpatient status.  Review of Systems: As per HPI otherwise 10 point review of systems negative.    Past Medical History:  Diagnosis Date  . Anemia 02/2013   F/U with Dr. Grayland Nichols for Feraheme injections  . Anxiety   . Arthritis    Possible RA - Follow up appt with Dr. Jefm Bryant  . Cellulitis of arm, right   . Chest pain 03/26/2013  . Diabetes insipidus ([place])    On desmopressin  . GERD (gastroesophageal reflux disease)   . H/O seasonal allergies   . H/O transfusion of packed red blood cells 02/2013   3 units PRBC for severe anemia 02/2013  . Herniated disc   . History of kidney stones   . Hyperlipidemia   . Hypertension   . Hyperthyroidism    s/p radiation therapy, now hypothyroidism  . IBS (irritable bowel syndrome)   . Lung cancer ([place]) 06/2017   left  . Migraine   . Pacemaker    a. s/p successful implantation of a Medtronic CapSureFix Novus MRIT SureScan serial # [id number] on 06/28/16 by Dr. Caryl Comes for sinus node dysfunction.  . Pancreatic cyst   . Paroxysmal A-fib ([place])   . Renal cell cancer, right ([place]) 10/23/2015  Right partial nephrectomy.    Past Surgical History:  Procedure Laterality Date  . ABDOMINAL HYSTERECTOMY    . BREAST EXCISIONAL BIOPSY Right 2005   neg  . BREAST SURGERY  1990s   Biopsy  . CERVICAL SPINE SURGERY     Bone fusion  . COLONOSCOPY  2008?   [place]  . COLONOSCOPY WITH PROPOFOL N/A 10/15/2016   Procedure: COLONOSCOPY WITH PROPOFOL;  Surgeon: Robert Bellow, MD;  Location: [facility] ENDOSCOPY;  Service:  Endoscopy;  Laterality: N/A;  . EP IMPLANTABLE DEVICE N/A 06/28/2016   Procedure: Pacemaker Implant;  Surgeon: Deboraha Sprang, MD;  Location: [place] CV LAB;  Service: Cardiovascular;  Laterality: N/A;  . HAMMER TOE SURGERY    . HERNIA REPAIR Left    Inguinal Hernia Repair  . PORTA CATH INSERTION N/A 07/24/2017   Procedure: PORTA CATH INSERTION;  Surgeon: Algernon Huxley, MD;  Location: [place] [facility] LAB;  Service: Cardiovascular;  Laterality: N/A;  . ROBOTIC ASSITED PARTIAL NEPHRECTOMY Right 10/23/2015   Procedure: ROBOTIC ASSITED PARTIAL NEPHRECTOMY with intraop ultrasound;  Surgeon: Hollice Espy, MD;  Location: [facility] ORS;  Service: Urology;  Laterality: Right;  . TUBAL LIGATION       reports that she quit smoking about 6 years ago. Her smoking use included cigarettes. She has a 15.00 pack-year smoking history. She has never used smokeless tobacco. She reports that she does not drink alcohol or use drugs.  Allergies  Allergen Reactions  . Metrizamide     Other reaction(s): Other (See Comments)  . Adhesive [Tape] Other (See Comments)    "irritate skin"  . Augmentin [Amoxicillin-Pot Clavulanate] Diarrhea    Severe diarrhea  . Codeine Nausea Only  . Morphine And Related Other (See Comments)    Shut down her organs  . Other     Skin irritation  . Tetanus Toxoid Other (See Comments)    REACTION: ARM SWELLING,REDNESS  . Tramadol Nausea And Vomiting    Family History  Problem Relation Age of Onset  . Heart disease Mother   . Heart disease Father   . Diabetes Brother   . Kidney disease Brother   . Stroke Daughter        due to medication reaction  . Breast cancer Cousin   . Prostate cancer Neg Hx   . Hematuria Neg Hx     Prior to Admission medications   Medication Sig Start Date End Date Taking? Authorizing Provider  acetaminophen (TYLENOL) 325 MG tablet Take 2 tablets (650 mg total) by mouth every 6 (six) hours as needed for pain. Patient taking differently: Take 650  mg by mouth every 6 (six) hours as needed for pain.  01/04/13  Yes Hongalgi, Lenis Dickinson, MD  albuterol (PROVENTIL HFA;VENTOLIN HFA) 108 (90 Base) MCG/ACT inhaler Inhale 2 puffs into the lungs every 6 (six) hours as needed for wheezing or shortness of breath. 12/23/17  Yes Fritzi Mandes, MD  ALPRAZolam (XANAX) 0.25 MG tablet TAKE ONE TABLET BY MOUTH TWICE A DAY AS NEEDED FOR ANXIETY Patient taking differently: Take 0.25 mg by mouth 2 (two) times daily as needed for anxiety. TAKE ONE TABLET BY MOUTH TWICE A DAY AS NEEDED FOR ANXIETY 07/27/19  Yes Caroline Mc, MD  brimonidine (ALPHAGAN) 0.2 % ophthalmic solution Place 1 drop into both eyes 2 (two) times daily.    Yes [provider]  Calcium Carbonate-Vitamin D (CALCIUM-CARB 600 + D) 600-125 MG-UNIT TABS Take 2 tablets by mouth daily.   Yes  [provider]  carvedilol (COREG) 6.25 MG tablet 1.5 tablets twice daily with meals Patient taking differently: Take 9.375 mg by mouth 2 (two) times daily with a meal.  06/23/19  Yes Caroline Mc, MD  cholecalciferol (VITAMIN D) 1000 units tablet Take 1,000 Units by mouth daily.   Yes [provider]  dorzolamide (TRUSOPT) 2 % ophthalmic solution Place 1 drop into both eyes 2 (two) times daily.    Yes [provider]  ferrous sulfate 325 (65 FE) MG tablet Take 325 mg by mouth daily with breakfast.   Yes [provider]  furosemide (LASIX) 20 MG tablet TAKE ONE-HALF TABLET BY MOUTH DAILY Patient taking differently: Take 10 mg by mouth daily.  05/13/19  Yes Caroline Mc, MD  gabapentin (NEURONTIN) 300 MG capsule TAKE ONE CAPSULE BY MOUTH THREE TIMES A DAY AS NEEDED FOR SHINGLES Patient taking differently: Take 300 mg by mouth 3 (three) times daily. TAKE ONE CAPSULE BY MOUTH THREE TIMES A DAY AS NEEDED FOR SHINGLES 07/27/19  Yes Caroline Mc, MD  HYDROcodone-acetaminophen (NORCO) 10-325 MG tablet Take 1 tablet by mouth every 6 (six) hours as needed. 07/23/19  Yes Caroline Mc, MD  hydroxychloroquine (PLAQUENIL) 200 MG tablet Take 400 mg by mouth daily.  12/11/15  Yes [provider]  latanoprost (XALATAN) 0.005 % ophthalmic solution Place 1 drop into both eyes at bedtime.  01/02/16  Yes [provider]  levothyroxine (SYNTHROID) 100 MCG tablet Take 100 mcg by mouth daily before breakfast.   Yes [provider]  loperamide (IMODIUM A-D) 2 MG tablet Take 1 tablet (2 mg total) by mouth 4 (four) times daily as needed for diarrhea or loose stools. 07/30/19  Yes Nance Pear, MD  losartan (COZAAR) 100 MG tablet Take 1 tablet (100 mg total) by mouth at bedtime. 06/23/19  Yes Caroline Mc, MD  nitroGLYCERIN (NITROLINGUAL) 0.4 MG/SPRAY spray Place 1 spray under the tongue every 5 (five) minutes x 3 doses as needed for chest pain. 12/11/18  Yes Gollan, Kathlene November, MD  Omega-3 Fatty Acids (FISH OIL) 1000 MG CAPS Take 1,000 mg by mouth daily.    Yes [provider]  ondansetron (ZOFRAN ODT) 4 MG disintegrating tablet Take 1 tablet (4 mg total) by mouth every 8 (eight) hours as needed for nausea or vomiting. 07/16/19  Yes Caroline Mc, MD  oxybutynin (DITROPAN-XL) 10 MG 24 hr tablet TAKE ONE TABLET BY MOUTH AT BEDTIME Patient taking differently: Take 10 mg by mouth at bedtime.  07/27/19  Yes Caroline Mc, MD  pantoprazole (PROTONIX) 40 MG tablet Take 1 tablet (40 mg total) by mouth 2 (two) times daily. 07/28/19  Yes Lloyd Huger, MD  PARoxetine (PAXIL) 10 MG tablet TAKE ONE TABLET BY MOUTH DAILY AFTER DINNER Patient taking differently: Take 10 mg by mouth daily after supper.  03/10/19  Yes Caroline Mc, MD  traZODone (DESYREL) 50 MG tablet Take 50 mg by mouth at bedtime as needed for sleep.   Yes [provider]  vitamin C (ASCORBIC ACID) 500 MG tablet Take 500 mg by mouth daily.   Yes [provider]  levothyroxine (SYNTHROID) 112 MCG tablet Take 1 tablet (112 mcg total) by mouth every morning. Patient not  taking: Reported on 08/02/2019 06/23/19   Caroline Mc, MD  mirtazapine (REMERON) 15 MG tablet Take 1 tablet (15 mg total) by mouth at bedtime. 06/23/19   Caroline Mc, MD  MYRBETRIQ 50  MG TB24 tablet TAKE ONE TABLET BY MOUTH DAILY Patient not taking: No sig reported 01/22/19   Hollice Espy, MD  predniSONE (DELTASONE) 10 MG tablet 6 tablets daily for 3 days,  then reduce by 1 tablet daily until gone Patient not taking: Reported on 07/20/2019 07/16/19   Caroline Mc, MD    Physical Exam: Vitals:   07/25/2019 1243 07/12/2019 1300 07/12/2019 1330 07/11/2019 1400  BP: (!) 144/63 (!) 141/69 (!) 151/56 135/88  Pulse: 93 (!) 48 (!) 55 (!) 48  Resp: (!) 45 (!) 47 (!) 34 (!) 41  Temp: 98.1 F (36.7 C)     TempSrc: Oral     SpO2: 96% 97% 96% 93%  Weight:      Height:        Constitutional: NAD, calm, comfortable Vitals:   08/01/2019 1243 07/24/2019 1300 07/18/2019 1330 07/25/2019 1400  BP: (!) 144/63 (!) 141/69 (!) 151/56 135/88  Pulse: 93 (!) 48 (!) 55 (!) 48  Resp: (!) 45 (!) 47 (!) 34 (!) 41  Temp: 98.1 F (36.7 C)     TempSrc: Oral     SpO2: 96% 97% 96% 93%  Weight:      Height:       Eyes: PERRL, lids and conjunctivae normal ENMT: Mucous membranes are dry. Posterior pharynx clear of any exudate or lesions.Normal dentition.  Neck: normal, supple, no masses, no thyromegaly Respiratory: Tachypneic, mild end expiratory wheeze Cardiovascular: Regular rate and rhythm, no appreciable murmurs, no edema Abdomen: Mild tenderness to palpation right upper quadrant.  Positive bowel sounds musculoskeletal: Decreased muscle tone bilaterally upper and lower extremities Skin: Pale and dry Neurologic: CN 2-12 grossly intact. Sensation intact.   Psychiatric: Normal judgment and insight. Alert and oriented x 3.  Depressed mood  Labs on Admission: I have personally reviewed following labs and imaging studies  CBC: Recent Labs  Lab 07/28/19 0806 07/30/19 0947 07/12/2019 0953  WBC 7.6 12.3* 8.5    NEUTROABS 2.8 7.3 3.0  HGB 8.8* 8.4* 7.4*  HCT 28.2* 25.6* 23.5*  MCV 92.5 86.2 91.4  PLT 115* 112* 77*   Basic Metabolic Panel: Recent Labs  Lab 07/30/19 0947 07/21/2019 0953  NA 135 138  K 3.2* 3.5  CL 100 103  CO2 21* 23  GLUCOSE 126* 114*  BUN 13 23  CREATININE 1.07* 0.80  CALCIUM 8.7* 8.8*   GFR: Estimated Creatinine Clearance: 49.8 mL/min (by C-G formula based on SCr of 0.8 mg/dL). Liver Function Tests: Recent Labs  Lab 07/30/19 0947  AST 22  ALT 14  ALKPHOS 85  BILITOT 1.2  PROT 7.7  ALBUMIN 3.5   Recent Labs  Lab 07/30/19 0947  LIPASE 21   No results for input(s): AMMONIA in the last 168 hours. Coagulation Profile: No results for input(s): INR, PROTIME in the last 168 hours. Cardiac Enzymes: No results for input(s): CKTOTAL, CKMB, CKMBINDEX, TROPONINI in the last 168 hours. BNP (last 3 results) No results for input(s): PROBNP in the last 8760 hours. HbA1C: No results for input(s): HGBA1C in the last 72 hours. CBG: No results for input(s): GLUCAP in the last 168 hours. Lipid Profile: No results for input(s): CHOL, HDL, LDLCALC, TRIG, CHOLHDL, LDLDIRECT in the last 72 hours. Thyroid Function Tests: No results for input(s): TSH, T4TOTAL, FREET4, T3FREE, THYROIDAB in the last 72 hours. Anemia Panel: No results for input(s): VITAMINB12, FOLATE, FERRITIN, TIBC, IRON, RETICCTPCT in the last 72 hours. Urine analysis:    Component Value Date/Time   COLORURINE YELLOW (  A) 07/16/2019 0947   APPEARANCEUR CLEAR (A) 07/28/2019 0947   APPEARANCEUR Clear 12/06/2015 1035   LABSPEC 1.020 07/19/2019 0947   LABSPEC 1.008 03/09/2013 1329   PHURINE 6.0 07/26/2019 0947   GLUCOSEU NEGATIVE 07/19/2019 0947   GLUCOSEU NEGATIVE 04/14/2015 0956   HGBUR NEGATIVE 07/23/2019 [place] 08/02/2019 0947   BILIRUBINUR Negative 12/06/2015 1035   BILIRUBINUR Negative 03/09/2013 1329   KETONESUR NEGATIVE 07/26/2019 0947   PROTEINUR 100 (A) 08/01/2019 0947    UROBILINOGEN 0.2 04/14/2015 0956   NITRITE NEGATIVE 07/31/2019 0947   LEUKOCYTESUR NEGATIVE 07/09/2019 0947   LEUKOCYTESUR Negative 03/09/2013 1329    Radiological Exams on Admission: CT Angio Chest PE W and/or Wo Contrast  Result Date: 07/12/2019 CLINICAL DATA:  Shortness of breath EXAM: CT ANGIOGRAPHY CHEST WITH CONTRAST TECHNIQUE: Multidetector CT imaging of the chest was performed using the standard protocol during bolus administration of intravenous contrast. Multiplanar CT image reconstructions and MIPs were obtained to evaluate the vascular anatomy. CONTRAST:  23mL OMNIPAQUE IOHEXOL 350 MG/ML SOLN COMPARISON:  07/22/2019 FINDINGS: Cardiovascular: Cardiomegaly with persistent small pericardial effusion. Pulmonary vasculature is adequately opacified. No filling defect to the segmental branch pulmonary arteries to suggest pulmonary embolism. Right-sided chest port and left-sided implanted cardiac device remain unchanged in positioning. Atherosclerotic calcification of the aorta and coronary arteries. Mediastinum/Nodes: No axillary, mediastinal, or hilar lymphadenopathy. Trachea and esophagus within normal limits. Lungs/Pleura: Post radiation changes with fibrosis and architectural distortion is again identified in the left perihilar region. Moderate emphysema. No focal airspace consolidation, pleural effusion, or pneumothorax. Upper Abdomen: Low-density area within the posterior aspect of the superior spleen. There also appears to be small amount of fluid around the spleen. No perihepatic ascites is evident. Musculoskeletal: Similar degenerative changes throughout the thoracic spine. Review of the MIP images confirms the above findings. IMPRESSION: 1. Negative for pulmonary embolus. 2. Stable post radiation changes in the left perihilar region. 3. Cardiomegaly with persistent small pericardial effusion. 4. Low-density area within the posterior aspect of the superior spleen, may represent a splenic  infarct. Small amount of fluid around the spleen, which appears new from prior. 5. Emphysema and aortic atherosclerosis. Aortic Atherosclerosis (ICD10-I70.0) and Emphysema (ICD10-J43.9). Electronically Signed   By: Davina Poke D.O.   On: 07/16/2019 12:43   DG Chest Port 1 View  Result Date: 07/18/2019 CLINICAL DATA:  Dyspnea. EXAM: PORTABLE CHEST 1 VIEW COMPARISON:  July 30, 2019. FINDINGS: Stable cardiomegaly. No pneumothorax or significant pleural effusion is noted. Right internal jugular Port-A-Cath is unchanged in position. Left-sided pacemaker is unchanged. Lungs are clear. Old left rib fractures are noted. IMPRESSION: No active disease. Electronically Signed   By: Marijo Conception M.D.   On: 07/27/2019 10:07    EKG: Independently reviewed.  Sinus rhythm  Assessment/Plan Active Problems:   Acute diarrhea  Acute diarrhea Weakness Functional decline Poor p.o. intake Etiology of diarrhea is likely related to underlying malignancy and recent poor p.o. intake However given immunocompromised state we cannot rule out infectious causes Plan: Admit inpatient Enteric isolation precautions Check C. Difficile Check GI PCR panel Hold all p.o. antidiarrheals for now Maintenance IV fluids Nutrition consult Continue home mirtazapine for appetite stimulation  High risk myelodysplastic syndrome Stage IIIb large cell neuroendocrine lung tumor Per last oncology note patient not currently a candidate for chemotherapy for mild dysplastic syndrome Notified Dr. Grayland Nichols of patient's admission Palliative care consulted  Symptomatic anemia Pancytopenia Patient hemoglobin dropped one-point the last 4 days No reported black or bloody  stools Patient previously was transfusion dependent however per last oncology note had not required recent transfusion Pancytopenia secondary to malignancy Patient received 1 unit packed red cell in the ED Trend hemoglobin every 8 hourly Transfuse as  needed  Hypertension Continue home carvedilol and losartan  Depression Anxiety Continue home paxil As needed Xanax  Pain, likely cancer related Continue home pain medication regimen Follow any palliative care recommendations  DVT prophylaxis: On hold for now given anemia and thrombocytopenia Code Status: Full Family Communication: none today Disposition Plan: Pending clinical course Consults called: Palliative care Admission status: inpatient   Sidney Ace MD [facility] Pager [phone]  If 7PM-7AM, please contact night-coverage [email or web address] Password [email or web address]  07/13/2019, 3:06 PM

## 2019-08-03 NOTE — ED Notes (Signed)
Updated daughter wendy with pt permission.

## 2019-08-04 ENCOUNTER — Encounter: Payer: Self-pay | Admitting: Oncology

## 2019-08-04 ENCOUNTER — Encounter: Payer: Self-pay | Admitting: *Deleted

## 2019-08-04 ENCOUNTER — Inpatient Hospital Stay: Payer: Medicare Other

## 2019-08-04 DIAGNOSIS — R0603 Acute respiratory distress: Secondary | ICD-10-CM

## 2019-08-04 DIAGNOSIS — Z887 Allergy status to serum and vaccine status: Secondary | ICD-10-CM

## 2019-08-04 DIAGNOSIS — C7B8 Other secondary neuroendocrine tumors: Secondary | ICD-10-CM

## 2019-08-04 DIAGNOSIS — E890 Postprocedural hypothyroidism: Secondary | ICD-10-CM

## 2019-08-04 DIAGNOSIS — M069 Rheumatoid arthritis, unspecified: Secondary | ICD-10-CM

## 2019-08-04 DIAGNOSIS — Z87891 Personal history of nicotine dependence: Secondary | ICD-10-CM

## 2019-08-04 DIAGNOSIS — Z9889 Other specified postprocedural states: Secondary | ICD-10-CM

## 2019-08-04 DIAGNOSIS — R0682 Tachypnea, not elsewhere classified: Secondary | ICD-10-CM

## 2019-08-04 DIAGNOSIS — D649 Anemia, unspecified: Secondary | ICD-10-CM

## 2019-08-04 DIAGNOSIS — Z91048 Other nonmedicinal substance allergy status: Secondary | ICD-10-CM

## 2019-08-04 DIAGNOSIS — R6510 Systemic inflammatory response syndrome (SIRS) of non-infectious origin without acute organ dysfunction: Secondary | ICD-10-CM

## 2019-08-04 DIAGNOSIS — R197 Diarrhea, unspecified: Secondary | ICD-10-CM

## 2019-08-04 DIAGNOSIS — R5383 Other fatigue: Secondary | ICD-10-CM

## 2019-08-04 DIAGNOSIS — D61818 Other pancytopenia: Secondary | ICD-10-CM

## 2019-08-04 DIAGNOSIS — R531 Weakness: Secondary | ICD-10-CM

## 2019-08-04 DIAGNOSIS — C7A8 Other malignant neuroendocrine tumors: Secondary | ICD-10-CM

## 2019-08-04 DIAGNOSIS — Z950 Presence of cardiac pacemaker: Secondary | ICD-10-CM

## 2019-08-04 DIAGNOSIS — Z885 Allergy status to narcotic agent status: Secondary | ICD-10-CM

## 2019-08-04 DIAGNOSIS — Z79890 Hormone replacement therapy (postmenopausal): Secondary | ICD-10-CM

## 2019-08-04 DIAGNOSIS — Z905 Acquired absence of kidney: Secondary | ICD-10-CM

## 2019-08-04 DIAGNOSIS — Z888 Allergy status to other drugs, medicaments and biological substances status: Secondary | ICD-10-CM

## 2019-08-04 DIAGNOSIS — Z79899 Other long term (current) drug therapy: Secondary | ICD-10-CM

## 2019-08-04 DIAGNOSIS — I959 Hypotension, unspecified: Secondary | ICD-10-CM

## 2019-08-04 DIAGNOSIS — Z881 Allergy status to other antibiotic agents status: Secondary | ICD-10-CM

## 2019-08-04 LAB — BASIC METABOLIC PANEL
Anion gap: 10 (ref 5–15)
BUN: 24 mg/dL — ABNORMAL HIGH (ref 8–23)
CO2: 26 mmol/L (ref 22–32)
Calcium: 8.4 mg/dL — ABNORMAL LOW (ref 8.9–10.3)
Chloride: 105 mmol/L (ref 98–111)
Creatinine, Ser: 0.82 mg/dL (ref 0.44–1.00)
GFR calc Af Amer: >60 mL/min (ref >60)
GFR calc non Af Amer: >60 mL/min (ref >60)
Glucose, Bld: 104 mg/dL — ABNORMAL HIGH (ref 70–99)
Potassium: 4.1 mmol/L (ref 3.5–5.1)
Sodium: 141 mmol/L (ref 135–145)

## 2019-08-04 LAB — CBC
HCT: 24.2 % — ABNORMAL LOW (ref 36.0–46.0)
Hemoglobin: 7.6 g/dL — ABNORMAL LOW (ref 12.0–15.0)
MCH: 28.6 pg (ref 26.0–34.0)
MCHC: 31.4 g/dL (ref 30.0–36.0)
MCV: 91 fL (ref 80.0–100.0)
Platelets: 61 10*3/uL — ABNORMAL LOW (ref 150–400)
RBC: 2.66 MIL/uL — ABNORMAL LOW (ref 3.87–5.11)
RDW: 17.4 % — ABNORMAL HIGH (ref 11.5–15.5)
WBC: 5 10*3/uL (ref 4.0–10.5)
nRBC: 1.6 % — ABNORMAL HIGH (ref 0.0–0.2)

## 2019-08-04 LAB — BRAIN NATRIURETIC PEPTIDE: B Natriuretic Peptide: 771 pg/mL — ABNORMAL HIGH (ref 0.0–100.0)

## 2019-08-04 LAB — TROPONIN I (HIGH SENSITIVITY)
Troponin I (High Sensitivity): 141 ng/L (ref <18)
Troponin I (High Sensitivity): 112 ng/L (ref <18)

## 2019-08-04 LAB — CBC WITH DIFFERENTIAL/PLATELET
Abs Immature Granulocytes: 0.6 10*3/uL — ABNORMAL HIGH (ref 0.00–0.07)
Band Neutrophils: 6 %
Basophils Absolute: 0 10*3/uL (ref 0.0–0.1)
Basophils Relative: 0 %
Blasts: 3 %
Eosinophils Absolute: 0 10*3/uL (ref 0.0–0.5)
Eosinophils Relative: 0 %
HCT: 27 % — ABNORMAL LOW (ref 36.0–46.0)
Hemoglobin: 8 g/dL — ABNORMAL LOW (ref 12.0–15.0)
Lymphocytes Relative: 30 %
Lymphs Abs: 3.8 10*3/uL (ref 0.7–4.0)
MCH: 28.1 pg (ref 26.0–34.0)
MCHC: 29.6 g/dL — ABNORMAL LOW (ref 30.0–36.0)
MCV: 94.7 fL (ref 80.0–100.0)
Metamyelocytes Relative: 1 %
Monocytes Absolute: 2.1 10*3/uL — ABNORMAL HIGH (ref 0.1–1.0)
Monocytes Relative: 17 %
Myelocytes: 4 %
Neutro Abs: 5.7 10*3/uL (ref 1.7–7.7)
Neutrophils Relative %: 39 %
Platelets: 67 10*3/uL — ABNORMAL LOW (ref 150–400)
RBC: 2.85 MIL/uL — ABNORMAL LOW (ref 3.87–5.11)
RDW: 17.4 % — ABNORMAL HIGH (ref 11.5–15.5)
WBC: 12.6 10*3/uL — ABNORMAL HIGH (ref 4.0–10.5)
nRBC: 1.6 % — ABNORMAL HIGH (ref 0.0–0.2)
nRBC: 3 /100 WBC — ABNORMAL HIGH

## 2019-08-04 LAB — COMPREHENSIVE METABOLIC PANEL
ALT: 11 U/L (ref 0–44)
AST: 19 U/L (ref 15–41)
Albumin: 2.5 g/dL — ABNORMAL LOW (ref 3.5–5.0)
Alkaline Phosphatase: 87 U/L (ref 38–126)
Anion gap: 15 (ref 5–15)
BUN: 30 mg/dL — ABNORMAL HIGH (ref 8–23)
CO2: 20 mmol/L — ABNORMAL LOW (ref 22–32)
Calcium: 8.4 mg/dL — ABNORMAL LOW (ref 8.9–10.3)
Chloride: 107 mmol/L (ref 98–111)
Creatinine, Ser: 1.32 mg/dL — ABNORMAL HIGH (ref 0.44–1.00)
GFR calc Af Amer: 45 mL/min — ABNORMAL LOW (ref >60)
GFR calc non Af Amer: 39 mL/min — ABNORMAL LOW (ref >60)
Glucose, Bld: 132 mg/dL — ABNORMAL HIGH (ref 70–99)
Potassium: 4.5 mmol/L (ref 3.5–5.1)
Sodium: 142 mmol/L (ref 135–145)
Total Bilirubin: 0.9 mg/dL (ref 0.3–1.2)
Total Protein: 6.7 g/dL (ref 6.5–8.1)

## 2019-08-04 LAB — BLOOD GAS, ARTERIAL
Acid-base deficit: 1.1 mmol/L (ref 0.0–2.0)
Bicarbonate: 26.1 mmol/L (ref 20.0–28.0)
FIO2: 0.24
O2 Saturation: 91.8 %
Patient temperature: 37
pCO2 arterial: 53 mmHg — ABNORMAL HIGH (ref 32.0–48.0)
pH, Arterial: 7.3 — ABNORMAL LOW (ref 7.350–7.450)
pO2, Arterial: 70 mmHg — ABNORMAL LOW (ref 83.0–108.0)

## 2019-08-04 LAB — TYPE AND SCREEN
ABO/RH(D): O POS
Antibody Screen: NEGATIVE
Unit division: 0

## 2019-08-04 LAB — COOXEMETRY PANEL
Carboxyhemoglobin: 2.1 % — ABNORMAL HIGH (ref 0.5–1.5)
Methemoglobin: 1.2 % (ref 0.0–1.5)
O2 Saturation: 96.1 %

## 2019-08-04 LAB — BPAM RBC
Blood Product Expiration Date: 202101162359
ISSUE DATE / TIME: 202012291223
Unit Type and Rh: 5100

## 2019-08-04 LAB — HEMOGLOBIN
Hemoglobin: 7.5 g/dL — ABNORMAL LOW (ref 12.0–15.0)
Hemoglobin: 8 g/dL — ABNORMAL LOW (ref 12.0–15.0)

## 2019-08-04 LAB — HEPATIC FUNCTION PANEL
ALT: 11 U/L (ref 0–44)
AST: 14 U/L — ABNORMAL LOW (ref 15–41)
Albumin: 2.6 g/dL — ABNORMAL LOW (ref 3.5–5.0)
Alkaline Phosphatase: 65 U/L (ref 38–126)
Bilirubin, Direct: 0.2 mg/dL (ref 0.0–0.2)
Indirect Bilirubin: 0.6 mg/dL (ref 0.3–0.9)
Total Bilirubin: 0.8 mg/dL (ref 0.3–1.2)
Total Protein: 6.8 g/dL (ref 6.5–8.1)

## 2019-08-04 LAB — LACTIC ACID, PLASMA: Lactic Acid, Venous: 1.1 mmol/L (ref 0.5–1.9)

## 2019-08-04 LAB — MAGNESIUM: Magnesium: 1.8 mg/dL (ref 1.7–2.4)

## 2019-08-04 LAB — GLUCOSE, CAPILLARY: Glucose-Capillary: 124 mg/dL — ABNORMAL HIGH (ref 70–99)

## 2019-08-04 LAB — PROCALCITONIN: Procalcitonin: 3.53 ng/mL

## 2019-08-04 MED ORDER — SODIUM CHLORIDE 0.9 % IV SOLN
1.0000 g | INTRAVENOUS | Status: DC
  Administered 2019-08-04: 13:00:00 1 g via INTRAVENOUS
  Filled 2019-08-04: qty 1

## 2019-08-04 MED ORDER — BOOST / RESOURCE BREEZE PO LIQD CUSTOM: 1.0000 | Freq: Three times a day (TID) | ORAL | Status: DC

## 2019-08-04 MED ORDER — SODIUM CHLORIDE 0.9 % IV SOLN: INTRAVENOUS | Status: DC

## 2019-08-04 MED ORDER — LACTATED RINGERS IV SOLN: INTRAVENOUS | Status: DC

## 2019-08-04 MED ORDER — SODIUM CHLORIDE 0.9 % IV BOLUS
250.0000 mL | Freq: Once | INTRAVENOUS | Status: AC
  Administered 2019-08-04: 11:00:00 250 mL via INTRAVENOUS

## 2019-08-04 MED ORDER — ENSURE ENLIVE PO LIQD: 237.0000 mL | Freq: Two times a day (BID) | ORAL | Status: DC

## 2019-08-04 MED ORDER — NOREPINEPHRINE 4 MG/250ML-% IV SOLN
0.0000 ug/min | INTRAVENOUS | Status: DC
  Administered 2019-08-04: 23:00:00 8 ug/min via INTRAVENOUS
  Administered 2019-08-04: 13:00:00 2 ug/min via INTRAVENOUS
  Administered 2019-08-05: 07:00:00 8 ug/min via INTRAVENOUS
  Filled 2019-08-04 (×3): qty 250

## 2019-08-04 MED ORDER — VANCOMYCIN HCL IN DEXTROSE 1-5 GM/200ML-% IV SOLN
1000.0000 mg | INTRAVENOUS | Status: DC
  Filled 2019-08-04: qty 200

## 2019-08-04 MED ORDER — VANCOMYCIN HCL 1500 MG/300ML IV SOLN
1500.0000 mg | Freq: Once | INTRAVENOUS | Status: AC
  Administered 2019-08-04: 17:00:00 1500 mg via INTRAVENOUS
  Filled 2019-08-04: qty 300

## 2019-08-04 MED ORDER — SODIUM CHLORIDE 0.9 % IV SOLN
1.0000 g | INTRAVENOUS | Status: DC
  Filled 2019-08-04: qty 10

## 2019-08-04 MED ORDER — SODIUM CHLORIDE 0.9 % IV SOLN
2.0000 g | Freq: Two times a day (BID) | INTRAVENOUS | Status: DC
  Administered 2019-08-04 – 2019-08-05 (×2): 2 g via INTRAVENOUS
  Filled 2019-08-04 (×3): qty 2

## 2019-08-04 NOTE — Progress Notes (Signed)
[facility] [place] NAME: Caroline Nichols    MR#:  [id number]  DATE OF BIRTH:  [birth year]  SUBJECTIVE:    REVIEW OF SYSTEMS:   ROS Tolerating Diet: Tolerating PT:   DRUG ALLERGIES:   Allergies  Allergen Reactions  . Metrizamide     Other reaction(s): Other (See Comments)  . Adhesive [Tape] Other (See Comments)    "irritate skin"  . Augmentin [Amoxicillin-Pot Clavulanate] Diarrhea    Severe diarrhea  . Codeine Nausea Only  . Morphine And Related Other (See Comments)    Shut down her organs  . Other     Skin irritation  . Tetanus Toxoid Other (See Comments)    REACTION: ARM SWELLING,REDNESS  . Tramadol Nausea And Vomiting    VITALS:  Blood pressure (!) 80/55, pulse (!) 120, temperature 98.5 F (36.9 C), temperature source Oral, resp. rate (!) 46, height 5\' 2"  (1.575 m), weight 60.8 kg, SpO2 100 %.  PHYSICAL EXAMINATION:   Physical Exam  GENERAL:  78 y.o.-year-old patient lying in the bed with no acute distress.  EYES: Pupils equal, round, reactive to light and accommodation. No scleral icterus. Extraocular muscles intact.  HEENT: Head atraumatic, normocephalic. Oropharynx and nasopharynx clear.  NECK:  Supple, no jugular venous distention. No thyroid enlargement, no tenderness.  LUNGS: Normal breath sounds bilaterally, no wheezing, rales, rhonchi. No use of accessory muscles of respiration.  CARDIOVASCULAR: S1, S2 normal. No murmurs, rubs, or gallops.  ABDOMEN: Soft, nontender, nondistended. Bowel sounds present. No organomegaly or mass.  EXTREMITIES: No cyanosis, clubbing or edema b/l.    NEUROLOGIC: Cranial nerves II through XII are intact. No focal Motor or sensory deficits b/l.   PSYCHIATRIC:  patient is alert and oriented x 3.  SKIN: No obvious rash, lesion, or ulcer.   LABORATORY PANEL:  CBC Recent Labs  Lab 08/04/19 0500 08/04/19 0826  WBC 5.0  --   HGB 7.6* 8.0*  HCT 24.2*  --   PLT 61*  --      Chemistries  Recent Labs  Lab 07/30/19 0947 08/04/19 0500  NA 135 141  K 3.2* 4.1  CL 100 105  CO2 21* 26  GLUCOSE 126* 104*  BUN 13 24*  CREATININE 1.07* 0.82  CALCIUM 8.7* 8.4*  AST 22  --   ALT 14  --   ALKPHOS 85  --   BILITOT 1.2  --    Cardiac Enzymes No results for input(s): TROPONINI in the last 168 hours. RADIOLOGY:  CT Angio Chest PE W and/or Wo Contrast  Result Date: 07/19/2019 CLINICAL DATA:  Shortness of breath EXAM: CT ANGIOGRAPHY CHEST WITH CONTRAST TECHNIQUE: Multidetector CT imaging of the chest was performed using the standard protocol during bolus administration of intravenous contrast. Multiplanar CT image reconstructions and MIPs were obtained to evaluate the vascular anatomy. CONTRAST:  23mL OMNIPAQUE IOHEXOL 350 MG/ML SOLN COMPARISON:  07/22/2019 FINDINGS: Cardiovascular: Cardiomegaly with persistent small pericardial effusion. Pulmonary vasculature is adequately opacified. No filling defect to the segmental branch pulmonary arteries to suggest pulmonary embolism. Right-sided chest port and left-sided implanted cardiac device remain unchanged in positioning. Atherosclerotic calcification of the aorta and coronary arteries. Mediastinum/Nodes: No axillary, mediastinal, or hilar lymphadenopathy. Trachea and esophagus within normal limits. Lungs/Pleura: Post radiation changes with fibrosis and architectural distortion is again identified in the left perihilar region. Moderate emphysema. No focal airspace consolidation, pleural effusion, or pneumothorax. Upper Abdomen: Low-density area within the posterior aspect of the superior spleen.  There also appears to be small amount of fluid around the spleen. No perihepatic ascites is evident. Musculoskeletal: Similar degenerative changes throughout the thoracic spine. Review of the MIP images confirms the above findings. IMPRESSION: 1. Negative for pulmonary embolus. 2. Stable post radiation changes in the left perihilar  region. 3. Cardiomegaly with persistent small pericardial effusion. 4. Low-density area within the posterior aspect of the superior spleen, may represent a splenic infarct. Small amount of fluid around the spleen, which appears new from prior. 5. Emphysema and aortic atherosclerosis. Aortic Atherosclerosis (ICD10-I70.0) and Emphysema (ICD10-J43.9). Electronically Signed   By: Davina Poke D.O.   On: 07/30/2019 12:43   DG Chest Port 1 View  Result Date: 07/21/2019 CLINICAL DATA:  Acute respiratory distress. EXAM: PORTABLE CHEST 1 VIEW COMPARISON:  CTA chest and chest x-ray from same day. FINDINGS: Unchanged left chest wall pacemaker. Unchanged right chest wall port catheter. Stable cardiomegaly. Normal pulmonary vascularity. Unchanged subtle increased density in the left upper lobe. No pleural effusion or pneumothorax. No acute osseous abnormality. IMPRESSION: 1. Unchanged subtle increased density in the left upper lobe, corresponding to faint ground-glass density seen on chest CT from earlier today, likely infectious or inflammatory. Electronically Signed   By: Titus Dubin M.D.   On: 07/13/2019 20:47   DG Chest Port 1 View  Result Date: 07/08/2019 CLINICAL DATA:  Dyspnea. EXAM: PORTABLE CHEST 1 VIEW COMPARISON:  July 30, 2019. FINDINGS: Stable cardiomegaly. No pneumothorax or significant pleural effusion is noted. Right internal jugular Port-A-Cath is unchanged in position. Left-sided pacemaker is unchanged. Lungs are clear. Old left rib fractures are noted. IMPRESSION: No active disease. Electronically Signed   By: Marijo Conception M.D.   On: 07/18/2019 10:07   ASSESSMENT AND PLAN:   Caroline Nichols is a 78 y.o. female with medical history significant of chronic anemia, anxiety, rheumatoid arthritis, diabetes insipidus, GERD, splenic laceration, hyperlipidemia, hypertension.  Pain also had a history of 2 separate primary cancers including high risk myelodysplastic syndrome not amenable to  chemotherapy currently as well as stage IIb large cell neuroendocrine tumor.Patient presented to the hospital today with chief complaint of worsening fatigue, dyspnea, diarrhea, poor p.o. intake.   SIRS suspected due to Acute on chronic diarrhea with significant weakness and functional decline with  Poor p.o. intake -Etiology of diarrhea is likely related to underlying malignancy(neuroendocrine tumor) and recent poor p.o. intake -However given immunocompromised state we cannot rule out infectious causes -patient is tachycardic, tachypnea, elevated Pro calcitonin and hypotensive. -Lactic acid within normal limits -low-grade fever -blood culture sent. Urine culture sent -starting empirically on IV Rocephin, IV fluid bolus received. -Transfer to step down - Check C. Difficile-- still not given since no diarrhea Check GI PCR panel-- pending Hold all p.o. antidiarrheals for now -fluid bolus and maintenance fluid. Pressers if needed -Continue home mirtazapine for appetite stimulation -consultation with Dr. 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 Grayland Ormond of patient's admission Palliative care consulted-- appreciate input. Waiting for daughter [name] to come discuss code status with patient. Per [name] as far she remembers patient is a DNR however still needs to be confirmed  Symptomatic anemia/Pancytopenia Patient hemoglobin dropped one-point the last 4 days No reported black or bloody stools Patient previously was transfusion dependent however per last oncology note had not required recent transfusion Pancytopenia secondary to malignancy Patient received 1 unit packed  red cell in the ED on December 25 Transfuse as needed  Hypertension-- now with hypotension hold losartan and Coreg  chronic  Depression Anxiety Continue home paxil As needed Xanax  Pain, likely cancer related Continue home pain medication regimen Follow any palliative care recommendations  Recent splenic infarct/laceration July 22, 2019 CT abdomen -she denies any abdominal pain. Abdominal exam soft. -Consider surgical consultation if needed  Procedures: none Family communication : spoke with daughter [name] who was [facility] POA Consults : ID Discharge Disposition : to be determined CODE STATUS: full for now DVT Prophylaxis : Lovenox  TOTAL TIME TAKING CARE OF THIS PATIENT: *35** minutes.  >50% time spent on counselling and coordination of care  POSSIBLE D/C IN **?* DAYS, DEPENDING ON CLINICAL CONDITION.  Note: This dictation was prepared with Dragon dictation along with smaller phrase technology. Any transcriptional errors that result from this process are unintentional.  Fritzi Mandes M.D on 08/04/2019 at 11:38 AM  Between 7am to 6pm - Pager - [phone]  After 6pm go to [email or web address]  [facility]   CC: Primary care physician; [name], MDPatient ID: [name], female   DOB: 07-06-1941, 78 y.o.   MRN: [id number]

## 2019-08-04 NOTE — Progress Notes (Signed)
PT Cancellation Note  Patient Details Name: Caroline Nichols MRN: [id number] DOB: Jul 18, 1941   Cancelled Treatment:    Reason Eval/Treat Not Completed: Medical issues which prohibited therapy; Resting HR 126 and RR in the upper 30's to mid 40's.  Spoke to MD who agreed to hold PT evaluation until pt is more medically appropriate.     Linus Salmons PT, DPT 08/04/19, 9:42 AM

## 2019-08-04 NOTE — Consult Note (Signed)
[facility]  [phone] [phone]  ID: [name] OB: 01/13/1941  MR#: [id number]  UUV#:[id number]  Patient Care Team: [name], MD as PCP - General (Internal Medicine) [name], MD (Internal Medicine) [name] [name], MD (General Surgery) Lloyd Huger, MD as Consulting Physician (Oncology) Noreene Filbert, MD as Referring Physician (Radiation Oncology)  CHIEF COMPLAINT: Pancytopenia secondary to MDS, history of neuroendocrine carcinoma of the lung and renal cell carcinoma.  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  Who was recently admitted to the hospital with worsening anemia, declining performance status, and failure to thrive.  Her performance status has been declining over the past several weeks.  She has chronic diarrhea and poor p.o. intake.  Patient was recently transferred to the ICU for hypotension and tachycardia.  She is currently lethargic and difficult to arouse, patient's daughter is at bedside.  REVIEW OF SYSTEMS:   Review of Systems  Unable to perform ROS: Acuity of condition    PAST MEDICAL HISTORY: Past Medical History:  Diagnosis Date  . Anemia 02/2013   F/U with Dr. Grayland Ormond for Feraheme injections  . Anxiety   . Arthritis    Possible RA - Follow up appt with Dr. Jefm Bryant  . Cellulitis of arm, right   . Chest pain 03/26/2013  . Diabetes insipidus ([place])    On desmopressin  . GERD (gastroesophageal reflux disease)   . H/O seasonal allergies   . H/O transfusion of packed red blood cells 02/2013   3 units PRBC for severe anemia 02/2013  . Herniated disc   . History of kidney stones   . Hyperlipidemia   . Hypertension   . Hyperthyroidism    s/p radiation therapy, now hypothyroidism  . IBS (irritable bowel syndrome)   . Lung cancer ([place]) 06/2017   left  . Migraine   . Pacemaker    a. s/p successful implantation of a  Medtronic CapSureFix Novus MRIT SureScan serial # [id number] on 06/28/16 by Dr. Caryl Comes for sinus node dysfunction.  . Pancreatic cyst   . Paroxysmal A-fib ([place])   . Renal cell cancer, right ([place]) 10/23/2015   Right partial nephrectomy.    PAST SURGICAL HISTORY: Past Surgical History:  Procedure Laterality Date  . ABDOMINAL HYSTERECTOMY    . BREAST EXCISIONAL BIOPSY Right 2005   neg  . BREAST SURGERY  1990s   Biopsy  . CERVICAL SPINE SURGERY     Bone fusion  . COLONOSCOPY  2008?   [place]  . COLONOSCOPY WITH PROPOFOL N/A 10/15/2016   Procedure: COLONOSCOPY WITH PROPOFOL;  Surgeon: Robert Bellow, MD;  Location: [facility] ENDOSCOPY;  Service: Endoscopy;  Laterality: N/A;  . EP IMPLANTABLE DEVICE N/A 06/28/2016   Procedure: Pacemaker Implant;  Surgeon: Deboraha Sprang, MD;  Location: [facility] CV LAB;  Service: Cardiovascular;  Laterality: N/A;  . HAMMER TOE SURGERY    . HERNIA REPAIR Left    Inguinal Hernia Repair  . PORTA CATH INSERTION N/A 07/24/2017   Procedure: PORTA CATH INSERTION;  Surgeon: Algernon Huxley, MD;  Location: [facility] LAB;  Service: Cardiovascular;  Laterality: N/A;  . ROBOTIC ASSITED PARTIAL NEPHRECTOMY Right 10/23/2015   Procedure: ROBOTIC ASSITED PARTIAL NEPHRECTOMY with intraop ultrasound;  Surgeon: Hollice Espy, MD;  Location: [facility] ORS;  Service: Urology;  Laterality: Right;  . TUBAL LIGATION      FAMILY HISTORY: Family History  Problem Relation Age of  Onset  . Heart disease Mother   . Heart disease Father   . Diabetes Brother   . Kidney disease Brother   . Stroke Daughter        due to medication reaction  . Breast cancer Cousin   . Prostate cancer Neg Hx   . Hematuria Neg Hx     ADVANCED DIRECTIVES (Y/N):  _0 @  HEALTH MAINTENANCE: Social History   Tobacco Use  . Smoking status: Former Smoker    Packs/day: 0.50    Years: 30.00    Pack years: 15.00    Types: Cigarettes    Quit date: 12/03/2012    Years since quitting: 6.6    . Smokeless tobacco: Never Used  Substance Use Topics  . Alcohol use: No    Alcohol/week: 0.0 standard drinks  . Drug use: No     Colonoscopy:  PAP:  Bone density:  Lipid panel:  Allergies  Allergen Reactions  . Metrizamide     Other reaction(s): Other (See Comments)  . Adhesive [Tape] Other (See Comments)    "irritate skin"  . Augmentin [Amoxicillin-Pot Clavulanate] Diarrhea    Severe diarrhea  . Codeine Nausea Only  . Morphine And Related Other (See Comments)    Shut down her organs  . Other     Skin irritation  . Tetanus Toxoid Other (See Comments)    REACTION: ARM SWELLING,REDNESS  . Tramadol Nausea And Vomiting    Current Facility-Administered Medications  Medication Dose Route Frequency Provider Last Rate Last Admin  . 0.9 %  sodium chloride infusion   Intravenous Continuous Fritzi Mandes, MD 125 mL/hr at 08/04/19 1237 Rate Change at 08/04/19 1237  . acetaminophen (TYLENOL) tablet 650 mg  650 mg Oral Q6H PRN Ralene Muskrat B, MD   650 mg at 08/04/19 0933  . albuterol (PROVENTIL) (2.5 MG/3ML) 0.083% nebulizer solution 2.5 mg  2.5 mg Inhalation Q6H PRN Sreenath, Sudheer B, MD      . ALPRAZolam Duanne Moron) tablet 0.25 mg  0.25 mg Oral BID PRN Priscella Mann, Sudheer B, MD      . ascorbic acid (VITAMIN C) tablet 500 mg  500 mg Oral Daily Priscella Mann, Sudheer B, MD   500 mg at 08/04/19 0858  . brimonidine (ALPHAGAN) 0.2 % ophthalmic solution 1 drop  1 drop Both Eyes BID Ralene Muskrat B, MD   1 drop at 08/04/19 0919  . calcium-vitamin D (OSCAL WITH D) 500-200 MG-UNIT per tablet 1 tablet  1 tablet Oral Daily Ralene Muskrat B, MD   1 tablet at 08/04/19 0910  . cefTRIAXone (ROCEPHIN) 1 g in sodium chloride 0.9 % 100 mL IVPB  1 g Intravenous Q24H Fritzi Mandes, MD      . Chlorhexidine Gluconate Cloth 2 % PADS 6 each  6 each Topical Daily Sidney Ace, MD   6 each at 08/04/19 [phone]  . cholecalciferol (VITAMIN D3) tablet 1,000 Units  1,000 Units Oral Daily Priscella Mann, Sudheer B, MD    1,000 Units at 08/04/19 0910  . dorzolamide (TRUSOPT) 2 % ophthalmic solution 1 drop  1 drop Both Eyes BID Ralene Muskrat B, MD   1 drop at 08/04/19 [phone]  . feeding supplement (BOOST / RESOURCE BREEZE) liquid 1 Container  1 Container Oral TID WC Fritzi Mandes, MD      . feeding supplement (ENSURE ENLIVE) (ENSURE ENLIVE) liquid 237 mL  237 mL Oral BID BM Fritzi Mandes, MD      . ferrous sulfate tablet 325 mg  325 mg Oral  Q breakfast Ralene Muskrat B, MD   325 mg at 08/04/19 0911  . folic acid (FOLVITE) tablet 1 mg  1 mg Oral Daily Sreenath, Sudheer B, MD   1 mg at 08/04/19 0856  . gabapentin (NEURONTIN) capsule 300 mg  300 mg Oral TID Ralene Muskrat B, MD   300 mg at 08/04/19 0856  . heparin lock flush 100 unit/mL  250 Units Intracatheter PRN Lloyd Huger, MD      . heparin lock flush 100 unit/mL  500 Units Intracatheter Daily PRN Lloyd Huger, MD      . heparin lock flush 100 unit/mL  250 Units Intracatheter PRN Lloyd Huger, MD      . HYDROcodone-acetaminophen (NORCO) 10-325 MG per tablet 1 tablet  1 tablet Oral Q6H PRN Ralene Muskrat B, MD      . hydroxychloroquine (PLAQUENIL) tablet 400 mg  400 mg Oral Daily Ralene Muskrat B, MD   400 mg at 08/04/19 0858  . latanoprost (XALATAN) 0.005 % ophthalmic solution 1 drop  1 drop Both Eyes QHS Ralene Muskrat B, MD   1 drop at 07/28/2019 2100  . levothyroxine (SYNTHROID) tablet 100 mcg  100 mcg Oral QAC breakfast Sreenath, Sudheer B, MD      . mirtazapine (REMERON) tablet 15 mg  15 mg Oral QHS Sreenath, Sudheer B, MD      . nitroGLYCERIN (NITROSTAT) SL tablet 0.4 mg  0.4 mg Sublingual Q5 Min x 3 PRN Sreenath, Sudheer B, MD      . norepinephrine (LEVOPHED) 6m in 2535mpremix infusion  0-40 mcg/min Intravenous Titrated PaFritzi MandesMD 7.5 mL/hr at 08/04/19 1327 2 mcg/min at 08/04/19 1327  . ondansetron (ZOFRAN-ODT) disintegrating tablet 4 mg  4 mg Oral Q8H PRN Sreenath, Sudheer B, MD      . oxybutynin (DITROPAN-XL) 24 hr  tablet 10 mg  10 mg Oral QHS Sreenath, Sudheer B, MD      . pantoprazole (PROTONIX) EC tablet 40 mg  40 mg Oral BID SrRalene Muskrat, MD   40 mg at 08/04/19 0857  . PARoxetine (PAXIL) tablet 10 mg  10 mg Oral QPC supper Sreenath, Sudheer B, MD      . saccharomyces boulardii (FLORASTOR) capsule 250 mg  250 mg Oral BID SrRalene Muskrat, MD   250 mg at 08/04/19 0859  . sodium chloride flush (NS) 0.9 % injection 10 mL  10 mL Intracatheter PRN FiLloyd HugerMD      . sodium chloride flush (NS) 0.9 % injection 10 mL  10 mL Intracatheter PRN FiLloyd HugerMD      . sodium chloride flush (NS) 0.9 % injection 3 mL  3 mL Intracatheter PRN FiLloyd HugerMD      . sodium chloride flush (NS) 0.9 % injection 3 mL  3 mL Intracatheter PRN FiLloyd HugerMD      . thiamine tablet 100 mg  100 mg Oral Daily SrRalene Muskrat, MD   100 mg at 08/04/19 0856  . traZODone (DESYREL) tablet 50 mg  50 mg Oral QHS PRN SrRalene Muskrat, MD        OBJECTIVE: Vitals:   08/04/19 1209 08/04/19 1228  BP: (!) 74/49 (!) 77/52  Pulse: 74 77  Resp: (!) 44 (!) 50  Temp:  (!) 94.1 F (34.5 C)  SpO2: 96% 100%     Body mass index is 24.51 kg/m.    ECOG FS:4 - Bedbound   General:  Ill-appearing, no acute distress. Eyes: Pink conjunctiva, anicteric sclera. HEENT: Normocephalic, moist mucous membranes. Lungs: No audible wheezing or coughing. Heart: Hypotensive, but currently regular rate. Abdomen: Soft, nontender, no obvious distention. Musculoskeletal: No edema, cyanosis, or clubbing. Neuro: Lethargic, difficult to arouse.  Cranial nerves grossly intact. Skin: No rashes or petechiae noted.   LAB RESULTS:  Lab Results  Component Value Date   NA 141 08/04/2019   K 4.1 08/04/2019   CL 105 08/04/2019   CO2 26 08/04/2019   GLUCOSE 104 (H) 08/04/2019   BUN 24 (H) 08/04/2019   CREATININE 0.82 08/04/2019   CALCIUM 8.4 (L) 08/04/2019   PROT 6.8 08/04/2019   ALBUMIN 2.6 (L) 08/04/2019     AST 14 (L) 08/04/2019   ALT 11 08/04/2019   ALKPHOS 65 08/04/2019   BILITOT 0.8 08/04/2019   GFRNONAA >60 08/04/2019   GFRAA >60 08/04/2019    Lab Results  Component Value Date   WBC 5.0 08/04/2019   NEUTROABS 3.0 07/20/2019   HGB 8.0 (L) 08/04/2019   HCT 24.2 (L) 08/04/2019   MCV 91.0 08/04/2019   PLT 61 (L) 08/04/2019     STUDIES: CT Chest W Contrast  Result Date: 07/22/2019 CLINICAL DATA:  History of lung cancer.  Restaging. EXAM: CT CHEST, ABDOMEN, AND PELVIS WITH CONTRAST TECHNIQUE: Multidetector CT imaging of the chest, abdomen and pelvis was performed following the standard protocol during bolus administration of intravenous contrast. CONTRAST:  133m OMNIPAQUE IOHEXOL 300 MG/ML  SOLN COMPARISON:  01/13/2019 FINDINGS: CT CHEST FINDINGS Cardiovascular: Cardiac enlargement. The small to moderate posterior pericardial effusion identified, image 111/6. Aortic atherosclerosis. Lad and RCA coronary artery calcifications. Mediastinum/Nodes: Thyroid gland not visualized and may be surgically absent The trachea appears patent and is midline. Normal appearance of the esophagus. No mediastinal, hilar, supraclavicular or axillary adenopathy. Lungs/Pleura: Paraseptal and centrilobular emphysema. No pleural effusion. Bandlike area of fibrosis and masslike architectural distortion within the perihilar left lung is again identified compatible with changes due to external beam radiation. The appearance is unchanged compared with the previous exam. No suspicious pulmonary nodule or mass identified at this time. Musculoskeletal: Healing left lateral rib deformities. Multi level degenerative disc disease identified throughout the thoracic spine. CT ABDOMEN PELVIS FINDINGS Hepatobiliary: No focal liver abnormality. Gallbladder is unremarkable. No biliary ductal dilatation. Pancreas: Similar appearance of mild increase caliber of the main duct. No inflammation. No mass Spleen: The spleen measures 9.7 x  6.3 by 15.6 cm (volume = 500 cm^3). New linear, bandlike area of hypoenhancement within the superior aspect of the spleen is identified, image 66/4 and is concerning for a area of splenic laceration. Adrenals/Urinary Tract: Normal appearance of the adrenal glands. Right kidney cyst measures 1.7 cm. No mass or hydronephrosis. The urinary bladder is unremarkable. Stomach/Bowel: Stomach is within normal limits. Appendix appears normal. No evidence of bowel wall thickening, distention, or inflammatory changes. Vascular/Lymphatic: Aortic atherosclerosis. No aneurysm. No abdominopelvic adenopathy identified. Reproductive: Status post hysterectomy. No adnexal masses. Other: There is a small to moderate volume of free fluid within the abdomen and pelvis. No discrete fluid collections. Musculoskeletal: Scoliosis and degenerative disc disease. No aggressive lytic or sclerotic bone lesions identified the at this time. IMPRESSION: 1. Stable changes of external beam radiation within the left lung without findings to suggest Lung cancer recurrence within the chest. 2. Significant increase in volume of the spleen. New bandlike area of low attenuation within the superior pole of spleen concerning for with age indeterminate splenic laceration. Alternatively, findings may reflect  splenic infarct. 3. New small to moderate volume of free fluid within the abdomen and pelvis. Etiology is indeterminate but in the setting of splenic laceration could represent resolving hemoperitoneum. 4. Small pericardial effusion, new from previous study. 5. Healing left rib fractures. 6. Aortic Atherosclerosis (ICD10-I70.0) and Emphysema (ICD10-J43.9). Electronically Signed   By: Kerby Moors M.D.   On: 07/22/2019 10:40   CT Angio Chest PE W and/or Wo Contrast  Result Date: 07/21/2019 CLINICAL DATA:  Shortness of breath EXAM: CT ANGIOGRAPHY CHEST WITH CONTRAST TECHNIQUE: Multidetector CT imaging of the chest was performed using the standard  protocol during bolus administration of intravenous contrast. Multiplanar CT image reconstructions and MIPs were obtained to evaluate the vascular anatomy. CONTRAST:  71m OMNIPAQUE IOHEXOL 350 MG/ML SOLN COMPARISON:  07/22/2019 FINDINGS: Cardiovascular: Cardiomegaly with persistent small pericardial effusion. Pulmonary vasculature is adequately opacified. No filling defect to the segmental branch pulmonary arteries to suggest pulmonary embolism. Right-sided chest port and left-sided implanted cardiac device remain unchanged in positioning. Atherosclerotic calcification of the aorta and coronary arteries. Mediastinum/Nodes: No axillary, mediastinal, or hilar lymphadenopathy. Trachea and esophagus within normal limits. Lungs/Pleura: Post radiation changes with fibrosis and architectural distortion is again identified in the left perihilar region. Moderate emphysema. No focal airspace consolidation, pleural effusion, or pneumothorax. Upper Abdomen: Low-density area within the posterior aspect of the superior spleen. There also appears to be small amount of fluid around the spleen. No perihepatic ascites is evident. Musculoskeletal: Similar degenerative changes throughout the thoracic spine. Review of the MIP images confirms the above findings. IMPRESSION: 1. Negative for pulmonary embolus. 2. Stable post radiation changes in the left perihilar region. 3. Cardiomegaly with persistent small pericardial effusion. 4. Low-density area within the posterior aspect of the superior spleen, may represent a splenic infarct. Small amount of fluid around the spleen, which appears new from prior. 5. Emphysema and aortic atherosclerosis. Aortic Atherosclerosis (ICD10-I70.0) and Emphysema (ICD10-J43.9). Electronically Signed   By: NDavina PokeD.O.   On: 07/30/2019 12:43   CT Abdomen Pelvis W Contrast  Result Date: 07/22/2019 CLINICAL DATA:  History of lung cancer.  Restaging. EXAM: CT CHEST, ABDOMEN, AND PELVIS WITH  CONTRAST TECHNIQUE: Multidetector CT imaging of the chest, abdomen and pelvis was performed following the standard protocol during bolus administration of intravenous contrast. CONTRAST:  1049mOMNIPAQUE IOHEXOL 300 MG/ML  SOLN COMPARISON:  01/13/2019 FINDINGS: CT CHEST FINDINGS Cardiovascular: Cardiac enlargement. The small to moderate posterior pericardial effusion identified, image 111/6. Aortic atherosclerosis. Lad and RCA coronary artery calcifications. Mediastinum/Nodes: Thyroid gland not visualized and may be surgically absent The trachea appears patent and is midline. Normal appearance of the esophagus. No mediastinal, hilar, supraclavicular or axillary adenopathy. Lungs/Pleura: Paraseptal and centrilobular emphysema. No pleural effusion. Bandlike area of fibrosis and masslike architectural distortion within the perihilar left lung is again identified compatible with changes due to external beam radiation. The appearance is unchanged compared with the previous exam. No suspicious pulmonary nodule or mass identified at this time. Musculoskeletal: Healing left lateral rib deformities. Multi level degenerative disc disease identified throughout the thoracic spine. CT ABDOMEN PELVIS FINDINGS Hepatobiliary: No focal liver abnormality. Gallbladder is unremarkable. No biliary ductal dilatation. Pancreas: Similar appearance of mild increase caliber of the main duct. No inflammation. No mass Spleen: The spleen measures 9.7 x 6.3 by 15.6 cm (volume = 500 cm^3). New linear, bandlike area of hypoenhancement within the superior aspect of the spleen is identified, image 66/4 and is concerning for a area of splenic laceration. Adrenals/Urinary Tract: Normal  appearance of the adrenal glands. Right kidney cyst measures 1.7 cm. No mass or hydronephrosis. The urinary bladder is unremarkable. Stomach/Bowel: Stomach is within normal limits. Appendix appears normal. No evidence of bowel wall thickening, distention, or  inflammatory changes. Vascular/Lymphatic: Aortic atherosclerosis. No aneurysm. No abdominopelvic adenopathy identified. Reproductive: Status post hysterectomy. No adnexal masses. Other: There is a small to moderate volume of free fluid within the abdomen and pelvis. No discrete fluid collections. Musculoskeletal: Scoliosis and degenerative disc disease. No aggressive lytic or sclerotic bone lesions identified the at this time. IMPRESSION: 1. Stable changes of external beam radiation within the left lung without findings to suggest Lung cancer recurrence within the chest. 2. Significant increase in volume of the spleen. New bandlike area of low attenuation within the superior pole of spleen concerning for with age indeterminate splenic laceration. Alternatively, findings may reflect splenic infarct. 3. New small to moderate volume of free fluid within the abdomen and pelvis. Etiology is indeterminate but in the setting of splenic laceration could represent resolving hemoperitoneum. 4. Small pericardial effusion, new from previous study. 5. Healing left rib fractures. 6. Aortic Atherosclerosis (ICD10-I70.0) and Emphysema (ICD10-J43.9). Electronically Signed   By: Kerby Moors M.D.   On: 07/22/2019 10:40   DG Chest Port 1 View  Result Date: 07/23/2019 CLINICAL DATA:  Acute respiratory distress. EXAM: PORTABLE CHEST 1 VIEW COMPARISON:  CTA chest and chest x-ray from same day. FINDINGS: Unchanged left chest wall pacemaker. Unchanged right chest wall port catheter. Stable cardiomegaly. Normal pulmonary vascularity. Unchanged subtle increased density in the left upper lobe. No pleural effusion or pneumothorax. No acute osseous abnormality. IMPRESSION: 1. Unchanged subtle increased density in the left upper lobe, corresponding to faint ground-glass density seen on chest CT from earlier today, likely infectious or inflammatory. Electronically Signed   By: Titus Dubin M.D.   On: 07/06/2019 20:47   DG Chest Port 1  View  Result Date: 07/07/2019 CLINICAL DATA:  Dyspnea. EXAM: PORTABLE CHEST 1 VIEW COMPARISON:  July 30, 2019. FINDINGS: Stable cardiomegaly. No pneumothorax or significant pleural effusion is noted. Right internal jugular Port-A-Cath is unchanged in position. Left-sided pacemaker is unchanged. Lungs are clear. Old left rib fractures are noted. IMPRESSION: No active disease. Electronically Signed   By: Marijo Conception M.D.   On: 07/27/2019 10:07   DG Chest Portable 1 View  Result Date: 07/30/2019 CLINICAL DATA:  Weakness and diarrhea. History of lung carcinoma and renal carcinoma. EXAM: PORTABLE CHEST 1 VIEW COMPARISON:  CT of the chest on 07/22/2019 and prior chest x-ray on 06/18/2019 FINDINGS: Stable cardiac enlargement and appearance of dual-chamber pacemaker. Stable appearance of Port-A-Cath. Stable chronic atelectasis and volume loss at the left lung base. There is no evidence of pulmonary edema, consolidation, pneumothorax, nodule or pleural fluid. The visualized skeletal structures are unremarkable. IMPRESSION: Stable chest x-ray demonstrating stable cardiomegaly and chronic atelectasis at the left lung base. Electronically Signed   By: Aletta Edouard M.D.   On: 07/30/2019 13:40    ASSESSMENT: Pancytopenia secondary to MDS, history of neuroendocrine carcinoma of the lung and renal cell carcinoma.  PLAN:    1.  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  The complexity of patient's karyotype and the loss of 17p are indicative of a high-grade MDS with a poor prognosis.    Given patient's poor performance status, no chemotherapy is planned.  She is now transfusion dependent. 2.  Anemia: Patient's most recent hemoglobin  has improved to 8.0 with transfusion.  She does not require transfusion at this time, maintain hemoglobin greater than 7.0. 3.  Thrombocytopenia: Decreased, but approximately her baseline.  Monitor. 4.  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  Patient completed cycle 3 of carboplatinum and etoposide on September 19, 2017.  She also completed XRT. Given her persistent pancytopenia at that time, treatment was discontinued.  Patient's most recent CT scan on July 22, 2019 reviewed independently with no obvious evidence of recurrent or progressive disease.  5. Renal cell carcinoma of the right kidney: Stage T1a N0 M0.  Patient underwent partial nephrectomy on October 23, 2015.  Repeat CT scan as above.  Patient is no longer followed by urology. 6.  Indeterminate splenic laceration/splenic infarct: No intervention needed at this time.  Continue to monitor.  Consider repeat imaging in 6 to 8 weeks to ensure resolution. 7.  Hypotension: Case discussed with hospitalist.  Plan is to give patient pressors see if she improves. 8.  CODE STATUS: Patient is now DNR.  Also briefly discussed possible transitioning to comfort care with family which they are considering.  Appreciate palliative care input.  Appreciate consult, will follow.  Lloyd Huger, MD   08/04/2019 1:40 PM

## 2019-08-04 NOTE — Progress Notes (Addendum)
Assisted tele visit to patient with daughters.  Caroline Kevorkian M Terriyah Westra, RN   

## 2019-08-04 NOTE — Progress Notes (Signed)
Patient has not urinated all night, BP 91/59, HR 111, 36 RR. Bladder scanned patient and there was 955mL in bladder. Got an order from NP, [place] to in and out cath. I was able to get 652mL output.  Will continue to monitor.  Caroline Nichols

## 2019-08-04 NOTE — Progress Notes (Signed)
Subjective: Nurse reports patient with decreased oxygen saturation. Reports previously tachypnic but able to maintain oxygen saturations with 15L HFNC in place.  No urine output over 8 hours. Last bladder scan noon 241, now 326.  Objective: Caroline Nichols a 78 y.o.femalewith medical history significant ofchronic anemia, anxiety, rheumatoid arthritis, diabetes insipidus, GERD, splenic laceration, hyperlipidemia, hypertension.  Palliative care consult note with discussion with daughter noted.   Labs reviewed Current echo results noted    Assessment:  Bedside exam, patient minimally responsive with eyes to verbal stimulation.  (Previously reported as being able to verbalize some) Oxygen saturations 78% with non rebreather in place.  Respiratory rate 33-35 with decreased breath sounds in the bases.  Bedside monitoring cardiac monitoring alarming ST elevation Lead II and AVF.. 12 Lead also noted as well as v3,v4,v5,and v6 at 0.1 mm, no reciprocal changes in I or AVL and no other ST abnormalities appreciated. Patient on levophed for BP support.  ABG pending  Plan:    Acute respiratory failure BIPAP and check ABG in one hour CXR check for pulmonary edema.  IV fluids initially stopped then changed to LR at 75 secondary to evidence of acute kidney injury, no pulmonary edema on x-ray and creatinine up to 1.32 from 0.82. BNP 771  Urinary retention Place foley  ACS Troponin lower than previous. Continue to monitor for changes on telemetry   Anemia HGB stable at 8.0   :

## 2019-08-04 NOTE — Consult Note (Signed)
NAME: Caroline Nichols  DOB: 1941/05/19  MRN: [id number]  Date/Time: 08/04/2019 11:19 AM  REQUESTING PROVIDER: Dr.PAtel Subjective:  REASON FOR CONSULT: ? sepsis ? Caroline Nichols is a 78 y.o. female with a history of neuroendocrine malignancy of the lung, renal carcinoma S/p partial nephrectomy(rt), Myelodysplastic syndrome  , anemia transfusion dependent, thrombocytopenia, rheumatoid arthritis on hydroxychloroquine,recent prednisone taper is admitted with weakness, fatigue, diarrhea  Pt came to the Ed on 12/25 with similar c//o and was discharged from, the ED. She went to her PCP's office for lab work yesterday and as she was very pale/weak she was referred to ED thru EMS. In the ED she was alert and oriented and was found to have respiratory distress eventhough she  did not complain of it.  Vitals showed a temp of 98.3, 151/52, HR 94, RR 42, pulse ox 92% CTA Chest showed no PE, post radiation changes in the left perihilar area, cardiomegaly and possible splenic infarct( previous CT abdomen from 12/17 had shown a possible splenic laceration) Hb was 7.4, PLT 77, - she received a unit of PRBC Today she decompensated with low BP, tachypnea and encephalopathy and was started on ceftriaxone and transferred to ICU. I am asked to see her for possible sepsis Pt is on pressor She does not give history Daughter at bed side  Recent Procedure- Bone marrow biopsy on 07/05/19 Blood transfusion 07/28/19 Sick contacts-No Travel-No Antibiotic use-No Steroid use on 07/16/19 - tapering dose immune suppressants -hydroxychloroquine Device- pacemaker/PORT  Pt is follwoed by Dr.Finnegan and last saw him on 07/22/19   Past Medical History:  Diagnosis Date  . Anemia 02/2013   F/U with Dr. Grayland Ormond for Feraheme injections  . Anxiety   . Arthritis    Possible RA - Follow up appt with Dr. Jefm Bryant  . Cellulitis of arm, right   . Chest pain 03/26/2013  . Diabetes insipidus ([place])    On desmopressin  . GERD  (gastroesophageal reflux disease)   . H/O seasonal allergies   . H/O transfusion of packed red blood cells 02/2013   3 units PRBC for severe anemia 02/2013  . Herniated disc   . History of kidney stones   . Hyperlipidemia   . Hypertension   . Hyperthyroidism    s/p radiation therapy, now hypothyroidism  . IBS (irritable bowel syndrome)   . Lung cancer ([place]) 06/2017   left  . Migraine   . Pacemaker    a. s/p successful implantation of a Medtronic CapSureFix Novus MRIT SureScan serial # [id number] on 06/28/16 by Dr. Caryl Comes for sinus node dysfunction.  . Pancreatic cyst   . Paroxysmal A-fib ([place])   . Renal cell cancer, right ([place]) 10/23/2015   Right partial nephrectomy.    Past Surgical History:  Procedure Laterality Date  . ABDOMINAL HYSTERECTOMY    . BREAST EXCISIONAL BIOPSY Right 2005   neg  . BREAST SURGERY  1990s   Biopsy  . CERVICAL SPINE SURGERY     Bone fusion  . COLONOSCOPY  2008?   [place]  . COLONOSCOPY WITH PROPOFOL N/A 10/15/2016   Procedure: COLONOSCOPY WITH PROPOFOL;  Surgeon: Robert Bellow, MD;  Location: [facility] ENDOSCOPY;  Service: Endoscopy;  Laterality: N/A;  . EP IMPLANTABLE DEVICE N/A 06/28/2016   Procedure: Pacemaker Implant;  Surgeon: Deboraha Sprang, MD;  Location: [place] CV LAB;  Service: Cardiovascular;  Laterality: N/A;  . HAMMER TOE SURGERY    . HERNIA REPAIR Left    Inguinal Hernia Repair  .  PORTA CATH INSERTION N/A 07/24/2017   Procedure: PORTA CATH INSERTION;  Surgeon: Algernon Huxley, MD;  Location: [place] CV LAB;  Service: Cardiovascular;  Laterality: N/A;  . ROBOTIC ASSITED PARTIAL NEPHRECTOMY Right 10/23/2015   Procedure: ROBOTIC ASSITED PARTIAL NEPHRECTOMY with intraop ultrasound;  Surgeon: Hollice Espy, MD;  Location: [facility] ORS;  Service: Urology;  Laterality: Right;  . TUBAL LIGATION      Social History   Socioeconomic History  . Marital status: Widowed    Spouse name: Not on file  . Number of children: 4  . Years of  education: 4  . Highest education level: Not on file  Occupational History  . Occupation: Charity fundraiser    Comment: Retired  . 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 Smoking status: Former Smoker    Packs/day: 0.50    Years: 30.00    Pack years: 15.00    Types: Cigarettes    Quit date: 12/03/2012    Years since quitting: 6.6  . Smokeless tobacco: Never Used  Substance and Sexual Activity  . Alcohol use: No    Alcohol/week: 0.0 standard drinks  . Drug use: No  . Sexual activity: Never  Other Topics Concern  . Not on file  Social History Narrative   Caroline Nichols was born and reared in [place]. She is a widow since 7. She was married for 48 years. They had 4 children (daughters). She is currently leaving at [facility] since June. 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 She enjoys shopping. She also enjoys music, word puzzles and she loves spending time with her family. She loves attending church. One of her favorite things is wearing hats. She absolutely loves hats!   Social Determinants of Health   Financial Resource Strain:   . Difficulty of Paying Living Expenses: Not on file  Food Insecurity:   . Worried About Charity fundraiser in the Last Year: Not on file  . Ran Out of Food in the Last Year: Not on file  Transportation Needs:   . Lack of Transportation (Medical): Not on file  . Lack of Transportation (Non-Medical): Not on file  Physical Activity:   . Days of Exercise per Week: Not on file  . Minutes of Exercise per Session: Not on file  Stress:   . Feeling of Stress : Not on file  Social Connections:   . Frequency of Communication with Friends and Family: Not on file  . Frequency of Social Gatherings with Friends and Family: Not on file  . Attends Religious Services: Not on file  . Active Member of Clubs or Organizations: Not on file  .  Attends Archivist Meetings: Not on file  . Marital Status: Not on file  Intimate Partner Violence:   . Fear of Current or Ex-Partner: Not on file  . Emotionally Abused: Not on file  . Physically Abused: Not on file  . Sexually Abused: Not on file    Family History  Problem Relation Age of Onset  . Heart disease Mother   . Heart disease Father   . Diabetes Brother   . Kidney disease Brother   . Stroke Daughter        due to medication reaction  . Breast cancer Cousin   . Prostate cancer Neg Hx   . Hematuria Neg Hx  Allergies  Allergen Reactions  . Metrizamide     Other reaction(s): Other (See Comments)  . Adhesive [Tape] Other (See Comments)    "irritate skin"  . Augmentin [Amoxicillin-Pot Clavulanate] Diarrhea    Severe diarrhea  . Codeine Nausea Only  . Morphine And Related Other (See Comments)    Shut down her organs  . Other     Skin irritation  . Tetanus Toxoid Other (See Comments)    REACTION: ARM SWELLING,REDNESS  . Tramadol Nausea And Vomiting   Risks Recent  Procedure- yes Bone marrow biopsy on 07/05/19 Blood transfusion 07/28/19 Sick contacts-No Travel-No Antibiotic use-No Steroid use on 07/16/19  /immune suppressants -hydroxychloroquine Device- pacemaker/PORT  ? Current Facility-Administered Medications  Medication Dose Route Frequency Provider Last Rate Last Admin  . 0.9 %  sodium chloride infusion   Intravenous Continuous Fritzi Mandes, MD 75 mL/hr at 08/04/19 1046 New Bag at 08/04/19 1046  . acetaminophen (TYLENOL) tablet 650 mg  650 mg Oral Q6H PRN Ralene Muskrat B, MD   650 mg at 08/04/19 0933  . albuterol (PROVENTIL) (2.5 MG/3ML) 0.083% nebulizer solution 2.5 mg  2.5 mg Inhalation Q6H PRN Sreenath, Sudheer B, MD      . ALPRAZolam Duanne Moron) tablet 0.25 mg  0.25 mg Oral BID PRN Priscella Mann, Sudheer B, MD      . ascorbic acid (VITAMIN C) tablet 500 mg  500 mg Oral Daily Priscella Mann, Sudheer B, MD   500 mg at 08/04/19 0858  . brimonidine  (ALPHAGAN) 0.2 % ophthalmic solution 1 drop  1 drop Both Eyes BID Ralene Muskrat B, MD   1 drop at 08/04/19 0919  . calcium-vitamin D (OSCAL WITH D) 500-200 MG-UNIT per tablet 1 tablet  1 tablet Oral Daily Ralene Muskrat B, MD   1 tablet at 08/04/19 0910  . carvedilol (COREG) tablet 9.375 mg  9.375 mg Oral BID WC Ralene Muskrat B, MD   9.375 mg at 08/04/19 0909  . Chlorhexidine Gluconate Cloth 2 % PADS 6 each  6 each Topical Daily Sidney Ace, MD   6 each at 08/04/19 [phone]  . cholecalciferol (VITAMIN D3) tablet 1,000 Units  1,000 Units Oral Daily Priscella Mann, Sudheer B, MD   1,000 Units at 08/04/19 0910  . dorzolamide (TRUSOPT) 2 % ophthalmic solution 1 drop  1 drop Both Eyes BID Ralene Muskrat B, MD   1 drop at 08/04/19 [phone]  . ferrous sulfate tablet 325 mg  325 mg Oral Q breakfast Ralene Muskrat B, MD   325 mg at 08/04/19 0911  . folic acid (FOLVITE) tablet 1 mg  1 mg Oral Daily Sreenath, Sudheer B, MD   1 mg at 08/04/19 0856  . gabapentin (NEURONTIN) capsule 300 mg  300 mg Oral TID Ralene Muskrat B, MD   300 mg at 08/04/19 0856  . heparin lock flush 100 unit/mL  250 Units Intracatheter PRN Lloyd Huger, MD      . heparin lock flush 100 unit/mL  500 Units Intracatheter Daily PRN Lloyd Huger, MD      . heparin lock flush 100 unit/mL  250 Units Intracatheter PRN Lloyd Huger, MD      . HYDROcodone-acetaminophen (NORCO) 10-325 MG per tablet 1 tablet  1 tablet Oral Q6H PRN Ralene Muskrat B, MD      . hydroxychloroquine (PLAQUENIL) tablet 400 mg  400 mg Oral Daily Ralene Muskrat B, MD   400 mg at 08/04/19 0858  . latanoprost (XALATAN) 0.005 % ophthalmic solution 1 drop  1 drop Both Eyes QHS Ralene Muskrat B, MD   1 drop at 07/21/2019 2100  . levothyroxine (SYNTHROID) tablet 100 mcg  100 mcg Oral QAC breakfast Sreenath, Sudheer B, MD      . losartan (COZAAR) tablet 100 mg  100 mg Oral QHS Sreenath, Sudheer B, MD      . mirtazapine (REMERON) tablet 15 mg   15 mg Oral QHS Sreenath, Sudheer B, MD      . nitroGLYCERIN (NITROSTAT) SL tablet 0.4 mg  0.4 mg Sublingual Q5 Min x 3 PRN Sreenath, Sudheer B, MD      . ondansetron (ZOFRAN-ODT) disintegrating tablet 4 mg  4 mg Oral Q8H PRN Sreenath, Sudheer B, MD      . oxybutynin (DITROPAN-XL) 24 hr tablet 10 mg  10 mg Oral QHS Sreenath, Sudheer B, MD      . pantoprazole (PROTONIX) EC tablet 40 mg  40 mg Oral BID Ralene Muskrat B, MD   40 mg at 08/04/19 0857  . PARoxetine (PAXIL) tablet 10 mg  10 mg Oral QPC supper Sreenath, Sudheer B, MD      . saccharomyces boulardii (FLORASTOR) capsule 250 mg  250 mg Oral BID Ralene Muskrat B, MD   250 mg at 08/04/19 0859  . sodium chloride 0.9 % bolus 250 mL  250 mL Intravenous Once Fritzi Mandes, MD      . sodium chloride flush (NS) 0.9 % injection 10 mL  10 mL Intracatheter PRN Lloyd Huger, MD      . sodium chloride flush (NS) 0.9 % injection 10 mL  10 mL Intracatheter PRN Lloyd Huger, MD      . sodium chloride flush (NS) 0.9 % injection 3 mL  3 mL Intracatheter PRN Lloyd Huger, MD      . sodium chloride flush (NS) 0.9 % injection 3 mL  3 mL Intracatheter PRN Lloyd Huger, MD      . thiamine tablet 100 mg  100 mg Oral Daily Ralene Muskrat B, MD   100 mg at 08/04/19 0856  . traZODone (DESYREL) tablet 50 mg  50 mg Oral QHS PRN Ralene Muskrat B, MD         Abtx:  Anti-infectives (From admission, onward)   Start     Dose/Rate Route Frequency Ordered Stop   08/04/19 1100  cefTRIAXone (ROCEPHIN) 1 g in sodium chloride 0.9 % 100 mL IVPB  Status:  Discontinued     1 g 200 mL/hr over 30 Minutes Intravenous Every 24 hours 08/04/19 1041 08/04/19 1117   07/09/2019 1500  hydroxychloroquine (PLAQUENIL) tablet 400 mg     400 mg Oral Daily 07/28/2019 1455        REVIEW OF SYSTEMS:  NA Objective:  VITALS:  BP (!) 80/59   Pulse (!) 122   Temp 98 F (36.7 C) (Oral)   Resp (!) 44   Ht '5\' 2"'  (1.575 m)   Wt 60.8 kg   SpO2 99%   BMI  24.51 kg/m  PHYSICAL EXAM:  General: obtunded, pale, ashen and grey, tachypnea monosyllable but not comprehensible Head: Normocephalic, without obvious abnormality, atraumatic. Eyes: Conjunctivae clear, anicteric sclerae. Pupils are equal ENT tongue /lips grey Neck: Supple, symmetrical, no adenopathy, thyroid: non tender no carotid bruit and no JVD. Back: did not examine Lungs: b/la ir entry- crepts bases Heart: s1s2 Abdomen: Soft, not distended. Bowel sounds normal. No masses Minimal tenderness Extremities: atraumatic, no cyanosis. No edema. No clubbing C/o severe pain on lifting  her foot Skin: No rashes or lesions. Or bruising Lymph: Cervical, supraclavicular normal. Neurologic:spontaneously moves her limbs Pertinent Labs Lab Results CBC    Component Value Date/Time   WBC 5.0 08/04/2019 0500   RBC 2.66 (L) 08/04/2019 0500   HGB 8.0 (L) 08/04/2019 0826   HGB 11.7 12/06/2015 1035   HCT 24.2 (L) 08/04/2019 0500   HCT 36.9 12/06/2015 1035   PLT 61 (L) 08/04/2019 0500   PLT 122 (L) 12/06/2015 1035   MCV 91.0 08/04/2019 0500   MCV 95 12/06/2015 1035   MCV 89 07/18/2014 1205   MCH 28.6 08/04/2019 0500   MCHC 31.4 08/04/2019 0500   RDW 17.4 (H) 08/04/2019 0500   RDW 16.8 (H) 12/06/2015 1035   RDW 14.9 (H) 07/18/2014 1205   LYMPHSABS 1.4 07/20/2019 0953   LYMPHSABS 0.8 (L) 09/02/2013 1028   MONOABS 2.0 (H) 07/15/2019 0953   MONOABS 1.1 (H) 09/02/2013 1028   EOSABS 0.0 07/26/2019 0953   EOSABS 0.0 09/02/2013 1028   BASOSABS 0.3 (H) 07/26/2019 0953   BASOSABS 0.0 09/02/2013 1028    CMP Latest Ref Rng & Units 08/04/2019 07/19/2019 07/30/2019  Glucose 70 - 99 mg/dL 104(H) 114(H) 126(H)  BUN 8 - 23 mg/dL 24(H) 23 13  Creatinine 0.44 - 1.00 mg/dL 0.82 0.80 1.07(H)  Sodium 135 - 145 mmol/L 141 138 135  Potassium 3.5 - 5.1 mmol/L 4.1 3.5 3.2(L)  Chloride 98 - 111 mmol/L 105 103 100  CO2 22 - 32 mmol/L 26 23 21(L)  Calcium 8.9 - 10.3 mg/dL 8.4(L) 8.8(L) 8.7(L)  Total  Protein 6.5 - 8.1 g/dL - - 7.7  Total Bilirubin 0.3 - 1.2 mg/dL - - 1.2  Alkaline Phos 38 - 126 U/L - - 85  AST 15 - 41 U/L - - 22  ALT 0 - 44 U/L - - 14      Microbiology: Recent Results (from the past 240 hour(s))  Respiratory Panel by RT PCR (Flu A&B, Covid) - Nasopharyngeal Swab     Status: None   Collection Time: 07/27/2019  9:47 AM   Specimen: Nasopharyngeal Swab  Result Value Ref Range Status   SARS Coronavirus 2 by RT PCR NEGATIVE NEGATIVE Final    Comment: (NOTE) SARS-CoV-2 target nucleic acids are NOT DETECTED. The SARS-CoV-2 RNA is generally detectable in upper respiratoy specimens during the acute phase of infection. The lowest concentration of SARS-CoV-2 viral copies this assay can detect is 131 copies/mL. A negative result does not preclude SARS-Cov-2 infection and should not be used as the sole basis for treatment or other patient management decisions. A negative result may occur with  improper specimen collection/handling, submission of specimen other than nasopharyngeal swab, presence of viral mutation(s) within the areas targeted by this assay, and inadequate number of viral copies (<131 copies/mL). A negative result must be combined with clinical observations, patient history, and epidemiological information. The expected result is Negative. Fact Sheet for Patients:  PinkCheek.be Fact Sheet for Healthcare Providers:  GravelBags.it This test is not yet ap proved or cleared by the [place] FDA and  has been authorized for detection and/or diagnosis of SARS-CoV-2 by FDA under an Emergency Use Authorization (EUA). This EUA will remain  in effect (meaning this test can be used) for the duration of the COVID-19 declaration under Section 564(b)(1) of the Act, 21 U.S.C. section 360bbb-3(b)(1), unless the authorization is terminated or revoked sooner.    Influenza A by PCR NEGATIVE NEGATIVE Final    Influenza B by PCR  NEGATIVE NEGATIVE Final    Comment: (NOTE) The Xpert Xpress SARS-CoV-2/FLU/RSV assay is intended as an aid in  the diagnosis of influenza from Nasopharyngeal swab specimens and  should not be used as a sole basis for treatment. Nasal washings and  aspirates are unacceptable for Xpert Xpress SARS-CoV-2/FLU/RSV  testing. Fact Sheet for Patients: PinkCheek.be Fact Sheet for Healthcare Providers: GravelBags.it This test is not yet approved or cleared by the [place] FDA and  has been authorized for detection and/or diagnosis of SARS-CoV-2 by  FDA under an Emergency Use Authorization (EUA). This EUA will remain  in effect (meaning this test can be used) for the duration of the  Covid-19 declaration under Section 564(b)(1) of the Act, 21  U.S.C. section 360bbb-3(b)(1), unless the authorization is  terminated or revoked. Performed at [facility], [address] [place], [place] [address]     IMAGING RESULTS:  I have personally reviewed the films ? Impression/Recommendation ?78 y.o. female with a history of neuroendocrine malignancy of the lung, renal carcinoma S/p partial nephrectomy(rt), Myelodysplastic syndrome  , anemia transfusion dependent, thrombocytopenia, rheumatoid arthritis on hydroxychloroquine,recent prednisone taper is admitted with weakness, fatigue, diarrhea    Tachypnea, hypotension, anemia , fatigue , diarrhea - SIRS like presentation This could all be from the severe myelodysplastic syndrome- ( except diarrhea)  R/o infection- has PORT/pacemaker-blood culture sent  ( will give vanco + cefepime) R/o cdiff- (less likely as diarrhea is chronic and no antibiotics in many months) R/o adrenal insufficiency due to recent high dose steroid taper ( but for short course only) R/o methemoglobulinemia due to the ashen -grey color  And being on hydroxychloroquine ( less likely as she has  been on it for long ) ABG done has Normal metHb     Neuro-endocrine malignancy of the lung -S/pchemo in feb 2019/ S/p XRT to lung  panycytopenia with profound anemia and thrombocytopenia due to MDS  Rheumatoid arthritis- on hydroxychloroquine- hold for now  H/o renal cell carcinoma  lap partial nephrectomy   Hyperthyroidism s/p radiation- and now hypothyroid and on synthroid  Poor Prognosis  Discussed the management with her daughter and the care team

## 2019-08-04 NOTE — Progress Notes (Signed)
Pt daughter Caroline Nichols in room. Has spoken with other sisters and they do not wish for ABG blood work to be completed at this time. [name] NP was present for this conversation, and spoke with daughter about comfort measures. They do not wish to turn off the BiPAP or Levophed until all of family is present tomorrow.  Per family they would not like to escalate care and all daughters would like to have a family meeting with patient at 91 AM.  Spoke with charge nurse [name] about pt family wishes, and will also notify house supervisor so we can coordinate with the family. They are aware that pt may not make it to the morning. In that case daughter Caroline Nichols, who lives closes,t would like to be called if pt status declines further. She was given phone number to call if she has any concerns over night. She also took pt belongings home with her.

## 2019-08-04 NOTE — Progress Notes (Signed)
OT Cancellation Note  Patient Details Name: Caroline Nichols MRN: [id number] DOB: 11/03/1940   Cancelled Treatment:    Reason Eval/Treat Not Completed: Medical issues which prohibited therapy. Thank you for the OT consult. Order received and chart reviewed. Pt noted to have resting HR 126 and RR in the upper 30's to mid 40's.  Spoke to physical therapist who informed this Pryor Curia that MD agreed to hold therapy until pt is more medically appropriate. Will follow remotely and initiate services as appropriate.   Shara Blazing, M.S., OTR/L Ascom: [phone] 08/04/19, 9:55 AM

## 2019-08-04 NOTE — Progress Notes (Signed)
Initial Nutrition Assessment  DOCUMENTATION CODES:   Not applicable  INTERVENTION:  -Boost Breeze TID with meals, each supplement provides 250 kcal and 9 grams of protein  -Ensure Enlive po BID, each supplement provides 350 kcal and 20 grams of protein  -Encourage po intake  NUTRITION DIAGNOSIS:   Increased nutrient needs related to cancer and cancer related treatments(myelodysplastic syndrome; stage IIb large cell neuroendocrine tumor) as evidenced by per patient/family report(per chart review, pt reports chronic fatigue, diarrhea, and poor po intake).  GOAL:   Patient will meet greater than or equal to 90% of their needs   MONITOR:   PO intake, I & O's, Supplement acceptance, Labs, Weight trends  REASON FOR ASSESSMENT:   Consult Assessment of nutrition requirement/status  ASSESSMENT:  RD working remotely.  78 year old female with past medical history of chronic anemia, anxiety, RA, diabetes insipidus, GERD, splenic laceration, HLD, HTN, h/o high risk myelodysplastic syndrome not amenable to chemotherapy and stage IIb large cell neuroendocrine tumor s/p 3 cycles of combination chemotherapy and XRT which was discontinued due to persistent pancytopenia. Patient discharged from ED on 12/25 and returns with worsening of the same chronic symptoms of fatigue, dyspnea, diarrhea, and poor po intake.  Patient admitted for acute diarrhea likely related to underlying malignancy verses infectious causes.  Patient seen by PCT while in ED. Per notes, pt reports being transfusion dependent which allows her to feel better for a brief amount of time, but then has subsequent decline. Patient endorses becoming progressively weak over the past several weeks and feeling generally miserable. Patient has 4 daughters that are involved in her care and wishes to speak with them prior to deciding goals of care.   RD attempted to contact patient via inpatient room this morning, unfortunately she did not  answer. Unable to obtain nutrition history at this time. Patient is on a regular diet, no meals documented for review. RD will continue to monitor for po intake and provide Ensure as well as Boost Breeze to aid with calorie/protein needs.  Current wt 60.8 kg (133.76 lb) Per wt history, stable 62.8 kg - 60.8 kg over the past month.  Medications reviewed and include: Vit C, Oscal with D, Coreg, Vit D3, Remeron, Protonix, Thiamine, Rocephin  Labs: CBGs 130 x 1 this admission, BUN 24 (H), Hgb 8.0 (L)  NUTRITION - FOCUSED PHYSICAL EXAM: Unable to complete at this time, RD working remotely.  Diet Order:   Diet Order            Diet regular Room service appropriate? Yes; Fluid consistency: Thin  Diet effective now              EDUCATION NEEDS:   No education needs have been identified at this time  Skin:  Skin Assessment: Reviewed RN Assessment  Last BM:  12/27  Height:   Ht Readings from Last 1 Encounters:  07/07/2019 5\' 2"  (1.575 m)    Weight:   Wt Readings from Last 1 Encounters:  07/25/2019 60.8 kg    Ideal Body Weight:  50 kg  BMI:  Body mass index is 24.51 kg/m.  Estimated Nutritional Needs:   Kcal:  1600-1800  Protein:  80-90  Fluid:  >/= 1.5 L/day   Lajuan Lines, RD, LDN Clinical Nutrition Office [phone] After Hours/Weekend Pager: [phone]

## 2019-08-04 NOTE — Progress Notes (Signed)
Caroline Nichols  [phone] [phone]   Name: Caroline Nichols Date: 08/04/2019 MRN: [id number]  DOB: [birth year]  Patient Care Team: [name], MD as PCP - General (Internal Medicine) [name], MD (Internal Medicine) [name] [name], MD (General Surgery) Lloyd Huger, MD as Consulting Physician (Oncology) Noreene Filbert, MD as Referring Physician (Radiation Oncology)    REASON FOR CONSULTATION: Caroline Nichols is a 78 y.o. 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  Patient has multiple recent ER visits for weakness, poor oral intake, and diarrhea.  She was admitted for same on 07/08/2019.  Palliative care was consulted to help address goals and manage ongoing symptoms.  CODE STATUS: DNR  PAST MEDICAL HISTORY: Past Medical History:  Diagnosis Date  . Anemia 02/2013   F/U with Dr. Grayland Ormond for Feraheme injections  . Anxiety   . Arthritis    Possible RA - Follow up appt with Dr. Jefm Bryant  . Cellulitis of arm, right   . Chest pain 03/26/2013  . Diabetes insipidus ([place])    On desmopressin  . GERD (gastroesophageal reflux disease)   . H/O seasonal allergies   . H/O transfusion of packed red blood cells 02/2013   3 units PRBC for severe anemia 02/2013  . Herniated disc   . History of kidney stones   . Hyperlipidemia   . Hypertension   . Hyperthyroidism    s/p radiation therapy, now hypothyroidism  . IBS (irritable bowel syndrome)   . Lung cancer ([place]) 06/2017   left  . Migraine   . Pacemaker    a. s/p successful implantation of a Medtronic CapSureFix Novus MRIT SureScan serial # [id number] on 06/28/16 by Dr. Caryl Comes for sinus node dysfunction.  . Pancreatic cyst   .  Paroxysmal A-fib ([place])   . Renal cell cancer, right ([place]) 10/23/2015   Right partial nephrectomy.    PAST SURGICAL HISTORY:  Past Surgical History:  Procedure Laterality Date  . ABDOMINAL HYSTERECTOMY    . BREAST EXCISIONAL BIOPSY Right 2005   neg  . BREAST SURGERY  1990s   Biopsy  . CERVICAL SPINE SURGERY     Bone fusion  . COLONOSCOPY  2008?   [place]  . COLONOSCOPY WITH PROPOFOL N/A 10/15/2016   Procedure: COLONOSCOPY WITH PROPOFOL;  Surgeon: Robert Bellow, MD;  Location: [facility] ENDOSCOPY;  Service: Endoscopy;  Laterality: N/A;  . EP IMPLANTABLE DEVICE N/A 06/28/2016   Procedure: Pacemaker Implant;  Surgeon: Deboraha Sprang, MD;  Location: [place] CV LAB;  Service: Cardiovascular;  Laterality: N/A;  . HAMMER TOE SURGERY    . HERNIA REPAIR Left    Inguinal Hernia Repair  . PORTA CATH INSERTION N/A 07/24/2017   Procedure: PORTA CATH INSERTION;  Surgeon: Algernon Huxley, MD;  Location: [place] [facility] LAB;  Service: Cardiovascular;  Laterality: N/A;  . ROBOTIC ASSITED PARTIAL NEPHRECTOMY Right 10/23/2015   Procedure: ROBOTIC ASSITED PARTIAL NEPHRECTOMY with intraop ultrasound;  Surgeon: Hollice Espy, MD;  Location: [facility] ORS;  Service: Urology;  Laterality: Right;  . TUBAL LIGATION      HEMATOLOGY/ONCOLOGY HISTORY:  Oncology History Overview Note  Stage IIb left lung large cell neuroendocrine carcinoma: Biopsy confirms second primary.  Patient completed cycle 3 of carboplatinum and etoposide on September 19, 2017.  She also completed XRT.  Given her persistent pancytopenia, treatment was discontinued.  Restaging PET scan from January 12, 2018 reviewed independently and reported as above with no findings to suggest recurrent or metastatic disease     Malignant neoplasm of lung, unspecified laterality, unspecified part of lung ([place])  07/16/2017 Initial Diagnosis   Neuroendocrine carcinoma of lung ([place])     ALLERGIES:  is allergic to metrizamide; adhesive [tape]; augmentin  [amoxicillin-pot clavulanate]; codeine; morphine and related; other; tetanus toxoid; and tramadol.  MEDICATIONS:  Current Facility-Administered Medications  Medication Dose Route Frequency Provider Last Rate Last Admin  . 0.9 %  sodium chloride infusion   Intravenous Continuous Fritzi Mandes, MD 50 mL/hr at 08/04/19 1345 Rate Change at 08/04/19 1345  . acetaminophen (TYLENOL) tablet 650 mg  650 mg Oral Q6H PRN Ralene Muskrat B, MD   650 mg at 08/04/19 0933  . albuterol (PROVENTIL) (2.5 MG/3ML) 0.083% nebulizer solution 2.5 mg  2.5 mg Inhalation Q6H PRN Sreenath, Sudheer B, MD      . ALPRAZolam Duanne Moron) tablet 0.25 mg  0.25 mg Oral BID PRN Priscella Mann, Sudheer B, MD      . ascorbic acid (VITAMIN C) tablet 500 mg  500 mg Oral Daily Priscella Mann, Sudheer B, MD   500 mg at 08/04/19 0858  . brimonidine (ALPHAGAN) 0.2 % ophthalmic solution 1 drop  1 drop Both Eyes BID Ralene Muskrat B, MD   1 drop at 08/04/19 0919  . calcium-vitamin D (OSCAL WITH D) 500-200 MG-UNIT per tablet 1 tablet  1 tablet Oral Daily Ralene Muskrat B, MD   1 tablet at 08/04/19 0910  . ceFEPIme (MAXIPIME) 2 g in sodium chloride 0.9 % 100 mL IVPB  2 g Intravenous Q12H Gerald Dexter, RPH 200 mL/hr at 08/04/19 1632 2 g at 08/04/19 1632  . Chlorhexidine Gluconate Cloth 2 % PADS 6 each  6 each Topical Daily Sidney Ace, MD   6 each at 08/04/19 [phone]  . cholecalciferol (VITAMIN D3) tablet 1,000 Units  1,000 Units Oral Daily Priscella Mann, Sudheer B, MD   1,000 Units at 08/04/19 0910  . dorzolamide (TRUSOPT) 2 % ophthalmic solution 1 drop  1 drop Both Eyes BID Ralene Muskrat B, MD   1 drop at 08/04/19 [phone]  . feeding supplement (BOOST / RESOURCE BREEZE) liquid 1 Container  1 Container Oral TID WC Fritzi Mandes, MD      . feeding supplement (ENSURE ENLIVE) (ENSURE ENLIVE) liquid 237 mL  237 mL Oral BID BM Fritzi Mandes, MD      . ferrous sulfate tablet 325 mg  325 mg Oral Q breakfast Ralene Muskrat B, MD   325 mg at 08/04/19 0911    . folic acid (FOLVITE) tablet 1 mg  1 mg Oral Daily Sreenath, Sudheer B, MD   1 mg at 08/04/19 0856  . gabapentin (NEURONTIN) capsule 300 mg  300 mg Oral TID Ralene Muskrat B, MD   300 mg at 08/04/19 0856  . heparin lock flush 100 unit/mL  250 Units Intracatheter PRN Lloyd Huger, MD      . heparin lock flush 100 unit/mL  500 Units Intracatheter Daily PRN Lloyd Huger, MD      . heparin lock flush 100 unit/mL  250 Units Intracatheter PRN Lloyd Huger, MD      . HYDROcodone-acetaminophen (NORCO) 10-325 MG per tablet 1 tablet  1 tablet Oral Q6H PRN Sreenath, Sudheer B, MD      . latanoprost (XALATAN) 0.005 %  ophthalmic solution 1 drop  1 drop Both Eyes QHS Ralene Muskrat B, MD   1 drop at 07/09/2019 2100  . levothyroxine (SYNTHROID) tablet 100 mcg  100 mcg Oral QAC breakfast Sreenath, Sudheer B, MD      . mirtazapine (REMERON) tablet 15 mg  15 mg Oral QHS Sreenath, Sudheer B, MD      . nitroGLYCERIN (NITROSTAT) SL tablet 0.4 mg  0.4 mg Sublingual Q5 Min x 3 PRN Sreenath, Sudheer B, MD      . norepinephrine (LEVOPHED) 3m in 2580mpremix infusion  0-40 mcg/min Intravenous Titrated PaFritzi MandesMD 7.5 mL/hr at 08/04/19 1327 2 mcg/min at 08/04/19 1327  . ondansetron (ZOFRAN-ODT) disintegrating tablet 4 mg  4 mg Oral Q8H PRN Sreenath, Sudheer B, MD      . oxybutynin (DITROPAN-XL) 24 hr tablet 10 mg  10 mg Oral QHS Sreenath, Sudheer B, MD      . pantoprazole (PROTONIX) EC tablet 40 mg  40 mg Oral BID SrRalene Muskrat, MD   40 mg at 08/04/19 0857  . PARoxetine (PAXIL) tablet 10 mg  10 mg Oral QPC supper Sreenath, Sudheer B, MD      . saccharomyces boulardii (FLORASTOR) capsule 250 mg  250 mg Oral BID SrRalene Muskrat, MD   250 mg at 08/04/19 0859  . sodium chloride flush (NS) 0.9 % injection 10 mL  10 mL Intracatheter PRN FiLloyd HugerMD      . sodium chloride flush (NS) 0.9 % injection 10 mL  10 mL Intracatheter PRN FiLloyd HugerMD      . sodium chloride  flush (NS) 0.9 % injection 3 mL  3 mL Intracatheter PRN FiLloyd HugerMD      . sodium chloride flush (NS) 0.9 % injection 3 mL  3 mL Intracatheter PRN FiLloyd HugerMD      . thiamine tablet 100 mg  100 mg Oral Daily SrRalene Muskrat, MD   100 mg at 08/04/19 0856  . traZODone (DESYREL) tablet 50 mg  50 mg Oral QHS PRN SrRalene Muskrat, MD      . [SDerrill MemoN 122021-01-02vancomycin (VANCOCIN) IVPB 1000 mg/200 mL premix  1,000 mg Intravenous Q24H MoGerald DexterRPOceans [facility]    . vancomycin (VANCOREADY) IVPB 1500 mg/300 mL  1,500 mg Intravenous Once MoGerald DexterRPH 150 mL/hr at 08/04/19 1634 1,500 mg at 08/04/19 1634    VITAL SIGNS: BP (!) 91/56   Pulse 82   Temp 99.1 F (37.3 C) (Oral)   Resp (!) 50   Ht _0  (1.575 m)   Wt 134 lb (60.8 kg)   SpO2 98%   BMI 24.51 kg/m  Filed Weights   07/30/2019 0947  Weight: 134 lb (60.8 kg)    Estimated body mass index is 24.51 kg/m as calculated from the following:   Height as of this encounter: _1  (1.575 m).   Weight as of this encounter: 134 lb (60.8 kg).  LABS: CBC:    Component Value Date/Time   WBC 5.0 08/04/2019 0500   HGB 8.0 (L) 08/04/2019 0826   HGB 11.7 12/06/2015 1035   HCT 24.2 (L) 08/04/2019 0500   HCT 36.9 12/06/2015 1035   PLT 61 (L) 08/04/2019 0500   PLT 122 (L) 12/06/2015 1035   MCV 91.0 08/04/2019 0500   MCV 95 12/06/2015 1035   MCV 89 07/18/2014 1205   NEUTROABS 3.0 08/01/2019 0953   NEUTROABS 1  03/02/2014 0000   NEUTROABS 0.6 (L) 09/02/2013 1028   LYMPHSABS 1.4 07/31/2019 0953   LYMPHSABS 0.8 (L) 09/02/2013 1028   MONOABS 2.0 (H) 08/01/2019 0953   MONOABS 1.1 (H) 09/02/2013 1028   EOSABS 0.0 08/04/2019 0953   EOSABS 0.0 09/02/2013 1028   BASOSABS 0.3 (H) 07/12/2019 0953   BASOSABS 0.0 09/02/2013 1028   Comprehensive Metabolic Panel:    Component Value Date/Time   NA 141 08/04/2019 0500   NA 143 12/06/2015 1035   NA 135 (L) 07/18/2014 1205   K 4.1 08/04/2019 0500   K 3.4  (L) 07/18/2014 1205   CL 105 08/04/2019 0500   CL 101 07/18/2014 1205   CO2 26 08/04/2019 0500   CO2 24 07/18/2014 1205   BUN 24 (H) 08/04/2019 0500   BUN 12 12/06/2015 1035   BUN 10 07/18/2014 1205   CREATININE 0.82 08/04/2019 0500   CREATININE 0.78 11/26/2017 1017   GLUCOSE 104 (H) 08/04/2019 0500   GLUCOSE 82 07/18/2014 1205   CALCIUM 8.4 (L) 08/04/2019 0500   CALCIUM 9.1 07/18/2014 1205   AST 14 (L) 08/04/2019 0500   AST 44 (H) 07/18/2014 1205   ALT 11 08/04/2019 0500   ALT 38 07/18/2014 1205   ALKPHOS 65 08/04/2019 0500   ALKPHOS 65 07/18/2014 1205   BILITOT 0.8 08/04/2019 0500   BILITOT 0.5 07/18/2014 1205   PROT 6.8 08/04/2019 0500   PROT 8.1 07/18/2014 1205   ALBUMIN 2.6 (L) 08/04/2019 0500   ALBUMIN 3.7 07/18/2014 1205    RADIOGRAPHIC STUDIES: CT Chest W Contrast  Result Date: 07/22/2019 CLINICAL DATA:  History of lung cancer.  Restaging. EXAM: CT CHEST, ABDOMEN, AND PELVIS WITH CONTRAST TECHNIQUE: Multidetector CT imaging of the chest, abdomen and pelvis was performed following the standard protocol during bolus administration of intravenous contrast. CONTRAST:  113m OMNIPAQUE IOHEXOL 300 MG/ML  SOLN COMPARISON:  01/13/2019 FINDINGS: CT CHEST FINDINGS Cardiovascular: Cardiac enlargement. The small to moderate posterior pericardial effusion identified, image 111/6. Aortic atherosclerosis. Lad and RCA coronary artery calcifications. Mediastinum/Nodes: Thyroid gland not visualized and may be surgically absent The trachea appears patent and is midline. Normal appearance of the esophagus. No mediastinal, hilar, supraclavicular or axillary adenopathy. Lungs/Pleura: Paraseptal and centrilobular emphysema. No pleural effusion. Bandlike area of fibrosis and masslike architectural distortion within the perihilar left lung is again identified compatible with changes due to external beam radiation. The appearance is unchanged compared with the previous exam. No suspicious pulmonary  nodule or mass identified at this time. Musculoskeletal: Healing left lateral rib deformities. Multi level degenerative disc disease identified throughout the thoracic spine. CT ABDOMEN PELVIS FINDINGS Hepatobiliary: No focal liver abnormality. Gallbladder is unremarkable. No biliary ductal dilatation. Pancreas: Similar appearance of mild increase caliber of the main duct. No inflammation. No mass Spleen: The spleen measures 9.7 x 6.3 by 15.6 cm (volume = 500 cm^3). New linear, bandlike area of hypoenhancement within the superior aspect of the spleen is identified, image 66/4 and is concerning for a area of splenic laceration. Adrenals/Urinary Tract: Normal appearance of the adrenal glands. Right kidney cyst measures 1.7 cm. No mass or hydronephrosis. The urinary bladder is unremarkable. Stomach/Bowel: Stomach is within normal limits. Appendix appears normal. No evidence of bowel wall thickening, distention, or inflammatory changes. Vascular/Lymphatic: Aortic atherosclerosis. No aneurysm. No abdominopelvic adenopathy identified. Reproductive: Status post hysterectomy. No adnexal masses. Other: There is a small to moderate volume of free fluid within the abdomen and pelvis. No discrete fluid collections. Musculoskeletal: Scoliosis and degenerative  disc disease. No aggressive lytic or sclerotic bone lesions identified the at this time. IMPRESSION: 1. Stable changes of external beam radiation within the left lung without findings to suggest Lung cancer recurrence within the chest. 2. Significant increase in volume of the spleen. New bandlike area of low attenuation within the superior pole of spleen concerning for with age indeterminate splenic laceration. Alternatively, findings may reflect splenic infarct. 3. New small to moderate volume of free fluid within the abdomen and pelvis. Etiology is indeterminate but in the setting of splenic laceration could represent resolving hemoperitoneum. 4. Small pericardial  effusion, new from previous study. 5. Healing left rib fractures. 6. Aortic Atherosclerosis (ICD10-I70.0) and Emphysema (ICD10-J43.9). Electronically Signed   By: Kerby Moors M.D.   On: 07/22/2019 10:40   CT Angio Chest PE W and/or Wo Contrast  Result Date: 07/25/2019 CLINICAL DATA:  Shortness of breath EXAM: CT ANGIOGRAPHY CHEST WITH CONTRAST TECHNIQUE: Multidetector CT imaging of the chest was performed using the standard protocol during bolus administration of intravenous contrast. Multiplanar CT image reconstructions and MIPs were obtained to evaluate the vascular anatomy. CONTRAST:  24m OMNIPAQUE IOHEXOL 350 MG/ML SOLN COMPARISON:  07/22/2019 FINDINGS: Cardiovascular: Cardiomegaly with persistent small pericardial effusion. Pulmonary vasculature is adequately opacified. No filling defect to the segmental branch pulmonary arteries to suggest pulmonary embolism. Right-sided chest port and left-sided implanted cardiac device remain unchanged in positioning. Atherosclerotic calcification of the aorta and coronary arteries. Mediastinum/Nodes: No axillary, mediastinal, or hilar lymphadenopathy. Trachea and esophagus within normal limits. Lungs/Pleura: Post radiation changes with fibrosis and architectural distortion is again identified in the left perihilar region. Moderate emphysema. No focal airspace consolidation, pleural effusion, or pneumothorax. Upper Abdomen: Low-density area within the posterior aspect of the superior spleen. There also appears to be small amount of fluid around the spleen. No perihepatic ascites is evident. Musculoskeletal: Similar degenerative changes throughout the thoracic spine. Review of the MIP images confirms the above findings. IMPRESSION: 1. Negative for pulmonary embolus. 2. Stable post radiation changes in the left perihilar region. 3. Cardiomegaly with persistent small pericardial effusion. 4. Low-density area within the posterior aspect of the superior spleen, may  represent a splenic infarct. Small amount of fluid around the spleen, which appears new from prior. 5. Emphysema and aortic atherosclerosis. Aortic Atherosclerosis (ICD10-I70.0) and Emphysema (ICD10-J43.9). Electronically Signed   By: NDavina PokeD.O.   On: 07/29/2019 12:43   CT Abdomen Pelvis W Contrast  Result Date: 07/22/2019 CLINICAL DATA:  History of lung cancer.  Restaging. EXAM: CT CHEST, ABDOMEN, AND PELVIS WITH CONTRAST TECHNIQUE: Multidetector CT imaging of the chest, abdomen and pelvis was performed following the standard protocol during bolus administration of intravenous contrast. CONTRAST:  1055mOMNIPAQUE IOHEXOL 300 MG/ML  SOLN COMPARISON:  01/13/2019 FINDINGS: CT CHEST FINDINGS Cardiovascular: Cardiac enlargement. The small to moderate posterior pericardial effusion identified, image 111/6. Aortic atherosclerosis. Lad and RCA coronary artery calcifications. Mediastinum/Nodes: Thyroid gland not visualized and may be surgically absent The trachea appears patent and is midline. Normal appearance of the esophagus. No mediastinal, hilar, supraclavicular or axillary adenopathy. Lungs/Pleura: Paraseptal and centrilobular emphysema. No pleural effusion. Bandlike area of fibrosis and masslike architectural distortion within the perihilar left lung is again identified compatible with changes due to external beam radiation. The appearance is unchanged compared with the previous exam. No suspicious pulmonary nodule or mass identified at this time. Musculoskeletal: Healing left lateral rib deformities. Multi level degenerative disc disease identified throughout the thoracic spine. CT ABDOMEN PELVIS FINDINGS Hepatobiliary: No focal  liver abnormality. Gallbladder is unremarkable. No biliary ductal dilatation. Pancreas: Similar appearance of mild increase caliber of the main duct. No inflammation. No mass Spleen: The spleen measures 9.7 x 6.3 by 15.6 cm (volume = 500 cm^3). New linear, bandlike area of  hypoenhancement within the superior aspect of the spleen is identified, image 66/4 and is concerning for a area of splenic laceration. Adrenals/Urinary Tract: Normal appearance of the adrenal glands. Right kidney cyst measures 1.7 cm. No mass or hydronephrosis. The urinary bladder is unremarkable. Stomach/Bowel: Stomach is within normal limits. Appendix appears normal. No evidence of bowel wall thickening, distention, or inflammatory changes. Vascular/Lymphatic: Aortic atherosclerosis. No aneurysm. No abdominopelvic adenopathy identified. Reproductive: Status post hysterectomy. No adnexal masses. Other: There is a small to moderate volume of free fluid within the abdomen and pelvis. No discrete fluid collections. Musculoskeletal: Scoliosis and degenerative disc disease. No aggressive lytic or sclerotic bone lesions identified the at this time. IMPRESSION: 1. Stable changes of external beam radiation within the left lung without findings to suggest Lung cancer recurrence within the chest. 2. Significant increase in volume of the spleen. New bandlike area of low attenuation within the superior pole of spleen concerning for with age indeterminate splenic laceration. Alternatively, findings may reflect splenic infarct. 3. New small to moderate volume of free fluid within the abdomen and pelvis. Etiology is indeterminate but in the setting of splenic laceration could represent resolving hemoperitoneum. 4. Small pericardial effusion, new from previous study. 5. Healing left rib fractures. 6. Aortic Atherosclerosis (ICD10-I70.0) and Emphysema (ICD10-J43.9). Electronically Signed   By: Kerby Moors M.D.   On: 07/22/2019 10:40   DG Chest Port 1 View  Result Date: 07/24/2019 CLINICAL DATA:  Acute respiratory distress. EXAM: PORTABLE CHEST 1 VIEW COMPARISON:  CTA chest and chest x-ray from same day. FINDINGS: Unchanged left chest wall pacemaker. Unchanged right chest wall port catheter. Stable cardiomegaly. Normal  pulmonary vascularity. Unchanged subtle increased density in the left upper lobe. No pleural effusion or pneumothorax. No acute osseous abnormality. IMPRESSION: 1. Unchanged subtle increased density in the left upper lobe, corresponding to faint ground-glass density seen on chest CT from earlier today, likely infectious or inflammatory. Electronically Signed   By: Titus Dubin M.D.   On: 07/08/2019 20:47   DG Chest Port 1 View  Result Date: 08/04/2019 CLINICAL DATA:  Dyspnea. EXAM: PORTABLE CHEST 1 VIEW COMPARISON:  July 30, 2019. FINDINGS: Stable cardiomegaly. No pneumothorax or significant pleural effusion is noted. Right internal jugular Port-A-Cath is unchanged in position. Left-sided pacemaker is unchanged. Lungs are clear. Old left rib fractures are noted. IMPRESSION: No active disease. Electronically Signed   By: Marijo Conception M.D.   On: 08/02/2019 10:07   DG Chest Portable 1 View  Result Date: 07/30/2019 CLINICAL DATA:  Weakness and diarrhea. History of lung carcinoma and renal carcinoma. EXAM: PORTABLE CHEST 1 VIEW COMPARISON:  CT of the chest on 07/22/2019 and prior chest x-ray on 06/18/2019 FINDINGS: Stable cardiac enlargement and appearance of dual-chamber pacemaker. Stable appearance of Port-A-Cath. Stable chronic atelectasis and volume loss at the left lung base. There is no evidence of pulmonary edema, consolidation, pneumothorax, nodule or pleural fluid. The visualized skeletal structures are unremarkable. IMPRESSION: Stable chest x-ray demonstrating stable cardiomegaly and chronic atelectasis at the left lung base. Electronically Signed   By: Aletta Edouard M.D.   On: 07/30/2019 13:40    PERFORMANCE STATUS (ECOG) : 3 - Symptomatic, >50% confined to bed  Review of Systems Unable to complete  Physical Exam General: Ill-appearing Cardiovascular: regular rate and rhythm Pulmonary: Intermittently labored appearing Extremities: no edema, no joint deformities Skin: no  rashes Neurological: Lethargic  IMPRESSION: Patient acutely declined overnight with rapid response called due to tachypnea and tachycardia.  She became hypotensive earlier today and was transferred to the ICU.  Tachycardia improved after fluid bolus but patient remains hypotensive and may require pressors.  I met with patient's daughter, Caroline Nichols, who was at the bedside.  Together, we reviewed patient's current medical problems.  She verbalized an understanding that patient is quite ill and could potentially be nearing end-of-life.  She verbalized agreement with the current scope of treatment but we discussed the option of transitioning to less aggressive/comfort care in the event of further decline.  She did verbalize agreement with DNR, which was also communicated to Dr. Grayland Ormond.  PLAN: -Continue current scope of treatment -DNR/DNI -Will follow   Time Total: 20 minutes  Visit consisted of counseling and education dealing with the complex and emotionally intense issues of symptom management and palliative care in the setting of serious and potentially life-threatening illness.Greater than 50%  of this time was spent counseling and coordinating care related to the above assessment and plan.  Signed by: Altha Harm, PhD, NP-C

## 2019-08-04 NOTE — Progress Notes (Signed)
Patient arrived from [facility] and had been tachypneic and lethargic since arrival but was able to maintain oxygen levels. Around 18:30 patient appeared the same but was unable to maintain oxygen saturations. Titrated up to venturi mask at 15L 55%, then non-rebreather. Notified APP B. Randol Kern who arrived to assess patient in person and ordered new interventions. Notified patient's daughter [name] of decompensation who is coming in person.

## 2019-08-04 NOTE — Plan of Care (Signed)
Patient's blood pressure is still soft at 98/63 with a HR of 118 and 32 RR. Will continue to monitor.  Christene Slates

## 2019-08-04 NOTE — Consult Note (Signed)
Pharmacy Antibiotic Note  Caroline Nichols is a 78 y.o. female admitted on 07/29/2019 with sepsis. She has a medical history significant forchronic anemia, anxiety, rheumatoid arthritis, diabetes insipidus, GERD, splenic laceration, hyperlipidemia, hypertension.  Baseline serum creatinine appears to be 0.6 to 0.8.  Patient also has extensive cancer history involving neuroendocrine malignancy of the lung, renal carcinoma with partial nephrectomy, as well as myelodysplastic syndrome.  Patient is currently experiencing acute on chronic diarrhea, and will be admitted to ICU due to worsening vitals reflecting SIRS criteria.  Pharmacy has been consulted for vancomycin and cefepime dosing.    Plan: Give vancomycin 1500 mg IV x 1 dose followed by maintenance dose of vancomycin 1000 mg IV q24 hours.  AUC goal 400-550, and pharmacy is also consulted for goal Css min ~15. Expected AUC:  ~550 Expected Css min:  13.9  Give cefepime 2 g IV q12 hours  Pharmacy will follow renal function, and obtain vancomycin levels as indicated.    Height: 5\' 2"  (157.5 cm) Weight: 134 lb (60.8 kg) IBW/kg (Calculated) : 50.1  Temp (24hrs), Avg:98.2 F (36.8 C), Min:94.1 F (34.5 C), Max:99.6 F (37.6 C)  Recent Labs  Lab 07/30/19 0947 07/21/2019 0953 07/30/2019 2136 08/04/19 0013 08/04/19 0500  WBC 12.3* 8.5  --   --  5.0  CREATININE 1.07* 0.80  --   --  0.82  LATICACIDVEN  --   --  1.2 1.1  --     Estimated Creatinine Clearance: 48.6 mL/min (by C-G formula based on SCr of 0.82 mg/dL).    Allergies  Allergen Reactions  . Metrizamide     Other reaction(s): Other (See Comments)  . Adhesive [Tape] Other (See Comments)    "irritate skin"  . Augmentin [Amoxicillin-Pot Clavulanate] Diarrhea    Severe diarrhea  . Codeine Nausea Only  . Morphine And Related Other (See Comments)    Shut down her organs  . Other     Skin irritation  . Tetanus Toxoid Other (See Comments)    REACTION: ARM SWELLING,REDNESS  .  Tramadol Nausea And Vomiting    Antimicrobials this admission: 12/30 Ceftriaxone >> 12/30 12/30 Vancomycin >>  12/30 Cefepime >>   Dose adjustments this admission: None  Microbiology results: 12/30 BCx: Sent 12/30 UCx: Sent  12/29 Resp panel (Flu A&B, COVID): negative C diff: Sent GI panel: Sent  Thank you for allowing pharmacy to be a part of this patient's care.  Gerald Dexter, PharmD Pharmacy Resident  08/04/2019 3:59 PM

## 2019-08-05 ENCOUNTER — Inpatient Hospital Stay: Payer: Medicare Other | Admitting: Oncology

## 2019-08-05 ENCOUNTER — Inpatient Hospital Stay: Payer: Medicare Other

## 2019-08-05 ENCOUNTER — Inpatient Hospital Stay: Payer: Medicare Other | Admitting: Hospice and Palliative Medicine

## 2019-08-05 LAB — URINE CULTURE: Culture: NO GROWTH

## 2019-08-05 LAB — GLUCOSE 6 PHOSPHATE DEHYDROGENASE
G6PDH: 8.6 U/g{Hb} (ref 4.8–15.7)
Hemoglobin: 8.1 g/dL — ABNORMAL LOW (ref 11.1–15.9)

## 2019-08-05 MED ORDER — LORAZEPAM 2 MG/ML IJ SOLN
1.0000 mg | INTRAMUSCULAR | Status: DC | PRN
  Administered 2019-08-05: 13:00:00 1 mg via INTRAVENOUS
  Filled 2019-08-05: qty 1

## 2019-08-05 MED ORDER — HALOPERIDOL LACTATE 5 MG/ML IJ SOLN: 0.5000 mg | INTRAMUSCULAR | Status: DC | PRN

## 2019-08-05 MED ORDER — MORPHINE 100MG IN NS 100ML (1MG/ML) PREMIX INFUSION
1.0000 mg/h | INTRAVENOUS | Status: DC
  Administered 2019-08-05: 1 mg/h via INTRAVENOUS
  Filled 2019-08-05: qty 100

## 2019-08-05 MED ORDER — GLYCOPYRROLATE 0.2 MG/ML IJ SOLN: 0.2000 mg | INTRAMUSCULAR | Status: DC | PRN

## 2019-08-05 MED ORDER — MORPHINE BOLUS VIA INFUSION
1.0000 mg | INTRAVENOUS | Status: DC | PRN
  Administered 2019-08-05: 15:00:00 1 mg via INTRAVENOUS
  Filled 2019-08-05: qty 1

## 2019-08-06 LAB — CULTURE, BLOOD (ROUTINE X 2): Special Requests: ADEQUATE

## 2019-08-06 NOTE — Progress Notes (Signed)
PT Cancellation Note  Patient Details Name: Caroline Nichols MRN: [id number] DOB: [birth year]   Cancelled Treatment:    Reason Eval/Treat Not Completed: Patient not medically ready; Consult received, chart reviewed. Recent vitals indicating pt on BiPAP with HR 27 bpm. Per hospitalist note from overnight, family "decided not to escalate care during the night and pursue comfort measures with removal of bipap in the morning when they can be present."  Will continue to follow acutely for appropriateness of therapy intervention.    Linus Salmons PT, DPT 12-Aug-2019, 9:03 AM

## 2019-08-06 NOTE — Progress Notes (Signed)
   08/06/19 1600  Clinical Encounter Type  Visited With Family;Health care provider  Visit Type Death  Referral From Nurse  Spiritual Encounters  Spiritual Needs Prayer;Emotional;Grief support  Stress Factors  Family Stress Factors Loss;Major life changes   Page received requesting support of the patients' daughters in the wake of her passing. Upon arrival, the patient's daughters were at the bedside and grieving appropriately. Condolences offered to their family and they continued a brief life review of their mother. Support in the form of active and reflective listening, compassionate ministerial presence, comfort, prayer and theological reflection. This chaplain offered a prayer of comfort for the family at the bedside and they expressed gratitude for the support offered by the [facility] staff during their mother's current hospitalization.

## 2019-08-06 NOTE — Death Summary Note (Signed)
DEATH SUMMARY   Patient Details  Name: Caroline Nichols MRN: [id number] DOB: 1941-04-03  Admission/Discharge Information   Admit Date:  2019/08/19  Date of Death: Date of Death: 08-21-19  Time of Death: Time of Death: 10/03/49  Length of Stay: 2  Referring Physician: [name], MD   Reason(s) for Hospitalization  diarrhea, weakness, poor PO intake  Diagnoses  Preliminary cause of death:   systemic inflammatory response  acute hypoxic respiratory failure with worsening anemia and failure to thrive acute on chronic diarrhea severe hypotension Secondary Diagnoses (including complications and co-morbidities):  Active Problems:   Diarrhea   Acute diarrhea   Palliative care encounter   SIRS (systemic inflammatory response syndrome) (HCC)   Weakness   Acute respiratory distress   Brief Hospital Course (including significant findings, care, treatment, and services provided and events leading to death)  Caroline Nichols is a 79 y.o. year old female who has history of chronic [place] secondary to high risk MDS, history of neuroendocrine carcinoma of the lung and renal cell carcinoma with history of chronic diarrhea and poor PO intake with recent declining performance status over last several weeks comes to the emergency room with generalized weakness, fatigue ability and diarrhea. Patient was admitted to the hospital started on IV fluids. She continues to decline overall became hypotensive and unresponsive. Patient was transferred to the ICU. She was started on IV pressers including IV fluids. Overnight she declined requiring high fire to to maintain her sats on BiPAP. Palliative care consultation had met with patient's daughters. Patient was under not resuscitate do not intubate. Overall patient notes show any improvement in the morning. Thereafter daughters decided and requested full comfort care. Patient was started on IV morphine drip and  She passed away comfortably at 1551  hrs.    Pertinent Labs and Studies  Significant Diagnostic Studies DG Chest 1 View  Result Date: 08/04/2019 CLINICAL DATA:  Hypoxia EXAM: CHEST  1 VIEW COMPARISON:  08-19-2019 FINDINGS: There is a right-sided Port-A-Cath with the tip projecting over the SVC. There is no focal consolidation. There is no pleural effusion or pneumothorax. There is stable cardiomegaly. There is a dual lead cardiac pacemaker. There is no acute osseous abnormality. IMPRESSION: No active disease. Electronically Signed   By: Kathreen Devoid   On: 08/04/2019 19:51   CT Chest W Contrast  Result Date: 07/22/2019 CLINICAL DATA:  History of lung cancer.  Restaging. EXAM: CT CHEST, ABDOMEN, AND PELVIS WITH CONTRAST TECHNIQUE: Multidetector CT imaging of the chest, abdomen and pelvis was performed following the standard protocol during bolus administration of intravenous contrast. CONTRAST:  137m OMNIPAQUE IOHEXOL 300 MG/ML  SOLN COMPARISON:  01/13/2019 FINDINGS: CT CHEST FINDINGS Cardiovascular: Cardiac enlargement. The small to moderate posterior pericardial effusion identified, image 111/6. Aortic atherosclerosis. Lad and RCA coronary artery calcifications. Mediastinum/Nodes: Thyroid gland not visualized and may be surgically absent The trachea appears patent and is midline. Normal appearance of the esophagus. No mediastinal, hilar, supraclavicular or axillary adenopathy. Lungs/Pleura: Paraseptal and centrilobular emphysema. No pleural effusion. Bandlike area of fibrosis and masslike architectural distortion within the perihilar left lung is again identified compatible with changes due to external beam radiation. The appearance is unchanged compared with the previous exam. No suspicious pulmonary nodule or mass identified at this time. Musculoskeletal: Healing left lateral rib deformities. Multi level degenerative disc disease identified throughout the thoracic spine. CT ABDOMEN PELVIS FINDINGS Hepatobiliary: No focal liver  abnormality. Gallbladder is unremarkable. No biliary ductal dilatation. Pancreas: Similar appearance  of mild increase caliber of the main duct. No inflammation. No mass Spleen: The spleen measures 9.7 x 6.3 by 15.6 cm (volume = 500 cm^3). New linear, bandlike area of hypoenhancement within the superior aspect of the spleen is identified, image 66/4 and is concerning for a area of splenic laceration. Adrenals/Urinary Tract: Normal appearance of the adrenal glands. Right kidney cyst measures 1.7 cm. No mass or hydronephrosis. The urinary bladder is unremarkable. Stomach/Bowel: Stomach is within normal limits. Appendix appears normal. No evidence of bowel wall thickening, distention, or inflammatory changes. Vascular/Lymphatic: Aortic atherosclerosis. No aneurysm. No abdominopelvic adenopathy identified. Reproductive: Status post hysterectomy. No adnexal masses. Other: There is a small to moderate volume of free fluid within the abdomen and pelvis. No discrete fluid collections. Musculoskeletal: Scoliosis and degenerative disc disease. No aggressive lytic or sclerotic bone lesions identified the at this time. IMPRESSION: 1. Stable changes of external beam radiation within the left lung without findings to suggest Lung cancer recurrence within the chest. 2. Significant increase in volume of the spleen. New bandlike area of low attenuation within the superior pole of spleen concerning for with age indeterminate splenic laceration. Alternatively, findings may reflect splenic infarct. 3. New small to moderate volume of free fluid within the abdomen and pelvis. Etiology is indeterminate but in the setting of splenic laceration could represent resolving hemoperitoneum. 4. Small pericardial effusion, new from previous study. 5. Healing left rib fractures. 6. Aortic Atherosclerosis (ICD10-I70.0) and Emphysema (ICD10-J43.9). Electronically Signed   By: Kerby Moors M.D.   On: 07/22/2019 10:40   CT Angio Chest PE W and/or Wo  Contrast  Result Date: 07/23/2019 CLINICAL DATA:  Shortness of breath EXAM: CT ANGIOGRAPHY CHEST WITH CONTRAST TECHNIQUE: Multidetector CT imaging of the chest was performed using the standard protocol during bolus administration of intravenous contrast. Multiplanar CT image reconstructions and MIPs were obtained to evaluate the vascular anatomy. CONTRAST:  32m OMNIPAQUE IOHEXOL 350 MG/ML SOLN COMPARISON:  07/22/2019 FINDINGS: Cardiovascular: Cardiomegaly with persistent small pericardial effusion. Pulmonary vasculature is adequately opacified. No filling defect to the segmental branch pulmonary arteries to suggest pulmonary embolism. Right-sided chest port and left-sided implanted cardiac device remain unchanged in positioning. Atherosclerotic calcification of the aorta and coronary arteries. Mediastinum/Nodes: No axillary, mediastinal, or hilar lymphadenopathy. Trachea and esophagus within normal limits. Lungs/Pleura: Post radiation changes with fibrosis and architectural distortion is again identified in the left perihilar region. Moderate emphysema. No focal airspace consolidation, pleural effusion, or pneumothorax. Upper Abdomen: Low-density area within the posterior aspect of the superior spleen. There also appears to be small amount of fluid around the spleen. No perihepatic ascites is evident. Musculoskeletal: Similar degenerative changes throughout the thoracic spine. Review of the MIP images confirms the above findings. IMPRESSION: 1. Negative for pulmonary embolus. 2. Stable post radiation changes in the left perihilar region. 3. Cardiomegaly with persistent small pericardial effusion. 4. Low-density area within the posterior aspect of the superior spleen, may represent a splenic infarct. Small amount of fluid around the spleen, which appears new from prior. 5. Emphysema and aortic atherosclerosis. Aortic Atherosclerosis (ICD10-I70.0) and Emphysema (ICD10-J43.9). Electronically Signed   By: NDavina PokeD.O.   On: 07/19/2019 12:43   CT Abdomen Pelvis W Contrast  Result Date: 07/22/2019 CLINICAL DATA:  History of lung cancer.  Restaging. EXAM: CT CHEST, ABDOMEN, AND PELVIS WITH CONTRAST TECHNIQUE: Multidetector CT imaging of the chest, abdomen and pelvis was performed following the standard protocol during bolus administration of intravenous contrast. CONTRAST:  1060mOMNIPAQUE IOHEXOL 300  MG/ML  SOLN COMPARISON:  01/13/2019 FINDINGS: CT CHEST FINDINGS Cardiovascular: Cardiac enlargement. The small to moderate posterior pericardial effusion identified, image 111/6. Aortic atherosclerosis. Lad and RCA coronary artery calcifications. Mediastinum/Nodes: Thyroid gland not visualized and may be surgically absent The trachea appears patent and is midline. Normal appearance of the esophagus. No mediastinal, hilar, supraclavicular or axillary adenopathy. Lungs/Pleura: Paraseptal and centrilobular emphysema. No pleural effusion. Bandlike area of fibrosis and masslike architectural distortion within the perihilar left lung is again identified compatible with changes due to external beam radiation. The appearance is unchanged compared with the previous exam. No suspicious pulmonary nodule or mass identified at this time. Musculoskeletal: Healing left lateral rib deformities. Multi level degenerative disc disease identified throughout the thoracic spine. CT ABDOMEN PELVIS FINDINGS Hepatobiliary: No focal liver abnormality. Gallbladder is unremarkable. No biliary ductal dilatation. Pancreas: Similar appearance of mild increase caliber of the main duct. No inflammation. No mass Spleen: The spleen measures 9.7 x 6.3 by 15.6 cm (volume = 500 cm^3). New linear, bandlike area of hypoenhancement within the superior aspect of the spleen is identified, image 66/4 and is concerning for a area of splenic laceration. Adrenals/Urinary Tract: Normal appearance of the adrenal glands. Right kidney cyst measures 1.7 cm. No mass or  hydronephrosis. The urinary bladder is unremarkable. Stomach/Bowel: Stomach is within normal limits. Appendix appears normal. No evidence of bowel wall thickening, distention, or inflammatory changes. Vascular/Lymphatic: Aortic atherosclerosis. No aneurysm. No abdominopelvic adenopathy identified. Reproductive: Status post hysterectomy. No adnexal masses. Other: There is a small to moderate volume of free fluid within the abdomen and pelvis. No discrete fluid collections. Musculoskeletal: Scoliosis and degenerative disc disease. No aggressive lytic or sclerotic bone lesions identified the at this time. IMPRESSION: 1. Stable changes of external beam radiation within the left lung without findings to suggest Lung cancer recurrence within the chest. 2. Significant increase in volume of the spleen. New bandlike area of low attenuation within the superior pole of spleen concerning for with age indeterminate splenic laceration. Alternatively, findings may reflect splenic infarct. 3. New small to moderate volume of free fluid within the abdomen and pelvis. Etiology is indeterminate but in the setting of splenic laceration could represent resolving hemoperitoneum. 4. Small pericardial effusion, new from previous study. 5. Healing left rib fractures. 6. Aortic Atherosclerosis (ICD10-I70.0) and Emphysema (ICD10-J43.9). Electronically Signed   By: Kerby Moors M.D.   On: 07/22/2019 10:40   DG Chest Port 1 View  Result Date: 08/02/2019 CLINICAL DATA:  Acute respiratory distress. EXAM: PORTABLE CHEST 1 VIEW COMPARISON:  CTA chest and chest x-ray from same day. FINDINGS: Unchanged left chest wall pacemaker. Unchanged right chest wall port catheter. Stable cardiomegaly. Normal pulmonary vascularity. Unchanged subtle increased density in the left upper lobe. No pleural effusion or pneumothorax. No acute osseous abnormality. IMPRESSION: 1. Unchanged subtle increased density in the left upper lobe, corresponding to faint  ground-glass density seen on chest CT from earlier today, likely infectious or inflammatory. Electronically Signed   By: Titus Dubin M.D.   On: 07/10/2019 20:47   DG Chest Port 1 View  Result Date: 07/12/2019 CLINICAL DATA:  Dyspnea. EXAM: PORTABLE CHEST 1 VIEW COMPARISON:  July 30, 2019. FINDINGS: Stable cardiomegaly. No pneumothorax or significant pleural effusion is noted. Right internal jugular Port-A-Cath is unchanged in position. Left-sided pacemaker is unchanged. Lungs are clear. Old left rib fractures are noted. IMPRESSION: No active disease. Electronically Signed   By: Marijo Conception M.D.   On: 07/31/2019 10:07   DG  Chest Portable 1 View  Result Date: 07/30/2019 CLINICAL DATA:  Weakness and diarrhea. History of lung carcinoma and renal carcinoma. EXAM: PORTABLE CHEST 1 VIEW COMPARISON:  CT of the chest on 07/22/2019 and prior chest x-ray on 06/18/2019 FINDINGS: Stable cardiac enlargement and appearance of dual-chamber pacemaker. Stable appearance of Port-A-Cath. Stable chronic atelectasis and volume loss at the left lung base. There is no evidence of pulmonary edema, consolidation, pneumothorax, nodule or pleural fluid. The visualized skeletal structures are unremarkable. IMPRESSION: Stable chest x-ray demonstrating stable cardiomegaly and chronic atelectasis at the left lung base. Electronically Signed   By: Aletta Edouard M.D.   On: 07/30/2019 13:40    Microbiology Recent Results (from the past 240 hour(s))  Respiratory Panel by RT PCR (Flu A&B, Covid) - Nasopharyngeal Swab     Status: None   Collection Time: 08/04/2019  9:47 AM   Specimen: Nasopharyngeal Swab  Result Value Ref Range Status   SARS Coronavirus 2 by RT PCR NEGATIVE NEGATIVE Final    Comment: (NOTE) SARS-CoV-2 target nucleic acids are NOT DETECTED. The SARS-CoV-2 RNA is generally detectable in upper respiratoy specimens during the acute phase of infection. The lowest concentration of SARS-CoV-2 viral copies  this assay can detect is 131 copies/mL. A negative result does not preclude SARS-Cov-2 infection and should not be used as the sole basis for treatment or other patient management decisions. A negative result may occur with  improper specimen collection/handling, submission of specimen other than nasopharyngeal swab, presence of viral mutation(s) within the areas targeted by this assay, and inadequate number of viral copies (<131 copies/mL). A negative result must be combined with clinical observations, patient history, and epidemiological information. The expected result is Negative. Fact Sheet for Patients:  PinkCheek.be Fact Sheet for Healthcare Providers:  GravelBags.it This test is not yet ap proved or cleared by the [place] FDA and  has been authorized for detection and/or diagnosis of SARS-CoV-2 by FDA under an Emergency Use Authorization (EUA). This EUA will remain  in effect (meaning this test can be used) for the duration of the COVID-19 declaration under Section 564(b)(1) of the Act, 21 U.S.C. section 360bbb-3(b)(1), unless the authorization is terminated or revoked sooner.    Influenza A by PCR NEGATIVE NEGATIVE Final   Influenza B by PCR NEGATIVE NEGATIVE Final    Comment: (NOTE) The Xpert Xpress SARS-CoV-2/FLU/RSV assay is intended as an aid in  the diagnosis of influenza from Nasopharyngeal swab specimens and  should not be used as a sole basis for treatment. Nasal washings and  aspirates are unacceptable for Xpert Xpress SARS-CoV-2/FLU/RSV  testing. Fact Sheet for Patients: PinkCheek.be Fact Sheet for Healthcare Providers: GravelBags.it This test is not yet approved or cleared by the [place] FDA and  has been authorized for detection and/or diagnosis of SARS-CoV-2 by  FDA under an Emergency Use Authorization (EUA). This EUA will remain  in  effect (meaning this test can be used) for the duration of the  Covid-19 declaration under Section 564(b)(1) of the Act, 21  U.S.C. section 360bbb-3(b)(1), unless the authorization is  terminated or revoked. Performed at [facility], [address] [place], [place] [address]   CULTURE, BLOOD (ROUTINE X 2) w Reflex to ID Panel     Status: None (Preliminary result)   Collection Time: 08/04/19 11:18 AM   Specimen: BLOOD  Result Value Ref Range Status   Specimen Description BLOOD PORT  Final   Special Requests   Final    BOTTLES DRAWN  AEROBIC AND ANAEROBIC Blood Culture adequate volume   Culture   Final    NO GROWTH < 24 HOURS Performed at [facility], [address] [place], [place] [address]    Report Status PENDING  Incomplete  CULTURE, BLOOD (ROUTINE X 2) w Reflex to ID Panel     Status: None (Preliminary result)   Collection Time: 08/04/19 11:50 AM   Specimen: BLOOD  Result Value Ref Range Status   Specimen Description BLOOD BLOOD RIGHT HAND  Final   Special Requests   Final    BOTTLES DRAWN AEROBIC ONLY Blood Culture adequate volume   Culture  Setup Time   Final    GRAM POSITIVE COCCI AEROBIC BOTTLE ONLY CRITICAL RESULT CALLED TO, READ BACK BY AND VERIFIED WITH: DAVID BESANTI AT 1915 ON August 25, 2019 [facility] Performed at [facility], [address] [place], [place] [address]    Culture [facility] POSITIVE COCCI  Final   Report Status PENDING  Incomplete    Lab Basic Metabolic Panel: Recent Labs  Lab 07/30/19 0947 07/20/2019 0953 08/04/19 0500 08/04/19 2012  NA 135 138 141 142  K 3.2* 3.5 4.1 4.5  CL 100 103 105 107  CO2 21* 23 26 20*  GLUCOSE 126* 114* 104* 132*  BUN 13 23 24* 30*  CREATININE 1.07* 0.80 0.82 1.32*  CALCIUM 8.7* 8.8* 8.4* 8.4*  MG  --   --  1.8  --    Liver Function Tests: Recent Labs  Lab 07/30/19 0947 08/04/19 0500 08/04/19 2012  AST 22 14* 19  ALT _0 ALKPHOS 85 65 87  BILITOT 1.2 0.8 0.9  PROT 7.7 6.8 6.7   ALBUMIN 3.5 2.6* 2.5*   Recent Labs  Lab 07/30/19 0947  LIPASE 21   No results for input(s): AMMONIA in the last 168 hours. CBC: Recent Labs  Lab 07/30/19 0947 07/17/2019 0953 08/04/19 0013 08/04/19 0500 08/04/19 0826 08/04/19 2012  WBC 12.3* 8.5  --  5.0  --  12.6*  NEUTROABS 7.3 3.0  --   --   --  5.7  HGB 8.4* 7.4* 7.5* 7.6*  8.1* 8.0* 8.0*  HCT 25.6* 23.5*  --  24.2*  --  27.0*  MCV 86.2 91.4  --  91.0  --  94.7  PLT 112* 77*  --  61*  --  67*   Cardiac Enzymes: No results for input(s): CKTOTAL, CKMB, CKMBINDEX, TROPONINI in the last 168 hours. Sepsis Labs: Recent Labs  Lab 07/30/19 0947 07/17/2019 0953 07/21/2019 2136 08/04/19 0013 08/04/19 0500 08/04/19 2012  PROCALCITON  --   --  3.83  --  3.53  --   WBC 12.3* 8.5  --   --  5.0 12.6*  LATICACIDVEN  --   --  1.2 1.1  --   --     Procedures/Operations     Fritzi Mandes 25-Aug-2019, 4:17 PM

## 2019-08-06 NOTE — Progress Notes (Signed)
[facility]  Telephone:([phone] [phone]   Name: Caroline Nichols Date: 09/04/19 MRN: [id number]  DOB: [birth year]  Patient Care Team: [name], MD as PCP - General (Internal Medicine) [name], MD (Internal Medicine) [name] [name], MD (General Surgery) Lloyd Huger, MD as Consulting Physician (Oncology) Noreene Filbert, MD as Referring Physician (Radiation Oncology)    REASON FOR CONSULTATION: Caroline Nichols is a 79 y.o. 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  Patient has multiple recent ER visits for weakness, poor oral intake, and diarrhea.  She was admitted for same on 07/22/2019.  Palliative care was consulted to help address goals and manage ongoing symptoms.  CODE STATUS: DNR  PAST MEDICAL HISTORY: Past Medical History:  Diagnosis Date  . Anemia 02/2013   F/U with Dr. Grayland Ormond for Feraheme injections  . Anxiety   . Arthritis    Possible RA - Follow up appt with Dr. Jefm Bryant  . Cellulitis of arm, right   . Chest pain 03/26/2013  . Diabetes insipidus ([place])    On desmopressin  . GERD (gastroesophageal reflux disease)   . H/O seasonal allergies   . H/O transfusion of packed red blood cells 02/2013   3 units PRBC for severe anemia 02/2013  . Herniated disc   . History of kidney stones   . Hyperlipidemia   . Hypertension   . Hyperthyroidism    s/p radiation therapy, now hypothyroidism  . IBS (irritable bowel syndrome)   . Lung cancer ([place]) 06/2017   left  . Migraine   . Pacemaker    a. s/p successful implantation of a Medtronic CapSureFix Novus MRIT SureScan serial # [id number] on 06/28/16 by Dr. Caryl Comes for sinus node dysfunction.  . Pancreatic cyst   .  Paroxysmal A-fib ([place])   . Renal cell cancer, right ([place]) 10/23/2015   Right partial nephrectomy.    PAST SURGICAL HISTORY:  Past Surgical History:  Procedure Laterality Date  . ABDOMINAL HYSTERECTOMY    . BREAST EXCISIONAL BIOPSY Right 2005   neg  . BREAST SURGERY  1990s   Biopsy  . CERVICAL SPINE SURGERY     Bone fusion  . COLONOSCOPY  2008?   [place]  . COLONOSCOPY WITH PROPOFOL N/A 10/15/2016   Procedure: COLONOSCOPY WITH PROPOFOL;  Surgeon: Robert Bellow, MD;  Location: [facility] ENDOSCOPY;  Service: Endoscopy;  Laterality: N/A;  . EP IMPLANTABLE DEVICE N/A 06/28/2016   Procedure: Pacemaker Implant;  Surgeon: Deboraha Sprang, MD;  Location: [place] [facility] CV LAB;  Service: Cardiovascular;  Laterality: N/A;  . HAMMER TOE SURGERY    . HERNIA REPAIR Left    Inguinal Hernia Repair  . PORTA CATH INSERTION N/A 07/24/2017   Procedure: PORTA CATH INSERTION;  Surgeon: Algernon Huxley, MD;  Location: [place] CV LAB;  Service: Cardiovascular;  Laterality: N/A;  . ROBOTIC ASSITED PARTIAL NEPHRECTOMY Right 10/23/2015   Procedure: ROBOTIC ASSITED PARTIAL NEPHRECTOMY with intraop ultrasound;  Surgeon: Hollice Espy, MD;  Location: [facility] ORS;  Service: Urology;  Laterality: Right;  . TUBAL LIGATION      HEMATOLOGY/ONCOLOGY HISTORY:  Oncology History Overview Note  Stage IIb left lung large cell neuroendocrine carcinoma: Biopsy confirms second primary.  Patient completed cycle 3 of carboplatinum and etoposide on September 19, 2017.  She also completed XRT.  Given her persistent pancytopenia, treatment was discontinued.  Restaging PET scan from January 12, 2018 reviewed independently and reported as above with no findings to suggest recurrent or metastatic disease     Malignant neoplasm of lung, unspecified laterality, unspecified part of lung ([place])  07/16/2017 Initial Diagnosis   Neuroendocrine carcinoma of lung ([place])     ALLERGIES:  is allergic to metrizamide; adhesive [tape]; augmentin  [amoxicillin-pot clavulanate]; codeine; morphine and related; other; tetanus toxoid; and tramadol.  MEDICATIONS:  Current Facility-Administered Medications  Medication Dose Route Frequency Provider Last Rate Last Admin  . 0.9 %  sodium chloride infusion   Intravenous Continuous Fritzi Mandes, MD   Stopped at 08/04/19 1952  . glycopyrrolate (ROBINUL) injection 0.2 mg  0.2 mg Intravenous Q4H PRN Eladia Frame, Kirt Boys, NP      . haloperidol lactate (HALDOL) injection 0.5 mg  0.5 mg Intravenous Q4H PRN Eriyana Sweeten, Kirt Boys, NP      . heparin lock flush 100 unit/mL  250 Units Intracatheter PRN Lloyd Huger, MD      . heparin lock flush 100 unit/mL  500 Units Intracatheter Daily PRN Lloyd Huger, MD      . heparin lock flush 100 unit/mL  250 Units Intracatheter PRN Lloyd Huger, MD      . LORazepam (ATIVAN) injection 1 mg  1 mg Intravenous Q4H PRN Danamarie Minami, Kirt Boys, NP      . morphine 146m in NS 1089m(26m70mL) infusion - premix  1 mg/hr Intravenous Continuous Trystan Eads, JosVonna Kotyk NP      . morphine bolus via infusion 1 mg  1 mg Intravenous Q30 min PRN Faizah Kandler, JosKirt BoysP      . sodium chloride flush (NS) 0.9 % injection 10 mL  10 mL Intracatheter PRN FinLloyd HugerD      . sodium chloride flush (NS) 0.9 % injection 10 mL  10 mL Intracatheter PRN FinLloyd HugerD      . sodium chloride flush (NS) 0.9 % injection 3 mL  3 mL Intracatheter PRN FinLloyd HugerD      . sodium chloride flush (NS) 0.9 % injection 3 mL  3 mL Intracatheter PRN FinLloyd HugerD        VITAL SIGNS: BP 94/60   Pulse (!) 27   Temp 98.8 F (37.1 C)   Resp 19   Ht _0  (1.575 m)   Wt 134 lb (60.8 kg)   SpO2 91%   BMI 24.51 kg/m  Filed Weights   07/14/2019 0947  Weight: 134 lb (60.8 kg)    Estimated body mass index is 24.51 kg/m as calculated from the following:   Height as of this encounter: _1  (1.575 m).   Weight as of this encounter: 134 lb (60.8 kg).  LABS: CBC:      Component Value Date/Time   WBC 12.6 (H) 08/04/2019 2012   HGB 8.0 (L) 08/04/2019 2012   HGB 11.7 12/06/2015 1035   HCT 27.0 (L) 08/04/2019 2012   HCT 36.9 12/06/2015 1035   PLT 67 (L) 08/04/2019 2012   PLT 122 (L) 12/06/2015 1035   MCV 94.7 08/04/2019 2012   MCV 95 12/06/2015 1035   MCV 89 07/18/2014 1205   NEUTROABS 5.7 08/04/2019 2012   NEUTROABS 1 03/02/2014 0000   NEUTROABS 0.6 (L) 09/02/2013 1028   LYMPHSABS 3.8 08/04/2019 2012   LYMPHSABS 0.8 (L) 09/02/2013 1028   MONOABS 2.1 (H)  08/04/2019 2012   MONOABS 1.1 (H) 09/02/2013 1028   EOSABS 0.0 08/04/2019 2012   EOSABS 0.0 09/02/2013 1028   BASOSABS 0.0 08/04/2019 2012   BASOSABS 0.0 09/02/2013 1028   Comprehensive Metabolic Panel:    Component Value Date/Time   NA 142 08/04/2019 2012   NA 143 12/06/2015 1035   NA 135 (L) 07/18/2014 1205   K 4.5 08/04/2019 2012   K 3.4 (L) 07/18/2014 1205   CL 107 08/04/2019 2012   CL 101 07/18/2014 1205   CO2 20 (L) 08/04/2019 2012   CO2 24 07/18/2014 1205   BUN 30 (H) 08/04/2019 2012   BUN 12 12/06/2015 1035   BUN 10 07/18/2014 1205   CREATININE 1.32 (H) 08/04/2019 2012   CREATININE 0.78 11/26/2017 1017   GLUCOSE 132 (H) 08/04/2019 2012   GLUCOSE 82 07/18/2014 1205   CALCIUM 8.4 (L) 08/04/2019 2012   CALCIUM 9.1 07/18/2014 1205   AST 19 08/04/2019 2012   AST 44 (H) 07/18/2014 1205   ALT 11 08/04/2019 2012   ALT 38 07/18/2014 1205   ALKPHOS 87 08/04/2019 2012   ALKPHOS 65 07/18/2014 1205   BILITOT 0.9 08/04/2019 2012   BILITOT 0.5 07/18/2014 1205   PROT 6.7 08/04/2019 2012   PROT 8.1 07/18/2014 1205   ALBUMIN 2.5 (L) 08/04/2019 2012   ALBUMIN 3.7 07/18/2014 1205    RADIOGRAPHIC STUDIES: DG Chest 1 View  Result Date: 08/04/2019 CLINICAL DATA:  Hypoxia EXAM: CHEST  1 VIEW COMPARISON:  07/21/2019 FINDINGS: There is a right-sided Port-A-Cath with the tip projecting over the SVC. There is no focal consolidation. There is no pleural effusion or pneumothorax. There is  stable cardiomegaly. There is a dual lead cardiac pacemaker. There is no acute osseous abnormality. IMPRESSION: No active disease. Electronically Signed   By: Kathreen Devoid   On: 08/04/2019 19:51   CT Chest W Contrast  Result Date: 07/22/2019 CLINICAL DATA:  History of lung cancer.  Restaging. EXAM: CT CHEST, ABDOMEN, AND PELVIS WITH CONTRAST TECHNIQUE: Multidetector CT imaging of the chest, abdomen and pelvis was performed following the standard protocol during bolus administration of intravenous contrast. CONTRAST:  153m OMNIPAQUE IOHEXOL 300 MG/ML  SOLN COMPARISON:  01/13/2019 FINDINGS: CT CHEST FINDINGS Cardiovascular: Cardiac enlargement. The small to moderate posterior pericardial effusion identified, image 111/6. Aortic atherosclerosis. Lad and RCA coronary artery calcifications. Mediastinum/Nodes: Thyroid gland not visualized and may be surgically absent The trachea appears patent and is midline. Normal appearance of the esophagus. No mediastinal, hilar, supraclavicular or axillary adenopathy. Lungs/Pleura: Paraseptal and centrilobular emphysema. No pleural effusion. Bandlike area of fibrosis and masslike architectural distortion within the perihilar left lung is again identified compatible with changes due to external beam radiation. The appearance is unchanged compared with the previous exam. No suspicious pulmonary nodule or mass identified at this time. Musculoskeletal: Healing left lateral rib deformities. Multi level degenerative disc disease identified throughout the thoracic spine. CT ABDOMEN PELVIS FINDINGS Hepatobiliary: No focal liver abnormality. Gallbladder is unremarkable. No biliary ductal dilatation. Pancreas: Similar appearance of mild increase caliber of the main duct. No inflammation. No mass Spleen: The spleen measures 9.7 x 6.3 by 15.6 cm (volume = 500 cm^3). New linear, bandlike area of hypoenhancement within the superior aspect of the spleen is identified, image 66/4 and is  concerning for a area of splenic laceration. Adrenals/Urinary Tract: Normal appearance of the adrenal glands. Right kidney cyst measures 1.7 cm. No mass or hydronephrosis. The urinary bladder is unremarkable. Stomach/Bowel: Stomach is within normal  limits. Appendix appears normal. No evidence of bowel wall thickening, distention, or inflammatory changes. Vascular/Lymphatic: Aortic atherosclerosis. No aneurysm. No abdominopelvic adenopathy identified. Reproductive: Status post hysterectomy. No adnexal masses. Other: There is a small to moderate volume of free fluid within the abdomen and pelvis. No discrete fluid collections. Musculoskeletal: Scoliosis and degenerative disc disease. No aggressive lytic or sclerotic bone lesions identified the at this time. IMPRESSION: 1. Stable changes of external beam radiation within the left lung without findings to suggest Lung cancer recurrence within the chest. 2. Significant increase in volume of the spleen. New bandlike area of low attenuation within the superior pole of spleen concerning for with age indeterminate splenic laceration. Alternatively, findings may reflect splenic infarct. 3. New small to moderate volume of free fluid within the abdomen and pelvis. Etiology is indeterminate but in the setting of splenic laceration could represent resolving hemoperitoneum. 4. Small pericardial effusion, new from previous study. 5. Healing left rib fractures. 6. Aortic Atherosclerosis (ICD10-I70.0) and Emphysema (ICD10-J43.9). Electronically Signed   By: Kerby Moors M.D.   On: 07/22/2019 10:40   CT Angio Chest PE W and/or Wo Contrast  Result Date: 07/31/2019 CLINICAL DATA:  Shortness of breath EXAM: CT ANGIOGRAPHY CHEST WITH CONTRAST TECHNIQUE: Multidetector CT imaging of the chest was performed using the standard protocol during bolus administration of intravenous contrast. Multiplanar CT image reconstructions and MIPs were obtained to evaluate the vascular anatomy.  CONTRAST:  4m OMNIPAQUE IOHEXOL 350 MG/ML SOLN COMPARISON:  07/22/2019 FINDINGS: Cardiovascular: Cardiomegaly with persistent small pericardial effusion. Pulmonary vasculature is adequately opacified. No filling defect to the segmental branch pulmonary arteries to suggest pulmonary embolism. Right-sided chest port and left-sided implanted cardiac device remain unchanged in positioning. Atherosclerotic calcification of the aorta and coronary arteries. Mediastinum/Nodes: No axillary, mediastinal, or hilar lymphadenopathy. Trachea and esophagus within normal limits. Lungs/Pleura: Post radiation changes with fibrosis and architectural distortion is again identified in the left perihilar region. Moderate emphysema. No focal airspace consolidation, pleural effusion, or pneumothorax. Upper Abdomen: Low-density area within the posterior aspect of the superior spleen. There also appears to be small amount of fluid around the spleen. No perihepatic ascites is evident. Musculoskeletal: Similar degenerative changes throughout the thoracic spine. Review of the MIP images confirms the above findings. IMPRESSION: 1. Negative for pulmonary embolus. 2. Stable post radiation changes in the left perihilar region. 3. Cardiomegaly with persistent small pericardial effusion. 4. Low-density area within the posterior aspect of the superior spleen, may represent a splenic infarct. Small amount of fluid around the spleen, which appears new from prior. 5. Emphysema and aortic atherosclerosis. Aortic Atherosclerosis (ICD10-I70.0) and Emphysema (ICD10-J43.9). Electronically Signed   By: NDavina PokeD.O.   On: 07/12/2019 12:43   CT Abdomen Pelvis W Contrast  Result Date: 07/22/2019 CLINICAL DATA:  History of lung cancer.  Restaging. EXAM: CT CHEST, ABDOMEN, AND PELVIS WITH CONTRAST TECHNIQUE: Multidetector CT imaging of the chest, abdomen and pelvis was performed following the standard protocol during bolus administration of  intravenous contrast. CONTRAST:  1074mOMNIPAQUE IOHEXOL 300 MG/ML  SOLN COMPARISON:  01/13/2019 FINDINGS: CT CHEST FINDINGS Cardiovascular: Cardiac enlargement. The small to moderate posterior pericardial effusion identified, image 111/6. Aortic atherosclerosis. Lad and RCA coronary artery calcifications. Mediastinum/Nodes: Thyroid gland not visualized and may be surgically absent The trachea appears patent and is midline. Normal appearance of the esophagus. No mediastinal, hilar, supraclavicular or axillary adenopathy. Lungs/Pleura: Paraseptal and centrilobular emphysema. No pleural effusion. Bandlike area of fibrosis and masslike architectural distortion within the perihilar left  lung is again identified compatible with changes due to external beam radiation. The appearance is unchanged compared with the previous exam. No suspicious pulmonary nodule or mass identified at this time. Musculoskeletal: Healing left lateral rib deformities. Multi level degenerative disc disease identified throughout the thoracic spine. CT ABDOMEN PELVIS FINDINGS Hepatobiliary: No focal liver abnormality. Gallbladder is unremarkable. No biliary ductal dilatation. Pancreas: Similar appearance of mild increase caliber of the main duct. No inflammation. No mass Spleen: The spleen measures 9.7 x 6.3 by 15.6 cm (volume = 500 cm^3). New linear, bandlike area of hypoenhancement within the superior aspect of the spleen is identified, image 66/4 and is concerning for a area of splenic laceration. Adrenals/Urinary Tract: Normal appearance of the adrenal glands. Right kidney cyst measures 1.7 cm. No mass or hydronephrosis. The urinary bladder is unremarkable. Stomach/Bowel: Stomach is within normal limits. Appendix appears normal. No evidence of bowel wall thickening, distention, or inflammatory changes. Vascular/Lymphatic: Aortic atherosclerosis. No aneurysm. No abdominopelvic adenopathy identified. Reproductive: Status post hysterectomy. No  adnexal masses. Other: There is a small to moderate volume of free fluid within the abdomen and pelvis. No discrete fluid collections. Musculoskeletal: Scoliosis and degenerative disc disease. No aggressive lytic or sclerotic bone lesions identified the at this time. IMPRESSION: 1. Stable changes of external beam radiation within the left lung without findings to suggest Lung cancer recurrence within the chest. 2. Significant increase in volume of the spleen. New bandlike area of low attenuation within the superior pole of spleen concerning for with age indeterminate splenic laceration. Alternatively, findings may reflect splenic infarct. 3. New small to moderate volume of free fluid within the abdomen and pelvis. Etiology is indeterminate but in the setting of splenic laceration could represent resolving hemoperitoneum. 4. Small pericardial effusion, new from previous study. 5. Healing left rib fractures. 6. Aortic Atherosclerosis (ICD10-I70.0) and Emphysema (ICD10-J43.9). Electronically Signed   By: Kerby Moors M.D.   On: 07/22/2019 10:40   DG Chest Port 1 View  Result Date: 07/16/2019 CLINICAL DATA:  Acute respiratory distress. EXAM: PORTABLE CHEST 1 VIEW COMPARISON:  CTA chest and chest x-ray from same day. FINDINGS: Unchanged left chest wall pacemaker. Unchanged right chest wall port catheter. Stable cardiomegaly. Normal pulmonary vascularity. Unchanged subtle increased density in the left upper lobe. No pleural effusion or pneumothorax. No acute osseous abnormality. IMPRESSION: 1. Unchanged subtle increased density in the left upper lobe, corresponding to faint ground-glass density seen on chest CT from earlier today, likely infectious or inflammatory. Electronically Signed   By: Titus Dubin M.D.   On: 07/15/2019 20:47   DG Chest Port 1 View  Result Date: 07/07/2019 CLINICAL DATA:  Dyspnea. EXAM: PORTABLE CHEST 1 VIEW COMPARISON:  July 30, 2019. FINDINGS: Stable cardiomegaly. No  pneumothorax or significant pleural effusion is noted. Right internal jugular Port-A-Cath is unchanged in position. Left-sided pacemaker is unchanged. Lungs are clear. Old left rib fractures are noted. IMPRESSION: No active disease. Electronically Signed   By: Marijo Conception M.D.   On: 07/23/2019 10:07   DG Chest Portable 1 View  Result Date: 07/30/2019 CLINICAL DATA:  Weakness and diarrhea. History of lung carcinoma and renal carcinoma. EXAM: PORTABLE CHEST 1 VIEW COMPARISON:  CT of the chest on 07/22/2019 and prior chest x-ray on 06/18/2019 FINDINGS: Stable cardiac enlargement and appearance of dual-chamber pacemaker. Stable appearance of Port-A-Cath. Stable chronic atelectasis and volume loss at the left lung base. There is no evidence of pulmonary edema, consolidation, pneumothorax, nodule or pleural fluid. The visualized skeletal  structures are unremarkable. IMPRESSION: Stable chest x-ray demonstrating stable cardiomegaly and chronic atelectasis at the left lung base. Electronically Signed   By: Aletta Edouard M.D.   On: 07/30/2019 13:40    PERFORMANCE STATUS (ECOG) : 3 - Symptomatic, >50% confined to bed  Review of Systems Unable to complete  Physical Exam General: Ill-appearing Cardiovascular: regular rate and rhythm Pulmonary: Intermittently labored appearing Extremities: no edema, no joint deformities Skin: no rashes Neurological: Lethargic  IMPRESSION: Patient is continued to decline.  She remains in ICU on pressors.  BiPAP was started overnight.  Today, SaO2 in the mid 80s despite being on FiO2 of 100%.  Patient is poorly responsive.  I met with all 4 daughters this morning.  We discussed patient's decline and poor prognosis.  All verbalized a desire to stop current treatments and just focus on comfort care.  All verbalized an understanding that patient appears to be at end-of-life.  They had some questions specific to her end-of-life care and funeral  arrangements.  PLAN: -Comfort care orders entered -DNR/DNI  Case discussed with nursing, RT, Dr. Posey Pronto, and Dr. Grayland Ormond.  Time Total: 35 minutes  Visit consisted of counseling and education dealing with the complex and emotionally intense issues of symptom management and palliative care in the setting of serious and potentially life-threatening illness.Greater than 50%  of this time was spent counseling and coordinating care related to the above assessment and plan.  Signed by: Altha Harm, PhD, NP-C

## 2019-08-06 NOTE — Progress Notes (Signed)
Chaplin visited with family. Daughters will let me know when they are ready to proceed with comfort care measures.

## 2019-08-06 NOTE — Progress Notes (Signed)
Called CDS regarding patients family wanting to make patient comfort care at 10:00am. Ref # [id number], Spoke with Roderic Ovens. At this time patient is not a candidate for organ donation. Will call back with time of death.

## 2019-08-06 NOTE — Progress Notes (Signed)
Patient passed at 15:51 2019-08-13. Patients four daughters at bedside. Patients EKG asystole, no lung or heart sounds heard when auscultated. Pronounced by Elenora Gamma RN and Dillion W RN. Dr. Posey Pronto, supervisor and CDS notified. Per CDS patient is a full body release. [place] called per families request. Family will advise once they have picked a funeral home.

## 2019-08-06 NOTE — Progress Notes (Signed)
   2019-09-02 1100  Clinical Encounter Type  Visited With Patient and family together;Health care provider  Visit Type Follow-up;Spiritual support  Referral From Nurse;Palliative care team  Spiritual Encounters  Spiritual Needs Prayer;Emotional  Stress Factors  Family Stress Factors Health changes;Loss;Major life changes   Chaplain received a page to support the daughters of this patient. Upon arrival, the patient was observed to be laying in the bed with her four daughters at the bedside. The patient's daughters are experiencing anticipatory grief and they shared the difficulty of witnessing her decline over the past month. [name], the patient's eldest daughter shared that their mother has been a strong, courageous woman who has been a single mother for most of their lives; the affirm that she has also taught them to be strong women The family affirm that they are people who are grounded in their strong [place]. The family also expressed that they are grateful that their mother has lived 41 years because in their mother's family, longevity has not been common. The patient's daughters shared some of their familial history and engaged in a life review together. The family welcomed prayer for their mother and their family's peace and comfort at this difficult time. The family will let the nurse know when they are ready to move forward.

## 2019-08-06 NOTE — Progress Notes (Signed)
PHARMACY - PHYSICIAN COMMUNICATION CRITICAL VALUE ALERT - BLOOD CULTURE IDENTIFICATION (BCID)  Caroline Nichols is an 79 y.o. female who presented to [facility] on 07/[facility]/2019 with a chief complaint of weakness, fatigue, poor PO intake, diarrhea  Assessment:  Admitted w/ sepsis growing 1/4 GPC no BCID.  Name of physician (or Provider) Contacted: N/A  Current antibiotics: cefepime, vancomycin  Changes to prescribed antibiotics recommended:  Patient is being placed on comfort care measures in the morning once family can be present.  No results found for this or any previous visit.  Tobie Lords, PharmD, BCPS Clinical Pharmacist 08/18/2019  5:24 AM

## 2019-08-06 NOTE — Progress Notes (Signed)
[facility] [place] NAME: Caroline Nichols    MR#:  [id number]  DATE OF BIRTH:  1940-08-09  SUBJECTIVE:  patient overall deteriorated overnight. Had to be placed on higher fire to. She is unresponsive. On pressers.  Daughters in the room. Patient is now comfort care and on IV morphine drip  REVIEW OF SYSTEMS:   Review of Systems  Unable to perform ROS: Patient unresponsive   Tolerating Diet: Tolerating PT:   DRUG ALLERGIES:   Allergies  Allergen Reactions  . Metrizamide     Other reaction(s): Other (See Comments)  . Adhesive [Tape] Other (See Comments)    "irritate skin"  . Augmentin [Amoxicillin-Pot Clavulanate] Diarrhea    Severe diarrhea  . Codeine Nausea Only  . Morphine And Related Other (See Comments)    Shut down her organs  . Other     Skin irritation  . Tetanus Toxoid Other (See Comments)    REACTION: ARM SWELLING,REDNESS  . Tramadol Nausea And Vomiting    VITALS:  Blood pressure 104/61, pulse (!) 27, temperature 98.8 F (37.1 C), resp. rate (!) 24, height '5\' 2"'  (1.575 m), weight 60.8 kg, SpO2 90 %.  PHYSICAL EXAMINATION:   Physical Exam  GENERAL:  79 y.o.-year-old patient lying in the bed with no acute distress. Chronically ill  LUNGS: Normal breath sounds bilaterally, no wheezing, rales, rhonchi. No use of accessory muscles of respiration.  CARDIOVASCULAR: S1, S2 normal. No murmurs, rubs, or gallops.  ABDOMEN: Soft, nontender, nondistended. Bowel sounds present.  Patient lethargic/unresponsive  LABORATORY PANEL:  CBC Recent Labs  Lab 08/04/19 2012  WBC 12.6*  HGB 8.0*  HCT 27.0*  PLT 67*    Chemistries  Recent Labs  Lab 08/04/19 0500 08/04/19 2012  NA 141 142  K 4.1 4.5  CL 105 107  CO2 26 20*  GLUCOSE 104* 132*  BUN 24* 30*  CREATININE 0.82 1.32*  CALCIUM 8.4* 8.4*  MG 1.8  --   AST 14* 19  ALT 11 11  ALKPHOS 65 87  BILITOT 0.8 0.9   Cardiac Enzymes No results for input(s): TROPONINI  in the last 168 hours. RADIOLOGY:  DG Chest 1 View  Result Date: 08/04/2019 CLINICAL DATA:  Hypoxia EXAM: CHEST  1 VIEW COMPARISON:  07/28/2019 FINDINGS: There is a right-sided Port-A-Cath with the tip projecting over the SVC. There is no focal consolidation. There is no pleural effusion or pneumothorax. There is stable cardiomegaly. There is a dual lead cardiac pacemaker. There is no acute osseous abnormality. IMPRESSION: No active disease. Electronically Signed   By: Kathreen Devoid   On: 08/04/2019 19:51   DG Chest Port 1 View  Result Date: 07/17/2019 CLINICAL DATA:  Acute respiratory distress. EXAM: PORTABLE CHEST 1 VIEW COMPARISON:  CTA chest and chest x-ray from same day. FINDINGS: Unchanged left chest wall pacemaker. Unchanged right chest wall port catheter. Stable cardiomegaly. Normal pulmonary vascularity. Unchanged subtle increased density in the left upper lobe. No pleural effusion or pneumothorax. No acute osseous abnormality. IMPRESSION: 1. Unchanged subtle increased density in the left upper lobe, corresponding to faint ground-glass density seen on chest CT from earlier today, likely infectious or inflammatory. Electronically Signed   By: Titus Dubin M.D.   On: 07/25/2019 20:47   ASSESSMENT AND PLAN:   Caroline Nichols is a 79 y.o. female with medical history significant of chronic anemia, anxiety, rheumatoid arthritis, diabetes insipidus, GERD, splenic laceration, hyperlipidemia, hypertension.  Pain also had a  history of 2 separate primary cancers including high risk myelodysplastic syndrome not amenable to chemotherapy currently as well as stage IIb large cell neuroendocrine tumor.Patient presented to the hospital today with chief complaint of worsening fatigue, dyspnea, diarrhea, poor p.o. intake.   #SIRS suspected due to Acute on chronic diarrhea with significant weakness and functional decline with  Poor p.o. intake  #High risk myelodysplastic syndrome #Stage IIIb large cell  neuroendocrine lung tumor #hypotension #chronic Depression Anxiety #Pain, likely cancer related # Recent splenic infarct/laceration July 22, 2019 CT abdomen  Patient overall decline. Palliative care met with all the four daughters this morning. They understand poor prognosis. The requested comfort care. IV morphine drip initiated.  Patient is DNR DNI  Family communication : with daughters in the ICU  consults : ID, ICU, palliative care, oncology CODE STATUS: DNR DVT Prophylaxis : comfort care  TOTAL TIME TAKING CARE OF THIS PATIENT: *15** minutes.  >50% time spent on counselling and coordination of care  POSSIBLE D/C IN **?* DAYS, DEPENDING ON CLINICAL CONDITION.  Note: This dictation was prepared with Dragon dictation along with smaller phrase technology. Any transcriptional errors that result from this process are unintentional.  Fritzi Mandes M.D on 18-Aug-2019 at 3:28 PM  Between 7am to 6pm - Pager - [phone]  After 6pm go to [email or web address]  [facility]   CC: Primary care physician; [name], MDPatient ID: Caroline Nichols, female   DOB: 1940/11/02, 79 y.o.   MRN: [id number]

## 2019-08-06 NOTE — Progress Notes (Deleted)
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  HISTORY OF PRESENT ILLNESS: Patient returns to clinic today for repeat laboratory work and further evaluation.  She continues to have significant weakness and fatigue.  Patient states she has a poor appetite.  Every time she eats, she has significant abdominal pain and nausea and does not wish to eat any further. She does not complain of shortness of breath today.  She denies any recent fevers or illnesses. She has no neurologic complaints.  She denies any chest pain, cough, or hemoptysis.  She denies any constipation or diarrhea.  She has no urinary complaints.  Patient continues to feel generally terrible, but offers no further specific complaints.  REVIEW OF SYSTEMS:    Review of Systems  Constitutional: Positive for malaise/fatigue. Negative for fever and weight loss.  HENT: Negative.   Eyes: Negative.  Negative for blurred vision, double vision and pain.  Respiratory: Negative.  Negative for cough and shortness of breath.   Cardiovascular: Negative.  Negative for chest pain and leg swelling.  Gastrointestinal: Positive for abdominal pain and nausea. Negative for blood in stool, diarrhea and melena.  Genitourinary: Negative.  Negative for dysuria.  Musculoskeletal: Negative.  Negative for falls.  Skin: Negative.  Negative for rash.  Neurological: Positive for sensory change and weakness. Negative for focal weakness and headaches.    Psychiatric/Behavioral: Negative.  The patient is not nervous/anxious.   All other systems reviewed and are negative.   PAST MEDICAL HISTORY: Past Medical History:  Diagnosis Date  . Anemia 02/2013   F/U with Dr. Grayland Ormond for Feraheme injections  . Anxiety   . Arthritis    Possible RA - Follow up appt with Dr. Jefm Bryant  . Cellulitis of arm, right   . Chest pain 03/26/2013  . Diabetes insipidus ([place])    On desmopressin  . GERD (gastroesophageal reflux disease)   . H/O seasonal allergies   . H/O transfusion of packed red blood cells 02/2013   3 units PRBC for severe anemia 02/2013  . Herniated disc   . History of kidney stones   . Hyperlipidemia   . Hypertension   . Hyperthyroidism    s/p radiation therapy, now hypothyroidism  . IBS (irritable bowel syndrome)   . Lung cancer ([place]) 06/2017   left  . Migraine   . Pacemaker    a. s/p successful implantation of a Medtronic CapSureFix Novus MRIT SureScan serial # [id number] on 06/28/16 by Dr. Caryl Comes for sinus node dysfunction.  . Pancreatic cyst   . Paroxysmal A-fib ([place])   . Renal cell cancer, right ([place]) 10/23/2015   Right partial nephrectomy.    PAST SURGICAL HISTORY: Past Surgical History:  Procedure Laterality Date  . ABDOMINAL HYSTERECTOMY    . BREAST EXCISIONAL BIOPSY Right 2005   neg  . BREAST SURGERY  1990s   Biopsy  . CERVICAL SPINE SURGERY     Bone fusion  . COLONOSCOPY  2008?   [place]  . COLONOSCOPY WITH PROPOFOL N/A 10/15/2016   Procedure: COLONOSCOPY  WITH PROPOFOL;  Surgeon: Robert Bellow, MD;  Location: [facility] ENDOSCOPY;  Service: Endoscopy;  Laterality: N/A;  . EP IMPLANTABLE DEVICE N/A 06/28/2016   Procedure: Pacemaker Implant;  Surgeon: Deboraha Sprang, MD;  Location: [facility] CV LAB;  Service: Cardiovascular;  Laterality: N/A;  . HAMMER TOE SURGERY    . HERNIA REPAIR Left    Inguinal Hernia Repair  . PORTA CATH INSERTION N/A 07/24/2017   Procedure: PORTA CATH INSERTION;  Surgeon: Algernon Huxley, MD;  Location: [facility] LAB;  Service: Cardiovascular;  Laterality: N/A;  . ROBOTIC ASSITED PARTIAL NEPHRECTOMY Right 10/23/2015   Procedure: ROBOTIC ASSITED PARTIAL NEPHRECTOMY with intraop ultrasound;  Surgeon: Hollice Espy, MD;  Location: [facility] ORS;  Service: Urology;  Laterality: Right;  . TUBAL LIGATION      FAMILY HISTORY Family History  Problem Relation Age of Onset  . Heart disease Mother   . Heart disease Father   . Diabetes Brother   . Kidney disease Brother   . Stroke Daughter        due to medication reaction  . Breast cancer Cousin   . Prostate cancer Neg Hx   . Hematuria Neg Hx     ADVANCED DIRECTIVES:  No flowsheet data found.  HEALTH MAINTENANCE: Social History   Tobacco Use  . Smoking status: Former Smoker    Packs/day: 0.50    Years: 30.00    Pack years: 15.00    Types: Cigarettes    Quit date: 12/03/2012    Years since quitting: 6.6  . Smokeless tobacco: Never Used  Substance Use Topics  . Alcohol use: No    Alcohol/week: 0.0 standard drinks  . Drug use: No     Allergies  Allergen Reactions  . Metrizamide     Other reaction(s): Other (See Comments)  . Adhesive [Tape] Other (See Comments)    "irritate skin"  . Augmentin [Amoxicillin-Pot Clavulanate] Diarrhea    Severe diarrhea  . Codeine Nausea Only  . Morphine And Related Other (See Comments)    Shut down her organs  . Other     Skin irritation  . Tetanus Toxoid Other (See Comments)    REACTION: ARM SWELLING,REDNESS  . Tramadol Nausea And Vomiting    Current Outpatient Medications  Medication Sig Dispense Refill  . acetaminophen (TYLENOL) 325 MG tablet Take 2 tablets (650 mg total) by mouth every 6 (six) hours as needed for pain. (Patient taking differently: Take 650 mg by mouth every 6 (six) hours as needed for pain. )    . albuterol (PROVENTIL HFA;VENTOLIN HFA) 108 (90 Base) MCG/ACT inhaler Inhale 2 puffs into the lungs every 6 (six) hours as needed for wheezing or  shortness of breath. 1 Inhaler 2  . ALPRAZolam (XANAX) 0.25 MG tablet TAKE ONE TABLET BY MOUTH TWICE A DAY AS NEEDED FOR ANXIETY 60 tablet 2  . brimonidine (ALPHAGAN) 0.2 % ophthalmic solution Place 1 drop into both eyes 3 (three) times daily.     . Calcium Carbonate-Vitamin D (CALCIUM-CARB 600 + D) 600-125 MG-UNIT TABS Take 2 tablets by mouth daily.    . carvedilol (COREG) 6.25 MG tablet 1.5 tablets twice daily with meals 270 tablet 0  . cholecalciferol (VITAMIN D) 1000 units tablet Take 1,000 Units by mouth daily.    . dorzolamide (TRUSOPT) 2 % ophthalmic solution Place 1 drop into both eyes 3 (three) times daily.     . ferrous sulfate 325 (65 FE) MG tablet Take 325 mg by mouth  daily with breakfast.    . furosemide (LASIX) 20 MG tablet TAKE ONE-HALF TABLET BY MOUTH DAILY (Patient taking differently: Take 10 mg by mouth daily. ) 45 tablet 3  . gabapentin (NEURONTIN) 300 MG capsule TAKE ONE CAPSULE BY MOUTH THREE TIMES A DAY AS NEEDED FOR SHINGLES 90 capsule 0  . HYDROcodone-acetaminophen (NORCO) 10-325 MG tablet Take 1 tablet by mouth every 6 (six) hours as needed. 30 tablet 0  . hydroxychloroquine (PLAQUENIL) 200 MG tablet Take 400 mg by mouth daily.     Marland Kitchen latanoprost (XALATAN) 0.005 % ophthalmic solution Place 1 drop into both eyes at bedtime.     Marland Kitchen levothyroxine (SYNTHROID) 112 MCG tablet Take 1 tablet (112 mcg total) by mouth every morning. 90 tablet 0  . losartan (COZAAR) 100 MG tablet Take 1 tablet (100 mg total) by mouth at bedtime. 90 tablet 0  . mirtazapine (REMERON) 15 MG tablet Take 1 tablet (15 mg total) by mouth at bedtime. 30 tablet 2  . MYRBETRIQ 50 MG TB24 tablet TAKE ONE TABLET BY MOUTH DAILY (Patient taking differently: Take 50 mg by mouth daily. ) 30 tablet 4  . nitroGLYCERIN (NITROLINGUAL) 0.4 MG/SPRAY spray Place 1 spray under the tongue every 5 (five) minutes x 3 doses as needed for chest pain. 4.9 g 3  . Omega-3 Fatty Acids (FISH OIL) 1000 MG CAPS Take 1,000 mg by mouth  daily.     . ondansetron (ZOFRAN ODT) 4 MG disintegrating tablet Take 1 tablet (4 mg total) by mouth every 8 (eight) hours as needed for nausea or vomiting. 20 tablet 0  . oxybutynin (DITROPAN-XL) 10 MG 24 hr tablet TAKE ONE TABLET BY MOUTH AT BEDTIME 30 tablet 0  . pantoprazole (PROTONIX) 40 MG tablet Take 1 tablet (40 mg total) by mouth 2 (two) times daily. 60 tablet 3  . PARoxetine (PAXIL) 10 MG tablet TAKE ONE TABLET BY MOUTH DAILY AFTER DINNER (Patient taking differently: Take 10 mg by mouth daily after supper. ) 90 tablet 1  . predniSONE (DELTASONE) 10 MG tablet 6 tablets daily for 3 days,  then reduce by 1 tablet daily until gone 33 tablet 0  . vitamin C (ASCORBIC ACID) 500 MG tablet Take 500 mg by mouth daily.     No current facility-administered medications for this visit.    OBJECTIVE:  There were no vitals filed for this visit.   There is no height or weight on file to calculate BMI.    ECOG FS:2 - Symptomatic, <50% confined to bed  General: Ill-appearing, no acute distress.  Sitting in a wheelchair. Eyes: Pink conjunctiva, anicteric sclera. HEENT: Normocephalic, moist mucous membranes. Lungs: No audible wheezing or coughing. Heart: Regular rate and rhythm. Abdomen: Soft, nontender, no obvious distention. Musculoskeletal: No edema, cyanosis, or clubbing. Neuro: Alert, answering all questions appropriately. Cranial nerves grossly intact. Skin: No rashes or petechiae noted. Psych: Normal affect.   LAB RESULTS:  No visits with results within 5 Day(s) from this visit.  Latest known visit with results is:  Infusion on 07/28/2019  Component Date Value Ref Range Status  . Blood Bank Specimen 07/28/2019 SAMPLE AVAILABLE FOR TESTING   Final  . Sample Expiration 07/28/2019    Final                   Value:07/31/2019,2359 Performed at [facility], [address] [place], [place] [address]   . WBC 07/28/2019 7.6  4.0 - 10.5 K/uL Final   WHITE COUNT CONFIRMED ON  SMEAR   . RBC 07/28/2019 3.05* 3.87 - 5.11 MIL/uL Final  . Hemoglobin 07/28/2019 8.8* 12.0 - 15.0 g/dL Final  . HCT 07/28/2019 28.2* 36.0 - 46.0 % Final  . MCV 07/28/2019 92.5  80.0 - 100.0 fL Final  . MCH 07/28/2019 28.9  26.0 - 34.0 pg Final  . MCHC 07/28/2019 31.2  30.0 - 36.0 g/dL Final  . RDW 07/28/2019 16.2* 11.5 - 15.5 % Final  . Platelets 07/28/2019 115* 150 - 400 K/uL Final  . nRBC 07/28/2019 1.5* 0.0 - 0.2 % Final  . Neutrophils Relative % 07/28/2019 35  % Final  . Neutro Abs 07/28/2019 2.8  1.7 - 7.7 K/uL Final  . Lymphocytes Relative 07/28/2019 20  % Final  . Lymphs Abs 07/28/2019 1.5  0.7 - 4.0 K/uL Final  . Monocytes Relative 07/28/2019 18  % Final  . Monocytes Absolute 07/28/2019 1.3* 0.1 - 1.0 K/uL Final  . Eosinophils Relative 07/28/2019 0  % Final  . Eosinophils Absolute 07/28/2019 0.0  0.0 - 0.5 K/uL Final  . Basophils Relative 07/28/2019 3  % Final  . Basophils Absolute 07/28/2019 0.2* 0.0 - 0.1 K/uL Final  . WBC Morphology 07/28/2019 MODERATE LEFT SHIFT (>5% METAS AND MYELOS,OCC PRO NOTED)   Final  . Smear Review 07/28/2019 PLATELETS APPEAR DECREASED   Final   PLALETLETS VARY IN SIZE AND GRANULATION  . Immature Granulocytes 07/28/2019 24  % Final  . Abs Immature Granulocytes 07/28/2019 1.78* 0.00 - 0.07 K/uL Final   Performed at [facility], [address], [place], [place] [address]       STUDIES: CT Chest W Contrast  Result Date: 07/22/2019 CLINICAL DATA:  History of lung cancer.  Restaging. EXAM: CT CHEST, ABDOMEN, AND PELVIS WITH CONTRAST TECHNIQUE: Multidetector CT imaging of the chest, abdomen and pelvis was performed following the standard protocol during bolus administration of intravenous contrast. CONTRAST:  132m OMNIPAQUE IOHEXOL 300 MG/ML  SOLN COMPARISON:  01/13/2019 FINDINGS: CT CHEST FINDINGS Cardiovascular: Cardiac enlargement. The small to moderate posterior pericardial effusion identified, image 111/6. Aortic atherosclerosis. Lad and RCA  coronary artery calcifications. Mediastinum/Nodes: Thyroid gland not visualized and may be surgically absent The trachea appears patent and is midline. Normal appearance of the esophagus. No mediastinal, hilar, supraclavicular or axillary adenopathy. Lungs/Pleura: Paraseptal and centrilobular emphysema. No pleural effusion. Bandlike area of fibrosis and masslike architectural distortion within the perihilar left lung is again identified compatible with changes due to external beam radiation. The appearance is unchanged compared with the previous exam. No suspicious pulmonary nodule or mass identified at this time. Musculoskeletal: Healing left lateral rib deformities. Multi level degenerative disc disease identified throughout the thoracic spine. CT ABDOMEN PELVIS FINDINGS Hepatobiliary: No focal liver abnormality. Gallbladder is unremarkable. No biliary ductal dilatation. Pancreas: Similar appearance of mild increase caliber of the main duct. No inflammation. No mass Spleen: The spleen measures 9.7 x 6.3 by 15.6 cm (volume = 500 cm^3). New linear, bandlike area of hypoenhancement within the superior aspect of the spleen is identified, image 66/4 and is concerning for a area of splenic laceration. Adrenals/Urinary Tract: Normal appearance of the adrenal glands. Right kidney cyst measures 1.7 cm. No mass or hydronephrosis. The urinary bladder is unremarkable. Stomach/Bowel: Stomach is within normal limits. Appendix appears normal. No evidence of bowel wall thickening, distention, or inflammatory changes. Vascular/Lymphatic: Aortic atherosclerosis. No aneurysm. No abdominopelvic adenopathy identified. Reproductive: Status post hysterectomy. No adnexal masses. Other: There is a small to moderate volume of free fluid within the  abdomen and pelvis. No discrete fluid collections. Musculoskeletal: Scoliosis and degenerative disc disease. No aggressive lytic or sclerotic bone lesions identified the at this time. IMPRESSION:  1. Stable changes of external beam radiation within the left lung without findings to suggest Lung cancer recurrence within the chest. 2. Significant increase in volume of the spleen. New bandlike area of low attenuation within the superior pole of spleen concerning for with age indeterminate splenic laceration. Alternatively, findings may reflect splenic infarct. 3. New small to moderate volume of free fluid within the abdomen and pelvis. Etiology is indeterminate but in the setting of splenic laceration could represent resolving hemoperitoneum. 4. Small pericardial effusion, new from previous study. 5. Healing left rib fractures. 6. Aortic Atherosclerosis (ICD10-I70.0) and Emphysema (ICD10-J43.9). Electronically Signed   By: Kerby Moors M.D.   On: 07/22/2019 10:40   CT Abdomen Pelvis W Contrast  Result Date: 07/22/2019 CLINICAL DATA:  History of lung cancer.  Restaging. EXAM: CT CHEST, ABDOMEN, AND PELVIS WITH CONTRAST TECHNIQUE: Multidetector CT imaging of the chest, abdomen and pelvis was performed following the standard protocol during bolus administration of intravenous contrast. CONTRAST:  173m OMNIPAQUE IOHEXOL 300 MG/ML  SOLN COMPARISON:  01/13/2019 FINDINGS: CT CHEST FINDINGS Cardiovascular: Cardiac enlargement. The small to moderate posterior pericardial effusion identified, image 111/6. Aortic atherosclerosis. Lad and RCA coronary artery calcifications. Mediastinum/Nodes: Thyroid gland not visualized and may be surgically absent The trachea appears patent and is midline. Normal appearance of the esophagus. No mediastinal, hilar, supraclavicular or axillary adenopathy. Lungs/Pleura: Paraseptal and centrilobular emphysema. No pleural effusion. Bandlike area of fibrosis and masslike architectural distortion within the perihilar left lung is again identified compatible with changes due to external beam radiation. The appearance is unchanged compared with the previous exam. No suspicious pulmonary  nodule or mass identified at this time. Musculoskeletal: Healing left lateral rib deformities. Multi level degenerative disc disease identified throughout the thoracic spine. CT ABDOMEN PELVIS FINDINGS Hepatobiliary: No focal liver abnormality. Gallbladder is unremarkable. No biliary ductal dilatation. Pancreas: Similar appearance of mild increase caliber of the main duct. No inflammation. No mass Spleen: The spleen measures 9.7 x 6.3 by 15.6 cm (volume = 500 cm^3). New linear, bandlike area of hypoenhancement within the superior aspect of the spleen is identified, image 66/4 and is concerning for a area of splenic laceration. Adrenals/Urinary Tract: Normal appearance of the adrenal glands. Right kidney cyst measures 1.7 cm. No mass or hydronephrosis. The urinary bladder is unremarkable. Stomach/Bowel: Stomach is within normal limits. Appendix appears normal. No evidence of bowel wall thickening, distention, or inflammatory changes. Vascular/Lymphatic: Aortic atherosclerosis. No aneurysm. No abdominopelvic adenopathy identified. Reproductive: Status post hysterectomy. No adnexal masses. Other: There is a small to moderate volume of free fluid within the abdomen and pelvis. No discrete fluid collections. Musculoskeletal: Scoliosis and degenerative disc disease. No aggressive lytic or sclerotic bone lesions identified the at this time. IMPRESSION: 1. Stable changes of external beam radiation within the left lung without findings to suggest Lung cancer recurrence within the chest. 2. Significant increase in volume of the spleen. New bandlike area of low attenuation within the superior pole of spleen concerning for with age indeterminate splenic laceration. Alternatively, findings may reflect splenic infarct. 3. New small to moderate volume of free fluid within the abdomen and pelvis. Etiology is indeterminate but in the setting of splenic laceration could represent resolving hemoperitoneum. 4. Small pericardial  effusion, new from previous study. 5. Healing left rib fractures. 6. Aortic Atherosclerosis (ICD10-I70.0) and Emphysema (ICD10-J43.9). Electronically Signed  By: Kerby Moors M.D.   On: 07/22/2019 10:40   CT BONE MARROW BIOPSY & ASPIRATION  Result Date: 07/05/2019 INDICATION: Anemia. EXAM: CT-DIRECTED BONE MARROW BIOPSY AND ASPIRATION. MEDICATIONS: None. ANESTHESIA/SEDATION: Moderate (conscious) sedation was employed during this procedure. A total of Versed 4.0 mg Moderate Sedation Time: 35 minutes. The patient's level of consciousness and vital signs were monitored continuously by radiology nursing throughout the procedure under my direct supervision. FLUOROSCOPY TIME:  Fluoroscopy Time: 0 minutes 0 seconds COMPLICATIONS: None immediate. PROCEDURE: After discussing risks and benefits of this procedure with the patient informed consent was obtained. Back was sterilely prepped and draped. Following local anesthesia with 1% lidocaine and IV conscious sedation with 4.0 mg of Versed. Standard bone marrow biopsy and aspiration was performed. Two core samples obtained. Aspirate and core samples sent to lab for further evaluation. IMPRESSION: Successful CT-directed bone marrow biopsy and aspiration. 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  Plan:  1.  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  The complexity of patient's karyotype and the loss of 17p are indicative of a high-grade MDS with a poor prognosis.  We discussed the possibility of chemotherapy, possibly with Vidaza.  Given patient's poor performance status, this is not recommended at this time.  Patient's hemoglobin has significantly improved and is now 8.8.  She does not require transfusion today.  Return to clinic in 1 week  with repeat laboratory work and further evaluation.   2. 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  Patient completed cycle 3 of carboplatinum and etoposide on September 19, 2017.  She also completed XRT.  Given her persistent pancytopenia, treatment was discontinued.  Patient's most recent CT scan on July 22, 2019 reviewed independently and reported as above with no obvious evidence of recurrent or progressive disease.  Repeat imaging in June 2021.  3.  Pancytopenia: Secondary to underlying MDS.  Monitor. 4. Renal cell carcinoma of the right kidney: Stage T1a N0 M0.  Patient underwent partial nephrectomy on October 23, 2015.  Repeat CT scan as above.  Patient is no longer followed by urology. 5.  Rheumatoid arthritis: Continue Plaquenil. Continue follow-up per rheumatology.  6.  Abdominal pain: CT scan as above suggest an age indeterminate splenic laceration or possible splenic infarct.  This may be the etiology of her pain.  Although given that her symptoms worsen with p.o. intake, have instructed patient to increase her pantoprazole to twice a day and a referral was given to both GI and dietary. 7.  Indeterminate splenic laceration/splenic infarct: Consider repeat imaging in 6 to 8 weeks to ensure resolution.   Patient expressed understanding and was in agreement with this plan. She also understands that She can call clinic at any time with any questions, concerns, or complaints.    Lloyd Huger, MD   07/29/2019 10:13 AM

## 2019-08-06 NOTE — Progress Notes (Signed)
OT Cancellation Note  Patient Details Name: Caroline Nichols MRN: [id number] DOB: 06/20/1941   Cancelled Treatment:    Reason Eval/Treat Not Completed: Patient not medically ready. Consult received, chart reviewed. Recent vitals indicating pt on BiPAP, HR 27, RR 24. Per hospitalist note from overnight, family "decided not to escalate care during the night and pursue comfort measures with removal of bipap in the morning when they can be present."  Will continue to follow acutely for appropriateness of therapy intervention.   Jeni Salles, MPH, MS, OTR/L ascom [phone] 08/16/19, 8:14 AM

## 2019-08-06 NOTE — Progress Notes (Signed)
After discussion with daughter regarding decline in organ functioning and care options, including comfort care, patient discussed with siblings over phone and decided not to escalate care during the night and pursue comfort measures with removal of bipap in the morning when they can be present

## 2019-08-06 DEATH — deceased

## 2019-08-09 LAB — CULTURE, BLOOD (ROUTINE X 2)
Culture: NO GROWTH
Special Requests: ADEQUATE

## 2019-11-04 ENCOUNTER — Ambulatory Visit: Payer: Medicare Other | Admitting: Internal Medicine

## 2019-11-26 ENCOUNTER — Encounter (INDEPENDENT_AMBULATORY_CARE_PROVIDER_SITE_OTHER): Payer: Medicare Other

## 2019-11-26 ENCOUNTER — Ambulatory Visit (INDEPENDENT_AMBULATORY_CARE_PROVIDER_SITE_OTHER): Payer: Medicare Other | Admitting: Vascular Surgery

## 2020-02-22 IMAGING — CT CT ABD-PELV W/ CM
2 of 5 series · 13 of 46 positions shown, 15 images · IV contrast (omnipaque)
Comparison: 01/13/2019

CLINICAL DATA: History of lung cancer.  Restaging.

EXAM:
CT CHEST, ABDOMEN, AND PELVIS WITH CONTRAST
TECHNIQUE: Multidetector CT imaging of the chest, abdomen and pelvis was
performed following the standard protocol during bolus
administration of intravenous contrast.
CONTRAST:  100mL OMNIPAQUE IOHEXOL 300 MG/ML  SOLN

[Series 2: axials cap 5.00 · axial · 0.69mm/px · z∈[-1785,-1285]mm · 10 of 121 slices shown, 12 images]
[im 11/121  soft-tissue]
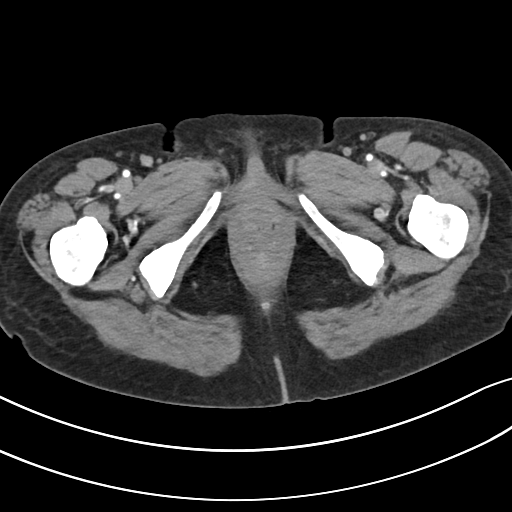
[im 11/121  bone]
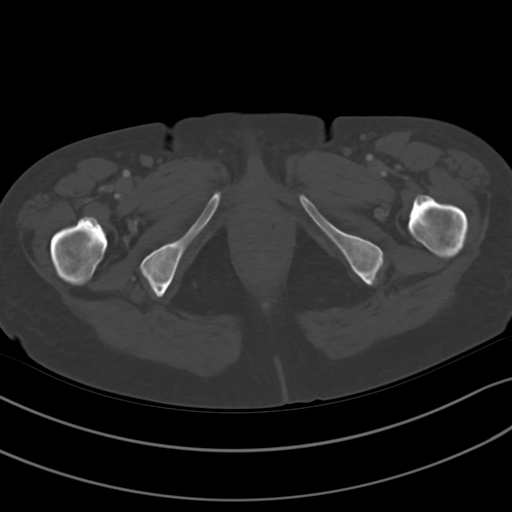
[im 21/121  soft-tissue]
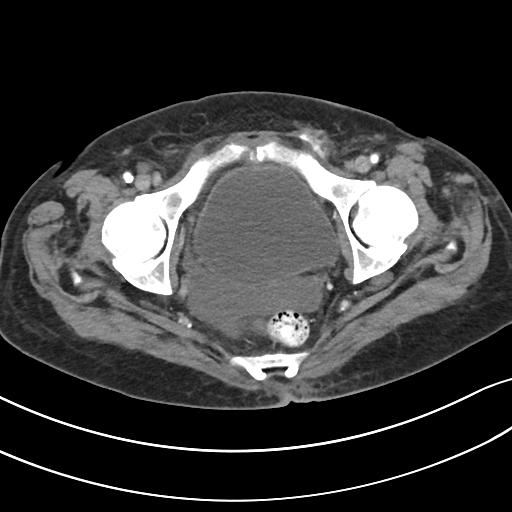
[im 31/121  soft-tissue]
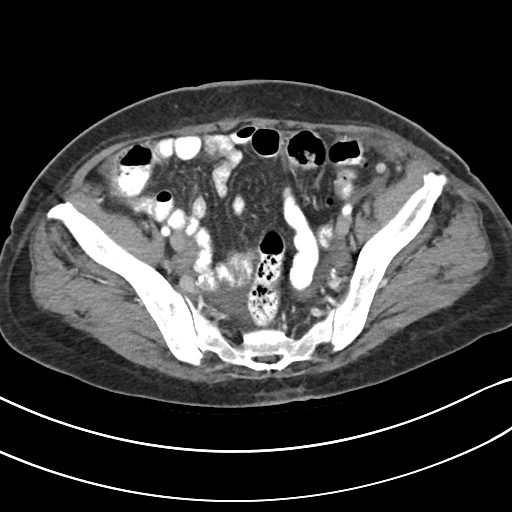
[im 41/121  soft-tissue]
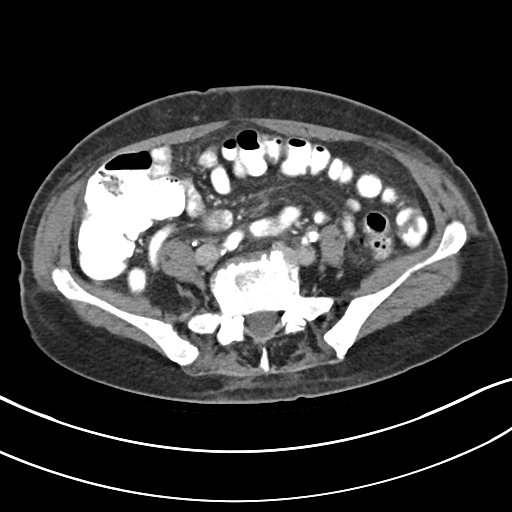
[im 51/121  soft-tissue]
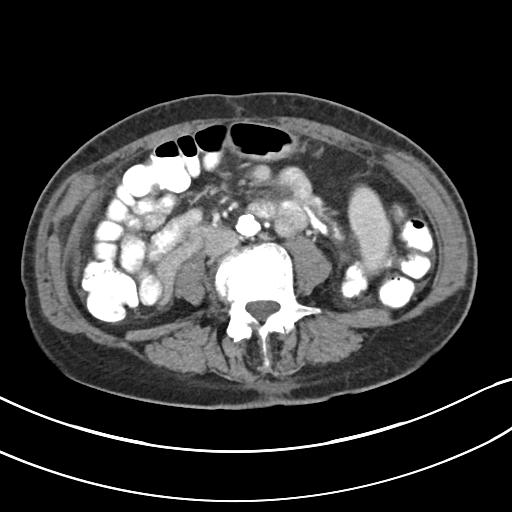
[im 71/121  soft-tissue]
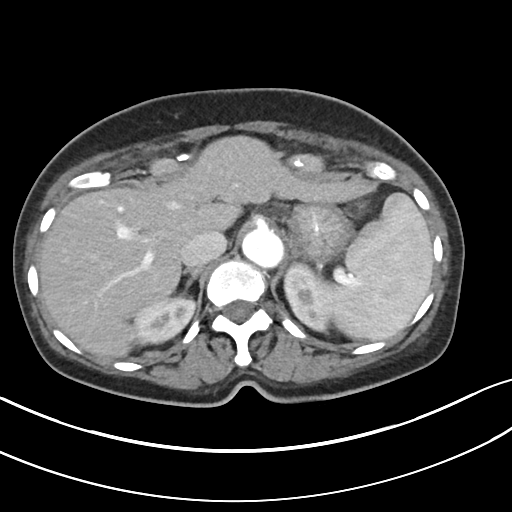
[im 81/121  soft-tissue]
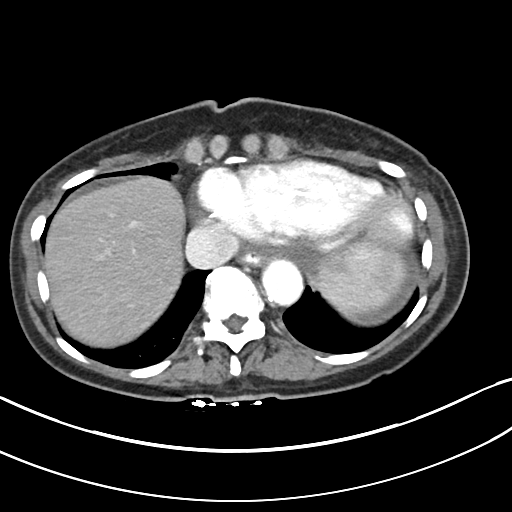
[im 91/121  soft-tissue]
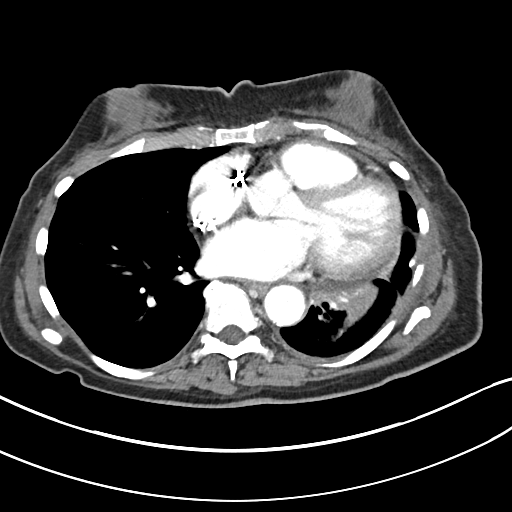
[im 101/121  soft-tissue]
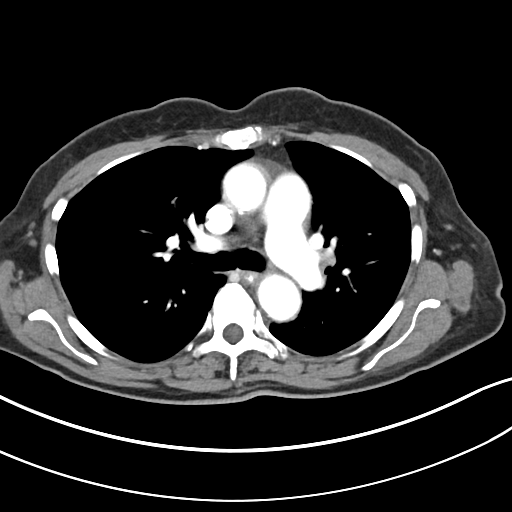
[im 101/121  bone]
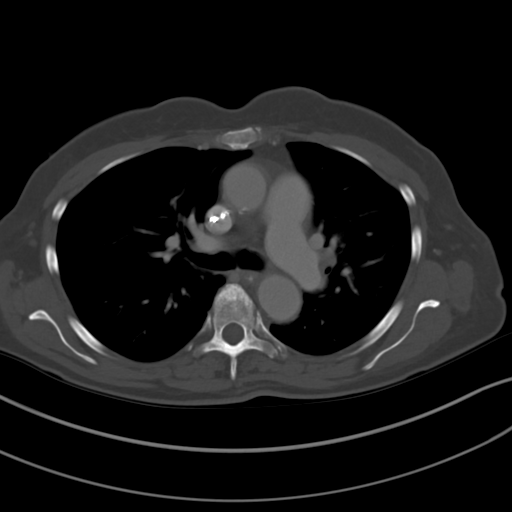
[im 111/121  soft-tissue]
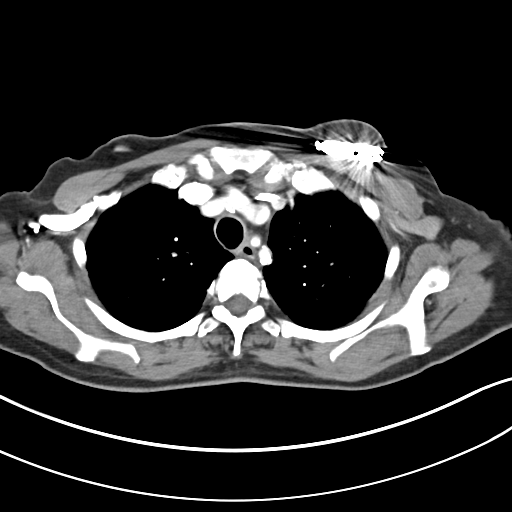

[Series 4: coronals cap 2.00 cor · coronal · 0.69mm/px · 3 of 126 slices shown]
[im 42/126  soft-tissue]
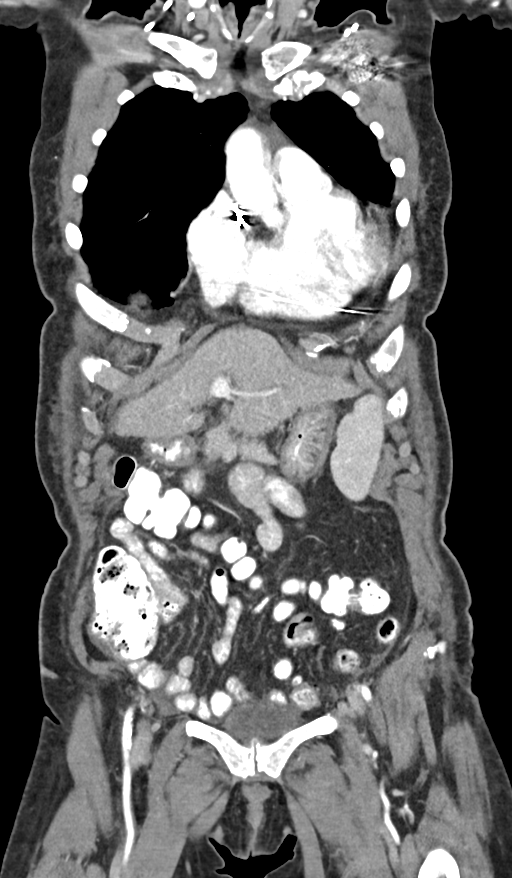
[im 56/126  soft-tissue]
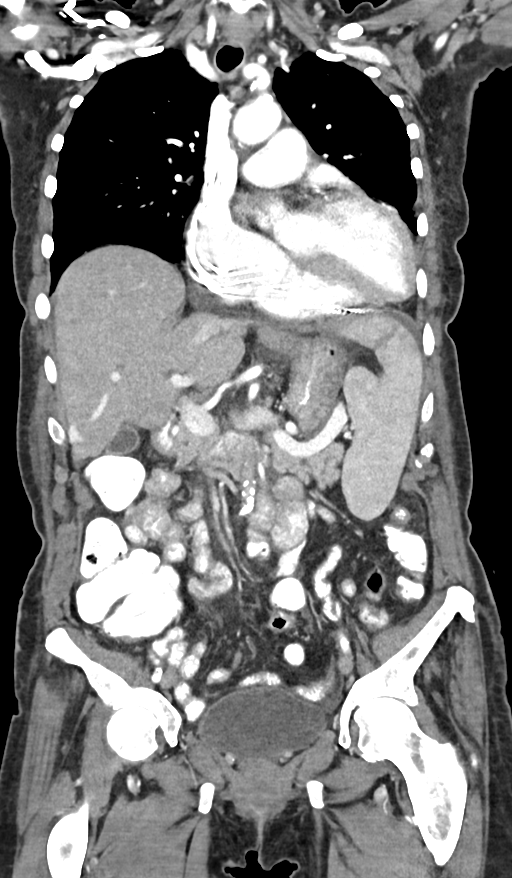
[im 70/126  soft-tissue]
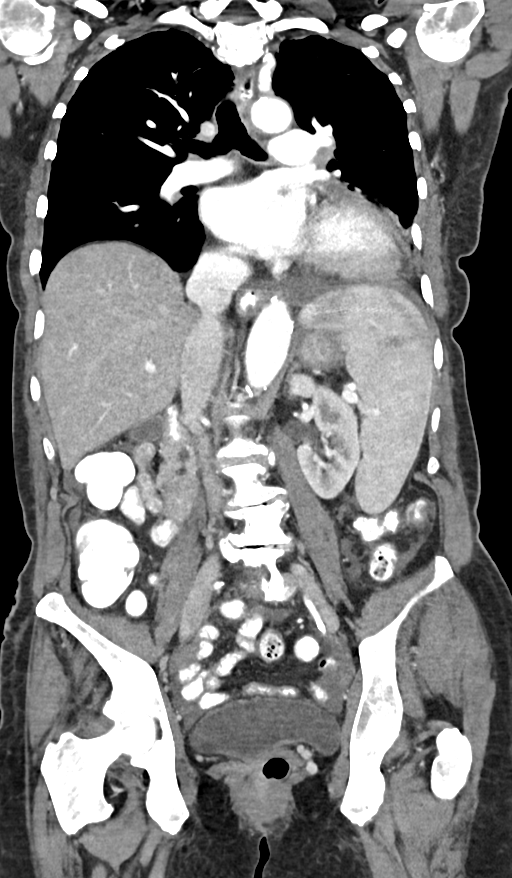

[13 of 46 positions shown; findings below may reference images not displayed]

FINDINGS: CT CHEST FINDINGS

Cardiovascular: Cardiac enlargement. The small to moderate posterior
pericardial effusion identified, image 111/6. Aortic
atherosclerosis. Lad and RCA coronary artery calcifications.

Mediastinum/Nodes: Thyroid gland not visualized and may be
surgically absent The trachea appears patent and is midline. Normal
appearance of the esophagus. No mediastinal, hilar, supraclavicular
or axillary adenopathy.

Lungs/Pleura: Paraseptal and centrilobular emphysema. No pleural
effusion. Bandlike area of fibrosis and masslike architectural
distortion within the perihilar left lung is again identified
compatible with changes due to external beam radiation. The
appearance is unchanged compared with the previous exam. No
suspicious pulmonary nodule or mass identified at this time.

Musculoskeletal: Healing left lateral rib deformities. Multi level
degenerative disc disease identified throughout the thoracic spine.

CT ABDOMEN PELVIS FINDINGS

Hepatobiliary: No focal liver abnormality. Gallbladder is
unremarkable. No biliary ductal dilatation.

Pancreas: Similar appearance of mild increase caliber of the main
duct. No inflammation. No mass

Spleen: The spleen measures 9.7 x 6.3 by 15.6 cm (volume = 500
cm^3). New linear, bandlike area of hypoenhancement within the
superior aspect of the spleen is identified, image 66/4 and is
concerning for a area of splenic laceration.

Adrenals/Urinary Tract: Normal appearance of the adrenal glands.
Right kidney cyst measures 1.7 cm. No mass or hydronephrosis. The
urinary bladder is unremarkable.

Stomach/Bowel: Stomach is within normal limits. Appendix appears
normal. No evidence of bowel wall thickening, distention, or
inflammatory changes.

Vascular/Lymphatic: Aortic atherosclerosis. No aneurysm. No
abdominopelvic adenopathy identified.

Reproductive: Status post hysterectomy. No adnexal masses.

Other: There is a small to moderate volume of free fluid within the
abdomen and pelvis. No discrete fluid collections.

Musculoskeletal: Scoliosis and degenerative disc disease. No
aggressive lytic or sclerotic bone lesions identified the at this
time.
IMPRESSION: 1. Stable changes of external beam radiation within the left lung
without findings to suggest Lung cancer recurrence within the chest.
2. Significant increase in volume of the spleen. New bandlike area
of low attenuation within the superior pole of spleen concerning for
with age indeterminate splenic laceration. Alternatively, findings
may reflect splenic infarct.
3. New small to moderate volume of free fluid within the abdomen and
pelvis. Etiology is indeterminate but in the setting of splenic
laceration could represent resolving hemoperitoneum.
4. Small pericardial effusion, new from previous study.
5. Healing left rib fractures.
6. Aortic Atherosclerosis (41G7O-AWJ.J) and Emphysema ([id number].R).

## 2020-03-01 IMAGING — DX DG CHEST 1V PORT
1 series · 1 of 1 positions shown · non-contrast
Comparison: CT of the chest on 07/22/2019 and prior chest x-ray on
06/18/2019

CLINICAL DATA: Weakness and diarrhea. History of lung carcinoma and
renal carcinoma.

EXAM:
PORTABLE CHEST 1 VIEW

[chest ap]
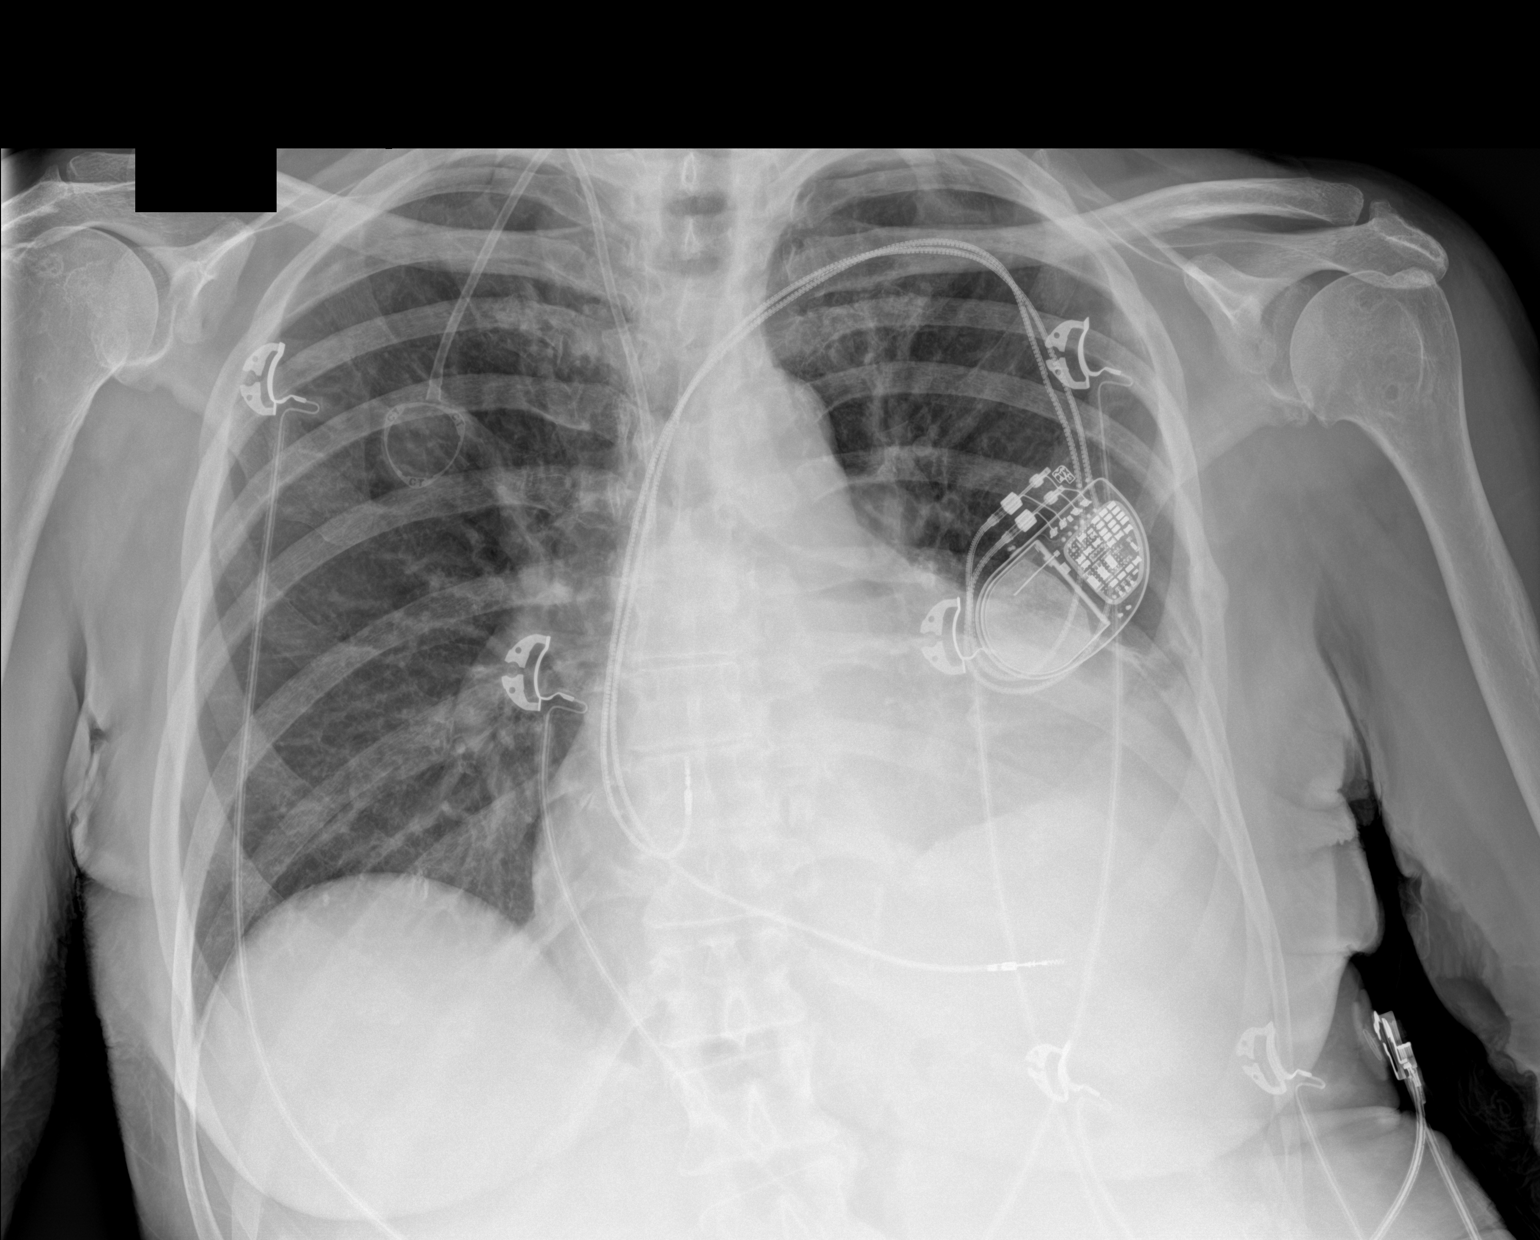

[1 of 1 positions shown; findings below may reference images not displayed]

FINDINGS: Stable cardiac enlargement and appearance of dual-chamber pacemaker.
Stable appearance of Port-A-Cath. Stable chronic atelectasis and
volume loss at the left lung base. There is no evidence of pulmonary
edema, consolidation, pneumothorax, nodule or pleural fluid. The
visualized skeletal structures are unremarkable.
IMPRESSION: Stable chest x-ray demonstrating stable cardiomegaly and chronic
atelectasis at the left lung base.
